# Patient Record
Sex: Male | Born: 1940
Health system: Southern US, Community
[De-identification: ages and names within clinical notes are randomized; demographics above are authoritative.]

## PROBLEM LIST (undated history)

## (undated) DIAGNOSIS — N390 Urinary tract infection, site not specified: Secondary | ICD-10-CM

## (undated) DIAGNOSIS — Z8719 Personal history of other diseases of the digestive system: Secondary | ICD-10-CM

## (undated) DIAGNOSIS — T4145XA Adverse effect of unspecified anesthetic, initial encounter: Secondary | ICD-10-CM

## (undated) DIAGNOSIS — C439 Malignant melanoma of skin, unspecified: Secondary | ICD-10-CM

## (undated) DIAGNOSIS — R636 Underweight: Secondary | ICD-10-CM

## (undated) DIAGNOSIS — H269 Unspecified cataract: Secondary | ICD-10-CM

## (undated) DIAGNOSIS — D649 Anemia, unspecified: Secondary | ICD-10-CM

## (undated) DIAGNOSIS — I499 Cardiac arrhythmia, unspecified: Secondary | ICD-10-CM

## (undated) DIAGNOSIS — H409 Unspecified glaucoma: Secondary | ICD-10-CM

## (undated) DIAGNOSIS — Z9889 Other specified postprocedural states: Secondary | ICD-10-CM

## (undated) DIAGNOSIS — E039 Hypothyroidism, unspecified: Secondary | ICD-10-CM

## (undated) DIAGNOSIS — C434 Malignant melanoma of scalp and neck: Secondary | ICD-10-CM

## (undated) DIAGNOSIS — R112 Nausea with vomiting, unspecified: Secondary | ICD-10-CM

## (undated) DIAGNOSIS — N189 Chronic kidney disease, unspecified: Secondary | ICD-10-CM

## (undated) DIAGNOSIS — I639 Cerebral infarction, unspecified: Secondary | ICD-10-CM

## (undated) DIAGNOSIS — E890 Postprocedural hypothyroidism: Secondary | ICD-10-CM

## (undated) DIAGNOSIS — K219 Gastro-esophageal reflux disease without esophagitis: Secondary | ICD-10-CM

## (undated) DIAGNOSIS — N139 Obstructive and reflux uropathy, unspecified: Secondary | ICD-10-CM

## (undated) DIAGNOSIS — T8859XA Other complications of anesthesia, initial encounter: Secondary | ICD-10-CM

## (undated) HISTORY — DX: Postprocedural hypothyroidism: E89.0

## (undated) HISTORY — PX: EYE SURGERY: SHX253

## (undated) HISTORY — PX: TONSILLECTOMY: SUR1361

## (undated) HISTORY — DX: Cerebral infarction, unspecified: I63.9

## (undated) HISTORY — DX: Underweight: R63.6

## (undated) HISTORY — PX: THYROIDECTOMY: SHX17

## (undated) HISTORY — DX: Urinary tract infection, site not specified: N39.0

## (undated) HISTORY — PX: CATARACT EXTRACTION: SUR2

## (undated) HISTORY — DX: Malignant melanoma of scalp and neck: C43.4

---

## 1997-11-25 ENCOUNTER — Emergency Department (HOSPITAL_COMMUNITY): Admission: EM | Admit: 1997-11-25 | Discharge: 1997-11-25 | Payer: Self-pay | Admitting: Emergency Medicine

## 1999-02-13 ENCOUNTER — Emergency Department (HOSPITAL_COMMUNITY): Admission: EM | Admit: 1999-02-13 | Discharge: 1999-02-13 | Payer: Self-pay | Admitting: Emergency Medicine

## 2003-04-19 ENCOUNTER — Emergency Department (HOSPITAL_COMMUNITY): Admission: EM | Admit: 2003-04-19 | Discharge: 2003-04-19 | Payer: Self-pay | Admitting: Emergency Medicine

## 2008-01-17 ENCOUNTER — Emergency Department (HOSPITAL_COMMUNITY): Admission: EM | Admit: 2008-01-17 | Discharge: 2008-01-17 | Payer: Self-pay | Admitting: Emergency Medicine

## 2008-08-02 ENCOUNTER — Emergency Department (HOSPITAL_COMMUNITY): Admission: EM | Admit: 2008-08-02 | Discharge: 2008-08-02 | Payer: Self-pay | Admitting: Emergency Medicine

## 2010-05-02 LAB — URINALYSIS, ROUTINE W REFLEX MICROSCOPIC
Bilirubin Urine: NEGATIVE
Glucose, UA: NEGATIVE mg/dL
Ketones, ur: NEGATIVE mg/dL
Nitrite: NEGATIVE
Protein, ur: 100 mg/dL — AB
Specific Gravity, Urine: 1.012 (ref 1.005–1.030)
Urobilinogen, UA: 0.2 mg/dL (ref 0.0–1.0)
pH: 6 (ref 5.0–8.0)

## 2010-05-02 LAB — URINE CULTURE
Colony Count: NO GROWTH
Culture: NO GROWTH

## 2010-05-02 LAB — URINE MICROSCOPIC-ADD ON

## 2010-06-08 ENCOUNTER — Inpatient Hospital Stay (HOSPITAL_COMMUNITY)
Admission: EM | Admit: 2010-06-08 | Discharge: 2010-06-12 | DRG: 683 | Disposition: A | Discharge status: Home Health | Payer: Medicare Other | Attending: Internal Medicine | Admitting: Internal Medicine

## 2010-06-08 DIAGNOSIS — E039 Hypothyroidism, unspecified: Secondary | ICD-10-CM | POA: Diagnosis present

## 2010-06-08 DIAGNOSIS — R634 Abnormal weight loss: Secondary | ICD-10-CM | POA: Diagnosis present

## 2010-06-08 DIAGNOSIS — N189 Chronic kidney disease, unspecified: Secondary | ICD-10-CM | POA: Diagnosis present

## 2010-06-08 DIAGNOSIS — N179 Acute kidney failure, unspecified: Principal | ICD-10-CM | POA: Diagnosis present

## 2010-06-08 DIAGNOSIS — D62 Acute posthemorrhagic anemia: Secondary | ICD-10-CM | POA: Diagnosis present

## 2010-06-08 DIAGNOSIS — Z880 Allergy status to penicillin: Secondary | ICD-10-CM

## 2010-06-08 DIAGNOSIS — E872 Acidosis, unspecified: Secondary | ICD-10-CM | POA: Diagnosis present

## 2010-06-08 DIAGNOSIS — N390 Urinary tract infection, site not specified: Secondary | ICD-10-CM | POA: Diagnosis present

## 2010-06-08 DIAGNOSIS — N139 Obstructive and reflux uropathy, unspecified: Secondary | ICD-10-CM | POA: Diagnosis present

## 2010-06-08 DIAGNOSIS — D509 Iron deficiency anemia, unspecified: Secondary | ICD-10-CM | POA: Diagnosis present

## 2010-06-09 ENCOUNTER — Inpatient Hospital Stay (HOSPITAL_COMMUNITY): Payer: Medicare Other

## 2010-06-09 ENCOUNTER — Other Ambulatory Visit: Payer: Self-pay | Admitting: Internal Medicine

## 2010-06-09 LAB — BASIC METABOLIC PANEL
BUN: 38 mg/dL — ABNORMAL HIGH (ref 6–23)
BUN: 37 mg/dL — ABNORMAL HIGH (ref 6–23)
BUN: 38 mg/dL — ABNORMAL HIGH (ref 6–23)
CO2: 18 mEq/L — ABNORMAL LOW (ref 19–32)
CO2: 19 mEq/L (ref 19–32)
CO2: 19 mEq/L (ref 19–32)
Calcium: 8.6 mg/dL (ref 8.4–10.5)
Calcium: 8.3 mg/dL — ABNORMAL LOW (ref 8.4–10.5)
Calcium: 8.2 mg/dL — ABNORMAL LOW (ref 8.4–10.5)
Chloride: 107 mEq/L (ref 96–112)
Chloride: 111 mEq/L (ref 96–112)
Chloride: 112 mEq/L (ref 96–112)
Creatinine, Ser: 4.06 mg/dL — ABNORMAL HIGH (ref 0.4–1.5)
Creatinine, Ser: 3.98 mg/dL — ABNORMAL HIGH (ref 0.4–1.5)
Creatinine, Ser: 4.07 mg/dL — ABNORMAL HIGH (ref 0.4–1.5)
GFR calc Af Amer: 18 mL/min — ABNORMAL LOW (ref >60)
GFR calc Af Amer: 18 mL/min — ABNORMAL LOW (ref >60)
GFR calc Af Amer: 18 mL/min — ABNORMAL LOW (ref >60)
GFR calc non Af Amer: 15 mL/min — ABNORMAL LOW (ref >60)
GFR calc non Af Amer: 15 mL/min — ABNORMAL LOW (ref >60)
GFR calc non Af Amer: 15 mL/min — ABNORMAL LOW (ref >60)
Glucose, Bld: 111 mg/dL — ABNORMAL HIGH (ref 70–99)
Glucose, Bld: 110 mg/dL — ABNORMAL HIGH (ref 70–99)
Glucose, Bld: 174 mg/dL — ABNORMAL HIGH (ref 70–99)
Potassium: 4.8 mEq/L (ref 3.5–5.1)
Potassium: 4.5 mEq/L (ref 3.5–5.1)
Potassium: 4.2 mEq/L (ref 3.5–5.1)
Sodium: 136 mEq/L (ref 135–145)
Sodium: 138 mEq/L (ref 135–145)
Sodium: 140 mEq/L (ref 135–145)

## 2010-06-09 LAB — LIPID PANEL
Cholesterol: 112 mg/dL (ref 0–200)
HDL: 26 mg/dL — ABNORMAL LOW (ref >39)
LDL Cholesterol: 64 mg/dL (ref 0–99)
Total CHOL/HDL Ratio: 4.3 RATIO
Triglycerides: 108 mg/dL (ref <150)
VLDL: 22 mg/dL (ref 0–40)

## 2010-06-09 LAB — IRON AND TIBC
Iron: <10 ug/dL — ABNORMAL LOW (ref 42–135)
UIBC: 203 ug/dL

## 2010-06-09 LAB — CREATININE, URINE, RANDOM: Creatinine, Urine: 63.95 mg/dL

## 2010-06-09 LAB — CBC
HCT: 26.5 % — ABNORMAL LOW (ref 39.0–52.0)
HCT: 23.9 % — ABNORMAL LOW (ref 39.0–52.0)
Hemoglobin: 8.3 g/dL — ABNORMAL LOW (ref 13.0–17.0)
Hemoglobin: 7.6 g/dL — ABNORMAL LOW (ref 13.0–17.0)
MCH: 27.3 pg (ref 26.0–34.0)
MCH: 27.6 pg (ref 26.0–34.0)
MCHC: 31.3 g/dL (ref 30.0–36.0)
MCHC: 31.8 g/dL (ref 30.0–36.0)
MCV: 87.2 fL (ref 78.0–100.0)
MCV: 86.9 fL (ref 78.0–100.0)
Platelets: 219 10*3/uL (ref 150–400)
Platelets: 177 10*3/uL (ref 150–400)
RBC: 3.04 MIL/uL — ABNORMAL LOW (ref 4.22–5.81)
RBC: 2.75 MIL/uL — ABNORMAL LOW (ref 4.22–5.81)
RDW: 14.7 % (ref 11.5–15.5)
RDW: 14.7 % (ref 11.5–15.5)
WBC: 7.7 10*3/uL (ref 4.0–10.5)
WBC: 6.7 10*3/uL (ref 4.0–10.5)

## 2010-06-09 LAB — DIFFERENTIAL
Basophils Absolute: 0 10*3/uL (ref 0.0–0.1)
Basophils Relative: 0 % (ref 0–1)
Eosinophils Absolute: 0.1 10*3/uL (ref 0.0–0.7)
Eosinophils Relative: 2 % (ref 0–5)
Lymphocytes Relative: 16 % (ref 12–46)
Lymphs Abs: 1.2 10*3/uL (ref 0.7–4.0)
Monocytes Absolute: 0.7 10*3/uL (ref 0.1–1.0)
Monocytes Relative: 9 % (ref 3–12)
Neutro Abs: 5.6 10*3/uL (ref 1.7–7.7)
Neutrophils Relative %: 73 % (ref 43–77)

## 2010-06-09 LAB — GLUCOSE, CAPILLARY
Glucose-Capillary: 99 mg/dL (ref 70–99)
Glucose-Capillary: 97 mg/dL (ref 70–99)
Glucose-Capillary: 97 mg/dL (ref 70–99)

## 2010-06-09 LAB — URINE MICROSCOPIC-ADD ON

## 2010-06-09 LAB — URINALYSIS, ROUTINE W REFLEX MICROSCOPIC
Bilirubin Urine: NEGATIVE
Glucose, UA: NEGATIVE mg/dL
Ketones, ur: NEGATIVE mg/dL
Nitrite: NEGATIVE
Protein, ur: >300 mg/dL — AB
Specific Gravity, Urine: 1.017 (ref 1.005–1.030)
Urobilinogen, UA: 0.2 mg/dL (ref 0.0–1.0)
pH: 6.5 (ref 5.0–8.0)

## 2010-06-09 LAB — HEPATIC FUNCTION PANEL
ALT: 7 U/L (ref 0–53)
AST: 11 U/L (ref 0–37)
Albumin: 2.9 g/dL — ABNORMAL LOW (ref 3.5–5.2)
Alkaline Phosphatase: 77 U/L (ref 39–117)
Bilirubin, Direct: <0.1 mg/dL (ref 0.0–0.3)
Total Bilirubin: 0.1 mg/dL — ABNORMAL LOW (ref 0.3–1.2)
Total Protein: 6.8 g/dL (ref 6.0–8.3)

## 2010-06-09 LAB — TSH: TSH: 5.556 u[IU]/mL — ABNORMAL HIGH (ref 0.350–4.500)

## 2010-06-09 LAB — MAGNESIUM: Magnesium: 1.8 mg/dL (ref 1.5–2.5)

## 2010-06-09 LAB — PHOSPHORUS: Phosphorus: 4.2 mg/dL (ref 2.3–4.6)

## 2010-06-09 LAB — FOLATE: Folate: 17.8 ng/mL

## 2010-06-09 LAB — VITAMIN B12: Vitamin B-12: 377 pg/mL (ref 211–911)

## 2010-06-09 LAB — ABO/RH: ABO/RH(D): O POS

## 2010-06-09 LAB — FERRITIN: Ferritin: 357 ng/mL — ABNORMAL HIGH (ref 22–322)

## 2010-06-09 LAB — SODIUM, URINE, RANDOM: Sodium, Ur: 93 mEq/L

## 2010-06-09 NOTE — H&P (Signed)
NAMETOBEY, CAPELLA NO.:  [id number]  MEDICAL RECORD NO.:  [id number]           PATIENT TYPE:  E  LOCATION:  [facility]                         FACILITY:  [facility]  PHYSICIAN:  Rise Patience, MDDATE OF BIRTH:  [birth year]  DATE OF ADMISSION:  06/08/2010 DATE OF DISCHARGE:                             HISTORY & PHYSICAL   PRIMARY CARE PHYSICIAN:  Unassigned.  The patient does not have one.  CHIEF COMPLAINT:  Urinating blood.  HISTORY OF PRESENT ILLNESS:  A 70 year old male with no significant past medical history, was doing fine and until after his work around evening he started urinating blood.  He says it is frank blood and later on he had some clots.  Denies any abdominal pain.  Denies any nausea, vomiting, or diarrhea, has no blood in stools or black stools.  He denies any chest pain or dizziness or loss of consciousness.  He did have a fall yesterday after he tried to cut the limb of tree, but he stated he did not lose consciousness or did not lose consciousness or did not hurt himself.  At this time in the ER, he was found to have a hemoglobin of around 8.3, normocytic normochromic anemia with creatinine of 4 we do not have any baseline creatinine.  At this time, the patient is admitted for further workup.  The patient denies any chest pain or shortness of breath.  He denies any cough or phlegm.  Denies any headache or visual symptoms.  Denies any focal deficit.  The patient stated he does not use any medication including any NSAIDs or any Goody Powder.Marland Kitchen  PAST MEDICAL HISTORY:  Nothing significant.  PAST SURGICAL HISTORY:  None.  MEDICATIONS ON ADMISSION:  None.  SOCIAL HISTORY:  The patient was a former smoker.  Denies any alcohol or drug abuse.  FAMILY HISTORY:  Significant for lung cancer in his dad.  REVIEW OF SYSTEMS:  As per history of present illness nothing else significant.  PHYSICAL EXAMINATION:  GENERAL:  The patient examined at  bedside.  Not in acute distress. VITAL SIGNS:  Blood pressure is 150/60, pulse is 80 per minute, temperature 98, respirations 18, O2 sat is 100% HEENT: Anicteric.  No pallor.  No discharge from ears, eyes, nose or mouth. CHEST:  Bilateral air entry present.  No rhonchi.  No crepitation. HEART:  S1 and S2. ABDOMEN:  Soft, nontender.  Bowel sounds heard.  I do not see any organomegaly.  No discoloration. CNS:  Alert, awake, oriented to time, place, and person. EXTREMITIES:  Moves upper and lower extremities 5/5.  Peripheral pulses felt.  No edema.  LABORATORY DATA:  CBC:  WBC 7.7, hemoglobin 8.3, hematocrit 26.5, platelets 219.  Basic metabolic panel sodium [id number] potassium 4.8, chloride 101, carbon dioxide 18, anion gap is 11, glucose 11, BUN 38, creatinine 4, calcium 8.6.  UA shows large blood, protein more than 300, nitrites negative, leukocytes large, wbc too numerous to count, rbc too numerous to count bacteria few.  ASSESSMENT: 1. Hematuria. 2. Renal failure probably acute on Chronic 3. Non anion gap metabolic acidosis, there was mildly elevated blood  pressure. 4. Normocytic normochromic anemia.  PLAN:  At this time admit the patient to telemetry:. 1. For his renal failure, at this time we will gently hydrate     the patient. We will get urine sodium, urine creatinine, and urine     eosinophil.  At this time, his urine does not show any cast.  I am     going to get a CT of abdomen and pelvis without contrast to look in     for any renal mass or any other pathologies which may also explain     hematuria.If creatinine does not improve we may consult nephrology.     We will place patient on strict intake output and dailyweight. 2. For hematuria, at this time we are going to get urine culture.  The     patient does have some symptoms of frequency.  I am going to place     the patient on ceftriaxone.  The patient may need Urology consult     eventually. 3. Mildly elevated  blood pressure and we will keep the patient on     p.r.n. hydralazine at this time. 4. Further recommendation based on test order and clinical course.     Rise Patience, MD     ANK/MEDQ  D:  06/09/2010  T:  06/09/2010  Job:  [id number]  Electronically Signed by Gean Birchwood MD on 06/09/2010 06:16:58 AM

## 2010-06-09 NOTE — H&P (Signed)
  [name], Keith [name] NO.:  [id number]  MEDICAL RECORD NO.:  [id number]           PATIENT TYPE:  E  LOCATION:  [facility]                         FACILITY:  [facility]  PHYSICIAN:  Rise Patience, MDDATE OF BIRTH:  27-Jul-1940  DATE OF ADMISSION:  06/08/2010 DATE OF DISCHARGE:                             HISTORY & PHYSICAL   ADDENDUM  In addition, the patient also has anemia at this time and anemia is normocytic, normochromic.  The patient denies any black stools or having any blood in the stools or any nausea, vomiting.  The anemia is normocytic, normochromic, probably could be acute and given his gross hematuria, could be a cause of it, but we will check anemia panel, stool for occult blood.  I am going to type and cross match 2 units PRBC and hold.     Rise Patience, MD     ANK/MEDQ  D:  06/09/2010  T:  06/09/2010  Job:  [id number]  Electronically Signed by Gean Birchwood MD on 06/09/2010 06:17:25 AM

## 2010-06-10 ENCOUNTER — Other Ambulatory Visit (HOSPITAL_COMMUNITY): Payer: Medicare Other

## 2010-06-10 LAB — RENAL FUNCTION PANEL
Albumin: 2.8 g/dL — ABNORMAL LOW (ref 3.5–5.2)
BUN: 36 mg/dL — ABNORMAL HIGH (ref 6–23)
CO2: 19 mEq/L (ref 19–32)
Calcium: 8.5 mg/dL (ref 8.4–10.5)
Chloride: 114 mEq/L — ABNORMAL HIGH (ref 96–112)
Creatinine, Ser: 3.87 mg/dL — ABNORMAL HIGH (ref 0.4–1.5)
GFR calc Af Amer: 19 mL/min — ABNORMAL LOW (ref >60)
GFR calc non Af Amer: 16 mL/min — ABNORMAL LOW (ref >60)
Glucose, Bld: 82 mg/dL (ref 70–99)
Phosphorus: 4.3 mg/dL (ref 2.3–4.6)
Potassium: 4.6 mEq/L (ref 3.5–5.1)
Sodium: 141 mEq/L (ref 135–145)

## 2010-06-10 LAB — GLUCOSE, CAPILLARY
Glucose-Capillary: 102 mg/dL — ABNORMAL HIGH (ref 70–99)
Glucose-Capillary: 113 mg/dL — ABNORMAL HIGH (ref 70–99)
Glucose-Capillary: 116 mg/dL — ABNORMAL HIGH (ref 70–99)
Glucose-Capillary: 138 mg/dL — ABNORMAL HIGH (ref 70–99)

## 2010-06-10 LAB — CBC
HCT: 27.6 % — ABNORMAL LOW (ref 39.0–52.0)
Hemoglobin: 8.9 g/dL — ABNORMAL LOW (ref 13.0–17.0)
MCH: 27.7 pg (ref 26.0–34.0)
MCHC: 32.2 g/dL (ref 30.0–36.0)
MCV: 86 fL (ref 78.0–100.0)
Platelets: 152 10*3/uL (ref 150–400)
RBC: 3.21 MIL/uL — ABNORMAL LOW (ref 4.22–5.81)
RDW: 15.2 % (ref 11.5–15.5)
WBC: 6.2 10*3/uL (ref 4.0–10.5)

## 2010-06-10 LAB — URINE CULTURE
Colony Count: NO GROWTH
Culture  Setup Time: 201205241013
Culture: NO GROWTH

## 2010-06-11 LAB — CBC
HCT: 28.8 % — ABNORMAL LOW (ref 39.0–52.0)
Hemoglobin: 9.4 g/dL — ABNORMAL LOW (ref 13.0–17.0)
MCH: 28 pg (ref 26.0–34.0)
MCHC: 32.6 g/dL (ref 30.0–36.0)
MCV: 85.7 fL (ref 78.0–100.0)
Platelets: 157 10*3/uL (ref 150–400)
RBC: 3.36 MIL/uL — ABNORMAL LOW (ref 4.22–5.81)
RDW: 14.9 % (ref 11.5–15.5)
WBC: 6.6 10*3/uL (ref 4.0–10.5)

## 2010-06-11 LAB — RENAL FUNCTION PANEL
Albumin: 2.5 g/dL — ABNORMAL LOW (ref 3.5–5.2)
BUN: 31 mg/dL — ABNORMAL HIGH (ref 6–23)
CO2: 32 mEq/L (ref 19–32)
Calcium: 7.8 mg/dL — ABNORMAL LOW (ref 8.4–10.5)
Chloride: 103 mEq/L (ref 96–112)
Creatinine, Ser: 3.45 mg/dL — ABNORMAL HIGH (ref 0.4–1.5)
GFR calc Af Amer: 22 mL/min — ABNORMAL LOW (ref >60)
GFR calc non Af Amer: 18 mL/min — ABNORMAL LOW (ref >60)
Glucose, Bld: 108 mg/dL — ABNORMAL HIGH (ref 70–99)
Phosphorus: 3.6 mg/dL (ref 2.3–4.6)
Potassium: 4.1 mEq/L (ref 3.5–5.1)
Sodium: 141 mEq/L (ref 135–145)

## 2010-06-11 LAB — CROSSMATCH
ABO/RH(D): O POS
Antibody Screen: NEGATIVE
Unit division: 0
Unit division: 0

## 2010-06-11 LAB — DIFFERENTIAL
Basophils Absolute: 0 10*3/uL (ref 0.0–0.1)
Basophils Relative: 0 % (ref 0–1)
Eosinophils Absolute: 0.2 10*3/uL (ref 0.0–0.7)
Eosinophils Relative: 2 % (ref 0–5)
Lymphocytes Relative: 21 % (ref 12–46)
Lymphs Abs: 1.4 10*3/uL (ref 0.7–4.0)
Monocytes Absolute: 0.8 10*3/uL (ref 0.1–1.0)
Monocytes Relative: 12 % (ref 3–12)
Neutro Abs: 4.3 10*3/uL (ref 1.7–7.7)
Neutrophils Relative %: 65 % (ref 43–77)

## 2010-06-11 LAB — GLUCOSE, CAPILLARY
Glucose-Capillary: 116 mg/dL — ABNORMAL HIGH (ref 70–99)
Glucose-Capillary: 117 mg/dL — ABNORMAL HIGH (ref 70–99)
Glucose-Capillary: 102 mg/dL — ABNORMAL HIGH (ref 70–99)
Glucose-Capillary: 102 mg/dL — ABNORMAL HIGH (ref 70–99)

## 2010-06-12 ENCOUNTER — Inpatient Hospital Stay (HOSPITAL_COMMUNITY): Payer: Medicare Other

## 2010-06-12 LAB — BASIC METABOLIC PANEL
BUN: 27 mg/dL — ABNORMAL HIGH (ref 6–23)
CO2: 26 mEq/L (ref 19–32)
Calcium: 7.4 mg/dL — ABNORMAL LOW (ref 8.4–10.5)
Chloride: 105 mEq/L (ref 96–112)
Creatinine, Ser: 3.13 mg/dL — ABNORMAL HIGH (ref 0.4–1.5)
GFR calc Af Amer: 24 mL/min — ABNORMAL LOW (ref >60)
GFR calc non Af Amer: 20 mL/min — ABNORMAL LOW (ref >60)
Glucose, Bld: 86 mg/dL (ref 70–99)
Potassium: 3.9 mEq/L (ref 3.5–5.1)
Sodium: 140 mEq/L (ref 135–145)

## 2010-06-12 LAB — GLUCOSE, CAPILLARY
Glucose-Capillary: 93 mg/dL (ref 70–99)
Glucose-Capillary: 88 mg/dL (ref 70–99)

## 2010-06-12 LAB — FREE PSA
PSA, Free Pct: 37 % (ref >25)
PSA, Free: 0.3 ng/mL

## 2010-06-12 LAB — PSA: PSA: 0.82 ng/mL (ref <=4.00)

## 2010-06-14 LAB — CK ISOENZYMES
CK-BB: 0 %
CK-MB: 0 % (ref <5)
CK-MM: 100 % (ref 95–100)
Total CK: 67 U/L (ref 7–232)

## 2010-06-15 ENCOUNTER — Other Ambulatory Visit: Payer: Self-pay | Admitting: Family Medicine

## 2010-06-15 DIAGNOSIS — R9389 Abnormal findings on diagnostic imaging of other specified body structures: Secondary | ICD-10-CM

## 2010-06-17 ENCOUNTER — Inpatient Hospital Stay
Admission: RE | Admit: 2010-06-17 | Discharge: 2010-06-17 | Payer: Medicare Other | Source: Ambulatory Visit | Attending: Family Medicine | Admitting: Family Medicine

## 2010-06-20 ENCOUNTER — Ambulatory Visit (HOSPITAL_COMMUNITY)
Admission: RE | Admit: 2010-06-20 | Discharge: 2010-06-20 | Disposition: A | Discharge status: Home / Self Care | Payer: Medicare Other | Source: Ambulatory Visit | Attending: Urology | Admitting: Urology

## 2010-06-20 DIAGNOSIS — N320 Bladder-neck obstruction: Secondary | ICD-10-CM | POA: Insufficient documentation

## 2010-06-20 DIAGNOSIS — N289 Disorder of kidney and ureter, unspecified: Secondary | ICD-10-CM | POA: Insufficient documentation

## 2010-06-20 DIAGNOSIS — R339 Retention of urine, unspecified: Secondary | ICD-10-CM | POA: Insufficient documentation

## 2010-06-20 DIAGNOSIS — N1330 Unspecified hydronephrosis: Secondary | ICD-10-CM | POA: Insufficient documentation

## 2010-06-20 DIAGNOSIS — R319 Hematuria, unspecified: Secondary | ICD-10-CM | POA: Insufficient documentation

## 2010-06-20 DIAGNOSIS — Z79899 Other long term (current) drug therapy: Secondary | ICD-10-CM | POA: Insufficient documentation

## 2010-06-20 DIAGNOSIS — K219 Gastro-esophageal reflux disease without esophagitis: Secondary | ICD-10-CM | POA: Insufficient documentation

## 2010-06-20 LAB — BASIC METABOLIC PANEL
BUN: 31 mg/dL — ABNORMAL HIGH (ref 6–23)
CO2: 23 mEq/L (ref 19–32)
Calcium: 9 mg/dL (ref 8.4–10.5)
Chloride: 106 mEq/L (ref 96–112)
Creatinine, Ser: 2.58 mg/dL — ABNORMAL HIGH (ref 0.4–1.5)
GFR calc Af Amer: 30 mL/min — ABNORMAL LOW (ref >60)
GFR calc non Af Amer: 25 mL/min — ABNORMAL LOW (ref >60)
Glucose, Bld: 91 mg/dL (ref 70–99)
Potassium: 5.2 mEq/L — ABNORMAL HIGH (ref 3.5–5.1)
Sodium: 137 mEq/L (ref 135–145)

## 2010-06-20 LAB — SURGICAL PCR SCREEN
MRSA, PCR: NEGATIVE
Staphylococcus aureus: NEGATIVE

## 2010-06-20 NOTE — Consult Note (Signed)
NAMELANDON, Keith Keith [name].:  [id number]  MEDICAL RECORD Keith [name].:  [id number]           PATIENT TYPE:  I  LOCATION:  [id number]                         FACILITY:  [facility]  PHYSICIAN:  Keith Bring, MD      DATE OF BIRTH:  07/09/1940  DATE OF CONSULTATION:  06/09/2010 DATE OF DISCHARGE:                                CONSULTATION   REASON FOR CONSULTATION:  Gross hematuria and renal dysfunction.  PHYSICIAN REQUESTING CONSULTATION:  Dr. Grandville Silos with [facility].  HISTORY:  [name] is a 70 year old gentleman who developed painless gross hematuria last evening, which caused him to present to the emergency department.  He denies any significant prior history of hematuria except approximately 2 years ago when he was diagnosed and treated with a urinary tract infection.  He states that he does not have any longstanding bothersome urinary complaints, although had noted some nocturnal incontinence over the past 3-4 weeks, which was of new onset. He otherwise has been quite healthy and does not have a regular physician and has not seen a physician in many years.  He personally denies a history of GU malignancy and has Keith [name] family history of GU malignancy.  He did smoke one pack of cigarettes per day for 20 years and quit in his mid 82s.  He denies a history of prior urolithiasis, STDs, prostatitis, GU malignancy, or trauma.  He does have a history of a partial left orchiectomy, which was apparently performed due to infectious causes back in the 1970s.  PAST MEDICAL HISTORY:  None.  PAST SURGICAL HISTORY: 1. Left partial orchiectomy. 2. Foot surgery.  MEDICATIONS:  Keith [name] home medications.  ALLERGIES:  PENICILLIN.  FAMILY HISTORY:  Keith [name] GU malignancy.  SOCIAL HISTORY:  He did smoke one pack of cigarettes per day for 20 years and quit in his early 36s.  He denies alcohol use.  REVIEW OF SYSTEMS:  Complete review of systems was performed.  All pertinent findings are as  noted in the history of present illness.  All other systems are reviewed and are negative.  PHYSICAL EXAMINATION:  VITAL SIGNS:  Temperature 97.7, pulse 71, blood pressure 125/49. CONSTITUTIONAL:  Well-nourished, well-developed, age-appropriate male in Keith [name] acute distress. HEENT:  Normocephalic, atraumatic. NECK:  Supple without lymphadenopathy or JVD. CARDIOVASCULAR:  Regular rate and rhythm. LUNGS:  Clear bilaterally. ABDOMEN:  Soft, nontender, nondistended without abdominal masses or bruits. BACK:  Keith [name] CVA tenderness. GU:  Normal circumcised male phallus with an indwelling Foley catheter with grossly bloody urine.  Testes are descended bilaterally and are nontender without masses. DRE:  Normal sphincter tone.  Prostate is approximately 60 g without discrete nodularity.  There is some mild induration toward the left base of the prostate medially. EXTREMITIES:  Keith [name] edema. NEUROLOGIC:  Grossly intact.  LABORATORY DATA:  Serum creatinine on admission was 4.06 with an unknown baseline creatinine.  A repeat creatinine earlier this morning was 3.98. Both of these levels were performed prior to catheter placement. Hemoglobin 7.6.  He has received 2 units of packed red blood cells.  RADIOLOGIC IMAGING:  I independently reviewed his CT scan of the  abdomen and pelvis without contrast.  He does have bilateral hydronephrosis with a distended bladder consistent with bladder outlet obstruction.  He also has evidence of a median prostatic lobes.  There is some mild periaortic lymphadenopathy with a left periaortic lymph node measuring 12 mm and another periaortic lymph node measuring 11 mm, which is nonspecific, but slightly pathologically enlarged.  He does not have any evidence of urolithiasis or other explanations for his hematuria.  IMPRESSION AND RECOMMENDATIONS: 1. Gross hematuria:  I am unsure of the etiology of his hematuria at     this point.  He will require further evaluation, which  may include     contrasted imaging depending on whether his renal function improves     following catheter placement.  Otherwise, he may require retrograde     pyelography to complete his upper tract evaluation.  He also will     require cystoscopy either in the office or possibly in the     operative setting depending on whether his urine clears with     conservative management.  While his anemia certainly may be related     to his gross hematuria, this would certainly be unusual especially     considering the acute onset.  I would certainly consider other     causes for his anemia, such as chronic kidney disease. 2. Renal dysfunction:  I am unsure whether this is chronic or acute.     His renal dysfunction certainly may be related to bladder outlet     obstruction based on his presentation, and his renal function     should be monitored with his catheter now in place leaving his     bladder outlet obstruction.  He also should be monitored for a     possible postobstructive diuresis.     Keith Bring, MD     LB/MEDQ  D:  06/09/2010  T:  06/10/2010  Job:  [id number]  cc:   Rise Patience, MD  Electronically Signed by Keith Bring MD on 06/20/2010 08:09:19 PM

## 2010-06-21 ENCOUNTER — Other Ambulatory Visit: Payer: Medicare Other

## 2010-06-22 NOTE — Op Note (Signed)
Keith [name], MACIOCE NO.:  [id number]  MEDICAL RECORD NO.:  [id number]  LOCATION:  DAY                          FACILITY:  [facility]  PHYSICIAN:  Raynelle Bring, MD      DATE OF BIRTH:  01-Mar-1940  DATE OF PROCEDURE:  06/20/2010 DATE OF DISCHARGE:                              OPERATIVE REPORT   PREOPERATIVE DIAGNOSES: 1. Hematuria. 2. Renal insufficiency. 3. Bilateral hydronephrosis. 4. Urinary retention.  POSTOPERATIVE DIAGNOSES: 1. Hematuria. 2. Renal insufficiency. 3. Bilateral hydronephrosis. 4. Urinary retention.  PROCEDURES: 1. Cystoscopy. 2. Bilateral retrograde pyelography with interpretation. 3. Bilateral ureteral stent placement (right 6 x 28, left 6 x 26).  INTRAOPERATIVE FINDINGS:  Bilateral retrograde pyelography demonstrated bilateral hydroureteronephrosis down to the level of the ureterovesical junctions.  There was no evidence of any filling defects or other abnormalities.  SURGEON:  Raynelle Bring, MD  ANESTHESIA:  General.  COMPLICATIONS:  None.  INDICATION:  Mr. Boulet is a 70 year old gentleman who was recently found to have gross hematuria and admitted to the hospital and also found to have renal insufficiency with a creatinine of 4.  No baseline creatinine was available due to the fact that the patient had not received medical care over a decade.  His creatinine improved gradually with Foley catheter placement, although nadired at about 2.7.  Repeat upper tract imaging demonstrated persistent bilateral hydronephrosis. His initial evaluation did not reveal any obvious cause for his hematuria and specifically no evidence of urolithiasis or bladder tumors.  Due to the fact that he had persistent hydronephrosis, it was recommended that he proceed to the operating room for the above procedures and further evaluation of possible upper tract obstruction. The potential risks, complications, and alternative treatment  options associated with the above procedures were discussed in detail and informed consent obtained.  DESCRIPTION OF PROCEDURE:  The patient was taken to the operating room and a general anesthetic was administered.  He was given preoperative antibiotics, placed in the dorsal lithotomy position, and prepped and draped in the usual sterile fashion.  Next, preoperative time-out was performed.  Cystourethroscopy was performed which revealed a normal anterior urethra.  The prostatic urethra was notable for a high bladder neck as well as lateral lobe hypertrophy and the prostatic urethra was noted to be significantly friable with fairly profuse bleeding upon entering the bladder.  However, there were no bleeding sites within the bladder.  There was expected edema throughout the bladder, consistent with his history of an indwelling Foley catheter.  No bladder tumors or other abnormalities were identified.  Of note, his cytology from the office revealed no malignant cells.  The ureteral orifices were then identified in their expected anatomic location.  The left ureteral orifice was intubated with a 6-French ureteral catheter and Omnipaque contrast was injected.  This revealed a dilated ureter and dilated renal collecting system, consistent with obstruction at the ureterovesical junction without filling defects or other abnormalities.  A 0.038 Glidewire was then able to be manipulated up into the left renal collecting system under fluoroscopic guidance.  A 6-French ureteral catheter was placed up into the renal pelvis and again Omnipaque contrast was injected which further confirmed no evidence  of any filling defects or other abnormalities except for dilation of the renal pelvis and caliceal system.  A 0.038 sensor guidewire was then advanced through the ureteral catheter into the left renal pelvis.  This wire was then back loaded onto the cystoscope and a 6 x 26 double-J ureteral stent  was advanced over the wire using Seldinger technique and positioned appropriately under fluoroscopic and cystoscopic guidance.  A good curl was noted in the renal pelvis as well as in the bladder after the wire was removed.  Attention then turned to the right ureteral orifice.  It also was able to be cannulated with a 6-French ureteral catheter and Omnipaque contrast injected with findings similar to the contralateral side.  A similar procedure was used to place a 6 x 28 double-J ureteral stent on the right side.  In initial attempt at placement of the 6 x 26 found this stent to be too short and so it had to be removed and replaced with a 6 x 28 double-J ureteral stent.  The patient tolerated the procedure well without complications.  An indwelling 18-French Foley catheter was left at the end of the procedure to minimize any complications related to the prostatic bleeding.  He tolerated the procedure well without complications and was able to be transferred to the recovery unit in satisfactory condition.  PLAN:  He will plan to follow up in approximately 2 weeks for further evaluation including rechecking his renal function and to discuss definitive treatment of his urinary tract obstruction.     Raynelle Bring, MD     LB/MEDQ  D:  06/20/2010  T:  06/21/2010  Job:  [id number]  cc:   Posey Boyer, MD  Electronically Signed by Raynelle Bring MD on 06/22/2010 10:47:04 PM

## 2010-06-27 NOTE — Discharge Summary (Signed)
Keith [name], Keith [name] NO.:  [id number]  MEDICAL RECORD NO.:  [id number]           PATIENT TYPE:  I  LOCATION:  [id number]                         FACILITY:  [facility]  PHYSICIAN:  Edythe Lynn, M.D.       DATE OF BIRTH:  10-04-1940  DATE OF ADMISSION:  06/08/2010 DATE OF DISCHARGE:  06/12/2010                              DISCHARGE SUMMARY   PRIMARY CARE PHYSICIAN:  This patient has been referred to [facility] as a new patient for primary care services.  DISCHARGE DIAGNOSES: 1. Acute renal failure, felt to be due to obstructive uropathy, workup     still in progress per Dr. Raynelle Bring with Urology. 2. Anemia with features consistent with chronic disease and iron     deficiency - The patient needs outpatient colonoscopy. 3. Pathological weight loss of unclear etiology - This patient is to     be ruled out for urological malignancy. 4. Non-anion gap metabolic acidosis, resolved with correction of the     acute renal failure. 5. Probable urinary tract infection. 6. Newly diagnosed hypothyroidism.  DISCHARGE MEDICATIONS: 1. Doxazosin 1 mg by mouth at bedtime. 2. Tylenol 650 mg every 4 hours needed for pain. 3. Cefuroxime 500 mg twice a day for 4 more days. 4. Levothyroxine 12.5 mcg daily.  CONDITION AT DISCHARGE:  Mr. Omori was discharged in good condition, hemodynamically stable.  Temperature 97.7 heart rate 66, respirations 18, blood pressure 139/46, and saturation 98% on room air.  The patient's discharge basic metabolic profile reflects a creatinine of 3.1, BUN of 27, bicarbonate of 26.  The patient will follow up with Dr. Raynelle Bring from Urology and with [facility].  The patient was discharged home with a Foley catheter with leg bags.  PROCEDURE DONE DURING THIS ADMISSION: 1. On Jun 09, 2010, the patient underwent CT scan of the abdomen and     pelvis, finding of hydronephrosis and hydroureter,  distended     bladder consistent with bladder outlet obstruction, nodular     prostate gland, left periaortic retroperitoneal lymphadenopathy     nonspecific. 2. On Jun 12, 2010, repeat renal ultrasound showing some mild     hydronephrosis improved from the prior study, echogenic material     within the bladder, probably small clot, Foley catheter in place.  CONSULTATION DURING THIS ADMISSION:  The patient was seen in consultation by Dr. Raynelle Bring from Urology.  HISTORY AND PHYSICAL:  Refer to dictated H and P done by Dr. Gean Birchwood .  HOSPITAL COURSE:  Mr. Rudloff is a 70 year old gentleman without any known past medical problems presented to emergency room for complaints of hematuria.  In the emergency room, the patient was found to have acute renal failure and it was noted he was actually unable to urinate. An emergent urological consultation helped pass a Foley catheter and dark bloody urine was washed out.  The specimen was sent for cultures. The patient was started on empiric antibiotics using intravenous Rocephin.  The CT scan of abdomen and pelvis raised possibility of a prostatic malignancy.  PSA level was though very low at 0.82, not suggesting prostate cancer.  Nevertheless, given the fact that this patient has an obstructive uropathy causing acute renal failure that may have degenerative chronic kidney disease, it is advisable that he has outpatient urological follow up with consideration for cystoscopy and biopsies of the bladder.  The patient was treated with intravenous Rocephin which was later switched to oral cefuroxime and even though the urologist recommended oral Flomax due to the fact the patient cannot afford this medication, he was prescribed oral doxazosin which is generic and he can afford.  The patient will follow up in the office of the urologist for voiding trials and to complete the workup for his obstructive uropathy.  During this admission,  the patient was noted to have anemia.  His anemia was investigated with anemia panel which suggested he has a combination of anemia of chronic kidney disease and iron deficiency.  The patient is to take oral iron supplements in the outpatient setting and he will follow up with the [facility] for new primary care purposes.  At that point in time, he will required to be set up for an outpatient colonoscopy.  During this hospitalization, the acute renal failure that the patient presented with improved with intravenous fluids.  His creatinine went from a level of 4.0 at the time of the admission to a level of 3.1 at the time of discharge.  The patient has no signs of uremia and he can be safely followed up in the outpatient setting regarding this problem.     Edythe Lynn, M.D.     SL/MEDQ  D:  06/13/2010  T:  06/14/2010  Job:  [id number]  cc:   Raynelle Bring, MD [facility]  Electronically Signed by Edythe Lynn M.D. on 06/27/2010 09:35:40 AM

## 2010-07-08 ENCOUNTER — Inpatient Hospital Stay (HOSPITAL_COMMUNITY): Payer: Medicare Other

## 2010-07-08 ENCOUNTER — Emergency Department (HOSPITAL_COMMUNITY): Payer: Medicare Other

## 2010-07-08 ENCOUNTER — Inpatient Hospital Stay (HOSPITAL_COMMUNITY)
Admission: EM | Admit: 2010-07-08 | Discharge: 2010-07-13 | DRG: 683 | Disposition: A | Discharge status: Home / Self Care | Payer: Medicare Other | Attending: Internal Medicine | Admitting: Internal Medicine

## 2010-07-08 DIAGNOSIS — N320 Bladder-neck obstruction: Secondary | ICD-10-CM | POA: Diagnosis present

## 2010-07-08 DIAGNOSIS — E872 Acidosis, unspecified: Secondary | ICD-10-CM | POA: Diagnosis present

## 2010-07-08 DIAGNOSIS — N401 Enlarged prostate with lower urinary tract symptoms: Secondary | ICD-10-CM | POA: Diagnosis present

## 2010-07-08 DIAGNOSIS — N179 Acute kidney failure, unspecified: Principal | ICD-10-CM | POA: Diagnosis present

## 2010-07-08 DIAGNOSIS — D696 Thrombocytopenia, unspecified: Secondary | ICD-10-CM | POA: Diagnosis present

## 2010-07-08 DIAGNOSIS — Z87891 Personal history of nicotine dependence: Secondary | ICD-10-CM

## 2010-07-08 DIAGNOSIS — J4489 Other specified chronic obstructive pulmonary disease: Secondary | ICD-10-CM | POA: Diagnosis present

## 2010-07-08 DIAGNOSIS — I498 Other specified cardiac arrhythmias: Secondary | ICD-10-CM | POA: Diagnosis present

## 2010-07-08 DIAGNOSIS — J449 Chronic obstructive pulmonary disease, unspecified: Secondary | ICD-10-CM | POA: Diagnosis present

## 2010-07-08 DIAGNOSIS — R3129 Other microscopic hematuria: Secondary | ICD-10-CM | POA: Diagnosis present

## 2010-07-08 DIAGNOSIS — Z79899 Other long term (current) drug therapy: Secondary | ICD-10-CM

## 2010-07-08 DIAGNOSIS — N1330 Unspecified hydronephrosis: Secondary | ICD-10-CM | POA: Diagnosis present

## 2010-07-08 DIAGNOSIS — D631 Anemia in chronic kidney disease: Secondary | ICD-10-CM | POA: Diagnosis present

## 2010-07-08 DIAGNOSIS — N184 Chronic kidney disease, stage 4 (severe): Secondary | ICD-10-CM | POA: Diagnosis present

## 2010-07-08 DIAGNOSIS — E860 Dehydration: Secondary | ICD-10-CM | POA: Diagnosis present

## 2010-07-08 DIAGNOSIS — I951 Orthostatic hypotension: Secondary | ICD-10-CM | POA: Diagnosis present

## 2010-07-08 DIAGNOSIS — E039 Hypothyroidism, unspecified: Secondary | ICD-10-CM | POA: Diagnosis present

## 2010-07-08 DIAGNOSIS — R911 Solitary pulmonary nodule: Secondary | ICD-10-CM | POA: Diagnosis present

## 2010-07-08 DIAGNOSIS — R259 Unspecified abnormal involuntary movements: Secondary | ICD-10-CM | POA: Diagnosis not present

## 2010-07-08 DIAGNOSIS — N138 Other obstructive and reflux uropathy: Secondary | ICD-10-CM | POA: Diagnosis present

## 2010-07-08 LAB — CARDIAC PANEL(CRET KIN+CKTOT+MB+TROPI)
CK, MB: 2.1 ng/mL (ref 0.3–4.0)
Relative Index: 1.7 (ref 0.0–2.5)
Total CK: 125 U/L (ref 7–232)
Troponin I: <0.3 ng/mL (ref <0.30)

## 2010-07-08 LAB — PROTIME-INR
INR: 1.31 (ref 0.00–1.49)
Prothrombin Time: 16.5 seconds — ABNORMAL HIGH (ref 11.6–15.2)

## 2010-07-08 LAB — COMPREHENSIVE METABOLIC PANEL
ALT: 6 U/L (ref 0–53)
AST: 9 U/L (ref 0–37)
Albumin: 3 g/dL — ABNORMAL LOW (ref 3.5–5.2)
Alkaline Phosphatase: 68 U/L (ref 39–117)
BUN: 43 mg/dL — ABNORMAL HIGH (ref 6–23)
CO2: 22 mEq/L (ref 19–32)
Calcium: 8.8 mg/dL (ref 8.4–10.5)
Chloride: 100 mEq/L (ref 96–112)
Creatinine, Ser: 3.72 mg/dL — ABNORMAL HIGH (ref 0.50–1.35)
GFR calc Af Amer: 20 mL/min — ABNORMAL LOW (ref >60)
GFR calc non Af Amer: 16 mL/min — ABNORMAL LOW (ref >60)
Glucose, Bld: 140 mg/dL — ABNORMAL HIGH (ref 70–99)
Potassium: 4.8 mEq/L (ref 3.5–5.1)
Sodium: 132 mEq/L — ABNORMAL LOW (ref 135–145)
Total Bilirubin: 0.4 mg/dL (ref 0.3–1.2)
Total Protein: 7.2 g/dL (ref 6.0–8.3)

## 2010-07-08 LAB — CBC
HCT: 29.8 % — ABNORMAL LOW (ref 39.0–52.0)
Hemoglobin: 9.7 g/dL — ABNORMAL LOW (ref 13.0–17.0)
MCH: 28.1 pg (ref 26.0–34.0)
MCHC: 32.6 g/dL (ref 30.0–36.0)
MCV: 86.4 fL (ref 78.0–100.0)
Platelets: 125 10*3/uL — ABNORMAL LOW (ref 150–400)
RBC: 3.45 MIL/uL — ABNORMAL LOW (ref 4.22–5.81)
RDW: 14.6 % (ref 11.5–15.5)
WBC: 13 10*3/uL — ABNORMAL HIGH (ref 4.0–10.5)

## 2010-07-08 LAB — URINALYSIS, ROUTINE W REFLEX MICROSCOPIC
Bilirubin Urine: NEGATIVE
Glucose, UA: NEGATIVE mg/dL
Ketones, ur: NEGATIVE mg/dL
Nitrite: NEGATIVE
Protein, ur: >300 mg/dL — AB
Specific Gravity, Urine: 1.013 (ref 1.005–1.030)
Urobilinogen, UA: 0.2 mg/dL (ref 0.0–1.0)
pH: 6 (ref 5.0–8.0)

## 2010-07-08 LAB — DIFFERENTIAL
Basophils Absolute: 0 10*3/uL (ref 0.0–0.1)
Basophils Relative: 0 % (ref 0–1)
Eosinophils Absolute: 0 10*3/uL (ref 0.0–0.7)
Eosinophils Relative: 0 % (ref 0–5)
Lymphocytes Relative: 5 % — ABNORMAL LOW (ref 12–46)
Lymphs Abs: 0.6 10*3/uL — ABNORMAL LOW (ref 0.7–4.0)
Monocytes Absolute: 1.6 10*3/uL — ABNORMAL HIGH (ref 0.1–1.0)
Monocytes Relative: 12 % (ref 3–12)
Neutro Abs: 10.8 10*3/uL — ABNORMAL HIGH (ref 1.7–7.7)
Neutrophils Relative %: 83 % — ABNORMAL HIGH (ref 43–77)

## 2010-07-08 LAB — URINE MICROSCOPIC-ADD ON

## 2010-07-08 LAB — APTT: aPTT: 45 seconds — ABNORMAL HIGH (ref 24–37)

## 2010-07-09 DIAGNOSIS — I359 Nonrheumatic aortic valve disorder, unspecified: Secondary | ICD-10-CM

## 2010-07-09 LAB — COMPREHENSIVE METABOLIC PANEL
ALT: 9 U/L (ref 0–53)
AST: 13 U/L (ref 0–37)
Albumin: 2.6 g/dL — ABNORMAL LOW (ref 3.5–5.2)
Alkaline Phosphatase: 59 U/L (ref 39–117)
BUN: 41 mg/dL — ABNORMAL HIGH (ref 6–23)
CO2: 18 mEq/L — ABNORMAL LOW (ref 19–32)
Calcium: 8.4 mg/dL (ref 8.4–10.5)
Chloride: 107 mEq/L (ref 96–112)
Creatinine, Ser: 3.78 mg/dL — ABNORMAL HIGH (ref 0.50–1.35)
GFR calc Af Amer: 19 mL/min — ABNORMAL LOW (ref >60)
GFR calc non Af Amer: 16 mL/min — ABNORMAL LOW (ref >60)
Glucose, Bld: 113 mg/dL — ABNORMAL HIGH (ref 70–99)
Potassium: 4.5 mEq/L (ref 3.5–5.1)
Sodium: 137 mEq/L (ref 135–145)
Total Bilirubin: 0.3 mg/dL (ref 0.3–1.2)
Total Protein: 6.5 g/dL (ref 6.0–8.3)

## 2010-07-09 LAB — CARDIAC PANEL(CRET KIN+CKTOT+MB+TROPI)
CK, MB: 2.1 ng/mL (ref 0.3–4.0)
CK, MB: 2.2 ng/mL (ref 0.3–4.0)
Relative Index: 1.1 (ref 0.0–2.5)
Relative Index: 1 (ref 0.0–2.5)
Total CK: 193 U/L (ref 7–232)
Total CK: 229 U/L (ref 7–232)
Troponin I: <0.3 ng/mL (ref <0.30)
Troponin I: <0.3 ng/mL (ref <0.30)

## 2010-07-09 LAB — CBC
HCT: 27.9 % — ABNORMAL LOW (ref 39.0–52.0)
Hemoglobin: 9.2 g/dL — ABNORMAL LOW (ref 13.0–17.0)
MCH: 28.5 pg (ref 26.0–34.0)
MCHC: 33 g/dL (ref 30.0–36.0)
MCV: 86.4 fL (ref 78.0–100.0)
Platelets: 113 10*3/uL — ABNORMAL LOW (ref 150–400)
RBC: 3.23 MIL/uL — ABNORMAL LOW (ref 4.22–5.81)
RDW: 14.6 % (ref 11.5–15.5)
WBC: 10.1 10*3/uL (ref 4.0–10.5)

## 2010-07-09 LAB — T4, FREE: Free T4: 0.74 ng/dL — ABNORMAL LOW (ref 0.80–1.80)

## 2010-07-09 LAB — TSH: TSH: 1.939 u[IU]/mL (ref 0.350–4.500)

## 2010-07-09 NOTE — Consult Note (Signed)
Keith [name], Keith [name] NO.:  [id number]  MEDICAL RECORD NO.:  [id number]  LOCATION:  [id number]                         FACILITY:  [facility]  PHYSICIAN:  Barb Merino, M.D.  DATE OF BIRTH:  [birth year]  DATE OF CONSULTATION:  07/09/2010 DATE OF DISCHARGE:                                CONSULTATION   REASON FOR CONSULTATION:  Elevated BUN and creatinine, history of bilateral hydronephrosis.  The patient is 70 year old male who came to the emergency room yesterday complaining of dizziness, nausea.  He was started on Rapaflo on June 19. He was voiding well.  The patient was seen by Dr. Alinda Money for elevated BUN and creatinine and gross hematuria in May.  Noncontrast CT scan showed no renal mass,  bilateral hydronephrosis.  His creatinine was over 4.  A Foley catheter was inserted in the bladder and his hydronephrosis improved.  However, his creatinine remained elevated. Because of persistence of the hydronephrosis, he had a cystoscopy and bilateral retrograde pyelogram which showed dilated ureters down to the level of the ureterovesical junction.  Bilateral ureteral stents were placed.  His creatinine was 2.58 on June 4.  The Foley catheter was removed on June 19 and he was then started on Rapaflo and then he started having symptoms of diarrhea, nausea, and dizziness.  He was given another 8 mg of Rapaflo yesterday and he feels that the symptoms recurred and he thinks that they are due to Rapaflo.   He has an indwelling Foley catheter now that is draining clear urine.  Renal ultrasound showed mild bilateral hydronephrosis decreased since the prior study.  His creatinine yesterday was 3.72 and BUN 43.  PAST MEDICAL HISTORY:  Positive for esophageal reflux, renal insufficiency, bilateral hydronephrosis, hypothyroidism.  MEDICATIONS: 1. Doxazosin 1 mg. 2. Levothyroxine 25 mcg. 3. Rapaflo 8 mg.  ALLERGIES:  PENICILLIN.  PAST SURGICAL HISTORY:  He had cystoscopy and  insertion of bilateral ureteral stents on June 21, 2010.  FAMILY HISTORY:  His father died of lung cancer.  His mother died of Alzheimer disease.  SOCIAL HISTORY:  He is married.  Quit smoking several years ago and does not drink.  REVIEW OF SYSTEMS:  As noted in the HPI and everything else is negative.  PHYSICAL EXAMINATION:  GENERAL:  This is a well-developed 70 year old male who is currently in no acute distress.  He is alert and oriented to time, place, and person. VITAL  SIGNS:  Blood pressure is 123/42, pulse 96, respirations 16, temperature 100.2. SKIN:  Warm and dry. ABDOMEN:  Soft, nondistended, nontender.  He has no CVA tenderness. GENITOURINARY:  Kidneys are not palpable.  Bladder is not distended.  He has no inguinal hernia.  No inguinal adenopathy.  He has an indwelling Foley catheter that is draining clear urine.  His scrotum is normal. There is no testicular mass.  Cords and epididymis are within normal limits. EXTREMITIES:  Within normal limits. RECTAL:  Deferred.  His hemoglobin today is 9.2, hematocrit 27.9, and WBC 10.1.  IMPRESSION:  Bladder prostatic hypertrophy, bladder outlet obstruction, bilateral hydronephrosis, renal insufficiency, hypothyroidism.  SUGGESTIONS:  Discontinue Rapaflo, increase doxazosin dose up to 4 mg daily at bedtime, recheck BUN and  creatinine.  Thank you.  We will follow the patient with you.     Arvil Persons, M.D.     MN/MEDQ  D:  07/09/2010  T:  07/09/2010  Job:  [id number]  Electronically Signed by Hanley Ben M.D. on 07/09/2010 02:06:05 PM

## 2010-07-10 LAB — BASIC METABOLIC PANEL
BUN: 38 mg/dL — ABNORMAL HIGH (ref 6–23)
CO2: 20 mEq/L (ref 19–32)
Calcium: 8.1 mg/dL — ABNORMAL LOW (ref 8.4–10.5)
Chloride: 111 mEq/L (ref 96–112)
Creatinine, Ser: 3.98 mg/dL — ABNORMAL HIGH (ref 0.50–1.35)
GFR calc Af Amer: 18 mL/min — ABNORMAL LOW (ref >60)
GFR calc non Af Amer: 15 mL/min — ABNORMAL LOW (ref >60)
Glucose, Bld: 103 mg/dL — ABNORMAL HIGH (ref 70–99)
Potassium: 4.3 mEq/L (ref 3.5–5.1)
Sodium: 138 mEq/L (ref 135–145)

## 2010-07-10 LAB — CBC
HCT: 24.6 % — ABNORMAL LOW (ref 39.0–52.0)
Hemoglobin: 8 g/dL — ABNORMAL LOW (ref 13.0–17.0)
MCH: 28.4 pg (ref 26.0–34.0)
MCHC: 32.5 g/dL (ref 30.0–36.0)
MCV: 87.2 fL (ref 78.0–100.0)
Platelets: 99 10*3/uL — ABNORMAL LOW (ref 150–400)
RBC: 2.82 MIL/uL — ABNORMAL LOW (ref 4.22–5.81)
RDW: 14.9 % (ref 11.5–15.5)
WBC: 6 10*3/uL (ref 4.0–10.5)

## 2010-07-10 LAB — IRON AND TIBC
Iron: 16 ug/dL — ABNORMAL LOW (ref 42–135)
Saturation Ratios: 11 % — ABNORMAL LOW (ref 20–55)
TIBC: 148 ug/dL — ABNORMAL LOW (ref 215–435)
UIBC: 132 ug/dL

## 2010-07-10 LAB — FOLATE: Folate: 16.7 ng/mL

## 2010-07-10 LAB — HEPATITIS B CORE ANTIBODY, IGM: Hep B C IgM: NEGATIVE

## 2010-07-10 LAB — ANGIOTENSIN CONVERTING ENZYME: Angiotensin-Converting Enzyme: 16 U/L (ref 8–52)

## 2010-07-10 LAB — VITAMIN B12: Vitamin B-12: 297 pg/mL (ref 211–911)

## 2010-07-11 ENCOUNTER — Other Ambulatory Visit: Payer: Self-pay | Admitting: Internal Medicine

## 2010-07-11 LAB — BASIC METABOLIC PANEL
BUN: 34 mg/dL — ABNORMAL HIGH (ref 6–23)
CO2: 18 mEq/L — ABNORMAL LOW (ref 19–32)
Calcium: 8 mg/dL — ABNORMAL LOW (ref 8.4–10.5)
Chloride: 110 mEq/L (ref 96–112)
Creatinine, Ser: 3.7 mg/dL — ABNORMAL HIGH (ref 0.50–1.35)
GFR calc Af Amer: 20 mL/min — ABNORMAL LOW (ref >60)
GFR calc non Af Amer: 16 mL/min — ABNORMAL LOW (ref >60)
Glucose, Bld: 93 mg/dL (ref 70–99)
Potassium: 4.1 mEq/L (ref 3.5–5.1)
Sodium: 137 mEq/L (ref 135–145)

## 2010-07-11 LAB — PROTEIN / CREATININE RATIO, URINE
Creatinine, Urine: 54.19 mg/dL
Protein Creatinine Ratio: 1.7 — ABNORMAL HIGH (ref 0.00–0.15)
Total Protein, Urine: 91.9 mg/dL

## 2010-07-11 LAB — FERRITIN: Ferritin: 3050 ng/mL — ABNORMAL HIGH (ref 22–322)

## 2010-07-11 LAB — CBC
HCT: 23.6 % — ABNORMAL LOW (ref 39.0–52.0)
Hemoglobin: 7.7 g/dL — ABNORMAL LOW (ref 13.0–17.0)
MCH: 28.2 pg (ref 26.0–34.0)
MCHC: 32.6 g/dL (ref 30.0–36.0)
MCV: 86.4 fL (ref 78.0–100.0)
Platelets: 104 10*3/uL — ABNORMAL LOW (ref 150–400)
RBC: 2.73 MIL/uL — ABNORMAL LOW (ref 4.22–5.81)
RDW: 15.1 % (ref 11.5–15.5)
WBC: 4.6 10*3/uL (ref 4.0–10.5)

## 2010-07-11 LAB — HEPATITIS B SURFACE ANTIBODY,QUALITATIVE: Hep B S Ab: NEGATIVE

## 2010-07-11 LAB — HEPATITIS B SURFACE ANTIGEN: Hepatitis B Surface Ag: NEGATIVE

## 2010-07-11 LAB — POCT OCCULT BLOOD STOOL (DEVICE): Fecal Occult Bld: NEGATIVE

## 2010-07-11 LAB — PHOSPHORUS: Phosphorus: 3.2 mg/dL (ref 2.3–4.6)

## 2010-07-11 NOTE — Consult Note (Signed)
Keith [name], Keith [name] NO.:  [id number]  MEDICAL RECORD NO.:  [id number]  LOCATION:  [id number]                         FACILITY:  [facility]  PHYSICIAN:  Sol Blazing, M.D.DATE OF BIRTH:  [birth year]  DATE OF CONSULTATION: DATE OF DISCHARGE:                                CONSULTATION   REASON FOR CONSULT:  Elevated creatinine.  REFERRING PHYSICIAN:  Bonnielee Haff, MD  HISTORY:  This is a 70 year old white male previous smoker, healthy with no other chronic medical conditions until he developed gross hematuria in late May 2012.  He was admitted at this facility on Jun 09, 2010, through Jun 14, 2010, for renal failure with a creatinine of 4.0, bilateral hydronephrosis and anemia with hemoglobin of 8.3.  His abdominal CT showed bilateral hydronephrosis and hydroureter, enlarged prostate, and distended bladder.  A Foley catheter was placed.  He received a blood transfusion and he was discharged with Foley catheter in place on Jun 14, 2010, with creatinine of 3.1.  On June 20, 2010, creatinine was 2.5 and urology did an outpatient cystoscopy which showed persistent bilateral hydroureter, so double J stents were placed.  The patient presents now with nausea, vomiting, chills, and lightheadedness.  He recently had just been started on Rapaflo and thinks that he was having side effects to the medicine.  He does have a low-grade temp, persistent pyuria, and microscopic hematuria and a creatinine of 3.7 on admission 2 days ago.  A repeat ultrasound now is showing minimal hydronephrosis,  improved significantly since previous evaluations.  The patient is feeling better today- he has been put on antibiotics empirically.  Of note, the urine culture from the last admission when he also had heavy pyuria was negative and that was sent prior to initiation of antibiotics.  The patient had a chest x-ray on the last admission which showed emphysema with bilateral upper lung zone  nodular disease. He also has had a PSA done which was negative.  PAST MEDICAL HISTORY:  Quit smoking a few years ago.  No high blood pressure, diabetes or other chronic medical conditions.  Did not take any medication prior to the May admission and no over-the-counter medicines.  PAST SURGICAL HISTORY:  None.  SOCIAL HISTORY:  He lives with his wife.  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  Denies any alcohol use.  Has 1 son in his 36s.  FAMILY HISTORY:  Noncontributory.  REVIEW OF SYSTEMS:  Denies any recent headache, visual change, sore throat, difficulty swallowing, hemoptysis, productive cough, chest pain, abdominal pain, nausea, vomiting or diarrhea.  Denies any difficulty voiding since his Foley catheter was recently removed.  When asked about prostate related symptoms prior to his initial presentation he does say that for quite some time he has noticed an incontinence when sleeping at night.  He would make sure to use the bathroom before going to bed, but would on a regular basis awake with having urinated.  Denies any joint pain or swelling, skin rash or itching, focal numbness or weakness.  PHYSICAL EXAMINATION:  VITAL SIGNS:  Temperature 100.3, blood pressure 140/60, pulse 100,  respirations 18. GENERAL:  This is a pleasant small framed adult male in no distress. SKIN:  Warm and dry without rash, cyanosis or edema. HEENT:  PERRLA, EOMI.  Throat is clear and moist. NECK:  Supple without JVD.  He does have a right carotid bruit. CHEST:  Clear throughout.  Good air movement. CARDIAC:  Regular rate and rhythm without murmur, rub or gallop. ABDOMEN:  Soft, no masses, or organomegaly.  No ascites.  No bruits. Good bowel sounds. RECTAL:  Deferred. GU:  Normal male genitalia.  Foley catheter has been replaced this admission. EXTREMITIES:  No edema, no ischemic changes in the feet, no  femoral bruits. NEUROLOGIC:  No focal deficits.  LABORATORY DATA:  Sodium 138, potassium 4.3, CO2 20, BUN 38, current 3.98, hemoglobin 8, hematocrit 24%, platelets 99,000, white blood count 6000.  I looked at the spun urine which showed numerous white blood cells  and red blood cells and not a lot of bacteria.  There were no red blood cell casts,  no granular casts.  There was one cast which looked like it had white cells in it -- borderline for WBC cast.  IMPRESSION: 1. Acute-on-chronic renal failure in the setting of recently     discovered bilateral hydronephrosis, benign prostatic hypertrophy     with bladder distention.  Most obvious cause for his chronic kidney     failure is long-standing untreated obstructive nephropathy from     unrecognized BPH.  Acute component most likely due to dehydration and UTI 2. Chronic obstructive pulmonary disease. 3. Nodular lung changes on chest x-ray from Jun 09, 2010. 4. Pyuria, urinary tract infection vs. sterile pyuria.  Urine culture from last     admission was negative. 5. Bilateral hydronephrosis status post double J stent placement,     normal PSA. 6. Anemia probably due to chronic kidney disease.  RECOMMENDATIONS:  Most likely this is chronic kidney damage due to chronic obstruction from BPH.  Not sure why the patient has persistent pyuria-- this may just be recurrent simple UTI.  However, if there is  sterile pyuria, would consider an atypical infection such as tuberculosis,  fungal UTI.  Also, sarcoidosis could cause nodular lung disease and renal failure.  Treat with IV fluids for now.  Check iron and TIBC, PTH, hepatitis studies, urine for protein to creatinine ratio, ACE level.  Check SPEP and UPEP.     Sol Blazing, M.D.     RDS/MEDQ  D:  07/10/2010  T:  07/11/2010  Job:  [id number]  Electronically Signed by Roney Jaffe M.D. on 07/11/2010 09:04:02 AM

## 2010-07-12 ENCOUNTER — Other Ambulatory Visit: Payer: Self-pay | Admitting: Internal Medicine

## 2010-07-12 LAB — CBC
HCT: 25.4 % — ABNORMAL LOW (ref 39.0–52.0)
Hemoglobin: 8.1 g/dL — ABNORMAL LOW (ref 13.0–17.0)
MCH: 27.9 pg (ref 26.0–34.0)
MCHC: 31.9 g/dL (ref 30.0–36.0)
MCV: 87.6 fL (ref 78.0–100.0)
Platelets: 133 10*3/uL — ABNORMAL LOW (ref 150–400)
RBC: 2.9 MIL/uL — ABNORMAL LOW (ref 4.22–5.81)
RDW: 15.3 % (ref 11.5–15.5)
WBC: 5.1 10*3/uL (ref 4.0–10.5)

## 2010-07-12 LAB — BASIC METABOLIC PANEL
BUN: 30 mg/dL — ABNORMAL HIGH (ref 6–23)
CO2: 18 mEq/L — ABNORMAL LOW (ref 19–32)
Calcium: 8.1 mg/dL — ABNORMAL LOW (ref 8.4–10.5)
Chloride: 113 mEq/L — ABNORMAL HIGH (ref 96–112)
Creatinine, Ser: 3.54 mg/dL — ABNORMAL HIGH (ref 0.50–1.35)
GFR calc Af Amer: 21 mL/min — ABNORMAL LOW (ref >60)
GFR calc non Af Amer: 17 mL/min — ABNORMAL LOW (ref >60)
Glucose, Bld: 100 mg/dL — ABNORMAL HIGH (ref 70–99)
Potassium: 4 mEq/L (ref 3.5–5.1)
Sodium: 139 mEq/L (ref 135–145)

## 2010-07-12 LAB — PROTEIN ELECTROPH W RFLX QUANT IMMUNOGLOBULINS
Albumin ELP: 43.8 % — ABNORMAL LOW (ref 55.8–66.1)
Alpha-1-Globulin: 8.7 % — ABNORMAL HIGH (ref 2.9–4.9)
Alpha-2-Globulin: 17.5 % — ABNORMAL HIGH (ref 7.1–11.8)
Beta 2: 7.7 % — ABNORMAL HIGH (ref 3.2–6.5)
Beta Globulin: 4.8 % (ref 4.7–7.2)
Gamma Globulin: 17.5 % (ref 11.1–18.8)
M-Spike, %: NOT DETECTED g/dL
Total Protein ELP: 5.4 g/dL — ABNORMAL LOW (ref 6.0–8.3)

## 2010-07-12 LAB — IMMUNOFIXATION ADD-ON

## 2010-07-12 LAB — SODIUM, URINE, RANDOM: Sodium, Ur: 102 mEq/L

## 2010-07-12 LAB — PTH, INTACT AND CALCIUM
Calcium, Total (PTH): 7.5 mg/dL — ABNORMAL LOW (ref 8.4–10.5)
PTH: 94.9 pg/mL — ABNORMAL HIGH (ref 14.0–72.0)

## 2010-07-12 LAB — CREATININE, URINE, RANDOM: Creatinine, Urine: 48.65 mg/dL

## 2010-07-12 LAB — POCT OCCULT BLOOD STOOL (DEVICE): Fecal Occult Bld: POSITIVE

## 2010-07-13 LAB — BASIC METABOLIC PANEL
BUN: 27 mg/dL — ABNORMAL HIGH (ref 6–23)
CO2: 18 mEq/L — ABNORMAL LOW (ref 19–32)
Calcium: 8.1 mg/dL — ABNORMAL LOW (ref 8.4–10.5)
Chloride: 116 mEq/L — ABNORMAL HIGH (ref 96–112)
Creatinine, Ser: 3.2 mg/dL — ABNORMAL HIGH (ref 0.50–1.35)
GFR calc Af Amer: 23 mL/min — ABNORMAL LOW (ref >60)
GFR calc non Af Amer: 19 mL/min — ABNORMAL LOW (ref >60)
Glucose, Bld: 88 mg/dL (ref 70–99)
Potassium: 4.2 mEq/L (ref 3.5–5.1)
Sodium: 141 mEq/L (ref 135–145)

## 2010-07-13 LAB — UIFE/LIGHT CHAINS/TP QN, 24-HR UR
Albumin, U: DETECTED
Alpha 1, Urine: DETECTED — AB
Alpha 2, Urine: DETECTED — AB
Beta, Urine: DETECTED — AB
Free Kappa Lt Chains,Ur: 33.6 mg/dL — ABNORMAL HIGH (ref 0.14–2.42)
Free Kappa/Lambda Ratio: 6 ratio (ref 2.04–10.37)
Free Lambda Lt Chains,Ur: 5.6 mg/dL — ABNORMAL HIGH (ref 0.02–0.67)
Gamma Globulin, Urine: DETECTED — AB
Total Protein, Urine: 61 mg/dL

## 2010-07-13 LAB — CBC
HCT: 23.3 % — ABNORMAL LOW (ref 39.0–52.0)
Hemoglobin: 7.4 g/dL — ABNORMAL LOW (ref 13.0–17.0)
MCH: 28 pg (ref 26.0–34.0)
MCHC: 31.8 g/dL (ref 30.0–36.0)
MCV: 88.3 fL (ref 78.0–100.0)
Platelets: 134 10*3/uL — ABNORMAL LOW (ref 150–400)
RBC: 2.64 MIL/uL — ABNORMAL LOW (ref 4.22–5.81)
RDW: 15.4 % (ref 11.5–15.5)
WBC: 5.3 10*3/uL (ref 4.0–10.5)

## 2010-07-13 LAB — URINE CULTURE
Colony Count: 55000
Culture  Setup Time: 201206232117

## 2010-07-13 LAB — IGG, IGA, IGM
IgA: 199 mg/dL (ref 68–379)
IgG (Immunoglobin G), Serum: 963 mg/dL (ref 650–1600)
IgM, Serum: 47 mg/dL — ABNORMAL LOW (ref 41–251)

## 2010-07-18 LAB — UIFE/LIGHT CHAINS/TP QN, 24-HR UR
Albumin, U: DETECTED
Alpha 1, Urine: DETECTED — AB
Alpha 2, Urine: DETECTED — AB
Beta, Urine: DETECTED — AB
Free Kappa Lt Chains,Ur: 18.3 mg/dL — ABNORMAL HIGH (ref 0.14–2.42)
Free Kappa/Lambda Ratio: 5.68 ratio (ref 2.04–10.37)
Free Lambda Excretion/Day: 75.67 mg/d
Free Lambda Lt Chains,Ur: 3.22 mg/dL — ABNORMAL HIGH (ref 0.02–0.67)
Free Lt Chn Excr Rate: 430.05 mg/d
Gamma Globulin, Urine: DETECTED — AB
Time: 24 hours
Total Protein, Urine-Ur/day: 999 mg/d — ABNORMAL HIGH (ref 10–140)
Total Protein, Urine: 42.5 mg/dL
Volume, Urine: 2350 mL

## 2010-07-22 ENCOUNTER — Encounter (HOSPITAL_COMMUNITY): Payer: Medicare Other

## 2010-07-22 ENCOUNTER — Other Ambulatory Visit: Payer: Self-pay | Admitting: Urology

## 2010-07-22 LAB — BASIC METABOLIC PANEL
BUN: 37 mg/dL — ABNORMAL HIGH (ref 6–23)
CO2: 26 mEq/L (ref 19–32)
Calcium: 9 mg/dL (ref 8.4–10.5)
Chloride: 104 mEq/L (ref 96–112)
Creatinine, Ser: 2.9 mg/dL — ABNORMAL HIGH (ref 0.50–1.35)
GFR calc Af Amer: 26 mL/min — ABNORMAL LOW (ref >60)
GFR calc non Af Amer: 22 mL/min — ABNORMAL LOW (ref >60)
Glucose, Bld: 88 mg/dL (ref 70–99)
Potassium: 5 mEq/L (ref 3.5–5.1)
Sodium: 137 mEq/L (ref 135–145)

## 2010-07-22 LAB — SURGICAL PCR SCREEN
MRSA, PCR: NEGATIVE
Staphylococcus aureus: NEGATIVE

## 2010-07-22 LAB — CBC
HCT: 30.3 % — ABNORMAL LOW (ref 39.0–52.0)
Hemoglobin: 9.4 g/dL — ABNORMAL LOW (ref 13.0–17.0)
MCH: 28.1 pg (ref 26.0–34.0)
MCHC: 31 g/dL (ref 30.0–36.0)
MCV: 90.4 fL (ref 78.0–100.0)
Platelets: 308 10*3/uL (ref 150–400)
RBC: 3.35 MIL/uL — ABNORMAL LOW (ref 4.22–5.81)
RDW: 15 % (ref 11.5–15.5)
WBC: 6.1 10*3/uL (ref 4.0–10.5)

## 2010-07-24 NOTE — H&P (Signed)
Keith [name], FOXX NO.:  [id number]  MEDICAL RECORD NO.:  [id number]  LOCATION:  [id number]                         FACILITY:  [facility]  PHYSICIAN:  Bonnielee Haff, MD     DATE OF BIRTH:  June 30, 1940  DATE OF ADMISSION:  07/08/2010 DATE OF DISCHARGE:                             HISTORY & PHYSICAL   PRIMARY CARE PHYSICIAN:  Really unassigned at this time.  He did go to [facility]; however, he disliked the physician there and he does not want to go back.  UROLOGIST:  Raynelle Bring, MD  ADMISSION DIAGNOSES: 1. Acute renal failure, with chronic uropathy. 2. Urinary tract infection. 3. Atrial arrhythmia of unclear etiology. 4. Anemia likely secondary to chronic disease. 5. Dehydration.  CHIEF COMPLAINT:  Weakness and chills.  HISTORY OF PRESENT ILLNESS:  The patient is a 70 year old Caucasian male who does not have any medical problems except for recent hypothyroidism that was diagnosed while he was hospitalized.  The patient also was found during previous hospitalization in May to have obstructive uropathy.  He was having hematuria.  He was seen by Urology, underwent stent placement to both his ureters on June 21, 2010.  His Foley catheter was discontinued about a week ago at Dr. Lynne Logan office and then yesterday the patient started having some dizziness and nausea.  He went to the bed, felt better.  This morning, he woke up.  He had vomited x1. He had chills.  Denies any fever.  Yesterday when he was dizzy, he had to lean against the wall, but denies any syncopal episode.  Denies any chest pain, shortness of breath or palpitations.  Denies any sick contacts recently.  He feels generalized weakness.  He tells me that yesterday when he was passing urine, it was dark.  He has not had any difficulty with passing urine, however, today when he passed urine, it was bloody.  Has been having some frequency since yesterday with urination, but denies any  dysuria.  He was started on Rapaflo about 2 to 3 days ago.  MEDICATIONS AT HOME: 1. He is on doxazosin 1 mg at bedtime. 2. Rapaflo 8 mg at bedtime. 3. Levothyroxine 12.5 mcg daily.  ALLERGIES:  PENICILLIN which causes unknown reaction.  PAST MEDICAL HISTORY:  Positive for obstructive uropathy, recent bilateral ureteral stent placement, recent hematuria, recently diagnosed hypothyroidism for a TSH of 5.55, though no free T4 is available.  SURGICAL HISTORY:  Includes some kind of testicular infection in the 1970s for which he underwent surgery.  He has had multiple UTIs in the past.  SOCIAL HISTORY:  He lives in [place] with his wife.  He works in Engineer, maintenance in [employer]  Denies smoking alcohol or illicit drug use.  Otherwise, independent with his daily activities.  FAMILY HISTORY:  Positive for father who died of lung cancer.  He was a heavy smoker.  Mother died of Alzheimer's dementia.  REVIEW OF SYSTEMS:  GENERAL SYSTEM:  Positive for weakness, malaise. HEENT: Unremarkable.  CARDIOVASCULAR:  Unremarkable.  RESPIRATORY: Unremarkable.  GI:  See HPI.  GU:  As in HPI.  NEUROLOGICAL: Unremarkable.  PSYCHIATRIC:  Unremarkable.  RHEUMATOLOGIC: Unremarkable.  Other systems reviewed and found to be negative.  PHYSICAL EXAMINATION:  VITAL SIGNS:  Temperature is 99.2, blood pressure 154/49, heart rate in the 110s to 120s, slightly irregular, respiratory rate 16, saturation 97% on room air, blood pressure went from 126 sitting to 114 standing and it was 154 lying down, so there is evidence for orthostatic hypotension. GENERAL:  He is a thin white male, in no distress.  He feels very warm to touch. HEENT:  Head is normocephalic, atraumatic.  Pupils are equal reacting. No pallor, no icterus.  Oral mucous membranes dry.  No oral lesions are noted. NECK:  Soft, supple.  No thyromegaly appreciated. LUNGS:  Clear to auscultation bilaterally with no wheezing, rales  or rhonchi. CARDIOVASCULAR:  S1, S2 shows irregular rhythm.  No S3 or S4.  No rubs, murmurs or bruits. ABDOMEN:  Soft, nontender, nondistended.  Bowel sounds are present.  No masses or organomegaly is appreciated. GU:  Does not show any obvious deformity. MUSCULOSKELETAL:  Normal muscle mass and tone. NEUROLOGIC:  He is alert and oriented x3.  No cranial nerve deficits. No motor strength deficits in upper or lower extremity is noted.  He could not stood up because of his recent dizziness. SKIN:  Does not show any rashes.  EKG was done today which appears to have predominately sinus tachycardia with PACs.  This could have PVCs as well as it is difficult to appreciate.  He seems to have left axis deviation.  No concerning ST changes are noted on this EKG, the rate is at 120.  This EKG was compared to EKG from earlier in June and he did not have tachycardia. At that time, he did have right bundle branch block which is present during this EKG as well.  IMAGING STUDIES:  He had acute abdominal series with chest, which did not show any obstruction.  No cardiopulmonary disease was noted. Bilateral double-J stent was noted to be in place.  Bladder was noted to be distended.  LAB DATA:  His white cell count is 13,000 with 83% neutrophils.  No bands reported.  Hemoglobin is 9.7, platelet count is 125.  Sodium is 132, glucose is 140, BUN is 43, creatinine is 3.72.  LFTs are normal. UA shows brown turbid urine, large blood, greater than 300 protein, large leukocyte esterase, innumerable WBCs, innumerable RBCs, few bacteria.  Of note, his urine culture from back in May did not show any growth.  Renal ultrasound from Jun 12, 2010 continued to show hydronephrosis although it had improved compared to previous ultrasound.  ASSESSMENT:  This is a 70 year old, Caucasian male who presents with weakness, chills, is found to have acute-on-chronic renal failure, has hematuria.  He had distended  bladder seen on x-ray.  He likely has another urinary tract infection for which he will be given antibiotics. He is also dehydrated and has orthostatic hypotension. 1. Urinary tract infection will be treated with ceftriaxone.  We will     get urine cultures. 2. Acute-on-chronic renal failure will be evaluated using a renal     ultrasound.  Because of bladder is distended on the imaging studies     here, we will proceed Foley placement.  I have discussed with Dr.     Janice Norrie who is covering for Dr. Alinda Money.  He will evaluate the patient     as well.  IV fluids will also be given. 3. Orthostatic hypotension due to dehydration.  We will give him IV     fluids. 4.  Near syncope probably because of orthostatic hypotension.  We will     get a CT of the head as well. 5. Possible atrial arrhythmia.  We will get an echocardiogram, repeat     EKG in the morning and monitor him on telemetry with IV fluids.     His heart rate should improve. 6. History of hypothyroidism.  We will check a free T4 and a TSH     level. 7. Anemia appears to be stable.  We will require outpatient     evaluation. 8. He has mild thrombocytopenia as well which will be monitored     closely.  He was on doxazosin and Rapaflo, this could have contributed to his dizziness.  So, we will discontinue doxazosin.  We will continue the Rapaflo for now as it may help his urinary complaints and symptoms.  If he continues to have significant dizziness, consideration to discontinuing it will be given.  Further management and decisions will depend on results of further testing and patient's response to treatment.   Bonnielee Haff, MD     GK/MEDQ  D:  07/08/2010  T:  07/08/2010  Job:  [id number]  cc:   Raynelle Bring, MD Hanley Ben, M.D.  Electronically Signed by Bonnielee Haff MD on 07/24/2010 08:11:44 PM

## 2010-07-28 ENCOUNTER — Ambulatory Visit (HOSPITAL_COMMUNITY)
Admission: RE | Admit: 2010-07-28 | Discharge: 2010-07-30 | Disposition: A | Discharge status: Home / Self Care | Payer: Medicare Other | Source: Ambulatory Visit | Attending: Urology | Admitting: Urology

## 2010-07-28 DIAGNOSIS — Z01810 Encounter for preprocedural cardiovascular examination: Secondary | ICD-10-CM | POA: Insufficient documentation

## 2010-07-28 DIAGNOSIS — R339 Retention of urine, unspecified: Secondary | ICD-10-CM | POA: Insufficient documentation

## 2010-07-28 DIAGNOSIS — N1330 Unspecified hydronephrosis: Secondary | ICD-10-CM | POA: Insufficient documentation

## 2010-07-28 DIAGNOSIS — N401 Enlarged prostate with lower urinary tract symptoms: Secondary | ICD-10-CM | POA: Insufficient documentation

## 2010-07-28 DIAGNOSIS — N189 Chronic kidney disease, unspecified: Secondary | ICD-10-CM | POA: Insufficient documentation

## 2010-07-28 DIAGNOSIS — N138 Other obstructive and reflux uropathy: Secondary | ICD-10-CM | POA: Insufficient documentation

## 2010-07-28 DIAGNOSIS — Z01812 Encounter for preprocedural laboratory examination: Secondary | ICD-10-CM | POA: Insufficient documentation

## 2010-07-28 DIAGNOSIS — K219 Gastro-esophageal reflux disease without esophagitis: Secondary | ICD-10-CM | POA: Insufficient documentation

## 2010-07-28 LAB — BASIC METABOLIC PANEL
BUN: 34 mg/dL — ABNORMAL HIGH (ref 6–23)
CO2: 21 mEq/L (ref 19–32)
Calcium: 8.8 mg/dL (ref 8.4–10.5)
Chloride: 108 mEq/L (ref 96–112)
Creatinine, Ser: 2.97 mg/dL — ABNORMAL HIGH (ref 0.50–1.35)
GFR calc Af Amer: 26 mL/min — ABNORMAL LOW (ref >60)
GFR calc non Af Amer: 21 mL/min — ABNORMAL LOW (ref >60)
Glucose, Bld: 119 mg/dL — ABNORMAL HIGH (ref 70–99)
Potassium: 4.9 mEq/L (ref 3.5–5.1)
Sodium: 135 mEq/L (ref 135–145)

## 2010-07-29 LAB — BASIC METABOLIC PANEL
BUN: 33 mg/dL — ABNORMAL HIGH (ref 6–23)
BUN: 34 mg/dL — ABNORMAL HIGH (ref 6–23)
CO2: 21 mEq/L (ref 19–32)
CO2: 21 mEq/L (ref 19–32)
Calcium: 9 mg/dL (ref 8.4–10.5)
Calcium: 8.7 mg/dL (ref 8.4–10.5)
Chloride: 105 mEq/L (ref 96–112)
Chloride: 105 mEq/L (ref 96–112)
Creatinine, Ser: 3.42 mg/dL — ABNORMAL HIGH (ref 0.50–1.35)
Creatinine, Ser: 3.27 mg/dL — ABNORMAL HIGH (ref 0.50–1.35)
GFR calc Af Amer: 22 mL/min — ABNORMAL LOW (ref >60)
GFR calc Af Amer: 23 mL/min — ABNORMAL LOW (ref >60)
GFR calc non Af Amer: 18 mL/min — ABNORMAL LOW (ref >60)
GFR calc non Af Amer: 19 mL/min — ABNORMAL LOW (ref >60)
Glucose, Bld: 126 mg/dL — ABNORMAL HIGH (ref 70–99)
Glucose, Bld: 129 mg/dL — ABNORMAL HIGH (ref 70–99)
Potassium: 5.2 mEq/L — ABNORMAL HIGH (ref 3.5–5.1)
Potassium: 4.3 mEq/L (ref 3.5–5.1)
Sodium: 133 mEq/L — ABNORMAL LOW (ref 135–145)
Sodium: 133 mEq/L — ABNORMAL LOW (ref 135–145)

## 2010-08-03 NOTE — Op Note (Signed)
  [name], [name] NO.:  [id number]  MEDICAL RECORD NO.:  [id number]  LOCATION:  DAYL                         FACILITY:  [facility]  PHYSICIAN:  Keith Bring, MD      DATE OF BIRTH:  1940/09/16  DATE OF PROCEDURE:  07/28/2010 DATE OF DISCHARGE:                              OPERATIVE REPORT   PREOPERATIVE DIAGNOSES: 1. Chronic kidney disease. 2. Urinary retention.  POSTOPERATIVE DIAGNOSES: 1. Chronic kidney disease. 2. Urinary retention.  PROCEDURES: 1. Cystoscopy. 2. Transurethral vaporization of the prostate.  SURGEON:  Keith [name], M.D.  ANESTHESIA:  General.  COMPLICATIONS:  None.  ESTIMATED BLOOD LOSS:  Minimal.  INDICATIONS:  Keith [name] is a 70 year old gentleman who had presented to the hospital originally in acute renal failure with bilateral hydronephrosis and urinary retention.  Following Foley catheter placement, his creatinine did improve, although he continued to have persistent bilateral hydronephrosis.  He therefore underwent ureteral stent placement and his creatinine improved to 2.58 at its nadir.  He was given multiple voiding trials, which he subsequently failed and presents today for transurethral vaporization of the prostate to treat his urinary retention.  The potential risks, complications, and alternative treatment options have been discussed in detail and informed consent obtained.  DESCRIPTION OF PROCEDURE:  The patient was taken to the operating room and a general anesthetic was administered.  He was given preoperative antibiotics, placed in the dorsal lithotomy position, and prepped and draped in the usual sterile fashion.  Next, a preoperative time-out was performed.  Cystourethroscopy was then performed, which revealed enlarged lateral lobes as well as an intravesical lobe of the prostate. His bilateral ureteral indwelling stents were identified in their expected locations.  No bladder tumors, stones, or other  mucosal pathology was identified.  A 28 French resectoscope sheath was then placed into the bladder and utilizing the gyrus button, the prostate adenoma was vaporized.  The median lobe was first vaporized down to the level of the bladder mucosa.  Care was taken to avoid the ureteral orifices, which were easily identifiable with his indwelling stents. Once the posterior adenoma was resected from the bladder neck, was vaporized from the bladder neck back to the level of the verumontanum. The lateral prostatic lobes were subsequently vaporized.  The bladder was emptied and reinspected, and any persistent adenoma was subsequently vaporized.  Bleeding was controlled with electrocautery and hemostasis remained excellent throughout the procedure.  The resectoscope was then removed and a 24-French 3-way hematuria catheter was placed and placed to like continuous bladder irrigation.  The patient tolerated the procedure well without complications.  He was able to be awakened and transferred to recovery unit in satisfactory condition.     Keith Bring, MD     LB/MEDQ  D:  07/28/2010  T:  07/28/2010  Job:  [id number]  Electronically Signed by Keith Bring MD on 08/03/2010 10:49:01 PM

## 2010-08-03 NOTE — Discharge Summary (Signed)
  NAMESHRIYANSH, PODBIELSKI NO.:  [id number]  MEDICAL RECORD NO.:  [id number]  LOCATION:  [id number]                         FACILITY:  [facility]  PHYSICIAN:  Raynelle Bring, MD      DATE OF BIRTH:  1940/08/09  DATE OF ADMISSION:  07/28/2010 DATE OF DISCHARGE:  07/30/2010                              DISCHARGE SUMMARY   ADMISSION DIAGNOSES: 1. Urinary retention. 2. Chronic kidney disease.  DISCHARGE DIAGNOSES: 1. Urinary retention. 2. Chronic kidney disease.  HISTORY AND PHYSICAL:  For full details please see admission history and physical.  Briefly, Mr. Lunde is a 70 year old gentleman who was recently found to be in acute renal failure with bladder outlet obstruction and bilateral hydronephrosis.  He was initially managed with an indwelling Foley catheter and had a persistently elevated creatinine, although improvement in his renal function.  He also was noted to have persistent hydronephrosis and underwent bilateral ureteral stent placement.  His creatinine nadired at approximately 3.0.  He was given multiple voiding trials while on alpha blocker therapy, but failed these trials.  He, therefore, had a consultation regarding management options for treatment and elected to proceed with surgical therapy for his bladder outlet obstruction, presumably related to his benign prostatic hyperplasia.  HOSPITAL COURSE:  On July 28, 2010, he was taken to the operating room and underwent a transurethral vaporization of the prostate.  He tolerated this procedure well without complications.  He was initially maintained on continuous bladder irrigation and his Foley catheter was able to be discontinued on the morning of postoperative day #1. Notably, his creatinine had slightly increased to 3.42.  However, this was checked later on postoperative day #1 and had decreased to 3.27 which was near his baseline.  He initially was unable to void.  He voided later in the day on  postoperative day #1 with approximately 200 cc, but had 500 cc as a postvoid residual which was confirmed on in-and- out catheterization.  He was maintained in the hospital that evening and continued to void.  He had postvoid residual urines of approximately 250- 300 cc, but was voiding and his urine was clearing.  He was, therefore, felt stable for discharge home without a catheter on postoperative day #2 with plans to follow up with a postvoid residual as an outpatient.  DISPOSITION:  Home.  DISCHARGE MEDICATIONS:  He will resume his regular home medications including his doxazosin.  He has been instructed to resume his diet as before surgery and he will refrain from any heavy lifting, strenuous activity, or driving.  Prescriptions, he was given a prescription to take Vicodin as needed for pain and Colace as a stool softener.  FOLLOWUP:  He will follow up as scheduled in approximately 1 week with a postvoid residual urine.     Raynelle Bring, MD     LB/MEDQ  D:  07/31/2010  T:  08/01/2010  Job:  [id number]  Electronically Signed by Raynelle Bring MD on 08/03/2010 10:49:07 PM

## 2010-08-04 NOTE — Discharge Summary (Signed)
Keith [name], Keith [name] NO.:  [id number]  MEDICAL RECORD NO.:  [id number]  LOCATION:  [id number]                         FACILITY:  [facility]  PHYSICIAN:  Debbe Odea, M.D.     DATE OF BIRTH:  [birth year]  DATE OF ADMISSION:  07/08/2010 DATE OF DISCHARGE:  07/13/2010                              DISCHARGE SUMMARY   PRIMARY CARE PHYSICIAN:  None.  The patient is still on search of one.  UROLOGIST:  Raynelle Bring, MD  REASON FOR ADMISSION:  Weakness and chills.  DISCHARGE DIAGNOSES: 1. Acute renal failure, on chronic kidney disease stage IV. 2. Suspected urinary tract infection on admission, but per culture     most likely he did not have UTI. 3. Anemia with occult positive stool, needs outpatient GI workup. 4. Obstructive uropathy with recent placement of double-J stent. 5. Hypothyroidism which has been stable.  DISCHARGE MEDICATIONS:  Stop the following medication. 1. Rapaflo 8 mg daily.  Continue the following medications. 1. Doxazosin 4 mg at bedtime. 2. Levothyroxine 25 mcg half tablet daily before breakfast.  CONSULTANTS DURING HOSPITAL STAY: 1. Urology consult initially with Dr. Hanley Ben, later evaluated     by Raynelle Bring, MD 2. Nephrology consult with Sol Blazing, M.D.  HOSPITAL COURSE:  This is a 70 year old male who recently had double-J stents placed for obstructive uropathy and bilateral hydronephrosis.  He is known to have BPH. 1. Acute renal failure on chronic kidney disease.  Despite placement     of stents and adequate drainage of urine, the patient continues to     have elevated creatinine.  His creatinine when last checked today     is 3.20.  He was admitted with a creatinine of 3.72.  Based on this     creatinine, his GFR is a ranging between 15 and 17 placing him in     chronic kidney disease stage IV.  A nephrology consult was called     and he was evaluated by Dr. Jonnie Finner who did order further workup to     evaluate for the  cause of his chronic renal failure.  He did have a     serum protein electrophoresis.  No M-spike was detected.  Hepatitis     profile thus far has revealed negative workup for Hep B.  A urine     protein electrophoresis finds elevated amount of lambda light     chains at 5.60 and free kappa light chains at 33.60.  The patient     does not have an M-spike.  He will be following up with Nephrology/Dr. Marval Regal as an outpatient.  The patient was found to be anemic during his hospital stay.  Iron studies revealed that his iron levels are low, iron is 16, percent saturation is 11, however, ferritin was high at 3050.  The patient also had two sets of stool occults done, one of which was negative and second was positive.  He has been advised strongly that he needs to follow up with a GI physician and does not usually have any indigestion/heartburn or any trouble eating.  He has not been losing any weight.  He has never had  a screening colonoscopy and therefore is due for this as well.  On discharge, his hemoglobin is noted to be 7.4, it has been ranging between 7 and 8 during the hospital stay, it was 9.7 on admission and possibly due to hemodilution has dropped.  We have not given him blood transfusion.  PHYSICAL EXAMINATION:  GENERAL:  The patient is awake, alert, and oriented. HEART:  Regular rate and rhythm.  No murmurs. LUNGS:  Clear bilaterally.  Normal respiratory effort.  No use of accessory muscles. ABDOMEN:  Soft, nontender, nondistended.  Bowel sounds positive. EXTREMITIES:  No cyanosis, clubbing or edema.  The patient states he feels 100% better than what he feels and when he came in, he states he feels like he could run a marathon.  FOLLOWUP INSTRUCTIONS:  The patient understands that he needs to follow up with the following 1. Dr. Alinda Money this coming Friday. 2. Dr. Marval Regal in 1-2 weeks. 3. He will be choosing a primary care physician and establishing  care.  CONDITION ON DISCHARGE:  Stable.  Time on discharge today and patient care was 60 minutes.     Debbe Odea, M.D.     SR/MEDQ  D:  07/13/2010  T:  07/13/2010  Job:  [id number]  Electronically Signed by Debbe Odea M.D. on 08/04/2010 12:16:57 PM

## 2010-08-14 ENCOUNTER — Emergency Department (HOSPITAL_COMMUNITY)
Admission: EM | Admit: 2010-08-14 | Discharge: 2010-08-14 | Disposition: A | Discharge status: Home / Self Care | Payer: Medicare Other | Attending: Emergency Medicine | Admitting: Emergency Medicine

## 2010-08-14 DIAGNOSIS — E039 Hypothyroidism, unspecified: Secondary | ICD-10-CM | POA: Insufficient documentation

## 2010-08-14 DIAGNOSIS — R319 Hematuria, unspecified: Secondary | ICD-10-CM | POA: Insufficient documentation

## 2010-08-14 LAB — URINALYSIS, ROUTINE W REFLEX MICROSCOPIC
Bilirubin Urine: NEGATIVE
Glucose, UA: NEGATIVE mg/dL
Ketones, ur: 15 mg/dL — AB
Nitrite: NEGATIVE
Protein, ur: 100 mg/dL — AB
Specific Gravity, Urine: 1.012 (ref 1.005–1.030)
Urobilinogen, UA: 0.2 mg/dL (ref 0.0–1.0)
pH: 6 (ref 5.0–8.0)

## 2010-08-14 LAB — DIFFERENTIAL
Basophils Absolute: 0 10*3/uL (ref 0.0–0.1)
Basophils Relative: 0 % (ref 0–1)
Eosinophils Absolute: 0.2 10*3/uL (ref 0.0–0.7)
Eosinophils Relative: 4 % (ref 0–5)
Lymphocytes Relative: 20 % (ref 12–46)
Lymphs Abs: 1.1 10*3/uL (ref 0.7–4.0)
Monocytes Absolute: 0.5 10*3/uL (ref 0.1–1.0)
Monocytes Relative: 8 % (ref 3–12)
Neutro Abs: 3.8 10*3/uL (ref 1.7–7.7)
Neutrophils Relative %: 68 % (ref 43–77)

## 2010-08-14 LAB — POCT I-STAT, CHEM 8
BUN: 30 mg/dL — ABNORMAL HIGH (ref 6–23)
Calcium, Ion: 1.22 mmol/L (ref 1.12–1.32)
Chloride: 112 mEq/L (ref 96–112)
Creatinine, Ser: 3.2 mg/dL — ABNORMAL HIGH (ref 0.50–1.35)
Glucose, Bld: 95 mg/dL (ref 70–99)
HCT: 27 % — ABNORMAL LOW (ref 39.0–52.0)
Hemoglobin: 9.2 g/dL — ABNORMAL LOW (ref 13.0–17.0)
Potassium: 4.6 mEq/L (ref 3.5–5.1)
Sodium: 142 mEq/L (ref 135–145)
TCO2: 19 mmol/L (ref 0–100)

## 2010-08-14 LAB — CBC
HCT: 27.7 % — ABNORMAL LOW (ref 39.0–52.0)
Hemoglobin: 9 g/dL — ABNORMAL LOW (ref 13.0–17.0)
MCH: 29.1 pg (ref 26.0–34.0)
MCHC: 32.5 g/dL (ref 30.0–36.0)
MCV: 89.6 fL (ref 78.0–100.0)
Platelets: 137 10*3/uL — ABNORMAL LOW (ref 150–400)
RBC: 3.09 MIL/uL — ABNORMAL LOW (ref 4.22–5.81)
RDW: 15.3 % (ref 11.5–15.5)
WBC: 5.6 10*3/uL (ref 4.0–10.5)

## 2010-08-14 LAB — URINE MICROSCOPIC-ADD ON

## 2010-08-16 LAB — URINE CULTURE
Colony Count: NO GROWTH
Culture  Setup Time: 201207300022
Culture: NO GROWTH

## 2010-10-04 ENCOUNTER — Encounter (HOSPITAL_COMMUNITY)
Admission: RE | Admit: 2010-10-04 | Discharge: 2010-10-04 | Disposition: A | Discharge status: Home / Self Care | Payer: Self-pay | Source: Ambulatory Visit | Attending: Nephrology | Admitting: Nephrology

## 2010-10-04 DIAGNOSIS — D509 Iron deficiency anemia, unspecified: Secondary | ICD-10-CM | POA: Insufficient documentation

## 2010-10-07 ENCOUNTER — Encounter (HOSPITAL_COMMUNITY): Payer: Self-pay

## 2013-01-01 IMAGING — US US RENAL
1 series · 13 of 25 positions shown · non-contrast
Comparison: CT of the abdomen and pelvis performed earlier today
at [DATE] a.m.

CLINICAL DATA: Acute renal failure and hematuria; assess for
hydronephrosis.

RENAL/URINARY TRACT ULTRASOUND COMPLETE

[Series 1: us renal · 0.28mm/px · 13 of 49 slices shown]
[im 1/49]
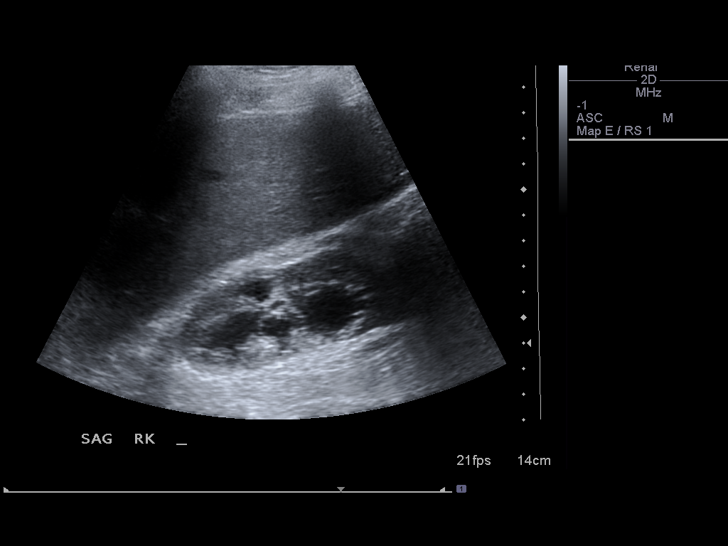
[im 5/49]
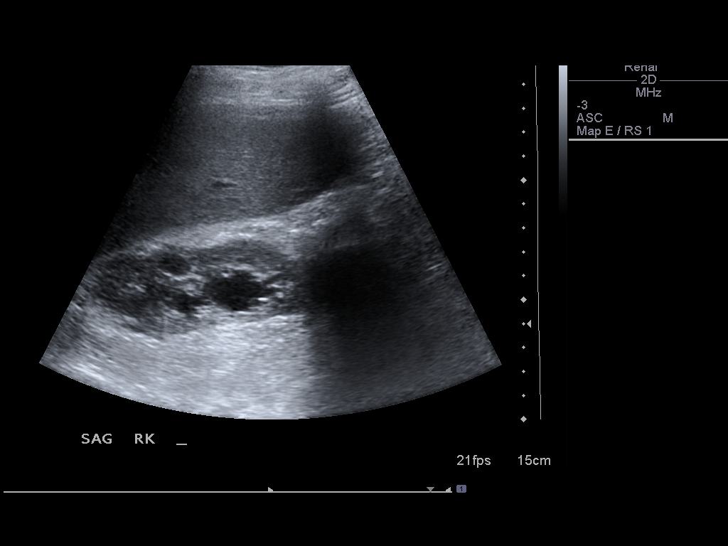
[im 9/49]
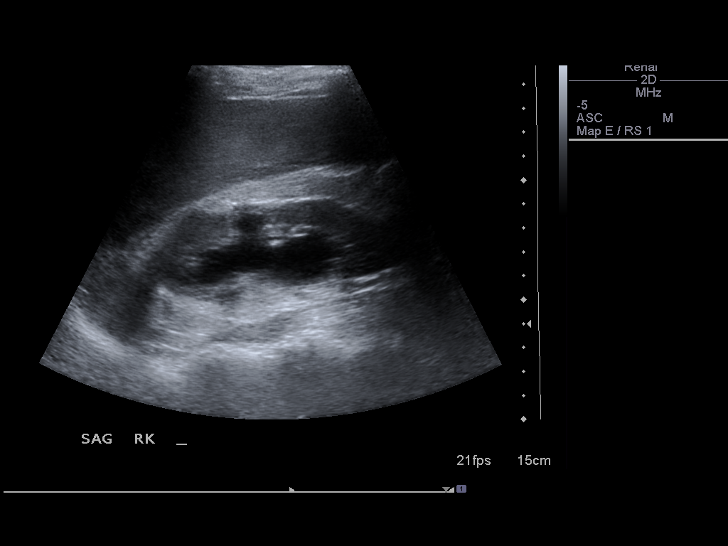
[im 13/49]
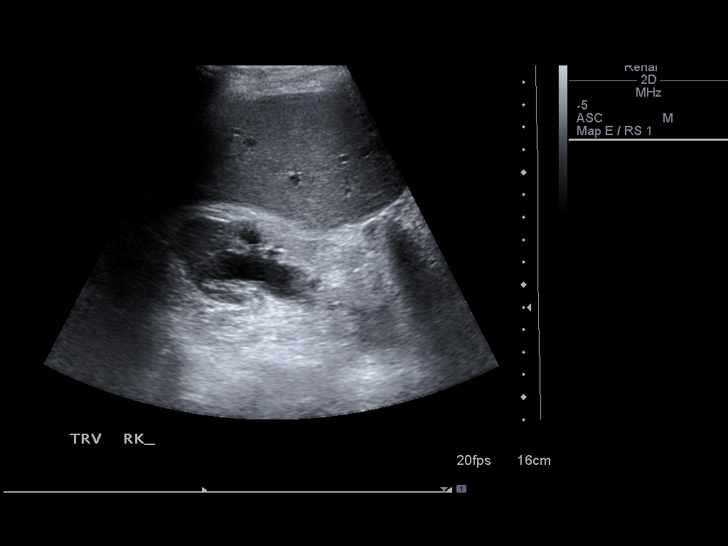
[im 17/49]
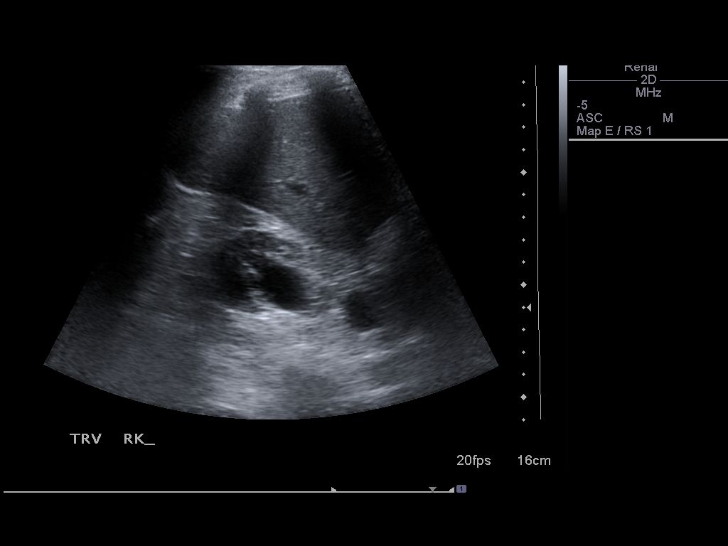
[im 21/49]
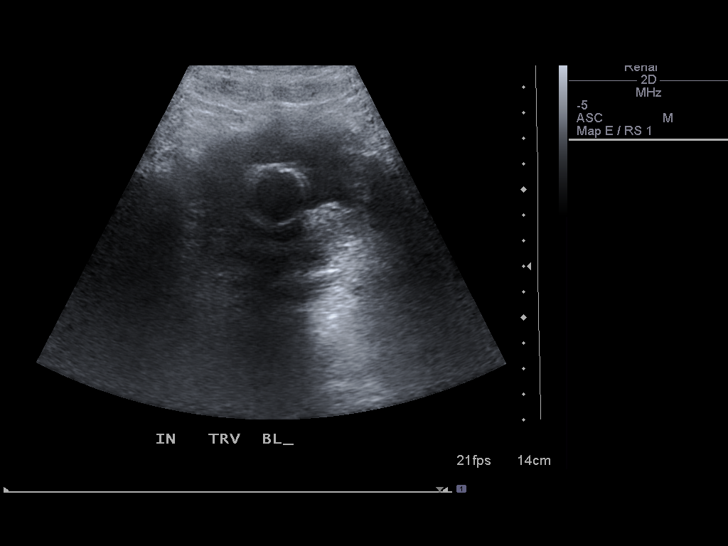
[im 25/49]
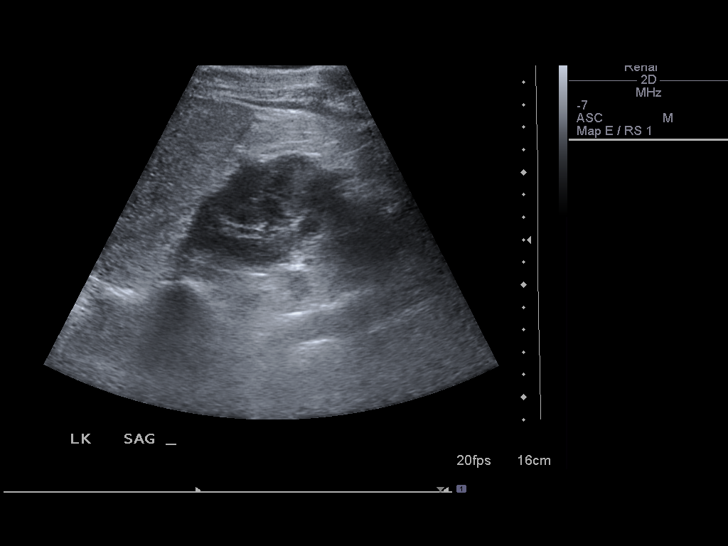
[im 29/49]
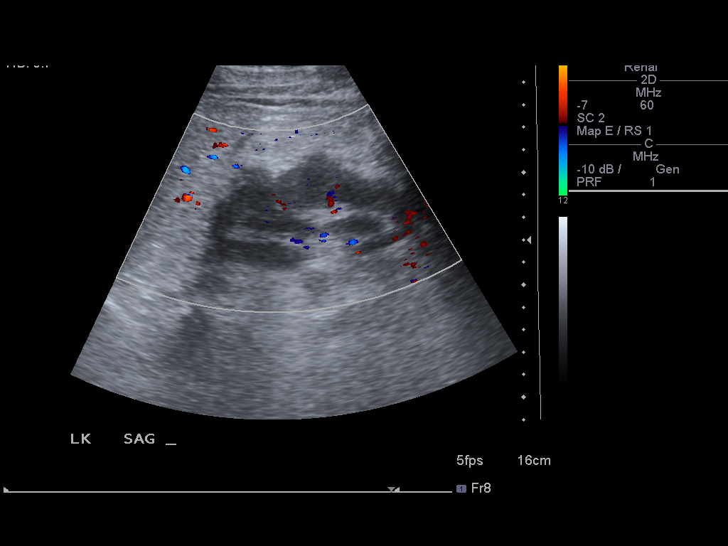
[im 33/49]
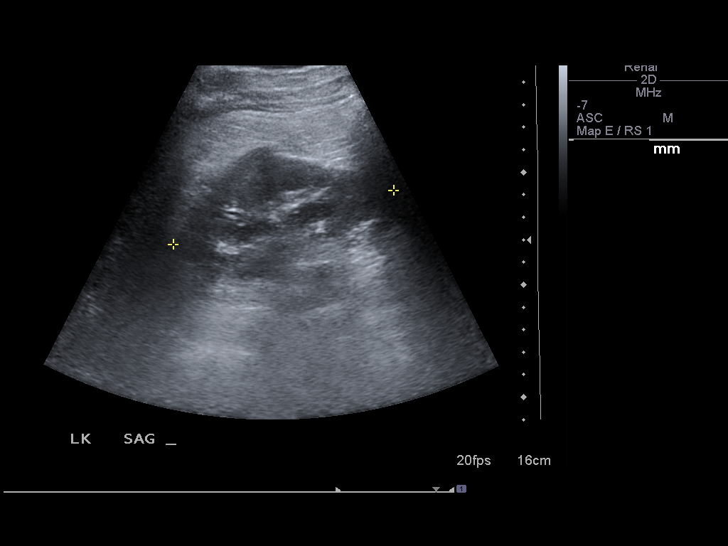
[im 37/49]
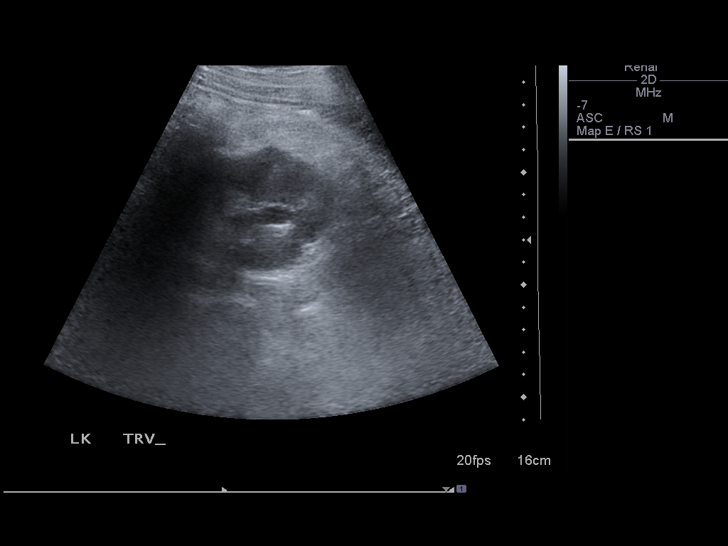
[im 41/49]
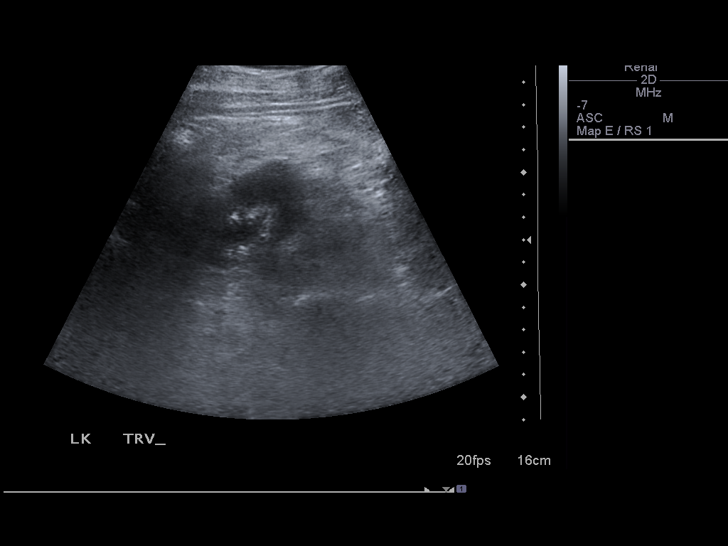
[im 45/49]
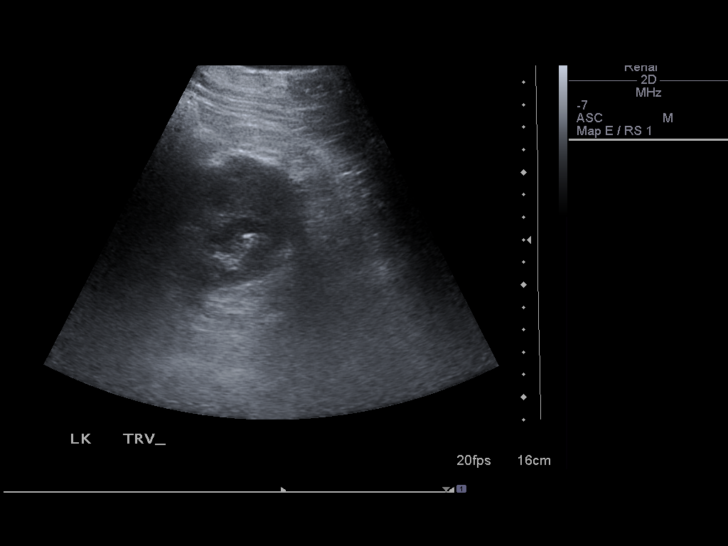
[im 49/49]
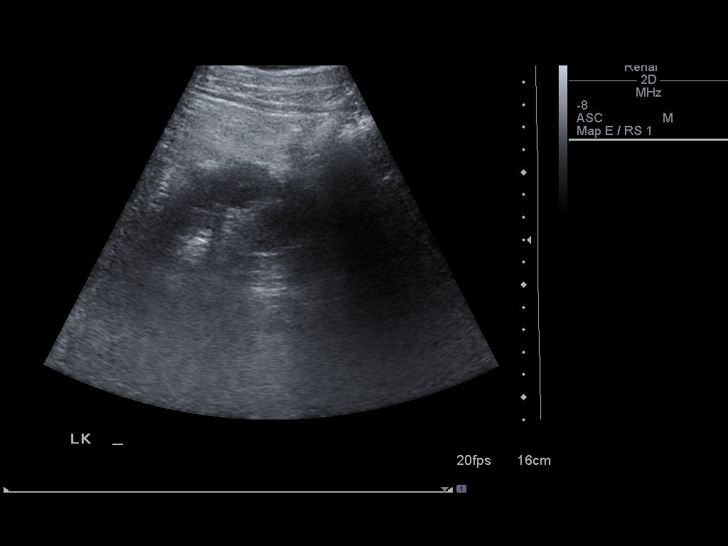

[13 of 25 positions shown; findings below may reference images not displayed]

FINDINGS: Right Kidney:  The right kidney measures 10.3 cm in length.  The
kidney demonstrates grossly normal size, echogenicity and
configuration.  No significant cortical thinning is seen.  Moderate
to severe right-sided hydronephrosis is noted.  No masses are seen.

Left Kidney:  The left kidney measures 10.1 cm in length.  The
kidney demonstrates grossly normal size, echogenicity and
configuration.  No significant cortical thinning is seen.  Mild
left-sided hydronephrosis is noted.  No masses are seen.

On comparison with recent CT, there was corresponding marked
dilatation of both ureters to the level of the vesicoureteral
junctions bilaterally.  There was also diffuse mild irregular
thickening of the entire bladder wall despite marked bladder
distension, likely reflecting a neurogenic bladder.

Bladder:  The bladder is decompressed, with a Foley catheter in
place.
IMPRESSION: 1.  Moderate to severe right-sided and mild left-sided
hydronephrosis, improved from recent prior CT status post Foley
catheter placement.
2.  Marked dilatation of both ureters on recent CT, to the level of
the vesicoureteral junctions bilaterally, with diffuse mild
irregular thickening of the entire bladder wall despite marked
bladder distension.  This likely reflects a neurogenic bladder.

## 2013-01-01 IMAGING — CR DG CHEST 2V
2 series · 2 of 2 positions shown · non-contrast
Comparison: None.

CLINICAL DATA: Renal failure

CHEST - 2 VIEW

[w chest pa]
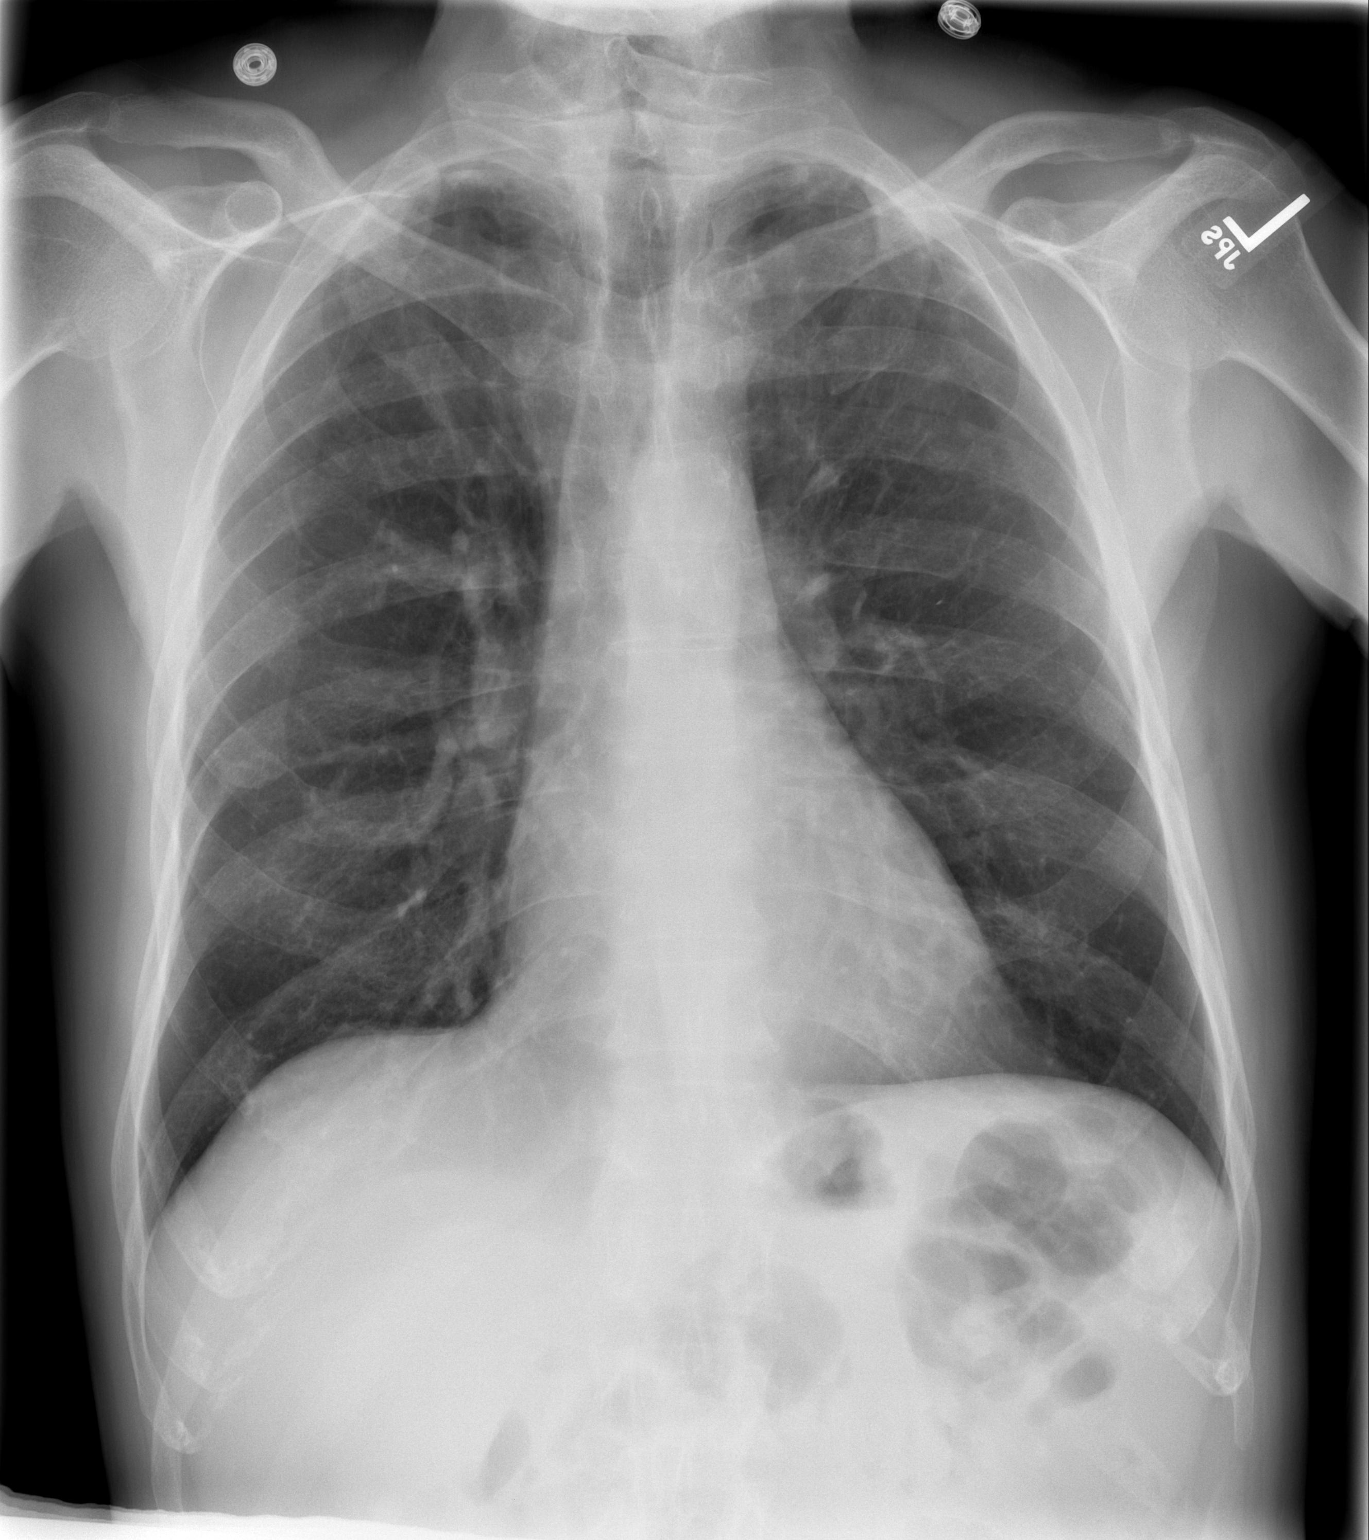

[w chest lat]
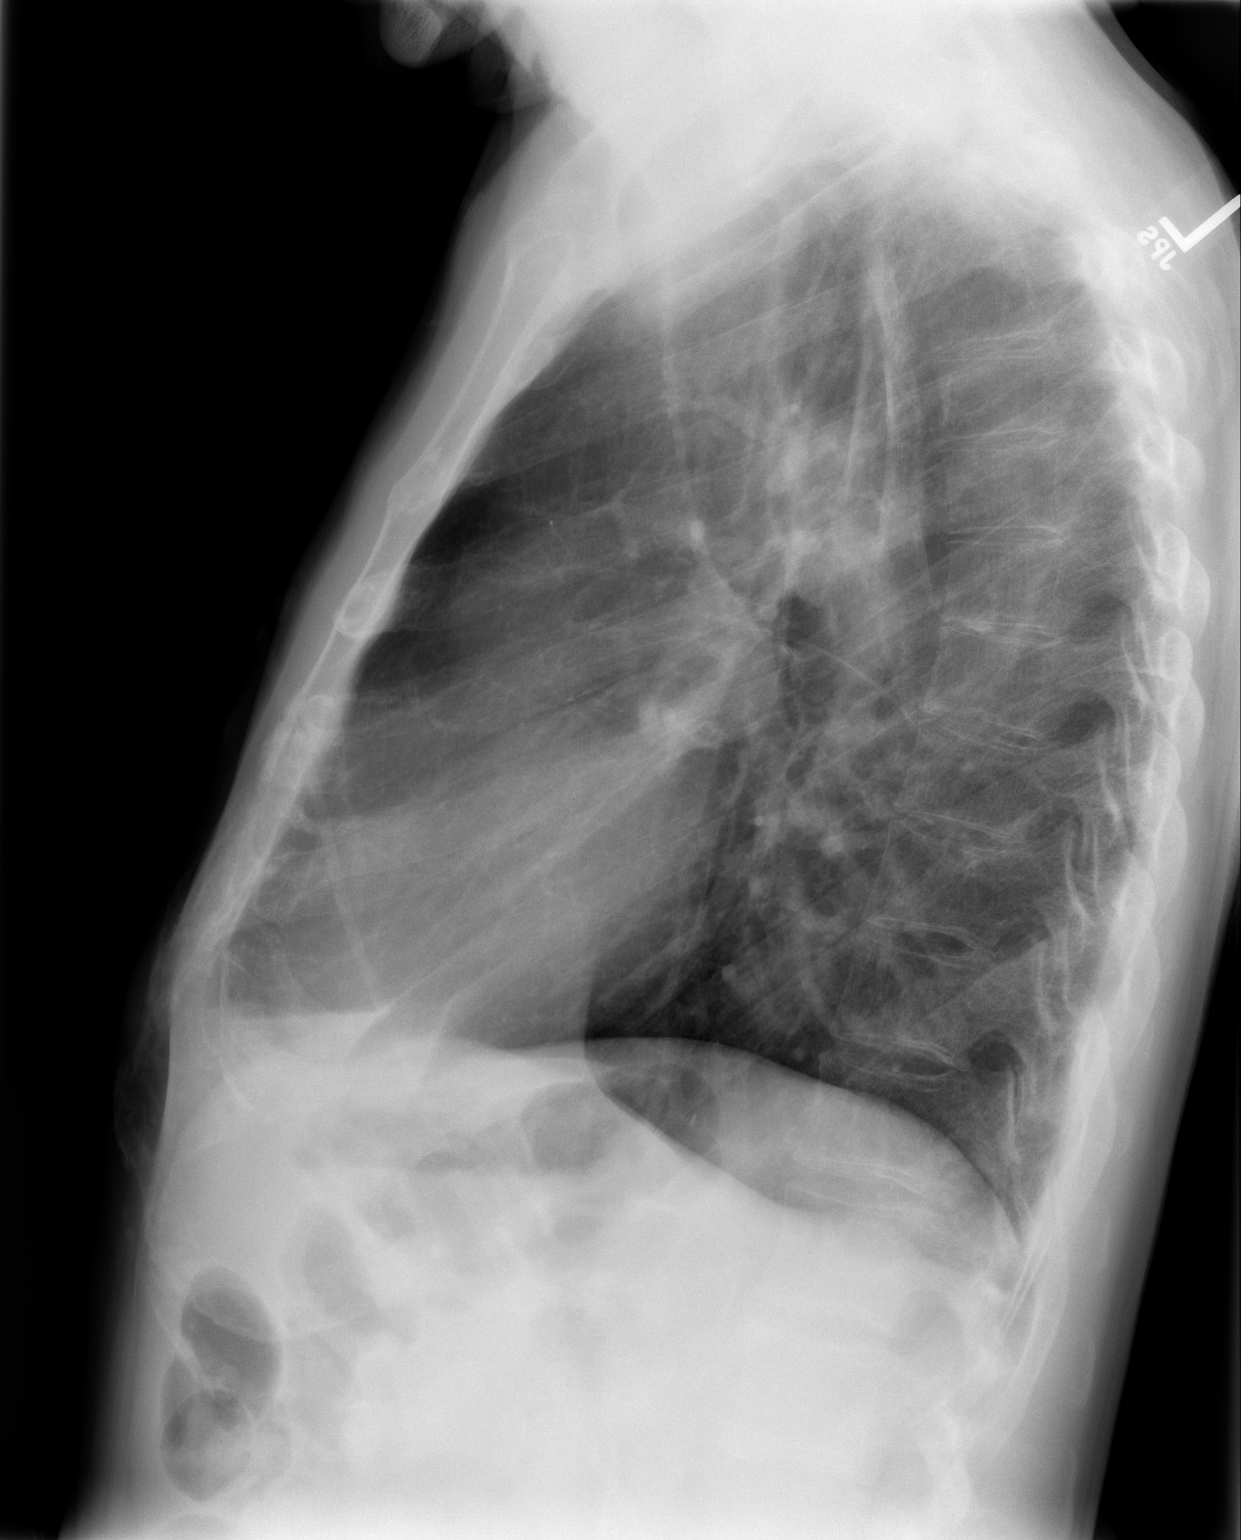

[2 of 2 positions shown; findings below may reference images not displayed]

FINDINGS: Innumerable sub centimeter nodular densities project over
both upper lung zones.  Lungs are hyperaerated.  Upper normal heart
size.  No pneumothorax.  No pleural effusion.
IMPRESSION: Upper lung zone bilateral nodular densities.  Pulmonary nodules are
not excluded.  Comparison with prior films or CT chest are
recommended.

## 2014-06-24 ENCOUNTER — Emergency Department (HOSPITAL_COMMUNITY)
Admission: EM | Admit: 2014-06-24 | Discharge: 2014-06-24 | Disposition: A | Discharge status: Home / Self Care | Payer: Worker's Compensation | Attending: Emergency Medicine | Admitting: Emergency Medicine

## 2014-06-24 ENCOUNTER — Encounter (HOSPITAL_COMMUNITY): Payer: Self-pay | Admitting: *Deleted

## 2014-06-24 DIAGNOSIS — Z880 Allergy status to penicillin: Secondary | ICD-10-CM | POA: Insufficient documentation

## 2014-06-24 DIAGNOSIS — Y9301 Activity, walking, marching and hiking: Secondary | ICD-10-CM | POA: Diagnosis not present

## 2014-06-24 DIAGNOSIS — T1591XA Foreign body on external eye, part unspecified, right eye, initial encounter: Secondary | ICD-10-CM

## 2014-06-24 DIAGNOSIS — Y9289 Other specified places as the place of occurrence of the external cause: Secondary | ICD-10-CM | POA: Diagnosis not present

## 2014-06-24 DIAGNOSIS — T1592XA Foreign body on external eye, part unspecified, left eye, initial encounter: Secondary | ICD-10-CM | POA: Diagnosis present

## 2014-06-24 DIAGNOSIS — X58XXXA Exposure to other specified factors, initial encounter: Secondary | ICD-10-CM | POA: Insufficient documentation

## 2014-06-24 DIAGNOSIS — T1511XA Foreign body in conjunctival sac, right eye, initial encounter: Secondary | ICD-10-CM | POA: Diagnosis not present

## 2014-06-24 DIAGNOSIS — S0501XA Injury of conjunctiva and corneal abrasion without foreign body, right eye, initial encounter: Secondary | ICD-10-CM

## 2014-06-24 DIAGNOSIS — Y998 Other external cause status: Secondary | ICD-10-CM | POA: Insufficient documentation

## 2014-06-24 MED ORDER — FLUORESCEIN SODIUM 1 MG OP STRP
1.0000 | ORAL_STRIP | Freq: Once | OPHTHALMIC | Status: AC
  Administered 2014-06-24: 1 via OPHTHALMIC
  Filled 2014-06-24: qty 1

## 2014-06-24 MED ORDER — ERYTHROMYCIN 5 MG/GM OP OINT
1.0000 "application " | TOPICAL_OINTMENT | Freq: Four times a day (QID) | OPHTHALMIC | Status: DC
  Administered 2014-06-24: 1 via OPHTHALMIC
  Filled 2014-06-24: qty 3.5

## 2014-06-24 MED ORDER — TETANUS-DIPHTH-ACELL PERTUSSIS 5-2.5-18.5 LF-MCG/0.5 IM SUSP
0.5000 mL | Freq: Once | INTRAMUSCULAR | Status: AC
  Administered 2014-06-24: 0.5 mL via INTRAMUSCULAR
  Filled 2014-06-24: qty 0.5

## 2014-06-24 MED ORDER — TETRACAINE HCL 0.5 % OP SOLN
2.0000 [drp] | Freq: Once | OPHTHALMIC | Status: AC
  Administered 2014-06-24: 2 [drp] via OPHTHALMIC
  Filled 2014-06-24: qty 2

## 2014-06-24 NOTE — ED Notes (Signed)
Will call Phlebotomy for follow up on UDS  (for worker's compensation claim)

## 2014-06-24 NOTE — ED Provider Notes (Signed)
Medical screening examination/treatment/procedure(s) were conducted as a shared visit with non-physician practitioner(s) and myself.  I personally evaluated the patient during the encounter.  Pt with small foreign body in the eye.  Small particle removed after irrigation and q tip swab. Sx improved after treatment.  Dorie Rank, MD 06/24/14 [phone]

## 2014-06-24 NOTE — ED Notes (Signed)
Pt reports while weed eating today had object hit eye. Unsure what it was. Redness, irritation, tearful since.

## 2014-06-24 NOTE — ED Notes (Signed)
Per PA, pt's eye irrigated with normal saline.

## 2014-06-24 NOTE — ED Notes (Signed)
Pt A&OX4, ambulatory at d/c with steady gait, NAD 

## 2014-06-24 NOTE — ED Provider Notes (Signed)
CSN: [id number]     Arrival date & time 06/24/14  [id number] History  This chart was scribed for Brent General, PA-C, working with Dorie Rank, MD by Steva Colder, ED Scribe. The patient was seen in room TR04C/TR04C at 8:28 PM.    Chief Complaint  Patient presents with  . Foreign Body in Eye      The history is provided by the patient. No language interpreter was used.    HPI Comments: Keith [name] is a 74 y.o. male who presents to the Emergency Department complaining of FB in right eye onset today PTA. He reports that he was walking out of the office from work when something that he is unsure of went into his eye. Pt works Land at [employer] He reports that his eye has been irritated since then. He states that he is having associated symptoms of eye redness, eye irritation, and watery eyes. He denies visual changes and any other symptoms. Pt is unsure of when his last tetanus shot was.    History reviewed. No pertinent past medical history. History reviewed. No pertinent past surgical history. No family history on file. History  Substance Use Topics  . Smoking status: Never Smoker   . Smokeless tobacco: Not on file  . Alcohol Use: No    Review of Systems  Eyes: Positive for redness. Negative for photophobia and visual disturbance.       Watery eye    Allergies  Penicillins  Home Medications   Prior to Admission medications   Not on File   BP 142/46 mmHg  Pulse 56  Temp(Src) 97.5 F (36.4 C) (Oral)  Resp 16  SpO2 100% Physical Exam  Constitutional: He is oriented to person, place, and time. He appears well-developed and well-nourished. No distress.  HENT:  Head: Normocephalic and atraumatic.  Eyes: Conjunctivae and EOM are normal. Pupils are equal, round, and reactive to light. Right eye exhibits discharge. Right eye exhibits no chemosis and no exudate. Foreign body present in the right eye. No scleral icterus.  Slit lamp exam:      The right eye shows corneal  abrasion and fluorescein uptake. The right eye shows no corneal flare, no corneal ulcer, no foreign body, no hyphema and no hypopyon.  4 seen uptake noted at 3:00 position of right eye cornea. Uptake consistent with corneal abrasion. Status post irrigation, small, punctate foreign body noted on interior upper eyelid with eversion.  Neck: Neck supple. No tracheal deviation present.  Cardiovascular: Normal rate.   Pulmonary/Chest: Effort normal. No respiratory distress.  Musculoskeletal: Normal range of motion.  Neurological: He is alert and oriented to person, place, and time.  Skin: Skin is warm and dry.  Psychiatric: He has a normal mood and affect. His behavior is normal.  Nursing note and vitals reviewed.   ED Course  FOREIGN BODY REMOVAL Date/Time: 06/24/2014 10:05 PM Performed by: Dahlia Bailiff Authorized by: Dahlia Bailiff Consent: Verbal consent obtained. Consent given by: patient Patient identity confirmed: verbally with patient Time out: Immediately prior to procedure a "time out" was called to verify the correct patient, procedure, equipment, support staff and site/side marked as required. Body area: eye Location details: right eyelid Patient sedated: no Patient restrained: no Patient cooperative: yes Localization method: eyelid eversion and visualized Removal mechanism: moist cotton swab Eye examined with fluorescein. Fluorescein uptake. Corneal abrasion size: small Corneal abrasion location: medial Dressing: antibiotic ointment Depth: superficial Complexity: simple 1 objects recovered. Post-procedure assessment: foreign body removed Patient tolerance:  Patient tolerated the procedure well with no immediate complications   (including critical care time) DIAGNOSTIC STUDIES: Oxygen Saturation is 97% on RA, nl by my interpretation.    COORDINATION OF CARE: 8:32 PM-Discussed treatment plan which includes visual acuity, tetanus injection, eye irrigation, abx eye ointment, f/u  with eye doctor, with pt at bedside and pt agreed to plan.   Labs Review Labs Reviewed  URINE RAPID DRUG SCREEN (HOSP PERFORMED) NOT AT [facility]    Imaging Review No results found.   EKG Interpretation None      MDM   Final diagnoses:  Corneal abrasion, right, initial encounter  Foreign body in eye, right, initial encounter    Pt with corneal abrasion on PE secondary to foreign body.. Tdap given. Eye irrigated w NS, foreign body removed as noted in procedure note above.  No change in vision, acuity equal bilaterally.  Patient has poor visual acuity, which patient states is baseline for him secondary to cataracts. Pt is not a contact lens wearer.  Exam non-concerning for orbital cellulitis, hyphema, corneal ulcers. Patient will be discharged home with erythromycin.   Patient understands to follow up with ophthalmology, & to return to ER if new symptoms develop including change in vision, purulent drainage, or entrapment.  I personally performed the services described in this documentation, which was scribed in my presence. The recorded information has been reviewed and is accurate.  BP 142/46 mmHg  Pulse 56  Temp(Src) 97.5 F (36.4 C) (Oral)  Resp 16  SpO2 100%  Signed,  Dahlia Bailiff, PA-C 10:08 PM  Patient seen and discussed with Dr. Dorie Rank, M.D.   Dahlia Bailiff, PA-C 06/24/14 2208

## 2014-06-24 NOTE — Discharge Instructions (Signed)
Corneal Abrasion °The cornea is the clear covering at the front and center of the eye. When looking at the colored portion of the eye (iris), you are looking through the cornea. This very thin tissue is made up of many layers. The surface layer is a single layer of cells (corneal epithelium) and is one of the most sensitive tissues in the body. If a scratch or injury causes the corneal epithelium to come off, it is called a corneal abrasion. If the injury extends to the tissues below the epithelium, the condition is called a corneal ulcer. °CAUSES  °· Scratches. °· Trauma. °· Foreign body in the eye. °Some people have recurrences of abrasions in the area of the original injury even after it has healed (recurrent erosion syndrome). Recurrent erosion syndrome generally improves and goes away with time. °SYMPTOMS  °· Eye pain. °· Difficulty or inability to keep the injured eye open. °· The eye becomes very sensitive to light. °· Recurrent erosions tend to happen suddenly, first thing in the morning, usually after waking up and opening the eye. °DIAGNOSIS  °Your health care provider can diagnose a corneal abrasion during an eye exam. Dye is usually placed in the eye using a drop or a small paper strip moistened by your tears. When the eye is examined with a special light, the abrasion shows up clearly because of the dye. °TREATMENT  °· Small abrasions may be treated with antibiotic drops or ointment alone. °· A pressure patch may be put over the eye. If this is done, follow your doctor's instructions for when to remove the patch. Do not drive or use machines while the eye patch is on. Judging distances is hard to do with a patch on. °If the abrasion becomes infected and spreads to the deeper tissues of the cornea, a corneal ulcer can result. This is serious because it can cause corneal scarring. Corneal scars interfere with light passing through the cornea and cause a loss of vision in the involved eye. °HOME CARE  INSTRUCTIONS °· Use medicine or ointment as directed. Only take over-the-counter or prescription medicines for pain, discomfort, or fever as directed by your health care provider. °· Do not drive or operate machinery if your eye is patched. Your ability to judge distances is impaired. °· If your health care provider has given you a follow-up appointment, it is very important to keep that appointment. Not keeping the appointment could result in a severe eye infection or permanent loss of vision. If there is any problem keeping the appointment, let your health care provider know. °SEEK MEDICAL CARE IF:  °· You have pain, light sensitivity, and a scratchy feeling in one eye or both eyes. °· Your pressure patch keeps loosening up, and you can blink your eye under the patch after treatment. °· Any kind of discharge develops from the eye after treatment or if the lids stick together in the morning. °· You have the same symptoms in the morning as you did with the original abrasion days, weeks, or months after the abrasion healed. °MAKE SURE YOU:  °· Understand these instructions. °· Will watch your condition. °· Will get help right away if you are not doing well or get worse. °Document Released: 12/31/1999 Document Revised: 01/07/2013 Document Reviewed: 09/09/2012 °[employer]® Patient Information ©2015 [employer]. This information is not intended to replace advice given to you by your health care provider. Make sure you discuss any questions you have with your health care provider. ° °

## 2015-07-31 DIAGNOSIS — H2513 Age-related nuclear cataract, bilateral: Secondary | ICD-10-CM | POA: Diagnosis not present

## 2016-11-16 DIAGNOSIS — I639 Cerebral infarction, unspecified: Secondary | ICD-10-CM

## 2016-11-16 HISTORY — DX: Cerebral infarction, unspecified: I63.9

## 2016-11-19 ENCOUNTER — Encounter (HOSPITAL_COMMUNITY): Payer: Self-pay | Admitting: Emergency Medicine

## 2016-11-19 ENCOUNTER — Emergency Department (HOSPITAL_COMMUNITY): Payer: Medicare Other

## 2016-11-19 ENCOUNTER — Inpatient Hospital Stay (HOSPITAL_COMMUNITY)
Admission: EM | Admit: 2016-11-19 | Discharge: 2016-11-23 | DRG: 040 | Disposition: A | Discharge status: Rehabilitation Facility | Payer: Medicare Other | Attending: Neurology | Admitting: Neurology

## 2016-11-19 DIAGNOSIS — D62 Acute posthemorrhagic anemia: Secondary | ICD-10-CM

## 2016-11-19 DIAGNOSIS — D492 Neoplasm of unspecified behavior of bone, soft tissue, and skin: Secondary | ICD-10-CM | POA: Diagnosis present

## 2016-11-19 DIAGNOSIS — R739 Hyperglycemia, unspecified: Secondary | ICD-10-CM

## 2016-11-19 DIAGNOSIS — N183 Chronic kidney disease, stage 3 unspecified: Secondary | ICD-10-CM

## 2016-11-19 DIAGNOSIS — Z880 Allergy status to penicillin: Secondary | ICD-10-CM

## 2016-11-19 DIAGNOSIS — R4701 Aphasia: Secondary | ICD-10-CM | POA: Diagnosis present

## 2016-11-19 DIAGNOSIS — I609 Nontraumatic subarachnoid hemorrhage, unspecified: Secondary | ICD-10-CM

## 2016-11-19 DIAGNOSIS — R220 Localized swelling, mass and lump, head: Secondary | ICD-10-CM

## 2016-11-19 DIAGNOSIS — E785 Hyperlipidemia, unspecified: Secondary | ICD-10-CM | POA: Diagnosis present

## 2016-11-19 DIAGNOSIS — R29810 Facial weakness: Secondary | ICD-10-CM | POA: Diagnosis present

## 2016-11-19 DIAGNOSIS — I129 Hypertensive chronic kidney disease with stage 1 through stage 4 chronic kidney disease, or unspecified chronic kidney disease: Secondary | ICD-10-CM | POA: Diagnosis present

## 2016-11-19 DIAGNOSIS — G8194 Hemiplegia, unspecified affecting left nondominant side: Secondary | ICD-10-CM | POA: Diagnosis present

## 2016-11-19 DIAGNOSIS — R001 Bradycardia, unspecified: Secondary | ICD-10-CM | POA: Diagnosis present

## 2016-11-19 DIAGNOSIS — Z801 Family history of malignant neoplasm of trachea, bronchus and lung: Secondary | ICD-10-CM

## 2016-11-19 DIAGNOSIS — I69391 Dysphagia following cerebral infarction: Secondary | ICD-10-CM

## 2016-11-19 DIAGNOSIS — I639 Cerebral infarction, unspecified: Secondary | ICD-10-CM

## 2016-11-19 DIAGNOSIS — I63232 Cerebral infarction due to unspecified occlusion or stenosis of left carotid arteries: Secondary | ICD-10-CM

## 2016-11-19 DIAGNOSIS — R269 Unspecified abnormalities of gait and mobility: Secondary | ICD-10-CM

## 2016-11-19 DIAGNOSIS — R29706 NIHSS score 6: Secondary | ICD-10-CM | POA: Diagnosis not present

## 2016-11-19 DIAGNOSIS — R471 Dysarthria and anarthria: Secondary | ICD-10-CM | POA: Diagnosis present

## 2016-11-19 DIAGNOSIS — R29703 NIHSS score 3: Secondary | ICD-10-CM | POA: Diagnosis present

## 2016-11-19 DIAGNOSIS — R402412 Glasgow coma scale score 13-15, at arrival to emergency department: Secondary | ICD-10-CM | POA: Diagnosis present

## 2016-11-19 DIAGNOSIS — I351 Nonrheumatic aortic (valve) insufficiency: Secondary | ICD-10-CM | POA: Diagnosis present

## 2016-11-19 DIAGNOSIS — R2689 Other abnormalities of gait and mobility: Secondary | ICD-10-CM | POA: Diagnosis not present

## 2016-11-19 DIAGNOSIS — E039 Hypothyroidism, unspecified: Secondary | ICD-10-CM | POA: Diagnosis present

## 2016-11-19 DIAGNOSIS — I454 Nonspecific intraventricular block: Secondary | ICD-10-CM | POA: Diagnosis present

## 2016-11-19 DIAGNOSIS — I63412 Cerebral infarction due to embolism of left middle cerebral artery: Secondary | ICD-10-CM | POA: Diagnosis not present

## 2016-11-19 HISTORY — DX: Obstructive and reflux uropathy, unspecified: N13.9

## 2016-11-19 HISTORY — DX: Hypothyroidism, unspecified: E03.9

## 2016-11-19 LAB — CBC WITH DIFFERENTIAL/PLATELET
Basophils Absolute: 0 10*3/uL (ref 0.0–0.1)
Basophils Relative: 0 %
Eosinophils Absolute: 0.2 10*3/uL (ref 0.0–0.7)
Eosinophils Relative: 3 %
HCT: 40.2 % (ref 39.0–52.0)
Hemoglobin: 12.9 g/dL — ABNORMAL LOW (ref 13.0–17.0)
Lymphocytes Relative: 22 %
Lymphs Abs: 1.6 10*3/uL (ref 0.7–4.0)
MCH: 29.4 pg (ref 26.0–34.0)
MCHC: 32.1 g/dL (ref 30.0–36.0)
MCV: 91.6 fL (ref 78.0–100.0)
Monocytes Absolute: 0.5 10*3/uL (ref 0.1–1.0)
Monocytes Relative: 7 %
Neutro Abs: 4.9 10*3/uL (ref 1.7–7.7)
Neutrophils Relative %: 68 %
Platelets: 239 10*3/uL (ref 150–400)
RBC: 4.39 MIL/uL (ref 4.22–5.81)
RDW: 13.6 % (ref 11.5–15.5)
WBC: 7.2 10*3/uL (ref 4.0–10.5)

## 2016-11-19 LAB — I-STAT TROPONIN, ED: Troponin i, poc: 0 ng/mL (ref 0.00–0.08)

## 2016-11-19 LAB — COMPREHENSIVE METABOLIC PANEL
ALT: 10 U/L — ABNORMAL LOW (ref 17–63)
AST: 15 U/L (ref 15–41)
Albumin: 3.5 g/dL (ref 3.5–5.0)
Alkaline Phosphatase: 89 U/L (ref 38–126)
Anion gap: 8 (ref 5–15)
BUN: 28 mg/dL — ABNORMAL HIGH (ref 6–20)
CO2: 22 mmol/L (ref 22–32)
Calcium: 8.9 mg/dL (ref 8.9–10.3)
Chloride: 107 mmol/L (ref 101–111)
Creatinine, Ser: 2.38 mg/dL — ABNORMAL HIGH (ref 0.61–1.24)
GFR calc Af Amer: 29 mL/min — ABNORMAL LOW (ref >60)
GFR calc non Af Amer: 25 mL/min — ABNORMAL LOW (ref >60)
Glucose, Bld: 133 mg/dL — ABNORMAL HIGH (ref 65–99)
Potassium: 4.3 mmol/L (ref 3.5–5.1)
Sodium: 137 mmol/L (ref 135–145)
Total Bilirubin: 0.5 mg/dL (ref 0.3–1.2)
Total Protein: 7 g/dL (ref 6.5–8.1)

## 2016-11-19 LAB — PROTIME-INR
INR: 0.98
Prothrombin Time: 12.9 seconds (ref 11.4–15.2)

## 2016-11-19 LAB — APTT: aPTT: 31 seconds (ref 24–36)

## 2016-11-19 NOTE — ED Notes (Signed)
Patient transported to CT 

## 2016-11-19 NOTE — ED Provider Notes (Signed)
[facility] Provider Note   CSN: [id number] Arrival date & time: 11/19/16  1853     History   Chief Complaint Chief Complaint  Patient presents with  . Unsteady Gait    Ear discomfort    HPI Keith [name] is a 76 y.o. male.  76 year old male with no significant past medical history presents to the emergency department for evaluation of gait difficulty.  Patient states that he went to bed and "felt fine".  He woke this morning with difficulty ambulating.  He also states that he feels "weak".  He usually walks 3-5 miles each evening.  He is not ambulatory with a cane or walker at baseline.  He has had difficulty with ambulation without assistance since this morning.  No recent falls.  Patient also feels as though it is difficult for him to convey his thoughts.  Son has noticed some increased slurred speech and "mumbling".  Patient does report preceding "roaring" and "wooshing" in his bilateral ears over the past 3-4 days which is brought on, most often, when supine. Patient denies any recent fevers.  He has not had any headache, complete hearing loss, or blurry vision.  No extremity numbness or paresthesias.  He denies a known history of hypertension, but is not actively followed by primary care doctor.  He denies use of chronic anticoagulation.   He does have a mass to the top of his head. He has had a "bruise" to this area since a fall a few years ago; however, has noted increased swelling over the past few months. He is not sure if this is responsible for his symptoms today.      History reviewed. No pertinent past medical history.  There are no active problems to display for this patient.   History reviewed. No pertinent surgical history.     Home Medications    Prior to Admission medications   Not on File    Family History No family history on file.  Social History Social History   Tobacco Use  . Smoking status: Never  Smoker  . Smokeless tobacco: Never Used  Substance Use Topics  . Alcohol use: No  . Drug use: No     Allergies   Penicillins   Review of Systems Review of Systems Ten systems reviewed and are negative for acute change, except as noted in the HPI.    Physical Exam Updated Vital Signs BP (!) 130/55   Pulse 66   Temp 98.6 F (37 C) (Oral)   Resp 16   Ht 5\' 9"  (1.753 m)   Wt 63.5 kg (140 lb)   SpO2 97%   BMI 20.67 kg/m   Physical Exam  Constitutional: He is oriented to person, place, and time. He appears well-developed and well-nourished. No distress.  Nontoxic appearing and in NAD  HENT:  Head: Normocephalic.  Right Ear: Tympanic membrane, external ear and ear canal normal.  Left Ear: Tympanic membrane, external ear and ear canal normal.  Mouth/Throat: Uvula is midline and oropharynx is clear and moist.  Nontender, ecchymotic and erythematous mass to top of scalp, 4cm in diameter, with associated telangectasias - concerning for potential cancerous etiology. Symmetric rise of the uvula with phonation.  Eyes: Conjunctivae and EOM are normal. Pupils are equal, round, and reactive to light. No scleral icterus.  EOMs normal. No nystagmus noted.  Neck: Normal range of motion.  No meningismus  Cardiovascular: Normal rate, regular rhythm and intact distal pulses.  Pulmonary/Chest: Effort normal. No stridor. No respiratory distress.  Respirations even and unlabored  Musculoskeletal: Normal range of motion.  Neurological: He is alert and oriented to person, place, and time. A cranial nerve deficit is present. He exhibits normal muscle tone.  GCS 15. Mildly aphasic with slight right sided facial droop. + pronator drift on the right. Shuffling-type gait. No sensory changes. No significant weakness appreciated.  Skin: Skin is warm and dry. No rash noted. He is not diaphoretic. No erythema. No pallor.  Psychiatric: He has a normal mood and affect. His behavior is normal.  Nursing  note and vitals reviewed.    ED Treatments / Results  Labs (all labs ordered are listed, but only abnormal results are displayed) Labs Reviewed  CBC WITH DIFFERENTIAL/PLATELET - Abnormal; Notable for the following components:      Result Value   Hemoglobin 12.9 (*)    All other components within normal limits  COMPREHENSIVE METABOLIC PANEL - Abnormal; Notable for the following components:   Glucose, Bld 133 (*)    BUN 28 (*)    Creatinine, Ser 2.38 (*)    ALT 10 (*)    GFR calc non Af Amer 25 (*)    GFR calc Af Amer 29 (*)    All other components within normal limits  PROTIME-INR  APTT  I-STAT TROPONIN, ED    EKG  EKG Interpretation None       Radiology Ct Head Wo Contrast  Result Date: 11/19/2016 CLINICAL DATA:  Unsteady gait, dizziness and tinnitus beginning this morning. EXAM: CT HEAD WITHOUT CONTRAST TECHNIQUE: Contiguous axial images were obtained from the base of the skull through the vertex without intravenous contrast. COMPARISON:  CT HEAD July 08, 2010 FINDINGS: BRAIN: No intraparenchymal hemorrhage, mass effect nor midline shift. The ventricles and sulci are normal for age. Patchy supratentorial white matter hypodensities less than expected for patient's age, though non-specific are most compatible with chronic small vessel ischemic disease. No acute large vascular territory infarcts. No abnormal extra-axial fluid collections. Basal cisterns are patent. VASCULAR: Moderate calcific atherosclerosis of the carotid siphons. SKULL: No skull fracture. New 2.6 cm soft tissue solid mass LEFT frontal vertex scalp, no underlying osseous abnormality. SINUSES/ORBITS: Paranasal sinus are well aerated. Under pneumatized LEFT mastoid air cells.The included ocular globes and orbital contents are non-suspicious. Similar enophthalmos. OTHER: Patient is edentulous . IMPRESSION: 1. Trace LEFT frontal acute to subacute subarachnoid hemorrhage. Recommend MRI of the brain on nonemergent basis.  2. Otherwise negative CT HEAD for age. 3. New 2.5 cm soft tissue mass LEFT frontal scalp concerning for skin cancer. Recommend dermatologic consultation on a nonemergent basis. 4. Critical Value/emergent results were called by telephone at the time of interpretation on 11/19/2016 at 11:25 pm to Dr. Dorie Rank , who verbally acknowledged these results. Electronically Signed   By: Elon Alas M.D.   On: 11/19/2016 23:27    Procedures Procedures (including critical care time)  Medications Ordered in ED Medications - No data to display   CRITICAL CARE Performed by: Antonietta Breach   Total critical care time: 40 minutes  Critical care time was exclusive of separately billable procedures and treating other patients.  Critical care was necessary to treat or prevent imminent or life-threatening deterioration.  Critical care was time spent personally by me on the following activities: development of treatment plan with patient and/or surrogate as well as nursing, discussions with consultants, evaluation of patient's response to treatment, examination of patient, obtaining history from patient or surrogate,  ordering and performing treatments and interventions, ordering and review of laboratory studies, ordering and review of radiographic studies, pulse oximetry and re-evaluation of patient's condition.   Initial Impression / Assessment and Plan / ED Course  I have reviewed the triage vital signs and the nursing notes.  Pertinent labs & imaging results that were available during my care of the patient were reviewed by me and considered in my medical decision making (see chart for details).     19:76 PM 76 year old male presents to the emergency department for new onset of gait difficulty this morning.  He has a right pronator drift and mild right downturning to the corner of his mouth on neurologic exam.  Gait is also unsteady, slightly shuffled.  Patient has movements of more mumbled speech as  well. Plan for head CT. LSN yesterday PM.  11:35 PM Plan for admission to unassigned medical service for further work up. Case also discussed with Dr. Leonel Ramsay given presence of changes to neurologic exam with fairly underwhelming CT results.  It is still unclear why a trace amount of subarachnoid hemorrhage is present in the left frontal region, though this would correlate with right-sided deficits. No hx of trauma or fall. Question underlying malignancy, not seen on non-contrasted scan, given presence of scalp soft tissue mass concerning for skin cancer.  11:40 PM Dr. Leonel Ramsay at bedside. Page placed for unassigned admit.  12:28 AM Case discussed with Dr. Alcario Drought who will admit.  3:02 AM MRI positive for CVA, left MCA territory and nonhemorrhagic. Case assumed by Dr. Leonel Ramsay, Neurology.   Final Clinical Impressions(s) / ED Diagnoses   Final diagnoses:  Gait disturbance  Subarachnoid hemorrhage [facility])  Cerebrovascular accident (CVA), unspecified mechanism [facility])    ED Discharge Orders    None       Antonietta Breach, PA-C 11/20/16 0029    Antonietta Breach, PA-C 11/20/16 0303    Antonietta Breach, PA-C 11/20/16 0303    Antonietta Breach, PA-C 11/20/16 [id number]    Dorie Rank, MD 11/21/16 [phone]

## 2016-11-19 NOTE — ED Triage Notes (Signed)
Patient reports unsteady gait/dizziness and " rushing in ears" onset this morning , alert and oriented , speech clear/no facial asymmetry , equal strong grips with no arm drift.

## 2016-11-20 ENCOUNTER — Encounter (HOSPITAL_COMMUNITY): Payer: Self-pay | Admitting: Internal Medicine

## 2016-11-20 ENCOUNTER — Inpatient Hospital Stay (HOSPITAL_COMMUNITY): Payer: Medicare Other

## 2016-11-20 ENCOUNTER — Emergency Department (HOSPITAL_COMMUNITY): Payer: Medicare Other

## 2016-11-20 ENCOUNTER — Other Ambulatory Visit: Payer: Self-pay

## 2016-11-20 DIAGNOSIS — D62 Acute posthemorrhagic anemia: Secondary | ICD-10-CM

## 2016-11-20 DIAGNOSIS — I69391 Dysphagia following cerebral infarction: Secondary | ICD-10-CM

## 2016-11-20 DIAGNOSIS — I609 Nontraumatic subarachnoid hemorrhage, unspecified: Secondary | ICD-10-CM | POA: Diagnosis present

## 2016-11-20 DIAGNOSIS — I63412 Cerebral infarction due to embolism of left middle cerebral artery: Secondary | ICD-10-CM | POA: Diagnosis present

## 2016-11-20 DIAGNOSIS — I351 Nonrheumatic aortic (valve) insufficiency: Secondary | ICD-10-CM | POA: Diagnosis present

## 2016-11-20 DIAGNOSIS — R471 Dysarthria and anarthria: Secondary | ICD-10-CM | POA: Diagnosis present

## 2016-11-20 DIAGNOSIS — I129 Hypertensive chronic kidney disease with stage 1 through stage 4 chronic kidney disease, or unspecified chronic kidney disease: Secondary | ICD-10-CM | POA: Diagnosis present

## 2016-11-20 DIAGNOSIS — E785 Hyperlipidemia, unspecified: Secondary | ICD-10-CM | POA: Diagnosis present

## 2016-11-20 DIAGNOSIS — I69351 Hemiplegia and hemiparesis following cerebral infarction affecting right dominant side: Secondary | ICD-10-CM | POA: Diagnosis not present

## 2016-11-20 DIAGNOSIS — I639 Cerebral infarction, unspecified: Secondary | ICD-10-CM

## 2016-11-20 DIAGNOSIS — E039 Hypothyroidism, unspecified: Secondary | ICD-10-CM

## 2016-11-20 DIAGNOSIS — I63232 Cerebral infarction due to unspecified occlusion or stenosis of left carotid arteries: Secondary | ICD-10-CM | POA: Diagnosis present

## 2016-11-20 DIAGNOSIS — R269 Unspecified abnormalities of gait and mobility: Secondary | ICD-10-CM

## 2016-11-20 DIAGNOSIS — D492 Neoplasm of unspecified behavior of bone, soft tissue, and skin: Secondary | ICD-10-CM | POA: Diagnosis present

## 2016-11-20 DIAGNOSIS — N183 Chronic kidney disease, stage 3 unspecified: Secondary | ICD-10-CM

## 2016-11-20 DIAGNOSIS — R402412 Glasgow coma scale score 13-15, at arrival to emergency department: Secondary | ICD-10-CM | POA: Diagnosis present

## 2016-11-20 DIAGNOSIS — R4701 Aphasia: Secondary | ICD-10-CM | POA: Diagnosis present

## 2016-11-20 DIAGNOSIS — R739 Hyperglycemia, unspecified: Secondary | ICD-10-CM

## 2016-11-20 DIAGNOSIS — D1801 Hemangioma of skin and subcutaneous tissue: Secondary | ICD-10-CM | POA: Diagnosis not present

## 2016-11-20 DIAGNOSIS — R29706 NIHSS score 6: Secondary | ICD-10-CM | POA: Diagnosis not present

## 2016-11-20 DIAGNOSIS — G8194 Hemiplegia, unspecified affecting left nondominant side: Secondary | ICD-10-CM | POA: Diagnosis present

## 2016-11-20 DIAGNOSIS — Z801 Family history of malignant neoplasm of trachea, bronchus and lung: Secondary | ICD-10-CM | POA: Diagnosis not present

## 2016-11-20 DIAGNOSIS — I6522 Occlusion and stenosis of left carotid artery: Secondary | ICD-10-CM | POA: Diagnosis not present

## 2016-11-20 DIAGNOSIS — I454 Nonspecific intraventricular block: Secondary | ICD-10-CM | POA: Diagnosis present

## 2016-11-20 DIAGNOSIS — I63512 Cerebral infarction due to unspecified occlusion or stenosis of left middle cerebral artery: Secondary | ICD-10-CM

## 2016-11-20 DIAGNOSIS — R001 Bradycardia, unspecified: Secondary | ICD-10-CM | POA: Diagnosis present

## 2016-11-20 DIAGNOSIS — E890 Postprocedural hypothyroidism: Secondary | ICD-10-CM

## 2016-11-20 DIAGNOSIS — R29810 Facial weakness: Secondary | ICD-10-CM | POA: Diagnosis present

## 2016-11-20 DIAGNOSIS — R29703 NIHSS score 3: Secondary | ICD-10-CM | POA: Diagnosis present

## 2016-11-20 DIAGNOSIS — Z880 Allergy status to penicillin: Secondary | ICD-10-CM | POA: Diagnosis not present

## 2016-11-20 HISTORY — DX: Postprocedural hypothyroidism: E89.0

## 2016-11-20 LAB — VAS US CAROTID
LEFT ECA DIAS: -12 cm/s
LEFT VERTEBRAL DIAS: -10 cm/s
Left CCA dist dias: 8 cm/s
Left CCA dist sys: 33 cm/s
Left CCA prox dias: 8 cm/s
Left CCA prox sys: 43 cm/s
RIGHT ECA DIAS: 14 cm/s
RIGHT VERTEBRAL DIAS: -14 cm/s
Right CCA prox dias: 17 cm/s
Right CCA prox sys: 86 cm/s
Right cca dist sys: -102 cm/s

## 2016-11-20 LAB — LIPID PANEL
Cholesterol: 183 mg/dL (ref 0–200)
HDL: 35 mg/dL — ABNORMAL LOW (ref >40)
LDL Cholesterol: 125 mg/dL — ABNORMAL HIGH (ref 0–99)
Total CHOL/HDL Ratio: 5.2 RATIO
Triglycerides: 115 mg/dL (ref <150)
VLDL: 23 mg/dL (ref 0–40)

## 2016-11-20 LAB — MRSA PCR SCREENING: MRSA by PCR: NEGATIVE

## 2016-11-20 LAB — HEMOGLOBIN A1C
Hgb A1c MFr Bld: 5.5 % (ref 4.8–5.6)
Mean Plasma Glucose: 111.15 mg/dL

## 2016-11-20 LAB — TSH: TSH: 5.17 u[IU]/mL — ABNORMAL HIGH (ref 0.350–4.500)

## 2016-11-20 MED ORDER — ONDANSETRON HCL 4 MG/2ML IJ SOLN: 4.0000 mg | Freq: Four times a day (QID) | INTRAMUSCULAR | Status: DC | PRN

## 2016-11-20 MED ORDER — ATORVASTATIN CALCIUM 40 MG PO TABS
40.0000 mg | ORAL_TABLET | Freq: Every day | ORAL | Status: DC
  Administered 2016-11-20 – 2016-11-22 (×3): 40 mg via ORAL
  Filled 2016-11-20 (×3): qty 1

## 2016-11-20 MED ORDER — ACETAMINOPHEN 325 MG PO TABS: 650.0000 mg | ORAL_TABLET | Freq: Four times a day (QID) | ORAL | Status: DC | PRN

## 2016-11-20 MED ORDER — ONDANSETRON HCL 4 MG PO TABS: 4.0000 mg | ORAL_TABLET | Freq: Four times a day (QID) | ORAL | Status: DC | PRN

## 2016-11-20 MED ORDER — SODIUM CHLORIDE 0.9 % IV SOLN
INTRAVENOUS | Status: DC
  Administered 2016-11-20 – 2016-11-21 (×4): via INTRAVENOUS
  Administered 2016-11-22: 1000 mL via INTRAVENOUS

## 2016-11-20 MED ORDER — ACETAMINOPHEN 650 MG RE SUPP: 650.0000 mg | Freq: Four times a day (QID) | RECTAL | Status: DC | PRN

## 2016-11-20 MED ORDER — STROKE: EARLY STAGES OF RECOVERY BOOK
Freq: Once | Status: AC
  Administered 2016-11-20: 04:00:00
  Filled 2016-11-20: qty 1

## 2016-11-20 NOTE — Progress Notes (Signed)
*  PRELIMINARY RESULTS* Vascular Ultrasound Bilateral lower extremity venous duplex has been completed.  Preliminary findings: No evidence of deep vein thrombosis or baker's cysts bilaterally.   Everrett Coombe 11/20/2016, 12:02 PM

## 2016-11-20 NOTE — Evaluation (Signed)
Clinical/Bedside Swallow Evaluation Patient Details  Name: Keith [name] MRN: [id number] Date of Birth: 1940/03/27  Today's Date: 11/20/2016 Time: SLP Start Time (ACUTE ONLY): 0931 SLP Stop Time (ACUTE ONLY): 0942 SLP Time Calculation (min) (ACUTE ONLY): 11 min  Past Medical History:  Past Medical History:  Diagnosis Date  . Hypothyroidism   . Obstructive uropathy    Past Surgical History: History reviewed. No pertinent surgical history. HPI:  76 y.o. male with medical history significant of Hypothyroidism. Patient admitted with c/o R sided weakness with speech deficits in the ED. CT head shows very small, trace SAH in Lt frontal region. MRI shows multifocal acute small frontal/L MCA territory infarcts.Imaging also shows a mass on top of skull that is worrisome for malignancy.   Assessment / Plan / Recommendation Clinical Impression  Pt has mild R sided facial weakness, which combined with his broken dentures results in moderately prolonged mastication and oral residue requiring Min cues for liquid washes to clear. Pt also says he has a hiatal hernia and often avoids bread products and tough meats because they feel like they get "stuck". He has no overt s/s of aspiration. Recommend Dys 2 diet and thin liquids. SLP will f/u for tolerance and potential to advance. SLP Visit Diagnosis: Dysphagia, oral phase (R13.11)    Aspiration Risk  Mild aspiration risk    Diet Recommendation Dysphagia 2 (Fine chop);Thin liquid   Liquid Administration via: Cup;Straw Medication Administration: Whole meds with puree Supervision: Patient able to self feed;Intermittent supervision to cue for compensatory strategies Compensations: Slow rate;Small sips/bites;Follow solids with liquid Postural Changes: Seated upright at 90 degrees;Remain upright for at least 30 minutes after po intake    Other  Recommendations Oral Care Recommendations: Oral care BID   Follow up Recommendations Inpatient Rehab       Frequency and Duration min 2x/week  2 weeks       Prognosis Prognosis for Safe Diet Advancement: Good Barriers to Reach Goals: Other (Comment)(ill-fitting dentures, h/o HH)      Swallow Study   General HPI: 76 y.o. male with medical history significant of Hypothyroidism. Patient admitted with c/o R sided weakness with speech deficits in the ED. CT head shows very small, trace SAH in Lt frontal region. MRI shows multifocal acute small frontal/L MCA territory infarcts.Imaging also shows a mass on top of skull that is worrisome for malignancy. Type of Study: Bedside Swallow Evaluation Previous Swallow Assessment: none in chart Diet Prior to this Study: NPO Temperature Spikes Noted: No Respiratory Status: Room air History of Recent Intubation: No Behavior/Cognition: Alert;Cooperative;Pleasant mood Oral Cavity Assessment: Within Functional Limits Oral Care Completed by SLP: No Oral Cavity - Dentition: Dentures, top;Dentures, bottom;Other (Comment)(top and bottom are both broken) Vision: Functional for self-feeding Self-Feeding Abilities: Able to feed self;Needs set up Patient Positioning: Upright in chair Baseline Vocal Quality: Normal Volitional Cough: Strong Volitional Swallow: Able to elicit    Oral/Motor/Sensory Function Overall Oral Motor/Sensory Function: Mild impairment Facial ROM: Reduced right;Suspected CN VII (facial) dysfunction Facial Symmetry: Abnormal symmetry right;Suspected CN VII (facial) dysfunction Facial Strength: Reduced right;Suspected CN VII (facial) dysfunction Lingual ROM: Within Functional Limits Lingual Symmetry: Within Functional Limits Lingual Strength: Within Functional Limits Velum: Within Functional Limits Mandible: Within Functional Limits   Ice Chips Ice chips: Not tested   Thin Liquid Thin Liquid: Within functional limits Presentation: Cup;Self Fed;Straw    Nectar Thick Nectar Thick Liquid: Not tested   Honey Thick Honey Thick Liquid: Not  tested   Puree Puree: Within  functional limits Presentation: Self Fed;Spoon   Solid   GO   Solid: Impaired Presentation: Self Fed Oral Phase Impairments: Impaired mastication Oral Phase Functional Implications: Oral residue        Germain Osgood 11/20/2016,10:19 AM  Germain Osgood, M.A. CCC-SLP [phone]

## 2016-11-20 NOTE — Progress Notes (Signed)
STROKE TEAM PROGRESS NOTE   SUBJECTIVE (INTERVAL HISTORY) His son is at the bedside.  Overall he feels his condition is stable. Still has right facial droop, dysarthria, left sided weakness. MRI did not show SAH but left MCA infarct and left ICA occlusion. Pending CUS and TTE. Passed swallow. Consider STROKE AF trial.    OBJECTIVE Temp:  [97.6 F (36.4 C)-98.9 F (37.2 C)] 97.6 F (36.4 C) (11/05 1200) Pulse Rate:  [57-80] 78 (11/05 1400) Cardiac Rhythm: Normal sinus rhythm (11/05 0800) Resp:  [11-21] 16 (11/05 1400) BP: (123-175)/(52-68) 150/58 (11/05 1400) SpO2:  [92 %-100 %] 97 % (11/05 1400) Weight:  [140 lb (63.5 kg)] 140 lb (63.5 kg) (11/04 1859)  No results for input(s): GLUCAP in the last 168 hours. Recent Labs  Lab 11/19/16 1902  NA 137  K 4.3  CL 107  CO2 22  GLUCOSE 133*  BUN 28*  CREATININE 2.38*  CALCIUM 8.9   Recent Labs  Lab 11/19/16 1902  AST 15  ALT 10*  ALKPHOS 89  BILITOT 0.5  PROT 7.0  ALBUMIN 3.5   Recent Labs  Lab 11/19/16 1902  WBC 7.2  NEUTROABS 4.9  HGB 12.9*  HCT 40.2  MCV 91.6  PLT 239   No results for input(s): CKTOTAL, CKMB, CKMBINDEX, TROPONINI in the last 168 hours. Recent Labs    11/19/16 2124  LABPROT 12.9  INR 0.98   No results for input(s): COLORURINE, LABSPEC, PHURINE, GLUCOSEU, HGBUR, BILIRUBINUR, KETONESUR, PROTEINUR, UROBILINOGEN, NITRITE, LEUKOCYTESUR in the last 72 hours.  Invalid input(s): APPERANCEUR     Component Value Date/Time   CHOL 183 11/20/2016 0245   TRIG 115 11/20/2016 0245   HDL 35 (L) 11/20/2016 0245   CHOLHDL 5.2 11/20/2016 0245   VLDL 23 11/20/2016 0245   LDLCALC 125 (H) 11/20/2016 0245   Lab Results  Component Value Date   HGBA1C 5.5 11/20/2016   No results found for: LABOPIA, COCAINSCRNUR, LABBENZ, AMPHETMU, THCU, LABBARB  No results for input(s): ETH in the last 168 hours.  I have personally reviewed the radiological images below and agree with the radiology interpretations.  Ct  Head Wo Contrast 11/19/2016 IMPRESSION: 1. Trace LEFT frontal acute to subacute subarachnoid hemorrhage. Recommend MRI of the brain on nonemergent basis. 2. Otherwise negative CT HEAD for age. 3. New 2.5 cm soft tissue mass LEFT frontal scalp concerning for skin cancer. Recommend dermatologic consultation on a nonemergent basis.    Mri and Mra Brain Wo Contrast 11/20/2016 IMPRESSION: MRI HEAD: 1. Multifocal acute small frontal/ LEFT MCA territory nonhemorrhagic infarct. 2. 3 x 2.7 x 1.4 cm solid frontal scalp mass consistent with neoplasm, no calvarial invasion. Recommend non emergent dermatologic consultation. 3. No MR correlate for suspected LEFT frontal lobe subarachnoid hemorrhage ; please note MRI is frequently insensitive to hyperacute subarachnoid hemorrhage. 4. Recommend CTA HEAD and neck for further characterization. MRA HEAD: 1. Occluded LEFT internal carotid artery with thready reconstitution LEFT supraclinoid segment. 2. Slow flow LEFT middle cerebral artery, occluded LEFT M2 segment. 3. Amorphous LEFT A1-2 junction, this could reflect small aneurysm, fenestration or turbulent retrograde flow. 4. Moderate stenosis RIGHT supraclinoid internal carotid artery. Suspected severe stenosis RIGHT A1 origin. 5. Recommend CTA HEAD and neck for further characterization.   Carotid Doppler  Right 60-79% mid internal carotid artery stenosis, left internal carotid artery appears occluded. Bilateral vertebral arteries appear patent and antegrade.  2D Echocardiogram  Pending  LE venous doppler  No evidence of deep vein thrombosis or baker's cysts bilaterally.  PHYSICAL EXAM  Temp:  [97.6 F (36.4 C)-98.9 F (37.2 C)] 97.6 F (36.4 C) (11/05 1200) Pulse Rate:  [57-80] 78 (11/05 1400) Resp:  [11-21] 16 (11/05 1400) BP: (123-175)/(52-68) 150/58 (11/05 1400) SpO2:  [92 %-100 %] 97 % (11/05 1400) Weight:  [140 lb (63.5 kg)] 140 lb (63.5 kg) (11/04 1859)  General - Well nourished, well developed, in  no apparent distress.  Ophthalmologic - fundi not visualized due to noncooperation.  Cardiovascular - Regular rate and rhythm with no murmur.  Mental Status -  Level of arousal and orientation to time, place, and person were intact. Language including naming, repetition, comprehension was assessed and found intact, intermittent hesitation of speech but moderate to severe dysarthria Fund of Knowledge was assessed and was intact.  Cranial Nerves II - XII - II - Visual field intact OU. III, IV, VI - Extraocular movements intact. V - Facial sensation intact bilaterally. VII - right facial droop. VIII - Hearing & vestibular intact bilaterally. X - Palate elevates symmetrically. XI - Chin turning & shoulder shrug intact bilaterally. XII - Tongue protrusion intact.  Motor Strength - The patient's strength was normal in LUE and LLE, however, RUE 4-/5 with pronator drift and RLE proximal 5-/5 and distal 4-/5.  Bulk was normal and fasciculations were absent.   Motor Tone - Muscle tone was assessed at the neck and appendages and was normal.  Reflexes - The patient's reflexes were symmetrical in all extremities and he had no pathological reflexes.  Sensory - Light touch, temperature/pinprick were assessed and were symmetrical.    Coordination - The patient had normal movements in the left hand with no ataxia or dysmetria.  Tremor was absent.  Gait and Station - deferred.   ASSESSMENT/PLAN [name] is a 76 y.o. male with no clear history with regular PCP follow up admitted for right side weakness and slurry speech. No tPA given due to out of window and question of SAH.    Stroke:  left MCA small infarct, embolic likely secondary to large vessel disease source of left ICA occlusion  Resultant dysarthria and left hemiparesis  MRI  Left MCA infarct  MRA  Left ICA occlusion  Carotid Doppler  Left ICA occlusion and right ICA 60-79% stenosis  Will repeat CT head in am and decide  on cerebral angio  2D Echo  pending  LE venous doppler - negative  LDL 125  HgbA1c 5.5  SCDs for VTE prophylaxis  DIET DYS 2 Room service appropriate? Yes; Fluid consistency: Thin   No antithrombotic prior to admission, now on No antithrombotic due to [facility]. Will start ASA tomorrow if CT head repeat negative   Patient counseled to be compliant with his antithrombotic medications  Ongoing aggressive stroke risk factor management  Therapy recommendations:  pending  Disposition:  Pending  ? SAH  Initial CT concerns trace SAH left high convexity  MRI did not show SAH  Repeat CT in am  Carotid artery disease  Pt not following with doctors, no previous image to compare  CUS and MRA confirmed left ICA occlusion  CUS right ICA 60-79% stenosis  Likely atherosclerosis etiology, less likely embolic  Will start ASA tomorrow if CT head repeat negative   Hypertension Stable Permissive hypertension (OK if <220/120) for 24-48 hours post stroke and then gradually normalized within 5-7 days.  Long term BP goal 130-150 due to carotid occlusion/stenosis  Hyperlipidemia  Home meds:  none   LDL 125, goal < 70  Now on lipitor 40  Continue statin at discharge  Other Stroke Risk Factors  Advanced age  Other Active Problems  Pt interested in STROKE AF trial.   Not seeing doctors for years  Hospital day # 0  This patient is critically ill due to left MCA infarct, ? SAH, left ICA occlusion and at significant risk of neurological worsening, death form recurrent infarct, hemorrhagic conversion, SAH worsening. This patient's care requires constant monitoring of vital signs, hemodynamics, respiratory and cardiac monitoring, review of multiple databases, neurological assessment, discussion with family, other specialists and medical decision making of high complexity. I spent 40 minutes of neurocritical care time in the care of this patient.  Rosalin Hawking, MD PhD Stroke  Neurology 11/20/2016 2:07 PM    To contact Stroke Continuity provider, please refer to [email or web address] After hours, contact General Neurology

## 2016-11-20 NOTE — Research (Signed)
STROKE-AF research study reviewed with patient and his son. Patient is not sure about participating but will look at consent. I will return later for any questions. Research contact info given to son.

## 2016-11-20 NOTE — Consult Note (Signed)
Physical Medicine and Rehabilitation Consult Reason for Consult: Decreased functional mobility Referring Physician: Dr.Xu   HPI: Keith [name] is a 76 y.o. right handed male with history of hypothyroidism, CKD stage III as well as a 2.5 cm mass at the top of the skull that he has had for a number of years and monitored. Per chart review patient lives alone. Independent working as a Presenter, broadcasting. One level home. He has a son in the area that can arrange assistance as needed. Presented 11/19/2016 with headache and some incoordination of the right side. Patient denied any trauma or fall. Noted creatinine 2.38 from latest baseline of 3.20. TSH 5.170. Troponin negative. CT of the head reviewed, showing left SAH.  Per report, trace left frontal acute to subacute subarachnoid hemorrhage. MRI showed multifocal acute small frontal left MCA territory nonhemorrhagic infarction. 2.3 x 2.7 x 1.4 cm solid frontal scalp mass consistent with neoplasm. MRA showed occluded left internal carotid artery with thready reconstitution left supraclinoid segment. Moderate stenosis right supraclinoid internal carotid artery. Echocardiogram is pending. Neurology consulted conservative care with workup currently ongoing. Await plan for CT perfusion scan. Currently on dysphagia #2 thin liquid diet. Physical therapy evaluation completed with recommendations of physical medicine rehabilitation consult. Seen in conjunction with PT.    Review of Systems  Constitutional: Negative for chills and fever.  HENT: Negative for hearing loss.   Eyes: Negative for blurred vision.  Respiratory: Negative for cough and shortness of breath.   Cardiovascular: Negative for chest pain, palpitations and leg swelling.  Gastrointestinal: Positive for constipation. Negative for nausea and vomiting.  Genitourinary: Positive for urgency. Negative for dysuria, flank pain and hematuria.  Musculoskeletal: Negative for myalgias.  Skin: Negative  for rash.  Neurological: Positive for dizziness, speech change, focal weakness and headaches. Negative for seizures.  All other systems reviewed and are negative.  Past Medical History:  Diagnosis Date  . Hypothyroidism   . Obstructive uropathy    History reviewed. No pertinent surgical history. Family History  Problem Relation Age of Onset  . Lung cancer Father    Social History:  reports that  has never smoked. he has never used smokeless tobacco. He reports that he does not drink alcohol or use drugs. Allergies:  Allergies  Allergen Reactions  . Penicillins Other (See Comments)    unknown   No medications prior to admission.    Home: Home Living Family/patient expects to be discharged to:: Private residence Living Arrangements: Alone Available Help at Discharge: Family, Available 24 hours/day Type of Home: House Home Access: Stairs to enter [employer] of Steps: 1 - at pt's house, level entry at son's [place]: One level Bathroom Shower/Tub: Chiropodist: [place]: None  Functional History: Prior Function Level of Independence: Independent Comments: has worked for [employer] for [employer] Status:  Mobility: [place] bed mobility: Needs Assistance Bed Mobility: Supine to Sit Supine to sit: Min assist, HOB elevated General bed mobility comments: HOb 20 degrees, increased time and min assist to fully elevate trunk and scoot to EOB Transfers Overall transfer level: Needs assistance Transfers: Sit to/from Stand Sit to Stand: Min assist General transfer comment: assist for safety, pt able to stand with right lean in sitting and standing and assist for balance Ambulation/Gait Ambulation/Gait assistance: Mod assist Ambulation Distance (Feet): 40 Feet Assistive device: 1 person hand held assist Gait Pattern/deviations: Step-to pattern, Decreased stance time - right, Decreased dorsiflexion -  right  General Gait Details: pt leaning right with decreased foot clearance and stance on RLE. Pt with Left HHA with cues for posture, position and sequence. Chair to follow and limited by fatigue Gait velocity interpretation: Below normal speed for age/gender    ADL:    Cognition: Cognition Overall Cognitive Status: Within Functional Limits for tasks assessed Orientation Level: Oriented X4 Cognition Arousal/Alertness: Awake/alert Behavior During Therapy: WFL for tasks assessed/performed Overall Cognitive Status: Within Functional Limits for tasks assessed Area of Impairment: Safety/judgement Safety/Judgement: Decreased awareness of deficits General Comments: right inattention and forgets RUE without cues to attend to its position and placement  Blood pressure (!) 154/62, pulse 70, temperature 98.9 F (37.2 C), resp. rate (!) 21, height 5\' 9"  (1.753 m), weight 63.5 kg (140 lb), SpO2 97 %. Physical Exam  Constitutional: He is oriented to person, place, and time. He appears well-developed.  Frail  HENT:  Patient with a cystic mass top of the skull that is nontender. Patient stated it had been there for a number of years  Eyes: EOM are normal. Right eye exhibits no discharge. Left eye exhibits no discharge.  Neck: Normal range of motion. Neck supple. No thyromegaly present.  Cardiovascular: Normal rate, regular rhythm and normal heart sounds.  Respiratory: Effort normal and breath sounds normal. No respiratory distress.  GI: Soft. Bowel sounds are normal. He exhibits no distension.  Musculoskeletal: He exhibits no edema or tenderness.  Neurological: He is alert and oriented to person, place, and time.  He followed full commands. Motor: RUE: 4/5 proximal to distal with ataxia RLE: 3+/5 proximal to distal with ataxia LUE: 4+/5  Proximal to distal LLE: 4-/5 proximal to distal Expressive aphasia  Skin: Skin is warm and dry.  Psychiatric: He has a normal mood and affect. His behavior  is normal. Thought content normal.    Results for orders placed or performed during the hospital encounter of 11/19/16 (from the past 24 hour(s))  CBC with Differential     Status: Abnormal   Collection Time: 11/19/16  7:02 PM  Result Value Ref Range   WBC 7.2 4.0 - 10.5 K/uL   RBC 4.39 4.22 - 5.81 MIL/uL   Hemoglobin 12.9 (L) 13.0 - 17.0 g/dL   HCT 40.2 39.0 - 52.0 %   MCV 91.6 78.0 - 100.0 fL   MCH 29.4 26.0 - 34.0 pg   MCHC 32.1 30.0 - 36.0 g/dL   RDW 13.6 11.5 - 15.5 %   Platelets 239 150 - 400 K/uL   Neutrophils Relative % 68 %   Neutro Abs 4.9 1.7 - 7.7 K/uL   Lymphocytes Relative 22 %   Lymphs Abs 1.6 0.7 - 4.0 K/uL   Monocytes Relative 7 %   Monocytes Absolute 0.5 0.1 - 1.0 K/uL   Eosinophils Relative 3 %   Eosinophils Absolute 0.2 0.0 - 0.7 K/uL   Basophils Relative 0 %   Basophils Absolute 0.0 0.0 - 0.1 K/uL  Comprehensive metabolic panel     Status: Abnormal   Collection Time: 11/19/16  7:02 PM  Result Value Ref Range   Sodium 137 135 - 145 mmol/L   Potassium 4.3 3.5 - 5.1 mmol/L   Chloride 107 101 - 111 mmol/L   CO2 22 22 - 32 mmol/L   Glucose, Bld 133 (H) 65 - 99 mg/dL   BUN 28 (H) 6 - 20 mg/dL   Creatinine, Ser 2.38 (H) 0.61 - 1.24 mg/dL   Calcium 8.9 8.9 - 10.3 mg/dL   Total  Protein 7.0 6.5 - 8.1 g/dL   Albumin 3.5 3.5 - 5.0 g/dL   AST 15 15 - 41 U/L   ALT 10 (L) 17 - 63 U/L   Alkaline Phosphatase 89 38 - 126 U/L   Total Bilirubin 0.5 0.3 - 1.2 mg/dL   GFR calc non Af Amer 25 (L) >60 mL/min   GFR calc Af Amer 29 (L) >60 mL/min   Anion gap 8 5 - 15  Protime-INR     Status: None   Collection Time: 11/19/16  9:24 PM  Result Value Ref Range   Prothrombin Time 12.9 11.4 - 15.2 seconds   INR 0.98   APTT     Status: None   Collection Time: 11/19/16  9:24 PM  Result Value Ref Range   aPTT 31 24 - 36 seconds  I-stat troponin, ED     Status: None   Collection Time: 11/19/16  9:29 PM  Result Value Ref Range   Troponin i, poc 0.00 0.00 - 0.08 ng/mL    Comment 3          TSH     Status: Abnormal   Collection Time: 11/20/16  2:45 AM  Result Value Ref Range   TSH 5.170 (H) 0.350 - 4.500 uIU/mL  Hemoglobin A1c     Status: None   Collection Time: 11/20/16  2:45 AM  Result Value Ref Range   Hgb A1c MFr Bld 5.5 4.8 - 5.6 %   Mean Plasma Glucose 111.15 mg/dL  Lipid panel     Status: Abnormal   Collection Time: 11/20/16  2:45 AM  Result Value Ref Range   Cholesterol 183 0 - 200 mg/dL   Triglycerides 115 <150 mg/dL   HDL 35 (L) >40 mg/dL   Total CHOL/HDL Ratio 5.2 RATIO   VLDL 23 0 - 40 mg/dL   LDL Cholesterol 125 (H) 0 - 99 mg/dL  MRSA PCR Screening     Status: None   Collection Time: 11/20/16  4:21 AM  Result Value Ref Range   MRSA by PCR NEGATIVE NEGATIVE   Ct Head Wo Contrast  Result Date: 11/19/2016 CLINICAL DATA:  Unsteady gait, dizziness and tinnitus beginning this morning. EXAM: CT HEAD WITHOUT CONTRAST TECHNIQUE: Contiguous axial images were obtained from the base of the skull through the vertex without intravenous contrast. COMPARISON:  CT HEAD July 08, 2010 FINDINGS: BRAIN: No intraparenchymal hemorrhage, mass effect nor midline shift. The ventricles and sulci are normal for age. Patchy supratentorial white matter hypodensities less than expected for patient's age, though non-specific are most compatible with chronic small vessel ischemic disease. No acute large vascular territory infarcts. No abnormal extra-axial fluid collections. Basal cisterns are patent. VASCULAR: Moderate calcific atherosclerosis of the carotid siphons. SKULL: No skull fracture. New 2.6 cm soft tissue solid mass LEFT frontal vertex scalp, no underlying osseous abnormality. SINUSES/ORBITS: Paranasal sinus are well aerated. Under pneumatized LEFT mastoid air cells.The included ocular globes and orbital contents are non-suspicious. Similar enophthalmos. OTHER: Patient is edentulous . IMPRESSION: 1. Trace LEFT frontal acute to subacute subarachnoid hemorrhage.  Recommend MRI of the brain on nonemergent basis. 2. Otherwise negative CT HEAD for age. 3. New 2.5 cm soft tissue mass LEFT frontal scalp concerning for skin cancer. Recommend dermatologic consultation on a nonemergent basis. 4. Critical Value/emergent results were called by telephone at the time of interpretation on 11/19/2016 at 11:25 pm to Dr. Dorie Rank , who verbally acknowledged these results. Electronically Signed   By: Thana Farr.D.  On: 11/19/2016 23:27   Mr Jodene Nam Head Wo Contrast  Result Date: 11/20/2016 CLINICAL DATA:  Unsteady gait, dizziness and tinnitus beginning this morning. Suspect stroke. Follow-up potential subarachnoid hemorrhage. EXAM: MRI HEAD WITHOUT CONTRAST MRA HEAD WITHOUT CONTRAST TECHNIQUE: Multiplanar, multiecho pulse sequences of the brain and surrounding structures were obtained without intravenous contrast. Angiographic images of the head were obtained using MRA technique without contrast. COMPARISON:  CT HEAD November 19, 2016 FINDINGS: MRI HEAD FINDINGS BRAIN: Patchy reduced diffusion LEFT insula, LEFT frontal lobe extending to the operculum with low ADC values and faint T2 hyperintense signal. No abnormal intracranial susceptibility artifact. Focal susceptibility artifact and T1 shortening LEFT MCA at the level of the insula. Ventricles and sulci are normal for patient's. No additional levels of with normal signal. No midline shift, mass effect or masses. No abnormal extra-axial fluid collections. VASCULAR: T2 bright signal LEFT internal carotid artery from cervical segment to supraclinoid segment. SKULL AND UPPER CERVICAL SPINE: No abnormal sellar expansion. No suspicious calvarial bone marrow signal. Craniocervical junction maintained. 3 x 2.7 x 1.4 cm (AP by transverse by CC) solid midline frontal scalp mass without calvarial invasion. SINUSES/ORBITS: Chronic LEFT mastoid effusion. Paranasal sinus are well aerated. The included ocular globes and orbital contents are  non-suspicious. OTHER: Patient is edentulous. MRA HEAD FINDINGS ANTERIOR CIRCULATION: Complete loss of flow related enhancement LEFT internal carotid artery from the included cervical segment. Thready reconstitution supraclinoid LEFT internal carotid artery. Normal flow related enhancement of the RIGHT included cervical, petrous, cavernous and supraclinoid internal carotid arteries. Moderate stenosis RIGHT supraclinoid internal carotid artery stenosis. Patent anterior communicating artery, amorphous LEFT A1-2 junction, possible fenestrated versus small aneurysm. Suspected severe stenosis RIGHT A1 origin. Patent presumable retrograde flow LEFT A1 segment. Thready flow related enhancement LEFT middle cerebral artery, occluded LEFT M2 segment. Normal flow related enhancement RIGHT middle cerebral artery. POSTERIOR CIRCULATION: RIGHT vertebral artery is dominant. Basilar artery is patent, with normal flow related enhancement of the main branch vessels. Fenestrated vertebrobasilar junction. Patent posterior cerebral arteries. Posterior communicating artery's not identified. No large vessel occlusion, flow limiting stenosis,  aneurysm. ANATOMIC VARIANTS: None. Source images and MIP images were reviewed. IMPRESSION: MRI HEAD: 1. Multifocal acute small frontal/ LEFT MCA territory nonhemorrhagic infarct. 2. 3 x 2.7 x 1.4 cm solid frontal scalp mass consistent with neoplasm, no calvarial invasion. Recommend non emergent dermatologic consultation. 3. No MR correlate for suspected LEFT frontal lobe subarachnoid hemorrhage ; please note MRI is frequently insensitive to hyperacute subarachnoid hemorrhage. 4. Recommend CTA HEAD and neck for further characterization. MRA HEAD: 1. Occluded LEFT internal carotid artery with thready reconstitution LEFT supraclinoid segment. 2. Slow flow LEFT middle cerebral artery, occluded LEFT M2 segment. 3. Amorphous LEFT A1-2 junction, this could reflect small aneurysm, fenestration or turbulent  retrograde flow. 4. Moderate stenosis RIGHT supraclinoid internal carotid artery. Suspected severe stenosis RIGHT A1 origin. 5. Recommend CTA HEAD and neck for further characterization. Acute findings discussed with and reconfirmed by Dr.Kirkpatrick, Neurology on 11/20/2016 at 2:15 am. Electronically Signed   By: Elon Alas M.D.   On: 11/20/2016 02:45   Mr Brain Wo Contrast  Result Date: 11/20/2016 CLINICAL DATA:  Unsteady gait, dizziness and tinnitus beginning this morning. Suspect stroke. Follow-up potential subarachnoid hemorrhage. EXAM: MRI HEAD WITHOUT CONTRAST MRA HEAD WITHOUT CONTRAST TECHNIQUE: Multiplanar, multiecho pulse sequences of the brain and surrounding structures were obtained without intravenous contrast. Angiographic images of the head were obtained using MRA technique without contrast. COMPARISON:  CT HEAD November 19, 2016 FINDINGS:  MRI HEAD FINDINGS BRAIN: Patchy reduced diffusion LEFT insula, LEFT frontal lobe extending to the operculum with low ADC values and faint T2 hyperintense signal. No abnormal intracranial susceptibility artifact. Focal susceptibility artifact and T1 shortening LEFT MCA at the level of the insula. Ventricles and sulci are normal for patient's. No additional levels of with normal signal. No midline shift, mass effect or masses. No abnormal extra-axial fluid collections. VASCULAR: T2 bright signal LEFT internal carotid artery from cervical segment to supraclinoid segment. SKULL AND UPPER CERVICAL SPINE: No abnormal sellar expansion. No suspicious calvarial bone marrow signal. Craniocervical junction maintained. 3 x 2.7 x 1.4 cm (AP by transverse by CC) solid midline frontal scalp mass without calvarial invasion. SINUSES/ORBITS: Chronic LEFT mastoid effusion. Paranasal sinus are well aerated. The included ocular globes and orbital contents are non-suspicious. OTHER: Patient is edentulous. MRA HEAD FINDINGS ANTERIOR CIRCULATION: Complete loss of flow related  enhancement LEFT internal carotid artery from the included cervical segment. Thready reconstitution supraclinoid LEFT internal carotid artery. Normal flow related enhancement of the RIGHT included cervical, petrous, cavernous and supraclinoid internal carotid arteries. Moderate stenosis RIGHT supraclinoid internal carotid artery stenosis. Patent anterior communicating artery, amorphous LEFT A1-2 junction, possible fenestrated versus small aneurysm. Suspected severe stenosis RIGHT A1 origin. Patent presumable retrograde flow LEFT A1 segment. Thready flow related enhancement LEFT middle cerebral artery, occluded LEFT M2 segment. Normal flow related enhancement RIGHT middle cerebral artery. POSTERIOR CIRCULATION: RIGHT vertebral artery is dominant. Basilar artery is patent, with normal flow related enhancement of the main branch vessels. Fenestrated vertebrobasilar junction. Patent posterior cerebral arteries. Posterior communicating artery's not identified. No large vessel occlusion, flow limiting stenosis,  aneurysm. ANATOMIC VARIANTS: None. Source images and MIP images were reviewed. IMPRESSION: MRI HEAD: 1. Multifocal acute small frontal/ LEFT MCA territory nonhemorrhagic infarct. 2. 3 x 2.7 x 1.4 cm solid frontal scalp mass consistent with neoplasm, no calvarial invasion. Recommend non emergent dermatologic consultation. 3. No MR correlate for suspected LEFT frontal lobe subarachnoid hemorrhage ; please note MRI is frequently insensitive to hyperacute subarachnoid hemorrhage. 4. Recommend CTA HEAD and neck for further characterization. MRA HEAD: 1. Occluded LEFT internal carotid artery with thready reconstitution LEFT supraclinoid segment. 2. Slow flow LEFT middle cerebral artery, occluded LEFT M2 segment. 3. Amorphous LEFT A1-2 junction, this could reflect small aneurysm, fenestration or turbulent retrograde flow. 4. Moderate stenosis RIGHT supraclinoid internal carotid artery. Suspected severe stenosis RIGHT A1  origin. 5. Recommend CTA HEAD and neck for further characterization. Acute findings discussed with and reconfirmed by Dr.Kirkpatrick, Neurology on 11/20/2016 at 2:15 am. Electronically Signed   By: Elon Alas M.D.   On: 11/20/2016 02:45    Assessment/Plan: Diagnosis: Multifocal stroke Labs and images independently reviewed.  Records reviewed and summated above. Stroke: Continue secondary stroke prophylaxis and Risk Factor Modification listed below:   Blood Pressure Management:  Continue current medication with prn's with permisive HTN per primary team Right sided hemiparesis: fit for orthosis to prevent contractures Motor recovery: Fluoxetine  1. Does the need for close, 24 hr/day medical supervision in concert with the patient's rehab needs make it unreasonable for this patient to be served in a less intensive setting? Yes  2. Co-Morbidities requiring supervision/potential complications: dysphagia post-stroke (advance diet as tolerated), hypothyroidism (cont meds, ensure appropriate mood and energy level for therapies), CKD stage III (avoid nephrotoxic meds), 2.5 cm mass at the top of the skull consistent with neoplasm, stable, hyperglycemia (Monitor in accordance with exercise and adjust meds as necessary), HLD (meds), ABLA (transfuse  if necessary to ensure appropriate perfusion for increased activity tolerance) 3. Due to safety, disease management, medication administration and patient education, does the patient require 24 hr/day rehab nursing? Yes 4. Does the patient require coordinated care of a physician, rehab nurse, PT (1-2 hrs/day, 5 days/week), OT (1-2 hrs/day, 5 days/week) and SLP (1-2 hrs/day, 5 days/week) to address physical and functional deficits in the context of the above medical diagnosis(es)? Yes Addressing deficits in the following areas: balance, endurance, locomotion, strength, transferring, bathing, dressing, toileting, speech, swallowing and psychosocial support 5. Can  the patient actively participate in an intensive therapy program of at least 3 hrs of therapy per day at least 5 days per week? Potentially 6. The potential for patient to make measurable gains while on inpatient rehab is excellent 7. Anticipated functional outcomes upon discharge from inpatient rehab are min assist  with PT, min assist with OT, modified independent with SLP. 8. Estimated rehab length of stay to reach the above functional goals is: 16-19 days. 9. Anticipated D/C setting: Home 10. Anticipated post D/C treatments: HH therapy and Home excercise program 11. Overall Rehab/Functional Prognosis: good  RECOMMENDATIONS: This patient's condition is appropriate for continued rehabilitative care in the following setting: CIR after completion of medical workup. Patient has agreed to participate in recommended program. Yes Note that insurance prior authorization may be required for reimbursement for recommended care.  Comment: Rehab Admissions Coordinator to follow up.  Delice Lesch, MD, ABPMR Lauraine Rinne J., PA-C 11/20/2016

## 2016-11-20 NOTE — ED Notes (Signed)
Admitting doctor at the bedside 

## 2016-11-20 NOTE — Progress Notes (Signed)
*  PRELIMINARY RESULTS* Vascular Ultrasound Carotid Duplex (Doppler) has been completed.  Preliminary findings: Right 60-79% mid internal carotid artery stenosis, left internal carotid artery appears occluded. Bilateral vertebral arteries appear patent and antegrade.  Everrett Coombe 11/20/2016, 12:11 PM

## 2016-11-20 NOTE — ED Notes (Signed)
Delay in lab draw,  Pt in [facility]

## 2016-11-20 NOTE — ED Notes (Signed)
Patient transported to MRI 

## 2016-11-20 NOTE — Evaluation (Signed)
Physical Therapy Evaluation Patient Details Name: Keith [name] MRN: [id number] DOB: 10-29-1940 Today's Date: 11/20/2016   History of Present Illness  76 y.o. male with medical history significant of Hypothyroidism.  Patient admitted with c/o R sided weakness with speech deficits in the ED. CT head shows very small, trace SAH in Lt frontal region , Lt MCA CVA.  Also shows a mass on top of skull that is worrisome for malignancy.  Clinical Impression  Pt very pleasant and very willing to mobilize. Pt works as Human resources officer at [employer], lives alone, has very supportive son and 24hr assist as needed. Pt with right hemiparesis, impaired speech, impaired balance, transfers, functional mobility and gait who will benefit from acute therapy to maximize mobility, function, balance, strength and safety to decrease burden of care and maximize independence. Pt educated for RUE and RLE general exercises for strengthening, decreased attention particularly to RUE and need for assessment with positioning and transfers, and encouraged to be OOB for meal time.  BP supine 145/58 Sitting 137/67    Follow Up Recommendations CIR    Equipment Recommendations  Other (comment)(hemiwalker)    Recommendations for Other Services Rehab consult;Speech consult;OT consult     Precautions / Restrictions Precautions Precautions: Fall Restrictions Weight Bearing Restrictions: No      Mobility  Bed Mobility Overal bed mobility: Needs Assistance Bed Mobility: Supine to Sit     Supine to sit: Min assist;HOB elevated     General bed mobility comments: HOb 20 degrees, increased time and min assist to fully elevate trunk and scoot to EOB  Transfers Overall transfer level: Needs assistance   Transfers: Sit to/from Stand Sit to Stand: Min assist         General transfer comment: assist for safety, pt able to stand with right lean in sitting and standing and assist for  balance  Ambulation/Gait Ambulation/Gait assistance: Mod assist Ambulation Distance (Feet): 40 Feet Assistive device: 1 person hand held assist Gait Pattern/deviations: Step-to pattern;Decreased stance time - right;Decreased dorsiflexion - right   Gait velocity interpretation: Below normal speed for age/gender General Gait Details: pt leaning right with decreased foot clearance and stance on RLE. Pt with Left HHA with cues for posture, position and sequence. Chair to follow and limited by fatigue  Stairs            Wheelchair Mobility    Modified Rankin (Stroke Patients Only) Modified Rankin (Stroke Patients Only) Pre-Morbid Rankin Score: No symptoms Modified Rankin: Moderately severe disability     Balance Overall balance assessment: Needs assistance   Sitting balance-[name] Scale: Poor Sitting balance - Comments: pt able to sit 4 min EOB with minguard and max cues for posture and position Postural control: Right lateral lean   Standing balance-[name] Scale: Poor                               Pertinent Vitals/Pain Pain Assessment: No/denies pain    Home Living Family/patient expects to be discharged to:: Private residence Living Arrangements: Alone Available Help at Discharge: Family;Available 24 hours/day Type of Home: House Home Access: Stairs to enter   [employer] of Steps: 1 - at pt's house, level entry at son's Home Layout: One level Home Equipment: None      Prior Function Level of Independence: Independent         Comments: has worked for [employer] for [employer]  Extremity/Trunk Assessment   Upper Extremity Assessment Upper Extremity Assessment: Defer to OT evaluation    Lower Extremity Assessment Lower Extremity Assessment: RLE deficits/detail RLE Deficits / Details: 3/5     Cervical / Trunk Assessment Cervical / Trunk Assessment: Kyphotic  Communication   Communication:  Expressive difficulties  Cognition Arousal/Alertness: Awake/alert Behavior During Therapy: WFL for tasks assessed/performed Overall Cognitive Status: Impaired/Different from baseline Area of Impairment: Safety/judgement                         Safety/Judgement: Decreased awareness of deficits     General Comments: right inattention and forgets RUE without cues to attend to its position and placement      General Comments      Exercises     Assessment/Plan    PT Assessment Patient needs continued PT services  PT Problem List Decreased strength;Decreased mobility;Decreased safety awareness;Decreased coordination;Decreased activity tolerance;Decreased balance;Decreased cognition;Decreased knowledge of use of DME       PT Treatment Interventions DME instruction;Therapeutic activities;Gait training;Therapeutic exercise;Patient/family education;Stair training;Balance training;Functional mobility training;Neuromuscular re-education    PT Goals (Current goals can be found in the Care Plan section)  Acute Rehab PT Goals Patient Stated Goal: return to work PT Goal Formulation: With patient/family Time For Goal Achievement: 12/04/16 Potential to Achieve Goals: Good    Frequency Min 4X/week   Barriers to discharge        Co-evaluation               AM-PAC PT "6 Clicks" Daily Activity  Outcome Measure Difficulty turning over in bed (including adjusting bedclothes, sheets and blankets)?: A Lot Difficulty moving from lying on back to sitting on the side of the bed? : Unable Difficulty sitting down on and standing up from a chair with arms (e.g., wheelchair, bedside commode, etc,.)?: A Lot Help needed moving to and from a bed to chair (including a wheelchair)?: A Julson Help needed walking in hospital room?: A Lot Help needed climbing 3-5 steps with a railing? : A Lot 6 Click Score: 12    End of Session Equipment Utilized During Treatment: Gait belt Activity  Tolerance: Patient tolerated treatment well Patient left: in chair;with family/visitor present;with call bell/phone within reach Nurse Communication: Mobility status;Precautions PT Visit Diagnosis: Unsteadiness on feet (R26.81);Other abnormalities of gait and mobility (R26.89);Hemiplegia and hemiparesis Hemiplegia - Right/Left: Right Hemiplegia - dominant/non-dominant: Dominant Hemiplegia - caused by: Nontraumatic SAH    Time: 4854-6270 PT Time Calculation (min) (ACUTE ONLY): 20 min   Charges:   PT Evaluation $PT Eval Moderate Complexity: 1 Mod     PT G Codes:        Elwyn Reach, PT [phone]   [place] B Stephenia Vogan 11/20/2016, 9:02 AM

## 2016-11-20 NOTE — Progress Notes (Signed)
Inpatient Rehabilitation  Per PT request, patient was screened by Gunnar Fusi for appropriateness for an Inpatient Acute Rehab consult.  At this time we are recommending an Inpatient Rehab consult.  MD please order if you are agreeable.    Carmelia Roller., CCC/SLP Admission Coordinator  [place]  Cell [phone]

## 2016-11-20 NOTE — Consult Note (Addendum)
Neurology Consultation Reason for Consult: Right-sided weakness Referring Physician: Ermalinda Memos  CC: Right-sided weakness  History is obtained from: Patient, family  HPI: Keith [name] is a 76 y.o. male who was normal when he went to bed last night (actually wee hours in the morning.)  Since that time, he woke up and noticed that he was having problems with his right side.  This is been problematic throughout the day, but has been getting gradually worse.  Came in for evaluation of this, and was found to have trace subarachnoid over the left hemisphere.  He denies headache, denies fevers, denies antecedent symptoms.   LKW: 11/5 4 AM tpa given?: no, outside of window,ICH    ROS: A 14 point ROS was performed and is negative except as noted in the HPI.   History reviewed. No pertinent past medical history.   No family history on file.  Social History:  reports that  has never smoked. he has never used smokeless tobacco. He reports that he does not drink alcohol or use drugs.   Exam: Current vital signs: BP (!) 130/55   Pulse 66   Temp 98.6 F (37 C) (Oral)   Resp 16   Ht 5\' 9"  (1.753 m)   Wt 63.5 kg (140 lb)   SpO2 97%   BMI 20.67 kg/m  Vital signs in last 24 hours: Temp:  [98.6 F (37 C)] 98.6 F (37 C) (11/04 1859) Pulse Rate:  [66-68] 66 (11/04 2300) Resp:  [16] 16 (11/04 1859) BP: (130-175)/(55-56) 130/55 (11/04 2300) SpO2:  [97 %-100 %] 97 % (11/04 2300) Weight:  [63.5 kg (140 lb)] 63.5 kg (140 lb) (11/04 1859)   Physical Exam  Constitutional: Appears well-developed and well-nourished.  Psych: Affect appropriate to situation Eyes: No scleral injection HENT: No OP obstrucion Head: Normocephalic.  Cardiovascular: Normal rate and regular rhythm.  Respiratory: Effort normal and breath sounds normal to anterior ascultation GI: Soft.  No distension. There is no tenderness.  Skin: WDI  Neuro: Mental Status: Patient is awake, alert, oriented to person, place,  month, year, and situation. Patient is able to give a clear and coherent history. No signs of aphasia or neglect Cranial Nerves: II: Visual Fields are full. Pupils are equal, round, and reactive to light.   III,IV, VI: He has a right exotropia (baseline) V: Facial sensation is symmetric to temperature VII: Facial movement is weak on the right VIII: hearing is intact to voice X: Uvula elevates symmetrically XI: Shoulder shrug is symmetric. XII: tongue is midline without atrophy or fasciculations.  Motor: Tone is normal. Bulk is normal. 5/5 strength was present on the  Left side, he has a 4/5 right arm and 4+/5 right leg weakness  sensory: Sensation is symmetric to light touch and temperature in the arms and legs. Cerebellar: No ataxia on finger-nose-finger on the left, difficult to perform on the right due to weakness  I have reviewed labs in epic and the results pertinent to this consultation are: Borderline renal insufficiency  I have reviewed the images obtained: CT head-trace subarachnoid on the left  Impression: 76 year old male with right-sided weakness in the setting of trace hyperdense material and sulci on the left.  I am not certain of the etiology of this material, whether it represents subarachnoid hemorrhage versus other highly proteinaceous material (? Malignancy).  This does not appear aneurysmal in nature.  It could be that he fell and hit his head over the past week, but was relatively unremarkable just  enough to cause minor subarachnoid.  Subarachnoid blood could be irritating the underlying cortex resulting in his current deficit.  This does not appear to be emergent or aneurysmal type of subarachnoid blood.  Recommendations: 1) MRI brain with/without contrast, MRA head 2) avoid anticoagulants 3) neurology will follow  Roland Rack, MD [facility] [phone]  If 7pm- 7am, please page neurology on call as listed in [place].

## 2016-11-20 NOTE — Progress Notes (Addendum)
Update: MRI / MRA official read pending, but definitely looks like an acute L MCA territory infarct.  Also appears to have occlusion of L ICA.  1) Stroke pathway with 2d echo, carotid dopplers ordered. 2) neurology calling neuro interventional, but right now favoring not intervening as patient does have at least small SAH and intervention would require ASA / plavix. 3) PT/OT/SLP 4) Im not putting on ASA or plavix yet due to acute blood in brain.  Defer decision on when / if to start anything to neurology. 5) since BP running on the "low side" (for an acute stroke) Dr. Leonel Ramsay wants me to give him fluids.

## 2016-11-20 NOTE — Evaluation (Signed)
Occupational Therapy Evaluation Patient Details Name: Keith [name] MRN: [id number] DOB: 30-Dec-1940 Today's Date: 11/20/2016    History of Present Illness 76 y.o. male with medical history significant of Hypothyroidism.  Patient admitted with c/o R sided weakness with speech deficits in the ED. CT head shows very small, trace SAH in Lt frontal region , Lt MCA CVA.  Also shows a mass on top of skull that is worrisome for malignancy.   Clinical Impression   PTA, pt was living alone and was independent with ADLs, IADLs, driving, and working. Pt currently requiring Mod A for UB ADLs and Max A for LB ADLs. Pt presents with decreased cognition, balance, and functional use of R (dominant) hand. Pt is highly motivated to participate in therapy and return to PLOF. Pt with very supportive family who report they are available upon return home. Pt will require continued acute OT to increase occupational performance and facilitate safe dc. Recommend dc to CIR to intensive OT to optimize safety and independence with ADLs and functional mobility.     Follow Up Recommendations  CIR    Equipment Recommendations  Other (comment)(Defer to next venue)    Recommendations for Other Services Rehab consult;PT consult;Speech consult     Precautions / Restrictions Precautions Precautions: Fall Restrictions Weight Bearing Restrictions: No      Mobility Bed Mobility Overal bed mobility: Needs Assistance Bed Mobility: Supine to Sit     Supine to sit: HOB elevated;Min guard     General bed mobility comments: Min Guard A for safety and VCs to sequence task safety  Transfers Overall transfer level: Needs assistance   Transfers: Sit to/from Stand Sit to Stand: Min assist;+2 safety/equipment         General transfer comment: Pt requiring Min A to power into standing and then stabilize. +2 for safety and balance    Balance Overall balance assessment: Needs assistance Sitting-balance support: No  upper extremity supported;Feet supported Sitting balance-[name] Scale: Poor Sitting balance - Comments: pt able to sit 4 min EOB with minguard and max cues for posture and position Postural control: Right lateral lean;Posterior lean Standing balance support: Single extremity supported;During functional activity Standing balance-[name] Scale: Poor                             ADL either performed or assessed with clinical judgement   ADL Overall ADL's : Needs assistance/impaired Eating/Feeding: Minimal assistance;Sitting Eating/Feeding Details (indicate cue type and reason): supported sitting Grooming: Minimal assistance;Sitting Grooming Details (indicate cue type and reason): pt requiring Min A to correct postural control and balance. Pt requiring Min A for bilateral tasks and needs increased assistance to incorporate RUE Upper Body Bathing: Moderate assistance;Sitting   Lower Body Bathing: Maximal assistance;+2 for physical assistance;Sit to/from stand   Upper Body Dressing : Moderate assistance;Sitting   Lower Body Dressing: Maximal assistance;+2 for physical assistance;Sit to/from stand Lower Body Dressing Details (indicate cue type and reason): Pt requiring physical A to maintain sitting balance while adjusting socks.  Toilet Transfer: Maximal assistance;+2 for physical assistance;Ambulation(Simulated in room)           Functional mobility during ADLs: Maximal assistance;+2 for physical assistance;Cueing for sequencing;Cueing for safety General ADL Comments: Pt demonstrating decreased functional performance. Presenting with decreased functional use of R (dominant) hand, inattention to his R side, decreased cognition, and poor balance     Vision Patient Visual Report: No change from baseline Vision Assessment?: Vision impaired- to  be further tested in functional context     Perception     Praxis      Pertinent Vitals/Pain Pain Assessment: No/denies pain      Hand Dominance Right   Extremity/Trunk Assessment Upper Extremity Assessment Upper Extremity Assessment: RUE deficits/detail RUE Deficits / Details: Pt with decreased strength and coorindation of RUE. Pt with poor opposition, grasp strength, and proproception. Pt able to bring RUE to mouth and top of head with Max cues.  RUE Sensation: decreased light touch RUE Coordination: decreased fine motor;decreased gross motor   Lower Extremity Assessment Lower Extremity Assessment: Defer to PT evaluation   Cervical / Trunk Assessment Cervical / Trunk Assessment: Kyphotic   Communication Communication Communication: Expressive difficulties   Cognition Arousal/Alertness: Awake/alert Behavior During Therapy: WFL for tasks assessed/performed Overall Cognitive Status: Impaired/Different from baseline Area of Impairment: Safety/judgement;Awareness;Following commands;Problem solving                       Following Commands: Follows one step commands with increased time Safety/Judgement: Decreased awareness of deficits Awareness: Emergent Problem Solving: Slow processing;Difficulty sequencing;Requires verbal cues;Requires tactile cues General Comments: Pt requiring increased time nad cues to follow commands. Pt demonstrating right inattention and forgets RUE without cues to attend to its position and placement   General Comments  Son present at begining of session    Exercises Exercises: Other exercises Other Exercises Other Exercises: composite flexion and extension and digits; 5 reps; seated   Shoulder Instructions      Home Living Family/patient expects to be discharged to:: Private residence Living Arrangements: Alone Available Help at Discharge: Family;Available 24 hours/day Type of Home: House Home Access: Stairs to enter [employer] of Steps: 1 - at pt's house, level entry at son's   Home Layout: One level     Bathroom Shower/Tub: Animal nutritionist: Standard     Home Equipment: None          Prior Functioning/Environment Level of Independence: Independent        Comments: Independent with ADLs, IADLs, driving, and has worked for [employer] for [employer] List: Decreased range of motion;Decreased activity tolerance;Decreased strength;Impaired balance (sitting and/or standing);Decreased coordination;Decreased cognition;Decreased safety awareness;Decreased knowledge of use of DME or AE;Decreased knowledge of precautions;Impaired UE functional use;Pain      OT Treatment/Interventions: Self-care/ADL training;Therapeutic exercise;Energy conservation;DME and/or AE instruction;Therapeutic activities;Patient/family education    OT Goals(Current goals can be found in the care plan section) Acute Rehab OT Goals Patient Stated Goal: return to work OT Goal Formulation: With patient/family Time For Goal Achievement: 12/04/16 Potential to Achieve Goals: Good ADL Goals Pt Will Perform Grooming: with min guard assist;standing(incorporating RUE 50% of time) Pt Will Perform Upper Body Dressing: with min guard assist;sitting Pt Will Perform Lower Body Dressing: with min guard assist;sit to/from stand Pt Will Transfer to Toilet: with min guard assist;ambulating;bedside commode Pt Will Perform Toileting - Clothing Manipulation and hygiene: with min guard assist;sit to/from stand Additional ADL Goal #1: Pt will incorprate RUE into ADLs 50% of time with 2-3 VCs  OT Frequency: Min 3X/week   Barriers to D/C:            Co-evaluation              AM-PAC PT "6 Clicks" Daily Activity     Outcome Measure Help from another person eating meals?: A [place] Help from another person taking care of personal  grooming?: A [place] Help from another person toileting, which includes using toliet, bedpan, or urinal?: A Lot Help from another person bathing (including washing, rinsing, drying)?: A Lot Help from  another person to put on and taking off regular upper body clothing?: A Lot Help from another person to put on and taking off regular lower body clothing?: A Lot 6 Click Score: 14   End of Session Equipment Utilized During Treatment: Gait belt Nurse Communication: Mobility status  Activity Tolerance: Patient tolerated treatment well Patient left: in bed;with call bell/phone within reach;with bed alarm set;with nursing/sitter in room  OT Visit Diagnosis: Unsteadiness on feet (R26.81);Other abnormalities of gait and mobility (R26.89);Other symptoms and signs involving cognitive function;Muscle weakness (generalized) (M62.81);Hemiplegia and hemiparesis Hemiplegia - Right/Left: Right Hemiplegia - dominant/non-dominant: Dominant Hemiplegia - caused by: Cerebral infarction                Time: 0931-1216 OT Time Calculation (min): 20 min Charges:  OT General Charges $OT Visit: 1 Visit OT Evaluation $OT Eval Moderate Complexity: 1 Mod G-Codes:     [place] MSOT, OTR/L Acute Rehab Pager: [phone] Office: [place] 11/20/2016, 1:38 PM

## 2016-11-20 NOTE — Evaluation (Signed)
Speech Language Pathology Evaluation Patient Details Name: Keith [name] MRN: [id number] DOB: [birth year] Today's Date: 11/20/2016 Time: 6767-2094 SLP Time Calculation (min) (ACUTE ONLY): 20 min  Problem List:  Patient Active Problem List   Diagnosis Date Noted  . SAH (subarachnoid hemorrhage) ([place]) 11/20/2016  . Hypothyroidism 11/20/2016  . Neoplasm of skin of scalp 11/20/2016  . CVA (cerebral vascular accident) ([place]) 11/20/2016   Past Medical History:  Past Medical History:  Diagnosis Date  . Hypothyroidism   . Obstructive uropathy    Past Surgical History: History reviewed. No pertinent surgical history. HPI:  77 y.o. male with medical history significant of Hypothyroidism. Patient admitted with c/o R sided weakness with speech deficits in the ED. CT head shows very small, trace SAH in Lt frontal region. MRI shows multifocal acute small frontal/L MCA territory infarcts.Imaging also shows a mass on top of skull that is worrisome for malignancy.   Assessment / Plan / Recommendation Clinical Impression  Pt has expressive > receptive communication difficulties, although question a motor planning component. Pt has intact confrontational naming and describes his symptoms as knowing what he wants to say, but not being able to "get it out." With additional time, pt can express himself up to the sentence level in spontaneous communication, but he has significant difficulty with more complex tasks like divergent naming. SLP provided Mod cues for pt to generate <5 words in one minute. Pt would benefit from intensive SLP f/u to maximize functional communication.    SLP Assessment  SLP Recommendation/Assessment: Patient needs continued Speech Lanaguage Pathology Services SLP Visit Diagnosis: Cognitive communication deficit (R41.841)    Follow Up Recommendations  Inpatient Rehab    Frequency and Duration min 2x/week  2 weeks      SLP Evaluation Cognition  Overall Cognitive Status:  Within Functional Limits for tasks assessed Orientation Level: Oriented X4       Comprehension  Auditory Comprehension Overall Auditory Comprehension: Impaired Yes/No Questions: Impaired Basic Biographical Questions: 76-100% accurate Basic Immediate Environment Questions: 75-100% accurate Complex Questions: 75-100% accurate Commands: Impaired(simple tasks) One Step Basic Commands: 75-100% accurate Interfering Components: Motor planning    Expression Expression Primary Mode of Expression: Verbal Verbal Expression Overall Verbal Expression: Impaired Initiation: No impairment Automatic Speech: Name Level of Generative/Spontaneous Verbalization: Sentence Naming: Impairment Confrontation: Within functional limits Divergent: 0-24% accurate Verbal Errors: Aware of errors Pragmatics: No impairment Effective Techniques: Other (Comment)(additional time) Non-Verbal Means of Communication: Other (comment)(pt has been writing to staff) Written Expression Dominant Hand: Right   Oral / Motor  Oral Motor/Sensory Function Overall Oral Motor/Sensory Function: Mild impairment Facial ROM: Reduced right;Suspected CN VII (facial) dysfunction Facial Symmetry: Abnormal symmetry right;Suspected CN VII (facial) dysfunction Facial Strength: Reduced right;Suspected CN VII (facial) dysfunction Lingual ROM: Within Functional Limits Lingual Symmetry: Within Functional Limits Lingual Strength: Within Functional Limits Velum: Within Functional Limits Mandible: Within Functional Limits Motor Speech Overall Motor Speech: Impaired Respiration: Within functional limits Phonation: Normal Resonance: Within functional limits Articulation: Impaired Level of Impairment: Sentence Intelligibility: Intelligibility reduced Sentence: 75-100% accurate Motor Planning: Impaired Level of Impairment: Word Motor Speech Errors: Aware;Inconsistent Effective Techniques: Slow rate   GO                     Germain Osgood 11/20/2016, 10:29 AM  Germain Osgood, M.A. CCC-SLP [phone]

## 2016-11-20 NOTE — Progress Notes (Addendum)
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 His exam is currently stable with an NIH of 5. He has distal > proximal weakness of the right arm and 4+/5 weakness of the right leg, no sensory deficit.   I think the MRI findings would explain these symptoms.   With his mild symptoms and small SAH, I am hesitant to rush to IR intervention. Reperfusion and dual antiplatelet therapy I think would have more risk than typical.   If he does worsen, then I think CT perfusion could be performed with consideration of intervention at that time.   I will admit him to the neuro-ICU for closer monitoring. NS 1L bolus followed by 100cc/hr.   Roland Rack, MD [facility] [phone]  If 7pm- 7am, please page neurology on call as listed in [place].

## 2016-11-20 NOTE — Progress Notes (Signed)
Inpatient Rehabilitation  Met with patient and granddaughter at bedside to discuss team's recommendation for IP Rehab.  Shared booklets, insurance verification letter, and answered initial questions.  Patient reports being interested in the program. Plan to follow along for timing of medical readiness and IP Rehab bed availability.  Call if questions.   Carmelia Roller., CCC/SLP Admission Coordinator  [place]  Cell [phone]

## 2016-11-20 NOTE — H&P (Addendum)
History and Physical    Keith [name] BZJ:[id number] DOB: Oct 29, 1940 DOA: 11/19/2016  PCP: Patient, No Pcp Per  Patient coming from: Home  I have personally briefly reviewed patient's old medical records in [place]  Chief Complaint: R sided weakness  HPI: Keith [name] is a 76 y.o. male with medical history significant of Hypothyroidism.  Patient presents to the ED with c/o R sided weakness that he woke up with this morning.  Problematic throughout the day, gradually worsening.  Came in for evaluation.   ED Course: CT head shows very small, trace SAH in L frontal region.  Also shows a mass on top of skull that is worrisome for malignancy.   Review of Systems: As per HPI otherwise 10 point review of systems negative.   Past Medical History:  Diagnosis Date  . Hypothyroidism   . Obstructive uropathy     History reviewed. No pertinent surgical history.   reports that  has never smoked. he has never used smokeless tobacco. He reports that he does not drink alcohol or use drugs.  Allergies  Allergen Reactions  . Penicillins Other (See Comments)    unknown    Family History  Problem Relation Age of Onset  . Lung cancer Father      Prior to Admission medications   Not on File    Physical Exam: Vitals:   11/19/16 1859 11/19/16 2300 11/20/16 0000  BP: (!) 175/56 (!) 130/55 (!) 145/68  Pulse: 68 66 62  Resp: 16    Temp: 98.6 F (37 C)    TempSrc: Oral    SpO2: 100% 97% 96%  Weight: 63.5 kg (140 lb)    Height: 5\' 9"  (1.753 m)      Constitutional: NAD, calm, comfortable Eyes: PERRL, lids and conjunctivae normal ENMT: Mucous membranes are moist. Posterior pharynx clear of any exudate or lesions.Normal dentition.  Neck: normal, supple, no masses, no thyromegaly Respiratory: clear to auscultation bilaterally, no wheezing, no crackles. Normal respiratory effort. No accessory muscle use.  Cardiovascular: Regular rate and rhythm, no murmurs / rubs /  gallops. No extremity edema. 2+ pedal pulses. No carotid bruits.  Abdomen: no tenderness, no masses palpated. No hepatosplenomegaly. Bowel sounds positive.  Musculoskeletal: no clubbing / cyanosis. No joint deformity upper and lower extremities. Good ROM, no contractures. Normal muscle tone.  Skin: 2.5cm mass on top of skull that looks very suspicious for skin malignant neoplasm. Neurologic: CN 2-12 grossly intact. Sensation intact, DTR normal. Strength 5/5 in all 4.  Psychiatric: Normal judgment and insight. Alert and oriented x 3. Normal mood.    Labs on Admission: I have personally reviewed following labs and imaging studies  CBC: Recent Labs  Lab 11/19/16 1902  WBC 7.2  NEUTROABS 4.9  HGB 12.9*  HCT 40.2  MCV 91.6  PLT 017   Basic Metabolic Panel: Recent Labs  Lab 11/19/16 1902  NA 137  K 4.3  CL 107  CO2 22  GLUCOSE 133*  BUN 28*  CREATININE 2.38*  CALCIUM 8.9   GFR: Estimated Creatinine Clearance: 24.1 mL/min (A) (by C-G formula based on SCr of 2.38 mg/dL (H)). Liver Function Tests: Recent Labs  Lab 11/19/16 1902  AST 15  ALT 10*  ALKPHOS 89  BILITOT 0.5  PROT 7.0  ALBUMIN 3.5   No results for input(s): LIPASE, AMYLASE in the last 168 hours. No results for input(s): AMMONIA in the last 168 hours. Coagulation Profile: Recent Labs  Lab 11/19/16 2124  INR  0.98   Cardiac Enzymes: No results for input(s): CKTOTAL, CKMB, CKMBINDEX, TROPONINI in the last 168 hours. BNP (last 3 results) No results for input(s): PROBNP in the last 8760 hours. HbA1C: No results for input(s): HGBA1C in the last 72 hours. CBG: No results for input(s): GLUCAP in the last 168 hours. Lipid Profile: No results for input(s): CHOL, HDL, LDLCALC, TRIG, CHOLHDL, LDLDIRECT in the last 72 hours. Thyroid Function Tests: No results for input(s): TSH, T4TOTAL, FREET4, T3FREE, THYROIDAB in the last 72 hours. Anemia Panel: No results for input(s): VITAMINB12, FOLATE, FERRITIN, TIBC,  IRON, RETICCTPCT in the last 72 hours. Urine analysis:    Component Value Date/Time   COLORURINE RED (A) 08/14/2010 0658   APPEARANCEUR TURBID (A) 08/14/2010 0658   LABSPEC 1.012 08/14/2010 0658   PHURINE 6.0 08/14/2010 0658   GLUCOSEU NEGATIVE 08/14/2010 0658   HGBUR LARGE (A) 08/14/2010 0658   BILIRUBINUR NEGATIVE 08/14/2010 0658   KETONESUR 15 (A) 08/14/2010 0658   PROTEINUR 100 (A) 08/14/2010 0658   UROBILINOGEN 0.2 08/14/2010 0658   NITRITE NEGATIVE 08/14/2010 0658   LEUKOCYTESUR LARGE (A) 08/14/2010 0658    Radiological Exams on Admission: Ct Head Wo Contrast  Result Date: 11/19/2016 CLINICAL DATA:  Unsteady gait, dizziness and tinnitus beginning this morning. EXAM: CT HEAD WITHOUT CONTRAST TECHNIQUE: Contiguous axial images were obtained from the base of the skull through the vertex without intravenous contrast. COMPARISON:  CT HEAD July 08, 2010 FINDINGS: BRAIN: No intraparenchymal hemorrhage, mass effect nor midline shift. The ventricles and sulci are normal for age. Patchy supratentorial white matter hypodensities less than expected for patient's age, though non-specific are most compatible with chronic small vessel ischemic disease. No acute large vascular territory infarcts. No abnormal extra-axial fluid collections. Basal cisterns are patent. VASCULAR: Moderate calcific atherosclerosis of the carotid siphons. SKULL: No skull fracture. New 2.6 cm soft tissue solid mass LEFT frontal vertex scalp, no underlying osseous abnormality. SINUSES/ORBITS: Paranasal sinus are well aerated. Under pneumatized LEFT mastoid air cells.The included ocular globes and orbital contents are non-suspicious. Similar enophthalmos. OTHER: Patient is edentulous . IMPRESSION: 1. Trace LEFT frontal acute to subacute subarachnoid hemorrhage. Recommend MRI of the brain on nonemergent basis. 2. Otherwise negative CT HEAD for age. 3. New 2.5 cm soft tissue mass LEFT frontal scalp concerning for skin cancer.  Recommend dermatologic consultation on a nonemergent basis. 4. Critical Value/emergent results were called by telephone at the time of interpretation on 11/19/2016 at 11:25 pm to Dr. Dorie Rank , who verbally acknowledged these results. Electronically Signed   By: Elon Alas M.D.   On: 11/19/2016 23:27    EKG: Independently reviewed.  Assessment/Plan Principal Problem:   SAH (subarachnoid hemorrhage) (HCC) Active Problems:   Hypothyroidism   Neoplasm of skin of scalp    1. SAH - 1. Probably not aneurysmal bleed according to neurology. 2. Admit to SDU 3. Tele monitor 4. Avoid anticoagulants 5. MRI/MRA pending - further work up based on findings 1. Still in scanner, MRA in progress, so far looks suspicious to me that he may not have flow in the L ICA 2. Neoplasm of skin of scalp - 1. Worrisome for malignant neoplasm 2. Call derm vs surgery in AM to see if anyone can surgically remove. 3. Hypothyroidism - 1. Check TSH 4. CKD stage 3 - Chronic, actually looks better than prior labs in 2012 with creat 2.3 today.  DVT prophylaxis: SCDs Code Status: Full Family Communication: Son at bedside Disposition Plan: Home after admit Consults called:  Neuro Admission status: Admit to inpatient   Etta Quill DO [facility] Pager [phone]  If 7AM-7PM, please contact day team taking care of patient [email or web address] Password [email or web address]  11/20/2016, 1:54 AM

## 2016-11-21 ENCOUNTER — Inpatient Hospital Stay (HOSPITAL_COMMUNITY): Payer: Medicare Other

## 2016-11-21 ENCOUNTER — Ambulatory Visit (HOSPITAL_COMMUNITY): Payer: Medicare Other

## 2016-11-21 ENCOUNTER — Encounter (HOSPITAL_COMMUNITY): Payer: Self-pay | Admitting: Radiology

## 2016-11-21 DIAGNOSIS — I351 Nonrheumatic aortic (valve) insufficiency: Secondary | ICD-10-CM

## 2016-11-21 DIAGNOSIS — I6522 Occlusion and stenosis of left carotid artery: Secondary | ICD-10-CM

## 2016-11-21 DIAGNOSIS — N183 Chronic kidney disease, stage 3 (moderate): Secondary | ICD-10-CM

## 2016-11-21 DIAGNOSIS — I63232 Cerebral infarction due to unspecified occlusion or stenosis of left carotid arteries: Secondary | ICD-10-CM

## 2016-11-21 DIAGNOSIS — I609 Nontraumatic subarachnoid hemorrhage, unspecified: Secondary | ICD-10-CM

## 2016-11-21 DIAGNOSIS — D492 Neoplasm of unspecified behavior of bone, soft tissue, and skin: Secondary | ICD-10-CM

## 2016-11-21 DIAGNOSIS — I6521 Occlusion and stenosis of right carotid artery: Secondary | ICD-10-CM

## 2016-11-21 LAB — BASIC METABOLIC PANEL
Anion gap: 4 — ABNORMAL LOW (ref 5–15)
BUN: 21 mg/dL — ABNORMAL HIGH (ref 6–20)
CO2: 21 mmol/L — ABNORMAL LOW (ref 22–32)
Calcium: 8.4 mg/dL — ABNORMAL LOW (ref 8.9–10.3)
Chloride: 114 mmol/L — ABNORMAL HIGH (ref 101–111)
Creatinine, Ser: 2.26 mg/dL — ABNORMAL HIGH (ref 0.61–1.24)
GFR calc Af Amer: 31 mL/min — ABNORMAL LOW (ref >60)
GFR calc non Af Amer: 27 mL/min — ABNORMAL LOW (ref >60)
Glucose, Bld: 80 mg/dL (ref 65–99)
Potassium: 4.3 mmol/L (ref 3.5–5.1)
Sodium: 139 mmol/L (ref 135–145)

## 2016-11-21 LAB — CBC
HCT: 35.2 % — ABNORMAL LOW (ref 39.0–52.0)
Hemoglobin: 11.1 g/dL — ABNORMAL LOW (ref 13.0–17.0)
MCH: 28.8 pg (ref 26.0–34.0)
MCHC: 31.5 g/dL (ref 30.0–36.0)
MCV: 91.2 fL (ref 78.0–100.0)
Platelets: 157 10*3/uL (ref 150–400)
RBC: 3.86 MIL/uL — ABNORMAL LOW (ref 4.22–5.81)
RDW: 13.4 % (ref 11.5–15.5)
WBC: 5.7 10*3/uL (ref 4.0–10.5)

## 2016-11-21 LAB — ECHOCARDIOGRAM COMPLETE
Height: 69 in
Weight: 2240 oz

## 2016-11-21 LAB — T4, FREE: Free T4: 0.72 ng/dL (ref 0.61–1.12)

## 2016-11-21 MED ORDER — ASPIRIN EC 81 MG PO TBEC
81.0000 mg | DELAYED_RELEASE_TABLET | Freq: Every day | ORAL | Status: DC
  Administered 2016-11-21 – 2016-11-23 (×3): 81 mg via ORAL
  Filled 2016-11-21 (×3): qty 1

## 2016-11-21 NOTE — Consult Note (Signed)
Chief Complaint: Patient was seen in consultation today for cerebral arteriogram Chief Complaint  Patient presents with  . Unsteady Gait    Ear discomfort   at the request of Dr Lavera Guise  Supervising Physician: Luanne Bras  Patient Status: [facility] - In-pt  History of Present Illness: Keith [name] is a 76 y.o. male   Right sided weakness Dizziness; unsteady Presented to ED 11/4 Evaluation and imaging proved [facility] 11/5: IMPRESSION: MRI HEAD: 1. Multifocal acute small frontal/ LEFT MCA territory nonhemorrhagic infarct. 2. 3 x 2.7 x 1.4 cm solid frontal scalp mass consistent with neoplasm, no calvarial invasion. Recommend non emergent dermatologic consultation. 3. No MR correlate for suspected LEFT frontal lobe subarachnoid hemorrhage ; please note MRI is frequently insensitive to hyperacute subarachnoid hemorrhage. 4. Recommend CTA HEAD and neck for further characterization.  MRA HEAD: 1. Occluded LEFT internal carotid artery with thready reconstitution LEFT supraclinoid segment. 2. Slow flow LEFT middle cerebral artery, occluded LEFT M2 segment. 3. Amorphous LEFT A1-2 junction, this could reflect small aneurysm, fenestration or turbulent retrograde flow. 4. Moderate stenosis RIGHT supraclinoid internal carotid artery. Suspected severe stenosis RIGHT A1 origin. 5. Recommend CTA HEAD and neck for further characterization.  Right ICA stenosis Dr Erlinda Hong has requested cerebral arteriogram  Dr Estanislado Pandy has reviewed imaging and approves procedure   Past Medical History:  Diagnosis Date  . Hypothyroidism   . Obstructive uropathy     History reviewed. No pertinent surgical history.  Allergies: Penicillins  Medications: Prior to Admission medications   Not on File     Family History  Problem Relation Age of Onset  . Lung cancer Father     Social History   Socioeconomic History  . Marital status: Married    Spouse name: None  . Number of children: None   . Years of education: None  . Highest education level: None  Social Needs  . Financial resource strain: None  . Food insecurity - worry: None  . Food insecurity - inability: None  . Transportation needs - medical: None  . Transportation needs - non-medical: None  Occupational History  . None  Tobacco Use  . Smoking status: Never Smoker  . Smokeless tobacco: Never Used  Substance and Sexual Activity  . Alcohol use: No  . Drug use: No  . Sexual activity: None  Other Topics Concern  . None  Social History Narrative  . None    Review of Systems: A 12 point ROS discussed and pertinent positives are indicated in the HPI above.  All other systems are negative.  Review of Systems  Constitutional: Positive for activity change. Negative for fatigue and fever.  HENT: Positive for tinnitus.   Respiratory: Negative for shortness of breath.   Cardiovascular: Negative for chest pain.  Musculoskeletal: Positive for gait problem.  Neurological: Positive for dizziness and weakness. Negative for tremors, seizures, syncope, facial asymmetry, speech difficulty, light-headedness, numbness and headaches.  Psychiatric/Behavioral: Negative for behavioral problems and confusion.    Vital Signs: BP (!) 122/39   Pulse 63   Temp 98.6 F (37 C) (Oral)   Resp 16   Ht 5\' 9"  (1.753 m)   Wt 140 lb (63.5 kg)   SpO2 97%   BMI 20.67 kg/m   Physical Exam  Constitutional: He is oriented to person, place, and time.  Cardiovascular: Normal rate, regular rhythm and normal heart sounds.  Pulmonary/Chest: Effort normal and breath sounds normal.  Abdominal: Soft.  Musculoskeletal: Normal range of motion.  Neurological:  He is alert and oriented to person, place, and time.  Skin: Skin is warm and dry.  Psychiatric: He has a normal mood and affect. His behavior is normal. Judgment and thought content normal.  Nursing note and vitals reviewed.   Imaging: Ct Head Wo Contrast  Result Date:  11/21/2016 CLINICAL DATA:  Follow-up subarachnoid hemorrhage and acute LEFT MCA territory infarct. EXAM: CT HEAD WITHOUT CONTRAST TECHNIQUE: Contiguous axial images were obtained from the base of the skull through the vertex without intravenous contrast. COMPARISON:  CT HEAD November 19, 2016 and MRI of the head November 20, 2016 FINDINGS: BRAIN: No subarachnoid hemorrhage or extra-axial density present on this CT. Wedge-like hypodensity LEFT frontal lobe extending from operculum to insula. No intraparenchymal hemorrhage, mass effect nor midline shift. The ventricles and sulci are normal for age. Patchy supratentorial white matter hypodensities less than expected for patient's age, though non-specific are most compatible with chronic small vessel ischemic disease. No acute large vascular territory infarcts. No abnormal extra-axial fluid collections. Basal cisterns are patent. VASCULAR: Moderate calcific atherosclerosis of the carotid siphons. SKULL: No skull fracture. 2.6 cm solid mass LEFT frontal vertex scalp without underlying osseous abnormality. SINUSES/ORBITS: Included paranasal sinuses are well-aerated. Under pneumatized LEFT mastoid air cells.The included ocular globes and orbital contents are non-suspicious. Similar enophthalmos. OTHER: None. IMPRESSION: 1. No subarachnoid hemorrhage on today's CT. 2. Evolving nonhemorrhagic small LEFT frontal/MCA territory infarct. 3. 2.6 cm soft tissue mass LEFT frontal scalp, recommend nonemergent dermal a Hologic consultation. Electronically Signed   By: Elon Alas M.D.   On: 11/21/2016 06:04   Ct Head Wo Contrast  Result Date: 11/19/2016 CLINICAL DATA:  Unsteady gait, dizziness and tinnitus beginning this morning. EXAM: CT HEAD WITHOUT CONTRAST TECHNIQUE: Contiguous axial images were obtained from the base of the skull through the vertex without intravenous contrast. COMPARISON:  CT HEAD July 08, 2010 FINDINGS: BRAIN: No intraparenchymal hemorrhage, mass  effect nor midline shift. The ventricles and sulci are normal for age. Patchy supratentorial white matter hypodensities less than expected for patient's age, though non-specific are most compatible with chronic small vessel ischemic disease. No acute large vascular territory infarcts. No abnormal extra-axial fluid collections. Basal cisterns are patent. VASCULAR: Moderate calcific atherosclerosis of the carotid siphons. SKULL: No skull fracture. New 2.6 cm soft tissue solid mass LEFT frontal vertex scalp, no underlying osseous abnormality. SINUSES/ORBITS: Paranasal sinus are well aerated. Under pneumatized LEFT mastoid air cells.The included ocular globes and orbital contents are non-suspicious. Similar enophthalmos. OTHER: Patient is edentulous . IMPRESSION: 1. Trace LEFT frontal acute to subacute subarachnoid hemorrhage. Recommend MRI of the brain on nonemergent basis. 2. Otherwise negative CT HEAD for age. 3. New 2.5 cm soft tissue mass LEFT frontal scalp concerning for skin cancer. Recommend dermatologic consultation on a nonemergent basis. 4. Critical Value/emergent results were called by telephone at the time of interpretation on 11/19/2016 at 11:25 pm to Dr. Dorie Rank , who verbally acknowledged these results. Electronically Signed   By: Elon Alas M.D.   On: 11/19/2016 23:27   Mr Jodene Nam Head Wo Contrast  Result Date: 11/20/2016 CLINICAL DATA:  Unsteady gait, dizziness and tinnitus beginning this morning. Suspect stroke. Follow-up potential subarachnoid hemorrhage. EXAM: MRI HEAD WITHOUT CONTRAST MRA HEAD WITHOUT CONTRAST TECHNIQUE: Multiplanar, multiecho pulse sequences of the brain and surrounding structures were obtained without intravenous contrast. Angiographic images of the head were obtained using MRA technique without contrast. COMPARISON:  CT HEAD November 19, 2016 FINDINGS: MRI HEAD FINDINGS BRAIN: Patchy reduced diffusion  LEFT insula, LEFT frontal lobe extending to the operculum with low ADC  values and faint T2 hyperintense signal. No abnormal intracranial susceptibility artifact. Focal susceptibility artifact and T1 shortening LEFT MCA at the level of the insula. Ventricles and sulci are normal for patient's. No additional levels of with normal signal. No midline shift, mass effect or masses. No abnormal extra-axial fluid collections. VASCULAR: T2 bright signal LEFT internal carotid artery from cervical segment to supraclinoid segment. SKULL AND UPPER CERVICAL SPINE: No abnormal sellar expansion. No suspicious calvarial bone marrow signal. Craniocervical junction maintained. 3 x 2.7 x 1.4 cm (AP by transverse by CC) solid midline frontal scalp mass without calvarial invasion. SINUSES/ORBITS: Chronic LEFT mastoid effusion. Paranasal sinus are well aerated. The included ocular globes and orbital contents are non-suspicious. OTHER: Patient is edentulous. MRA HEAD FINDINGS ANTERIOR CIRCULATION: Complete loss of flow related enhancement LEFT internal carotid artery from the included cervical segment. Thready reconstitution supraclinoid LEFT internal carotid artery. Normal flow related enhancement of the RIGHT included cervical, petrous, cavernous and supraclinoid internal carotid arteries. Moderate stenosis RIGHT supraclinoid internal carotid artery stenosis. Patent anterior communicating artery, amorphous LEFT A1-2 junction, possible fenestrated versus small aneurysm. Suspected severe stenosis RIGHT A1 origin. Patent presumable retrograde flow LEFT A1 segment. Thready flow related enhancement LEFT middle cerebral artery, occluded LEFT M2 segment. Normal flow related enhancement RIGHT middle cerebral artery. POSTERIOR CIRCULATION: RIGHT vertebral artery is dominant. Basilar artery is patent, with normal flow related enhancement of the main branch vessels. Fenestrated vertebrobasilar junction. Patent posterior cerebral arteries. Posterior communicating artery's not identified. No large vessel occlusion, flow  limiting stenosis,  aneurysm. ANATOMIC VARIANTS: None. Source images and MIP images were reviewed. IMPRESSION: MRI HEAD: 1. Multifocal acute small frontal/ LEFT MCA territory nonhemorrhagic infarct. 2. 3 x 2.7 x 1.4 cm solid frontal scalp mass consistent with neoplasm, no calvarial invasion. Recommend non emergent dermatologic consultation. 3. No MR correlate for suspected LEFT frontal lobe subarachnoid hemorrhage ; please note MRI is frequently insensitive to hyperacute subarachnoid hemorrhage. 4. Recommend CTA HEAD and neck for further characterization. MRA HEAD: 1. Occluded LEFT internal carotid artery with thready reconstitution LEFT supraclinoid segment. 2. Slow flow LEFT middle cerebral artery, occluded LEFT M2 segment. 3. Amorphous LEFT A1-2 junction, this could reflect small aneurysm, fenestration or turbulent retrograde flow. 4. Moderate stenosis RIGHT supraclinoid internal carotid artery. Suspected severe stenosis RIGHT A1 origin. 5. Recommend CTA HEAD and neck for further characterization. Acute findings discussed with and reconfirmed by Dr.Kirkpatrick, Neurology on 11/20/2016 at 2:15 am. Electronically Signed   By: Elon Alas M.D.   On: 11/20/2016 02:45   Mr Brain Wo Contrast  Result Date: 11/20/2016 CLINICAL DATA:  Unsteady gait, dizziness and tinnitus beginning this morning. Suspect stroke. Follow-up potential subarachnoid hemorrhage. EXAM: MRI HEAD WITHOUT CONTRAST MRA HEAD WITHOUT CONTRAST TECHNIQUE: Multiplanar, multiecho pulse sequences of the brain and surrounding structures were obtained without intravenous contrast. Angiographic images of the head were obtained using MRA technique without contrast. COMPARISON:  CT HEAD November 19, 2016 FINDINGS: MRI HEAD FINDINGS BRAIN: Patchy reduced diffusion LEFT insula, LEFT frontal lobe extending to the operculum with low ADC values and faint T2 hyperintense signal. No abnormal intracranial susceptibility artifact. Focal susceptibility artifact  and T1 shortening LEFT MCA at the level of the insula. Ventricles and sulci are normal for patient's. No additional levels of with normal signal. No midline shift, mass effect or masses. No abnormal extra-axial fluid collections. VASCULAR: T2 bright signal LEFT internal carotid artery from cervical segment to  supraclinoid segment. SKULL AND UPPER CERVICAL SPINE: No abnormal sellar expansion. No suspicious calvarial bone marrow signal. Craniocervical junction maintained. 3 x 2.7 x 1.4 cm (AP by transverse by CC) solid midline frontal scalp mass without calvarial invasion. SINUSES/ORBITS: Chronic LEFT mastoid effusion. Paranasal sinus are well aerated. The included ocular globes and orbital contents are non-suspicious. OTHER: Patient is edentulous. MRA HEAD FINDINGS ANTERIOR CIRCULATION: Complete loss of flow related enhancement LEFT internal carotid artery from the included cervical segment. Thready reconstitution supraclinoid LEFT internal carotid artery. Normal flow related enhancement of the RIGHT included cervical, petrous, cavernous and supraclinoid internal carotid arteries. Moderate stenosis RIGHT supraclinoid internal carotid artery stenosis. Patent anterior communicating artery, amorphous LEFT A1-2 junction, possible fenestrated versus small aneurysm. Suspected severe stenosis RIGHT A1 origin. Patent presumable retrograde flow LEFT A1 segment. Thready flow related enhancement LEFT middle cerebral artery, occluded LEFT M2 segment. Normal flow related enhancement RIGHT middle cerebral artery. POSTERIOR CIRCULATION: RIGHT vertebral artery is dominant. Basilar artery is patent, with normal flow related enhancement of the main branch vessels. Fenestrated vertebrobasilar junction. Patent posterior cerebral arteries. Posterior communicating artery's not identified. No large vessel occlusion, flow limiting stenosis,  aneurysm. ANATOMIC VARIANTS: None. Source images and MIP images were reviewed. IMPRESSION: MRI HEAD:  1. Multifocal acute small frontal/ LEFT MCA territory nonhemorrhagic infarct. 2. 3 x 2.7 x 1.4 cm solid frontal scalp mass consistent with neoplasm, no calvarial invasion. Recommend non emergent dermatologic consultation. 3. No MR correlate for suspected LEFT frontal lobe subarachnoid hemorrhage ; please note MRI is frequently insensitive to hyperacute subarachnoid hemorrhage. 4. Recommend CTA HEAD and neck for further characterization. MRA HEAD: 1. Occluded LEFT internal carotid artery with thready reconstitution LEFT supraclinoid segment. 2. Slow flow LEFT middle cerebral artery, occluded LEFT M2 segment. 3. Amorphous LEFT A1-2 junction, this could reflect small aneurysm, fenestration or turbulent retrograde flow. 4. Moderate stenosis RIGHT supraclinoid internal carotid artery. Suspected severe stenosis RIGHT A1 origin. 5. Recommend CTA HEAD and neck for further characterization. Acute findings discussed with and reconfirmed by Dr.Kirkpatrick, Neurology on 11/20/2016 at 2:15 am. Electronically Signed   By: Elon Alas M.D.   On: 11/20/2016 02:45    Labs:  CBC: Recent Labs    11/19/16 1902 11/21/16 0508  WBC 7.2 5.7  HGB 12.9* 11.1*  HCT 40.2 35.2*  PLT 239 157    COAGS: Recent Labs    11/19/16 2124  INR 0.98  APTT 31    BMP: Recent Labs    11/19/16 1902 11/21/16 0508  NA 137 139  K 4.3 4.3  CL 107 114*  CO2 22 21*  GLUCOSE 133* 80  BUN 28* 21*  CALCIUM 8.9 8.4*  CREATININE 2.38* 2.26*  GFRNONAA 25* 27*  GFRAA 29* 31*    LIVER FUNCTION TESTS: Recent Labs    11/19/16 1902  BILITOT 0.5  AST 15  ALT 10*  ALKPHOS 89  PROT 7.0  ALBUMIN 3.5    TUMOR MARKERS: No results for input(s): AFPTM, CEA, CA199, CHROMGRNA in the last 8760 hours.  Assessment and Plan:  CVA SAH Rt side weakness R ICA stenosis Scheduled for cerebral arteriogram in am Risks and benefits of cerebral arteriogram were discussed with the patient including, but not limited to bleeding,  infection, vascular injury, contrast induced renal failure, stroke or even death. This interventional procedure involves the use of X-rays and because of the nature of the planned procedure, it is possible that we will have prolonged use of X-ray fluoroscopy. Potential radiation risks to  you include (but are not limited to) the following: - A slightly elevated risk for cancer  several years later in life. This risk is typically less than 0.5% percent. This risk is low in comparison to the normal incidence of human cancer, which is 33% for women and 50% for men according to the [place]. - Radiation induced injury can include skin redness, resembling a rash, tissue breakdown / ulcers and hair loss (which can be temporary or permanent).  The likelihood of either of these occurring depends on the difficulty of the procedure and whether you are sensitive to radiation due to previous procedures, disease, or genetic conditions.  IF your procedure requires a prolonged use of radiation, you will be notified and given written instructions for further action.  It is your responsibility to monitor the irradiated area for the 2 weeks following the procedure and to notify your physician if you are concerned that you have suffered a radiation induced injury.    All of the patient's questions were answered, patient is agreeable to proceed. Consent signed and in chart.  Thank you for this interesting consult.  I greatly enjoyed meeting Keith [name] and look forward to participating in their care.  A copy of this report was sent to the requesting provider on this date.  Electronically Signed: Lavonia Drafts, PA-C 11/21/2016, 2:00 PM   I spent a total of 40 Minutes    in face to face in clinical consultation, greater than 50% of which was counseling/coordinating care for cerebral arteriogram

## 2016-11-21 NOTE — Progress Notes (Signed)
Physical Therapy Treatment Patient Details Name: Keith [name] MRN: [id number] DOB: Jul 09, 1940 Today's Date: 11/21/2016    History of Present Illness 76 y.o. male with medical history significant of Hypothyroidism.  Patient admitted with c/o R sided weakness with speech deficits in the ED. Lt MCA CVA.  Also shows a mass on top of skull that is worrisome for malignancy.    PT Comments    Pt with excellent improvement from yesterday. Pt able to speak much more fluently, state name, birthdate, converse throughout session. Pt with noted improved strength and function of RUE and RLE. Bil LE 4+/5 for all myotomes today with pt continuing to have difficulty with coordination with heel to shin and toe tapping bil. Pt with improved gait and balance and would still benefit from CIR to maximize independence for hopeful return home. Pt remains a  significant fall risk and requires assist for narrowed BOS and balance challenges.   Follow Up Recommendations  CIR     Equipment Recommendations       Recommendations for Other Services       Precautions / Restrictions Precautions Precautions: Fall    Mobility  Bed Mobility Overal bed mobility: Modified Independent                Transfers Overall transfer level: Needs assistance   Transfers: Sit to/from Stand Sit to Stand: Min guard         General transfer comment: pt with ability to stand from bed and perform sit<>stand x 5 without use of bil UE with guarding for lines and safety, 1 episode of LOB  Ambulation/Gait Ambulation/Gait assistance: Min guard Ambulation Distance (Feet): 250 Feet Assistive device: Hemi-walker;None Gait Pattern/deviations: Step-through pattern;Decreased stride length;Trunk flexed   Gait velocity interpretation: Below normal speed for age/gender General Gait Details: pt initiating gait with hemiwalker but after 50' removed hemiwalker with pt able to walk remainder of hallway with cues for posture and  looking up, no right lean in standing today.    Stairs            Wheelchair Mobility    Modified Rankin (Stroke Patients Only) Modified Rankin (Stroke Patients Only) Pre-Morbid Rankin Score: No symptoms Modified Rankin: Moderate disability     Balance Overall balance assessment: Needs assistance   Sitting balance-[name] Scale: Good       Standing balance-[name] Scale: Fair   Single Leg Stance - Right Leg: 1 Single Leg Stance - Left Leg: 1 Tandem Stance - Right Leg: 1 Tandem Stance - Left Leg: 1 Rhomberg - Eyes Opened: 45 Rhomberg - Eyes Closed: 20     Standardized Balance Assessment Standardized Balance Assessment : Berg Balance Test Berg Balance Test Sit to Stand: Able to stand without using hands and stabilize independently Standing Unsupported: Able to stand 2 minutes with supervision Sitting with Back Unsupported but Feet Supported on Floor or Stool: Able to sit safely and securely 2 minutes Stand to Sit: Sits safely with minimal use of hands Transfers: Able to transfer safely, minor use of hands Standing Unsupported with Eyes Closed: Able to stand 10 seconds safely Standing Ubsupported with Feet Together: Able to place feet together independently and stand 1 minute safely From Standing, Reach Forward with Outstretched Arm: Can reach confidently >25 cm (10") From Standing Position, Pick up Object from Floor: Able to pick up shoe safely and easily From Standing Position, Turn to Look Behind Over each Shoulder: Turn sideways only but maintains balance Turn 360 Degrees: Needs close supervision or  verbal cueing Standing Unsupported, Alternately Place Feet on Step/Stool: Needs assistance to keep from falling or unable to try Standing Unsupported, One Foot in Front: Loses balance while stepping or standing Standing on One Leg: Unable to try or needs assist to prevent fall Total Score: 38        Cognition Arousal/Alertness: Awake/alert Behavior During Therapy:  WFL for tasks assessed/performed Overall Cognitive Status: Within Functional Limits for tasks assessed                                 General Comments: pt aware of RUE deficits and attending to placement of RUE today      Exercises General Exercises - Lower Extremity Long Arc Quad: AROM;Right;15 reps;Seated Hip Flexion/Marching: AROM;Right;15 reps;Seated    General Comments        Pertinent Vitals/Pain Pain Assessment: No/denies pain    Home Living                      Prior Function            PT Goals (current goals can now be found in the care plan section) Progress towards PT goals: Progressing toward goals    Frequency           PT Plan Current plan remains appropriate    Co-evaluation              AM-PAC PT "6 Clicks" Daily Activity  Outcome Measure  Difficulty turning over in bed (including adjusting bedclothes, sheets and blankets)?: A Verde Difficulty moving from lying on back to sitting on the side of the bed? : A Borowski Difficulty sitting down on and standing up from a chair with arms (e.g., wheelchair, bedside commode, etc,.)?: A Tollison Help needed moving to and from a bed to chair (including a wheelchair)?: A Fan Help needed walking in hospital room?: A Granier Help needed climbing 3-5 steps with a railing? : A Plotz 6 Click Score: 18    End of Session Equipment Utilized During Treatment: Gait belt Activity Tolerance: Patient tolerated treatment well Patient left: in chair;with family/visitor present;with call bell/phone within reach Nurse Communication: Mobility status PT Visit Diagnosis: Unsteadiness on feet (R26.81);Other abnormalities of gait and mobility (R26.89);Hemiplegia and hemiparesis Hemiplegia - Right/Left: Right Hemiplegia - dominant/non-dominant: Dominant Hemiplegia - caused by: Cerebral infarction     Time: 0821-0844 PT Time Calculation (min) (ACUTE ONLY): 23 min  Charges:  $Gait Training:  8-22 mins $Physical Performance Test: 8-22 mins                    G Codes:       Elwyn Reach, PT [phone]    [place] B Daniella Dewberry 11/21/2016, 8:59 AM

## 2016-11-21 NOTE — Progress Notes (Signed)
Pt received with grand daughter at bedside. Alert, verbal with no noted distress. Pt stable, neuro intact. Telemetry monitoring. Pt oriented to room. Safety measures in place. Call bell within reach. Will continue to monitoring.

## 2016-11-21 NOTE — Progress Notes (Signed)
  Speech Language Pathology Treatment: Dysphagia  Patient Details Name: Keith [name] MRN: [id number] DOB: 09-10-1940 Today's Date: 11/21/2016 Time: 7530-0511 SLP Time Calculation (min) (ACUTE ONLY): 12 min  Assessment / Plan / Recommendation Clinical Impression  Pt is communicating much more fluently today with only mild dysarthria noted. SLP administered advanced, soft solid trials with prolonged mastication observed but no pocketing with Min cues from SLP for oral clearance. Suspect that his prolonged bolus formation is likely baseline due to the condition of his dentures. Recommend advancement to Dys 3 diet continuing thin liquids. Pt will still be a great candidate for CIR and is very motivated.   HPI HPI: 76 y.o. male with medical history significant of Hypothyroidism. Patient admitted with c/o R sided weakness with speech deficits in the ED. CT head shows very small, trace SAH in Lt frontal region. MRI shows multifocal acute small frontal/L MCA territory infarcts.Imaging also shows a mass on top of skull that is worrisome for malignancy.      SLP Plan  Continue with current plan of care       Recommendations  Diet recommendations: Dysphagia 3 (mechanical soft);Thin liquid Liquids provided via: Cup;Straw Medication Administration: Whole meds with puree Supervision: Patient able to self feed;Intermittent supervision to cue for compensatory strategies Compensations: Slow rate;Small sips/bites;Follow solids with liquid Postural Changes and/or Swallow Maneuvers: Seated upright 90 degrees;Upright 30-60 min after meal                Oral Care Recommendations: Oral care BID Follow up Recommendations: Inpatient Rehab SLP Visit Diagnosis: Dysphagia, oral phase (R13.11) Plan: Continue with current plan of care       GO                Germain Osgood 11/21/2016, 12:19 PM  Germain Osgood, M.A. CCC-SLP [phone]

## 2016-11-21 NOTE — Progress Notes (Signed)
STROKE TEAM PROGRESS NOTE   SUBJECTIVE (INTERVAL HISTORY) His son is at the bedside.  Overall he feels his condition is much improved. Walking with PT in the hallway without difficulty.  Right facial droop also improved.  Repeat CT showed resolution of trace SAH, will schedule for cerebral angiogram.    OBJECTIVE Temp:  [98.1 F (36.7 C)-99.9 F (37.7 C)] 98.6 F (37 C) (11/06 0800) Pulse Rate:  [57-78] 63 (11/06 1100) Cardiac Rhythm: Normal sinus rhythm (11/06 0800) Resp:  [11-28] 16 (11/06 1100) BP: (107-160)/(39-73) 122/39 (11/06 1100) SpO2:  [94 %-99 %] 97 % (11/06 1100)  No results for input(s): GLUCAP in the last 168 hours. Recent Labs  Lab 11/19/16 1902 11/21/16 0508  NA 137 139  K 4.3 4.3  CL 107 114*  CO2 22 21*  GLUCOSE 133* 80  BUN 28* 21*  CREATININE 2.38* 2.26*  CALCIUM 8.9 8.4*   Recent Labs  Lab 11/19/16 1902  AST 15  ALT 10*  ALKPHOS 89  BILITOT 0.5  PROT 7.0  ALBUMIN 3.5   Recent Labs  Lab 11/19/16 1902 11/21/16 0508  WBC 7.2 5.7  NEUTROABS 4.9  --   HGB 12.9* 11.1*  HCT 40.2 35.2*  MCV 91.6 91.2  PLT 239 157   No results for input(s): CKTOTAL, CKMB, CKMBINDEX, TROPONINI in the last 168 hours. Recent Labs    11/19/16 2124  LABPROT 12.9  INR 0.98   No results for input(s): COLORURINE, LABSPEC, PHURINE, GLUCOSEU, HGBUR, BILIRUBINUR, KETONESUR, PROTEINUR, UROBILINOGEN, NITRITE, LEUKOCYTESUR in the last 72 hours.  Invalid input(s): APPERANCEUR     Component Value Date/Time   CHOL 183 11/20/2016 0245   TRIG 115 11/20/2016 0245   HDL 35 (L) 11/20/2016 0245   CHOLHDL 5.2 11/20/2016 0245   VLDL 23 11/20/2016 0245   LDLCALC 125 (H) 11/20/2016 0245   Lab Results  Component Value Date   HGBA1C 5.5 11/20/2016   No results found for: LABOPIA, COCAINSCRNUR, LABBENZ, AMPHETMU, THCU, LABBARB  No results for input(s): ETH in the last 168 hours.  I have personally reviewed the radiological images below and agree with the radiology  interpretations.  Ct Head Wo Contrast 11/19/2016 IMPRESSION: 1. Trace LEFT frontal acute to subacute subarachnoid hemorrhage. Recommend MRI of the brain on nonemergent basis. 2. Otherwise negative CT HEAD for age. 3. New 2.5 cm soft tissue mass LEFT frontal scalp concerning for skin cancer. Recommend dermatologic consultation on a nonemergent basis.    Mri and Mra Brain Wo Contrast 11/20/2016 IMPRESSION: MRI HEAD: 1. Multifocal acute small frontal/ LEFT MCA territory nonhemorrhagic infarct. 2. 3 x 2.7 x 1.4 cm solid frontal scalp mass consistent with neoplasm, no calvarial invasion. Recommend non emergent dermatologic consultation. 3. No MR correlate for suspected LEFT frontal lobe subarachnoid hemorrhage ; please note MRI is frequently insensitive to hyperacute subarachnoid hemorrhage. 4. Recommend CTA HEAD and neck for further characterization. MRA HEAD: 1. Occluded LEFT internal carotid artery with thready reconstitution LEFT supraclinoid segment. 2. Slow flow LEFT middle cerebral artery, occluded LEFT M2 segment. 3. Amorphous LEFT A1-2 junction, this could reflect small aneurysm, fenestration or turbulent retrograde flow. 4. Moderate stenosis RIGHT supraclinoid internal carotid artery. Suspected severe stenosis RIGHT A1 origin. 5. Recommend CTA HEAD and neck for further characterization.   Carotid Doppler  Right 60-79% mid internal carotid artery stenosis, left internal carotid artery appears occluded. Bilateral vertebral arteries appear patent and antegrade.  2D Echocardiogram  Pending  LE venous doppler  No evidence of deep vein thrombosis  or baker's cysts bilaterally.  Ct Head Wo Contrast 11/21/2016 IMPRESSION: 1. No subarachnoid hemorrhage on today's CT. 2. Evolving nonhemorrhagic small LEFT frontal/MCA territory infarct. 3. 2.6 cm soft tissue mass LEFT frontal scalp, recommend nonemergent dermal a Hologic consultation.     PHYSICAL EXAM  Temp:  [98.1 F (36.7 C)-99.9 F (37.7 C)]  98.6 F (37 C) (11/06 0800) Pulse Rate:  [57-78] 63 (11/06 1100) Resp:  [11-28] 16 (11/06 1100) BP: (107-160)/(39-73) 122/39 (11/06 1100) SpO2:  [94 %-99 %] 97 % (11/06 1100)  General - Well nourished, well developed, in no apparent distress.  Ophthalmologic - fundi not visualized due to noncooperation.  Cardiovascular - Regular rate and rhythm with no murmur.  Mental Status -  Level of arousal and orientation to time, place, and person were intact. Language including naming, repetition, comprehension was assessed and found intact, mild to moderate dysarthria Fund of Knowledge was assessed and was intact.  Cranial Nerves II - XII - II - Visual field intact OU. III, IV, VI - Extraocular movements intact. V - Facial sensation intact bilaterally. VII - right facial droop, improved. VIII - Hearing & vestibular intact bilaterally. X - Palate elevates symmetrically. XI - Chin turning & shoulder shrug intact bilaterally. XII - Tongue protrusion intact.  Motor Strength - The patient's strength was normal in LUE and LLE, however, RUE 4/5 with pronator drift and RLE proximal 5/5 and distal 4/5.  Bulk was normal and fasciculations were absent.   Motor Tone - Muscle tone was assessed at the neck and appendages and was normal.  Reflexes - The patient's reflexes were symmetrical in all extremities and he had no pathological reflexes.  Sensory - Light touch, temperature/pinprick were assessed and were symmetrical.    Coordination - The patient had normal movements in the left hand with no ataxia or dysmetria.  Tremor was absent.  Gait and Station - deferred.   ASSESSMENT/PLAN Keith [name] is a 76 y.o. male with no clear history with regular PCP follow up admitted for right side weakness and slurry speech. No tPA given due to out of window and question of SAH.    Stroke:  left MCA small infarct, embolic likely secondary to large vessel disease source of left ICA  occlusion  Resultant dysarthria and left hemiparesis  MRI  Left MCA infarct  MRA  Left ICA occlusion  Carotid Doppler  Left ICA occlusion and right ICA 60-79% stenosis  Repeat CT head showed resolution of trace SAH  We will plan for cerebral angio tomorrow  2D Echo  pending  LE venous doppler - negative  LDL 125  HgbA1c 5.5  SCDs for VTE prophylaxis DIET DYS 3 Room service appropriate? Yes; Fluid consistency: Thin  Diet NPO time specified   No antithrombotic prior to admission, now on aspirin 81  Patient counseled to be compliant with his antithrombotic medications  Ongoing aggressive stroke risk factor management  Therapy recommendations:  pending  Disposition:  Pending  ? SAH  Initial CT concerns trace SAH left high convexity  MRI did not show SAH  Repeat CT 11/21/16 showed resolution of SAH  Carotid artery disease  Pt not following with doctors, no previous image to compare  CUS and MRA confirmed left ICA occlusion  CUS right ICA 60-79% stenosis  Likely atherosclerosis etiology, less likely embolic  On aspirin 81  Plan for cerebral angiogram in a.m.  Hypertension Stable Permissive hypertension (OK if <220/120) for 24-48 hours post stroke and then gradually normalized  within 5-7 days.  Long term BP goal 130-150 due to carotid occlusion/stenosis  Continue IV fluid  Hyperlipidemia  Home meds:  none   LDL 125, goal < 70  Now on lipitor 40  Continue statin at discharge  Other Stroke Risk Factors  Advanced age  Other Active Problems  Pt interested in STROKE AF trial.   Not seeing doctors for years  Frontal scalp solid large nodule -outpatient dermatology follow-up  Hospital day # 1   Rosalin Hawking, MD PhD Stroke Neurology 11/21/2016 12:56 PM    To contact Stroke Continuity provider, please refer to [email or web address] After hours, contact General Neurology

## 2016-11-21 NOTE — Progress Notes (Signed)
  Echocardiogram 2D Echocardiogram has been performed.  Jannett Celestine 11/21/2016, 11:15 AM

## 2016-11-22 ENCOUNTER — Other Ambulatory Visit: Payer: Self-pay

## 2016-11-22 DIAGNOSIS — I69391 Dysphagia following cerebral infarction: Secondary | ICD-10-CM

## 2016-11-22 LAB — BASIC METABOLIC PANEL
Anion gap: 3 — ABNORMAL LOW (ref 5–15)
BUN: 19 mg/dL (ref 6–20)
CO2: 22 mmol/L (ref 22–32)
Calcium: 8.3 mg/dL — ABNORMAL LOW (ref 8.9–10.3)
Chloride: 113 mmol/L — ABNORMAL HIGH (ref 101–111)
Creatinine, Ser: 2.37 mg/dL — ABNORMAL HIGH (ref 0.61–1.24)
GFR calc Af Amer: 29 mL/min — ABNORMAL LOW (ref >60)
GFR calc non Af Amer: 25 mL/min — ABNORMAL LOW (ref >60)
Glucose, Bld: 90 mg/dL (ref 65–99)
Potassium: 4.3 mmol/L (ref 3.5–5.1)
Sodium: 138 mmol/L (ref 135–145)

## 2016-11-22 LAB — CBC
HCT: 34.2 % — ABNORMAL LOW (ref 39.0–52.0)
Hemoglobin: 10.9 g/dL — ABNORMAL LOW (ref 13.0–17.0)
MCH: 28.8 pg (ref 26.0–34.0)
MCHC: 31.9 g/dL (ref 30.0–36.0)
MCV: 90.2 fL (ref 78.0–100.0)
Platelets: 152 10*3/uL (ref 150–400)
RBC: 3.79 MIL/uL — ABNORMAL LOW (ref 4.22–5.81)
RDW: 13.1 % (ref 11.5–15.5)
WBC: 5.3 10*3/uL (ref 4.0–10.5)

## 2016-11-22 MED ORDER — SENNOSIDES-DOCUSATE SODIUM 8.6-50 MG PO TABS: 1.0000 | ORAL_TABLET | Freq: Every evening | ORAL | Status: DC | PRN

## 2016-11-22 MED ORDER — FLEET ENEMA 7-19 GM/118ML RE ENEM: 1.0000 | ENEMA | Freq: Every day | RECTAL | Status: DC | PRN

## 2016-11-22 NOTE — Progress Notes (Signed)
Inpatient Rehabilitation  Met with patient, son, and granddaughter at bedside to further discuss IP Rehab program and anticipated outcomes as well as answer questions.  Plan to continue to follow for timing of medical readiness.  If patient is ready following procedure today, we have a bed available to offer.  Updated nurse case manager.  Call if questions.   Carmelia Roller., CCC/SLP Admission Coordinator  [place]  Cell [phone]

## 2016-11-22 NOTE — Progress Notes (Signed)
STROKE TEAM PROGRESS NOTE   SUBJECTIVE (INTERVAL HISTORY) His family is at the bedside.  Overall he feels his condition is much improved. Patient is found sitting in bed waiting for Angiogram. States walked with PT yesterday. Voices no new concerns or worsening symptoms.   OBJECTIVE Temp:  [98.4 F (36.9 C)-98.8 F (37.1 C)] 98.5 F (36.9 C) (11/07 0700) Pulse Rate:  [56-72] 56 (11/07 0700) Cardiac Rhythm: Sinus bradycardia;Bundle branch block (11/07 0701) Resp:  [14-22] 18 (11/07 0700) BP: (131-157)/(48-86) 132/56 (11/07 0700) SpO2:  [97 %-100 %] 97 % (11/07 0700)  No results for input(s): GLUCAP in the last 168 hours. Recent Labs  Lab 11/19/16 1902 11/21/16 0508 11/22/16 0616  NA 137 139 138  K 4.3 4.3 4.3  CL 107 114* 113*  CO2 22 21* 22  GLUCOSE 133* 80 90  BUN 28* 21* 19  CREATININE 2.38* 2.26* 2.37*  CALCIUM 8.9 8.4* 8.3*   Recent Labs  Lab 11/19/16 1902  AST 15  ALT 10*  ALKPHOS 89  BILITOT 0.5  PROT 7.0  ALBUMIN 3.5   Recent Labs  Lab 11/19/16 1902 11/21/16 0508 11/22/16 0616  WBC 7.2 5.7 5.3  NEUTROABS 4.9  --   --   HGB 12.9* 11.1* 10.9*  HCT 40.2 35.2* 34.2*  MCV 91.6 91.2 90.2  PLT 239 157 152   No results for input(s): CKTOTAL, CKMB, CKMBINDEX, TROPONINI in the last 168 hours. Recent Labs    11/19/16 2124  LABPROT 12.9  INR 0.98   No results for input(s): COLORURINE, LABSPEC, PHURINE, GLUCOSEU, HGBUR, BILIRUBINUR, KETONESUR, PROTEINUR, UROBILINOGEN, NITRITE, LEUKOCYTESUR in the last 72 hours.  Invalid input(s): APPERANCEUR     Component Value Date/Time   CHOL 183 11/20/2016 0245   TRIG 115 11/20/2016 0245   HDL 35 (L) 11/20/2016 0245   CHOLHDL 5.2 11/20/2016 0245   VLDL 23 11/20/2016 0245   LDLCALC 125 (H) 11/20/2016 0245   Lab Results  Component Value Date   HGBA1C 5.5 11/20/2016   No results found for: LABOPIA, COCAINSCRNUR, LABBENZ, AMPHETMU, THCU, LABBARB  No results for input(s): ETH in the last 168 hours.  I have  personally reviewed the radiological images below and agree with the radiology interpretations.  Ct Head Wo Contrast 11/19/2016 IMPRESSION: 1. Trace LEFT frontal acute to subacute subarachnoid hemorrhage. Recommend MRI of the brain on nonemergent basis. 2. Otherwise negative CT HEAD for age. 3. New 2.5 cm soft tissue mass LEFT frontal scalp concerning for skin cancer. Recommend dermatologic consultation on a nonemergent basis.    Mri and Mra Brain Wo Contrast 11/20/2016 IMPRESSION: MRI HEAD: 1. Multifocal acute small frontal/ LEFT MCA territory nonhemorrhagic infarct. 2. 3 x 2.7 x 1.4 cm solid frontal scalp mass consistent with neoplasm, no calvarial invasion. Recommend non emergent dermatologic consultation. 3. No MR correlate for suspected LEFT frontal lobe subarachnoid hemorrhage ; please note MRI is frequently insensitive to hyperacute subarachnoid hemorrhage. 4. Recommend CTA HEAD and neck for further characterization. MRA HEAD: 1. Occluded LEFT internal carotid artery with thready reconstitution LEFT supraclinoid segment. 2. Slow flow LEFT middle cerebral artery, occluded LEFT M2 segment. 3. Amorphous LEFT A1-2 junction, this could reflect small aneurysm, fenestration or turbulent retrograde flow. 4. Moderate stenosis RIGHT supraclinoid internal carotid artery. Suspected severe stenosis RIGHT A1 origin. 5. Recommend CTA HEAD and neck for further characterization.   Carotid Doppler  Right 60-79% mid internal carotid artery stenosis, left internal carotid artery appears occluded. Bilateral vertebral arteries appear patent and antegrade.  2D  Echocardiogram   Impressions: - Normal LV size with EF 55-60%. Moderate diastolic dysfunction.   Normal RV size and systolic function. Mild aortic insufficiency.  LE venous doppler  No evidence of deep vein thrombosis or baker's cysts bilaterally.  Ct Head Wo Contrast 11/21/2016 IMPRESSION: 1. No subarachnoid hemorrhage on today's CT. 2. Evolving  nonhemorrhagic small LEFT frontal/MCA territory infarct. 3. 2.6 cm soft tissue mass LEFT frontal scalp, recommend nonemergent dermal a Hologic consultation.   PHYSICAL EXAM  Temp:  [98.4 F (36.9 C)-98.8 F (37.1 C)] 98.5 F (36.9 C) (11/07 0700) Pulse Rate:  [56-72] 56 (11/07 0700) Resp:  [14-22] 18 (11/07 0700) BP: (131-157)/(48-86) 132/56 (11/07 0700) SpO2:  [97 %-100 %] 97 % (11/07 0700)  General - Well nourished, well developed, in no apparent distress. Respiratory - Lung clear bilaterally. No wheezing Cardiovascular - Regular rate and rhythm with no murmur.  Mental Status -  Level of arousal and orientation to time, place, and person were intact. Language including naming, repetition, comprehension was assessed and found intact, mild dysarthria - improving Fund of Knowledge was assessed and was intact.  Cranial Nerves II - XII - II - Visual field intact OU. III, IV, VI - Extraocular movements intact. V - Facial sensation intact bilaterally. VII - facial symmetric now. VIII - Hearing & vestibular intact bilaterally. X - Palate elevates symmetrically. XI - Chin turning & shoulder shrug intact bilaterally. XII - Tongue protrusion intact.  Motor Strength - The patient's strength was normal in LUE and LLE, however, RUE 5-/5 with slight pronator drift and RLE proximal 5/5 and distal 4/5.  Bulk was normal and fasciculations were absent.   Motor Tone - Muscle tone was assessed at the neck and appendages and was normal.  Reflexes - The patient's reflexes were symmetrical in all extremities and he had no pathological reflexes.  Sensory - Light touch, temperature/pinprick were assessed and were symmetrical.    Coordination - The patient had normal movements in the left hand with no ataxia or dysmetria.  Tremor was absent.  Gait and Station - deferred.   ASSESSMENT/PLAN Keith [name] is a 76 y.o. male with no clear history with regular PCP follow up admitted for right  side weakness and slurry speech. No tPA given due to out of window and question of SAH.    11/22/16: Pt was scheduled for Cerebral angiogram today - cancelled due to emergency case. Rescheduled for tomorrow 11/23/16. Patients symptoms continue to improve slowly. Continues to complain of hoarseness and Right sided weakness. Plan for CIR in AM after procedure if stable.  Stroke:  left MCA small infarct, embolic likely secondary to large vessel disease source of left ICA occlusion  Resultant dysarthria and left hemiparesis  MRI  Left MCA infarct  MRA  Left ICA occlusion  Carotid Doppler  Left ICA occlusion and right ICA 60-79% stenosis  Repeat CT head showed resolution of trace SAH  We will plan for cerebral angio in AM 11/23/16  2D Echo - EF 55-60%  LE venous doppler - negative  LDL 125  HgbA1c 5.5  SCDs for VTE prophylaxis Diet NPO time specified Except for: Sips with Meds  Diet heart healthy/carb modified Room service appropriate? Yes; Fluid consistency: Thin   No antithrombotic prior to admission, now on aspirin 81 mg  Patient counseled to be compliant with his antithrombotic medications  Ongoing aggressive stroke risk factor management  Therapy recommendations:  CIR  Disposition:  CIR, hopefully in AM after Angio  ?  SAH  Initial CT concerns trace SAH left high convexity  MRI did not show SAH  Repeat CT 11/21/16 showed resolution of SAH  Carotid artery disease  Pt not following with doctors, no previous image to compare  CUS and MRA confirmed left ICA occlusion  CUS right ICA 60-79% stenosis  Likely atherosclerosis etiology, less likely embolic  On aspirin 81mg   Plan for cerebral angiogram tomorrow, today's case cancelled due to emergent case  Hypertension Stable Permissive hypertension (OK if <220/120) for 24-48 hours post stroke and then gradually normalized within 5-7 days.  Long term BP goal 130-150 due to carotid occlusion/stenosis  Continue IV  fluid until Angio  Hyperlipidemia  Home meds:  none   LDL 125, goal < 70  Now on lipitor 40  Continue statin at discharge  Other Stroke Risk Factors  Advanced age  Other Active Problems  Pt interested in STROKE AF trial - Research nurse at bedside today to discuss trial  Not seeing doctors for years  Frontal scalp solid large nodule -outpatient dermatology follow-up  Hospital day # [place], [place] Stroke Neurology 11/22/2016 2:11 PM  I reviewed above note and agree with the assessment and plan. I have made any additions or clarifications directly to the above note. Pt was seen and examined. No neuro changes and neuro continue to improve. Pending DSA and CIR. Pt intrested in STROKE AF trial.  Keith Hawking, MD PhD Stroke Neurology 11/22/2016 9:35 PM     To contact Stroke Continuity provider, please refer to [email or web address] After hours, contact General Neurology

## 2016-11-22 NOTE — Progress Notes (Signed)
Physical Therapy Treatment Patient Details Name: Keith [name] MRN: [id number] DOB: [birth year] Today's Date: 11/22/2016    History of Present Illness 76 y.o. male with medical history significant of Hypothyroidism.  Patient admitted with c/o R sided weakness with speech deficits in the ED. Lt MCA CVA.  Also shows a mass on top of skull that is worrisome for malignancy. Head CT 11/6-Evolving nonhemorrhagic small LEFT frontal/MCA territory infarct. Plan for angiogram 11/7.    PT Comments    Patient progressing well towards PT goals.  Scored 15/24 on DGI indicating high fall risk. Tolerated gait training without use of RW and tolerated higher level balance challenges but required Min-Mod A due to deviations in gait and LOB. Able to perform backwards walking, side stepping and changes in gait speed with external support. Difficulty with tandem stance with RLE leading. Continues to report difficulties when performing fine motor tasks with right hand. Plans to d/c to CIR after procedure today. Great CIR candidate. Will follow.  Follow Up Recommendations  CIR     Equipment Recommendations  None recommended by PT    Recommendations for Other Services       Precautions / Restrictions Precautions Precautions: Fall Restrictions Weight Bearing Restrictions: No    Mobility  Bed Mobility Overal bed mobility: Modified Independent Bed Mobility: Supine to Sit;Sit to Supine     Supine to sit: Modified independent (Device/Increase time) Sit to supine: Modified independent (Device/Increase time)   General bed mobility comments: No assist needed. No dizziness sitting EOB.  Transfers Overall transfer level: Needs assistance Equipment used: None Transfers: Sit to/from Stand Sit to Stand: Min guard         General transfer comment: Min guard for safety due to mild unsteadiness in standing.   Ambulation/Gait Ambulation/Gait assistance: Min assist;Mod assist Ambulation Distance (Feet):  200 Feet Assistive device: None Gait Pattern/deviations: Step-through pattern;Decreased stride length;Trunk flexed;Decreased step length - right;Decreased step length - left;Narrow base of support Gait velocity: decreased Gait velocity interpretation: Below normal speed for age/gender General Gait Details: Pt with short shuffling like steps with decreased stance time RLE. Right knee instability noted but no buckling. Tolerated higher level balance challenges. See balance section for details.   Stairs            Wheelchair Mobility    Modified Rankin (Stroke Patients Only) Modified Rankin (Stroke Patients Only) Pre-Morbid Rankin Score: No symptoms Modified Rankin: Moderately severe disability     Balance Overall balance assessment: Needs assistance Sitting-balance support: Feet supported;No upper extremity supported Sitting balance-[name] Scale: Good     Standing balance support: During functional activity Standing balance-[name] Scale: Fair Standing balance comment: Able to perform static standing without UE support but requires UE support for dynamic standing.      Tandem Stance - Right Leg: 5 Tandem Stance - Left Leg: 20 [place] - Eyes Opened: 30 Rh[place]berg - Eyes Closed: 30 High level balance activites: Side stepping;Backward walking;Direction changes;Sudden stops;Head turns;Turns High Level Balance Comments: Tolerated above activities with Min-mod A for balance esp with backward walking, head turns and side stepping. pt with gait deviations when performing activities requiring external support.  Standardized Balance Assessment Standardized Balance Assessment : Dynamic Gait Index   Dynamic Gait Index Level Surface: Mild Impairment Change in Gait Speed: Mild Impairment Gait with Horizontal Head Turns: Mild Impairment Gait with Vertical Head Turns: Mild Impairment Gait and Pivot Turn: Mild Impairment Step Over Obstacle: Moderate Impairment Step Around Obstacles:  Normal Steps: Moderate Impairment Total Score: 15  Cognition Arousal/Alertness: Awake/alert Behavior During Therapy: WFL for tasks assessed/performed Overall Cognitive Status: Within Functional Limits for tasks assessed                                        Exercises      General Comments General comments (skin integrity, edema, etc.): Son and granddaughter present during session.      Pertinent Vitals/Pain Pain Assessment: No/denies pain    Home Living                      Prior Function            PT Goals (current goals can now be found in the care plan section) Progress towards PT goals: Progressing toward goals    Frequency    Min 4X/week      PT Plan Current plan remains appropriate    Co-evaluation              AM-PAC PT "6 Clicks" Daily Activity  Outcome Measure  Difficulty turning over in bed (including adjusting bedclothes, sheets and blankets)?: None Difficulty moving from lying on back to sitting on the side of the bed? : None Difficulty sitting down on and standing up from a chair with arms (e.g., wheelchair, bedside commode, etc,.)?: None Help needed moving to and from a bed to chair (including a wheelchair)?: A [place] Help needed walking in hospital room?: A [place] Help needed climbing 3-5 steps with a railing? : A Lot 6 Click Score: 20    End of Session Equipment Utilized During Treatment: Gait belt Activity Tolerance: Patient tolerated treatment well Patient left: in bed;with call bell/phone within reach;with family/visitor present;with SCD's reapplied Nurse Communication: Mobility status PT Visit Diagnosis: Unsteadiness on feet (R26.81);Other abnormalities of gait and mobility (R26.89);Hemiplegia and hemiparesis Hemiplegia - Right/Left: Right Hemiplegia - dominant/non-dominant: Dominant Hemiplegia - caused by: Cerebral infarction     Time: 1247-1310 PT Time Calculation (min) (ACUTE ONLY): 23  min  Charges:  $Gait Training: 8-22 mins $Neuromuscular Re-education: 8-22 mins                    G Codes:       Wray Kearns, PT, DPT [phone]     Marguarite Arbour A Dunya Meiners 11/22/2016, 1:16 PM

## 2016-11-22 NOTE — Care Management Note (Signed)
Case Management Note  Patient Details  Name: Keith [name] MRN: [id number] Date of Birth: 1940/05/27  Subjective/Objective:   Pt in with [facility]. He is from home alone.                    Action/Plan: Plan was for cerebral angio today but rescheduled for am. PT/OT recommending CIR. Plan is for patient to d/c to CIR once has had cerebral angio.  Son inquired about adding to his fathers Medicare. Pt only has Medicare A. CM provided the son with the [employer] assistance number. Son to call and follow up. CM following.  Expected Discharge Date:                  Expected Discharge Plan:  [place]  In-House Referral:     Discharge planning Services  CM Consult  Post Acute Care Choice:    Choice offered to:     DME Arranged:    DME Agency:     HH Arranged:    [place] Agency:     Status of Service:  In process, will continue to follow  If discussed at Long Length of Stay Meetings, dates discussed:    Additional Comments:  Pollie Friar, RN 11/22/2016, 2:08 PM

## 2016-11-22 NOTE — Progress Notes (Signed)
  Speech Language Pathology Treatment: Cognitive-Linquistic  Patient Details Name: DAMEAN POFFENBERGER MRN: [id number] DOB: [birth year] Today's Date: 11/22/2016 Time: 1194-1740 SLP Time Calculation (min) (ACUTE ONLY): 18 min  Assessment / Plan / Recommendation Clinical Impression  Pt's expressive language is fluent, although he does still have mild-moderate difficulty with more complex linguistic tasks such as divergent naming. SLP provided Min cues for speech intelligibility strategies as pt recited his own poetry. PO trials were deferred as pt was NPO pending possible testing, which has since been postponed. Continue to recommend CIR.   HPI HPI: 76 y.o. male with medical history significant of Hypothyroidism. Patient admitted with c/o R sided weakness with speech deficits in the ED. CT head shows very small, trace SAH in Lt frontal region. MRI shows multifocal acute small frontal/L MCA territory infarcts.Imaging also shows a mass on top of skull that is worrisome for malignancy.      SLP Plan  Continue with current plan of care       Recommendations  Diet recommendations: Dysphagia 3 (mechanical soft);Thin liquid Liquids provided via: Cup;Straw Medication Administration: Whole meds with puree Supervision: Patient able to self feed;Intermittent supervision to cue for compensatory strategies Compensations: Slow rate;Small sips/bites;Follow solids with liquid Postural Changes and/or Swallow Maneuvers: Seated upright 90 degrees;Upright 30-60 min after meal                Oral Care Recommendations: Oral care BID Follow up Recommendations: Inpatient Rehab SLP Visit Diagnosis: Dysarthria and anarthria (R47.1) Plan: Continue with current plan of care       GO                Germain Osgood 11/22/2016, 3:04 PM  Germain Osgood, M.A. CCC-SLP [phone]

## 2016-11-22 NOTE — Progress Notes (Signed)
Inpatient Rehabilitation  Note that angio has been postponed until tomorrow.  Plan to follow up after procedure tomorrow to determine readiness prior to hopeful IP Rehab admission.  Call if questions.   Carmelia Roller., CCC/SLP Admission Coordinator  [place]  Cell [phone]

## 2016-11-22 NOTE — Research (Signed)
STROKE-AF research study reviewed again with patient and family. Questions encouraged and answered. Patient does want to participate but wants to wait until the "stenting procedure is over and other testing". Patient request that I return again tomorrow.

## 2016-11-22 NOTE — PMR Pre-admission (Signed)
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 Date: 09/17/06     Deduct: $1340      Out of Pocket Max: N/A      Life Max: N/A CIR: Facility charges covered; Physician services, DME, and Outpatient therapy not covered due to no Medicare Part B coverage          SNF: Facility charges covered days 1-20; covered 80% days 21-100 Outpatient: No coverage     Co-Pay:  Home Health: 100%      Co-Pay: none DME: No coverage     Co-Pay:  Providers: Patient's choice   SECONDARY: None      Policy#:       Subscriber:  CM Name:       Phone#:      Fax#:  Pre-Cert#:       Employer:  Benefits:  Phone #:      Name:  Eff. Date:      Deduct:       Out of Pocket Max:       Life Max:  CIR:       SNF:  Outpatient:      Co-Pay:  Home Health:       Co-Pay:  DME:      Co-Pay:   Medicaid Application Date:       Case Manager:  Disability Application Date:       Case Worker:   Emergency Contact Information Contact Information    Name Relation Home Work [place] Other [place] Son [phone]  [phone]     Current Medical History  Patient Admitting Diagnosis: Multifocal stroke  History of Present Illness: Keith A Littleis a 76 y.o.right handed malewith documented history of hypothyroidism, CKDstage III,as well as a 2.5 cm mass at the top of the skull that he has had for a number of years and monitored. Patient on no prescription medications at time of admission. Per chart review patient lives alone. Independent  working as a Presenter, broadcasting for [employer]. One level home.He has a son in the area that can arrange assistance as needed.Presented 11/19/2016 with headache and some incoordination of the right side.Patient denied any trauma or fall.Noted creatinine 2.38 from latest baseline of 3.20. TSH 5.170. Troponin negative. CT of the head reviewed, showing left SAH. Per report, trace left frontal acute to subacute subarachnoid hemorrhage. MRI showed multifocal acute small frontal left MCA territory nonhemorrhagic infarction. 2.3 x 2.7 x 1.4 cm solid frontal scalp mass consistent with neoplasm. Carotid Doppler was 60-79% mid internal carotid artery stenosis, left internal carotid artery appeared occluded MRA showed occluded left internal carotid artery with thready reconstitution left supraclinoid segment. Moderate stenosis right supraclinoid internal carotid artery. Lower extremity Dopplers negative  for DVT.  Echocardiogram with ejection fraction of 48% grade 2 diastolic dysfunction. Neurology consulted and continue to follow with repeat cranial CT scan 11/20/2016 showing no subarachnoid hemorrhage on follow-up scan as well as evolving nonhemorrhagic small left frontal MCA territory infarct and a 2.6 cm soft tissue mass left frontal scalp. Patient advised to follow-up outpatient with dermatology for evaluation of frontal scalp solid large nodule. Cerebral arteriogram later completed 11/23/2016 showing approximately 45% stenosis right ICA proximal. Occluded left ICA proximal with partial distal reconstitution from ipsilateral OA. Maintained on aspirin for CVA prophylaxis. Currently on dysphagia #3 thin liquid diet. Physical and occupational therapy evaluations completed with recommendations of physical medicine rehabilitation consult. Patient was admitted for a comprehensive rehabilitation program 11/23/16.   NIH Total: 2    Past Medical History  Past Medical History:  Diagnosis Date  . Hypothyroidism   .  Obstructive uropathy     Family History  family history includes Lung cancer in his father.  Prior Rehab/Hospitalizations:  Has the patient had major surgery during 100 days prior to admission? No  Current Medications   Current Facility-Administered Medications:  .  0.9 %  sodium chloride infusion, , Intravenous, Continuous, Rosalin Hawking, MD, Last Rate: 75 mL/hr at 11/22/16 2024, 1,000 mL at 11/22/16 2024 .  acetaminophen (TYLENOL) tablet 650 mg, 650 mg, Oral, Q6H PRN **OR** acetaminophen (TYLENOL) suppository 650 mg, 650 mg, Rectal, Q6H PRN, Etta Quill, DO .  aspirin EC tablet 81 mg, 81 mg, Oral, Daily, Rosalin Hawking, MD, 81 mg at 11/23/16 1036 .  atorvastatin (LIPITOR) tablet 40 mg, 40 mg, Oral, q1800, Rosalin Hawking, MD, 40 mg at 11/22/16 1710 .  lidocaine (XYLOCAINE) 1 % (with pres) injection, , , ,  .  ondansetron (ZOFRAN) tablet 4 mg, 4 mg, Oral, Q6H PRN **OR** ondansetron (ZOFRAN) injection 4 mg, 4 mg, Intravenous, Q6H PRN, Alcario Drought, Jared M, DO .  senna-docusate (Senokot-S) tablet 1 tablet, 1 tablet, Oral, QHS PRN, Kerney Elbe, MD .  sodium phosphate (FLEET) 7-19 GM/118ML enema 1 enema, 1 enema, Rectal, Daily PRN, Kerney Elbe, MD  Patients Current Diet: Diet heart healthy/carb modified Room service appropriate? 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  He stopped driving 6 months ago due to vision, but lived alone and was fully independent.   Home Assistive Devices / Equipment Home Assistive Devices/Equipment: None Home Equipment: None  Prior Device Use: Indicate devices/aids used by the patient prior to current illness, exacerbation or injury? None of the above  Prior Functional  Level Prior Function Level of Independence: Independent Comments: Independent with ADLs, IADLs, driving, and has worked for [employer] for [place]: Did the patient need help bathing, dressing, using the toilet or eating? Independent  Indoor Mobility: Did the patient need assistance with walking from room to room (with or without device)? Independent  Stairs: Did the patient need assistance with internal or external stairs (with or without device)? Independent  Functional Cognition: Did the patient need help planning regular tasks such as shopping or remembering to take medications? Independent  Current Functional Level Cognition  Overall Cognitive Status: Within Functional Limits for tasks assessed Orientation Level: Oriented X4 Following Commands: Follows one step commands with increased time Safety/Judgement: Decreased awareness  of deficits General Comments: pt with increased awareness of R UE deficits this session     Extremity Assessment (includes Sensation/Coordination)  Upper Extremity Assessment: RUE deficits/detail RUE Deficits / Details: Pt with decreased strength and coorindation of RUE. Pt with poor opposition, grasp strength, and proproception. Pt able to bring RUE to mouth and top of head with Max cues.  RUE Sensation: decreased light touch RUE Coordination: decreased fine motor, decreased gross motor  Lower Extremity Assessment: Defer to PT evaluation RLE Deficits / Details: 3/5     ADLs  Overall ADL's : Needs assistance/impaired Eating/Feeding: Minimal assistance, Sitting Eating/Feeding Details (indicate cue type and reason): supported sitting Grooming: Minimal assistance, Sitting Grooming Details (indicate cue type and reason): pt requiring Min A to correct postural control and balance. Pt requiring Min A for bilateral tasks and needs increased assistance to incorporate RUE Upper Body Bathing: Moderate assistance, Sitting Lower Body Bathing:  Maximal assistance, +2 for physical assistance, Sit to/from stand Upper Body Dressing : Moderate assistance, Sitting Lower Body Dressing: Minimal assistance Lower Body Dressing Details (indicate cue type and reason): pt needing min A for balance while donning B socks Toilet Transfer: Maximal assistance, +2 for physical assistance, Ambulation(Simulated in room) Functional mobility during ADLs: Maximal assistance, +2 for physical assistance, Cueing for sequencing, Cueing for safety General ADL Comments: Pt demonstrating decreased functional performance. Presenting with decreased functional use of R (dominant) hand, inattention to his R side, decreased cognition, and poor balance    Mobility  Overal bed mobility: Modified Independent Bed Mobility: Supine to Sit, Sit to Supine Supine to sit: Modified independent (Device/Increase time) Sit to supine: Modified independent (Device/Increase time) General bed mobility comments: No assist needed. No dizziness sitting EOB.    Transfers  Overall transfer level: Needs assistance Equipment used: None Transfers: Sit to/from Stand Sit to Stand: Min guard General transfer comment: Min guard for safety due to mild unsteadiness in standing.     Ambulation / Gait / Stairs / Wheelchair Mobility  Ambulation/Gait Ambulation/Gait assistance: Min assist, Mod assist Ambulation Distance (Feet): 200 Feet Assistive device: None Gait Pattern/deviations: Step-through pattern, Decreased stride length, Trunk flexed, Decreased step length - right, Decreased step length - left, Narrow base of support General Gait Details: Pt with short shuffling like steps with decreased stance time RLE. Right knee instability noted but no buckling. Tolerated higher level balance challenges. See balance section for details. Gait velocity: decreased Gait velocity interpretation: Below normal speed for age/gender    Posture / Balance Dynamic Sitting Balance Sitting balance - Comments:  pt able to sit 4 min EOB with minguard and max cues for posture and position Static Standing Balance Single Leg Stance - Right Leg: 1 Single Leg Stance - Left Leg: 1 Tandem Stance - Right Leg: 5 Tandem Stance - Left Leg: 20 [place] - Eyes Opened: 30 [place] - Eyes Closed: 30 Balance Overall balance assessment: Needs assistance Sitting-balance support: Feet supported, No upper extremity supported Sitting balance-[name] Scale: Good Sitting balance - Comments: pt able to sit 4 min EOB with minguard and max cues for posture and position Postural control: Right lateral lean, Posterior lean Standing balance support: During functional activity Standing balance-[name] Scale: Fair Standing balance comment: Able to perform static standing without UE support but requires UE support for dynamic standing.  Single Leg Stance - Right Leg: 1 Single Leg Stance - Left Leg: 1 Tandem Stance - Right Leg: 5 Tandem Stance - Left Leg: 20 Rhomberg - Eyes Opened: 30 [place] - Eyes  Closed: 30 High level balance activites: Side stepping, Backward walking, Direction changes, Sudden stops, Head turns, Turns High Level Balance Comments: Tolerated above activities with Min-mod A for balance esp with backward walking, head turns and side stepping. pt with gait deviations when performing activities requiring external support.  Standardized Balance Assessment Standardized Balance Assessment : Dynamic Gait Index Berg Balance Test Sit to Stand: Able to stand without using hands and stabilize independently Standing Unsupported: Able to stand 2 minutes with supervision Sitting with Back Unsupported but Feet Supported on Floor or Stool: Able to sit safely and securely 2 minutes Stand to Sit: Sits safely with minimal use of hands Transfers: Able to transfer safely, minor use of hands Standing Unsupported with Eyes Closed: Able to stand 10 seconds safely Standing Ubsupported with Feet Together: Able to place feet together  independently and stand 1 minute safely From Standing, Reach Forward with Outstretched Arm: Can reach confidently >25 cm (10") From Standing Position, Pick up Object from Floor: Able to pick up shoe safely and easily From Standing Position, Turn to Look Behind Over each Shoulder: Turn sideways only but maintains balance Turn 360 Degrees: Needs close supervision or verbal cueing Standing Unsupported, Alternately Place Feet on Step/Stool: Needs assistance to keep from falling or unable to try Standing Unsupported, One Foot in Front: Loses balance while stepping or standing Standing on One Leg: Unable to try or needs assist to prevent fall Total Score: 38 Dynamic Gait Index Level Surface: Mild Impairment Change in Gait Speed: Mild Impairment Gait with Horizontal Head Turns: Mild Impairment Gait with Vertical Head Turns: Mild Impairment Gait and Pivot Turn: Mild Impairment Step Over Obstacle: Moderate Impairment Step Around Obstacles: Normal Steps: Moderate Impairment Total Score: 15    Special needs/care consideration BiPAP/CPAP: No CPM: No Continuous Drip IV: No Dialysis: No         Life Vest: No Oxygen: No Special Bed: No Trach Size: No Wound Vac (area): No       Skin: Dry and intact                               Bowel mgmt: Continent, last BM 11/21/16 Bladder mgmt: Continent  Diabetic mgmt: HgbA1c 5.5        Previous Home Environment Living Arrangements: Alone Available Help at Discharge: Family, Available 24 hours/day Type of Home: House Home Layout: One level Home Access: Stairs to enter [employer] of Steps: 1 - at pt's house, level entry at [employer] Shower/Tub: Chiropodist: [place]: No  Discharge Living Setting Plans for Discharge Living Setting: Patient's home, Alone Type of Home at Discharge: House Discharge Home Layout: One level Discharge Home Access: Stairs to enter Entrance Stairs-Rails:  None Entrance Stairs-Number of Steps: 1 Discharge Bathroom Shower/Tub: Tub/shower unit, Curtain Discharge Bathroom Toilet: Standard Discharge Bathroom Accessibility: Yes How Accessible: Accessible via walker Does the patient have any problems obtaining your medications?: No  Social/Family/Support Systems Patient Roles: Parent, Other (Comment)(Grandparent ) Contact Information: Son: Peder Allums [phone] Anticipated Caregiver: Son and family  Anticipated Caregiver's Contact Information: see above  Ability/Limitations of Caregiver: among them they can provide 24/7 Caregiver Availability: 24/7 Discharge Plan Discussed with Primary Caregiver: Yes Is Caregiver In Agreement with Plan?: Yes Does Caregiver/Family have Issues with Lodging/Transportation while Pt is in Rehab?: No  Goals/Additional Needs Patient/Family Goal for Rehab: PT/OT Min A; SLP Mod I  Expected length of stay: 16-19 days  Cultural Considerations: None Dietary Needs: Heart Healthy diet restrictions, Dys.3 textures currently recommended   Equipment Needs: TBD Pt/Family Agrees to Admission and willing to participate: Yes Program Orientation Provided & Reviewed with Pt/Caregiver Including Roles  & Responsibilities: Yes  Barriers to Discharge: Other (comments)(new medications and diet restrictions, needs education )  Decrease burden of Care through IP rehab admission: No  Possible need for SNF placement upon discharge: No  Patient Condition: This patient's medical and functional status has changed since the consult dated: 11/20/16 at 12:35pm in which the Rehabilitation Physician determined and documented that the patient's condition is appropriate for intensive rehabilitative care in an inpatient rehabilitation facility. See "History of Present Illness" (above) for medical update. Functional changes are: Min A transfers and Min-Mod A gait. Patient's medical and functional status update has been discussed with the  Rehabilitation physician and patient remains appropriate for inpatient rehabilitation. Will admit to inpatient rehab today.  Preadmission Screen Completed By:  Gunnar Fusi, 11/23/2016 12:02 PM ______________________________________________________________________   Discussed status with Dr. Letta Pate on 11/23/16 at 12:03pm and received telephone approval for admission today.  Admission Coordinator:  Gunnar Fusi, time 12:03pm/Date 11/23/16

## 2016-11-22 NOTE — Progress Notes (Signed)
Occupational Therapy Treatment Patient Details Name: ULICES MAACK MRN: [id number] DOB: 18-Oct-1940 Today's Date: 11/22/2016    History of present illness 76 y.o. male with medical history significant of Hypothyroidism.  Patient admitted with c/o R sided weakness with speech deficits in the ED. Lt MCA CVA.  Also shows a mass on top of skull that is worrisome for malignancy.   OT comments  Pt performed bed mobility with increased time to EOB. Pt performed R UE [place] exercises with difficulty isolating movements for digits 3-5 without assistance. Pt ambulating in room with steady assistance without use of RW. PT required cues for weight shift to L while in standing and forward gaze. Pt ambulating 100' without AD with overall steady assistance for balance with pt looking L <> R to locate items and return to room.  No significant LOB this session. Pt continues to benefit from OT intervention.   Follow Up Recommendations  CIR    Equipment Recommendations  Other (comment)(defer to next venue of care)    Recommendations for Other Services Rehab consult    Precautions / Restrictions Precautions Precautions: Fall Restrictions Weight Bearing Restrictions: No       Mobility Bed Mobility Overal bed mobility: Modified Independent       Transfers Overall transfer level: Needs assistance   Transfers: Sit to/from Stand Sit to Stand: Min guard         General transfer comment: sit <>stand with min guard for balance and safety    Balance Overall balance assessment: Needs assistance Sitting-balance support: No upper extremity supported;Feet supported Sitting balance-[name] Scale: Good       Standing balance-[name] Scale: Fair Standing balance comment: steady assistance for balance        ADL either performed or assessed with clinical judgement   ADL Overall ADL's : Needs assistance/impaired       Lower Body Dressing: Minimal assistance Lower Body Dressing Details (indicate cue  type and reason): pt needing min A for balance while donning B socks                   Cognition Arousal/Alertness: Awake/alert Behavior During Therapy: WFL for tasks assessed/performed Overall Cognitive Status: Within Functional Limits for tasks assessed      General Comments: pt with increased awareness of R UE deficits this session               General Comments son present this session.     Pertinent Vitals/ Pain       Pain Assessment: No/denies pain  Home Living   Prior Functioning/Environment    Frequency  Min 3X/week        Progress Toward Goals  OT Goals(current goals can now be found in the care plan section)  Progress towards OT goals: Progressing toward goals  Acute Rehab OT Goals Patient Stated Goal: return home OT Goal Formulation: With patient/family Time For Goal Achievement: 12/06/16 Potential to Achieve Goals: Good  Plan Discharge plan remains appropriate       AM-PAC PT "6 Clicks" Daily Activity     Outcome Measure   Help from another person eating meals?: A [place] Help from another person taking care of personal grooming?: A [place] Help from another person toileting, which includes using toliet, bedpan, or urinal?: A [place] Help from another person bathing (including washing, rinsing, drying)?: A [place] Help from another person to put on and taking off regular upper body clothing?: A [place] Help from another person to put on and  taking off regular lower body clothing?: A Lot 6 Click Score: 17    End of Session    OT Visit Diagnosis: Unsteadiness on feet (R26.81);Other abnormalities of gait and mobility (R26.89);Other symptoms and signs involving cognitive function;Muscle weakness (generalized) (M62.81);Hemiplegia and hemiparesis Hemiplegia - Right/Left: Right Hemiplegia - dominant/non-dominant: Dominant Hemiplegia - caused by: Cerebral infarction   Activity Tolerance Treatment limited secondary to medical complications (Comment)    Patient Left in bed;with call bell/phone within reach;with bed alarm set;with family/visitor present   Nurse Communication          Time: 2800-3491 OT Time Calculation (min): 23 min  Charges: OT General Charges $OT Visit: 1 Visit OT Treatments $Therapeutic Activity: 23-37 mins    Gypsy Decant 11/22/2016, 8:40 AM

## 2016-11-22 NOTE — Progress Notes (Signed)
Unable to proceed with cerebral angiogram today due to emergency case.  We will plan to proceed tomorrow 11/8 as able.  He may eat today and NPO p MN tonight.  Brendt Dible E 1:56 PM 11/22/2016

## 2016-11-23 ENCOUNTER — Encounter (HOSPITAL_COMMUNITY)
Admission: EM | Disposition: A | Discharge status: Rehabilitation Facility | Payer: Self-pay | Source: Home / Self Care | Attending: Neurology

## 2016-11-23 ENCOUNTER — Inpatient Hospital Stay (HOSPITAL_COMMUNITY): Payer: Medicare Other

## 2016-11-23 ENCOUNTER — Encounter (HOSPITAL_COMMUNITY): Payer: Self-pay | Admitting: *Deleted

## 2016-11-23 ENCOUNTER — Inpatient Hospital Stay (HOSPITAL_COMMUNITY)
Admission: RE | Admit: 2016-11-23 | Discharge: 2016-11-30 | DRG: 057 | Disposition: A | Discharge status: Home / Self Care | Payer: Medicare Other | Source: Intra-hospital | Attending: Physical Medicine & Rehabilitation | Admitting: Physical Medicine & Rehabilitation

## 2016-11-23 DIAGNOSIS — E039 Hypothyroidism, unspecified: Secondary | ICD-10-CM | POA: Diagnosis present

## 2016-11-23 DIAGNOSIS — E785 Hyperlipidemia, unspecified: Secondary | ICD-10-CM | POA: Diagnosis present

## 2016-11-23 DIAGNOSIS — I63512 Cerebral infarction due to unspecified occlusion or stenosis of left middle cerebral artery: Secondary | ICD-10-CM

## 2016-11-23 DIAGNOSIS — R1310 Dysphagia, unspecified: Secondary | ICD-10-CM | POA: Diagnosis present

## 2016-11-23 DIAGNOSIS — Z801 Family history of malignant neoplasm of trachea, bronchus and lung: Secondary | ICD-10-CM | POA: Diagnosis not present

## 2016-11-23 DIAGNOSIS — R269 Unspecified abnormalities of gait and mobility: Secondary | ICD-10-CM

## 2016-11-23 DIAGNOSIS — D1801 Hemangioma of skin and subcutaneous tissue: Secondary | ICD-10-CM | POA: Diagnosis present

## 2016-11-23 DIAGNOSIS — N183 Chronic kidney disease, stage 3 (moderate): Secondary | ICD-10-CM | POA: Diagnosis present

## 2016-11-23 DIAGNOSIS — I6522 Occlusion and stenosis of left carotid artery: Secondary | ICD-10-CM | POA: Diagnosis present

## 2016-11-23 DIAGNOSIS — I69351 Hemiplegia and hemiparesis following cerebral infarction affecting right dominant side: Secondary | ICD-10-CM | POA: Diagnosis not present

## 2016-11-23 DIAGNOSIS — R220 Localized swelling, mass and lump, head: Secondary | ICD-10-CM | POA: Diagnosis present

## 2016-11-23 DIAGNOSIS — I6389 Other cerebral infarction: Secondary | ICD-10-CM

## 2016-11-23 DIAGNOSIS — I639 Cerebral infarction, unspecified: Secondary | ICD-10-CM

## 2016-11-23 DIAGNOSIS — I69398 Other sequelae of cerebral infarction: Secondary | ICD-10-CM

## 2016-11-23 HISTORY — PX: LOOP RECORDER INSERTION: EP1214

## 2016-11-23 HISTORY — PX: IR ANGIO VERTEBRAL SEL VERTEBRAL UNI L MOD SED: IMG5367

## 2016-11-23 HISTORY — PX: IR ANGIO INTRA EXTRACRAN SEL COM CAROTID INNOMINATE BILAT MOD SED: IMG5360

## 2016-11-23 HISTORY — DX: Cerebral infarction due to unspecified occlusion or stenosis of left middle cerebral artery: I63.512

## 2016-11-23 LAB — COMPREHENSIVE METABOLIC PANEL
ALT: 9 U/L — ABNORMAL LOW (ref 17–63)
AST: 16 U/L (ref 15–41)
Albumin: 2.9 g/dL — ABNORMAL LOW (ref 3.5–5.0)
Alkaline Phosphatase: 68 U/L (ref 38–126)
Anion gap: 3 — ABNORMAL LOW (ref 5–15)
BUN: 23 mg/dL — ABNORMAL HIGH (ref 6–20)
CO2: 23 mmol/L (ref 22–32)
Calcium: 8.4 mg/dL — ABNORMAL LOW (ref 8.9–10.3)
Chloride: 111 mmol/L (ref 101–111)
Creatinine, Ser: 2.18 mg/dL — ABNORMAL HIGH (ref 0.61–1.24)
GFR calc Af Amer: 32 mL/min — ABNORMAL LOW (ref >60)
GFR calc non Af Amer: 28 mL/min — ABNORMAL LOW (ref >60)
Glucose, Bld: 104 mg/dL — ABNORMAL HIGH (ref 65–99)
Potassium: 3.9 mmol/L (ref 3.5–5.1)
Sodium: 137 mmol/L (ref 135–145)
Total Bilirubin: 0.6 mg/dL (ref 0.3–1.2)
Total Protein: 5.8 g/dL — ABNORMAL LOW (ref 6.5–8.1)

## 2016-11-23 LAB — CBC
HCT: 35.9 % — ABNORMAL LOW (ref 39.0–52.0)
Hemoglobin: 11.7 g/dL — ABNORMAL LOW (ref 13.0–17.0)
MCH: 29.7 pg (ref 26.0–34.0)
MCHC: 32.6 g/dL (ref 30.0–36.0)
MCV: 91.1 fL (ref 78.0–100.0)
Platelets: 145 10*3/uL — ABNORMAL LOW (ref 150–400)
RBC: 3.94 MIL/uL — ABNORMAL LOW (ref 4.22–5.81)
RDW: 13.2 % (ref 11.5–15.5)
WBC: 4.3 10*3/uL (ref 4.0–10.5)

## 2016-11-23 LAB — BASIC METABOLIC PANEL
Anion gap: 8 (ref 5–15)
BUN: 22 mg/dL — ABNORMAL HIGH (ref 6–20)
CO2: 12 mmol/L — ABNORMAL LOW (ref 22–32)
Calcium: 8.1 mg/dL — ABNORMAL LOW (ref 8.9–10.3)
Chloride: 117 mmol/L — ABNORMAL HIGH (ref 101–111)
Creatinine, Ser: 2.09 mg/dL — ABNORMAL HIGH (ref 0.61–1.24)
GFR calc Af Amer: 34 mL/min — ABNORMAL LOW (ref >60)
GFR calc non Af Amer: 29 mL/min — ABNORMAL LOW (ref >60)
Glucose, Bld: 70 mg/dL (ref 65–99)
Potassium: 5.9 mmol/L — ABNORMAL HIGH (ref 3.5–5.1)
Sodium: 137 mmol/L (ref 135–145)

## 2016-11-23 LAB — CBC WITH DIFFERENTIAL/PLATELET
Basophils Absolute: 0 10*3/uL (ref 0.0–0.1)
Basophils Relative: 0 %
Eosinophils Absolute: 0.2 10*3/uL (ref 0.0–0.7)
Eosinophils Relative: 3 %
HCT: 35.5 % — ABNORMAL LOW (ref 39.0–52.0)
Hemoglobin: 11.4 g/dL — ABNORMAL LOW (ref 13.0–17.0)
Lymphocytes Relative: 21 %
Lymphs Abs: 1.2 10*3/uL (ref 0.7–4.0)
MCH: 29 pg (ref 26.0–34.0)
MCHC: 32.1 g/dL (ref 30.0–36.0)
MCV: 90.3 fL (ref 78.0–100.0)
Monocytes Absolute: 0.4 10*3/uL (ref 0.1–1.0)
Monocytes Relative: 8 %
Neutro Abs: 3.7 10*3/uL (ref 1.7–7.7)
Neutrophils Relative %: 68 %
Platelets: 181 10*3/uL (ref 150–400)
RBC: 3.93 MIL/uL — ABNORMAL LOW (ref 4.22–5.81)
RDW: 13 % (ref 11.5–15.5)
WBC: 5.4 10*3/uL (ref 4.0–10.5)

## 2016-11-23 SURGERY — LOOP RECORDER INSERTION

## 2016-11-23 MED ORDER — IOPAMIDOL (ISOVUE-300) INJECTION 61%
INTRAVENOUS | Status: AC
  Administered 2016-11-23: 65 mL
  Filled 2016-11-23: qty 150

## 2016-11-23 MED ORDER — LIDOCAINE-EPINEPHRINE 1 %-1:100000 IJ SOLN
INTRAMUSCULAR | Status: DC | PRN
  Administered 2016-11-23: 10 mL via INTRADERMAL

## 2016-11-23 MED ORDER — LIDOCAINE HCL 1 % IJ SOLN
INTRAMUSCULAR | Status: AC
  Filled 2016-11-23: qty 20

## 2016-11-23 MED ORDER — FENTANYL CITRATE (PF) 100 MCG/2ML IJ SOLN
INTRAMUSCULAR | Status: AC
  Filled 2016-11-23: qty 2

## 2016-11-23 MED ORDER — HEPARIN SODIUM (PORCINE) 1000 UNIT/ML IJ SOLN
INTRAMUSCULAR | Status: AC | PRN
  Administered 2016-11-23: 1000 [IU] via INTRAVENOUS

## 2016-11-23 MED ORDER — ONDANSETRON HCL 4 MG/2ML IJ SOLN: 4.0000 mg | Freq: Four times a day (QID) | INTRAMUSCULAR | Status: DC | PRN

## 2016-11-23 MED ORDER — SORBITOL 70 % SOLN
30.0000 mL | Freq: Every day | Status: DC | PRN
  Administered 2016-11-26: 30 mL via ORAL
  Filled 2016-11-23: qty 30

## 2016-11-23 MED ORDER — ONDANSETRON HCL 4 MG PO TABS: 4.0000 mg | ORAL_TABLET | Freq: Four times a day (QID) | ORAL | Status: DC | PRN

## 2016-11-23 MED ORDER — MIDAZOLAM HCL 2 MG/2ML IJ SOLN
INTRAMUSCULAR | Status: AC
  Filled 2016-11-23: qty 2

## 2016-11-23 MED ORDER — SENNOSIDES-DOCUSATE SODIUM 8.6-50 MG PO TABS
1.0000 | ORAL_TABLET | Freq: Every evening | ORAL | Status: DC | PRN
  Filled 2016-11-23: qty 1

## 2016-11-23 MED ORDER — LIDOCAINE-EPINEPHRINE 1 %-1:100000 IJ SOLN
INTRAMUSCULAR | Status: AC
  Filled 2016-11-23: qty 1

## 2016-11-23 MED ORDER — ATORVASTATIN CALCIUM 40 MG PO TABS
40.0000 mg | ORAL_TABLET | Freq: Every day | ORAL | Status: DC
  Administered 2016-11-23 – 2016-11-29 (×7): 40 mg via ORAL
  Filled 2016-11-23 (×7): qty 1

## 2016-11-23 MED ORDER — SODIUM CHLORIDE 0.9 % IV SOLN
INTRAVENOUS | Status: AC | PRN
  Administered 2016-11-23: 250 mL via INTRAVENOUS

## 2016-11-23 MED ORDER — LIDOCAINE HCL 1 % IJ SOLN
INTRAMUSCULAR | Status: AC | PRN
  Administered 2016-11-23: 10 mL

## 2016-11-23 MED ORDER — ACETAMINOPHEN 325 MG PO TABS
650.0000 mg | ORAL_TABLET | Freq: Four times a day (QID) | ORAL | Status: DC | PRN
  Administered 2016-11-24 (×2): 650 mg via ORAL
  Filled 2016-11-23 (×2): qty 2

## 2016-11-23 MED ORDER — SODIUM CHLORIDE 0.9 % IV SOLN
INTRAVENOUS | Status: DC
  Administered 2016-11-23: 14:00:00 via INTRAVENOUS

## 2016-11-23 MED ORDER — ASPIRIN EC 81 MG PO TBEC
81.0000 mg | DELAYED_RELEASE_TABLET | Freq: Every day | ORAL | Status: DC
  Administered 2016-11-23 – 2016-11-30 (×8): 81 mg via ORAL
  Filled 2016-11-23 (×9): qty 1

## 2016-11-23 MED ORDER — MIDAZOLAM HCL 2 MG/2ML IJ SOLN
INTRAMUSCULAR | Status: AC | PRN
  Administered 2016-11-23: 1 mg via INTRAVENOUS

## 2016-11-23 MED ORDER — FENTANYL CITRATE (PF) 100 MCG/2ML IJ SOLN
INTRAMUSCULAR | Status: AC | PRN
  Administered 2016-11-23: 25 ug via INTRAVENOUS

## 2016-11-23 MED ORDER — HEPARIN SOD (PORK) LOCK FLUSH 100 UNIT/ML IV SOLN
INTRAVENOUS | Status: AC
  Filled 2016-11-23: qty 10

## 2016-11-23 MED ORDER — ACETAMINOPHEN 650 MG RE SUPP: 650.0000 mg | Freq: Four times a day (QID) | RECTAL | Status: DC | PRN

## 2016-11-23 SURGICAL SUPPLY — 2 items
LOOP REVEAL LINQSYS (Prosthesis & Implant Heart) ×2 IMPLANT
PACK LOOP INSERTION (CUSTOM PROCEDURE TRAY) ×2 IMPLANT

## 2016-11-23 NOTE — Research (Signed)
STROKE-AF Informed Consent   Subject Name: Keith [name]  Subject met inclusion and exclusion criteria.  The informed consent form, study requirements and expectations were reviewed with the subject and questions and concerns were addressed prior to the signing of the consent form.  The subject verbalized understanding of the trail requirements.  The subject agreed to participate in the STROKE-AF trial and signed the informed consent.  The informed consent was obtained prior to performance of any protocol-specific procedures for the subject.  A copy of the signed informed consent was given to the subject and a copy was placed in the subject's medical record. Randomized to CONTINUOUS MONITORING REVEAL.  Hedrick,Baylin Cabal W 11/23/2016, 1200

## 2016-11-23 NOTE — Consult Note (Signed)
ELECTROPHYSIOLOGY CONSULT NOTE  Patient ID: Keith [name] MRN: [id number], DOB/AGE: 1941-01-13   Admit date: 11/19/2016 Date of Consult: 11/23/2016  Reason for Consultation: Stroke; referred by Dr Erlinda Hong for consideration of Stroke AF trial  History of Present Illness Keith [name] was admitted on 11/19/2016 with acute CVA.   He has been found to have a left MCA small infact,felt to be secondary to large vessel disease source of the L ICA.  The patient underwent 4V cerebral angiogram by Dr Estanislado Pandy and has been found to have an occluded Lt ICA prox with partial distal reconstitution from the ipsilateral OA.   The patient is making clinical improvement.  EP has been asked to evaluate for placement of an implantable loop recorder to monitor for atrial fibrillation post stroke.  Past Medical History Past Medical History:  Diagnosis Date  . Hypothyroidism   . Obstructive uropathy     Past Surgical History History reviewed. No pertinent surgical history.  Allergies/Intolerances Allergies  Allergen Reactions  . Penicillins Other (See Comments)    unknown   Inpatient Medications . [MAR Hold] aspirin EC  81 mg Oral Daily  . [MAR Hold] atorvastatin  40 mg Oral q1800  . lidocaine       . sodium chloride 75 mL/hr at 11/23/16 1353   Social History Social History   Socioeconomic History  . Marital status: Married    Spouse name: Not on file  . Number of children: Not on file  . Years of education: Not on file  . Highest education level: Not on file  Social Needs  . Financial resource strain: Not on file  . Food insecurity - worry: Not on file  . Food insecurity - inability: Not on file  . Transportation needs - medical: Not on file  . Transportation needs - non-medical: Not on file  Occupational History  . Not on file  Tobacco Use  . Smoking status: Never Smoker  . Smokeless tobacco: Never Used  Substance and Sexual Activity  . Alcohol use: No  . Drug use: No  . Sexual  activity: Not on file  Other Topics Concern  . Not on file  Social History Narrative  . Not on file    Review of Systems All other systems reviewed and are otherwise negative except as noted above.  Physical Exam Blood pressure (!) 154/45, pulse (!) 50, temperature 98.1 F (36.7 C), temperature source Oral, resp. rate 16, height 5\' 9"  (1.753 m), weight 63.5 kg (140 lb), SpO2 100 %.  General: Well developed, well appearing 76 y.o. male in no acute distress. HEENT: skin lesion on scalp noted. EOMs intact. Sclera nonicteric. Oropharynx clear.  Neck: Supple without bruits. No JVD. Lungs: Respirations regular and unlabored, CTA bilaterally. No wheezes, rales or rhonchi. Heart: RRR. S1, S2 present. No murmurs, rub, S3 or S4. Abdomen: Soft, non-tender, non-distended. BS present x 4 quadrants. No hepatosplenomegaly.  Extremities: No clubbing, cyanosis or edema. DP/PT/Radials 2+ and equal bilaterally. Psych: Normal affect. Musculoskeletal: No kyphosis. Skin: Intact. Warm and dry. No rashes or petechiae in exposed areas.   Labs Lab Results  Component Value Date   WBC 4.3 11/23/2016   HGB 11.7 (L) 11/23/2016   HCT 35.9 (L) 11/23/2016   MCV 91.1 11/23/2016   PLT 145 (L) 11/23/2016    Recent Labs  Lab 11/19/16 1902  11/23/16 1100  NA 137   < > 137  K 4.3   < > 5.9*  CL 107   < >  117*  CO2 22   < > 12*  BUN 28*   < > 22*  CREATININE 2.38*   < > 2.09*  CALCIUM 8.9   < > 8.1*  PROT 7.0  --   --   BILITOT 0.5  --   --   ALKPHOS 89  --   --   ALT 10*  --   --   AST 15  --   --   GLUCOSE 133*   < > 70   < > = values in this interval not displayed.   No results for input(s): INR in the last 72 hours.  Radiology/Studies Ct Head Wo Contrast  Result Date: 11/21/2016 CLINICAL DATA:  Follow-up subarachnoid hemorrhage and acute LEFT MCA territory infarct. EXAM: CT HEAD WITHOUT CONTRAST TECHNIQUE: Contiguous axial images were obtained from the base of the skull through the vertex without  intravenous contrast. COMPARISON:  CT HEAD November 19, 2016 and MRI of the head November 20, 2016 FINDINGS: BRAIN: No subarachnoid hemorrhage or extra-axial density present on this CT. Wedge-like hypodensity LEFT frontal lobe extending from operculum to insula. No intraparenchymal hemorrhage, mass effect nor midline shift. The ventricles and sulci are normal for age. Patchy supratentorial white matter hypodensities less than expected for patient's age, though non-specific are most compatible with chronic small vessel ischemic disease. No acute large vascular territory infarcts. No abnormal extra-axial fluid collections. Basal cisterns are patent. VASCULAR: Moderate calcific atherosclerosis of the carotid siphons. SKULL: No skull fracture. 2.6 cm solid mass LEFT frontal vertex scalp without underlying osseous abnormality. SINUSES/ORBITS: Included paranasal sinuses are well-aerated. Under pneumatized LEFT mastoid air cells.The included ocular globes and orbital contents are non-suspicious. Similar enophthalmos. OTHER: None. IMPRESSION: 1. No subarachnoid hemorrhage on today's CT. 2. Evolving nonhemorrhagic small LEFT frontal/MCA territory infarct. 3. 2.6 cm soft tissue mass LEFT frontal scalp, recommend nonemergent dermal a Hologic consultation. Electronically Signed   By: Elon Alas M.D.   On: 11/21/2016 06:04   Ct Head Wo Contrast  Result Date: 11/19/2016 CLINICAL DATA:  Unsteady gait, dizziness and tinnitus beginning this morning. EXAM: CT HEAD WITHOUT CONTRAST TECHNIQUE: Contiguous axial images were obtained from the base of the skull through the vertex without intravenous contrast. COMPARISON:  CT HEAD July 08, 2010 FINDINGS: BRAIN: No intraparenchymal hemorrhage, mass effect nor midline shift. The ventricles and sulci are normal for age. Patchy supratentorial white matter hypodensities less than expected for patient's age, though non-specific are most compatible with chronic small vessel ischemic  disease. No acute large vascular territory infarcts. No abnormal extra-axial fluid collections. Basal cisterns are patent. VASCULAR: Moderate calcific atherosclerosis of the carotid siphons. SKULL: No skull fracture. New 2.6 cm soft tissue solid mass LEFT frontal vertex scalp, no underlying osseous abnormality. SINUSES/ORBITS: Paranasal sinus are well aerated. Under pneumatized LEFT mastoid air cells.The included ocular globes and orbital contents are non-suspicious. Similar enophthalmos. OTHER: Patient is edentulous . IMPRESSION: 1. Trace LEFT frontal acute to subacute subarachnoid hemorrhage. Recommend MRI of the brain on nonemergent basis. 2. Otherwise negative CT HEAD for age. 3. New 2.5 cm soft tissue mass LEFT frontal scalp concerning for skin cancer. Recommend dermatologic consultation on a nonemergent basis. 4. Critical Value/emergent results were called by telephone at the time of interpretation on 11/19/2016 at 11:25 pm to Dr. Dorie Rank , who verbally acknowledged these results. Electronically Signed   By: Elon Alas M.D.   On: 11/19/2016 23:27   Mr Jodene Nam Head Wo Contrast  Result Date: 11/20/2016 CLINICAL DATA:  Unsteady gait, dizziness and tinnitus beginning this morning. Suspect stroke. Follow-up potential subarachnoid hemorrhage. EXAM: MRI HEAD WITHOUT CONTRAST MRA HEAD WITHOUT CONTRAST TECHNIQUE: Multiplanar, multiecho pulse sequences of the brain and surrounding structures were obtained without intravenous contrast. Angiographic images of the head were obtained using MRA technique without contrast. COMPARISON:  CT HEAD November 19, 2016 FINDINGS: MRI HEAD FINDINGS BRAIN: Patchy reduced diffusion LEFT insula, LEFT frontal lobe extending to the operculum with low ADC values and faint T2 hyperintense signal. No abnormal intracranial susceptibility artifact. Focal susceptibility artifact and T1 shortening LEFT MCA at the level of the insula. Ventricles and sulci are normal for patient's. No  additional levels of with normal signal. No midline shift, mass effect or masses. No abnormal extra-axial fluid collections. VASCULAR: T2 bright signal LEFT internal carotid artery from cervical segment to supraclinoid segment. SKULL AND UPPER CERVICAL SPINE: No abnormal sellar expansion. No suspicious calvarial bone marrow signal. Craniocervical junction maintained. 3 x 2.7 x 1.4 cm (AP by transverse by CC) solid midline frontal scalp mass without calvarial invasion. SINUSES/ORBITS: Chronic LEFT mastoid effusion. Paranasal sinus are well aerated. The included ocular globes and orbital contents are non-suspicious. OTHER: Patient is edentulous. MRA HEAD FINDINGS ANTERIOR CIRCULATION: Complete loss of flow related enhancement LEFT internal carotid artery from the included cervical segment. Thready reconstitution supraclinoid LEFT internal carotid artery. Normal flow related enhancement of the RIGHT included cervical, petrous, cavernous and supraclinoid internal carotid arteries. Moderate stenosis RIGHT supraclinoid internal carotid artery stenosis. Patent anterior communicating artery, amorphous LEFT A1-2 junction, possible fenestrated versus small aneurysm. Suspected severe stenosis RIGHT A1 origin. Patent presumable retrograde flow LEFT A1 segment. Thready flow related enhancement LEFT middle cerebral artery, occluded LEFT M2 segment. Normal flow related enhancement RIGHT middle cerebral artery. POSTERIOR CIRCULATION: RIGHT vertebral artery is dominant. Basilar artery is patent, with normal flow related enhancement of the main branch vessels. Fenestrated vertebrobasilar junction. Patent posterior cerebral arteries. Posterior communicating artery's not identified. No large vessel occlusion, flow limiting stenosis,  aneurysm. ANATOMIC VARIANTS: None. Source images and MIP images were reviewed. IMPRESSION: MRI HEAD: 1. Multifocal acute small frontal/ LEFT MCA territory nonhemorrhagic infarct. 2. 3 x 2.7 x 1.4 cm solid  frontal scalp mass consistent with neoplasm, no calvarial invasion. Recommend non emergent dermatologic consultation. 3. No MR correlate for suspected LEFT frontal lobe subarachnoid hemorrhage ; please note MRI is frequently insensitive to hyperacute subarachnoid hemorrhage. 4. Recommend CTA HEAD and neck for further characterization. MRA HEAD: 1. Occluded LEFT internal carotid artery with thready reconstitution LEFT supraclinoid segment. 2. Slow flow LEFT middle cerebral artery, occluded LEFT M2 segment. 3. Amorphous LEFT A1-2 junction, this could reflect small aneurysm, fenestration or turbulent retrograde flow. 4. Moderate stenosis RIGHT supraclinoid internal carotid artery. Suspected severe stenosis RIGHT A1 origin. 5. Recommend CTA HEAD and neck for further characterization. Acute findings discussed with and reconfirmed by Dr.Kirkpatrick, Neurology on 11/20/2016 at 2:15 am. Electronically Signed   By: Elon Alas M.D.   On: 11/20/2016 02:45   Mr Brain Wo Contrast  Result Date: 11/20/2016 CLINICAL DATA:  Unsteady gait, dizziness and tinnitus beginning this morning. Suspect stroke. Follow-up potential subarachnoid hemorrhage. EXAM: MRI HEAD WITHOUT CONTRAST MRA HEAD WITHOUT CONTRAST TECHNIQUE: Multiplanar, multiecho pulse sequences of the brain and surrounding structures were obtained without intravenous contrast. Angiographic images of the head were obtained using MRA technique without contrast. COMPARISON:  CT HEAD November 19, 2016 FINDINGS: MRI HEAD FINDINGS BRAIN: Patchy reduced diffusion LEFT insula, LEFT frontal lobe extending to the operculum with  low ADC values and faint T2 hyperintense signal. No abnormal intracranial susceptibility artifact. Focal susceptibility artifact and T1 shortening LEFT MCA at the level of the insula. Ventricles and sulci are normal for patient's. No additional levels of with normal signal. No midline shift, mass effect or masses. No abnormal extra-axial fluid  collections. VASCULAR: T2 bright signal LEFT internal carotid artery from cervical segment to supraclinoid segment. SKULL AND UPPER CERVICAL SPINE: No abnormal sellar expansion. No suspicious calvarial bone marrow signal. Craniocervical junction maintained. 3 x 2.7 x 1.4 cm (AP by transverse by CC) solid midline frontal scalp mass without calvarial invasion. SINUSES/ORBITS: Chronic LEFT mastoid effusion. Paranasal sinus are well aerated. The included ocular globes and orbital contents are non-suspicious. OTHER: Patient is edentulous. MRA HEAD FINDINGS ANTERIOR CIRCULATION: Complete loss of flow related enhancement LEFT internal carotid artery from the included cervical segment. Thready reconstitution supraclinoid LEFT internal carotid artery. Normal flow related enhancement of the RIGHT included cervical, petrous, cavernous and supraclinoid internal carotid arteries. Moderate stenosis RIGHT supraclinoid internal carotid artery stenosis. Patent anterior communicating artery, amorphous LEFT A1-2 junction, possible fenestrated versus small aneurysm. Suspected severe stenosis RIGHT A1 origin. Patent presumable retrograde flow LEFT A1 segment. Thready flow related enhancement LEFT middle cerebral artery, occluded LEFT M2 segment. Normal flow related enhancement RIGHT middle cerebral artery. POSTERIOR CIRCULATION: RIGHT vertebral artery is dominant. Basilar artery is patent, with normal flow related enhancement of the main branch vessels. Fenestrated vertebrobasilar junction. Patent posterior cerebral arteries. Posterior communicating artery's not identified. No large vessel occlusion, flow limiting stenosis,  aneurysm. ANATOMIC VARIANTS: None. Source images and MIP images were reviewed. IMPRESSION: MRI HEAD: 1. Multifocal acute small frontal/ LEFT MCA territory nonhemorrhagic infarct. 2. 3 x 2.7 x 1.4 cm solid frontal scalp mass consistent with neoplasm, no calvarial invasion. Recommend non emergent dermatologic  consultation. 3. No MR correlate for suspected LEFT frontal lobe subarachnoid hemorrhage ; please note MRI is frequently insensitive to hyperacute subarachnoid hemorrhage. 4. Recommend CTA HEAD and neck for further characterization. MRA HEAD: 1. Occluded LEFT internal carotid artery with thready reconstitution LEFT supraclinoid segment. 2. Slow flow LEFT middle cerebral artery, occluded LEFT M2 segment. 3. Amorphous LEFT A1-2 junction, this could reflect small aneurysm, fenestration or turbulent retrograde flow. 4. Moderate stenosis RIGHT supraclinoid internal carotid artery. Suspected severe stenosis RIGHT A1 origin. 5. Recommend CTA HEAD and neck for further characterization. Acute findings discussed with and reconfirmed by Dr.Kirkpatrick, Neurology on 11/20/2016 at 2:15 am. Electronically Signed   By: Elon Alas M.D.   On: 11/20/2016 02:45    Echocardiogram  11/21/16 echo reveals EF 55%, moderate diastolic dysfunction  86-VHQI ECG ekgs from 2012 are reviewed (all ekgs in epic).  I do not see afib  Assessment and Plan 1. Stroke The patient is s/p stroke due to CV occlusion.  EP has been asked to evaluate for loop recorder insertion to monitor for AF according to Stroke AF protocol. The indication for loop recorder insertion / monitoring for AF in setting of stroke was discussed with the patient. The loop recorder insertion procedure was reviewed in detail including risks and benefits. These risks include but are not limited to bleeding and infection. The patient expressed verbal understanding and agrees to proceed. The patient was also counseled regarding wound care and device follow-up.  Army Fossa MD 11/23/2016, 2:52 PM

## 2016-11-23 NOTE — Care Management Note (Signed)
Case Management Note  Patient Details  Name: LOIC HOBIN MRN: [id number] Date of Birth: 1940/11/12  Subjective/Objective:                    Action/Plan: Pt discharging to CIR today. CM consulted to assist patient with PCP. CSW with CIR will be able to assist with this closer to d/c. No further needs per CM.  Expected Discharge Date:                  Expected Discharge Plan:  [place]  In-House Referral:     Discharge planning Services  CM Consult  Post Acute Care Choice:    Choice offered to:     DME Arranged:    DME Agency:     HH Arranged:    [place] Agency:     Status of Service:  Completed, signed off  If discussed at [employer] of Stay Meetings, dates discussed:    Additional Comments:  Pollie Friar, RN 11/23/2016, 12:23 PM

## 2016-11-23 NOTE — H&P (Addendum)
Physical Medicine and Rehabilitation Admission H&P         Chief Complaint  Patient presents with  . Unsteady Gait      Ear discomfort  : HPI: Keith [name] is a 76 y.o. right handed male with documented history of hypothyroidism, CKD stage III as well as a 2.5 cm mass at the top of the skull that he has had for a number of years and monitored. Patient on no prescription medications at time of admission.  Per chart review patient lives alone. Independent working as a Presenter, broadcasting. One level home. He has a son in the area that can arrange assistance as needed. Presented 11/19/2016 with headache and some incoordination of the right side. Patient denied any trauma or fall. Noted creatinine 2.38 from latest baseline of 3.20. TSH 5.170. Troponin negative. CT of the head reviewed, showing left SAH.  Per report, trace left frontal acute to subacute subarachnoid hemorrhage. MRI showed multifocal acute small frontal left MCA territory nonhemorrhagic infarction. 2.3 x 2.7 x 1.4 cm solid frontal scalp mass consistent with neoplasm. Carotid Doppler was 60-79% mid internal carotid artery stenosis, left internal carotid artery appeared occluded MRA showed occluded left internal carotid artery with thready reconstitution left supraclinoid segment. Moderate stenosis right supraclinoid internal carotid artery. Lower extremity Dopplers negative for DVT.  Echocardiogram with ejection fraction of 58% grade 2 diastolic dysfunction.. Neurology consulted and continue to follow with repeat cranial CT scan 11/20/2016 showing no subarachnoid hemorrhage on follow-up scan as well as evolving nonhemorrhagic small left frontal MCA territory infarct and a 2.6 cm soft tissue mass left frontal scalp. Patient would follow-up outpatient dermatology for evaluation of frontal scalp solid large nodule. Cerebral arteriogram later completed 11/23/2016 showing approximately 45% stenosis right ICA proximal. Occluded left ICA proximal with  partial distal reconstitution from ipsilateral OA. Maintained on aspirin for CVA prophylaxis. Currently on dysphagia #3 thin liquid diet. Physical and occupational therapy evaluations completed with recommendations of physical medicine rehabilitation consult.  patient was admitted for a comprehensive rehabilitation program    Patient indicates that he was very active prior to his stroke and worked full-time.  He login about 5 miles per night working as a Presenter, broadcasting at [employer]   Review of Systems  Constitutional: Negative for chills and fever.  HENT: Negative for hearing loss.   Eyes: Negative for blurred vision, double vision and discharge.  Respiratory: Negative for shortness of breath.   Cardiovascular: Negative for chest pain, palpitations and leg swelling.  Gastrointestinal: Positive for constipation. Negative for nausea and vomiting.  Genitourinary: Positive for urgency.  Musculoskeletal: Positive for joint pain and myalgias.  Skin: Negative for rash.  Neurological: Positive for dizziness, speech change, focal weakness and headaches. Negative for seizures.  All other systems reviewed and are negative.   Past Medical History:  Diagnosis Date  . Hypothyroidism    . Obstructive uropathy      History reviewed. No pertinent surgical history.      Family History  Problem Relation Age of Onset  . Lung cancer Father      Social History:  reports that  has never smoked. he has never used smokeless tobacco. He reports that he does not drink alcohol or use drugs. Allergies:       Allergies  Allergen Reactions  . Penicillins Other (See Comments)      unknown    No medications prior to admission.      Drug Regimen Review Drug regimen was reviewed  and remains appropriate with no significant issues identified   Home: Home Living Family/patient expects to be discharged to:: Private residence Living Arrangements: Alone Available Help at Discharge: Family, Available 24  hours/day Type of Home: House Home Access: Stairs to enter [employer] of Steps: 1 - at pt's house, level entry at son's [place]: One level Bathroom Shower/Tub: Chiropodist: Standard Home Equipment: None   Functional History: Prior Function Level of Independence: Independent Comments: Independent with ADLs, IADLs, driving, and has worked for [employer] for [employer] Status:  Mobility: Bed Mobility Overal bed mobility: Modified Independent Bed Mobility: Supine to Sit Supine to sit: HOB elevated, Min guard General bed mobility comments: Min Guard A for safety and VCs to sequence task safety Transfers Overall transfer level: Needs assistance Transfers: Sit to/from Stand Sit to Stand: Min guard General transfer comment: pt with ability to stand from bed and perform sit<>stand x 5 without use of bil UE with guarding for lines and safety, 1 episode of LOB Ambulation/Gait Ambulation/Gait assistance: Min guard Ambulation Distance (Feet): 250 Feet Assistive device: Hemi-walker, None Gait Pattern/deviations: Step-through pattern, Decreased stride length, Trunk flexed General Gait Details: pt initiating gait with hemiwalker but after 50' removed hemiwalker with pt able to walk remainder of hallway with cues for posture and looking up, no right lean in standing today.  Gait velocity interpretation: Below normal speed for age/gender   ADL: ADL Overall ADL's : Needs assistance/impaired Eating/Feeding: Minimal assistance, Sitting Eating/Feeding Details (indicate cue type and reason): supported sitting Grooming: Minimal assistance, Sitting Grooming Details (indicate cue type and reason): pt requiring Min A to correct postural control and balance. Pt requiring Min A for bilateral tasks and needs increased assistance to incorporate RUE Upper Body Bathing: Moderate assistance, Sitting Lower Body Bathing: Maximal assistance, +2 for physical  assistance, Sit to/from stand Upper Body Dressing : Moderate assistance, Sitting Lower Body Dressing: Maximal assistance, +2 for physical assistance, Sit to/from stand Lower Body Dressing Details (indicate cue type and reason): Pt requiring physical A to maintain sitting balance while adjusting socks.  Toilet Transfer: Maximal assistance, +2 for physical assistance, Ambulation(Simulated in room) Functional mobility during ADLs: Maximal assistance, +2 for physical assistance, Cueing for sequencing, Cueing for safety General ADL Comments: Pt demonstrating decreased functional performance. Presenting with decreased functional use of R (dominant) hand, inattention to his R side, decreased cognition, and poor balance   Cognition: Cognition Overall Cognitive Status: Within Functional Limits for tasks assessed Orientation Level: Oriented X4 Cognition Arousal/Alertness: Awake/alert Behavior During Therapy: WFL for tasks assessed/performed Overall Cognitive Status: Within Functional Limits for tasks assessed Area of Impairment: Safety/judgement, Awareness, Following commands, Problem solving Following Commands: Follows one step commands with increased time Safety/Judgement: Decreased awareness of deficits Awareness: Emergent Problem Solving: Slow processing, Difficulty sequencing, Requires verbal cues, Requires tactile cues General Comments: pt aware of RUE deficits and attending to placement of RUE today   Physical Exam: Blood pressure (!) 138/54, pulse 66, temperature 98.6 F (37 C), temperature source Oral, resp. rate 14, height _0  (1.753 m), weight 63.5 kg (140 lb), SpO2 98 %. Physical Exam Patient with a cystic mass top of the skull that is nontender. Patient stated it had been there for a number of years , only became larger in the last couple months, states that it was a hematoma, it is cyanotic and soft. Eyes: EOM are normal. Right eye exhibits no discharge. Left eye exhibits no  discharge.  Neck: Normal range of  motion. Neck supple. No thyromegaly present.  Cardiovascular: Normal rate, regular rhythm and normal heart sounds.  Respiratory: Effort normal and breath sounds normal. No respiratory distress.  GI: Soft. Bowel sounds are normal. He exhibits no distension.  Musculoskeletal: He exhibits no edema or tenderness. Skin. Warm and dry  Neurological: He is alert and oriented to person, place, and time.  He has a normal mood and affect. Thought content is normal  He followed full commands. Motor: RUE: 4/5 proximal to distal with ataxia RLE: 3+/5 proximal to distal with ataxia LUE: 4+/5  Proximal to distal LLE: 4-/5 proximal to distal Speech without evidence of a aphasia or apraxia.  Sensation intact light touch bilateral upper and lower extremities There is decreased fine motor in the right upper extremity. Lab Results Last 48 Hours        Results for orders placed or performed during the hospital encounter of 11/19/16 (from the past 48 hour(s))  CBC with Differential     Status: Abnormal    Collection Time: 11/19/16  7:02 PM  Result Value Ref Range    WBC 7.2 4.0 - 10.5 K/uL    RBC 4.39 4.22 - 5.81 MIL/uL    Hemoglobin 12.9 (L) 13.0 - 17.0 g/dL    HCT 40.2 39.0 - 52.0 %    MCV 91.6 78.0 - 100.0 fL    MCH 29.4 26.0 - 34.0 pg    MCHC 32.1 30.0 - 36.0 g/dL    RDW 13.6 11.5 - 15.5 %    Platelets 239 150 - 400 K/uL    Neutrophils Relative % 68 %    Neutro Abs 4.9 1.7 - 7.7 K/uL    Lymphocytes Relative 22 %    Lymphs Abs 1.6 0.7 - 4.0 K/uL    Monocytes Relative 7 %    Monocytes Absolute 0.5 0.1 - 1.0 K/uL    Eosinophils Relative 3 %    Eosinophils Absolute 0.2 0.0 - 0.7 K/uL    Basophils Relative 0 %    Basophils Absolute 0.0 0.0 - 0.1 K/uL  Comprehensive metabolic panel     Status: Abnormal    Collection Time: 11/19/16  7:02 PM  Result Value Ref Range    Sodium 137 135 - 145 mmol/L    Potassium 4.3 3.5 - 5.1 mmol/L    Chloride 107 101 - 111 mmol/L     CO2 22 22 - 32 mmol/L    Glucose, Bld 133 (H) 65 - 99 mg/dL    BUN 28 (H) 6 - 20 mg/dL    Creatinine, Ser 2.38 (H) 0.61 - 1.24 mg/dL    Calcium 8.9 8.9 - 10.3 mg/dL    Total Protein 7.0 6.5 - 8.1 g/dL    Albumin 3.5 3.5 - 5.0 g/dL    AST 15 15 - 41 U/L    ALT 10 (L) 17 - 63 U/L    Alkaline Phosphatase 89 38 - 126 U/L    Total Bilirubin 0.5 0.3 - 1.2 mg/dL    GFR calc non Af Amer 25 (L) >60 mL/min    GFR calc Af Amer 29 (L) >60 mL/min      Comment: (NOTE) The eGFR has been calculated using the CKD EPI equation. This calculation has not been validated in all clinical situations. eGFR's persistently <60 mL/min signify possible Chronic Kidney Disease.      Anion gap 8 5 - 15  Protime-INR     Status: None    Collection Time: 11/19/16  9:24  PM  Result Value Ref Range    Prothrombin Time 12.9 11.4 - 15.2 seconds    INR 0.98    APTT     Status: None    Collection Time: 11/19/16  9:24 PM  Result Value Ref Range    aPTT 31 24 - 36 seconds  I-stat troponin, ED     Status: None    Collection Time: 11/19/16  9:29 PM  Result Value Ref Range    Troponin i, poc 0.00 0.00 - 0.08 ng/mL    Comment 3               Comment: Due to the release kinetics of cTnI, a negative result within the first hours of the onset of symptoms does not rule out myocardial infarction with certainty. If myocardial infarction is still suspected, repeat the test at appropriate intervals.    TSH     Status: Abnormal    Collection Time: 11/20/16  2:45 AM  Result Value Ref Range    TSH 5.170 (H) 0.350 - 4.500 uIU/mL      Comment: Performed by a 3rd Generation assay with a functional sensitivity of <=0.01 uIU/mL.  Hemoglobin A1c     Status: None    Collection Time: 11/20/16  2:45 AM  Result Value Ref Range    Hgb A1c MFr Bld 5.5 4.8 - 5.6 %      Comment: (NOTE) Pre diabetes:          5.7%-6.4% Diabetes:              >6.4% Glycemic control for   <7.0% adults with diabetes      Mean Plasma Glucose 111.15  mg/dL  Lipid panel     Status: Abnormal    Collection Time: 11/20/16  2:45 AM  Result Value Ref Range    Cholesterol 183 0 - 200 mg/dL    Triglycerides 115 <150 mg/dL    HDL 35 (L) >40 mg/dL    Total CHOL/HDL Ratio 5.2 RATIO    VLDL 23 0 - 40 mg/dL    LDL Cholesterol 125 (H) 0 - 99 mg/dL      Comment:        Total Cholesterol/HDL:CHD Risk Coronary Heart Disease Risk Table                     Men   Women  1/2 Average Risk   3.4   3.3  Average Risk       5.0   4.4  2 X Average Risk   9.6   7.1  3 X Average Risk  23.4   11.0        Use the calculated Patient Ratio above and the CHD Risk Table to determine the patient's CHD Risk.        ATP III CLASSIFICATION (LDL):  <100     mg/dL   Optimal  100-129  mg/dL   Near or Above                    Optimal  130-159  mg/dL   Borderline  160-189  mg/dL   High  >190     mg/dL   Very High    MRSA PCR Screening     Status: None    Collection Time: 11/20/16  4:21 AM  Result Value Ref Range    MRSA by PCR NEGATIVE NEGATIVE      Comment:  The GeneXpert MRSA Assay (FDA approved for NASAL specimens only), is one component of a comprehensive MRSA colonization surveillance program. It is not intended to diagnose MRSA infection nor to guide or monitor treatment for MRSA infections.    T4, free     Status: None    Collection Time: 11/21/16  5:08 AM  Result Value Ref Range    Free T4 0.72 0.61 - 1.12 ng/dL      Comment: (NOTE) Biotin ingestion may interfere with free T4 tests. If the results are inconsistent with the TSH level, previous test results, or the clinical presentation, then consider biotin interference. If needed, order repeat testing after stopping biotin.    CBC     Status: Abnormal    Collection Time: 11/21/16  5:08 AM  Result Value Ref Range    WBC 5.7 4.0 - 10.5 K/uL    RBC 3.86 (L) 4.22 - 5.81 MIL/uL    Hemoglobin 11.1 (L) 13.0 - 17.0 g/dL    HCT 35.2 (L) 39.0 - 52.0 %    MCV 91.2 78.0 - 100.0 fL    MCH  28.8 26.0 - 34.0 pg    MCHC 31.5 30.0 - 36.0 g/dL    RDW 13.4 11.5 - 15.5 %    Platelets 157 150 - 400 K/uL  Basic metabolic panel     Status: Abnormal    Collection Time: 11/21/16  5:08 AM  Result Value Ref Range    Sodium 139 135 - 145 mmol/L    Potassium 4.3 3.5 - 5.1 mmol/L    Chloride 114 (H) 101 - 111 mmol/L    CO2 21 (L) 22 - 32 mmol/L    Glucose, Bld 80 65 - 99 mg/dL    BUN 21 (H) 6 - 20 mg/dL    Creatinine, Ser 2.26 (H) 0.61 - 1.24 mg/dL    Calcium 8.4 (L) 8.9 - 10.3 mg/dL    GFR calc non Af Amer 27 (L) >60 mL/min    GFR calc Af Amer 31 (L) >60 mL/min      Comment: (NOTE) The eGFR has been calculated using the CKD EPI equation. This calculation has not been validated in all clinical situations. eGFR's persistently <60 mL/min signify possible Chronic Kidney Disease.      Anion gap 4 (L) 5 - 15       Imaging Results (Last 48 hours)  Ct Head Wo Contrast   Result Date: 11/21/2016 CLINICAL DATA:  Follow-up subarachnoid hemorrhage and acute LEFT MCA territory infarct. EXAM: CT HEAD WITHOUT CONTRAST TECHNIQUE: Contiguous axial images were obtained from the base of the skull through the vertex without intravenous contrast. COMPARISON:  CT HEAD November 19, 2016 and MRI of the head November 20, 2016 FINDINGS: BRAIN: No subarachnoid hemorrhage or extra-axial density present on this CT. Wedge-like hypodensity LEFT frontal lobe extending from operculum to insula. No intraparenchymal hemorrhage, mass effect nor midline shift. The ventricles and sulci are normal for age. Patchy supratentorial white matter hypodensities less than expected for patient's age, though non-specific are most compatible with chronic small vessel ischemic disease. No acute large vascular territory infarcts. No abnormal extra-axial fluid collections. Basal cisterns are patent. VASCULAR: Moderate calcific atherosclerosis of the carotid siphons. SKULL: No skull fracture. 2.6 cm solid mass LEFT frontal vertex scalp without  underlying osseous abnormality. SINUSES/ORBITS: Included paranasal sinuses are well-aerated. Under pneumatized LEFT mastoid air cells.The included ocular globes and orbital contents are non-suspicious. Similar enophthalmos. OTHER: None. IMPRESSION: 1. No subarachnoid hemorrhage on today's  CT. 2. Evolving nonhemorrhagic small LEFT frontal/MCA territory infarct. 3. 2.6 cm soft tissue mass LEFT frontal scalp, recommend nonemergent dermal a Hologic consultation. Electronically Signed   By: Elon Alas M.D.   On: 11/21/2016 06:04    Ct Head Wo Contrast   Result Date: 11/19/2016 CLINICAL DATA:  Unsteady gait, dizziness and tinnitus beginning this morning. EXAM: CT HEAD WITHOUT CONTRAST TECHNIQUE: Contiguous axial images were obtained from the base of the skull through the vertex without intravenous contrast. COMPARISON:  CT HEAD July 08, 2010 FINDINGS: BRAIN: No intraparenchymal hemorrhage, mass effect nor midline shift. The ventricles and sulci are normal for age. Patchy supratentorial white matter hypodensities less than expected for patient's age, though non-specific are most compatible with chronic small vessel ischemic disease. No acute large vascular territory infarcts. No abnormal extra-axial fluid collections. Basal cisterns are patent. VASCULAR: Moderate calcific atherosclerosis of the carotid siphons. SKULL: No skull fracture. New 2.6 cm soft tissue solid mass LEFT frontal vertex scalp, no underlying osseous abnormality. SINUSES/ORBITS: Paranasal sinus are well aerated. Under pneumatized LEFT mastoid air cells.The included ocular globes and orbital contents are non-suspicious. Similar enophthalmos. OTHER: Patient is edentulous . IMPRESSION: 1. Trace LEFT frontal acute to subacute subarachnoid hemorrhage. Recommend MRI of the brain on nonemergent basis. 2. Otherwise negative CT HEAD for age. 3. New 2.5 cm soft tissue mass LEFT frontal scalp concerning for skin cancer. Recommend dermatologic consultation  on a nonemergent basis. 4. Critical Value/emergent results were called by telephone at the time of interpretation on 11/19/2016 at 11:25 pm to Dr. Dorie Rank , who verbally acknowledged these results. Electronically Signed   By: Elon Alas M.D.   On: 11/19/2016 23:27    Mr Jodene Nam Head Wo Contrast   Result Date: 11/20/2016 CLINICAL DATA:  Unsteady gait, dizziness and tinnitus beginning this morning. Suspect stroke. Follow-up potential subarachnoid hemorrhage. EXAM: MRI HEAD WITHOUT CONTRAST MRA HEAD WITHOUT CONTRAST TECHNIQUE: Multiplanar, multiecho pulse sequences of the brain and surrounding structures were obtained without intravenous contrast. Angiographic images of the head were obtained using MRA technique without contrast. COMPARISON:  CT HEAD November 19, 2016 FINDINGS: MRI HEAD FINDINGS BRAIN: Patchy reduced diffusion LEFT insula, LEFT frontal lobe extending to the operculum with low ADC values and faint T2 hyperintense signal. No abnormal intracranial susceptibility artifact. Focal susceptibility artifact and T1 shortening LEFT MCA at the level of the insula. Ventricles and sulci are normal for patient's. No additional levels of with normal signal. No midline shift, mass effect or masses. No abnormal extra-axial fluid collections. VASCULAR: T2 bright signal LEFT internal carotid artery from cervical segment to supraclinoid segment. SKULL AND UPPER CERVICAL SPINE: No abnormal sellar expansion. No suspicious calvarial bone marrow signal. Craniocervical junction maintained. 3 x 2.7 x 1.4 cm (AP by transverse by CC) solid midline frontal scalp mass without calvarial invasion. SINUSES/ORBITS: Chronic LEFT mastoid effusion. Paranasal sinus are well aerated. The included ocular globes and orbital contents are non-suspicious. OTHER: Patient is edentulous. MRA HEAD FINDINGS ANTERIOR CIRCULATION: Complete loss of flow related enhancement LEFT internal carotid artery from the included cervical segment. Thready  reconstitution supraclinoid LEFT internal carotid artery. Normal flow related enhancement of the RIGHT included cervical, petrous, cavernous and supraclinoid internal carotid arteries. Moderate stenosis RIGHT supraclinoid internal carotid artery stenosis. Patent anterior communicating artery, amorphous LEFT A1-2 junction, possible fenestrated versus small aneurysm. Suspected severe stenosis RIGHT A1 origin. Patent presumable retrograde flow LEFT A1 segment. Thready flow related enhancement LEFT middle cerebral artery, occluded LEFT M2 segment. Normal flow  related enhancement RIGHT middle cerebral artery. POSTERIOR CIRCULATION: RIGHT vertebral artery is dominant. Basilar artery is patent, with normal flow related enhancement of the main branch vessels. Fenestrated vertebrobasilar junction. Patent posterior cerebral arteries. Posterior communicating artery's not identified. No large vessel occlusion, flow limiting stenosis,  aneurysm. ANATOMIC VARIANTS: None. Source images and MIP images were reviewed. IMPRESSION: MRI HEAD: 1. Multifocal acute small frontal/ LEFT MCA territory nonhemorrhagic infarct. 2. 3 x 2.7 x 1.4 cm solid frontal scalp mass consistent with neoplasm, no calvarial invasion. Recommend non emergent dermatologic consultation. 3. No MR correlate for suspected LEFT frontal lobe subarachnoid hemorrhage ; please note MRI is frequently insensitive to hyperacute subarachnoid hemorrhage. 4. Recommend CTA HEAD and neck for further characterization. MRA HEAD: 1. Occluded LEFT internal carotid artery with thready reconstitution LEFT supraclinoid segment. 2. Slow flow LEFT middle cerebral artery, occluded LEFT M2 segment. 3. Amorphous LEFT A1-2 junction, this could reflect small aneurysm, fenestration or turbulent retrograde flow. 4. Moderate stenosis RIGHT supraclinoid internal carotid artery. Suspected severe stenosis RIGHT A1 origin. 5. Recommend CTA HEAD and neck for further characterization. Acute findings  discussed with and reconfirmed by Dr.Kirkpatrick, Neurology on 11/20/2016 at 2:15 am. Electronically Signed   By: Elon Alas M.D.   On: 11/20/2016 02:45    Mr Brain Wo Contrast   Result Date: 11/20/2016 CLINICAL DATA:  Unsteady gait, dizziness and tinnitus beginning this morning. Suspect stroke. Follow-up potential subarachnoid hemorrhage. EXAM: MRI HEAD WITHOUT CONTRAST MRA HEAD WITHOUT CONTRAST TECHNIQUE: Multiplanar, multiecho pulse sequences of the brain and surrounding structures were obtained without intravenous contrast. Angiographic images of the head were obtained using MRA technique without contrast. COMPARISON:  CT HEAD November 19, 2016 FINDINGS: MRI HEAD FINDINGS BRAIN: Patchy reduced diffusion LEFT insula, LEFT frontal lobe extending to the operculum with low ADC values and faint T2 hyperintense signal. No abnormal intracranial susceptibility artifact. Focal susceptibility artifact and T1 shortening LEFT MCA at the level of the insula. Ventricles and sulci are normal for patient's. No additional levels of with normal signal. No midline shift, mass effect or masses. No abnormal extra-axial fluid collections. VASCULAR: T2 bright signal LEFT internal carotid artery from cervical segment to supraclinoid segment. SKULL AND UPPER CERVICAL SPINE: No abnormal sellar expansion. No suspicious calvarial bone marrow signal. Craniocervical junction maintained. 3 x 2.7 x 1.4 cm (AP by transverse by CC) solid midline frontal scalp mass without calvarial invasion. SINUSES/ORBITS: Chronic LEFT mastoid effusion. Paranasal sinus are well aerated. The included ocular globes and orbital contents are non-suspicious. OTHER: Patient is edentulous. MRA HEAD FINDINGS ANTERIOR CIRCULATION: Complete loss of flow related enhancement LEFT internal carotid artery from the included cervical segment. Thready reconstitution supraclinoid LEFT internal carotid artery. Normal flow related enhancement of the RIGHT included  cervical, petrous, cavernous and supraclinoid internal carotid arteries. Moderate stenosis RIGHT supraclinoid internal carotid artery stenosis. Patent anterior communicating artery, amorphous LEFT A1-2 junction, possible fenestrated versus small aneurysm. Suspected severe stenosis RIGHT A1 origin. Patent presumable retrograde flow LEFT A1 segment. Thready flow related enhancement LEFT middle cerebral artery, occluded LEFT M2 segment. Normal flow related enhancement RIGHT middle cerebral artery. POSTERIOR CIRCULATION: RIGHT vertebral artery is dominant. Basilar artery is patent, with normal flow related enhancement of the main branch vessels. Fenestrated vertebrobasilar junction. Patent posterior cerebral arteries. Posterior communicating artery's not identified. No large vessel occlusion, flow limiting stenosis,  aneurysm. ANATOMIC VARIANTS: None. Source images and MIP images were reviewed. IMPRESSION: MRI HEAD: 1. Multifocal acute small frontal/ LEFT MCA territory nonhemorrhagic infarct. 2. 3 x  2.7 x 1.4 cm solid frontal scalp mass consistent with neoplasm, no calvarial invasion. Recommend non emergent dermatologic consultation. 3. No MR correlate for suspected LEFT frontal lobe subarachnoid hemorrhage ; please note MRI is frequently insensitive to hyperacute subarachnoid hemorrhage. 4. Recommend CTA HEAD and neck for further characterization. MRA HEAD: 1. Occluded LEFT internal carotid artery with thready reconstitution LEFT supraclinoid segment. 2. Slow flow LEFT middle cerebral artery, occluded LEFT M2 segment. 3. Amorphous LEFT A1-2 junction, this could reflect small aneurysm, fenestration or turbulent retrograde flow. 4. Moderate stenosis RIGHT supraclinoid internal carotid artery. Suspected severe stenosis RIGHT A1 origin. 5. Recommend CTA HEAD and neck for further characterization. Acute findings discussed with and reconfirmed by Dr.Kirkpatrick, Neurology on 11/20/2016 at 2:15 am. Electronically Signed   By:  Elon Alas M.D.   On: 11/20/2016 02:45             Medical Problem List and Plan: 1.  Side weakness with speech deficits secondary to left MCA infarction secondary to large vessel disease source of left ICA occlusion 2.  DVT Prophylaxis/Anticoagulation: SCDs. Venous Dopplers negative 3. Pain Management: Tylenol as needed 4. Mood: Provide emotional support 5. Neuropsych: This patient is capable of making decisions on his own behalf. 6. Skin/Wound Care: Routine skin checks 7. Fluids/Electrolytes/Nutrition: Routine I&O's with follow-up chemistries 8. Dysphagia. Dysphagia #3 thin liquids. Follow-up speech therapy 9. Frontal scalp solid large nodule. Follow-up outpatient dermatology 10. CKD stage III. Follow-up chemistries 11. Hyperlipidemia. Lipitor 12. Hypothyroidism. TSH 5.170. Patient on no thyroid medications prior to admission.       Post Admission Physician Evaluation: 1. Functional deficits secondary  to left MCA infarction causing gait disorder and right hemiparesis. 2. Patient is admitted to receive collaborative, interdisciplinary care between the physiatrist, rehab nursing staff, and therapy team. 3. Patient's level of medical complexity and substantial therapy needs in context of that medical necessity cannot be provided at a lesser intensity of care such as a SNF. 4. Patient has experienced substantial functional loss from his/her baseline which was documented above under the "Functional History" and "Functional Status" headings.  Judging by the patient's diagnosis, physical exam, and functional history, the patient has potential for functional progress which will result in measurable gains while on inpatient rehab.  These gains will be of substantial and practical use upon discharge  in facilitating mobility and self-care at the household level. 5. Physiatrist will provide 24 hour management of medical needs as well as oversight of the therapy plan/treatment and provide  guidance as appropriate regarding the interaction of the two. 6. The Preadmission Screening has been reviewed and patient status is unchanged unless otherwise stated above. 7. 24 hour rehab nursing will assist with bladder management, bowel management, safety, skin/wound care, disease management, medication administration, pain management and patient education  and help integrate therapy concepts, techniques,education, etc. 8. PT will assess and treat for/with: pre gait, gait training, endurance , safety, equipment, neuromuscular re education.   Goals are: mod I. 9. OT will assess and treat for/with: ADLs, Cognitive perceptual skills, Neuromuscular re education, safety, endurance, equipment.   Goals are: Mod I. Therapy may proceed with showering this patient. 10. SLP will assess and treat for/with: NA.  Goals are: NA. 11. Case Management and Social Worker will assess and treat for psychological issues and discharge planning. 12. Team conference will be held weekly to assess progress toward goals and to determine barriers to discharge. 13. Patient will receive at least 3 hours of therapy per day at  least 5 days per week. 14. ELOS: 7-10d       15. Prognosis:  excellent         Charlett Blake M.D. [facility] FAAPM&R (Sports Med, Neuromuscular Med) Diplomate Am Board of Electrodiagnostic Med  Cathlyn Parsons., PA-C 11/21/2016

## 2016-11-23 NOTE — Discharge Summary (Signed)
Stroke Discharge Summary  Patient ID: Keith [name]   MRN: [id number]      DOB: Nov 06, 1940  Date of Admission: 11/19/2016 Date of Discharge: 11/23/2016  Attending Physician:  Rosalin Hawking, MD, Stroke MD Consultant(s):  Treatment Team:  Stroke, Md, MD Interventional Radiology, Dr Estanislado Pandy Patient's PCP:  Patient, No Pcp Per, Case Management aware.  Discharge Diagnoses:  Principal Problem:   Left MCA small infarct, embolic likely due to large vessel disease due to left ICA occlusion   SAH (subarachnoid hemorrhage) (HCC) Active Problems:   Stage 3 chronic kidney disease (HCC)   Carotid Artery Stenosis, right   Carotid artery occlusion, left   Hypertension   Hyperlipidemia  Past Medical History:  Diagnosis Date  . Hypothyroidism   . Obstructive uropathy    History reviewed. No pertinent surgical history.  Medications to be continued on Rehab . aspirin EC  81 mg Oral Daily  . atorvastatin  40 mg Oral q1800  . lidocaine        LABORATORY STUDIES CBC    Component Value Date/Time   WBC 5.3 11/22/2016 0616   RBC 3.79 (L) 11/22/2016 0616   HGB 10.9 (L) 11/22/2016 0616   HCT 34.2 (L) 11/22/2016 0616   PLT 152 11/22/2016 0616   MCV 90.2 11/22/2016 0616   MCH 28.8 11/22/2016 0616   MCHC 31.9 11/22/2016 0616   RDW 13.1 11/22/2016 0616   LYMPHSABS 1.6 11/19/2016 1902   MONOABS 0.5 11/19/2016 1902   EOSABS 0.2 11/19/2016 1902   BASOSABS 0.0 11/19/2016 1902   CMP    Component Value Date/Time   NA 138 11/22/2016 0616   K 4.3 11/22/2016 0616   CL 113 (H) 11/22/2016 0616   CO2 22 11/22/2016 0616   GLUCOSE 90 11/22/2016 0616   BUN 19 11/22/2016 0616   CREATININE 2.37 (H) 11/22/2016 0616   CALCIUM 8.3 (L) 11/22/2016 0616   CALCIUM 7.5 (L) 07/11/2010 0400   PROT 7.0 11/19/2016 1902   ALBUMIN 3.5 11/19/2016 1902   AST 15 11/19/2016 1902   ALT 10 (L) 11/19/2016 1902   ALKPHOS 89 11/19/2016 1902   BILITOT 0.5 11/19/2016 1902   GFRNONAA 25 (L) 11/22/2016 0616   GFRAA  29 (L) 11/22/2016 0616   COAGS Lab Results  Component Value Date   INR 0.98 11/19/2016   INR 1.31 07/08/2010   Lipid Panel    Component Value Date/Time   CHOL 183 11/20/2016 0245   TRIG 115 11/20/2016 0245   HDL 35 (L) 11/20/2016 0245   CHOLHDL 5.2 11/20/2016 0245   VLDL 23 11/20/2016 0245   LDLCALC 125 (H) 11/20/2016 0245   HgbA1C  Lab Results  Component Value Date   HGBA1C 5.5 11/20/2016   Urinalysis    Component Value Date/Time   COLORURINE RED (A) 08/14/2010 0658   APPEARANCEUR TURBID (A) 08/14/2010 0658   LABSPEC 1.012 08/14/2010 0658   PHURINE 6.0 08/14/2010 0658   GLUCOSEU NEGATIVE 08/14/2010 0658   HGBUR LARGE (A) 08/14/2010 0658   BILIRUBINUR NEGATIVE 08/14/2010 0658   KETONESUR 15 (A) 08/14/2010 0658   PROTEINUR 100 (A) 08/14/2010 0658   UROBILINOGEN 0.2 08/14/2010 0658   NITRITE NEGATIVE 08/14/2010 0658   LEUKOCYTESUR LARGE (A) 08/14/2010 0658   Urine Drug Screen No results found for: LABOPIA, COCAINSCRNUR, LABBENZ, AMPHETMU, THCU, LABBARB  Alcohol Level No results found for: [facility]   SIGNIFICANT DIAGNOSTIC STUDIES I have personally reviewed the radiological images below and agree with the radiology interpretations.  Ct Head Wo Contrast 11/19/2016 IMPRESSION: 1. Trace LEFT frontal acute to subacute subarachnoid hemorrhage. Recommend MRI of the brain on nonemergent basis. 2. Otherwise negative CT HEAD for age. 3. New 2.5 cm soft tissue mass LEFT frontal scalp concerning for skin cancer. Recommend dermatologic consultation on a nonemergent basis.    Mri and Mra Brain Wo Contrast 11/20/2016 IMPRESSION: MRI HEAD: 1. Multifocal acute small frontal/ LEFT MCA territory nonhemorrhagic infarct. 2. 3 x 2.7 x 1.4 cm solid frontal scalp mass consistent with neoplasm, no calvarial invasion. Recommend non emergent dermatologic consultation. 3. No MR correlate for suspected LEFT frontal lobe subarachnoid hemorrhage ; please note MRI is frequently insensitive to hyperacute  subarachnoid hemorrhage. 4. Recommend CTA HEAD and neck for further characterization. MRA HEAD: 1. Occluded LEFT internal carotid artery with thready reconstitution LEFT supraclinoid segment. 2. Slow flow LEFT middle cerebral artery, occluded LEFT M2 segment. 3. Amorphous LEFT A1-2 junction, this could reflect small aneurysm, fenestration or turbulent retrograde flow. 4. Moderate stenosis RIGHT supraclinoid internal carotid artery. Suspected severe stenosis RIGHT A1 origin. 5. Recommend CTA HEAD and neck for further characterization.   Carotid Doppler  Right 60-79% mid internal carotid artery stenosis, left internal carotid artery appears occluded. Bilateral vertebral arteries appear patent and antegrade.  2D Echocardiogram   Impressions: - Normal LV size with EF 55-60%. Moderate diastolic dysfunction. Normal RV size and systolic function. Mild aortic insufficiency.  LE venous doppler  No evidence of deep vein thrombosis or baker's cystsbilaterally.  Ct Head Wo Contrast 11/21/2016 IMPRESSION: 1. No subarachnoid hemorrhage on today's CT. 2. Evolving nonhemorrhagic small LEFT frontal/MCA territory infarct. 3. 2.6 cm soft tissue mass LEFT frontal scalp, recommend nonemergent dermal a Hologic consultation.     HISTORY OF PRESENT ILLNESS [name] is a 76 y.o. male with no clear history with regular PCP follow up admitted for right side weakness and slurry speech. No tPA given due to out of window and question of SAH.    11/22/16: Pt was scheduled for Cerebral angiogram today - cancelled due to emergency case. Rescheduled for tomorrow 11/23/16. Patients symptoms continue to improve slowly. Continues to complain of hoarseness and Right sided weakness. Plan for CIR in AM after procedure if stable.  11/23/16: Patient returned from Angio in stable condition. Verbalizes no new complaints. Neuro exam stable. Plan for CIR today.  HOSPITAL COURSE Stroke:  left MCA small infarct, embolic  likely secondary to large vessel disease source of left ICA occlusion  Resultant dysarthria and left hemiparesis  CT head trace left frontal SAH  MRI  Left MCA infarct  MRA  Left ICA occlusion  Carotid Doppler  Left ICA occlusion and right ICA 60-79% stenosis  Repeat CT head showed resolution of trace SAH  cerebral arteriogram - left ICA occlusion, right ICA 45% stenosis   2D Echo - EF 55-60%  LE venous doppler - negative  LDL 125  HgbA1c 5.5  SCDs for VTE prophylaxis  Diet NPO time specified Except for: Sips with Meds  Diet heart healthy/carb modified Room service appropriate? Yes; Fluid consistency: Thin   No antithrombotic prior to admission, now on aspirin 81 mg.   Patient counseled to be compliant with his antithrombotic medications  Ongoing aggressive stroke risk factor management  Therapy recommendations:  CIR  Disposition:  CIR  ? SAH  Initial CT concerns trace SAH left high convexity  MRI did not show SAH  Repeat CT 11/21/16 showed resolution of SAH  Carotid artery disease  Pt not  following with doctors, no previous image to compare  CUS and MRA confirmed left ICA occlusion  CUS right ICA 60-79% stenosis  DSA left ICA occlusion, right ICA 45% stenosis  Likely atherosclerosis etiology, less likely embolic  On aspirin 81mg   Hypertension  Stable  Long term BP goal 130-150 due to carotid occlusion/stenosis  Hyperlipidemia  Home meds:  none   LDL 125, goal < 70  Now on lipitor 40  Continue statin at discharge  Other Stroke Risk Factors  Advanced age  Other Active Problems  Enrolled in STROKE AF trial   Not seeing doctors for years  Frontal scalp solid large nodule -outpatient dermatology follow-up   DISCHARGE EXAM Blood pressure (!) 142/41, pulse (!) 49, temperature 97.8 F (36.6 C), temperature source Oral, resp. rate 14, height 5\' 9"  (1.753 m), weight 63.5 kg (140 lb), SpO2 99 %. General - Well nourished, well  developed, in no apparent distress. Respiratory - Lung clear bilaterally. No wheezing Cardiovascular - Regular rate and rhythm with no murmur.  Mental Status -  Level of arousal and orientation to time, place, and person were intact. Language including naming, repetition, comprehension was assessed and found intact, mild dysarthria - improving Fund of Knowledge was assessed and was intact.  Cranial Nerves II - XII - II - Visual field intact OU. III, IV, VI - Extraocular movements intact. V - Facial sensation intact bilaterally. VII - facial symmetric now. VIII - Hearing & vestibular intact bilaterally. X - Palate elevates symmetrically. XI - Chin turning & shoulder shrug intact bilaterally. XII - Tongue protrusion intact.  Motor Strength - The patient's strength was normal in LUE and LLE, however, RUE 5-/5 with slight pronator drift and RLE proximal 5/5 and distal 4/5.  Bulk was normal and fasciculations were absent.   Motor Tone - Muscle tone was assessed at the neck and appendages and was normal. Sensory - Light touch, temperature/pinprick were assessed and were symmetrical.   Coordination - The patient had normal movements in the left hand with no ataxia or dysmetria. Tremor was absent. Gait and Station - deferred.  Discharge Diet  Diet NPO time specified Except for: Sips with Meds liquids  DISCHARGE PLAN  Disposition:  Transfer to [facility] for ongoing PT, OT and ST  aspirin 81 mg daily for secondary stroke prevention.  Recommend ongoing risk factor control by Primary Care Physician at time of discharge from inpatient rehabilitation.  Follow-up PCP in 2 weeks following discharge from rehab. Case Management to assign. PCP to follow Thyroid issues  Follow-up with Dr. Erlinda Hong at [facility] in 6 weeks, office to schedule an appointment.   Follow up outpatient Dermatology for Frontal scalp solid large nodule  Greater than 30 minutes were spent preparing  discharge.  Rosalin Hawking, MD PhD Stroke Neurology 11/23/2016 6:22 PM

## 2016-11-23 NOTE — H&P (View-Only) (Signed)
ELECTROPHYSIOLOGY CONSULT NOTE  Patient ID: Keith [name] MRN: [id number], DOB/AGE: December 10, 1940   Admit date: 11/19/2016 Date of Consult: 11/23/2016  Reason for Consultation: Stroke; referred by Dr Erlinda Hong for consideration of Stroke AF trial  History of Present Illness Keith [name] was admitted on 11/19/2016 with acute CVA.   He has been found to have a left MCA small infact,felt to be secondary to large vessel disease source of the L ICA.  The patient underwent 4V cerebral angiogram by Dr Estanislado Pandy and has been found to have an occluded Lt ICA prox with partial distal reconstitution from the ipsilateral OA.   The patient is making clinical improvement.  EP has been asked to evaluate for placement of an implantable loop recorder to monitor for atrial fibrillation post stroke.  Past Medical History Past Medical History:  Diagnosis Date  . Hypothyroidism   . Obstructive uropathy     Past Surgical History History reviewed. No pertinent surgical history.  Allergies/Intolerances Allergies  Allergen Reactions  . Penicillins Other (See Comments)    unknown   Inpatient Medications . [MAR Hold] aspirin EC  81 mg Oral Daily  . [MAR Hold] atorvastatin  40 mg Oral q1800  . lidocaine       . sodium chloride 75 mL/hr at 11/23/16 1353   Social History Social History   Socioeconomic History  . Marital status: Married    Spouse name: Not on file  . Number of children: Not on file  . Years of education: Not on file  . Highest education level: Not on file  Social Needs  . Financial resource strain: Not on file  . Food insecurity - worry: Not on file  . Food insecurity - inability: Not on file  . Transportation needs - medical: Not on file  . Transportation needs - non-medical: Not on file  Occupational History  . Not on file  Tobacco Use  . Smoking status: Never Smoker  . Smokeless tobacco: Never Used  Substance and Sexual Activity  . Alcohol use: No  . Drug use: No  . Sexual  activity: Not on file  Other Topics Concern  . Not on file  Social History Narrative  . Not on file    Review of Systems All other systems reviewed and are otherwise negative except as noted above.  Physical Exam Blood pressure (!) 154/45, pulse (!) 50, temperature 98.1 F (36.7 C), temperature source Oral, resp. rate 16, height 5\' 9"  (1.753 m), weight 63.5 kg (140 lb), SpO2 100 %.  General: Well developed, well appearing 76 y.o. male in no acute distress. HEENT: skin lesion on scalp noted. EOMs intact. Sclera nonicteric. Oropharynx clear.  Neck: Supple without bruits. No JVD. Lungs: Respirations regular and unlabored, CTA bilaterally. No wheezes, rales or rhonchi. Heart: RRR. S1, S2 present. No murmurs, rub, S3 or S4. Abdomen: Soft, non-tender, non-distended. BS present x 4 quadrants. No hepatosplenomegaly.  Extremities: No clubbing, cyanosis or edema. DP/PT/Radials 2+ and equal bilaterally. Psych: Normal affect. Musculoskeletal: No kyphosis. Skin: Intact. Warm and dry. No rashes or petechiae in exposed areas.   Labs Lab Results  Component Value Date   WBC 4.3 11/23/2016   HGB 11.7 (L) 11/23/2016   HCT 35.9 (L) 11/23/2016   MCV 91.1 11/23/2016   PLT 145 (L) 11/23/2016    Recent Labs  Lab 11/19/16 1902  11/23/16 1100  NA 137   < > 137  K 4.3   < > 5.9*  CL 107   < >  117*  CO2 22   < > 12*  BUN 28*   < > 22*  CREATININE 2.38*   < > 2.09*  CALCIUM 8.9   < > 8.1*  PROT 7.0  --   --   BILITOT 0.5  --   --   ALKPHOS 89  --   --   ALT 10*  --   --   AST 15  --   --   GLUCOSE 133*   < > 70   < > = values in this interval not displayed.   No results for input(s): INR in the last 72 hours.  Radiology/Studies Ct Head Wo Contrast  Result Date: 11/21/2016 CLINICAL DATA:  Follow-up subarachnoid hemorrhage and acute LEFT MCA territory infarct. EXAM: CT HEAD WITHOUT CONTRAST TECHNIQUE: Contiguous axial images were obtained from the base of the skull through the vertex without  intravenous contrast. COMPARISON:  CT HEAD November 19, 2016 and MRI of the head November 20, 2016 FINDINGS: BRAIN: No subarachnoid hemorrhage or extra-axial density present on this CT. Wedge-like hypodensity LEFT frontal lobe extending from operculum to insula. No intraparenchymal hemorrhage, mass effect nor midline shift. The ventricles and sulci are normal for age. Patchy supratentorial white matter hypodensities less than expected for patient's age, though non-specific are most compatible with chronic small vessel ischemic disease. No acute large vascular territory infarcts. No abnormal extra-axial fluid collections. Basal cisterns are patent. VASCULAR: Moderate calcific atherosclerosis of the carotid siphons. SKULL: No skull fracture. 2.6 cm solid mass LEFT frontal vertex scalp without underlying osseous abnormality. SINUSES/ORBITS: Included paranasal sinuses are well-aerated. Under pneumatized LEFT mastoid air cells.The included ocular globes and orbital contents are non-suspicious. Similar enophthalmos. OTHER: None. IMPRESSION: 1. No subarachnoid hemorrhage on today's CT. 2. Evolving nonhemorrhagic small LEFT frontal/MCA territory infarct. 3. 2.6 cm soft tissue mass LEFT frontal scalp, recommend nonemergent dermal a Hologic consultation. Electronically Signed   By: Elon Alas M.D.   On: 11/21/2016 06:04   Ct Head Wo Contrast  Result Date: 11/19/2016 CLINICAL DATA:  Unsteady gait, dizziness and tinnitus beginning this morning. EXAM: CT HEAD WITHOUT CONTRAST TECHNIQUE: Contiguous axial images were obtained from the base of the skull through the vertex without intravenous contrast. COMPARISON:  CT HEAD July 08, 2010 FINDINGS: BRAIN: No intraparenchymal hemorrhage, mass effect nor midline shift. The ventricles and sulci are normal for age. Patchy supratentorial white matter hypodensities less than expected for patient's age, though non-specific are most compatible with chronic small vessel ischemic  disease. No acute large vascular territory infarcts. No abnormal extra-axial fluid collections. Basal cisterns are patent. VASCULAR: Moderate calcific atherosclerosis of the carotid siphons. SKULL: No skull fracture. New 2.6 cm soft tissue solid mass LEFT frontal vertex scalp, no underlying osseous abnormality. SINUSES/ORBITS: Paranasal sinus are well aerated. Under pneumatized LEFT mastoid air cells.The included ocular globes and orbital contents are non-suspicious. Similar enophthalmos. OTHER: Patient is edentulous . IMPRESSION: 1. Trace LEFT frontal acute to subacute subarachnoid hemorrhage. Recommend MRI of the brain on nonemergent basis. 2. Otherwise negative CT HEAD for age. 3. New 2.5 cm soft tissue mass LEFT frontal scalp concerning for skin cancer. Recommend dermatologic consultation on a nonemergent basis. 4. Critical Value/emergent results were called by telephone at the time of interpretation on 11/19/2016 at 11:25 pm to Dr. Dorie Rank , who verbally acknowledged these results. Electronically Signed   By: Elon Alas M.D.   On: 11/19/2016 23:27   Mr Jodene Nam Head Wo Contrast  Result Date: 11/20/2016 CLINICAL DATA:  Unsteady gait, dizziness and tinnitus beginning this morning. Suspect stroke. Follow-up potential subarachnoid hemorrhage. EXAM: MRI HEAD WITHOUT CONTRAST MRA HEAD WITHOUT CONTRAST TECHNIQUE: Multiplanar, multiecho pulse sequences of the brain and surrounding structures were obtained without intravenous contrast. Angiographic images of the head were obtained using MRA technique without contrast. COMPARISON:  CT HEAD November 19, 2016 FINDINGS: MRI HEAD FINDINGS BRAIN: Patchy reduced diffusion LEFT insula, LEFT frontal lobe extending to the operculum with low ADC values and faint T2 hyperintense signal. No abnormal intracranial susceptibility artifact. Focal susceptibility artifact and T1 shortening LEFT MCA at the level of the insula. Ventricles and sulci are normal for patient's. No  additional levels of with normal signal. No midline shift, mass effect or masses. No abnormal extra-axial fluid collections. VASCULAR: T2 bright signal LEFT internal carotid artery from cervical segment to supraclinoid segment. SKULL AND UPPER CERVICAL SPINE: No abnormal sellar expansion. No suspicious calvarial bone marrow signal. Craniocervical junction maintained. 3 x 2.7 x 1.4 cm (AP by transverse by CC) solid midline frontal scalp mass without calvarial invasion. SINUSES/ORBITS: Chronic LEFT mastoid effusion. Paranasal sinus are well aerated. The included ocular globes and orbital contents are non-suspicious. OTHER: Patient is edentulous. MRA HEAD FINDINGS ANTERIOR CIRCULATION: Complete loss of flow related enhancement LEFT internal carotid artery from the included cervical segment. Thready reconstitution supraclinoid LEFT internal carotid artery. Normal flow related enhancement of the RIGHT included cervical, petrous, cavernous and supraclinoid internal carotid arteries. Moderate stenosis RIGHT supraclinoid internal carotid artery stenosis. Patent anterior communicating artery, amorphous LEFT A1-2 junction, possible fenestrated versus small aneurysm. Suspected severe stenosis RIGHT A1 origin. Patent presumable retrograde flow LEFT A1 segment. Thready flow related enhancement LEFT middle cerebral artery, occluded LEFT M2 segment. Normal flow related enhancement RIGHT middle cerebral artery. POSTERIOR CIRCULATION: RIGHT vertebral artery is dominant. Basilar artery is patent, with normal flow related enhancement of the main branch vessels. Fenestrated vertebrobasilar junction. Patent posterior cerebral arteries. Posterior communicating artery's not identified. No large vessel occlusion, flow limiting stenosis,  aneurysm. ANATOMIC VARIANTS: None. Source images and MIP images were reviewed. IMPRESSION: MRI HEAD: 1. Multifocal acute small frontal/ LEFT MCA territory nonhemorrhagic infarct. 2. 3 x 2.7 x 1.4 cm solid  frontal scalp mass consistent with neoplasm, no calvarial invasion. Recommend non emergent dermatologic consultation. 3. No MR correlate for suspected LEFT frontal lobe subarachnoid hemorrhage ; please note MRI is frequently insensitive to hyperacute subarachnoid hemorrhage. 4. Recommend CTA HEAD and neck for further characterization. MRA HEAD: 1. Occluded LEFT internal carotid artery with thready reconstitution LEFT supraclinoid segment. 2. Slow flow LEFT middle cerebral artery, occluded LEFT M2 segment. 3. Amorphous LEFT A1-2 junction, this could reflect small aneurysm, fenestration or turbulent retrograde flow. 4. Moderate stenosis RIGHT supraclinoid internal carotid artery. Suspected severe stenosis RIGHT A1 origin. 5. Recommend CTA HEAD and neck for further characterization. Acute findings discussed with and reconfirmed by Dr.Kirkpatrick, Neurology on 11/20/2016 at 2:15 am. Electronically Signed   By: Elon Alas M.D.   On: 11/20/2016 02:45   Mr Brain Wo Contrast  Result Date: 11/20/2016 CLINICAL DATA:  Unsteady gait, dizziness and tinnitus beginning this morning. Suspect stroke. Follow-up potential subarachnoid hemorrhage. EXAM: MRI HEAD WITHOUT CONTRAST MRA HEAD WITHOUT CONTRAST TECHNIQUE: Multiplanar, multiecho pulse sequences of the brain and surrounding structures were obtained without intravenous contrast. Angiographic images of the head were obtained using MRA technique without contrast. COMPARISON:  CT HEAD November 19, 2016 FINDINGS: MRI HEAD FINDINGS BRAIN: Patchy reduced diffusion LEFT insula, LEFT frontal lobe extending to the operculum with  low ADC values and faint T2 hyperintense signal. No abnormal intracranial susceptibility artifact. Focal susceptibility artifact and T1 shortening LEFT MCA at the level of the insula. Ventricles and sulci are normal for patient's. No additional levels of with normal signal. No midline shift, mass effect or masses. No abnormal extra-axial fluid  collections. VASCULAR: T2 bright signal LEFT internal carotid artery from cervical segment to supraclinoid segment. SKULL AND UPPER CERVICAL SPINE: No abnormal sellar expansion. No suspicious calvarial bone marrow signal. Craniocervical junction maintained. 3 x 2.7 x 1.4 cm (AP by transverse by CC) solid midline frontal scalp mass without calvarial invasion. SINUSES/ORBITS: Chronic LEFT mastoid effusion. Paranasal sinus are well aerated. The included ocular globes and orbital contents are non-suspicious. OTHER: Patient is edentulous. MRA HEAD FINDINGS ANTERIOR CIRCULATION: Complete loss of flow related enhancement LEFT internal carotid artery from the included cervical segment. Thready reconstitution supraclinoid LEFT internal carotid artery. Normal flow related enhancement of the RIGHT included cervical, petrous, cavernous and supraclinoid internal carotid arteries. Moderate stenosis RIGHT supraclinoid internal carotid artery stenosis. Patent anterior communicating artery, amorphous LEFT A1-2 junction, possible fenestrated versus small aneurysm. Suspected severe stenosis RIGHT A1 origin. Patent presumable retrograde flow LEFT A1 segment. Thready flow related enhancement LEFT middle cerebral artery, occluded LEFT M2 segment. Normal flow related enhancement RIGHT middle cerebral artery. POSTERIOR CIRCULATION: RIGHT vertebral artery is dominant. Basilar artery is patent, with normal flow related enhancement of the main branch vessels. Fenestrated vertebrobasilar junction. Patent posterior cerebral arteries. Posterior communicating artery's not identified. No large vessel occlusion, flow limiting stenosis,  aneurysm. ANATOMIC VARIANTS: None. Source images and MIP images were reviewed. IMPRESSION: MRI HEAD: 1. Multifocal acute small frontal/ LEFT MCA territory nonhemorrhagic infarct. 2. 3 x 2.7 x 1.4 cm solid frontal scalp mass consistent with neoplasm, no calvarial invasion. Recommend non emergent dermatologic  consultation. 3. No MR correlate for suspected LEFT frontal lobe subarachnoid hemorrhage ; please note MRI is frequently insensitive to hyperacute subarachnoid hemorrhage. 4. Recommend CTA HEAD and neck for further characterization. MRA HEAD: 1. Occluded LEFT internal carotid artery with thready reconstitution LEFT supraclinoid segment. 2. Slow flow LEFT middle cerebral artery, occluded LEFT M2 segment. 3. Amorphous LEFT A1-2 junction, this could reflect small aneurysm, fenestration or turbulent retrograde flow. 4. Moderate stenosis RIGHT supraclinoid internal carotid artery. Suspected severe stenosis RIGHT A1 origin. 5. Recommend CTA HEAD and neck for further characterization. Acute findings discussed with and reconfirmed by Dr.Kirkpatrick, Neurology on 11/20/2016 at 2:15 am. Electronically Signed   By: Elon Alas M.D.   On: 11/20/2016 02:45    Echocardiogram  11/21/16 echo reveals EF 55%, moderate diastolic dysfunction  44-RXVQ ECG ekgs from 2012 are reviewed (all ekgs in epic).  I do not see afib  Assessment and Plan 1. Stroke The patient is s/p stroke due to CV occlusion.  EP has been asked to evaluate for loop recorder insertion to monitor for AF according to Stroke AF protocol. The indication for loop recorder insertion / monitoring for AF in setting of stroke was discussed with the patient. The loop recorder insertion procedure was reviewed in detail including risks and benefits. These risks include but are not limited to bleeding and infection. The patient expressed verbal understanding and agrees to proceed. The patient was also counseled regarding wound care and device follow-up.  Army Fossa MD 11/23/2016, 2:52 PM

## 2016-11-23 NOTE — Progress Notes (Signed)
Pt returned with dry dressing to right upper chest. Loop recorder place. Pt denies pain or discomfort. Family at bedside. No noted distress. Neuro intact. Call bell within reach. Will continue to monitor.

## 2016-11-23 NOTE — Sedation Documentation (Signed)
Patient is resting comfortably. 

## 2016-11-23 NOTE — Progress Notes (Signed)
Pt transferring to rehab. Report called in to nurse, Bevely Palmer. Pt and family aware taking all personal belongings.

## 2016-11-23 NOTE — Procedures (Signed)
S/P 4 vessel cerebral arteriogram RT CFA approach. Findings. 1.Approx 45 % stenosis RT ICA prox. 2.Occluded Lt ICA prox with partial distal distal reconstituition from ipsilateral OA. 3.Hyervascular mass Lt paratracheal region at C7 T 1 ?thyroid.

## 2016-11-23 NOTE — Progress Notes (Signed)
PT Cancellation Note  Patient Details Name: Keith [name] MRN: [id number] DOB: [birth year]   Cancelled Treatment:    Reason Eval/Treat Not Completed: Other (comment); pt back from angiogram but still on bedrest; will attempt again as schedule permits; pt to go to CIR possibly later today   [facility] 11/23/2016, 11:11 AM

## 2016-11-23 NOTE — Progress Notes (Signed)
Pt leaving unit at this time for loop recorder.  Consent signed. Family at bedside.

## 2016-11-23 NOTE — Progress Notes (Addendum)
Inpatient Rehabilitation  Continuing to follow for timing of medical readiness, I await acute medical clearance prior to hopeful admission today.  Plan to update team as I know.  Call if questions.   Update: I have received acute medical clearance and will proceed with admitting patient today and discussed with team.   Gunnar Fusi, M.A., CCC/SLP Admission [facility]  Cell [phone]

## 2016-11-23 NOTE — Progress Notes (Signed)
Pt returned from procedure at 9:15am alert, verbal with no noted distress. Pt remains flat at this time. Dry gauze dressing to right groin intact. Pt denies pain or discomfort. Family at bedside. Call bell within reach. Will continue to monitor.

## 2016-11-23 NOTE — Interval H&P Note (Signed)
History and Physical Interval Note:  11/23/2016 2:53 PM  [place]  has presented today for surgery, with the diagnosis of research  The various methods of treatment have been discussed with the patient and family. After consideration of risks, benefits and other options for treatment, the patient has consented to  Procedure(s): LOOP RECORDER INSERTION (N/A) as a surgical intervention .  The patient's history has been reviewed, patient examined, no change in status, stable for surgery.  I have reviewed the patient's chart and labs.  Questions were answered to the patient's satisfaction.     [name]

## 2016-11-24 ENCOUNTER — Inpatient Hospital Stay (HOSPITAL_COMMUNITY): Payer: Self-pay | Admitting: Physical Therapy

## 2016-11-24 ENCOUNTER — Inpatient Hospital Stay (HOSPITAL_COMMUNITY): Payer: Self-pay

## 2016-11-24 ENCOUNTER — Inpatient Hospital Stay (HOSPITAL_COMMUNITY): Payer: Self-pay | Admitting: Occupational Therapy

## 2016-11-24 ENCOUNTER — Encounter (HOSPITAL_COMMUNITY): Payer: Self-pay | Admitting: Internal Medicine

## 2016-11-24 DIAGNOSIS — I63512 Cerebral infarction due to unspecified occlusion or stenosis of left middle cerebral artery: Secondary | ICD-10-CM

## 2016-11-24 MED ORDER — TRAMADOL-ACETAMINOPHEN 37.5-325 MG PO TABS
1.0000 | ORAL_TABLET | ORAL | Status: DC | PRN
  Administered 2016-11-24 – 2016-11-25 (×3): 1 via ORAL
  Filled 2016-11-24 (×3): qty 1

## 2016-11-24 NOTE — Evaluation (Signed)
Speech Language Pathology Assessment and Plan  Patient Details  Name: Keith [name] MRN: [id number] Date of Birth: 1940/02/28  SLP Diagnosis: Cognitive Impairments  Rehab Potential: Good ELOS: 5-7 days    Today's Date: 11/24/2016 SLP Individual Time: 1300-1400 SLP Individual Time Calculation (min): 60 min   Problem List:  Patient Active Problem List   Diagnosis Date Noted  . Hemiparesis affecting right side as late effect of cerebrovascular accident (CVA) ([place]) 11/23/2016  . Gait disturbance, post-stroke 11/23/2016  . Left middle cerebral artery stroke ([place]) 11/23/2016  . SAH (subarachnoid hemorrhage) ([place]) 11/20/2016  . Hypothyroidism 11/20/2016  . Neoplasm of skin of scalp 11/20/2016  . CVA (cerebral vascular accident) ([place]) 11/20/2016  . Gait disturbance   . Subarachnoid hemorrhage ([place])   . Dysphagia, post-stroke   . Stage 3 chronic kidney disease ([place])   . Hyperglycemia   . Acute blood loss anemia    Past Medical History:  Past Medical History:  Diagnosis Date  . Hypothyroidism   . Obstructive uropathy    Past Surgical History: History reviewed. No pertinent surgical history.  Assessment / Plan / Recommendation Clinical Impression Patient is a 76 y.o. year old male with recent admission to the hospital on11/04/2016 with headache and some incoordination of the right side.Patient denied any trauma or fall.Noted creatinine 2.38 from latest baseline of 3.20. TSH 5.170. Troponin negative. CT of the head reviewed, showing left SAH. Per report, trace left frontal acute to subacute subarachnoid hemorrhage. MRI showed multifocal acute small frontal left MCA territory nonhemorrhagic infarction. 2.3 x 2.7 x 1.4 cm solid frontal scalp mass consistent with neoplasm.Carotid Doppler was 60-79% mid internal carotid artery stenosis, left internal carotid artery appeared occludedMRA showed occluded left internal carotid artery with thready reconstitution left supraclinoid segment.  repeat cranial CT scan 11/20/2016 showing no subarachnoid hemorrhage on follow-up scan as well as evolving nonhemorrhagic small left frontal MCA territory infarct and a 2.6 cm soft tissue mass left frontal scalp.Patient transferred to CIR on 11/23/2016 .   Pt presented with minimum cognitive linguistic impairments specifically recall of new information, working memory, semi-complex problem solving and word finding skills, which is further supported by a score of 22 out of 30 (16 out of 30) adjust due to vision deficit MOCA version 7.1 ( n=>26.) Pt lived alone prior to hospitalization and managed who medication and finances. Pt demonstrated minimum impairment in speech intelligibility in conversation and is HOH. Pt presents with mild oral impairment due to broken upper and lowe dentures, recommending dys 3 and thin liquids. Pt demonstrated prolonged mastication with regular textured foods and no overt s/s aspirations, however suspected dys 3 diet at home per pt food preferences. Pt would benefit from skilled ST services in order to maximize functional independence and reduce burden of care returning to mod I level with possible follow up with in home health due to short length of stay.    Skilled Therapeutic Interventions          Skilled ST services focused on cognitive skills. SLP facilitated recall of novel information and  word finding skills in divergent naming tasks requiring min a verbal cues. SLP provided min A verbal cues to increase speech intelligibility in conversation and educated pt in speech intelligibility strategies. Pt was left in room with granddaughter and call bell within reach.    SLP Assessment  Patient will need skilled Speech Lanaguage Pathology Services during CIR admission    Recommendations  SLP Diet Recommendations: Thin;Dysphagia 3 (Mech soft)(due to  broken dentures) Liquid Administration via: Cup;Straw Medication Administration: Whole meds with liquid Supervision: Patient  able to self feed Compensations: Slow rate;Small sips/bites Postural Changes and/or Swallow Maneuvers: Seated upright 90 degrees;Upright 30-60 min after meal Oral Care Recommendations: Oral care BID Patient destination: Home Follow up Recommendations: Outpatient SLP    SLP Frequency 3 to 5 out of 7 days   SLP Duration  SLP Intensity  SLP Treatment/Interventions 5-7 days  Minumum of 1-2 x/day, 30 to 90 minutes  Cognitive remediation/compensation;Cueing hierarchy;Functional tasks;Patient/family education    Pain Pain Assessment Pain Assessment: No/denies pain  Prior Functioning Cognitive/Linguistic Baseline: Within functional limits Type of Home: House  Lives With: Alone Available Help at Discharge: Family;Available 24 hours/day Vocation: Full time employment  Function:  Eating Eating   Modified Consistency Diet: Yes(Dys 3 due to broken dentures ) Eating Assist Level: No help, No cues           Cognition Comprehension Comprehension assist level: Follows basic conversation/direction with no assist  Expression   Expression assist level: Expresses basic needs/ideas: With no assist  Social Interaction Social Interaction assist level: Interacts appropriately with others with medication or extra time (anti-anxiety, antidepressant).  Problem Solving Problem solving assist level: Solves basic problems with no assist  Memory Memory assist level: Recognizes or recalls 75 - 89% of the time/requires cueing 10 - 24% of the time   Short Term Goals: Week 1: SLP Short Term Goal 1 (Week 1): STG=LTG due to ELOS  Refer to Care Plan for Long Term Goals  Recommendations for other services: None   Discharge Criteria: Patient will be discharged from SLP if patient refuses treatment 3 consecutive times without medical reason, if treatment goals not met, if there is a change in medical status, if patient makes no progress towards goals or if patient is discharged from hospital.  The  above assessment, treatment plan, treatment alternatives and goals were discussed and mutually agreed upon: by patient and by family  Keith [name]  [facility] 11/24/2016, 2:41 PM

## 2016-11-24 NOTE — Progress Notes (Signed)
Physical Medicine and Rehabilitation Consult Reason for Consult: Decreased functional mobility Referring Physician: Dr.Xu   HPI: Keith [name] is a 76 y.o. right handed male with history of hypothyroidism, CKD stage III as well as a 2.5 cm mass at the top of the skull that he has had for a number of years and monitored. Per chart review patient lives alone. Independent working as a Presenter, broadcasting. One level home. He has a son in the area that can arrange assistance as needed. Presented 11/19/2016 with headache and some incoordination of the right side. Patient denied any trauma or fall. Noted creatinine 2.38 from latest baseline of 3.20. TSH 5.170. Troponin negative. CT of the head reviewed, showing left SAH.  Per report, trace left frontal acute to subacute subarachnoid hemorrhage. MRI showed multifocal acute small frontal left MCA territory nonhemorrhagic infarction. 2.3 x 2.7 x 1.4 cm solid frontal scalp mass consistent with neoplasm. MRA showed occluded left internal carotid artery with thready reconstitution left supraclinoid segment. Moderate stenosis right supraclinoid internal carotid artery. Echocardiogram is pending. Neurology consulted conservative care with workup currently ongoing. Await plan for CT perfusion scan. Currently on dysphagia #2 thin liquid diet. Physical therapy evaluation completed with recommendations of physical medicine rehabilitation consult. Seen in conjunction with PT.    Review of Systems  Constitutional: Negative for chills and fever.  HENT: Negative for hearing loss.   Eyes: Negative for blurred vision.  Respiratory: Negative for cough and shortness of breath.   Cardiovascular: Negative for chest pain, palpitations and leg swelling.  Gastrointestinal: Positive for constipation. Negative for nausea and vomiting.  Genitourinary: Positive for urgency. Negative for dysuria, flank pain and hematuria.  Musculoskeletal: Negative for myalgias.  Skin: Negative for  rash.  Neurological: Positive for dizziness, speech change, focal weakness and headaches. Negative for seizures.  All other systems reviewed and are negative.      Past Medical History:  Diagnosis Date  . Hypothyroidism   . Obstructive uropathy    History reviewed. No pertinent surgical history.      Family History  Problem Relation Age of Onset  . Lung cancer Father    Social History:  reports that  has never smoked. he has never used smokeless tobacco. He reports that he does not drink alcohol or use drugs. Allergies:       Allergies  Allergen Reactions  . Penicillins Other (See Comments)    unknown   No medications prior to admission.    Home: Home Living Family/patient expects to be discharged to:: Private residence Living Arrangements: Alone Available Help at Discharge: Family, Available 24 hours/day Type of Home: House Home Access: Stairs to enter [employer] of Steps: 1 - at pt's house, level entry at son's [place]: One level Bathroom Shower/Tub: Chiropodist: [place]: None  Functional History: Prior Function Level of Independence: Independent Comments: has worked for [employer] for [employer] Status:  Mobility: [place] bed mobility: Needs Assistance Bed Mobility: Supine to Sit Supine to sit: Min assist, HOB elevated General bed mobility comments: HOb 20 degrees, increased time and min assist to fully elevate trunk and scoot to EOB Transfers Overall transfer level: Needs assistance Transfers: Sit to/from Stand Sit to Stand: Min assist General transfer comment: assist for safety, pt able to stand with right lean in sitting and standing and assist for balance Ambulation/Gait Ambulation/Gait assistance: Mod assist Ambulation Distance (Feet): 40 Feet Assistive device: 1 person hand held assist Gait Pattern/deviations: Step-to pattern,  Decreased stance time - right,  Decreased dorsiflexion - right General Gait Details: pt leaning right with decreased foot clearance and stance on RLE. Pt with Left HHA with cues for posture, position and sequence. Chair to follow and limited by fatigue Gait velocity interpretation: Below normal speed for age/gender  ADL:  Cognition: Cognition Overall Cognitive Status: Within Functional Limits for tasks assessed Orientation Level: Oriented X4 Cognition Arousal/Alertness: Awake/alert Behavior During Therapy: WFL for tasks assessed/performed Overall Cognitive Status: Within Functional Limits for tasks assessed Area of Impairment: Safety/judgement Safety/Judgement: Decreased awareness of deficits General Comments: right inattention and forgets RUE without cues to attend to its position and placement  Blood pressure (!) 154/62, pulse 70, temperature 98.9 F (37.2 C), resp. rate (!) 21, height 5\' 9"  (1.753 m), weight 63.5 kg (140 lb), SpO2 97 %. Physical Exam  Constitutional: He is oriented to person, place, and time. He appears well-developed.  Frail  HENT:  Patient with a cystic mass top of the skull that is nontender. Patient stated it had been there for a number of years  Eyes: EOM are normal. Right eye exhibits no discharge. Left eye exhibits no discharge.  Neck: Normal range of motion. Neck supple. No thyromegaly present.  Cardiovascular: Normal rate, regular rhythm and normal heart sounds.  Respiratory: Effort normal and breath sounds normal. No respiratory distress.  GI: Soft. Bowel sounds are normal. He exhibits no distension.  Musculoskeletal: He exhibits no edema or tenderness.  Neurological: He is alert and oriented to person, place, and time.  He followed full commands. Motor: RUE: 4/5 proximal to distal with ataxia RLE: 3+/5 proximal to distal with ataxia LUE: 4+/5  Proximal to distal LLE: 4-/5 proximal to distal Expressive aphasia  Skin: Skin is warm and dry.  Psychiatric: He has a normal mood and  affect. His behavior is normal. Thought content normal.  Assessment/Plan:  Diagnosis: Multifocal stroke  Labs and images independently reviewed. Records reviewed and summated above.  Stroke:  Continue secondary stroke prophylaxis and Risk Factor Modification listed below:  Blood Pressure Management: Continue current medication with prn's with permisive HTN per primary team  Right sided hemiparesis: fit for orthosis to prevent contractures Motor recovery: Fluoxetine  1. Does the need for close, 24 hr/day medical supervision in concert with the patient's rehab needs make it unreasonable for this patient to be served in a less intensive setting? Yes  2. Co-Morbidities requiring supervision/potential complications: dysphagia post-stroke (advance diet as tolerated), hypothyroidism (cont meds, ensure appropriate mood and energy level for therapies), CKD stage III (avoid nephrotoxic meds), 2.5 cm mass at the top of the skull consistent with neoplasm, stable, hyperglycemia (Monitor in accordance with exercise and adjust meds as necessary), HLD (meds), ABLA (transfuse if necessary to ensure appropriate perfusion for increased activity tolerance) 3. Due to safety, disease management, medication administration and patient education, does the patient require 24 hr/day rehab nursing? Yes 4. Does the patient require coordinated care of a physician, rehab nurse, PT (1-2 hrs/day, 5 days/week), OT (1-2 hrs/day, 5 days/week) and SLP (1-2 hrs/day, 5 days/week) to address physical and functional deficits in the context of the above medical diagnosis(es)? Yes Addressing deficits in the following areas: balance, endurance, locomotion, strength, transferring, bathing, dressing, toileting, speech, swallowing and psychosocial support 5. Can the patient actively participate in an intensive therapy program of at least 3 hrs of therapy per day at least 5 days per week? Potentially 6. The potential for patient to make measurable  gains while on inpatient rehab  is excellent 7. Anticipated functional outcomes upon discharge from inpatient rehab are min assist with PT, min assist with OT, modified independent with SLP. 8. Estimated rehab length of stay to reach the above functional goals is: 16-19 days. 9. Anticipated D/C setting: Home 10. Anticipated post D/C treatments: HH therapy and Home excercise program 11. Overall Rehab/Functional Prognosis: good RECOMMENDATIONS:  This patient's condition is appropriate for continued rehabilitative care in the following setting: CIR after completion of medical workup.  Patient has agreed to participate in recommended program. Yes  Note that insurance prior authorization may be required for reimbursement for recommended care.  Comment: Rehab Admissions Coordinator to follow up.  Delice Lesch, MD, ABPMR  Cathlyn Parsons., PA-C  11/20/2016   Revision History                      Routing History

## 2016-11-24 NOTE — Progress Notes (Signed)
PMR Admission Coordinator Pre-Admission Assessment  Patient: Keith [name] is an 76 y.o., male MRN: [id number] DOB: 1940-11-24 Height: 5\' 9"  (175.3 cm) Weight: 63.5 kg (140 lb)                                                                                                                                                  Insurance Information HMO:     PPO:      PCP:      IPA:      80/20:      OTHER:  PRIMARY: Medicare Part A only     Policy#: [id number]      Subscriber: Self CM Name:       Phone#:      Fax#:  Pre-Cert#: Eligible      Employer: Paediatric nurse, security  Benefits:  Phone #: Verified online      Name: Passport One Eff. Date: 09/17/06     Deduct: $1340      Out of Pocket Max: N/A      Life Max: N/A CIR: Facility charges covered; Physician services, DME, and Outpatient therapy not covered due to no Medicare Part B coverage          SNF: Facility charges covered days 1-20; covered 80% days 21-100 Outpatient: No coverage     Co-Pay:  Home Health: 100%      Co-Pay: none DME: No coverage     Co-Pay:  Providers: Patient's choice   SECONDARY: None      Policy#:       Subscriber:  CM Name:       Phone#:      Fax#:  Pre-Cert#:       Employer:  Benefits:  Phone #:      Name:  Eff. Date:      Deduct:       Out of Pocket Max:       Life Max:  CIR:       SNF:  Outpatient:      Co-Pay:  Home Health:       Co-Pay:  DME:      Co-Pay:   Medicaid Application Date:       Case Manager:  Disability Application Date:       Case Worker:   Emergency Contact Information        Contact Information    Name Relation Home Work Keith [name] Other Keith [name] Son [phone]  [phone]     Current Medical History  Patient Admitting Diagnosis: Multifocal stroke  History of Present Illness: Keith A Littleis a 76 y.o.right handed malewithdocumentedhistory of hypothyroidism, CKDstage III,as well as a 2.5 cm mass at the top of the skull that he has had  for a number of years and monitored.Patient on no prescription medications at time of admission.Per  chart review patient lives alone. Independent working as a Presenter, broadcasting for [employer]. One level home.He has a son in the area that can arrange assistance as needed.Presented 11/19/2016 with headache and some incoordination of the right side.Patient denied any trauma or fall.Noted creatinine 2.38 from latest baseline of 3.20. TSH 5.170. Troponin negative. CT of the head reviewed, showing left SAH. Per report, trace left frontal acute to subacute subarachnoid hemorrhage. MRI showed multifocal acute small frontal left MCA territory nonhemorrhagic infarction. 2.3 x 2.7 x 1.4 cm solid frontal scalp mass consistent with neoplasm.Carotid Doppler was 60-79% mid internal carotid artery stenosis, left internal carotid artery appeared occludedMRA showed occluded left internal carotid artery with thready reconstitution left supraclinoid segment. Moderate stenosis right supraclinoid internal carotid artery.Lower extremity Dopplers negative for DVT. Echocardiogram with ejection fraction of 15% grade 2 diastolic dysfunction. Neurology consultedand continue to followwith repeat cranial CT scan 11/20/2016 showing no subarachnoid hemorrhage on follow-up scan as well as evolving nonhemorrhagic small left frontal MCA territory infarct and a 2.6 cm soft tissue mass left frontal scalp.Patient advised to follow-up outpatient with dermatology for evaluation of frontal scalp solid large nodule.Cerebral arteriogram later completed 11/23/2016 showing approximately 45% stenosis right ICA proximal. Occluded left ICA proximal with partial distal reconstitution from ipsilateral OA.Maintained on aspirin for CVA prophylaxis.Currently on dysphagia #3thin liquid diet. Physicaland occupationaltherapy evaluationscompleted with recommendations of physical medicine rehabilitation consult. Patient was admitted for a comprehensive  rehabilitation program 11/23/16.   NIH Total: 2  Past Medical History      Past Medical History:  Diagnosis Date  . Hypothyroidism   . Obstructive uropathy     Family History  family history includes Lung cancer in his father.  Prior Rehab/Hospitalizations:  Has the patient had major surgery during 100 days prior to admission? No  Current Medications   Current Facility-Administered Medications:  .  0.9 %  sodium chloride infusion, , Intravenous, Continuous, Rosalin Hawking, MD, Last Rate: 75 mL/hr at 11/22/16 2024, 1,000 mL at 11/22/16 2024 .  acetaminophen (TYLENOL) tablet 650 mg, 650 mg, Oral, Q6H PRN **OR** acetaminophen (TYLENOL) suppository 650 mg, 650 mg, Rectal, Q6H PRN, Etta Quill, DO .  aspirin EC tablet 81 mg, 81 mg, Oral, Daily, Rosalin Hawking, MD, 81 mg at 11/23/16 1036 .  atorvastatin (LIPITOR) tablet 40 mg, 40 mg, Oral, q1800, Rosalin Hawking, MD, 40 mg at 11/22/16 1710 .  lidocaine (XYLOCAINE) 1 % (with pres) injection, , , ,  .  ondansetron (ZOFRAN) tablet 4 mg, 4 mg, Oral, Q6H PRN **OR** ondansetron (ZOFRAN) injection 4 mg, 4 mg, Intravenous, Q6H PRN, Alcario Drought, Jared M, DO .  senna-docusate (Senokot-S) tablet 1 tablet, 1 tablet, Oral, QHS PRN, Kerney Elbe, MD .  sodium phosphate (FLEET) 7-19 GM/118ML enema 1 enema, 1 enema, Rectal, Daily PRN, Kerney Elbe, MD  Patients Current Diet: Diet heart healthy/carb modified Room service appropriate? 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  He stopped driving 6 months ago due to vision, but lived alone and was fully independent.   Home Assistive Devices / Equipment Home Assistive Devices/Equipment: None Home Equipment:  None  Prior Device Use: Indicate devices/aids used by the patient prior to current illness, exacerbation or injury? None of the above  Prior Functional Level  Prior Function Level of Independence: Independent Comments: Independent with ADLs, IADLs, driving, and has worked for [employer] for [place]: Did the patient need help bathing, dressing, using the toilet or eating? Independent  Indoor Mobility: Did the patient need assistance with walking from room to room (with or without device)? Independent  Stairs: Did the patient need assistance with internal or external stairs (with or without device)? Independent  Functional Cognition: Did the patient need help planning regular tasks such as shopping or remembering to take medications? Independent  Current Functional Level Cognition  Overall Cognitive Status: Within Functional Limits for tasks assessed Orientation Level: Oriented X4 Following Commands: Follows one step commands with increased time Safety/Judgement: Decreased awareness of deficits General Comments: pt with increased awareness of R UE deficits this session     Extremity Assessment (includes Sensation/Coordination)  Upper Extremity Assessment: RUE deficits/detail RUE Deficits / Details: Pt with decreased strength and coorindation of RUE. Pt with poor opposition, grasp strength, and proproception. Pt able to bring RUE to mouth and top of head with Max cues.  RUE Sensation: decreased light touch RUE Coordination: decreased fine motor, decreased gross motor  Lower Extremity Assessment: Defer to PT evaluation RLE Deficits / Details: 3/5     ADLs  Overall ADL's : Needs assistance/impaired Eating/Feeding: Minimal assistance, Sitting Eating/Feeding Details (indicate cue type and reason): supported sitting Grooming: Minimal assistance, Sitting Grooming Details (indicate cue type and reason): pt requiring Min A to correct postural control and  balance. Pt requiring Min A for bilateral tasks and needs increased assistance to incorporate RUE Upper Body Bathing: Moderate assistance, Sitting Lower Body Bathing: Maximal assistance, +2 for physical assistance, Sit to/from stand Upper Body Dressing : Moderate assistance, Sitting Lower Body Dressing: Minimal assistance Lower Body Dressing Details (indicate cue type and reason): pt needing min A for balance while donning B socks Toilet Transfer: Maximal assistance, +2 for physical assistance, Ambulation(Simulated in room) Functional mobility during ADLs: Maximal assistance, +2 for physical assistance, Cueing for sequencing, Cueing for safety General ADL Comments: Pt demonstrating decreased functional performance. Presenting with decreased functional use of R (dominant) hand, inattention to his R side, decreased cognition, and poor balance    Mobility  Overal bed mobility: Modified Independent Bed Mobility: Supine to Sit, Sit to Supine Supine to sit: Modified independent (Device/Increase time) Sit to supine: Modified independent (Device/Increase time) General bed mobility comments: No assist needed. No dizziness sitting EOB.    Transfers  Overall transfer level: Needs assistance Equipment used: None Transfers: Sit to/from Stand Sit to Stand: Min guard General transfer comment: Min guard for safety due to mild unsteadiness in standing.     Ambulation / Gait / Stairs / Wheelchair Mobility  Ambulation/Gait Ambulation/Gait assistance: Min assist, Mod assist Ambulation Distance (Feet): 200 Feet Assistive device: None Gait Pattern/deviations: Step-through pattern, Decreased stride length, Trunk flexed, Decreased step length - right, Decreased step length - left, Narrow base of support General Gait Details: Pt with short shuffling like steps with decreased stance time RLE. Right knee instability noted but no buckling. Tolerated higher level balance challenges. See balance section for  details. Gait velocity: decreased Gait velocity interpretation: Below normal speed for age/gender    Posture / Balance Dynamic Sitting Balance Sitting balance - Comments: pt able to sit 4 min EOB with minguard and max cues for posture and position Static Standing Balance Single Leg Stance - Right Leg: 1 Single Leg Stance - Left Leg: 1 Tandem Stance - Right Leg: 5 Tandem  Stance - Left Leg: 20 Rhomberg - Eyes Opened: 30 Rhomberg - Eyes Closed: 30 Balance Overall balance assessment: Needs assistance Sitting-balance support: Feet supported, No upper extremity supported Sitting balance-[name] Scale: Good Sitting balance - Comments: pt able to sit 4 min EOB with minguard and max cues for posture and position Postural control: Right lateral lean, Posterior lean Standing balance support: During functional activity Standing balance-[name] Scale: Fair Standing balance comment: Able to perform static standing without UE support but requires UE support for dynamic standing.  Single Leg Stance - Right Leg: 1 Single Leg Stance - Left Leg: 1 Tandem Stance - Right Leg: 5 Tandem Stance - Left Leg: 20 Rhomberg - Eyes Opened: 30 Rhomberg - Eyes Closed: 30 High level balance activites: Side stepping, Backward walking, Direction changes, Sudden stops, Head turns, Turns High Level Balance Comments: Tolerated above activities with Min-mod A for balance esp with backward walking, head turns and side stepping. pt with gait deviations when performing activities requiring external support.  Standardized Balance Assessment Standardized Balance Assessment : Dynamic Gait Index Berg Balance Test Sit to Stand: Able to stand without using hands and stabilize independently Standing Unsupported: Able to stand 2 minutes with supervision Sitting with Back Unsupported but Feet Supported on Floor or Stool: Able to sit safely and securely 2 minutes Stand to Sit: Sits safely with minimal use of hands Transfers: Able to  transfer safely, minor use of hands Standing Unsupported with Eyes Closed: Able to stand 10 seconds safely Standing Ubsupported with Feet Together: Able to place feet together independently and stand 1 minute safely From Standing, Reach Forward with Outstretched Arm: Can reach confidently >25 cm (10") From Standing Position, Pick up Object from Floor: Able to pick up shoe safely and easily From Standing Position, Turn to Look Behind Over each Shoulder: Turn sideways only but maintains balance Turn 360 Degrees: Needs close supervision or verbal cueing Standing Unsupported, Alternately Place Feet on Step/Stool: Needs assistance to keep from falling or unable to try Standing Unsupported, One Foot in Front: Loses balance while stepping or standing Standing on One Leg: Unable to try or needs assist to prevent fall Total Score: 38 Dynamic Gait Index Level Surface: Mild Impairment Change in Gait Speed: Mild Impairment Gait with Horizontal Head Turns: Mild Impairment Gait with Vertical Head Turns: Mild Impairment Gait and Pivot Turn: Mild Impairment Step Over Obstacle: Moderate Impairment Step Around Obstacles: Normal Steps: Moderate Impairment Total Score: 15    Special needs/care consideration BiPAP/CPAP: No CPM: No Continuous Drip IV: No Dialysis: No         Life Vest: No Oxygen: No Special Bed: No Trach Size: No Wound Vac (area): No       Skin: Dry and intact                               Bowel mgmt: Continent, last BM 11/21/16 Bladder mgmt: Continent  Diabetic mgmt: [id number]        Previous Home Environment Living Arrangements: Alone Available Help at Discharge: Family, Available 24 hours/day Type of Home: House Home Layout: One level Home Access: Stairs to enter [employer] of Steps: 1 - at pt's house, level entry at [employer] Shower/Tub: Chiropodist: [place]: No  Discharge Living Setting Plans for  Discharge Living Setting: Patient's home, Alone Type of Home at Discharge: House Discharge Home Layout: One level Discharge Home Access: Stairs to enter Entrance  Stairs-Rails: None Entrance Stairs-Number of Steps: 1 Discharge Bathroom Shower/Tub: Tub/shower unit, Curtain Discharge Bathroom Toilet: Standard Discharge Bathroom Accessibility: Yes How Accessible: Accessible via walker Does the patient have any problems obtaining your medications?: No  Social/Family/Support Systems Patient Roles: Parent, Other (Comment)(Grandparent ) Contact Information: Son: [place] [phone] Anticipated Caregiver: Son and family  Anticipated Caregiver's Contact Information: see above  Ability/Limitations of Caregiver: among them they can provide 24/7 Caregiver Availability: 24/7 Discharge Plan Discussed with Primary Caregiver: Yes Is Caregiver In Agreement with Plan?: Yes Does Caregiver/Family have Issues with Lodging/Transportation while Pt is in Rehab?: No  Goals/Additional Needs Patient/Family Goal for Rehab: PT/OT Min A; SLP Mod I  Expected length of stay: 16-19 days  Cultural Considerations: None Dietary Needs: Heart Healthy diet restrictions, Dys.3 textures currently recommended   Equipment Needs: TBD Pt/Family Agrees to Admission and willing to participate: Yes Program Orientation Provided & Reviewed with Pt/Caregiver Including Roles  & Responsibilities: Yes  Barriers to Discharge: Other (comments)(Keith medications and diet restrictions, needs education )  Decrease burden of Care through IP rehab admission: No  Possible need for SNF placement upon discharge: No  Patient Condition: This patient's medical and functional status has changed since the consult dated: 11/20/16 at 12:35pm in which the Rehabilitation Physician determined and documented that the patient's condition is appropriate for intensive rehabilitative care in an inpatient rehabilitation facility. See "History of  Present Illness" (above) for medical update. Functional changes are: Min A transfers and Min-Mod A gait. Patient's medical and functional status update has been discussed with the Rehabilitation physician and patient remains appropriate for inpatient rehabilitation. Will admit to inpatient rehab today.  Preadmission Screen Completed By:  Gunnar Fusi, 11/23/2016 12:02 PM ______________________________________________________________________   Discussed status with Dr. Letta Pate on 11/23/16 at 12:03pm and received telephone approval for admission today.  Admission Coordinator:  Gunnar Fusi, time 12:03pm/Date 11/23/16             Cosigned by: Charlett Blake, MD at 11/23/2016 12:52 PM  Revision History

## 2016-11-24 NOTE — Progress Notes (Addendum)
Subjective/Complaints: Complains of pain at the recorder site last night as per nursing was not relieved by Tylenol. Review of systems denies chest pain, shortness of breath, nausea, vomiting, diarrhea, constipation  Objective: Vital Signs: Blood pressure (!) 135/59, pulse (!) 52, temperature 97.7 F (36.5 C), temperature source Oral, resp. rate 13, height 5' 9"  (1.753 m), weight 63.4 kg (139 lb 13.3 oz), SpO2 98 %. No results found. Results for orders placed or performed during the hospital encounter of 11/23/16 (from the past 72 hour(s))  CBC WITH DIFFERENTIAL     Status: Abnormal   Collection Time: 11/23/16  6:54 PM  Result Value Ref Range   WBC 5.4 4.0 - 10.5 K/uL   RBC 3.93 (L) 4.22 - 5.81 MIL/uL   Hemoglobin 11.4 (L) 13.0 - 17.0 g/dL   HCT 35.5 (L) 39.0 - 52.0 %   MCV 90.3 78.0 - 100.0 fL   MCH 29.0 26.0 - 34.0 pg   MCHC 32.1 30.0 - 36.0 g/dL   RDW 13.0 11.5 - 15.5 %   Platelets 181 150 - 400 K/uL   Neutrophils Relative % 68 %   Neutro Abs 3.7 1.7 - 7.7 K/uL   Lymphocytes Relative 21 %   Lymphs Abs 1.2 0.7 - 4.0 K/uL   Monocytes Relative 8 %   Monocytes Absolute 0.4 0.1 - 1.0 K/uL   Eosinophils Relative 3 %   Eosinophils Absolute 0.2 0.0 - 0.7 K/uL   Basophils Relative 0 %   Basophils Absolute 0.0 0.0 - 0.1 K/uL  Comprehensive metabolic panel     Status: Abnormal   Collection Time: 11/23/16  6:54 PM  Result Value Ref Range   Sodium 137 135 - 145 mmol/L   Potassium 3.9 3.5 - 5.1 mmol/L    Comment: DELTA CHECK NOTED   Chloride 111 101 - 111 mmol/L   CO2 23 22 - 32 mmol/L   Glucose, Bld 104 (H) 65 - 99 mg/dL   BUN 23 (H) 6 - 20 mg/dL   Creatinine, Ser 2.18 (H) 0.61 - 1.24 mg/dL   Calcium 8.4 (L) 8.9 - 10.3 mg/dL   Total Protein 5.8 (L) 6.5 - 8.1 g/dL   Albumin 2.9 (L) 3.5 - 5.0 g/dL   AST 16 15 - 41 U/L   ALT 9 (L) 17 - 63 U/L   Alkaline Phosphatase 68 38 - 126 U/L   Total Bilirubin 0.6 0.3 - 1.2 mg/dL   GFR calc non Af Amer 28 (L) >60 mL/min   GFR calc Af Amer  32 (L) >60 mL/min    Comment: (NOTE) The eGFR has been calculated using the CKD EPI equation. This calculation has not been validated in all clinical situations. eGFR's persistently <60 mL/min signify possible Chronic Kidney Disease.    Anion gap 3 (L) 5 - 15     HEENT: Scalp hemangioma at vertex, non tender Cardio: RRR and no murmur Resp: CTA B/L and unlabored GI: BS positive and non tender , non distended Extremity:  No Edema Skin:   Intact and Other loop recoder site with dressing intact , mild tenderness Neuro: Alert/Oriented, Abnormal Motor 4+ Right deltoid, bi,tri, grip, HF, KE ADF, 5/5 on Left side and Abnormal FMC Ataxic/ dec FMC Musc/Skel:  Other gait shows no evidence of toe drag, decreased arm swing and decreased trunkal rotation Gen NAD   Assessment/Plan: 1. Functional deficits secondary to Left MCA infarct which require 3+ hours per day of interdisciplinary therapy in a comprehensive inpatient rehab setting. Physiatrist is  providing close team supervision and 24 hour management of active medical problems listed below. Physiatrist and rehab team continue to assess barriers to discharge/monitor patient progress toward functional and medical goals. FIM:                               Function - Memory Patient normally able to recall (first 3 days only): Current season, Location of own room, Staff names and faces, That he or she is in a hospital  Medical Problem List and Plan: 1.Side weakness with speech deficitssecondary to left MCA infarction secondary to large vessel disease source of left ICA occlusion Initiate CIR PT OT 2. DVT Prophylaxis/Anticoagulation: SCDs. Venous Dopplers negative 3. Pain Management:Tylenol as needed 4. Mood:Provide emotional support 5. Neuropsych: This patientiscapable of making decisions on hisown behalf. 6. Skin/Wound Care:Routine skin checks, outpatient follow-up with dermatology for hemangioma 7.  Fluids/Electrolytes/Nutrition:Routine I&O's with follow-up chemistries 8.Dysphagia. Dysphagia #3thin liquids. Follow-up speech therapy 9.Frontal scalp solid large nodule. Follow-up outpatient dermatology 10.CKDstage III. Follow-up chemistries 11.Hyperlipidemia. Lipitor 12.Hypothyroidism. TSH 5.170. Patient on no thyroid medications prior to admission. LOS (Days) 1 A FACE TO FACE EVALUATION WAS PERFORMED  [name] 11/24/2016, 8:33 AM

## 2016-11-24 NOTE — Care Management Note (Signed)
Inpatient Rehabilitation Center Individual Statement of Services  Patient Name:  Keith [name]  Date:  11/24/2016  Welcome to the [facility].  Our goal is to provide you with an individualized program based on your diagnosis and situation, designed to meet your specific needs.  With this comprehensive rehabilitation program, you will be expected to participate in at least 3 hours of rehabilitation therapies Monday-Friday, with modified therapy programming on the weekends.  Your rehabilitation program will include the following services:  Physical Therapy (PT), Occupational Therapy (OT), Speech Therapy (ST), 24 hour per day rehabilitation nursing, Case Management (Social Worker), Rehabilitation Medicine, Nutrition Services and Pharmacy Services  Weekly team conferences will be held on Wednesday to discuss your progress.  Your Social Worker will talk with you frequently to get your input and to update you on team discussions.  Team conferences with you and your family in attendance may also be held.  Expected length of stay: 5-7 days Overall anticipated outcome: mod/i level  Depending on your progress and recovery, your program may change. Your Social Worker will coordinate services and will keep you informed of any changes. Your Social Worker's name and contact numbers are listed  below.  The following services may also be recommended but are not provided by the [place]:    [place] will be made to provide these services after discharge if needed.  Arrangements include referral to agencies that provide these services.  Your insurance has been verified to be:  Medicare Part A Your primary doctor is:  None  Pertinent information will be shared with your doctor and your insurance company.  Social Worker:  Ovidio Kin, [place] or  [phone]  Information discussed with and copy given to patient by: Elease Hashimoto, 11/24/2016, 11:48 AM

## 2016-11-24 NOTE — Progress Notes (Signed)
Social Work Assessment and Plan Social Work Assessment and Plan  Patient Details  Name: Keith [name] MRN: [id number] Date of Birth: April 13, 1940  Today's Date: 11/24/2016  Problem List:  Patient Active Problem List   Diagnosis Date Noted  . Hemiparesis affecting right side as late effect of cerebrovascular accident (CVA) ([place]) 11/23/2016  . Gait disturbance, post-stroke 11/23/2016  . Left middle cerebral artery stroke ([place]) 11/23/2016  . SAH (subarachnoid hemorrhage) ([place]) 11/20/2016  . Hypothyroidism 11/20/2016  . Neoplasm of skin of scalp 11/20/2016  . CVA (cerebral vascular accident) ([place]) 11/20/2016  . Gait disturbance   . Subarachnoid hemorrhage ([place])   . Dysphagia, post-stroke   . Stage 3 chronic kidney disease ([place])   . Hyperglycemia   . Acute blood loss anemia    Past Medical History:  Past Medical History:  Diagnosis Date  . Hypothyroidism   . Obstructive uropathy    Past Surgical History: History reviewed. No pertinent surgical history. Social History:  reports that  has never smoked. he has never used smokeless tobacco. He reports that he does not drink alcohol or use drugs.  Family / Support Systems Marital Status: Widow/Widower How Long?: 2 years Patient Roles: Parent, Other (Comment)(gradnfather/employee) Children: Steven-son [phone] Other Supports: granddaughter who is here with him today Anticipated Caregiver: Son, son's girlfriend, grandaughter Ability/Limitations of Caregiver: between them they can provide almost 24 hr Caregiver Availability: Other (Comment)(almost 24 hr care) Family Dynamics: Close knit family pt's wife passed away 2 years ago. Their son and granddaughter are very involved and supportive. All want the best for pt and will do whatever is needed for him.  Social History Preferred language: English Religion: Baptist Cultural Background: No issues Education: [employer] Read: Yes Write: Yes Employment Status:  Employed Name of Employer: [employer] Return to Work Plans: Unsure at this time Freight forwarder Issues: No issues Guardian/Conservator: none-according to MD pt is capable of making his own decisions while here   Abuse/Neglect Abuse/Neglect Assessment Can Be Completed: Yes Physical Abuse: Denies Verbal Abuse: Denies Sexual Abuse: Denies Exploitation of patient/patient's resources: Denies Self-Neglect: Denies  Emotional Status Pt's affect, behavior adn adjustment status: Pt is motivated to recover and return to work if possible, he enjoys his job and is very active with it. He has always been independent and taken care of himself and others he would like to get back to this. Recent Psychosocial Issues: other health issues has no PCP so was not seeing a MD prior to admission Pyschiatric History: No history deferred depression screen due to coping appropriately and talkng aobut his concerns and feelings. he is very upbeat and optimistic regarding his recovery from this stroke. Substance Abuse History: No  issues  Patient / Family Perceptions, Expectations & Goals Pt/Family understanding of illness & functional limitations: Pt and granddaughter have spoken with MD and feel they have a good understanding of his condition and treatment plan. Both are positive about how he will do here on rehab. Premorbid pt/family roles/activities: Father, employee, grandfather, friend, etc Anticipated changes in roles/activities/participation: resume Pt/family expectations/goals: Pt states: " I want to be able to take care of myself by the time I leave here."  Granddaughter states: " We will do whatever he needs [place] to do he would for [place]."  [employer]: None Premorbid Home Care/DME Agencies: None Transportation available at discharge: family pt gave up driving 6 months ago due to vision issues Resource referrals recommended: Support group  (specify)  Discharge  Planning Living Arrangements: Alone Support Systems: Children, Other relatives, Friends/neighbors Type of Residence: Private residence Insurance Resources: Medicare(part A only needs to sign up for part B) Financial Resources: Employment, Radio broadcast assistant Screen Referred: No Living Expenses: Own Money Management: Patient Does the patient have any problems obtaining your medications?: No(was not seeing a MD) Home Management: Son's girlfriend and granddaughter Patient/Family Preliminary Plans: Return home wiht son, son's girlfriend and granddaughter assisting home. They are in and out daily and can be available for needed for 24 hr, although pt woud not like that. He is very independent and self sufficent. Await therapy team's evaluations. Social Work Anticipated Follow Up Needs: HH/OP, Support Group  Clinical Impression Very pleasant gentleman who is motivated and was very active prior to admission. Still working full time as a Personnel officer. Son, son;s girlfriend and granddaughter are very involved and will assist if needed at discharge. Will need to sign up for part B of Medicare so he will have MD coverage and can go to follow up appointments. Will talk to son about this. Work on safe discharge plan.  Elease Hashimoto 11/24/2016, 12:04 PM

## 2016-11-24 NOTE — Evaluation (Signed)
Occupational Therapy Assessment and Plan  Patient Details  Name: Keith [name] MRN: [id number] Date of Birth: 1940-12-27  OT Diagnosis: ataxia, hemiplegia affecting dominant side and muscle weakness (generalized) Rehab Potential: Rehab Potential (ACUTE ONLY): Excellent ELOS: 5-7 days   Today's Date: 11/24/2016 OT Individual Time: 1001-1100 OT Individual Time Calculation (min): 59 min     Problem List:  Patient Active Problem List   Diagnosis Date Noted  . Hemiparesis affecting right side as late effect of cerebrovascular accident (CVA) ([place]) 11/23/2016  . Gait disturbance, post-stroke 11/23/2016  . Left middle cerebral artery stroke ([place]) 11/23/2016  . SAH (subarachnoid hemorrhage) ([place]) 11/20/2016  . Hypothyroidism 11/20/2016  . Neoplasm of skin of scalp 11/20/2016  . CVA (cerebral vascular accident) ([place]) 11/20/2016  . Gait disturbance   . Subarachnoid hemorrhage ([place])   . Dysphagia, post-stroke   . Stage 3 chronic kidney disease ([place])   . Hyperglycemia   . Acute blood loss anemia     Past Medical History:  Past Medical History:  Diagnosis Date  . Hypothyroidism   . Obstructive uropathy    Past Surgical History: History reviewed. No pertinent surgical history.  Assessment & Plan Clinical Impression: Patient is a 76 y.o. year old male with recent admission to the hospital on11/04/2016 with headache and some incoordination of the right side. Patient denied any trauma or fall. Noted creatinine 2.38 from latest baseline of 3.20. TSH 5.170. Troponin negative. CT of the head reviewed, showing left SAH.  Per report, trace left frontal acute to subacute subarachnoid hemorrhage. MRI showed multifocal acute small frontal left MCA territory nonhemorrhagic infarction. 2.3 x 2.7 x 1.4 cm solid frontal scalp mass consistent with neoplasm. Carotid Doppler was 60-79% mid internal carotid artery stenosis, left internal carotid artery appeared occluded MRA showed occluded left internal  carotid artery with thready reconstitution left supraclinoid segment. repeat cranial CT scan 11/20/2016 showing no subarachnoid hemorrhage on follow-up scan as well as evolving nonhemorrhagic small left frontal MCA territory infarct and a 2.6 cm soft tissue mass left frontal scalp.Patient transferred to CIR on 11/23/2016 .    Patient currently requires min with basic self-care skills secondary to muscle weakness, ataxia and decreased coordination and decreased standing balance, decreased postural control, hemiplegia and decreased balance strategies.  Prior to hospitalization, patient could complete BADL with modified independent .  Patient will benefit from skilled intervention to increase independence with basic self-care skills prior to discharge home with care partner.  Anticipate patient will require intermittent supervision and follow up outpatient.  OT - End of Session Endurance Deficit: Yes Endurance Deficit Description: Rest breaks within BADL tasks OT Assessment Rehab Potential (ACUTE ONLY): Excellent OT Patient demonstrates impairments in the following area(s): Balance;Endurance;Motor OT Basic ADL's Functional Problem(s): Eating;Grooming;Bathing;Dressing;Toileting OT Transfers Functional Problem(s): Toilet;Tub/Shower OT Additional Impairment(s): Fuctional Use of Upper Extremity OT Plan OT Intensity: Minimum of 1-2 x/day, 45 to 90 minutes OT Frequency: 5 out of 7 days OT Duration/Estimated Length of Stay: 5-7 days OT Treatment/Interventions: Balance/vestibular training;Community reintegration;Discharge planning;DME/adaptive equipment instruction;Functional mobility training;Neuromuscular re-education;Patient/family education;Self Care/advanced ADL retraining;Therapeutic Activities;Therapeutic Exercise;UE/LE Strength taining/ROM;UE/LE Coordination activities OT Self Feeding Anticipated Outcome(s): mod i OT Basic Self-Care Anticipated Outcome(s): mod i OT Toileting Anticipated Outcome(s):  mod i OT Bathroom Transfers Anticipated Outcome(s): mod i OT Recommendation Patient destination: Home Follow Up Recommendations: Outpatient OT Equipment Recommended: To be determined   Skilled Therapeutic Intervention Initial eval completed with treatment provided to address functional transfers, improved sit<>stand, functional use of R UE, standing tolerance, and  adapted bathing/dressing skills. Pt ambulated to the bathroom for shower with min HHA and no AD. Pt needed min guard A for standing in the shower 2/2 intermittent posterior LOB. Min A for LB dressing 2/2 slight R LE weakness. Provided pt with built up handle for grooming objects. Pt brushed teeth in standing with min guard A, then shaved with min gaud A and built up handle on razor. Pt returned to bed at end of session and left semi-reclined in bed with granddaughter present and needs met.   OT Evaluation Precautions/Restrictions  Precautions Precautions: Fall Restrictions Weight Bearing Restrictions: No Pain   none/denies pain Home Living/Prior Functioning Home Living Living Arrangements: Alone Available Help at Discharge: Family, Available 24 hours/day Type of Home: House Home Access: Stairs to enter [employer] of Steps: 1 - at pt's house, level entry at son's Home Layout: One level Bathroom Shower/Tub: Chiropodist: Standard  Lives With: Alone IADL History Current License: No Mode of Transportation: Family Prior Function  Able to Take Stairs?: Yes Driving: No Vocation: Full time employment Comments: Independent with ADLs, IADLs, and has worked for [employer] for years ADL ADL ADL Comments: Please see functional navigator Vision Baseline Vision/History: Cataracts Patient Visual Report: No change from baseline Vision Assessment?: Vision impaired- to be further tested in functional context Perception  Perception: Within Functional Limits Praxis Praxis:  Intact Cognition Overall Cognitive Status: Within Functional Limits for tasks assessed Arousal/Alertness: Awake/alert Orientation Level: Place;Person;Situation Person: Oriented Place: Oriented Situation: Oriented Year: 2018 Month: November Day of Week: Correct Memory: Appears intact Immediate Memory Recall: Bed;Sock;Blue Memory Recall: Sock;Blue;Bed Memory Recall Sock: Without Cue Memory Recall Blue: Without Cue Memory Recall Bed: With Cue Awareness: Appears intact Problem Solving: Appears intact Safety/Judgment: Appears intact Sensation Sensation Light Touch: Appears Intact Proprioception: Appears Intact Coordination Gross Motor Movements are Fluid and Coordinated: No Fine Motor Movements are Fluid and Coordinated: No Coordination and Movement Description: Mild dysmetria on the R with decreased amplitude of movement.  Finger Nose Finger Test: delayed with decreased Heel Shin Test: [facility].  Motor  Motor Motor: Hemiplegia Motor - Skilled Clinical Observations: Mild R side Hemiplegia UE>LE  Mobility  Transfers Sit to Stand: 4: Min guard  Trunk/Postural Assessment  Cervical Assessment Cervical Assessment: Exceptions to WFL(foreward head) Thoracic Assessment Thoracic Assessment: Within Functional Limits Lumbar Assessment Lumbar Assessment: Within Functional Limits Postural Control Postural Control: Deficits on evaluation Postural Limitations: delayed righting reactions  Balance Balance Balance Assessed: Yes Dynamic Sitting Balance Dynamic Sitting - Level of Assistance: 6: Modified independent (Device/Increase time) Sitting balance - Comments: pt able to sit 4 min EOB with minguard and max cues for posture and position Static Standing Balance Static Standing - Level of Assistance: 5: Stand by assistance Dynamic Standing Balance Dynamic Standing - Level of Assistance: 4: Min assist Extremity/Trunk Assessment RUE Assessment RUE Assessment: Exceptions to [facility] RUE  Strength RUE Overall Strength Comments: Overall 4-/5 LUE Assessment LUE Assessment: Within Functional Limits   See Function Navigator for Current Functional Status.   Refer to Care Plan for Long Term Goals  Recommendations for other services: None    Discharge Criteria: Patient will be discharged from OT if patient refuses treatment 3 consecutive times without medical reason, if treatment goals not met, if there is a change in medical status, if patient makes no progress towards goals or if patient is discharged from hospital.  The above assessment, treatment plan, treatment alternatives and goals were discussed and mutually agreed upon: by patient  Grayland Ormond  S Khaleesi Gruel 11/24/2016, 12:58 PM

## 2016-11-24 NOTE — Evaluation (Signed)
Physical Therapy Assessment and Plan  Patient Details  Name: Keith [name] MRN: [id number] Date of Birth: 05-09-1940  PT Diagnosis: Abnormality of gait and Hemiplegia dominant Rehab Potential: Good ELOS: 5-7 days    Today's Date: 11/24/2016 PT Individual Time: 0805-0900 1448-1856 PT Individual Time Calculation (min): 55 min  32mn   Problem List:  Patient Active Problem List   Diagnosis Date Noted  . Hemiparesis affecting right side as late effect of cerebrovascular accident (CVA) ([place] 11/23/2016  . Gait disturbance, post-stroke 11/23/2016  . Left middle cerebral artery stroke ([place] 11/23/2016  . SAH (subarachnoid hemorrhage) ([place] 11/20/2016  . Hypothyroidism 11/20/2016  . Neoplasm of skin of scalp 11/20/2016  . CVA (cerebral vascular accident) ([place] 11/20/2016  . Gait disturbance   . [place]noid hemorrhage ([place]   . Dysphagia, post-stroke   . Stage 3 chronic kidney disease ([facility]   . Hyperglycemia   . Acute blood loss anemia     Past Medical History:  Past Medical History:  Diagnosis Date  . Hypothyroidism   . Obstructive uropathy    Past Surgical History: History reviewed. No pertinent surgical history.  Assessment & Plan Clinical Impression: Patient is a 76y.o.right handed malewithdocumentedhistory of hypothyroidism, CKDstage IIIas well as a 2.5 cm mass at the top of the skull that he has had for a number of years and monitored.Patient on no prescription medications at time of admission.Per chart review patient lives alone. Independent working as a sPresenter, broadcasting One level home.He has a son in the area that can arrange assistance as needed.Presented 11/19/2016 with headache and some incoordination of the right side.Patient denied any trauma or fall.Noted creatinine 2.38 from latest baseline of 3.20. TSH 5.170. Troponin negative. CT of the head reviewed, showing left SAH. Per report, trace left frontal acute to subacute subarachnoid hemorrhage. MRI showed  multifocal acute small frontal left MCA territory nonhemorrhagic infarction. 2.3 x 2.7 x 1.4 cm solid frontal scalp mass consistent with neoplasm.Carotid Doppler was 60-79% mid internal carotid artery stenosis, left internal carotid artery appeared occludedMRA showed occluded left internal carotid artery with thready reconstitution left supraclinoid segment. Moderate stenosis right supraclinoid internal carotid artery.Lower extremity Dopplers negative for DVT. Echocardiogram with ejection fraction of 631%grade 2 diastolic dysfunction.. Neurology consultedand continue to followwith repeat cranial CT scan 11/20/2016 showing no subarachnoid hemorrhage on follow-up scan as well as evolving nonhemorrhagic small left frontal MCA territory infarct and a 2.6 cm soft tissue mass left frontal scalp.Patient would follow-up outpatient dermatology for evaluation of frontal scalp solid large nodule.Cerebral arteriogram later completed 11/23/2016 showing approximately 45% stenosis right ICA proximal. Occluded left ICA proximal with partial distal reconstitution from ipsilateral OA.Maintained on aspirin for CVA prophylaxis.Currently on dysphagia #3thin liquid diet.  Patient transferred to CIR on 11/23/2016 .   Patient currently requires min with mobility secondary to muscle weakness, decreased coordination and decreased standing balance, hemiplegia and decreased balance strategies.  Prior to hospitalization, patient was independent  with mobility and lived with   in a House home.  Home access is 1 - at pt's house, level entry at son'sStairs to enter.  Patient will benefit from skilled PT intervention to maximize safe functional mobility, minimize fall risk and decrease caregiver burden for planned discharge home alone.  Anticipate patient will benefit from follow up OP at discharge.  PT - End of Session Activity Tolerance: Tolerates 10 - 20 min activity with multiple rests Endurance Deficit: Yes PT  Assessment Rehab Potential (ACUTE/IP ONLY): Good PT Barriers to Discharge:  Inaccessible home environment;Decreased caregiver support PT Patient demonstrates impairments in the following area(s): Balance;Endurance;Motor;Safety PT Transfers Functional Problem(s): Bed Mobility;Bed to Chair;Car;Floor;Furniture PT Locomotion Functional Problem(s): Ambulation PT Plan PT Intensity: Minimum of 1-2 x/day ,45 to 90 minutes PT Frequency: 5 out of 7 days PT Duration Estimated Length of Stay: 5-7 days  PT Treatment/Interventions: Ambulation/gait training;Balance/vestibular training;Community reintegration;Discharge planning;Disease management/prevention;DME/adaptive equipment instruction;Functional electrical stimulation;Functional mobility training;Neuromuscular re-education;Pain management;Patient/family education;Psychosocial support;Skin care/wound management;Splinting/orthotics;Stair training;Therapeutic Activities;Therapeutic Exercise;UE/LE Strength taining/ROM;UE/LE Coordination activities;Visual/perceptual remediation/compensation;Wheelchair propulsion/positioning PT Transfers Anticipated Outcome(s): Mod I  PT Locomotion Anticipated Outcome(s): Ambulatory Mod I with LRAD  PT Recommendation Recommendations for Other Services: Therapeutic Recreation consult Therapeutic Recreation Interventions: Outing/community reintergration;Stress management Follow Up Recommendations: Outpatient PT Patient destination: Home Equipment Recommended: To be determined  Skilled Therapeutic Intervention Pt received sitting EOB and agreeable to PT. PT instructed patient in PT Evaluation and initiated treatment intervention; see below for results. PT educated patient in [place], rehab potential, rehab goals, and discharge recommendations. Patient returned to room and left sitting in [facility] with call bell in reach and all needs met.    Session 2.   Pt received supine in bed and agreeable to PT. Supine>sit transfer with supervision  assist.   Gait training through room with supervision assist for continent bladder clearing in bathroom.   Gait training in hall x 245f with supervision. Dynamic gait training to weave through 4 cones x 6 and over uneven surface 2 x 10 ft with min-supervisoin assist from PT.   Nustep reciprocal movement training x 8 min with min cues for full ROMas well as proper speed to prevent excessive fatigue.   [facility] to [facility] and [facility] and all needs met.          PT Evaluation Precautions/Restrictions Restrictions Weight Bearing Restrictions: No General   Vital SignsTherapy Vitals Temp: 97.7 F (36.5 C) Temp Source: Oral Pulse Rate: (!) 52 Resp: 13 BP: (!) 135/59 Patient Position (if appropriate): Lying Oxygen Therapy SpO2: 98 % O2 Device: Not Delivered Pain Pain Assessment Pain Assessment: 0-10 Pain Score: 0-No pain Home Living/Prior Functioning Home Living Available Help at Discharge: Family;Available 24 hours/day Type of Home: House Home Access: Stairs to enter ECenterPoint Energyof Steps: 1 - at pt's house, level entry at son's Home Layout: One level Bathroom Shower/Tub: TChiropodist Standard Prior Function  Able to Take Stairs?: Yes Driving: No Vocation: Full time employment Comments: Independent with ADLs, IADLs, and has worked for [employer] Within FAdvertising copywriterPraxis Praxis: Intact  Cognition Orientation Level: Oriented X4 Memory: Appears intact Awareness: Appears intact Problem Solving: Appears intact Safety/Judgment: Appears intact Sensation Sensation Light Touch: Appears Intact Proprioception: Appears Intact Coordination Gross Motor Movements are Fluid and Coordinated: No Fine Motor Movements are Fluid and Coordinated: No Coordination and Movement Description: Mild dysmetria on the R with decreased amplitude of  movement.  Finger Nose Finger Test: delayed with decreased Heel Shin [facility]: [facility]  Motor  Motor Motor: Hemiplegia Motor - Skilled Clinical Observations: Mild R side Hemiplegia UE>LE   Mobility Bed Mobility Bed Mobility: Rolling Right;Rolling Left;Supine to Sit;Sit to Supine Rolling Right: 5: Supervision Rolling Left: 5: Supervision Supine to Sit: 5: Supervision Sit to Supine: 5: Supervision Transfers Transfers: Yes Sit to Stand: 4: Min guard Stand Pivot Transfers: 4: Min guard Locomotion  Ambulation Ambulation: Yes Ambulation/Gait Assistance: 4: Min guard Ambulation Distance (Feet): 250 Feet Assistive device: None Gait Gait: Yes  Gait Pattern: Poor foot clearance - right;Decreased step length - right Stairs / Additional Locomotion Stairs: Yes Stairs Assistance: 5: Supervision Stairs Assistance Details: Verbal cues for gait pattern;Verbal cues for safe use of DME/AE;Verbal cues for precautions/safety Stair Management Technique: Two rails Number of Stairs: 12 Height of Stairs: 6 Wheelchair Mobility Wheelchair Mobility: No  Trunk/Postural Assessment  Cervical Assessment Cervical Assessment: Exceptions to WFL(foreward head) Thoracic Assessment Thoracic Assessment: Within Functional Limits Lumbar Assessment Lumbar Assessment: Within Functional Limits Postural Control Postural Control: Deficits on evaluation Postural Limitations: delayed righting reactions  Balance Balance Balance Assessed: Yes Standardized Balance Assessment Standardized Balance Assessment: Berg Balance Test Berg Balance Test Sit to Stand: Able to stand without using hands and stabilize independently Standing Unsupported: Able to stand safely 2 minutes Sitting with Back Unsupported but Feet Supported on Floor or Stool: Able to sit safely and securely 2 minutes Stand to Sit: Sits safely with minimal use of hands Transfers: Able to transfer safely, minor use of hands Standing Unsupported with Eyes  Closed: Able to stand 10 seconds with supervision Standing Ubsupported with Feet Together: Able to place feet together independently and stand for 1 minute with supervision From Standing, Reach Forward with Outstretched Arm: Can reach confidently >25 cm (10") From Standing Position, Pick up Object from Floor: Able to pick up shoe, needs supervision From Standing Position, Turn to Look Behind Over each Shoulder: Looks behind one side only/other side shows less weight shift Turn 360 Degrees: Able to turn 360 degrees safely one side only in 4 seconds or less Standing Unsupported, Alternately Place Feet on Step/Stool: Able to complete 4 steps without aid or supervision Standing Unsupported, One Foot in Front: Able to plae foot ahead of the other independently and hold 30 seconds Standing on One Leg: Tries to lift leg/unable to hold 3 seconds but remains standing independently Total Score: 45 Dynamic Sitting Balance Dynamic Sitting - Level of Assistance: 6: Modified independent (Device/Increase time) Static Standing Balance Static Standing - Level of Assistance: 5: Stand by assistance Dynamic Standing Balance Dynamic Standing - Level of Assistance: 4: Min assist Extremity Assessment      RLE Assessment RLE Assessment: Within Functional Limits(grossly 4+/5 proximal to distal) LLE Assessment LLE Assessment: Within Functional Limits(grossly 4+/5 proximal to distal)   See Function Navigator for Current Functional Status.   Refer to Care Plan for Long Term Goals  Recommendations for other services: Therapeutic Recreation  Stress management and Outing/community reintegration  Discharge Criteria: Patient will be discharged from PT if patient refuses treatment 3 consecutive times without medical reason, if treatment goals not met, if there is a change in medical status, if patient makes no progress towards goals or if patient is discharged from hospital.  The above assessment, treatment plan,  treatment alternatives and goals were discussed and mutually agreed upon: by patient  Lorie Phenix 11/24/2016, 8:54 AM

## 2016-11-25 ENCOUNTER — Inpatient Hospital Stay (HOSPITAL_COMMUNITY): Payer: Self-pay | Admitting: Occupational Therapy

## 2016-11-25 ENCOUNTER — Inpatient Hospital Stay (HOSPITAL_COMMUNITY): Payer: Self-pay | Admitting: Physical Therapy

## 2016-11-25 NOTE — Progress Notes (Signed)
Subjective/Complaints: No issues overnite, denies pain , good PT session this am Review of systems denies chest pain, shortness of breath, nausea, vomiting, diarrhea, constipation  Objective: Vital Signs: Blood pressure 133/66, pulse 64, temperature 97.7 F (36.5 C), temperature source Oral, resp. rate 18, height 5' 9" (1.753 m), weight 63.4 kg (139 lb 13.3 oz), SpO2 94 %. No results found. Results for orders placed or performed during the hospital encounter of 11/23/16 (from the past 72 hour(s))  CBC WITH DIFFERENTIAL     Status: Abnormal   Collection Time: 11/23/16  6:54 PM  Result Value Ref Range   WBC 5.4 4.0 - 10.5 K/uL   RBC 3.93 (L) 4.22 - 5.81 MIL/uL   Hemoglobin 11.4 (L) 13.0 - 17.0 g/dL   HCT 35.5 (L) 39.0 - 52.0 %   MCV 90.3 78.0 - 100.0 fL   MCH 29.0 26.0 - 34.0 pg   MCHC 32.1 30.0 - 36.0 g/dL   RDW 13.0 11.5 - 15.5 %   Platelets 181 150 - 400 K/uL   Neutrophils Relative % 68 %   Neutro Abs 3.7 1.7 - 7.7 K/uL   Lymphocytes Relative 21 %   Lymphs Abs 1.2 0.7 - 4.0 K/uL   Monocytes Relative 8 %   Monocytes Absolute 0.4 0.1 - 1.0 K/uL   Eosinophils Relative 3 %   Eosinophils Absolute 0.2 0.0 - 0.7 K/uL   Basophils Relative 0 %   Basophils Absolute 0.0 0.0 - 0.1 K/uL  Comprehensive metabolic panel     Status: Abnormal   Collection Time: 11/23/16  6:54 PM  Result Value Ref Range   Sodium 137 135 - 145 mmol/L   Potassium 3.9 3.5 - 5.1 mmol/L    Comment: DELTA CHECK NOTED   Chloride 111 101 - 111 mmol/L   CO2 23 22 - 32 mmol/L   Glucose, Bld 104 (H) 65 - 99 mg/dL   BUN 23 (H) 6 - 20 mg/dL   Creatinine, Ser 2.18 (H) 0.61 - 1.24 mg/dL   Calcium 8.4 (L) 8.9 - 10.3 mg/dL   Total Protein 5.8 (L) 6.5 - 8.1 g/dL   Albumin 2.9 (L) 3.5 - 5.0 g/dL   AST 16 15 - 41 U/L   ALT 9 (L) 17 - 63 U/L   Alkaline Phosphatase 68 38 - 126 U/L   Total Bilirubin 0.6 0.3 - 1.2 mg/dL   GFR calc non Af Amer 28 (L) >60 mL/min   GFR calc Af Amer 32 (L) >60 mL/min    Comment: (NOTE) The  eGFR has been calculated using the CKD EPI equation. This calculation has not been validated in all clinical situations. eGFR's persistently <60 mL/min signify possible Chronic Kidney Disease.    Anion gap 3 (L) 5 - 15     HEENT: Scalp hemangioma at vertex, non tender Cardio: RRR and no murmur Resp: CTA B/L and unlabored GI: BS positive and non tender , non distended Extremity:  No Edema Skin:   Intact and Other loop recoder site with dressing intact , mild tenderness Neuro: Alert/Oriented, Abnormal Motor 4+ Right deltoid, bi,tri, grip, HF, KE ADF, 5/5 on Left side and Abnormal FMC Ataxic/ dec FMC Musc/Skel:  Other gait shows no evidence of toe drag, decreased arm swing and decreased trunkal rotation Gen NAD   Assessment/Plan: 1. Functional deficits secondary to Left MCA infarct which require 3+ hours per day of interdisciplinary therapy in a comprehensive inpatient rehab setting. Physiatrist is providing close team supervision and 24 hour management  of active medical problems listed below. Physiatrist and rehab team continue to assess barriers to discharge/monitor patient progress toward functional and medical goals. FIM: Function - Bathing Position: Shower Body parts bathed by patient: Right arm, Left arm, Chest, Abdomen, Front perineal area, Buttocks, Right upper leg, Right lower leg, Left upper leg, Left lower leg Assist Level: Touching or steadying assistance(Pt > 75%)  Function- Upper Body Dressing/Undressing What is the patient wearing?: Pull over shirt/dress Pull over shirt/dress - Perfomed by patient: Thread/unthread left sleeve, Put head through opening, Thread/unthread right sleeve Pull over shirt/dress - Perfomed by helper: Pull shirt over trunk Assist Level: Touching or steadying assistance(Pt > 75%) Function - Lower Body Dressing/Undressing What is the patient wearing?: Underwear, Pants, Non-skid slipper socks Position: Sitting EOB Underwear - Performed by patient:  Thread/unthread left underwear leg, Pull underwear up/down Underwear - Performed by helper: Thread/unthread right underwear leg Pants- Performed by patient: Thread/unthread left pants leg, Pull pants up/down Pants- Performed by helper: Thread/unthread right pants leg Non-skid slipper socks- Performed by helper: Don/doff right sock, Don/doff left sock Assist for footwear: Supervision/touching assist Assist for lower body dressing: Touching or steadying assistance (Pt > 75%)  Function - Toileting Toileting activity did not occur: No continent bowel/bladder event Toileting steps completed by patient: Adjust clothing prior to toileting, Performs perineal hygiene, Adjust clothing after toileting Toileting Assistive Devices: Grab bar or rail Assist level: Touching or steadying assistance (Pt.75%)  Function - Air cabin crew transfer assistive device: Grab bar Assist level to toilet: Touching or steadying assistance (Pt > 75%) Assist level from toilet: Touching or steadying assistance (Pt > 75%)  Function - Chair/bed transfer Chair/bed transfer method: Stand pivot Chair/bed transfer assist level: Touching or steadying assistance (Pt > 75%)  Function - Locomotion: Wheelchair Will patient use wheelchair at discharge?: No Wheelchair activity did not occur: N/A Wheel 50 feet with 2 turns activity did not occur: N/A Wheel 150 feet activity did not occur: N/A Function - Locomotion: Ambulation Assistive device: Cane-straight Max distance: (150 ft) Assist level: Supervision or verbal cues Assist level: Supervision or verbal cues Assist level: Supervision or verbal cues Assist level: Supervision or verbal cues Assist level: Touching or steadying assistance (Pt > 75%)  Function - Comprehension Comprehension: Auditory Comprehension assist level: Follows basic conversation/direction with no assist  Function - Expression Expression: Verbal Expression assist level: Expresses basic  needs/ideas: With no assist  Function - Social Interaction Social Interaction assist level: Interacts appropriately with others - No medications needed.  Function - Problem Solving Problem solving assist level: Solves basic problems with no assist  Function - Memory Memory assist level: Recognizes or recalls 75 - 89% of the time/requires cueing 10 - 24% of the time Patient normally able to recall (first 3 days only): Current season, Location of own room, Staff names and faces, That he or she is in a hospital  Medical Problem List and Plan: 1.Side weakness with speech deficitssecondary to left MCA infarction secondary to large vessel disease source of left ICA occlusion Cont  CIR PT OT 2. DVT Prophylaxis/Anticoagulation: SCDs. Venous Dopplers negative 3. Pain Management:Tylenol as needed 4. Mood:Provide emotional support 5. Neuropsych: This patientiscapable of making decisions on hisown behalf. 6. Skin/Wound Care:Routine skin checks, outpatient follow-up with dermatology for hemangioma 7. Fluids/Electrolytes/Nutrition:Routine I&O's with follow-up chemistries, intake good  D/C IV8.Dysphagia. Dysphagia #3thin liquids. Follow-up speech therapy 9.Frontal scalp solid large nodule. Follow-up outpatient dermatology 10.CKDstage III. Follow-up chemistries GFR at baseline 11.Hyperlipidemia. Lipitor 12.Hypothyroidism. TSH 5.170. Patient on no  thyroid medications prior to admission. LOS (Days) 2 A FACE TO FACE EVALUATION WAS PERFORMED  KIRSTEINS,ANDREW E 11/25/2016, 9:16 AM

## 2016-11-25 NOTE — Progress Notes (Signed)
Occupational Therapy Session Note  Patient Details  Name: Keith [name] MRN: [id number] Date of Birth: February 11, 1940  Today's Date: 11/25/2016 OT Individual Time: (Session 1) 1003-1101 and (Session 2) 3846-6599 OT Individual Time Calculation (min): 58 min and 28 min   Short Term Goals: Week 1:  OT Short Term Goal 1 (Week 1): STG=LTG 2/2 ELOS  Skilled Therapeutic Interventions/Progress Updates:    Session 1 1:1 tx (58 min) Tx focus on activity tolerance, dynamic balance, and Rt NMR during meaningful activity.   Pt greeted supine in bed. Declining bathing/dressing. Requesting to go outdoors. He ambulated with supervision-min guard while pushing w/c. Required max cues to sign himself off of unit at the RN desk due to unresolved cataracts. Pt with decreased Rt FM strength and did not apply enough pressure to paper to make ink visible. He then ambulated to outdoor setting without rest breaks, standing on elevator and pressing controls with Rt hand with cues from OT to assist with visual demands. Once outdoors, pt ambulating without device over uneven surfaces, up/down steep ramp, and over inclined mounds of grass to observe plants (min guard). Pt exhibiting adequate safety awareness when crossing street as needed. Had him prune plants with Rt hand while stooping in mulch. Pt hopping down from curb x1 per request. Required Min A. No LOB. He then ambulated back to unit without rest breaks, pushing the w/c with supervision. Max cues for locating his way back due, once again, to vision only. He returned to room and was left in bed with granddaughter present.   Session 2 1:1 tx (28 min) Tx focus on Rt [facility] via IADL participation.  Pt greeted supine in bed, agreeable to tx. He ambulated with supervision down hallway to dayroom. Pt engaging in penny stacking and palm to finger translations with various coins. Worked on lifting coins up with pincer grasp vs. sliding them off table. Pt visibly challenged by  task but motivated to complete. Max was 7 pennies stacked before collapse. He continues to benefit from Rt [facility] NMR. He then ambulated back to room and was left with granddaughter and all needs.   Therapy Documentation Precautions:  Precautions Precautions: Fall Restrictions Weight Bearing Restrictions: No Pain: No c/o pain during tx    ADL: ADL ADL Comments: Please see functional navigator     See Function Navigator for Current Functional Status.   Therapy/Group: Individual Therapy  Margaretta Chittum A Shelina Luo 11/25/2016, 4:51 PM

## 2016-11-25 NOTE — Progress Notes (Signed)
Physical Therapy Session Note  Patient Details  Name: Keith [name] MRN: [id number] Date of Birth: Jan 02, 1941  Today's Date: 11/25/2016 PT Individual Time: 1518-3437 PT Individual Time Calculation (min): 59 min    Skilled Therapeutic Interventions/Progress Updates:    Session 1:  [phone]:  Session today focused on promoting improved step length during gait with verbal cues and also challenging balance during gait with head turns.  Functional Gait Assessment performed:  13/30 was score.   Results as below:  1.  2 2. 2 3. 1 4. 2 5. 2 6. 0 7. 0 8. 1 9.2 10.1 Pt demonstrates decreased stability with activities requiring increased single limb balance, narrow BOS, ambulation fwd/bwd with and without head turns.  During session today, pt worked on ambulating with large steps, stepping over cones in tandem, picking cones up off floor, ambulation with head turns, nu-step x 8 min.  Following session, pt returned to bed with granddaughter in room and call bell in reach.  Denied pain t/o session.  Initial vitals:  110/46;  HR: 58  Session 2: 1415-1457:  Pt denies pain, however, does report fatigue.  BP prior:  96/79 (pt asymptomatic) and HR: 83 bpm.  Session focused on improving dual tasking during gait with increasing step length and gait speed.  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  Pt also worked on stairs and used b/l handrails to ascend and descend steps.  Pt also worked on single limb stance to improve stability during gait while doing toe taps and standing hip flexion with external cue of lifting LE lap tray.  Pt also performed nu-step x 10 min with B Ue/LE to improve LE strength and endurance.  Following session, pt returned to bed with granddaughter in room and needs met.  Nursing aware of low BP in PM.  Therapy Documentation Precautions:  Precautions Precautions: Fall Restrictions Weight Bearing  Restrictions: No   See Function Navigator for Current Functional Status.   Therapy/Group: Individual Therapy  Marris Frontera Hilario Quarry 11/25/2016, 8:58 AM

## 2016-11-26 ENCOUNTER — Inpatient Hospital Stay (HOSPITAL_COMMUNITY): Payer: Self-pay | Admitting: Occupational Therapy

## 2016-11-26 NOTE — Plan of Care (Signed)
  RH BOWEL ELIMINATION RH STG MANAGE BOWEL WITH ASSISTANCE Description STG Manage Bowel with mod I Assistance.  11/26/2016 1011 - Not Progressing by Malakye Nolden, Renato Gails, RN   RH BOWEL ELIMINATION RH STG MANAGE BOWEL W/MEDICATION W/ASSISTANCE Description STG Manage Bowel with Medication with mod I Assistance.  11/26/2016 1011 - Not Progressing by Hillery Jacks, RN  LBM 11/8- To give sorbitol this afternoon if no results

## 2016-11-26 NOTE — Plan of Care (Signed)
  Consults RH STROKE PATIENT EDUCATION Description See Patient Education module for education specifics  11/26/2016 0519 - Progressing by Cornell Barman, RN   RH BOWEL ELIMINATION RH STG MANAGE BOWEL WITH ASSISTANCE Description STG Manage Bowel with mod I Assistance.  11/26/2016 0519 - Progressing by Cornell Barman, RN   RH BOWEL ELIMINATION RH STG MANAGE BOWEL W/MEDICATION W/ASSISTANCE Description STG Manage Bowel with Medication with mod I Assistance.  11/26/2016 0519 - Progressing by Cornell Barman, RN   RH BLADDER ELIMINATION RH STG MANAGE BLADDER WITH ASSISTANCE Description STG Manage Bladder With min Assistance  11/26/2016 0519 - Progressing by Cornell Barman, RN   RH BLADDER ELIMINATION RH STG MANAGE BLADDER WITH MEDICATION WITH ASSISTANCE Description STG Manage Bladder With Medication With min Assistance.  11/26/2016 0519 - Progressing by Cornell Barman, RN   RH SKIN INTEGRITY RH STG SKIN FREE OF INFECTION/BREAKDOWN 11/26/2016 2244 - Progressing by Cornell Barman, RN   RH SKIN INTEGRITY RH STG MAINTAIN SKIN INTEGRITY WITH ASSISTANCE Description STG Maintain Skin Integrity With min Assistance.  11/26/2016 0519 - Progressing by Cornell Barman, RN

## 2016-11-26 NOTE — IPOC Note (Signed)
Overall Plan of Care [facility]) Patient Details Name: Keith [name] MRN: [id number] DOB: 11-02-1940  Admitting Diagnosis: Hemiparesis affecting right side as late effect of cerebrovascular accident (CVA) [facility])  Hospital Problems: Principal Problem:   Hemiparesis affecting right side as late effect of cerebrovascular accident (CVA) ([place]) Active Problems:   Gait disturbance, post-stroke   Left middle cerebral artery stroke ([place])     Functional Problem List: Nursing Medication Management, Motor, Endurance, Nutrition, Safety  PT Balance, Endurance, Motor, Safety  OT Balance, Endurance, Motor  SLP    TR         Basic ADL's: OT Eating, Grooming, Bathing, Dressing, Toileting     Advanced  ADL's: OT       Transfers: PT Bed Mobility, Bed to Chair, Car, Floor, Manufacturing systems engineer, Tub/Shower     Locomotion: PT Ambulation     Additional Impairments: OT Fuctional Use of Upper Extremity  SLP Swallowing, Social Cognition   Problem Solving, Memory  TR      Anticipated Outcomes Item Anticipated Outcome  Self Feeding mod i  Swallowing      Basic self-care  mod i  Toileting  mod i   Bathroom Transfers mod i  Bowel/Bladder  Remain continent; no S/S infection, no retention  Transfers  Mod I   Locomotion  Ambulatory Mod I with LRAD   Communication  Mod I  Cognition  Mod I  Pain  Managed at goal 2/10  Safety/Judgment  Increased safety awareness; no falls, injury this admission   Therapy Plan: PT Intensity: Minimum of 1-2 x/day ,45 to 90 minutes PT Frequency: 5 out of 7 days PT Duration Estimated Length of Stay: 5-7 days  OT Intensity: Minimum of 1-2 x/day, 45 to 90 minutes OT Frequency: 5 out of 7 days OT Duration/Estimated Length of Stay: 5-7 days SLP Intensity: Minumum of 1-2 x/day, 30 to 90 minutes SLP Frequency: 3 to 5 out of 7 days SLP Duration/Estimated Length of Stay: 5-7 days    Team Interventions: Nursing Interventions Patient/Family Education,  Disease Management/Prevention, Discharge Planning, Psychosocial Support, Medication Management  PT interventions Ambulation/gait training, Training and development officer, Community reintegration, Discharge planning, Disease management/prevention, DME/adaptive equipment instruction, Functional electrical stimulation, Functional mobility training, Neuromuscular re-education, Pain management, Patient/family education, Psychosocial support, Skin care/wound management, Splinting/orthotics, Stair training, Therapeutic Activities, Therapeutic Exercise, UE/LE Strength taining/ROM, UE/LE Coordination activities, Visual/perceptual remediation/compensation, Wheelchair propulsion/positioning  OT Interventions Training and development officer, Academic librarian, Discharge planning, Engineer, drilling, Functional mobility training, Neuromuscular re-education, Patient/family education, Self Care/advanced ADL retraining, Therapeutic Activities, Therapeutic Exercise, UE/LE Strength taining/ROM, UE/LE Coordination activities  SLP Interventions Cognitive remediation/compensation, Cueing hierarchy, Functional tasks, Patient/family education  TR Interventions    SW/CM Interventions Discharge Planning, Psychosocial Support, Patient/Family Education   Barriers to Discharge MD  Home enviroment access/loayout  Nursing      PT Inaccessible home environment, Decreased caregiver support    OT      SLP Decreased caregiver support    SW       Team Discharge Planning: Destination: PT-Home ,OT- Home , SLP-Home Projected Follow-up: PT-Outpatient PT, OT-  Outpatient OT, SLP-Outpatient SLP Projected Equipment Needs: PT-To be determined, OT- To be determined, SLP-  Equipment Details: PT- , OT-  Patient/family involved in discharge planning: PT- Patient,  OT-Patient, SLP-Patient, Family member/caregiver  MD ELOS: 7d Medical Rehab Prognosis:  Excellent Assessment: 76 yo male with Left MCA infarct with gait  disorder affecting ADLs and mobility, fine motor def in UE.  Now requiring 24/7 Rehab RN,MD, as  well as CIR level PT, OT and SLP.  Treatment team will focus on ADLs and mobility with goals set at Mod I   See Team Conference Notes for weekly updates to the plan of care

## 2016-11-26 NOTE — Progress Notes (Signed)
Occupational Therapy Session Note  Patient Details  Name: Keith [name] MRN: [id number] Date of Birth: November 22, 1940  Today's Date: 11/26/2016 OT Individual Time: 1300-1401 OT Individual Time Calculation (min): 61 min    Short Term Goals: Week 1:  OT Short Term Goal 1 (Week 1): STG=LTG 2/2 ELOS  Skilled Therapeutic Interventions/Progress Updates:    Tx focus on higher level balance, functional transfers, Rt NMR, and activity tolerance during self care tasks.   Pt greeted supine in bed with granddaughter present. Agreeable to shower. Pt retrieving necessary items from room while ambulating with supervision and no AD. Pt pushing w/c down to tub shower room with his ADL items. Educated him on TTB for use at home and transfer technique. Pt transferring to TTB and bathing with supervision at sit<stand level. Pt standing >75% of the time. He dressed in chair placed at sink with min cues to complete LB tasks while seated. Pt donning overhead shirt in standing without LOB. He then ambulated back to room in manner as written above. He shaved and completed oral care while standing (dycem applied to built up razor due to pt report of it slipping out of hand). He ambulated down hallway and carried clean linen back to room with supervision. Pt engaging in bedmaking, bending out of base of support while applying fitted sheet and tucking in blankets. Rt NMR with unfolding linen and doffing/donning pillowcases in standing. Pt left with granddaughter at session exit.   Therapy Documentation Precautions:  Precautions Precautions: Fall Restrictions Weight Bearing Restrictions: No Vital Signs: Therapy Vitals Temp: 97.7 F (36.5 C) Temp Source: Oral Pulse Rate: 74 Resp: 20 BP: (!) 122/52 Patient Position (if appropriate): Lying Oxygen Therapy SpO2: 98 % O2 Device: Not Delivered Pain: No c/o pain during tx   ADL: ADL ADL Comments: Please see functional navigator     See Function Navigator for  Current Functional Status.  Therapy/Group: Individual Therapy  [name] A [name] 11/26/2016, 3:29 PM

## 2016-11-26 NOTE — Progress Notes (Signed)
Subjective/Complaints: No issues overnite, denies pain , good PT session this am Review of systems denies chest pain, shortness of breath, nausea, vomiting, diarrhea, constipation  Objective: Vital Signs: Blood pressure (!) 125/42, pulse 60, temperature 98.6 F (37 C), temperature source Oral, resp. rate 18, height 5' 9"  (1.753 m), weight 63.4 kg (139 lb 13.3 oz), SpO2 98 %. No results found. Results for orders placed or performed during the hospital encounter of 11/23/16 (from the past 72 hour(s))  CBC WITH DIFFERENTIAL     Status: Abnormal   Collection Time: 11/23/16  6:54 PM  Result Value Ref Range   WBC 5.4 4.0 - 10.5 K/uL   RBC 3.93 (L) 4.22 - 5.81 MIL/uL   Hemoglobin 11.4 (L) 13.0 - 17.0 g/dL   HCT 35.5 (L) 39.0 - 52.0 %   MCV 90.3 78.0 - 100.0 fL   MCH 29.0 26.0 - 34.0 pg   MCHC 32.1 30.0 - 36.0 g/dL   RDW 13.0 11.5 - 15.5 %   Platelets 181 150 - 400 K/uL   Neutrophils Relative % 68 %   Neutro Abs 3.7 1.7 - 7.7 K/uL   Lymphocytes Relative 21 %   Lymphs Abs 1.2 0.7 - 4.0 K/uL   Monocytes Relative 8 %   Monocytes Absolute 0.4 0.1 - 1.0 K/uL   Eosinophils Relative 3 %   Eosinophils Absolute 0.2 0.0 - 0.7 K/uL   Basophils Relative 0 %   Basophils Absolute 0.0 0.0 - 0.1 K/uL  Comprehensive metabolic panel     Status: Abnormal   Collection Time: 11/23/16  6:54 PM  Result Value Ref Range   Sodium 137 135 - 145 mmol/L   Potassium 3.9 3.5 - 5.1 mmol/L    Comment: DELTA CHECK NOTED   Chloride 111 101 - 111 mmol/L   CO2 23 22 - 32 mmol/L   Glucose, Bld 104 (H) 65 - 99 mg/dL   BUN 23 (H) 6 - 20 mg/dL   Creatinine, Ser 2.18 (H) 0.61 - 1.24 mg/dL   Calcium 8.4 (L) 8.9 - 10.3 mg/dL   Total Protein 5.8 (L) 6.5 - 8.1 g/dL   Albumin 2.9 (L) 3.5 - 5.0 g/dL   AST 16 15 - 41 U/L   ALT 9 (L) 17 - 63 U/L   Alkaline Phosphatase 68 38 - 126 U/L   Total Bilirubin 0.6 0.3 - 1.2 mg/dL   GFR calc non Af Amer 28 (L) >60 mL/min   GFR calc Af Amer 32 (L) >60 mL/min    Comment:  (NOTE) The eGFR has been calculated using the CKD EPI equation. This calculation has not been validated in all clinical situations. eGFR's persistently <60 mL/min signify possible Chronic Kidney Disease.    Anion gap 3 (L) 5 - 15     HEENT: Scalp hemangioma at vertex, non tender Cardio: RRR and no murmur Resp: CTA B/L and unlabored GI: BS positive and non tender , non distended Extremity:  No Edema, weak pedal pulse but normal temp Skin:   Intact and Other loop recoder site with dressing intact , mild tenderness Neuro: Alert/Oriented, Abnormal Motor 4+ Right deltoid, bi,tri, grip, HF, KE ADF, 5/5 on Left side and Abnormal FMC Ataxic/ dec FMC Musc/Skel:  Other gait shows no evidence of toe drag, decreased arm swing and decreased trunkal rotation, decreased Right hallux ROM Gen NAD   Assessment/Plan: 1. Functional deficits secondary to Left MCA infarct which require 3+ hours per day of interdisciplinary therapy in a comprehensive inpatient rehab  setting. Physiatrist is providing close team supervision and 24 hour management of active medical problems listed below. Physiatrist and rehab team continue to assess barriers to discharge/monitor patient progress toward functional and medical goals. FIM: Function - Bathing Position: Shower Body parts bathed by patient: Right arm, Left arm, Chest, Abdomen, Front perineal area, Buttocks, Right upper leg, Right lower leg, Left upper leg, Left lower leg Assist Level: Touching or steadying assistance(Pt > 75%)  Function- Upper Body Dressing/Undressing What is the patient wearing?: Pull over shirt/dress Pull over shirt/dress - Perfomed by patient: Thread/unthread left sleeve, Put head through opening, Thread/unthread right sleeve Pull over shirt/dress - Perfomed by helper: Pull shirt over trunk Assist Level: Touching or steadying assistance(Pt > 75%) Function - Lower Body Dressing/Undressing What is the patient wearing?: Underwear, Pants,  Non-skid slipper socks Position: Sitting EOB Underwear - Performed by patient: Thread/unthread left underwear leg, Pull underwear up/down Underwear - Performed by helper: Thread/unthread right underwear leg Pants- Performed by patient: Thread/unthread left pants leg, Pull pants up/down Pants- Performed by helper: Thread/unthread right pants leg Non-skid slipper socks- Performed by helper: Don/doff right sock, Don/doff left sock Assist for footwear: Supervision/touching assist Assist for lower body dressing: Touching or steadying assistance (Pt > 75%)  Function - Toileting Toileting activity did not occur: No continent bowel/bladder event Toileting steps completed by patient: Adjust clothing prior to toileting, Performs perineal hygiene, Adjust clothing after toileting Toileting Assistive Devices: Grab bar or rail Assist level: Touching or steadying assistance (Pt.75%)  Function - Air cabin crew transfer assistive device: Grab bar Assist level to toilet: Touching or steadying assistance (Pt > 75%) Assist level from toilet: Touching or steadying assistance (Pt > 75%)  Function - Chair/bed transfer Chair/bed transfer method: Stand pivot Chair/bed transfer assist level: Touching or steadying assistance (Pt > 75%)  Function - Locomotion: Wheelchair Will patient use wheelchair at discharge?: No Wheelchair activity did not occur: N/A Wheel 50 feet with 2 turns activity did not occur: N/A Wheel 150 feet activity did not occur: N/A Function - Locomotion: Ambulation Assistive device: Cane-straight Max distance: (150 ft) Assist level: Supervision or verbal cues Assist level: Supervision or verbal cues Assist level: Supervision or verbal cues Assist level: Supervision or verbal cues Assist level: Touching or steadying assistance (Pt > 75%)  Function - Comprehension Comprehension: Auditory Comprehension assist level: Follows basic conversation/direction with no assist  Function  - Expression Expression: Verbal Expression assist level: Expresses basic needs/ideas: With extra time/assistive device  Function - Social Interaction Social Interaction assist level: Interacts appropriately with others with medication or extra time (anti-anxiety, antidepressant).  Function - Problem Solving Problem solving assist level: Solves basic problems with no assist  Function - Memory Memory assist level: Recognizes or recalls 75 - 89% of the time/requires cueing 10 - 24% of the time Patient normally able to recall (first 3 days only): Staff names and faces, That he or she is in a hospital, Current season  Medical Problem List and Plan: 1.right Side weakness secondary to left MCA infarction secondary to large vessel disease source of left ICA occlusion Cont  CIR PT OT 2. DVT Prophylaxis/Anticoagulation: SCDs. Venous Dopplers negative 3. Pain Management:Tylenol as needed, RIght toe pain, question etiology, hallux rigidus, encourage proper shoe wear 4. Mood:Provide emotional support 5. Neuropsych: This patientiscapable of making decisions on hisown behalf. 6. Skin/Wound Care:Routine skin checks, outpatient follow-up with dermatology for hemangioma 7. Fluids/Electrolytes/Nutrition:Routine I&O's with follow-up chemistries, intake good  D/C IV8.Dysphagia. Dysphagia #3thin liquids. Follow-up speech therapy 9.Frontal scalp  solid large nodule. Follow-up outpatient dermatology 10.CKDstage III. Follow-up chemistries GFR at baseline 11.Hyperlipidemia. Lipitor 12.Hypothyroidism. TSH 5.170. Patient on no thyroid medications prior to admission. LOS (Days) 3 A FACE TO FACE EVALUATION WAS PERFORMED  Keith [name] E 11/26/2016, 8:43 AM

## 2016-11-27 ENCOUNTER — Inpatient Hospital Stay (HOSPITAL_COMMUNITY): Payer: Self-pay | Admitting: Occupational Therapy

## 2016-11-27 ENCOUNTER — Inpatient Hospital Stay (HOSPITAL_COMMUNITY): Payer: Medicare Other | Admitting: Speech Pathology

## 2016-11-27 ENCOUNTER — Inpatient Hospital Stay (HOSPITAL_COMMUNITY): Payer: Self-pay | Admitting: Physical Therapy

## 2016-11-27 NOTE — Plan of Care (Signed)
  Not Progressing RH BOWEL ELIMINATION RH STG MANAGE BOWEL WITH ASSISTANCE Description STG Manage Bowel with mod I Assistance.  11/27/2016 0105 - Not Progressing by Cornell Barman, RN RH STG MANAGE BOWEL W/MEDICATION W/ASSISTANCE Description STG Manage Bowel with Medication with mod I Assistance.  11/27/2016 0105 - Not Progressing by Cornell Barman, RN  No BM> 3 days, no results from PRN sorbitol

## 2016-11-27 NOTE — Progress Notes (Signed)
Speech Language Pathology Daily Session Note  Patient Details  Name: Keith [name] MRN: [id number] Date of Birth: [birth year]  Today's Date: 11/27/2016 SLP Individual Time: 0730-0830 SLP Individual Time Calculation (min): 60 min  Short Term Goals: Week 1: SLP Short Term Goal 1 (Week 1): STG=LTG due to ELOS SLP Short Term Goal 2 (Week 1): Pt will utilize speech intellgibility strategies in conversation with supervision A vebral cues to achieve 95% intelligibility.  SLP Short Term Goal 3 (Week 1): Pt will complete semi-complex tasks with supervision A verbal and question cues for functional problem solving. SLP Short Term Goal 4 (Week 1): Pt will utilize word finding strategies during functional tasks with supervision A verbal and question cues.   Skilled Therapeutic Interventions: Skilled treatment session focused on cognition goals.SLP facilitated session by providing extra time to allow for visual impairments to complete semi-complex medication and money management tasks. Pt with good cognitive abilities and didn't require support for cognition - only for visual deficits. Pt was returned to room,left upright in wheelchair with all needs within reach. Continue per current plan of care.      Function:  Eating Eating   Modified Consistency Diet: Yes Eating Assist Level: No help, No cues           Cognition Comprehension Comprehension assist level: Follows complex conversation/direction with extra time/assistive device  Expression   Expression assist level: Expresses complex ideas: With extra time/assistive device  Social Interaction Social Interaction assist level: Interacts appropriately with others with medication or extra time (anti-anxiety, antidepressant).  Problem Solving Problem solving assist level: Solves complex problems: With extra time  Memory Memory assist level: Recognizes or recalls 90% of the time/requires cueing < 10% of the time    Pain    Therapy/Group:  Individual Therapy  Beverley Sherrard 11/27/2016, 8:31 AM

## 2016-11-27 NOTE — Progress Notes (Signed)
Subjective/Complaints: No issues overnite  Worked with speech therapy on Monday management as well as medication management.  He states that he was able to count out change correctly and place pills in a medication organizer  Review of systems denies chest pain, shortness of breath, nausea, vomiting, diarrhea, constipation  Objective: Vital Signs: Blood pressure (!) 115/59, pulse 60, temperature 98.1 F (36.7 C), temperature source Oral, resp. rate 18, height 5\' 9"  (1.753 m), weight 63.4 kg (139 lb 13.3 oz), SpO2 99 %. No results found. No results found for this or any previous visit (from the past 72 hour(s)).   HEENT: Scalp hemangioma at vertex, non tender Cardio: RRR and no murmur Resp: CTA B/L and unlabored GI: BS positive and non tender , non distended Extremity:  No Edema, weak pedal pulse but normal temp Skin:   Intact and Other loop recoder site with dressing intact , mild tenderness Neuro: Alert/Oriented, Abnormal Motor 4+ Right deltoid, bi,tri, grip, HF, KE ADF, 5/5 on Left side and Abnormal FMC Ataxic/ dec FMC Musc/Skel:  Other gait shows no evidence of toe drag, decreased arm swing and decreased trunkal rotation, decreased Right hallux ROM Gen NAD   Assessment/Plan: 1. Functional deficits secondary to Left MCA infarct which require 3+ hours per day of interdisciplinary therapy in a comprehensive inpatient rehab setting. Physiatrist is providing close team supervision and 24 hour management of active medical problems listed below. Physiatrist and rehab team continue to assess barriers to discharge/monitor patient progress toward functional and medical goals. FIM: Function - Bathing Position: Shower Body parts bathed by patient: Right arm, Left arm, Chest, Abdomen, Front perineal area, Buttocks, Right upper leg, Right lower leg, Left upper leg, Left lower leg Assist Level: Supervision or verbal cues  Function- Upper Body Dressing/Undressing What is the patient wearing?:  Pull over shirt/dress Pull over shirt/dress - Perfomed by patient: Thread/unthread left sleeve, Put head through opening, Thread/unthread right sleeve, Pull shirt over trunk Pull over shirt/dress - Perfomed by helper: Pull shirt over trunk Assist Level: Set up Set up : To obtain clothing/put away Function - Lower Body Dressing/Undressing What is the patient wearing?: Pants, Non-skid slipper socks Position: Wheelchair/chair at sink Underwear - Performed by patient: Thread/unthread left underwear leg, Pull underwear up/down, Thread/unthread right underwear leg Underwear - Performed by helper: Thread/unthread right underwear leg Pants- Performed by patient: Thread/unthread left pants leg, Pull pants up/down, Thread/unthread right pants leg Pants- Performed by helper: Thread/unthread right pants leg Non-skid slipper socks- Performed by patient: Don/doff right sock, Don/doff left sock Non-skid slipper socks- Performed by helper: Don/doff right sock, Don/doff left sock Assist for footwear: Setup Assist for lower body dressing: Supervision or verbal cues  Function - Toileting Toileting activity did not occur: No continent bowel/bladder event Toileting steps completed by patient: Adjust clothing prior to toileting, Performs perineal hygiene, Adjust clothing after toileting Toileting Assistive Devices: Grab bar or rail Assist level: Touching or steadying assistance (Pt.75%)  Function - Air cabin crew transfer assistive device: Grab bar Assist level to toilet: Touching or steadying assistance (Pt > 75%) Assist level from toilet: Touching or steadying assistance (Pt > 75%)  Function - Chair/bed transfer Chair/bed transfer method: Stand pivot Chair/bed transfer assist level: Touching or steadying assistance (Pt > 75%)  Function - Locomotion: Wheelchair Will patient use wheelchair at discharge?: No Wheelchair activity did not occur: N/A Wheel 50 feet with 2 turns activity did not  occur: N/A Wheel 150 feet activity did not occur: N/A Function - Locomotion: Chiropodist  device: Cane-straight Max distance: (150 ft) Assist level: Supervision or verbal cues Assist level: Supervision or verbal cues Assist level: Supervision or verbal cues Assist level: Supervision or verbal cues Assist level: Touching or steadying assistance (Pt > 75%)  Function - Comprehension Comprehension: Auditory Comprehension assist level: Follows complex conversation/direction with extra time/assistive device  Function - Expression Expression: Verbal Expression assist level: Expresses complex ideas: With extra time/assistive device  Function - Social Interaction Social Interaction assist level: Interacts appropriately with others with medication or extra time (anti-anxiety, antidepressant).  Function - Problem Solving Problem solving assist level: Solves complex problems: With extra time  Function - Memory Memory assist level: Recognizes or recalls 90% of the time/requires cueing < 10% of the time Patient normally able to recall (first 3 days only): Staff names and faces, That he or she is in a hospital, Current season  Medical Problem List and Plan: 1.right Side weakness secondary to left MCA infarction secondary to large vessel disease source of left ICA occlusion aspirin for CVA prophylaxis Cont  CIR PT OT, SLP  2. DVT Prophylaxis/Anticoagulation: SCDs. Venous Dopplers negative 3. Pain Management:Tylenol as needed, RIght toe pain,better  4. Mood:Provide emotional support 5. Neuropsych: This patientiscapable of making decisions on hisown behalf. 6. Skin/Wound Care:Routine skin checks, outpatient follow-up with dermatology for hemangioma 7. Fluids/Electrolytes/Nutrition:Routine I&O's with follow-up chemistries, intake good  D/C IV8.Dysphagia. Dysphagia #3thin liquids. Follow-up speech therapy 9.Frontal scalp solid large nodule. Follow-up outpatient  dermatology 10.CKDstage III. Follow-up chemistries GFR at baseline 11.Hyperlipidemia. Lipitor 12.Hypothyroidism. TSH 5.170. Patient on no thyroid medications prior to admission. LOS (Days) 4 A FACE TO FACE EVALUATION WAS PERFORMED  Anya Murphey E 11/27/2016, 8:06 AM

## 2016-11-27 NOTE — Progress Notes (Signed)
Physical Therapy Session Note  Patient Details  Name: JOSSUE RUBENSTEIN MRN: [id number] Date of Birth: 02/08/1940  Today's Date: 11/27/2016 PT Individual Time: 1100-1158 PT Individual Time Calculation (min): 58 min   Short Term Goals: Week 1:  PT Short Term Goal 1 (Week 1): STG=LTG due to ELOS   Skilled Therapeutic Interventions/Progress Updates: Pt presented in bed agreeable to therapy. Pt ambulated to day room no AD and performed NuStep L3 x 10 min for endurance and reciprocal movement. Performed Dynavision mode B 2 trials on level tile and x2 trials on compliant surface with pt able to hit target with 90% accuracy. Performed high balance activities including walking with head turns, weaving through cones, walks with toe taps, and gait with varying speeds. Pt participated in ball toss and ambulating with ball with emphasis on duel task activities. Pt returned to room and remained seated at EOB to eat lunch with call bell within reach and needs met.      Therapy Documentation Precautions:  Precautions Precautions: Fall Restrictions Weight Bearing Restrictions: No General:   Vital Signs:  Pain: Pain Assessment Pain Score: 0-No pain   See Function Navigator for Current Functional Status.   Therapy/Group: Individual Therapy  Veda Arrellano  Jordie Schreur, PTA  11/27/2016, 12:05 PM

## 2016-11-27 NOTE — Progress Notes (Signed)
Occupational Therapy Session Note  Patient Details  Name: Keith [name] MRN: [id number] Date of Birth: 03/20/1940  Today's Date: 11/27/2016 OT Individual Time: 8466-5993 OT Individual Time Calculation (min): 75 min    Short Term Goals: Week 1:  OT Short Term Goal 1 (Week 1): STG=LTG 2/2 ELOS  Skilled Therapeutic Interventions/Progress Updates:    Pt seen for OT session focusing on functional transfers and neuro re-ed with functional use of R UE. Pt in supine upon arrival, ready for tx session. He declined bathing/dressing today as he showered yesterday.  He ambulated throughout room with close supervision, completed grooming tasks from standing position using R hand at non-dominant level mod I. He ambulated to ADL apartment in same manner as described above. Completd simulated tub/shower transfer practicing this time stepping over tub ledge. First trial requiring min A, second trial following demonstratin for technique he completed with close supervision. Educated regarding recommendations of mod I using tub transfer bench or supervision if stepping over ledge to shower chair. Pt agreement to having son supervise and use shower chair. Plans to private purchase shower chair.  Returned to therapy gym, completed fine motor activities threading beads on string, buttoning/unbuttoning mens dress shirt and threading/unthreading nuts/bolts. All completed using R hand mod I. Completed standing trial on foam mat while completing fine motor task, supervision for standing balance. Pt returned to room at end of session, returned to bed, and left with all needs in reach and bed alarm on.    Therapy Documentation Precautions:  Precautions Precautions: Fall Restrictions Weight Bearing Restrictions: No Pain:   No/ denies pain ADL: ADL ADL Comments: Please see functional navigator  See Function Navigator for Current Functional Status.   Therapy/Group: Individual Therapy  Lewis, Edra Riccardi  C 11/27/2016, 7:08 AM

## 2016-11-28 ENCOUNTER — Inpatient Hospital Stay (HOSPITAL_COMMUNITY): Payer: Self-pay | Admitting: Physical Therapy

## 2016-11-28 ENCOUNTER — Inpatient Hospital Stay (HOSPITAL_COMMUNITY): Payer: Self-pay

## 2016-11-28 ENCOUNTER — Inpatient Hospital Stay (HOSPITAL_COMMUNITY): Payer: Self-pay | Admitting: Occupational Therapy

## 2016-11-28 NOTE — Progress Notes (Signed)
Social Work Patient ID: Keith [name], male   DOB: May 28, 1940, 76 y.o.   MRN: [id number]  Team feels pt will be ready for discharge Thursday, checking with MD and will work on discharge needs. Pt feels he is doing well and hopeful to discharge some time this week.

## 2016-11-28 NOTE — Progress Notes (Signed)
Physical Therapy Session Note  Patient Details  Name: Keith [name] MRN: [id number] Date of Birth: 05-Oct-1940  Today's Date: 11/28/2016 PT Individual Time: 1330-1430 PT Individual Time Calculation (min): 60 min   Short Term Goals: Week 1:  PT Short Term Goal 1 (Week 1): STG=LTG due to ELOS   Skilled Therapeutic Interventions/Progress Updates: Pt presented in bed agreeable to therapy. Pt ambulated to ortho gym supervision. Pt participated in standing balance activities using Wii Fit, pt cues for increasing hip strategy and decreased use of shoulders/head. Pt demonstrating no LOB with all activities. Pt ambulated to day room and performed NuStep L4 x10 min for endurance. Pt returned to room and returned to bed in same manner as prior and all needs met.      Therapy Documentation Precautions:  Precautions Precautions: Fall Restrictions Weight Bearing Restrictions: No   See Function Navigator for Current Functional Status.   Therapy/Group: Individual Therapy  Prisha Hiley  Barbette Mcglaun, PTA  11/28/2016, 4:15 PM

## 2016-11-28 NOTE — Progress Notes (Signed)
Team Conference noted today. Pt admitted for R hemiparesis s/p CVA with moderate upper and lower body strength noted. All extremities with purposeful movement. Continent with incontinent episodes noted. New Loop recorder placed to Left chest with Dry Intact drsg that has slight shadowing ontop. Able to make needs known and assist x 1. Last BM 11/27/16. Able to swallow pills whole with thin liquids. Continues on dysphagia 3 diet. SCDS and Asa for vte with blood precaution noted. Educated on scds, their use and application. callbell within reach. Will continue to monitor.

## 2016-11-28 NOTE — Progress Notes (Signed)
Speech Language Pathology Daily Session Note  Patient Details  Name: Keith [name] MRN: [id number] Date of Birth: 1940-06-19  Today's Date: 11/28/2016 SLP Individual Time: 0804-0900 SLP Individual Time Calculation (min): 56 min  Short Term Goals: Week 1: SLP Short Term Goal 1 (Week 1): STG=LTG due to ELOS SLP Short Term Goal 2 (Week 1): Pt will utilize speech intellgibility strategies in conversation with supervision A vebral cues to achieve 95% intelligibility.  SLP Short Term Goal 3 (Week 1): Pt will complete semi-complex tasks with supervision A verbal and question cues for functional problem solving. SLP Short Term Goal 4 (Week 1): Pt will utilize word finding strategies during functional tasks with supervision A verbal and question cues.   Skilled Therapeutic Interventions: Skilled ST services focused on cognitive skills. SLP facilitated recall of safety precautions during ambulation and recall pervious therapy session events with supervision A question cues. SLP facilitated word finding skills in simple to complex convergent, divergent, and word associations tasks requiring min-supervision A verbal cues. Pt agreed that " my thinking skills feel back to normal." Pt was left in room with call bell within reach. Continue skilled ST services and plan for discharge.      Function:  Eating Eating                 Cognition Comprehension Comprehension assist level: Follows complex conversation/direction with extra time/assistive device  Expression   Expression assist level: Expresses complex ideas: With extra time/assistive device  Social Interaction Social Interaction assist level: Interacts appropriately with others - No medications needed.  Problem Solving Problem solving assist level: Solves complex problems: Recognizes & self-corrects  Memory Memory assist level: Recognizes or recalls 90% of the time/requires cueing < 10% of the time    Pain Pain Assessment Pain  Assessment: No/denies pain  Therapy/Group: Individual Therapy  Mckinsey Keagle  [facility] 11/28/2016, 9:02 AM

## 2016-11-28 NOTE — Progress Notes (Signed)
Occupational Therapy Session Note  Patient Details  Name: Keith [name] MRN: [id number] Date of Birth: 1940-12-19  Today's Date: 11/28/2016 OT Individual Time: 0945-1100 OT Individual Time Calculation (min): 75 min    Short Term Goals: Week 1:  OT Short Term Goal 1 (Week 1): STG=LTG 2/2 ELOS  Skilled Therapeutic Interventions/Progress Updates:    Pt seen for OT ADL bathing/dressing session and session focising on IADL re-trianing. Pt in supine upon arrival, ready for tx session. He ambulated throughout session with distant supervision. He gathered clothing items from drawers and completed standing bathing task in shower. Shower chair available for rest breaks.  He returned to w/c to dress and standing grooming tasks with distant supervision. In ADL apartment, completed simulated simple meal prep activity making pot of coffee and accessing overhead cabinets, removing items with R UE. Seated rest breaks provided throughout IADL re-training on low soft surface couch with supervision.  Ambulated off unit to hospital gift store working on dual task activity, one LOB episode requiring min A to regain balance. He returned to unit and completed floor transfer with supervision following demonstration for technique. Reviewed what to do in case of fall and recommendation to carry cell phone when alone in house. Pt returned to room at end of session, left seated in w/c with all needs in reach.    Therapy Documentation Precautions:  Precautions Precautions: Fall Restrictions Weight Bearing Restrictions: No Pain:   No/ denies pain ADL: ADL ADL Comments: Please see functional navigator  See Function Navigator for Current Functional Status.   Therapy/Group: Individual Therapy  Lewis, Emmie Frakes C 11/28/2016, 7:06 AM

## 2016-11-28 NOTE — Progress Notes (Signed)
Subjective/Complaints:  No issues overnite, working with SLP  Review of systems denies chest pain, shortness of breath, nausea, vomiting, diarrhea, constipation  Objective: Vital Signs: Blood pressure (!) 125/58, pulse 66, temperature 97.6 F (36.4 C), temperature source Oral, resp. rate 18, height 5\' 9"  (1.753 m), weight 63.4 kg (139 lb 13.3 oz), SpO2 99 %. No results found. No results found for this or any previous visit (from the past 72 hour(s)).   HEENT: Scalp hemangioma at vertex, non tender Cardio: RRR and no murmur Resp: CTA B/L and unlabored GI: BS positive and non tender , non distended Extremity:  No Edema, weak pedal pulse but normal temp Skin:   Intact and Other loop recoder site with dressing intact , mild tenderness Neuro: Alert/Oriented, Abnormal Motor 4+ Right deltoid, bi,tri, grip, HF, KE ADF, 5/5 on Left side and Abnormal FMC Ataxic/ dec FMC Musc/Skel:  Other gait shows no evidence of toe drag, decreased arm swing and decreased trunkal rotation, decreased Right hallux ROM Gen NAD   Assessment/Plan: 1. Functional deficits secondary to Left MCA infarct which require 3+ hours per day of interdisciplinary therapy in a comprehensive inpatient rehab setting. Physiatrist is providing close team supervision and 24 hour management of active medical problems listed below. Physiatrist and rehab team continue to assess barriers to discharge/monitor patient progress toward functional and medical goals. FIM: Function - Bathing Position: Shower Body parts bathed by patient: Right arm, Left arm, Chest, Abdomen, Front perineal area, Buttocks, Right upper leg, Right lower leg, Left upper leg, Left lower leg Assist Level: Supervision or verbal cues  Function- Upper Body Dressing/Undressing What is the patient wearing?: Pull over shirt/dress Pull over shirt/dress - Perfomed by patient: Thread/unthread left sleeve, Put head through opening, Thread/unthread right sleeve, Pull shirt  over trunk Pull over shirt/dress - Perfomed by helper: Pull shirt over trunk Assist Level: Set up Set up : To obtain clothing/put away Function - Lower Body Dressing/Undressing What is the patient wearing?: Pants, Non-skid slipper socks Position: Wheelchair/chair at sink Underwear - Performed by patient: Thread/unthread left underwear leg, Pull underwear up/down, Thread/unthread right underwear leg Underwear - Performed by helper: Thread/unthread right underwear leg Pants- Performed by patient: Thread/unthread left pants leg, Pull pants up/down, Thread/unthread right pants leg Pants- Performed by helper: Thread/unthread right pants leg Non-skid slipper socks- Performed by patient: Don/doff right sock, Don/doff left sock Non-skid slipper socks- Performed by helper: Don/doff right sock, Don/doff left sock Assist for footwear: Setup Assist for lower body dressing: Supervision or verbal cues  Function - Toileting Toileting activity did not occur: No continent bowel/bladder event Toileting steps completed by patient: Adjust clothing prior to toileting, Performs perineal hygiene, Adjust clothing after toileting Toileting Assistive Devices: Grab bar or rail Assist level: Touching or steadying assistance (Pt.75%)  Function - Air cabin crew transfer assistive device: Grab bar Assist level to toilet: Touching or steadying assistance (Pt > 75%) Assist level from toilet: Touching or steadying assistance (Pt > 75%)  Function - Chair/bed transfer Chair/bed transfer method: Stand pivot Chair/bed transfer assist level: Touching or steadying assistance (Pt > 75%)  Function - Locomotion: Wheelchair Will patient use wheelchair at discharge?: No Wheelchair activity did not occur: N/A Wheel 50 feet with 2 turns activity did not occur: N/A Wheel 150 feet activity did not occur: N/A Function - Locomotion: Ambulation Assistive device: Cane-straight Max distance: (150 ft) Assist level:  Supervision or verbal cues Assist level: Supervision or verbal cues Assist level: Supervision or verbal cues Assist level: Supervision or  verbal cues Assist level: Touching or steadying assistance (Pt > 75%)  Function - Comprehension Comprehension: Auditory Comprehension assist level: Follows complex conversation/direction with extra time/assistive device  Function - Expression Expression: Verbal Expression assist level: Expresses complex ideas: With extra time/assistive device  Function - Social Interaction Social Interaction assist level: Interacts appropriately with others - No medications needed.  Function - Problem Solving Problem solving assist level: Solves complex problems: Recognizes & self-corrects  Function - Memory Memory assist level: Recognizes or recalls 90% of the time/requires cueing < 10% of the time Patient normally able to recall (first 3 days only): Staff names and faces, That he or she is in a hospital, Current season  Medical Problem List and Plan: 1.right Side weakness secondary to left MCA infarction secondary to large vessel disease source of left ICA occlusion aspirin for CVA prophylaxis Cont  CIR PT OT, SLP , team conf in am 2. DVT Prophylaxis/Anticoagulation: SCDs. Venous Dopplers negative 3. Pain Management:Tylenol as needed, RIght toe pain,better  4. Mood:Provide emotional support 5. Neuropsych: This patientiscapable of making decisions on hisown behalf. 6. Skin/Wound Care:Routine skin checks, outpatient follow-up with dermatology for hemangioma 7. Fluids/Electrolytes/Nutrition:Routine I&O's with follow-up chemistries, intake good  D/C IV8.Dysphagia. Dysphagia #3thin liquids. Follow-up speech therapy 9.Frontal scalp solid large nodule. Follow-up outpatient dermatology 10.CKDstage III. Follow-up chemistries GFR at baseline 11.Hyperlipidemia. Lipitor 12.Hypothyroidism. TSH 5.170. Patient on no thyroid medications prior to  admission. LOS (Days) 5 A FACE TO FACE EVALUATION WAS PERFORMED  KIRSTEINS,ANDREW E 11/28/2016, 8:16 AM

## 2016-11-29 ENCOUNTER — Inpatient Hospital Stay (HOSPITAL_COMMUNITY): Payer: Medicare Other | Admitting: Speech Pathology

## 2016-11-29 ENCOUNTER — Inpatient Hospital Stay (HOSPITAL_COMMUNITY): Payer: Self-pay | Admitting: Occupational Therapy

## 2016-11-29 ENCOUNTER — Inpatient Hospital Stay (HOSPITAL_COMMUNITY): Payer: Self-pay | Admitting: Physical Therapy

## 2016-11-29 NOTE — Progress Notes (Signed)
Physical Therapy Discharge Summary  Patient Details  Name: Keith [name] MRN: [id number] Date of Birth: 1940-03-31  Today's Date: 11/29/2016   Patient has met 10 of 10 long term goals due to improved activity tolerance, improved balance, improved postural control, increased strength, improved awareness and improved coordination.  Patient to discharge at an ambulatory level Modified Independent.   Patient's care partner not needed due to Mod I level of care at discharge.  Reasons goals not met:all PT goals met   Recommendation:  Patient will not benefit from ongoing skilled PT services due to current functional level.   Equipment: No equipment provided  Reasons for discharge: treatment goals met  Patient/family agrees with progress made and goals achieved: Yes  PT Discharge    Cognition Overall Cognitive Status: Within Functional Limits for tasks assessed Arousal/Alertness: Awake/alert Orientation Level: Oriented X4 Memory: Appears intact Awareness: Appears intact Problem Solving: Appears intact Problem Solving Impairment: Functional complex Safety/Judgment: Appears intact Motor  Motor Motor: Within Functional Limits  Mobility Bed Mobility Bed Mobility: Rolling Right;Rolling Left;Supine to Sit;Sit to Supine Rolling Right: 6: Modified independent (Device/Increase time) Rolling Left: 6: Modified independent (Device/Increase time) Supine to Sit: 6: Modified independent (Device/Increase time) Sit to Supine: 6: Modified independent (Device/Increase time) Transfers Sit to Stand: 6: Modified independent (Device/Increase time) Stand Pivot Transfers: 6: Modified independent (Device/Increase time) Locomotion  Ambulation Ambulation/Gait Assistance: 6: Modified independent (Device/Increase time) Assistive device: None Gait Gait: Yes Gait Pattern: Narrow base of support Stairs / Additional Locomotion Stairs: Yes Stairs Assistance: 6: Modified independent (Device/Increase  time) Stair Management Technique: One rail Right Number of Stairs: 12  Trunk/Postural Assessment  Cervical Assessment Cervical Assessment: Exceptions to WFL(forward head) Thoracic Assessment Thoracic Assessment: Within Functional Limits Lumbar Assessment Lumbar Assessment: Within Functional Limits Postural Control Postural Control: Deficits on evaluation  Balance Balance Balance Assessed: Yes Extremity Assessment      RLE Assessment RLE Assessment: Within Functional Limits LLE Assessment LLE Assessment: Within Functional Limits   See Function Navigator for Current Functional Status.  Rosita DeChalus 11/29/2016, 12:08 PM

## 2016-11-29 NOTE — Progress Notes (Signed)
Social Work Patient ID: Keith [name], male   DOB: May 10, 1940, 76 y.o.   MRN: [id number]  Met with pt to inform team conference goals of mod/i level and discharge tomorrow. MD is in agreement to this and feels medically stable. No equipment or follow up needs. Pt is pleased with his progress and will sign up for Medicare part B for his MD coverage. Ready for discharge tomorrow.

## 2016-11-29 NOTE — Discharge Instructions (Signed)
Inpatient Rehab Discharge Instructions  [place] Discharge date and time: No discharge date for patient encounter.   Activities/Precautions/ Functional Status: Activity: activity as tolerated Diet: mechanical soft Wound Care: none needed Functional status:  ___ No restrictions     ___ Walk up steps independently ___ 24/7 supervision/assistance   ___ Walk up steps with assistance ___ Intermittent supervision/assistance  ___ Bathe/dress independently ___ Walk with walker     _x__ Bathe/dress with assistance ___ Walk Independently    ___ Shower independently ___ Walk with assistance    ___ Shower with assistance ___ No alcohol     ___ Return to work/school ________  Special Instructions: No driving  Follow-up outpatient dermatology in regards to frontal scalp solid large nodule   COMMUNITY REFERRALS UPON DISCHARGE:   None:HOME EXERCISE PROGRAM GIVEN TO PATIENT NO HOME HEALTH OR OUTPATIENT REHAB RECOMMENDED  Medical Equipment/Items Ordered: NONE RECOMMENDED  GENERAL COMMUNITY RESOURCES FOR PATIENT/FAMILY: Support Groups:[facility] SUPPORT GROUP EVERY SECOND Thursday @ 3:00-4:00 PM ON THE REHAB UNIT QUESTIONS CONTACT [place] [phone]  STROKE/TIA DISCHARGE INSTRUCTIONS SMOKING Cigarette smoking nearly doubles your risk of having a stroke & is the single most alterable risk factor  If you smoke or have smoked in the last 12 months, you are advised to quit smoking for your health.  Most of the excess cardiovascular risk related to smoking disappears within a year of stopping.  Ask you doctor about anti-smoking medications  [place] Quit Line: 1-800-QUIT NOW  Free Smoking Cessation Classes [phone]  CHOLESTEROL Know your levels; limit fat & cholesterol in your diet  Lipid Panel     Component Value Date/Time   CHOL 183 11/20/2016 0245   TRIG 115 11/20/2016 0245   HDL 35 (L) 11/20/2016 0245   CHOLHDL 5.2 11/20/2016 0245   VLDL 23 11/20/2016 0245   LDLCALC 125 (H)  11/20/2016 0245      Many patients benefit from treatment even if their cholesterol is at goal.  Goal: Total Cholesterol (CHOL) less than 160  Goal:  Triglycerides (TRIG) less than 150  Goal:  HDL greater than 40  Goal:  LDL (LDLCALC) less than 100   BLOOD PRESSURE American Stroke Association blood pressure target is less that 120/80 mm/Hg  Your discharge blood pressure is:  BP: (!) 125/58  Monitor your blood pressure  Limit your salt and alcohol intake  Many individuals will require more than one medication for high blood pressure  DIABETES (A1c is a blood sugar average for last 3 months) Goal HGBA1c is under 7% (HBGA1c is blood sugar average for last 3 months)  Diabetes: No known diagnosis of diabetes    Lab Results  Component Value Date   HGBA1C 5.5 11/20/2016     Your HGBA1c can be lowered with medications, healthy diet, and exercise.  Check your blood sugar as directed by your physician  Call your physician if you experience unexplained or low blood sugars.  PHYSICAL ACTIVITY/REHABILITATION Goal is 30 minutes at least 4 days per week  Activity: Increase activity slowly, Therapies: Physical Therapy: Home Health Return to work:   Activity decreases your risk of heart attack and stroke and makes your heart stronger.  It helps control your weight and blood pressure; helps you relax and can improve your mood.  Participate in a regular exercise program.  Talk with your doctor about the best form of exercise for you (dancing, walking, swimming, cycling).  DIET/WEIGHT Goal is to maintain a healthy weight  Your discharge diet is: DIET  DYS 3 Room service appropriate? Yes; Fluid consistency: Thin  liquids Your height is:  Height: 5\' 9"  (175.3 cm) Your current weight is: Weight: 63.4 kg (139 lb 13.3 oz) Your Body Mass Index (BMI) is:  BMI (Calculated): 20.64  Following the type of diet specifically designed for you will help prevent another stroke.  Your goal weight range  is:    Your goal Body Mass Index (BMI) is 19-24.  Healthy food habits can help reduce 3 risk factors for stroke:  High cholesterol, hypertension, and excess weight.  RESOURCES Stroke/Support Group:  Call [phone]   STROKE EDUCATION PROVIDED/REVIEWED AND GIVEN TO PATIENT Stroke warning signs and symptoms How to activate emergency medical system (call 911). Medications prescribed at discharge. Need for follow-up after discharge. Personal risk factors for stroke. Pneumonia vaccine given:  Flu vaccine given:  My questions have been answered, the writing is legible, and I understand these instructions.  I will adhere to these goals & educational materials that have been provided to me after my discharge from the hospital.      My questions have been answered and I understand these instructions. I will adhere to these goals and the provided educational materials after my discharge from the hospital.  Patient/Caregiver Signature _______________________________ Date __________  Clinician Signature _______________________________________ Date __________  Please bring this form and your medication list with you to all your follow-up doctor's appointments.

## 2016-11-29 NOTE — Discharge Summary (Signed)
Discharge summary job [id number]

## 2016-11-29 NOTE — Progress Notes (Signed)
Social Work   Kavir Savoca, Eliezer Champagne  Social Worker  Physical Medicine and Rehabilitation  Patient Care Conference  Signed  Date of Service:  11/29/2016 12:47 PM          Signed          _0 Hide copied text  _1 Hover for details   Inpatient RehabilitationTeam Conference and Plan of Care Update Date: 11/29/2016   Time: 10:30 AM      Patient Name: Keith [name]      Medical Record Number: [id number]  Date of Birth: Mar 11, 1940 Sex: Male         Room/Bed: 4W18C/4W18C-01 Payor Info: Payor: MEDICARE / Plan: MEDICARE PART A / Product Type: *No Product type* /     Admitting Diagnosis: Stroke LT CVA  Admit Date/Time:  11/23/2016  4:09 PM Admission Comments: No comment available    Primary Diagnosis:  Hemiparesis affecting right side as late effect of cerebrovascular accident (CVA) ([place]) Principal Problem: Hemiparesis affecting right side as late effect of cerebrovascular accident (CVA) [facility])       Patient Active Problem List    Diagnosis Date Noted  . Hemiparesis affecting right side as late effect of cerebrovascular accident (CVA) ([place]) 11/23/2016  . Gait disturbance, post-stroke 11/23/2016  . Left middle cerebral artery stroke ([place]) 11/23/2016  . SAH (subarachnoid hemorrhage) ([place]) 11/20/2016  . Hypothyroidism 11/20/2016  . Neoplasm of skin of scalp 11/20/2016  . CVA (cerebral vascular accident) ([place]) 11/20/2016  . Gait disturbance    . Subarachnoid hemorrhage ([place])    . Dysphagia, post-stroke    . Stage 3 chronic kidney disease ([place])    . Hyperglycemia    . Acute blood loss anemia        Expected Discharge Date: Expected Discharge Date: 11/30/16   Team Members Present: Physician leading conference: Dr. Alysia Penna Social Worker Present: Ovidio Kin, LCSW Nurse Present: Other (comment)(Angelina Vanessa Elgin, RN) PT Present: Barrie Folk, PT;Rosita Dechalus, PTA OT Present: Napoleon Form, OT SLP Present: Stormy Fabian, SLP       Current Status/Progress Goal  Weekly Team Focus  Medical     Blood pressure controlled, asymptomatic bradycardia, oral intake improved  Reduce fall risk  Discharge planning   Bowel/Bladder     Continent of B/B. LBM 11/12  Supervision  Remain continent   Swallow/Nutrition/ Hydration               ADL's     Supervision-mod I  Supervision-mod I  therapy goals met, pt to d/c home tomorrow with intermittent supervision assist   Mobility     supervision overall  Mod I  d/c planning, higher balance activies   Communication               Safety/Cognition/ Behavioral Observations   Mod I  Mod I  basic problem solving, memory   Pain     No complaints of pain  <3  assess pain q 4hr and prn   Skin     Scalp hemangioma at vertex, non-tender  skin to remain CDI  assess skin q shift and prn     *See Care Plan and progress notes for long and short-term goals.      Barriers to Discharge   Current Status/Progress Possible Resolutions Date Resolved   Physician           Achieving goals  Discharge in a.m.      Nursing  PT                    OT                 SLP            SW Medication compliance Doesn't go to the MD             Discharge Planning/Teaching Needs:  Home with family coming in and out, will almost provide 24 hr supervision.      Team Discussion:  Progressing toward his goals of mod/i level. Made mod/i in room today in preparation for DC tomorrow. No follow up recommended and/or equipment. Family will be in and out at home. Ready for DC tomorrow.   Revisions to Treatment Plan:  DC 11/15    Continued Need for Acute Rehabilitation Level of Care: The patient requires daily medical management by a physician with specialized training in physical medicine and rehabilitation for the following conditions: Daily direction of a multidisciplinary physical rehabilitation program to ensure safe treatment while eliciting the highest outcome that is of practical value to the patient.: Yes Daily  medical management of patient stability for increased activity during participation in an intensive rehabilitation regime.: Yes Daily analysis of laboratory values and/or radiology reports with any subsequent need for medication adjustment of medical intervention for : Neurological problems;Other   Elease Hashimoto 11/29/2016, 12:47 PM                  Latesha Chesney, Gardiner Rhyme, LCSW  Social Worker  Physical Medicine and Rehabilitation  Patient Care Conference  Signed  Date of Service:  11/29/2016 12:47 PM          Signed          _0 Hide copied text  _1 Hover for details   Inpatient RehabilitationTeam Conference and Plan of Care Update Date: 11/29/2016   Time: 10:30 AM      Patient Name: Keith [name]      Medical Record Number: [id number]  Date of Birth: 04-24-1940 Sex: Male         Room/Bed: 4W18C/4W18C-01 Payor Info: Payor: MEDICARE / Plan: MEDICARE PART A / Product Type: *No Product type* /     Admitting Diagnosis: Stroke LT CVA  Admit Date/Time:  11/23/2016  4:09 PM Admission Comments: No comment available    Primary Diagnosis:  Hemiparesis affecting right side as late effect of cerebrovascular accident (CVA) ([place]) Principal Problem: Hemiparesis affecting right side as late effect of cerebrovascular accident (CVA) [facility])       Patient Active Problem List    Diagnosis Date Noted  . Hemiparesis affecting right side as late effect of cerebrovascular accident (CVA) ([place]) 11/23/2016  . Gait disturbance, post-stroke 11/23/2016  . Left middle cerebral artery stroke ([place]) 11/23/2016  . SAH (subarachnoid hemorrhage) ([place]) 11/20/2016  . Hypothyroidism 11/20/2016  . Neoplasm of skin of scalp 11/20/2016  . CVA (cerebral vascular accident) ([place]) 11/20/2016  . Gait disturbance    . Subarachnoid hemorrhage ([place])    . Dysphagia, post-stroke    . Stage 3 chronic kidney disease ([place])    . Hyperglycemia    . Acute blood loss anemia        Expected Discharge Date:  Expected Discharge Date: 11/30/16   Team Members Present: Physician leading conference: Dr. Alysia Penna Social Worker Present: Ovidio Kin, LCSW Nurse Present: Other (comment)(Angelina Vanessa [place], RN) PT Present: Barrie Folk, PT;Rosita Dechalus, PTA OT Present: Napoleon Form, OT SLP  Present: Stormy Fabian, SLP       Current Status/Progress Goal Weekly Team Focus  Medical     Blood pressure controlled, asymptomatic bradycardia, oral intake improved  Reduce fall risk  Discharge planning   Bowel/Bladder     Continent of B/B. LBM 11/12  Supervision  Remain continent   Swallow/Nutrition/ Hydration               ADL's     Supervision-mod I  Supervision-mod I  therapy goals met, pt to d/c home tomorrow with intermittent supervision assist   Mobility     supervision overall  Mod I  d/c planning, higher balance activies   Communication               Safety/Cognition/ Behavioral Observations   Mod I  Mod I  basic problem solving, memory   Pain     No complaints of pain  <3  assess pain q 4hr and prn   Skin     Scalp hemangioma at vertex, non-tender  skin to remain CDI  assess skin q shift and prn     *See Care Plan and progress notes for long and short-term goals.      Barriers to Discharge   Current Status/Progress Possible Resolutions Date Resolved   Physician           Achieving goals  Discharge in a.m.      Nursing                 PT                    OT                 SLP            SW Medication compliance Doesn't go to the MD             Discharge Planning/Teaching Needs:  Home with family coming in and out, will almost provide 24 hr supervision.      Team Discussion:  Progressing toward his goals of mod/i level. Made mod/i in room today in preparation for DC tomorrow. No follow up recommended and/or equipment. Family will be in and out at home. Ready for DC tomorrow.   Revisions to Treatment Plan:  DC 11/15    Continued Need for Acute Rehabilitation  Level of Care: The patient requires daily medical management by a physician with specialized training in physical medicine and rehabilitation for the following conditions: Daily direction of a multidisciplinary physical rehabilitation program to ensure safe treatment while eliciting the highest outcome that is of practical value to the patient.: Yes Daily medical management of patient stability for increased activity during participation in an intensive rehabilitation regime.: Yes Daily analysis of laboratory values and/or radiology reports with any subsequent need for medication adjustment of medical intervention for : Neurological problems;Other   Elease Hashimoto 11/29/2016, 12:47 PM                  Elida Harbin, Gardiner Rhyme, LCSW  Social Worker  Physical Medicine and Rehabilitation  Patient Care Conference  Signed  Date of Service:  11/29/2016 12:47 PM          Signed          _0 Hide copied text  _1 Hover for details   Inpatient RehabilitationTeam Conference and Plan of Care Update Date: 11/29/2016   Time: 10:30 AM      Patient Name: Keith Craun  [name]      Medical Record Number: [id number]  Date of Birth: [birth year] Sex: Male         Room/Bed: 4W18C/4W18C-01 Payor Info: Payor: MEDICARE / Plan: MEDICARE PART A / Product Type: *No Product type* /     Admitting Diagnosis: Stroke LT CVA  Admit Date/Time:  11/23/2016  4:09 PM Admission Comments: No comment available    Primary Diagnosis:  Hemiparesis affecting right side as late effect of cerebrovascular accident (CVA) ([place]) Principal Problem: Hemiparesis affecting right side as late effect of cerebrovascular accident (CVA) [facility])       Patient Active Problem List    Diagnosis Date Noted  . Hemiparesis affecting right side as late effect of cerebrovascular accident (CVA) ([place]) 11/23/2016  . Gait disturbance, post-stroke 11/23/2016  . Left middle cerebral artery stroke ([place]) 11/23/2016  . SAH (subarachnoid hemorrhage)  ([place]) 11/20/2016  . Hypothyroidism 11/20/2016  . Neoplasm of skin of scalp 11/20/2016  . CVA (cerebral vascular accident) ([place]) 11/20/2016  . Gait disturbance    . Subarachnoid hemorrhage ([place])    . Dysphagia, post-stroke    . Stage 3 chronic kidney disease ([place])    . Hyperglycemia    . Acute blood loss anemia        Expected Discharge Date: Expected Discharge Date: 11/30/16   Team Members Present: Physician leading conference: Dr. Alysia Penna Social Worker Present: Ovidio Kin, LCSW Nurse Present: Other (comment)(Angelina Vanessa [place], RN) PT Present: Barrie Folk, PT;Rosita Dechalus, PTA OT Present: Napoleon Form, OT SLP Present: Stormy Fabian, SLP       Current Status/Progress Goal Weekly Team Focus  Medical     Blood pressure controlled, asymptomatic bradycardia, oral intake improved  Reduce fall risk  Discharge planning   Bowel/Bladder     Continent of B/B. LBM 11/12  Supervision  Remain continent   Swallow/Nutrition/ Hydration               ADL's     Supervision-mod I  Supervision-mod I  therapy goals met, pt to d/c home tomorrow with intermittent supervision assist   Mobility     supervision overall  Mod I  d/c planning, higher balance activies   Communication               Safety/Cognition/ Behavioral Observations   Mod I  Mod I  basic problem solving, memory   Pain     No complaints of pain  <3  assess pain q 4hr and prn   Skin     Scalp hemangioma at vertex, non-tender  skin to remain CDI  assess skin q shift and prn     *See Care Plan and progress notes for long and short-term goals.      Barriers to Discharge   Current Status/Progress Possible Resolutions Date Resolved   Physician           Achieving goals  Discharge in a.m.      Nursing                 PT                    OT                 SLP            SW Medication compliance Doesn't go to the MD             Discharge Planning/Teaching Needs:  Home with family coming in  and out,  will almost provide 24 hr supervision.      Team Discussion:  Progressing toward his goals of mod/i level. Made mod/i in room today in preparation for DC tomorrow. No follow up recommended and/or equipment. Family will be in and out at home. Ready for DC tomorrow.   Revisions to Treatment Plan:  DC 11/15    Continued Need for Acute Rehabilitation Level of Care: The patient requires daily medical management by a physician with specialized training in physical medicine and rehabilitation for the following conditions: Daily direction of a multidisciplinary physical rehabilitation program to ensure safe treatment while eliciting the highest outcome that is of practical value to the patient.: Yes Daily medical management of patient stability for increased activity during participation in an intensive rehabilitation regime.: Yes Daily analysis of laboratory values and/or radiology reports with any subsequent need for medication adjustment of medical intervention for : Neurological problems;Other   Elease Hashimoto 11/29/2016, 12:47 PM                 Patient ID: Lorayne Marek, male   DOB: 01/16/1941, 76 y.o.   MRN: [id number]

## 2016-11-29 NOTE — Progress Notes (Signed)
Physical Therapy Session Note  Patient Details  Name: Keith [name] MRN: [id number] Date of Birth: March 08, 1940  Today's Date: 11/29/2016 PT Individual Time: 1100-1200 PT Individual Time Calculation (min): 60 min   Short Term Goals: Week 1:  PT Short Term Goal 1 (Week 1): STG=LTG due to ELOS   Skilled Therapeutic Interventions/Progress Updates: Pt presented in bed agreeable to therapy. Pt participated in functional tasks to prepare for d/c. Pt was able to perform all activities including stairs, gait on compliant surface, transfers, bed mobility at mod I level. Pt participated in standing balance activities with Dynavision for coordination and reaction time. Pt able to maintain standing balance an Airex over 15 min. Pt returned to room and left with needs met.      Therapy Documentation Precautions:  Precautions Precautions: Fall Restrictions Weight Bearing Restrictions: No General:   Vital Signs:  Pain:   Mobility: Bed Mobility Bed Mobility: Rolling Right;Rolling Left;Supine to Sit;Sit to Supine Rolling Right: 6: Modified independent (Device/Increase time) Rolling Left: 6: Modified independent (Device/Increase time) Supine to Sit: 6: Modified independent (Device/Increase time) Sit to Supine: 6: Modified independent (Device/Increase time) Transfers Sit to Stand: 6: Modified independent (Device/Increase time) Stand Pivot Transfers: 6: Modified independent (Device/Increase time) Locomotion : Ambulation Ambulation/Gait Assistance: 6: Modified independent (Device/Increase time) Assistive device: None Gait Gait: Yes Gait Pattern: Narrow base of support Stairs / Additional Locomotion Stairs: Yes Stairs Assistance: 6: Modified independent (Device/Increase time) Stair Management Technique: One rail Right Number of Stairs: 12  Trunk/Postural Assessment : Cervical Assessment Cervical Assessment: Exceptions to WFL(forward head) Thoracic Assessment Thoracic Assessment: Within  Functional Limits Lumbar Assessment Lumbar Assessment: Within Functional Limits Postural Control Postural Control: Deficits on evaluation  Balance: Balance Balance Assessed: Yes Exercises:   Other Treatments:     See Function Navigator for Current Functional Status.   Therapy/Group: Individual Therapy  [name]  [name], PTA  11/29/2016, 12:09 PM

## 2016-11-29 NOTE — Discharge Summary (Signed)
Keith [name], Keith [name] NO.:  [id number]  MEDICAL RECORD NO.:  [id number]  LOCATION:                                 FACILITY:  PHYSICIAN:  Keith [name], M.D.DATE OF BIRTH:  09-20-1940  DATE OF ADMISSION:  11/23/2016 DATE OF DISCHARGE:  11/30/2016                              DISCHARGE SUMMARY   DISCHARGE DIAGNOSES: 1. Left middle cerebral artery infarction due to large vessel disease. 2. SCDs for deep venous thrombosis prophylaxis, pain management. 3. Dysphagia. 4. Frontal scalp solid large nodule. 5. Chronic kidney disease, stage 3. 6. Hyperlipidemia. 7. Hypothyroidism.  HISTORY OF PRESENT ILLNESS:  This is a 76 year old right-handed male with documented history of hypothyroidism; CKD, stage 3; as well as a 2.5 cm mass at the top of his skull that he had had for a number of years.  No prescription medications at time of admission.  He lives alone.  Works as a Presenter, broadcasting.  He has a son, who can arrange assistance as needed.  He presented on November 19, 2016, with headache, some incoordination of the right side.  Denied any recent trauma or fall.  Noted creatinine 2.38, TSH 5.170, troponin negative.  CT of the head reviewed, showed left SAH per report, trace left frontal acute to subacute subarachnoid hemorrhage.  MRI showed multifocal acute small frontal left MCA territory nonhemorrhagic infarction, 2.3 x 2.7 x 1.4 cm solid frontal scalp mass.  Carotid Doppler; 60% to 79% mid internal carotid artery stenosis, left internal carotid artery appeared occluded. MRA showed occluded left internal carotid artery with thready reconstitution, left supraclinoid segment.  Lower extremity Dopplers negative.  Echocardiogram with ejection fraction of 62%, grade 2 diastolic dysfunction.  Neurology consulted to follow up repeat cranial CT scan that showed no subarachnoid hemorrhage on followup as well as evolving nonhemorrhagic small left frontal MCA territory  infarct and a 2.6 cm soft tissue mass, left frontal scalp.  Recommendations were follow up Outpatient Dermatology for evaluation of frontal scalp and solid large nodule.  Cerebral arteriogram later completed showing approximately 45% stenosis, right ICA.  Acute left ICA proximal with partial distal reconstitution, maintained on aspirin for CVA prophylaxis.  Mechanical soft diet.  The patient was admitted for a comprehensive rehab program.  PAST MEDICAL HISTORY:  See discharge diagnoses.  SOCIAL HISTORY:  Lives alone, independent prior to admission.  FUNCTIONAL STATUS UPON ADMISSION TO REHAB SERVICES:  Minimal guard, 250 feet with a hemi-walker.  Minimal guard, sit to stand.  Mod to max assist, activities of daily living.  PHYSICAL EXAMINATION:  VITAL SIGNS:  Blood pressure 138/54, pulse 66, temperature 98, respirations 14. GENERAL:  This was an alert male, in no acute distress.  Speech without evidence of aphasia or apraxia. NECK:  Supple, nontender.  No JVD. CARDIAC:  Rate controlled. ABDOMEN:  Soft, nontender.  Good bowel sounds. LUNGS:  Clear to auscultation. SKIN:  He had a cystic mass to the top of his skull that was nontender. The patient had stated it had been there for a number of years.  REHABILITATION HOSPITAL COURSE:  The patient was admitted to Inpatient Rehab Services with therapies initiated on a 3-hour daily basis, consisting  of physical therapy, occupational therapy, speech therapy, and rehabilitation nursing.  The following issues were addressed during the patient's rehab stay.  Pertaining to [name] left MCA infarction, it remained stable, maintained on aspirin therapy.  He would follow up Neurology Services.  SCDs for DVT prophylaxis.  Venous Doppler studies negative.  Blood pressures controlled on no current antihypertensive medications.  CKD, stage 3.  Arrangements were to be made to follow up with PCP.  He was tolerating mechanical soft diet. Noted  frontal scalp solid large nodule.  The patient had stated it had been there for a number of years.  Recommendations were made by Neurology Services for Outpatient Dermatology.  This was discussed at length with son and the patient on need to establish outpatient followup.  He would continue Lipitor for hyperlipidemia. Hypothyroidism, TSH 5.71.  No current plans for thyroid medications at this time.  The patient received weekly collaborative interdisciplinary team conferences to discuss estimated length of stay, family teaching, any barriers to discharge.  The patient ambulating to and from the rehab GM with supervision.  Ambulates to the day room, supervision.  He could gather belongings for activities of daily living and homemaking. Ambulates throughout the session in his room to establish ADL.  Shower chair available for rest breaks if needed.  Follow up Speech Therapy. The patient utilized speech, intelligibility strategies, and conversation with supervision.  Completed semi-complex tasks with supervision.  It was advised no driving.  Family teaching completed and planned discharge to home.  DISCHARGE MEDICATIONS:  Include: 1. Aspirin 81 mg p.o. daily. 2. Lipitor 40 mg p.o. daily. 3. Ultracet 1 tablet every 4 hours as needed for pain.  DIET:  Mechanical soft.  FOLLOWUP:  The patient will follow up Dr. Alysia [name] at the [facility] as directed; Dr. Erlinda [name], Neurology Service, call for appointment; Dr. Estanislado [name], call for appointment.  SPECIAL INSTRUCTIONS:  No driving.  The patient to follow up Outpatient Dermatology in regard to frontal scalp solid large nodule, discussed with the patient and family.     Keith [name], P.A.   ______________________________ Keith [name], M.D.    DA/MEDQ  D:  11/29/2016  T:  11/29/2016  Job:  [id number]  cc:   Dr. Melvenia Needles K. Keith [name], M.D. Keith [name], M.D.

## 2016-11-29 NOTE — Progress Notes (Signed)
Team Conference noted today. Pt admitted with CVA, with Edge deficits noted for bed mobility. Skin with surgical site to left chest for loop recorder and dry drsg, otherwise skin intact. Continent of B/B. Uses urinal at bedside. Denies any pain at this time. Safety maintained. callbell within reach. Will continue to monitor.

## 2016-11-29 NOTE — Patient Care Conference (Signed)
Inpatient RehabilitationTeam Conference and Plan of Care Update Date: 11/29/2016   Time: 10:30 AM    Patient Name: Keith [name]      Medical Record Number: [id number]  Date of Birth: 1940-12-09 Sex: Male         Room/Bed: 4W18C/4W18C-01 Payor Info: Payor: MEDICARE / Plan: MEDICARE PART A / Product Type: *No Product type* /    Admitting Diagnosis: Stroke LT CVA  Admit Date/Time:  11/23/2016  4:09 PM Admission Comments: No comment available   Primary Diagnosis:  Hemiparesis affecting right side as late effect of cerebrovascular accident (CVA) ([place]) Principal Problem: Hemiparesis affecting right side as late effect of cerebrovascular accident (CVA) [facility])  Patient Active Problem List   Diagnosis Date Noted  . Hemiparesis affecting right side as late effect of cerebrovascular accident (CVA) ([place]) 11/23/2016  . Gait disturbance, post-stroke 11/23/2016  . Left middle cerebral artery stroke ([place]) 11/23/2016  . SAH (subarachnoid hemorrhage) ([place]) 11/20/2016  . Hypothyroidism 11/20/2016  . Neoplasm of skin of scalp 11/20/2016  . CVA (cerebral vascular accident) ([place]) 11/20/2016  . Gait disturbance   . Subarachnoid hemorrhage ([place])   . Dysphagia, post-stroke   . Stage 3 chronic kidney disease ([place])   . Hyperglycemia   . Acute blood loss anemia     Expected Discharge Date: Expected Discharge Date: 11/30/16  Team Members Present: Physician leading conference: Dr. Alysia Penna Social Worker Present: Ovidio Kin, LCSW Nurse Present: Other (comment)(Angelina Vanessa [place], RN) PT Present: Barrie Folk, PT;Rosita Dechalus, PTA OT Present: Napoleon Form, OT SLP Present: Stormy Fabian, SLP     Current Status/Progress Goal Weekly Team Focus  Medical   Blood pressure controlled, asymptomatic bradycardia, oral intake improved  Reduce fall risk  Discharge planning   Bowel/Bladder   Continent of B/B. LBM 11/12  Supervision  Remain continent   Swallow/Nutrition/ Hydration              ADL's   Supervision-mod I  Supervision-mod I  therapy goals met, pt to d/c home tomorrow with intermittent supervision assist   Mobility   supervision overall  Mod I  d/c planning, higher balance activies   Communication             Safety/Cognition/ Behavioral Observations  Mod I  Mod I  basic problem solving, memory   Pain   No complaints of pain  <3  assess pain q 4hr and prn   Skin   Scalp hemangioma at vertex, non-tender  skin to remain CDI  assess skin q shift and prn      *See Care Plan and progress notes for long and short-term goals.     Barriers to Discharge  Current Status/Progress Possible Resolutions Date Resolved   Physician          Achieving goals  Discharge in a.m.      Nursing                  PT                    OT                  SLP                SW Medication compliance Doesn't go to the MD            Discharge Planning/Teaching Needs:  Home with family coming in and out, will almost provide 24 hr supervision.  Team Discussion:  Progressing toward his goals of mod/i level. Made mod/i in room today in preparation for DC tomorrow. No follow up recommended and/or equipment. Family will be in and out at home. Ready for DC tomorrow.   Revisions to Treatment Plan:  DC 11/15    Continued Need for Acute Rehabilitation Level of Care: The patient requires daily medical management by a physician with specialized training in physical medicine and rehabilitation for the following conditions: Daily direction of a multidisciplinary physical rehabilitation program to ensure safe treatment while eliciting the highest outcome that is of practical value to the patient.: Yes Daily medical management of patient stability for increased activity during participation in an intensive rehabilitation regime.: Yes Daily analysis of laboratory values and/or radiology reports with any subsequent need for medication adjustment of medical intervention for :  Neurological problems;Other  Elease Hashimoto 11/29/2016, 12:47 PM

## 2016-11-29 NOTE — Progress Notes (Signed)
Occupational Therapy Session Note  Patient Details  Name: Keith [name] MRN: [id number] Date of Birth: 1940-07-22  Today's Date: 11/29/2016 OT Individual Time: 0930-1030 OT Individual Time Calculation (min): 60 min    Short Term Goals: Week 1:  OT Short Term Goal 1 (Week 1): STG=LTG 2/2 ELOS  Skilled Therapeutic Interventions/Progress Updates:    Pt seen for OT ADL bathing/dressing session. Pt in supine upon arrival, ready for tx session.  He ambulated throughout room mod I to gather clothing items and items in prep for showering task. He ambulated to ADL apartment and completed bathing task at shower level using tub/shower combination. Pt able to recall technique for stepping over tub ledge and completed transfer with supervision to shower chair and bathed sit <> stand mod I. Dressing completed mod I.  He required min questioning cues for problem solving in order to navigate back to room and to gym.  In therapy gym, completed pipe tree activity standing on non-compliant foam mat. Min questioning cues for self recognizing problems and distant supervision for balance. Pt returned to room at end of session, left sitting EOB with all needs in reach. Pt made mod I in room in prep for d/c home tomorrow at mod I level.   Therapy Documentation Precautions:  Precautions Precautions: Fall Restrictions Weight Bearing Restrictions: No Pain:   No/denies pain ADL: ADL ADL Comments: Please see functional navigator  See Function Navigator for Current Functional Status.   Therapy/Group: Individual Therapy  Lewis, Treyvonne Tata C 11/29/2016, 7:14 AM

## 2016-11-29 NOTE — Progress Notes (Signed)
Speech Language Pathology Discharge Summary  Patient Details  Name: Keith [name] MRN: [id number] Date of Birth: 01/11/1941  Today's Date: 11/29/2016 SLP Individual Time: 9794-9971 SLP Individual Time Calculation (min): 45 min   Skilled Therapeutic Interventions:  Skilled treatment session focused on cognition goals and completion of education. Strategies reviewed to help pt compensate for visual and mild memory impairments. Pt able to to create solutions and demonstrate understanding of suggestions made by SLP.      Patient has met 4 of 4 long term goals.  Patient to discharge at overall Modified Independent level.    Clinical Impression/Discharge Summary:   Pt has made great progress during skilled ST sessions and as a result he has met 4 of 4 LTGs and is discharging at a MOD I level. No further ST services are recommended at this time.   Care Partner:  Caregiver Able to Provide Assistance: Yes  Type of Caregiver Assistance: Cognitive  Recommendation:  None      Equipment:     Reasons for discharge: Treatment goals met   Patient/Family Agrees with Progress Made and Goals Achieved: Yes   Function:    Cognition Comprehension Comprehension assist level: Follows complex conversation/direction with extra time/assistive device;Follows complex conversation/direction with no assist  Expression   Expression assist level: Expresses complex ideas: With no assist;Expresses complex ideas: With extra time/assistive device  Social Interaction Social Interaction assist level: Interacts appropriately with others - No medications needed.  Problem Solving Problem solving assist level: Solves complex problems: Recognizes & self-corrects  Memory Memory assist level: More than reasonable amount of time;Complete Independence: No helper   Keith [name] 11/29/2016, 9:33 AM

## 2016-11-29 NOTE — Progress Notes (Signed)
Physical Therapy Session Note  Patient Details  Name: Keith [name] MRN: [id number] Date of Birth: 09-19-1940  Today's Date: 11/29/2016 PT Individual Time:1515-1545 PT Individual Time Calculation (min): 30 min   Short Term Goals: Week 1:  PT Short Term Goal 1 (Week 1): STG=LTG due to ELOS   Skilled Therapeutic Interventions/Progress Updates:   Pt received supine in bed and agreeable to PT. Supine>sit transfer with out assist or cues. PT instructed pt in gait training through various units of hospital including gait through hospital giftshop and lobby to simulate community environment. Pt also instructed in stair negotiation x 24 steps without assist from PT.  Patient returned to room and left sitting in [facility] with call bell in reach and all needs met.        Therapy Documentation Precautions:  Precautions Precautions: Fall Precaution Comments: hx of cataracts affecting functional vision Restrictions Weight Bearing Restrictions: No vital Signs: Therapy Vitals Temp: 97.7 F (36.5 C) Temp Source: Oral Pulse Rate: 67 Resp: 18 BP: (!) 128/59 Patient Position (if appropriate): Lying Oxygen Therapy SpO2: 100 % O2 Device: Not Delivered Pain:0/10  See Function Navigator for Current Functional Status.   Therapy/Group: Individual Therapy  Lorie Phenix 11/29/2016, 3:48 PM

## 2016-11-29 NOTE — Progress Notes (Signed)
Subjective/Complaints:  Discussed stroke etiology and risk factors.  Review of systems denies chest pain, shortness of breath, nausea, vomiting, diarrhea, constipation  Objective: Vital Signs: Blood pressure (!) 106/49, pulse (!) 54, temperature (!) 97.5 F (36.4 C), temperature source Oral, resp. rate 16, height _0  (1.753 m), weight 63.4 kg (139 lb 13.3 oz), SpO2 98 %. No results found. No results found for this or any previous visit (from the past 72 hour(s)).   HEENT: Scalp hemangioma at vertex, non tender Cardio: RRR and no murmur Resp: CTA B/L and unlabored GI: BS positive and non tender , non distended Extremity:  No Edema, weak pedal pulse but normal temp Skin:   Intact and Other loop recoder site with dressing intact , mild tenderness Neuro: Alert/Oriented, Abnormal Motor 4+ Right deltoid, bi,tri, grip, HF, KE ADF, 5/5 on Left side and Abnormal FMC Ataxic/ dec FMC Musc/Skel:  Other gait shows no evidence of toe drag, decreased arm swing and decreased trunkal rotation, decreased Right hallux ROM Gen NAD   Assessment/Plan: 1. Functional deficits secondary to Left MCA infarct which require 3+ hours per day of interdisciplinary therapy in a comprehensive inpatient rehab setting. Physiatrist is providing close team supervision and 24 hour management of active medical problems listed below. Physiatrist and rehab team continue to assess barriers to discharge/monitor patient progress toward functional and medical goals. FIM: Function - Bathing Position: Shower Body parts bathed by patient: Right arm, Left arm, Chest, Abdomen, Front perineal area, Buttocks, Right upper leg, Right lower leg, Left upper leg, Left lower leg, Back Assist Level: More than reasonable time  Function- Upper Body Dressing/Undressing What is the patient wearing?: Pull over shirt/dress Pull over shirt/dress - Perfomed by patient: Thread/unthread left sleeve, Put head through opening, Thread/unthread  right sleeve, Pull shirt over trunk Pull over shirt/dress - Perfomed by helper: Pull shirt over trunk Assist Level: More than reasonable time Set up : To obtain clothing/put away Function - Lower Body Dressing/Undressing What is the patient wearing?: Pants, Non-skid slipper socks, Underwear Position: Wheelchair/chair at sink Underwear - Performed by patient: Thread/unthread left underwear leg, Pull underwear up/down, Thread/unthread right underwear leg Underwear - Performed by helper: Thread/unthread right underwear leg Pants- Performed by patient: Thread/unthread left pants leg, Pull pants up/down, Thread/unthread right pants leg Pants- Performed by helper: Thread/unthread right pants leg Non-skid slipper socks- Performed by patient: Don/doff right sock, Don/doff left sock Non-skid slipper socks- Performed by helper: Don/doff right sock, Don/doff left sock Assist for footwear: Setup Assist for lower body dressing: Supervision or verbal cues  Function - Toileting Toileting activity did not occur: No continent bowel/bladder event Toileting steps completed by patient: Adjust clothing prior to toileting, Performs perineal hygiene, Adjust clothing after toileting Toileting Assistive Devices: Grab bar or rail Assist level: Touching or steadying assistance (Pt.75%)  Function - Air cabin crew transfer assistive device: Grab bar Assist level to toilet: Touching or steadying assistance (Pt > 75%) Assist level from toilet: Touching or steadying assistance (Pt > 75%)  Function - Chair/bed transfer Chair/bed transfer method: Stand pivot Chair/bed transfer assist level: Touching or steadying assistance (Pt > 75%)  Function - Locomotion: Wheelchair Will patient use wheelchair at discharge?: No Wheelchair activity did not occur: N/A Wheel 50 feet with 2 turns activity did not occur: N/A Wheel 150 feet activity did not occur: N/A Function - Locomotion: Ambulation Assistive device:  Cane-straight Max distance: (150 ft) Assist level: Supervision or verbal cues Assist level: Supervision or verbal cues Assist level: Supervision or  verbal cues Assist level: Supervision or verbal cues Assist level: Touching or steadying assistance (Pt > 75%)  Function - Comprehension Comprehension: Auditory Comprehension assist level: Follows complex conversation/direction with extra time/assistive device  Function - Expression Expression: Verbal Expression assist level: Expresses complex ideas: With extra time/assistive device  Function - Social Interaction Social Interaction assist level: Interacts appropriately with others - No medications needed.  Function - Problem Solving Problem solving assist level: Solves complex problems: Recognizes & self-corrects  Function - Memory Memory assist level: Recognizes or recalls 90% of the time/requires cueing < 10% of the time Patient normally able to recall (first 3 days only): Staff names and faces, That he or she is in a hospital, Current season  Medical Problem List and Plan: 1.right Side weakness secondary to left MCA infarction secondary to large vessel disease source of left ICA occlusion aspirin for CVA prophylaxis Cont  CIR PT OT, SLP , Team conference today please see physician documentation under team conference tab, met with team face-to-face to discuss problems,progress, and goals. Formulized individual treatment plan based on medical history, underlying problem and comorbidities.  Has Left ICA occlusion, Loop recorder placed to eval for arrythmia- neuro and cardiology f/u needed post d/c 2. DVT Prophylaxis/Anticoagulation: SCDs. Venous Dopplers negative 3. Pain Management:Tylenol as needed, RIght toe pain,better  4. Mood:Provide emotional support 5. Neuropsych: This patientiscapable of making decisions on hisown behalf. 6. Skin/Wound Care:Routine skin checks, outpatient follow-up with dermatology for hemangioma 7.  Fluids/Electrolytes/Nutrition:Routine I&O's with follow-up chemistries,8.Dysphagia. Dysphagia #3thin liquids. Follow-up speech therapy 9.Frontal scalp solid large nodule. Follow-up outpatient dermatology 10.CKDstage III. Follow-up chemistries GFR at baseline 11.Hyperlipidemia. Lipitor 12.Hypothyroidism. TSH 5.170. Patient on no thyroid medications prior to admission. LOS (Days) 6 A FACE TO FACE EVALUATION WAS PERFORMED  KIRSTEINS,ANDREW E 11/29/2016, 8:49 AM

## 2016-11-29 NOTE — Progress Notes (Signed)
Occupational Therapy Discharge Summary  Patient Details  Name: Keith [name] MRN: [id number] Date of Birth: [birth year]   Patient has met 70 of 10 long term goals due to improved activity tolerance, improved balance, postural control, functional use of  RIGHT upper extremity, improved attention, improved awareness and improved coordination.  Patient to discharge at overall Modified Independent level.  Patient's care partner is independent to provide the necessary physical assistance at discharge.    Recommendation:  Patient with no further OT needs at this time  Equipment: Pt reports son will obtain shower chair per OT recommendations  Reasons for discharge: treatment goals met and discharge from hospital  Patient/family agrees with progress made and goals achieved: Yes  OT Discharge Precautions/Restrictions  Precautions Precautions: Fall Precaution Comments: hx of cataracts affecting functional vision Restrictions Weight Bearing Restrictions: No ADL ADL ADL Comments: Please see functional navigator Vision Baseline Vision/History: Cataracts Patient Visual Report: No change from baseline Vision Assessment?: No apparent visual deficits Perception  Perception: Within Functional Limits Praxis Praxis: Intact Cognition Overall Cognitive Status: Within Functional Limits for tasks assessed Arousal/Alertness: Awake/alert Orientation Level: Oriented X4 Memory: Appears intact Awareness: Appears intact Problem Solving: Appears intact Problem Solving Impairment: Functional complex Safety/Judgment: Appears intact Sensation Sensation Light Touch: Impaired Detail(Reports some numbness along ulnar distributation area) Light Touch Impaired Details: Impaired RUE Proprioception: Appears Intact Coordination Gross Motor Movements are Fluid and Coordinated: Yes Fine Motor Movements are Fluid and Coordinated: Yes Motor  Motor Motor: Within Functional Limits Mobility  Bed  Mobility Bed Mobility: Rolling Right;Rolling Left;Supine to Sit;Sit to Supine Rolling Right: 6: Modified independent (Device/Increase time) Rolling Left: 6: Modified independent (Device/Increase time) Supine to Sit: 6: Modified independent (Device/Increase time) Sit to Supine: 6: Modified independent (Device/Increase time) Transfers Sit to Stand: 6: Modified independent (Device/Increase time)  Trunk/Postural Assessment  Cervical Assessment Cervical Assessment: Exceptions to WFL(Forard head) Thoracic Assessment Thoracic Assessment: Within Functional Limits Lumbar Assessment Lumbar Assessment: Within Functional Limits Postural Control Postural Control: Within Functional Limits  Balance Balance Balance Assessed: Yes Dynamic Sitting Balance Dynamic Sitting - Level of Assistance: 6: Modified independent (Device/Increase time) Static Standing Balance Static Standing - Balance Support: During functional activity Static Standing - Level of Assistance: 6: Modified independent (Device/Increase time) Dynamic Standing Balance Dynamic Standing - Level of Assistance: 6: Modified independent (Device/Increase time) Extremity/Trunk Assessment RUE Assessment RUE Assessment: Within Functional Limits RUE Strength RUE Overall Strength Comments: 4/5 throughout LUE Assessment LUE Assessment: Within Functional Limits   See Function Navigator for Current Functional Status.  Bobby Rumpf, Mcgregor Tinnon C 11/29/2016, 12:48 PM

## 2016-11-30 MED ORDER — ASPIRIN 81 MG PO TBEC: 81.0000 mg | DELAYED_RELEASE_TABLET | Freq: Every day | ORAL | Status: DC

## 2016-11-30 MED ORDER — ATORVASTATIN CALCIUM 40 MG PO TABS: 40.0000 mg | ORAL_TABLET | Freq: Every day | ORAL | 1 refills | Status: DC

## 2016-11-30 MED ORDER — TRAMADOL-ACETAMINOPHEN 37.5-325 MG PO TABS: 1.0000 | ORAL_TABLET | ORAL | 0 refills | Status: DC | PRN

## 2016-11-30 NOTE — Progress Notes (Signed)
Subjective/Complaints:    Review of systems denies chest pain, shortness of breath, nausea, vomiting, diarrhea, constipation  Objective: Vital Signs: Blood pressure (!) 112/50, pulse 61, temperature 97.8 F (36.6 C), temperature source Oral, resp. rate 16, height 5\' 9"  (1.753 m), weight 63.4 kg (139 lb 13.3 oz), SpO2 99 %. No results found. No results found for this or any previous visit (from the past 72 hour(s)).   HEENT: Scalp hemangioma at vertex, non tender Cardio: RRR and no murmur Resp: CTA B/L and unlabored GI: BS positive and non tender , non distended Extremity:  No Edema, weak pedal pulse but normal temp Skin:   Intact and Other loop recoder site with dressing intact , mild tenderness Neuro: Alert/Oriented, Abnormal Motor 4+ Right deltoid, bi,tri, grip, HF, KE ADF, 5/5 on Left side and Abnormal FMC Ataxic/ dec FMC Musc/Skel:  Other gait shows no evidence of toe drag, decreased arm swing and decreased trunkal rotation, decreased Right hallux ROM Gen NAD   Assessment/Plan: 1. Functional deficits secondary to Left MCA infarct  Stable for D/C today F/u PCP in 3-4 weeks F/u PM&R 2 weeks See D/C summary See D/C instructions- no work or driving for now   FIM: Function - Bathing Position: Research scientist (life sciences) parts bathed by patient: Right arm, Left arm, Chest, Abdomen, Front perineal area, Buttocks, Right upper leg, Right lower leg, Left upper leg, Left lower leg, Back Assist Level: More than reasonable time  Function- Upper Body Dressing/Undressing What is the patient wearing?: Pull over shirt/dress Pull over shirt/dress - Perfomed by patient: Thread/unthread left sleeve, Put head through opening, Thread/unthread right sleeve, Pull shirt over trunk Pull over shirt/dress - Perfomed by helper: Pull shirt over trunk Assist Level: No help, No cues Set up : To obtain clothing/put away Function - Lower Body Dressing/Undressing What is the patient wearing?: Pants, Non-skid slipper  socks, Underwear Position: Wheelchair/chair at sink Underwear - Performed by patient: Thread/unthread left underwear leg, Pull underwear up/down, Thread/unthread right underwear leg Underwear - Performed by helper: Thread/unthread right underwear leg Pants- Performed by patient: Thread/unthread left pants leg, Pull pants up/down, Thread/unthread right pants leg, Fasten/unfasten pants Pants- Performed by helper: Thread/unthread right pants leg Non-skid slipper socks- Performed by patient: Don/doff right sock, Don/doff left sock Non-skid slipper socks- Performed by helper: Don/doff right sock, Don/doff left sock Assist for footwear: Independent Assist for lower body dressing: More than reasonable time  Function - Toileting Toileting activity did not occur: No continent bowel/bladder event Toileting steps completed by patient: Adjust clothing prior to toileting, Performs perineal hygiene, Adjust clothing after toileting Toileting Assistive Devices: Grab bar or rail Assist level: More than reasonable time  Function - Air cabin crew transfer assistive device: Grab bar Assist level to toilet: No Help, no cues, assistive device, takes more than a reasonable amount of time Assist level from toilet: No Help, no cues, assistive device, takes more than a reasonable amount of time  Function - Chair/bed transfer Chair/bed transfer method: Stand pivot Chair/bed transfer assist level: No Help, no cues, assistive device, takes more than a reasonable amount of time  Function - Locomotion: Wheelchair Will patient use wheelchair at discharge?: No Wheelchair activity did not occur: N/A Wheel 50 feet with 2 turns activity did not occur: N/A Wheel 150 feet activity did not occur: N/A Function - Locomotion: Ambulation Assistive device: No device Max distance: 600 Assist level: No help, No cues, assistive device, takes more than a reasonable amount of time Assist level: No help, No cues,  assistive device, takes more than a reasonable amount of time Assist level: No help, No cues, assistive device, takes more than a reasonable amount of time Assist level: No help, No cues, assistive device, takes more than a reasonable amount of time Assist level: No help, No cues, assistive device, takes more than a reasonable amount of time  Function - Comprehension Comprehension: (P) Auditory Comprehension assist level: (P) Follows complex conversation/direction with extra time/assistive device, Follows complex conversation/direction with no assist  Function - Expression Expression: (P) Verbal Expression assist level: (P) Expresses complex ideas: With no assist, Expresses complex ideas: With extra time/assistive device  Function - Social Interaction Social Interaction assist level: (P) Interacts appropriately with others - No medications needed.  Function - Problem Solving Problem solving assist level: (P) Solves complex 90% of the time/cues < 10% of the time  Function - Memory Memory assist level: (P) More than reasonable amount of time Patient normally able to recall (first 3 days only): Staff names and faces, That he or she is in a hospital, Current season, Location of own room  Medical Problem List and Plan: 1.right Side weakness secondary to left MCA infarction secondary to large vessel disease source of left ICA occlusion aspirin for CVA prophylaxis  Stable for d/c today Has Left ICA occlusion, Loop recorder placed to eval for arrythmia- neuro and cardiology f/u needed post d/c 2. DVT Prophylaxis/Anticoagulation: SCDs. Venous Dopplers negative 3. Pain Management:Tylenol as needed, RIght toe pain,better  4. Mood:Provide emotional support 5. Neuropsych: This patientiscapable of making decisions on hisown behalf. 6. Skin/Wound Care:Routine skin checks, outpatient follow-up with dermatology for hemangioma 7. Fluids/Electrolytes/Nutrition:Routine I&O's with follow-up  chemistries,8.Dysphagia. Dysphagia #3thin liquids. Follow-up speech therapy 9.Frontal scalp solid large nodule. Follow-up outpatient dermatology 10.CKDstage III. Follow-up chemistries GFR at baseline 11.Hyperlipidemia. Lipitor 12.Hypothyroidism. TSH 5.170. Patient on no thyroid medications prior to admission. LOS (Days) 7 A FACE TO FACE EVALUATION WAS PERFORMED  Keith [name] 11/30/2016, 8:22 AM

## 2016-11-30 NOTE — Progress Notes (Signed)
Social Work  Discharge Note  The overall goal for the admission was met for:   Discharge location: [place] OUT  Length of Stay: Yes-7 DAYS  Discharge activity level: Yes-MOD/I LEVEL  Home/community participation: Yes  Services provided included: MD, RD, PT, OT, SLP, RN, TR, Pharmacy and SW  Financial Services: Medicare  Follow-up services arranged: Other: NO FOLLOW UP RECOMMENDED OR EQUIPMENT NEEDS  Comments (or additional information):FAILY WILL BE IN AND OUT WAS PRIOR TO ADMISSION-SON, SON'S GIRLFRIEND AND GRANDDAUGHTER. PT TO SIGN UP FOR MEDICARE PART B AND THEN FIND A PCP, CURRENTLY HAS NO MD COVERAGE  Patient/Family verbalized understanding of follow-up arrangements: Yes  Individual responsible for coordination of the follow-up plan: SELF & STEVEN-SON  Confirmed correct DME delivered: Elease Hashimoto 11/30/2016    Elease Hashimoto

## 2016-11-30 NOTE — Progress Notes (Signed)
Patient discharged to home accompanied by family

## 2016-12-04 ENCOUNTER — Other Ambulatory Visit: Payer: Self-pay

## 2016-12-04 NOTE — Patient Outreach (Signed)
[facility]) Care Management  12/04/2016  Keith [name] 1940-10-15 [id number]     EMMI-STROKE RED ON EMMI ALERT Day # 1 Date: 12/01/16 Red Alert Reason: " Scheduled a follow up appt? No   Filled new prescriptions? No  Been able to take every dose of meds? No   Problems setting up rehab? Yes"   Outreach attempt # 1 to patient. Spoke with patient. Reviewed and addressed red alerts. He voices that he recorded inaccurate responses for all of the questions. He states he did not understand what he was supposed to do. RN CM reviewed with patient how to respond and he voiced understanding. He states that he is doing pretty good except he still has some weakness but getting more strength is some of his extremities. He states that he is trying to stay as active as he can and has been walking around his neighborhood. Patient reports that he has very supportive son and girlfriend who check on his daily ad help him with his meds and MD appts. He voices he has all his meds. No issues or concerns regarding meds. His son's girlfriend has made all of his f/u appt. Patient voices that he has an appt tomorrow. He voices that he is in the process of signing up for another Medicare plan that will allow him more coverage and be able to find a PCP. He plans to complete his enrollment in the next few weeks and then obtain a PCP. No further RN CM needs or concerns at this time. Advised patient that they would continue to get automated EMMI-Stroke post discharge calls to assess how they are doing following recent hospitalization and will receive a call from a nurse if any of their responses were abnormal. Patient voiced understanding and was appreciative of f/u call.      Plan: RN CM will notify [facility] administrative assistant of case status.   [name], RN,BSN,CCM [place] Management Telephonic Care Management Coordinator Direct Phone: [phone] Toll Free: [phone] Fax:  [phone]

## 2016-12-05 ENCOUNTER — Ambulatory Visit (INDEPENDENT_AMBULATORY_CARE_PROVIDER_SITE_OTHER): Payer: Self-pay | Admitting: *Deleted

## 2016-12-05 DIAGNOSIS — I63512 Cerebral infarction due to unspecified occlusion or stenosis of left middle cerebral artery: Secondary | ICD-10-CM

## 2016-12-05 LAB — CUP PACEART INCLINIC DEVICE CHECK
Date Time Interrogation Session: 20181120120407
Implantable Pulse Generator Implant Date: 20181108

## 2016-12-05 NOTE — Progress Notes (Signed)
Wound check appointment. Steri-strips removed. Wound without redness or edema. Incision edges approximated, wound well healed. Battery status: Good. R-waves 1.0 mV. 0 symptom episodes, 0 tachy episodes, 0 pause episodes, 0 brady episodes. 0 AF episodes (0% burden). Monthly summary reports and ROV with JA PRN.

## 2016-12-11 ENCOUNTER — Other Ambulatory Visit: Payer: Self-pay

## 2016-12-11 NOTE — Patient Outreach (Signed)
[facility]) Care Management  12/11/2016  Gurpreet A Weger [birth year] [id number]     EMMI- STROKE RED ON EMMI ALERT Day # 9 Date: 12/09/16 Red Alert Reason: "Sad,hopeless, anxious or empty? Yes"   Outreach attempt # 1 to patient. Spoke with patient. He voices he is doing fairly well but still a Frew weak which is to be expected. He reports some kind of feeling where he feels like blood/pressure rushes to his head when he lies flat. He states he was having it in the hospital and mentioned it to MDs but they did not seem concerned about it. He plans to buy him a  BP monitor this week so he can start monitoring his BP at home. He denies any current BP issues and doesn't take any meds but just wants to start keeping an eye on BP. RN CM encouraged patient to seek medical attention for any worsening and/or unrelieved symptoms. He voiced understanding. He had an appt last week and goes to see neurologist 02/07/16. Reviewed and addressed red alert with patient. He voices that sometimes he feels a Millan sad and down as he has lost some of his independence. He voices having a very supportive son along with son's girlfriend who check on him daily and assist him. He desires to maintain as much independence as he possibly and safely can. He states he has asked his children not to do for him what he can do for himself as his independence means a lot to him. He also states that he has always been active and on the go his entire life and this illness has made him have to slow down some. Otherwise, patient states he is doing well. No further RN CM needs or concerns at this time. Advised patient that they would continue to get automated EMMI-Stroke post discharge calls to assess how they are doing following recent hospitalization and will receive a call from a nurse if any of their responses were abnormal. Patient voiced understanding and was appreciative of f/u call.      Plan: RN CM will  notify [facility] administrative assistant of case status.    [name], RN,BSN,CCM [place] Management Telephonic Care Management Coordinator Direct Phone: [phone] Toll Free: [phone] Fax: [phone]

## 2016-12-13 ENCOUNTER — Telehealth: Payer: Self-pay | Admitting: Neurology

## 2016-12-13 NOTE — Telephone Encounter (Signed)
Trish Williams/pt's son's girlfriend called said he is experiencing fatigue, rushing sound in his ears all the time, swimmy headed. The pt was in the room with her, she asked the pt for how long he had these symptoms, he answered her "since I was in the hospital". She takes care of the patient during the day, runs errands for him, she is disabled as well. She said his wife passed 2 yrs ago and she always took care of him, now it is just him and his son. Please call the patient at [phone].

## 2016-12-13 NOTE — Telephone Encounter (Signed)
Called pt back and he stated that he has been doing well after discharge. He still has weakness at right side, but getting better with therapy. Only thing is that when he was in hospital, he lay down at night and he could hear rushy whoosh sound in his head. After discharge, he still hear this sound at night when he lies down. This lasting about 30 min and resolved. During the day, he is good. For the last two nights, he had no problems.   I told him that this is most likely due to his BP changes. When BP was high on lying down, he may hear the rushy whoosh sound in the head. I asked him whether he can check BP at home. He said he will ask his son to buy one soon. I asked him to check BP lying down, sitting up and standing up. His BP goal 130-150 due to left ICA occlusion and right ICA stenosis. And he will let us know after he got his BP measures.   Rosalin Hawking, MD PhD Stroke Neurology 12/13/2016 5:55 PM

## 2016-12-14 ENCOUNTER — Telehealth: Payer: Self-pay | Admitting: Cardiology

## 2016-12-14 ENCOUNTER — Other Ambulatory Visit: Payer: Self-pay

## 2016-12-14 NOTE — Telephone Encounter (Signed)
Spoke w/ pt and requested that he send a manual transmission b/c his home monitor has not updated in at least 14 days.   

## 2016-12-14 NOTE — Patient Outreach (Signed)
[facility]) Care Management  12/14/2016  Keith [name] September 24, 1940 [id number]     EMMI-STROKE RED ON EMMI ALERT Day # 13 Date: 12/13/16 Red Alert Reason: "Went to follow-up appt? No"   Referral received. No outreach to patient warranted at this time. This red alert has been addressed on multiple previous calls. Patient not interested in finding a PCP. He chooses to f/u only with neurologist and has appt scheduled for 02/07/17. No further RN CM interventions needed at this time.       Plan: RN CM will notify [facility] administrative assistant of case status.    [name], RN,BSN,CCM [place] Management Telephonic Care Management Coordinator Direct Phone: [phone] Toll Free: [phone] Fax: [phone]

## 2016-12-22 ENCOUNTER — Encounter: Payer: Self-pay | Admitting: Physical Medicine & Rehabilitation

## 2016-12-22 ENCOUNTER — Encounter: Payer: Medicare Other | Attending: Physical Medicine & Rehabilitation

## 2016-12-22 ENCOUNTER — Ambulatory Visit (HOSPITAL_BASED_OUTPATIENT_CLINIC_OR_DEPARTMENT_OTHER): Payer: Self-pay | Admitting: Physical Medicine & Rehabilitation

## 2016-12-22 VITALS — BP 134/69 | HR 64

## 2016-12-22 DIAGNOSIS — E785 Hyperlipidemia, unspecified: Secondary | ICD-10-CM

## 2016-12-22 DIAGNOSIS — I639 Cerebral infarction, unspecified: Secondary | ICD-10-CM

## 2016-12-22 DIAGNOSIS — Z8673 Personal history of transient ischemic attack (TIA), and cerebral infarction without residual deficits: Secondary | ICD-10-CM

## 2016-12-22 DIAGNOSIS — I63512 Cerebral infarction due to unspecified occlusion or stenosis of left middle cerebral artery: Secondary | ICD-10-CM | POA: Insufficient documentation

## 2016-12-22 MED ORDER — ASPIRIN 81 MG PO TBEC: 81.0000 mg | DELAYED_RELEASE_TABLET | Freq: Every day | ORAL | Status: DC

## 2016-12-22 MED ORDER — ATORVASTATIN CALCIUM 40 MG PO TABS: 40.0000 mg | ORAL_TABLET | Freq: Every day | ORAL | 1 refills | Status: DC

## 2016-12-22 NOTE — Progress Notes (Signed)
Subjective:    Patient ID: Keith [name], male    DOB: November 23, 1940, 76 y.o.   MRN: [id number] DATE OF ADMISSION:  11/23/2016 DATE OF DISCHARGE:  11/30/2016 76 year old right-handed male with documented history of hypothyroidism; CKD, stage 3; as well as a 2.5 cm mass at the top of his skull that he had had for a number of years.  No prescription medications at time of admission.  He lives alone.  Works as a Presenter, broadcasting.  He has a son, who can arrange assistance as needed.  He presented on November 19, 2016, with headache, some incoordination of the right side.  Denied any recent trauma or fall.  Noted creatinine 2.38, TSH 5.170, troponin negative.  CT of the head reviewed, showed left SAH per report, trace left frontal acute to subacute subarachnoid hemorrhage.  MRI showed multifocal acute small frontal left MCA territory nonhemorrhagic infarction, 2.3 x 2.7 x 1.4 cm solid frontal scalp mass.  Carotid Doppler; 60% to 79% mid internal carotid artery stenosis, left internal carotid artery appeared occluded. MRA showed occluded left internal carotid artery with thready reconstitution, left supraclinoid segment.  Lower extremity Dopplers negative.  Echocardiogram with ejection fraction of 68%, grade 2 diastolic dysfunction   HPI  Mod I dressing and bathing, makes his own meals, does his laundry Walks about 1 mile a day No post D/C therapy due to insurance No falls , no dizziness Pt now able to write with R hand, able to pick up cards with R hand Some rushing in ears when laying down but this has improved about 60% since discharge from the hospital. 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  He walked the entire 11 floors 4 times per night as well as the parking lot.    Pain Inventory Average Pain 0 Pain Right Now 0 My pain is na  In the last 24 hours, has pain interfered  with the following? General activity 0 Relation with others 0 Enjoyment of life 0 What TIME of day is your pain at its worst? na Sleep (in general) na  Pain is worse with: na Pain improves with: na Relief from Meds: na  Mobility walk without assistance ability to climb steps?  yes do you drive?  no  Function employed # of hrs/week 40  Neuro/Psych weakness dizziness confusion  Prior Studies Any changes since last visit?  no  Physicians involved in your care Any changes since last visit?  no   Family History  Problem Relation Age of Onset  . Lung cancer Father    Social History   Socioeconomic History  . Marital status: Married    Spouse name: None  . Number of children: None  . Years of education: None  . Highest education level: None  Social Needs  . Financial resource strain: None  . Food insecurity - worry: None  . Food insecurity - inability: None  . Transportation needs - medical: None  . Transportation needs - non-medical: None  Occupational History  . None  Tobacco Use  . Smoking status: Never Smoker  . Smokeless tobacco: Never Used  Substance and Sexual Activity  . Alcohol use: No  . Drug use: No  . Sexual activity: None  Other Topics Concern  . None  Social History Narrative  . None   Past Surgical History:  Procedure Laterality Date  .  IR ANGIO INTRA EXTRACRAN SEL COM CAROTID INNOMINATE BILAT MOD SED  11/23/2016  . IR ANGIO VERTEBRAL SEL VERTEBRAL UNI L MOD SED  11/23/2016  . LOOP RECORDER INSERTION N/A 11/23/2016   Procedure: LOOP RECORDER INSERTION;  Surgeon: [name], MD;  Location: [place] [facility];  Service: Cardiovascular;  Laterality: N/A;   Past Medical History:  Diagnosis Date  . Hypothyroidism   . Obstructive uropathy    BP 134/69   Pulse 64   SpO2 97%   Opioid Risk Score:   Fall Risk Score:  `1  Depression screen PHQ 2/9  No flowsheet data found.   Review of Systems  Constitutional: Negative.   HENT:  Negative.   Eyes: Negative.   Respiratory: Negative.   Cardiovascular: Negative.   Gastrointestinal: Negative.   Endocrine: Negative.   Genitourinary: Negative.   Musculoskeletal: Negative.   Skin: Negative.   Allergic/Immunologic: Negative.   Neurological: Negative.   Hematological: Negative.   Psychiatric/Behavioral: Negative.   All other systems reviewed and are negative.      Objective:   Physical Exam  Constitutional: He is oriented to person, place, and time. He appears well-developed and well-nourished. No distress.  HENT:  Head: Normocephalic and atraumatic.  Eyes: Conjunctivae and EOM are normal. Pupils are equal, round, and reactive to light.  Cardiovascular: Normal rate, regular rhythm and normal heart sounds. Exam reveals no friction rub.  No murmur heard. Pulmonary/Chest: Effort normal and breath sounds normal. No respiratory distress. He has no wheezes.  Abdominal: Soft. Bowel sounds are normal. He exhibits no distension. There is no tenderness.  Musculoskeletal: Normal range of motion.  Neurological: He is alert and oriented to person, place, and time. No cranial nerve deficit. Coordination and gait normal.  Motor strength is 5/5 bilateral deltoid, bicep, tricep, grip, hip flexor, knee extensor, ankle dorsiflexor Patient is able to Toe Walk and Heel Walk.  Mild difficulty with tandem gait  Skin: Skin is warm and dry. He is not diaphoretic.  Large hematoma vertex of scalp  Psychiatric: He has a normal mood and affect. His behavior is normal. Judgment and thought content normal.  Nursing note and vitals reviewed.  Sensation equal bilateral upper and lower limbs Speech without evidence of dysarthria or aphasia.       Assessment & Plan:  1.  Left MCA distribution infarct as well as a small left subarachnoid hemorrhage.  He has no significant residual deficits at the current time.  He does have some problem with endurance since the stroke compared to his prior  level.  We discussed that he can slowly increase his walking distance. No return to work until he can increase his endurance, his work activity required walking at least 3 miles per night Patient will follow-up with neurology on January 23  2.  History of hyperlipidemia, stage III chronic kidney disease, needs PCP.  Referral to community health and wellness since he does not have Medicare part B  3.  Scalp lesion appears to be hemangioma, primary care to make referral to appropriate specialist.  No physical medicine and rehabilitation follow-up required No follow-up therapy is required, continue home exercise program

## 2016-12-28 DIAGNOSIS — Z006 Encounter for examination for normal comparison and control in clinical research program: Secondary | ICD-10-CM

## 2016-12-28 NOTE — Progress Notes (Unsigned)
STROKE-AF Research study month 1 follow up visit completed. Loop interrogated with no AF detected. No changes in his medication or health. Educated patient about manual transmission. Nest research required visit is a transmission in [place].

## 2017-01-17 ENCOUNTER — Other Ambulatory Visit: Payer: Self-pay | Admitting: Internal Medicine

## 2017-01-23 ENCOUNTER — Telehealth: Payer: Self-pay | Admitting: Cardiology

## 2017-01-23 NOTE — Telephone Encounter (Signed)
LMOVM requesting that pt send manual transmission b/c home monitor has not updated in at least 14 days.    

## 2017-02-01 ENCOUNTER — Encounter: Payer: Self-pay | Admitting: Cardiology

## 2017-02-07 ENCOUNTER — Ambulatory Visit (INDEPENDENT_AMBULATORY_CARE_PROVIDER_SITE_OTHER): Payer: Medicare Other | Admitting: Neurology

## 2017-02-07 ENCOUNTER — Encounter: Payer: Self-pay | Admitting: Neurology

## 2017-02-07 VITALS — BP 110/60 | HR 64 | Ht 69.0 in | Wt 119.6 lb

## 2017-02-07 DIAGNOSIS — I63232 Cerebral infarction due to unspecified occlusion or stenosis of left carotid arteries: Secondary | ICD-10-CM | POA: Diagnosis not present

## 2017-02-07 DIAGNOSIS — D492 Neoplasm of unspecified behavior of bone, soft tissue, and skin: Secondary | ICD-10-CM | POA: Diagnosis not present

## 2017-02-07 DIAGNOSIS — I6521 Occlusion and stenosis of right carotid artery: Secondary | ICD-10-CM | POA: Diagnosis not present

## 2017-02-07 DIAGNOSIS — I6522 Occlusion and stenosis of left carotid artery: Secondary | ICD-10-CM | POA: Diagnosis not present

## 2017-02-07 DIAGNOSIS — E785 Hyperlipidemia, unspecified: Secondary | ICD-10-CM | POA: Diagnosis not present

## 2017-02-07 DIAGNOSIS — H269 Unspecified cataract: Secondary | ICD-10-CM | POA: Diagnosis not present

## 2017-02-07 MED ORDER — ASPIRIN EC 325 MG PO TBEC: 325.0000 mg | DELAYED_RELEASE_TABLET | Freq: Every day | ORAL | 0 refills | Status: DC

## 2017-02-07 NOTE — Patient Instructions (Addendum)
-   continue lipitor for stroke prevention - increase baby ASA to full dose ASA daily - check BP at home and record. BP goal 120-140. Avoid dehydration - Follow up with your primary care physician for stroke risk factor modification. Recommend maintain blood pressure goal <130/80, diabetes with hemoglobin A1c goal below 7.0% and lipids with LDL cholesterol goal below 70 mg/dL.  - will refer to dermatology and eye doctor for follow up  - follow up with other specialties before back to work. - healthy diet and regular exercise  - follow up in 3 months.

## 2017-02-08 DIAGNOSIS — I6522 Occlusion and stenosis of left carotid artery: Secondary | ICD-10-CM | POA: Insufficient documentation

## 2017-02-08 DIAGNOSIS — I6521 Occlusion and stenosis of right carotid artery: Secondary | ICD-10-CM | POA: Insufficient documentation

## 2017-02-08 DIAGNOSIS — E785 Hyperlipidemia, unspecified: Secondary | ICD-10-CM | POA: Insufficient documentation

## 2017-02-08 DIAGNOSIS — H269 Unspecified cataract: Secondary | ICD-10-CM | POA: Insufficient documentation

## 2017-02-08 NOTE — Progress Notes (Signed)
STROKE NEUROLOGY FOLLOW UP NOTE  NAME: Keith [name] DOB: [birth year]  REASON FOR VISIT: stroke follow up HISTORY FROM: pt and son and daughter and chart  Today we had the pleasure of seeing Keith [name] in follow-up at our Neurology Clinic. Pt was accompanied by son and daughter.   History Summary Keith [name] a 77 y.o.malenever sees doctors before admitted for right side weakness and slurry speech. CT head showed trace left frontal SAH. MRI left MCA infarct. MRA showed left ICA occlusion. CUS left ICA occlusion but also right ICA 60-79% stenosis. Repeat CT showed resolution of trace SAH. DSA further confirmed left ICA occlusion but right ICA only 45% stenosis. EF 55-60%. LE venous doppler negative. LDL 125 and A1C 5.5. Due to questionable SAH, he was only discharged on ASA 81mg  and lipitor 40 to CIR.   Interval History During the interval time, the patient has been doing well. Neuro deficit resolved with therapy. Not checking BP at home today in clinic 110/60. Has lost 20lb but stabilized. At home doing regular exercise with walking and sleeps well. Has not had called for dermatology appointment yet. Also complains of blurry vision would like to see an eye doctor. On STROKE AF trial, so far no afib.  REVIEW OF SYSTEMS: Full 14 system review of systems performed and notable only for those listed below and in HPI above, all others are negative:  Constitutional:  Activity change, fatigue Cardiovascular:  Ear/Nose/Throat:  Ringing in ears, runny nose Skin:  Eyes:  Loss of vision, blurry vision Respiratory:   Gastroitestinal:   Genitourinary:  Hematology/Lymphatic:  Bruise easily Endocrine:  Musculoskeletal:   Allergy/Immunology:   Neurological:  dizziness Psychiatric: agitation, nervous, anxious Sleep: restless leg  The following represents the patient's updated allergies and side effects list: Allergies  Allergen Reactions  . Penicillins Other (See Comments)   unknown    The neurologically relevant items on the patient's problem list were reviewed on today's visit.  Neurologic Examination  A problem focused neurological exam (12 or more points of the single system neurologic examination, vital signs counts as 1 point, cranial nerves count for 8 points) was performed.  Blood pressure 110/60, pulse 64, height 5\' 9"  (1.753 m), weight 119 lb 9.6 oz (54.3 kg).  General - Well nourished, well developed, in no apparent distress.  Ophthalmologic - Sharp disc margins OU.   Cardiovascular - Regular rate and rhythm.  Mental Status -  Level of arousal and orientation to time, place, and person were intact. Language including expression, naming, repetition, comprehension was assessed and found intact. Attention span and concentration were normal. Fund of Knowledge was assessed and was intact.  Cranial Nerves II - XII - II - Visual field intact OU. III, IV, VI - Extraocular movements intact. V - Facial sensation intact bilaterally. VII - Facial movement intact bilaterally. VIII - Hearing & vestibular intact bilaterally. X - Palate elevates symmetrically. XI - Chin turning & shoulder shrug intact bilaterally. XII - Tongue protrusion intact.  Motor Strength - The patient's strength was normal in all extremities and pronator drift was absent.  Bulk was normal and fasciculations were absent.   Motor Tone - Muscle tone was assessed at the neck and appendages and was normal.  Reflexes - The patient's reflexes were 1+ in all extremities and he had no pathological reflexes.  Sensory - Light touch, temperature/pinprick, vibration and proprioception, and Romberg testing were assessed and were normal.    Coordination - The patient had  normal movements in the hands and feet with no ataxia or dysmetria.  Tremor was absent.  Gait and Station - The patient's transfers, posture, gait, station, and turns were observed as normal.   Functional score  mRS =  0   0 - No symptoms.   1 - No significant disability. Able to carry out all usual activities, despite some symptoms.   2 - Slight disability. Able to look after own affairs without assistance, but unable to carry out all previous activities.   3 - Moderate disability. Requires some help, but able to walk unassisted.   4 - Moderately severe disability. Unable to attend to own bodily needs without assistance, and unable to walk unassisted.   5 - Severe disability. Requires constant nursing care and attention, bedridden, incontinent.   6 - Dead.   NIH Stroke Scale = 0   Data reviewed: I personally reviewed the images and agree with the radiology interpretations.  Ct Head Wo Contrast 11/19/2016 IMPRESSION: 1. Trace LEFT frontal acute to subacute subarachnoid hemorrhage. Recommend MRI of the brain on nonemergent basis. 2. Otherwise negative CT HEAD for age. 3. New 2.5 cm soft tissue mass LEFT frontal scalp concerning for skin cancer. Recommend dermatologic consultation on a nonemergent basis.   Mri and Mra Brain Wo Contrast 11/20/2016 IMPRESSION: MRI HEAD: 1. Multifocal acute small frontal/ LEFT MCA territory nonhemorrhagic infarct. 2. 3 x 2.7 x 1.4 cm solid frontal scalp mass consistent with neoplasm, no calvarial invasion. Recommend non emergent dermatologic consultation. 3. No MR correlate for suspected LEFT frontal lobe subarachnoid hemorrhage ; please note MRI is frequently insensitive to hyperacute subarachnoid hemorrhage. 4. Recommend CTA HEAD and neck for further characterization. MRA HEAD: 1. Occluded LEFT internal carotid artery with thready reconstitution LEFT supraclinoid segment. 2. Slow flow LEFT middle cerebral artery, occluded LEFT M2 segment. 3. Amorphous LEFT A1-2 junction, this could reflect small aneurysm, fenestration or turbulent retrograde flow. 4. Moderate stenosis RIGHT supraclinoid internal carotid artery. Suspected severe stenosis RIGHT A1 origin. 5. Recommend CTA  HEAD and neck for further characterization.   Carotid Doppler Right 60-79% mid internal carotid artery stenosis, left internal carotid artery appears occluded. Bilateral vertebral arteries appear patent and antegrade.  2D Echocardiogram Impressions: - Normal LV size with EF 55-60%. Moderate diastolic dysfunction. Normal RV size and systolic function. Mild aortic insufficiency.  LE venous doppler  No evidence of deep vein thrombosis or baker's cystsbilaterally.  Ct Head Wo Contrast 11/21/2016 IMPRESSION: 1. No subarachnoid hemorrhage on today's CT. 2. Evolving nonhemorrhagic small LEFT frontal/MCA territory infarct. 3. 2.6 cm soft tissue mass LEFT frontal scalp, recommend nonemergent dermal a Hologic consultation.     Component     Latest Ref Rng & Units 11/20/2016 11/21/2016  Cholesterol     0 - 200 mg/dL 183   Triglycerides     <150 mg/dL 115   HDL Cholesterol     >40 mg/dL 35 (L)   Total CHOL/HDL Ratio     RATIO 5.2   VLDL     0 - 40 mg/dL 23   LDL (calc)     0 - 99 mg/dL 125 (H)   Hemoglobin A1C     4.8 - 5.6 % 5.5   Mean Plasma Glucose     mg/dL 111.15   TSH     0.350 - 4.500 uIU/mL 5.170 (H)   T4,Free(Direct)     0.61 - 1.12 ng/dL  0.72    Assessment: As you may recall, he is a 76  y.o. Caucasian male with PMH of never sees doctors before admitted for right side weakness and slurry speech. CT head showed trace left frontal SAH. MRI left MCA infarct. MRA showed left ICA occlusion. CUS left ICA occlusion but also right ICA 60-79% stenosis. Repeat CT showed resolution of trace SAH. DSA further confirmed left ICA occlusion but right ICA only 45% stenosis. EF 55-60%. LE venous doppler negative. LDL 125 and A1C 5.5. Discharged on ASA 81mg  and lipitor 40 to CIR. Neuro deficit resolved with therapy. Has not had called for dermatology appointment yet for his scalp mass. Also complains of blurry vision would like to see an eye doctor for cataracts. On STROKE AF trial, so  far no afib.  Plan:  - continue lipitor for stroke prevention - increase baby ASA to full dose ASA daily - check BP at home and record. BP goal 120-140. Avoid dehydration - repeat carotid doppler next visit and consider vascular surgery referral for continued right ICA stenosis monitoring  - Follow up with your primary care physician for stroke risk factor modification. Recommend maintain blood pressure goal <130/80, diabetes with hemoglobin A1c goal below 7.0% and lipids with LDL cholesterol goal below 70 mg/dL.  - will refer to dermatology and eye doctor for follow up  - follow up with other specialties before back to work. - healthy diet and regular exercise  - follow up in 3 months.   I spent more than 25 minutes of face to face time with the patient. Greater than 50% of time was spent in counseling and coordination of care. We discussed about further referral, BP management, and back to work recommendations.  Orders Placed This Encounter  Procedures  . Ambulatory referral to Dermatology    Referral Priority:   Routine    Referral Type:   Consultation    Referral Reason:   Specialty Services Required    Requested Specialty:   Dermatology    Number of Visits Requested:   1  . Ambulatory referral to Ophthalmology    Referral Priority:   Routine    Referral Type:   Consultation    Referral Reason:   Specialty Services Required    Requested Specialty:   Ophthalmology    Number of Visits Requested:   1    Meds ordered this encounter  Medications  . aspirin EC 325 MG tablet    Sig: Take 1 tablet (325 mg total) by mouth daily.    Dispense:  30 tablet    Refill:  0    Patient Instructions  - continue lipitor for stroke prevention - increase baby ASA to full dose ASA daily - check BP at home and record. BP goal 120-140. Avoid dehydration - Follow up with your primary care physician for stroke risk factor modification. Recommend maintain blood pressure goal <130/80, diabetes with  hemoglobin A1c goal below 7.0% and lipids with LDL cholesterol goal below 70 mg/dL.  - will refer to dermatology and eye doctor for follow up  - follow up with other specialties before back to work. - healthy diet and regular exercise  - follow up in 3 months.    Rosalin Hawking, MD PhD [facility] [address] [place], [place] [address] [phone]

## 2017-02-13 ENCOUNTER — Encounter: Payer: Self-pay | Admitting: *Deleted

## 2017-02-13 DIAGNOSIS — Z006 Encounter for examination for normal comparison and control in clinical research program: Secondary | ICD-10-CM

## 2017-02-13 NOTE — Progress Notes (Signed)
STROKE-AF Research study 3 month manuel transmission completed on 23/Jan/19.

## 2017-02-26 DIAGNOSIS — H401134 Primary open-angle glaucoma, bilateral, indeterminate stage: Secondary | ICD-10-CM | POA: Diagnosis not present

## 2017-02-26 DIAGNOSIS — H2513 Age-related nuclear cataract, bilateral: Secondary | ICD-10-CM | POA: Diagnosis not present

## 2017-03-01 ENCOUNTER — Encounter: Payer: Self-pay | Admitting: Cardiology

## 2017-03-08 DIAGNOSIS — D485 Neoplasm of uncertain behavior of skin: Secondary | ICD-10-CM | POA: Diagnosis not present

## 2017-03-12 DIAGNOSIS — H2513 Age-related nuclear cataract, bilateral: Secondary | ICD-10-CM | POA: Diagnosis not present

## 2017-03-12 DIAGNOSIS — R220 Localized swelling, mass and lump, head: Secondary | ICD-10-CM | POA: Diagnosis not present

## 2017-03-12 DIAGNOSIS — H401134 Primary open-angle glaucoma, bilateral, indeterminate stage: Secondary | ICD-10-CM | POA: Diagnosis not present

## 2017-03-13 NOTE — H&P (Signed)
Subjective:     Patient ID: Keith [name] is a 77 y.o. male.  HPI  Referred by Dr. Ronnald Ramp for evaluation. Patient was admitted to [facility] 11/4/2018with acute CVA. He underwent cerebral angiogram and has found to have an occluded Lt ICA prox with partial distal reconstitution from the ipsilateral OA. He was noted on admission to have scalp mass that had been present for years over 2 years. Son notes trauma to scalp 4-5 years ago had mass that waxed and waned, then for last 2 years mass that has had continuous growth. MRI findings as below. No prior biopsy. Dr. Ronnald Ramp concerned about in office biopsy due to vascular appearance. He is on aspirin alone.  Worked as Presenter, broadcasting, currently stopped working. Accompanied by son and his family. Son does have medical POA. No prior skin cancers, Denies similar masses. Scheduled for cataract Dr. Katy Fitch 4.8.19  EXAM: MRI HEAD WITHOUT CONTRAST  MRA HEAD WITHOUT CONTRAST  TECHNIQUE: Multiplanar, multiecho pulse sequences of the brain and surrounding structures were obtained without intravenous contrast. Angiographic images of the head were obtained using MRA technique without contrast.  COMPARISON: CT HEAD November 19, 2016  FINDINGS: MRI HEAD FINDINGS  BRAIN: Patchy reduced diffusion LEFT insula, LEFT frontal lobe extending to the operculum with low ADC values and faint T2 hyperintense signal. No abnormal intracranial susceptibility artifact. Focal susceptibility artifact and T1 shortening LEFT MCA at the level of the insula. Ventricles and sulci are normal for patient's. No additional levels of with normal signal. No midline shift, mass effect or masses. No abnormal extra-axial fluid collections.  VASCULAR: T2 bright signal LEFT internal carotid artery from cervical segment to supraclinoid segment.  SKULL AND UPPER CERVICAL SPINE: No abnormal sellar expansion. No suspicious calvarial bone marrow signal. Craniocervical  junction maintained. 3 x 2.7 x 1.4 cm (AP by transverse by CC) solid midline frontal scalp mass without calvarial invasion.  SINUSES/ORBITS: Chronic LEFT mastoid effusion. Paranasal sinus are well aerated. The included ocular globes and orbital contents are non-suspicious.  OTHER: Patient is edentulous.  MRA HEAD FINDINGS  ANTERIOR CIRCULATION: Complete loss of flow related enhancement LEFT internal carotid artery from the included cervical segment. Thready reconstitution supraclinoid LEFT internal carotid artery. Normal flow related enhancement of the RIGHT included cervical, petrous, cavernous and supraclinoid internal carotid arteries. Moderate stenosis RIGHT supraclinoid internal carotid artery stenosis. Patent anterior communicating artery, amorphous LEFT A1-2 junction, possible fenestrated versus small aneurysm. Suspected severe stenosis RIGHT A1 origin. Patent presumable retrograde flow LEFT A1 segment. Thready flow related enhancement LEFT middle cerebral artery, occluded LEFT M2 segment. Normal flow related enhancement RIGHT middle cerebral artery.  POSTERIOR CIRCULATION: RIGHT vertebral artery is dominant. Basilar artery is patent, with normal flow related enhancement of the main branch vessels. Fenestrated vertebrobasilar junction. Patent posterior cerebral arteries. Posterior communicating artery's not identified.  No large vessel occlusion, flow limiting stenosis, aneurysm.  ANATOMIC VARIANTS: None.  Source images and MIP images were reviewed.  IMPRESSION: MRI HEAD:  1. Multifocal acute small frontal/ LEFT MCA territory nonhemorrhagic infarct. 2. 3 x 2.7 x 1.4 cm solid frontal scalp mass consistent with neoplasm, no calvarial invasion. Recommend non emergent dermatologic consultation. 3. No MR correlate for suspected LEFT frontal lobe subarachnoid hemorrhage ; please note MRI is frequently insensitive to hyperacute subarachnoid hemorrhage. 4.  Recommend CTA HEAD and neck for further characterization. MRA HEAD:  1. Occluded LEFT internal carotid artery with thready reconstitution LEFT supraclinoid segment. 2. Slow flow LEFT middle cerebral artery, occluded LEFT M2 segment. 3.  Amorphous LEFT A1-2 junction, this could reflect small aneurysm, fenestration or turbulent retrograde flow. 4. Moderate stenosis RIGHT supraclinoid internal carotid artery. Suspected severe stenosis RIGHT A1 origin. 5. Recommend CTA HEAD and neck for further characterization.  Acute findings discussed with and reconfirmed by Dr.Kirkpatrick, Neurology on 11/20/2016 at 2:15 am.   Electronically Signed By: Elon Alas M.D. On: 11/20/2016 02:45    Review of Systems     Objective:   Physical Exam  Constitutional: He is oriented to person, place, and time.  Cardiovascular: Normal rate, regular rhythm and normal heart sounds.   Pulmonary/Chest: Effort normal and breath sounds normal.  Neurological: He is alert and oriented to person, place, and time.   HEENT: 4 x 4.5 cm non mobile firm mass, blue color with telangiectasia LYMPH:No cervical or occipital or supraclavicular adenopathy    Assessment:     Neoplasm scalp    Plan:     Unclear etiology mass. Prior to wide excision would like biopsy to guide management. Plan incisional biopsy in OR under sedation. Plan OP surgery. Reviewed differential includes skin cancer, vascular lesions (though appears solid mass on imaging), lymphoma, sarcomas.  Irene Limbo, MD [facility] [phone], pin [phone]

## 2017-03-19 ENCOUNTER — Other Ambulatory Visit: Payer: Self-pay

## 2017-03-19 ENCOUNTER — Encounter (HOSPITAL_COMMUNITY): Payer: Self-pay | Admitting: *Deleted

## 2017-03-19 NOTE — Progress Notes (Signed)
Anesthesia Chart Review:  Pt is a same day work up  Pt is a 77 year old male scheduled for excision scalp lesion on 03/20/2017 Irene Limbo, MD  - No PCP. Was referred to [facility] but has not been able to get appointment.  - Neurologist is Rosalin Hawking, MD  PMH includes:  Stroke (11/19/16), loop recorder (inserted 11/23/16), carotid artery stenosis (L ICA occluded), hyperlipidemia, hypothyroidism. Never smoker. BMI 18  - Hospitalized 54/0-98/1/19 for embolic stroke, subarachnoid hemorrhage.    Medications: ASA 325mg , lipitor  Labs will be obtained day of surgery - Labs from November 2018 indicate poor renal function. Cr 2.09-2.38 during hospitalization for stroke  EKG will be obtained day of surgery  Carotid, innominate, vertebral artery angiograms 11/23/16:  - Angiographically occluded left internal carotid artery at the bulb without evidence of an angiographic string sign. - Distal reconstitution of the distal cavernous and the petrous left ICA via the ophthalmic artery from the nasolacrimal branches of the left external carotid artery. - Antegrade flow noted into the supraclinoid left ICA and subsequently minimally the left middle cerebral artery distribution. - Approximately 45% stenosis of the right internal carotid artery proximally associated with a small ulcerated plaque. - Intracranial mild atherosclerotic changes involving both internal carotid arteries.  Echo 11/21/16:  - Left ventricle: The cavity size was normal. Wall thickness was normal. Systolic function was normal. The estimated ejection fraction was in the range of 55% to 60%. Wall motion was normal; there were no regional wall motion abnormalities. Features are consistent with a pseudonormal left ventricular filling pattern, with concomitant abnormal relaxation and increased filling pressure (grade 2 diastolic dysfunction). - Aortic valve: There was no stenosis. There was mild  regurgitation. - Mitral valve: There was trivial regurgitation. - Right ventricle: The cavity size was normal. Systolic function was normal. - Pulmonary arteries: No complete TR doppler jet so unable to estimate PA systolic pressure. - Inferior vena cava: The vessel was normal in size. The respirophasic diameter changes were in the normal range (>= 50%), consistent with normal central venous pressure. - Impressions: Normal LV size with EF 55-60%. Moderate diastolic dysfunction. Normal RV size and systolic function. Mild aortic insufficiency.  Carotid duplex 11/20/16:  - Right Carotid: There is evidence in the right ICA of a 60-79% stenosis. - Left Carotid: The left ICA appears occluded. - Vertebrals: Both vertebral arteries were patent with antegrade flow.  If labs and EKG acceptable day of surgery, I anticipate pt can proceed with surgery as scheduled.   Willeen Cass, FNP-BC [facility]/Anesthesiology Phone: [phone] 03/19/2017 3:07 PM

## 2017-03-19 NOTE — Progress Notes (Signed)
Spoke with pt's son, [name] for pre-op call. He states pt does not have cardiac history. States he had a stroke in November and he has a loop recorder. States pt rarely, if ever went to a doctor prior to the stroke. Family is not aware that pt has chronic kidney disease. [name] states pt was referred to the [facility] for follow-up after discharge from hospital but he states they are not accepting new patients. I suggested to him that he try any primary care physician in town since pt has Medicare and can go most anywhere. He states he will call in the AM to get an appt with someone.

## 2017-03-20 ENCOUNTER — Encounter (HOSPITAL_COMMUNITY): Payer: Self-pay | Admitting: *Deleted

## 2017-03-20 ENCOUNTER — Other Ambulatory Visit (HOSPITAL_COMMUNITY)
Admission: RE | Admit: 2017-03-20 | Discharge: 2017-03-20 | Disposition: A | Discharge status: Home / Self Care | Payer: Medicare Other | Source: Ambulatory Visit | Attending: Oncology | Admitting: Oncology

## 2017-03-20 ENCOUNTER — Ambulatory Visit (HOSPITAL_COMMUNITY)
Admission: RE | Admit: 2017-03-20 | Discharge: 2017-03-20 | Disposition: A | Discharge status: Home / Self Care | Payer: Medicare Other | Source: Ambulatory Visit | Attending: Plastic Surgery | Admitting: Plastic Surgery

## 2017-03-20 ENCOUNTER — Ambulatory Visit (HOSPITAL_COMMUNITY): Payer: Medicare Other | Admitting: Emergency Medicine

## 2017-03-20 ENCOUNTER — Encounter (HOSPITAL_COMMUNITY)
Admission: RE | Disposition: A | Discharge status: Home / Self Care | Payer: Self-pay | Source: Ambulatory Visit | Attending: Plastic Surgery

## 2017-03-20 DIAGNOSIS — C434 Malignant melanoma of scalp and neck: Secondary | ICD-10-CM | POA: Insufficient documentation

## 2017-03-20 DIAGNOSIS — R112 Nausea with vomiting, unspecified: Secondary | ICD-10-CM

## 2017-03-20 DIAGNOSIS — Z9889 Other specified postprocedural states: Secondary | ICD-10-CM

## 2017-03-20 DIAGNOSIS — Z8673 Personal history of transient ischemic attack (TIA), and cerebral infarction without residual deficits: Secondary | ICD-10-CM | POA: Insufficient documentation

## 2017-03-20 DIAGNOSIS — Z7982 Long term (current) use of aspirin: Secondary | ICD-10-CM | POA: Insufficient documentation

## 2017-03-20 DIAGNOSIS — C439 Malignant melanoma of skin, unspecified: Secondary | ICD-10-CM | POA: Diagnosis not present

## 2017-03-20 DIAGNOSIS — I080 Rheumatic disorders of both mitral and aortic valves: Secondary | ICD-10-CM | POA: Diagnosis not present

## 2017-03-20 DIAGNOSIS — R739 Hyperglycemia, unspecified: Secondary | ICD-10-CM | POA: Diagnosis not present

## 2017-03-20 DIAGNOSIS — E039 Hypothyroidism, unspecified: Secondary | ICD-10-CM | POA: Diagnosis not present

## 2017-03-20 DIAGNOSIS — R220 Localized swelling, mass and lump, head: Secondary | ICD-10-CM | POA: Diagnosis not present

## 2017-03-20 DIAGNOSIS — I6523 Occlusion and stenosis of bilateral carotid arteries: Secondary | ICD-10-CM | POA: Diagnosis not present

## 2017-03-20 DIAGNOSIS — E785 Hyperlipidemia, unspecified: Secondary | ICD-10-CM | POA: Diagnosis not present

## 2017-03-20 HISTORY — DX: Chronic kidney disease, unspecified: N18.9

## 2017-03-20 HISTORY — DX: Other specified postprocedural states: Z98.890

## 2017-03-20 HISTORY — PX: EXCISION MASS HEAD: SHX6702

## 2017-03-20 HISTORY — DX: Other specified postprocedural states: R11.2

## 2017-03-20 LAB — BASIC METABOLIC PANEL
Anion gap: 7 (ref 5–15)
BUN: 21 mg/dL — ABNORMAL HIGH (ref 6–20)
CO2: 26 mmol/L (ref 22–32)
Calcium: 9.2 mg/dL (ref 8.9–10.3)
Chloride: 107 mmol/L (ref 101–111)
Creatinine, Ser: 2.24 mg/dL — ABNORMAL HIGH (ref 0.61–1.24)
GFR calc Af Amer: 31 mL/min — ABNORMAL LOW (ref >60)
GFR calc non Af Amer: 27 mL/min — ABNORMAL LOW (ref >60)
Glucose, Bld: 91 mg/dL (ref 65–99)
Potassium: 4.5 mmol/L (ref 3.5–5.1)
Sodium: 140 mmol/L (ref 135–145)

## 2017-03-20 LAB — CBC
HCT: 39.8 % (ref 39.0–52.0)
Hemoglobin: 12.7 g/dL — ABNORMAL LOW (ref 13.0–17.0)
MCH: 29.9 pg (ref 26.0–34.0)
MCHC: 31.9 g/dL (ref 30.0–36.0)
MCV: 93.6 fL (ref 78.0–100.0)
Platelets: 195 10*3/uL (ref 150–400)
RBC: 4.25 MIL/uL (ref 4.22–5.81)
RDW: 13.7 % (ref 11.5–15.5)
WBC: 5.7 10*3/uL (ref 4.0–10.5)

## 2017-03-20 LAB — POCT I-STAT 4, (NA,K, GLUC, HGB,HCT)
Glucose, Bld: 89 mg/dL (ref 65–99)
HCT: 35 % — ABNORMAL LOW (ref 39.0–52.0)
Hemoglobin: 11.9 g/dL — ABNORMAL LOW (ref 13.0–17.0)
Potassium: 4.8 mmol/L (ref 3.5–5.1)
Sodium: 143 mmol/L (ref 135–145)

## 2017-03-20 SURGERY — EXCISION, MASS, HEAD
Anesthesia: General

## 2017-03-20 MED ORDER — PROPOFOL 10 MG/ML IV BOLUS
INTRAVENOUS | Status: AC
  Filled 2017-03-20: qty 20

## 2017-03-20 MED ORDER — ONDANSETRON HCL 4 MG/2ML IJ SOLN: 4.0000 mg | Freq: Once | INTRAMUSCULAR | Status: DC | PRN

## 2017-03-20 MED ORDER — FENTANYL CITRATE (PF) 100 MCG/2ML IJ SOLN: 25.0000 ug | INTRAMUSCULAR | Status: DC | PRN

## 2017-03-20 MED ORDER — ONDANSETRON HCL 4 MG/2ML IJ SOLN
INTRAMUSCULAR | Status: AC
  Filled 2017-03-20: qty 2

## 2017-03-20 MED ORDER — FENTANYL CITRATE (PF) 100 MCG/2ML IJ SOLN
INTRAMUSCULAR | Status: DC | PRN
  Administered 2017-03-20: 50 ug via INTRAVENOUS

## 2017-03-20 MED ORDER — FENTANYL CITRATE (PF) 250 MCG/5ML IJ SOLN
INTRAMUSCULAR | Status: AC
  Filled 2017-03-20: qty 5

## 2017-03-20 MED ORDER — OXYCODONE HCL 5 MG PO TABS: 5.0000 mg | ORAL_TABLET | Freq: Once | ORAL | Status: DC | PRN

## 2017-03-20 MED ORDER — LACTATED RINGERS IV SOLN
INTRAVENOUS | Status: DC | PRN
  Administered 2017-03-20: 07:00:00 via INTRAVENOUS

## 2017-03-20 MED ORDER — DEXAMETHASONE SODIUM PHOSPHATE 10 MG/ML IJ SOLN
INTRAMUSCULAR | Status: AC
  Filled 2017-03-20: qty 1

## 2017-03-20 MED ORDER — PROPOFOL 10 MG/ML IV BOLUS
INTRAVENOUS | Status: DC | PRN
  Administered 2017-03-20: 100 mg via INTRAVENOUS

## 2017-03-20 MED ORDER — TRAMADOL HCL 50 MG PO TABS: 50.0000 mg | ORAL_TABLET | Freq: Two times a day (BID) | ORAL | 0 refills | Status: DC | PRN

## 2017-03-20 MED ORDER — BUPIVACAINE HCL (PF) 0.5 % IJ SOLN
INTRAMUSCULAR | Status: AC
  Filled 2017-03-20: qty 30

## 2017-03-20 MED ORDER — DEXAMETHASONE SODIUM PHOSPHATE 10 MG/ML IJ SOLN
INTRAMUSCULAR | Status: DC | PRN
  Administered 2017-03-20: 10 mg via INTRAVENOUS

## 2017-03-20 MED ORDER — EPHEDRINE SULFATE 50 MG/ML IJ SOLN
INTRAMUSCULAR | Status: DC | PRN
  Administered 2017-03-20 (×2): 10 mg via INTRAVENOUS
  Administered 2017-03-20: 5 mg via INTRAVENOUS

## 2017-03-20 MED ORDER — BACITRACIN 500 UNIT/GM EX OINT
TOPICAL_OINTMENT | CUTANEOUS | Status: DC | PRN
  Administered 2017-03-20: 1 via TOPICAL

## 2017-03-20 MED ORDER — BUPIVACAINE HCL (PF) 0.5 % IJ SOLN
INTRAMUSCULAR | Status: DC | PRN
  Administered 2017-03-20: 6 mL

## 2017-03-20 MED ORDER — CIPROFLOXACIN IN D5W 400 MG/200ML IV SOLN
400.0000 mg | INTRAVENOUS | Status: AC
  Administered 2017-03-20: 400 mg via INTRAVENOUS
  Filled 2017-03-20: qty 200

## 2017-03-20 MED ORDER — BACITRACIN ZINC 500 UNIT/GM EX OINT
TOPICAL_OINTMENT | CUTANEOUS | Status: AC
  Filled 2017-03-20: qty 28.35

## 2017-03-20 MED ORDER — 0.9 % SODIUM CHLORIDE (POUR BTL) OPTIME
TOPICAL | Status: DC | PRN
  Administered 2017-03-20: 1000 mL

## 2017-03-20 MED ORDER — PHENYLEPHRINE 40 MCG/ML (10ML) SYRINGE FOR IV PUSH (FOR BLOOD PRESSURE SUPPORT)
PREFILLED_SYRINGE | INTRAVENOUS | Status: AC
  Filled 2017-03-20: qty 10

## 2017-03-20 MED ORDER — LIDOCAINE 2% (20 MG/ML) 5 ML SYRINGE
INTRAMUSCULAR | Status: AC
  Filled 2017-03-20: qty 5

## 2017-03-20 MED ORDER — OXYCODONE HCL 5 MG/5ML PO SOLN: 5.0000 mg | Freq: Once | ORAL | Status: DC | PRN

## 2017-03-20 MED ORDER — ONDANSETRON HCL 4 MG/2ML IJ SOLN
INTRAMUSCULAR | Status: DC | PRN
  Administered 2017-03-20: 4 mg via INTRAVENOUS

## 2017-03-20 MED ORDER — LIDOCAINE HCL (CARDIAC) 20 MG/ML IV SOLN
INTRAVENOUS | Status: DC | PRN
  Administered 2017-03-20: 20 mg via INTRAVENOUS

## 2017-03-20 MED ORDER — PHENYLEPHRINE HCL 10 MG/ML IJ SOLN
INTRAMUSCULAR | Status: DC | PRN
  Administered 2017-03-20 (×2): 120 ug via INTRAVENOUS
  Administered 2017-03-20 (×2): 80 ug via INTRAVENOUS

## 2017-03-20 MED ORDER — SUCCINYLCHOLINE CHLORIDE 20 MG/ML IJ SOLN
INTRAMUSCULAR | Status: AC
  Filled 2017-03-20: qty 1

## 2017-03-20 MED ORDER — EPHEDRINE 5 MG/ML INJ
INTRAVENOUS | Status: AC
  Filled 2017-03-20: qty 10

## 2017-03-20 MED ORDER — GLYCOPYRROLATE 0.2 MG/ML IJ SOLN
INTRAMUSCULAR | Status: DC | PRN
  Administered 2017-03-20: 0.2 mg via INTRAVENOUS

## 2017-03-20 SURGICAL SUPPLY — 41 items
BANDAGE ACE 4X5 VEL STRL LF (GAUZE/BANDAGES/DRESSINGS) IMPLANT
BLADE 10 SAFETY STRL DISP (BLADE) ×2 IMPLANT
CANISTER SUCT 3000ML PPV (MISCELLANEOUS) ×2 IMPLANT
CONT SPEC 4OZ CLIKSEAL STRL BL (MISCELLANEOUS) ×4 IMPLANT
COVER SURGICAL LIGHT HANDLE (MISCELLANEOUS) ×2 IMPLANT
DRAIN CHANNEL 10M FLAT 3/4 FLT (DRAIN) IMPLANT
DRAPE ORTHO SPLIT 77X108 STRL (DRAPES) ×1
DRAPE SURG ORHT 6 SPLT 77X108 (DRAPES) ×1 IMPLANT
DRSG EMULSION OIL 3X3 NADH (GAUZE/BANDAGES/DRESSINGS) IMPLANT
DRSG MEPILEX BORDER 4X4 (GAUZE/BANDAGES/DRESSINGS) ×2 IMPLANT
ELECT CAUTERY BLADE 6.4 (BLADE) ×2 IMPLANT
ELECT COATED BLADE 2.86 ST (ELECTRODE) ×2 IMPLANT
ELECT NEEDLE TIP 2.8 STRL (NEEDLE) ×2 IMPLANT
ELECT REM PT RETURN 9FT ADLT (ELECTROSURGICAL) ×2
ELECTRODE REM PT RTRN 9FT ADLT (ELECTROSURGICAL) ×1 IMPLANT
EVACUATOR SILICONE 100CC (DRAIN) IMPLANT
GAUZE SPONGE 4X4 12PLY STRL (GAUZE/BANDAGES/DRESSINGS) IMPLANT
GEL ULTRASOUND 20GR AQUASONIC (MISCELLANEOUS) IMPLANT
GLOVE BIO SURGEON STRL SZ 6 (GLOVE) ×2 IMPLANT
GOWN STRL REUS W/ TWL LRG LVL3 (GOWN DISPOSABLE) ×1 IMPLANT
GOWN STRL REUS W/ TWL XL LVL3 (GOWN DISPOSABLE) ×1 IMPLANT
GOWN STRL REUS W/TWL LRG LVL3 (GOWN DISPOSABLE) ×1
GOWN STRL REUS W/TWL XL LVL3 (GOWN DISPOSABLE) ×1
KIT BASIN OR (CUSTOM PROCEDURE TRAY) ×2 IMPLANT
KIT ROOM TURNOVER OR (KITS) ×2 IMPLANT
NEEDLE HYPO 25GX1X1/2 BEV (NEEDLE) ×2 IMPLANT
NS IRRIG 1000ML POUR BTL (IV SOLUTION) ×2 IMPLANT
PACK GENERAL/GYN (CUSTOM PROCEDURE TRAY) ×2 IMPLANT
PAD ARMBOARD 7.5X6 YLW CONV (MISCELLANEOUS) ×2 IMPLANT
SPONGE SURGIFOAM ABS GEL SZ50 (HEMOSTASIS) ×2 IMPLANT
STAPLER VISISTAT 35W (STAPLE) ×2 IMPLANT
SUT CHROMIC 4 0 PS 2 18 (SUTURE) ×2 IMPLANT
SUT PLAIN 5 0 P 3 18 (SUTURE) IMPLANT
SUT PROLENE 2 0 SH 30 (SUTURE) ×2 IMPLANT
SUT SILK 4 0 P 3 (SUTURE) IMPLANT
SUT VIC AB 4-0 P-3 18X BRD (SUTURE) IMPLANT
SUT VIC AB 4-0 P3 18 (SUTURE)
SUT VICRYL 4-0 PS2 18IN ABS (SUTURE) IMPLANT
SYR CONTROL 10ML LL (SYRINGE) ×2 IMPLANT
TOWEL OR 17X24 6PK STRL BLUE (TOWEL DISPOSABLE) ×2 IMPLANT
TOWEL OR 17X26 10 PK STRL BLUE (TOWEL DISPOSABLE) ×2 IMPLANT

## 2017-03-20 NOTE — Anesthesia Postprocedure Evaluation (Signed)
Anesthesia Post Note  Patient: Keith [name]  Procedure(s) Performed: EXCISION scalp lesion (N/A )     Patient location during evaluation: PACU Anesthesia Type: General Level of consciousness: awake and alert Pain management: pain level controlled Vital Signs Assessment: post-procedure vital signs reviewed and stable Respiratory status: spontaneous breathing, nonlabored ventilation, respiratory function stable and patient connected to nasal cannula oxygen Cardiovascular status: blood pressure returned to baseline and stable Postop Assessment: no apparent nausea or vomiting Anesthetic complications: no    Last Vitals:  Vitals:   03/20/17 0924 03/20/17 0950  BP: (!) 131/54 (!) 124/58  Pulse: 86 84  Resp: 11 16  Temp: 36.6 C   SpO2: 99% 100%    Last Pain:  Vitals:   03/20/17 0602  TempSrc: Oral                 Lleyton Byers COKER

## 2017-03-20 NOTE — Interval H&P Note (Signed)
History and Physical Interval Note:  03/20/2017 7:00 AM  Keith [name]  has presented today for surgery, with the diagnosis of scalp neoplasum  The various methods of treatment have been discussed with the patient and family. After consideration of risks, benefits and other options for treatment, the patient has consented to incisional biopsy scalp mass as a surgical intervention .  The patient's history has been reviewed, patient examined, no change in status, stable for surgery.  I have reviewed the patient's chart and labs.  Questions were answered to the patient's satisfaction.     Michelina Mexicano

## 2017-03-20 NOTE — Op Note (Signed)
Operative Note   DATE OF OPERATION: 3.5.19  LOCATION: [facility]  SURGICAL DIVISION: Plastic Surgery  PREOPERATIVE DIAGNOSES:  1. Scalp mass  POSTOPERATIVE DIAGNOSES:  same  PROCEDURE:  Incisional biopsy scalp mass  SURGEON: Irene Limbo MD MBA  ASSISTANT: none  ANESTHESIA:  General.   EBL: 10 ml  COMPLICATIONS: None immediate.   INDICATIONS FOR PROCEDURE:  The patient, Keith [name], is a 77 y.o. male born on [birth year], is here for biopsy of scalp mass that has been present for over a year with no prior biopsy. MRI/CT scan with solid appearing mass not invading calvarium. Plan incisional biopsy for tissue diagnosis.   FINDINGS: 5 x 5 cm fixed mass scalp, grey spongy material noted upon incision.  DESCRIPTION OF PROCEDURE:  The patient's operative site was marked with the patient in the preoperative area. The patient was taken to the operating room. SCDs were placed and IV antibiotics were given. The patient's operative site was prepped and draped in a sterile fashion. A time out was performed and all information was confirmed to be correct.  Local anesthetic infiltrated surrounding mass. Sharp wedge excision central portion mass completed. Specimen divided and sent both fresh and in formalin. Hemostasis obtained with cautery and gel foam placed in wound. Interrupted 4-0 and 2-0 prolene placed in mass to approximate edges. Antibiotic ointment and foam dressing applied.  The patient was allowed to wake from anesthesia, extubated and taken to the recovery room in satisfactory condition.   SPECIMENS: incisional biopsy scalp mass, half specimen sent fresh, half specimen sent in formalin  DRAINS: none  Irene Limbo, MD [facility]astic [facility] Re[facility] Surgery [phone], pin [phone]

## 2017-03-20 NOTE — Anesthesia Procedure Notes (Signed)
Procedure Name: LMA Insertion Date/Time: 03/20/2017 7:46 AM Performed by: Gaylene Brooks, CRNA Pre-anesthesia Checklist: Patient identified, Emergency Drugs available, Suction available and Patient being monitored Patient Re-evaluated:Patient Re-evaluated prior to induction Oxygen Delivery Method: Circle System Utilized Preoxygenation: Pre-oxygenation with 100% oxygen Induction Type: IV induction Ventilation: Mask ventilation without difficulty LMA: LMA inserted LMA Size: 4.0 Number of attempts: 1 Placement Confirmation: positive ETCO2 and breath sounds checked- equal and bilateral Tube secured with: Tape Dental Injury: Teeth and Oropharynx as per pre-operative assessment

## 2017-03-20 NOTE — Transfer of Care (Signed)
Immediate Anesthesia Transfer of Care Note  Patient: Keith [name]  Procedure(s) Performed: EXCISION scalp lesion (N/A )  Patient Location: PACU  Anesthesia Type:General  Level of Consciousness: awake, alert  and oriented  Airway & Oxygen Therapy: Patient Spontanous Breathing and Patient connected to nasal cannula oxygen  Post-op Assessment: Report given to RN and Post -op Vital signs reviewed and stable  Post vital signs: Reviewed and stable  Last Vitals:  Vitals:   03/20/17 0602  BP: (!) 146/49  Pulse: (!) 55  Resp: 18  Temp: 36.6 C  SpO2: 100%    Last Pain:  Vitals:   03/20/17 0602  TempSrc: Oral      Patients Stated Pain Goal: 2 (75/88/32 5498)  Complications: No apparent anesthesia complications

## 2017-03-20 NOTE — Discharge Instructions (Signed)

## 2017-03-20 NOTE — Anesthesia Preprocedure Evaluation (Signed)
Anesthesia Evaluation  Patient identified by MRN, date of birth, ID band Patient awake    Reviewed: Allergy & Precautions, NPO status , Patient's Chart, lab work & pertinent test results  Airway Mallampati: II  TM Distance: >3 FB Neck ROM: Full    Dental  (+) Edentulous Upper, Edentulous Lower   Pulmonary    breath sounds clear to auscultation       Cardiovascular  Rhythm:Regular Rate:Normal     Neuro/Psych    GI/Hepatic   Endo/Other    Renal/GU      Musculoskeletal   Abdominal   Peds  Hematology   Anesthesia Other Findings   Reproductive/Obstetrics                             Anesthesia Physical Anesthesia Plan  ASA: III  Anesthesia Plan: General   Post-op Pain Management:    Induction: Intravenous  PONV Risk Score and Plan: Ondansetron and Dexamethasone  Airway Management Planned: Oral ETT  Additional Equipment:   Intra-op Plan:   Post-operative Plan: Extubation in OR  Informed Consent: I have reviewed the patients History and Physical, chart, labs and discussed the procedure including the risks, benefits and alternatives for the proposed anesthesia with the patient or authorized representative who has indicated his/her understanding and acceptance.     Plan Discussed with: CRNA and Anesthesiologist  Anesthesia Plan Comments:         Anesthesia Quick Evaluation

## 2017-03-21 ENCOUNTER — Encounter (HOSPITAL_COMMUNITY): Payer: Self-pay | Admitting: Plastic Surgery

## 2017-03-26 DIAGNOSIS — C434 Malignant melanoma of scalp and neck: Secondary | ICD-10-CM | POA: Diagnosis present

## 2017-03-28 ENCOUNTER — Other Ambulatory Visit: Payer: Self-pay | Admitting: *Deleted

## 2017-03-28 MED ORDER — ATORVASTATIN CALCIUM 40 MG PO TABS: 40.0000 mg | ORAL_TABLET | Freq: Every day | ORAL | 1 refills | Status: DC

## 2017-03-29 ENCOUNTER — Other Ambulatory Visit (HOSPITAL_COMMUNITY): Payer: Self-pay | Admitting: Otolaryngology

## 2017-03-29 DIAGNOSIS — C434 Malignant melanoma of scalp and neck: Secondary | ICD-10-CM | POA: Diagnosis not present

## 2017-04-04 ENCOUNTER — Encounter (HOSPITAL_COMMUNITY): Payer: Medicare Other

## 2017-04-04 ENCOUNTER — Telehealth: Payer: Self-pay | Admitting: Cardiology

## 2017-04-04 NOTE — Telephone Encounter (Signed)
Spoke w/ pt and requested that he send a manual transmission b/c his home monitor has not updated in at least 14 days.   

## 2017-04-06 ENCOUNTER — Encounter (HOSPITAL_COMMUNITY)
Admission: RE | Admit: 2017-04-06 | Discharge: 2017-04-06 | Disposition: A | Discharge status: Home / Self Care | Payer: Medicare Other | Source: Ambulatory Visit | Attending: Otolaryngology | Admitting: Otolaryngology

## 2017-04-06 ENCOUNTER — Encounter (HOSPITAL_COMMUNITY): Payer: Self-pay

## 2017-04-06 DIAGNOSIS — C434 Malignant melanoma of scalp and neck: Secondary | ICD-10-CM | POA: Insufficient documentation

## 2017-04-12 ENCOUNTER — Encounter (HOSPITAL_COMMUNITY)
Admission: RE | Admit: 2017-04-12 | Discharge: 2017-04-12 | Disposition: A | Discharge status: Home / Self Care | Payer: Medicare Other | Source: Ambulatory Visit | Attending: Otolaryngology | Admitting: Otolaryngology

## 2017-04-12 DIAGNOSIS — C434 Malignant melanoma of scalp and neck: Secondary | ICD-10-CM | POA: Diagnosis not present

## 2017-04-12 MED ORDER — TECHNETIUM TC 99M SULFUR COLLOID FILTERED
0.5000 | Freq: Once | INTRAVENOUS | Status: AC | PRN
  Administered 2017-04-12: 0.5 via INTRADERMAL

## 2017-04-17 ENCOUNTER — Telehealth: Payer: Self-pay | Admitting: Cardiology

## 2017-04-17 NOTE — Telephone Encounter (Signed)
Attempted to call pt b/c his home monitor has not updated in at least 14 days. No answer and unable to leave a message.  

## 2017-04-17 NOTE — H&P (Signed)
Subjective:     Patient ID: Keith [name] is a 77 y.o. male.  HPI  3 weeks post incisional biopsy scalp mass. Pathology with at minimum 9 mm nodular melanoma, no LVI, MI 2/mm2.  Patient was admitted to [facility] 11/4/2018with acute CVA. He underwent cerebral angiogram and has found to have an occluded Lt ICA prox with partial distal reconstitution from the ipsilateral OA. He was noted on admission to have scalp mass that had been present for years over 2 years. Son notes trauma to scalp 4-5 years ago had mass that waxed and waned, then for last 2 years mass that has had continuous growth. Dr. Jones concerned about in office biopsy due to vascular appearance. He is on aspirin alone.  Worked as security guard, currently stopped working. Accompanied by son and his family. Son does have medical POA. No prior skin cancers, Denies similar masses.      Objective:   Physical Exam  Constitutional: He is oriented to person, place, and time.  Cardiovascular: Normal rate, regular rhythm and normal heart sounds.   Pulmonary/Chest: Effort normal and breath sounds normal.  Neurological: He is alert and oriented to person, place, and time.   HEENT: 4 x 4.5 cm non mobile firm mass, blue color with telangiectasia LYMPH:No cervical or occipital or supraclavicular adenopathy    Assessment:     Melanoma scalp    Plan:     Patient has met with Dr. Rosen. Plan wide excision, SLN sampling. With regards to coverage discussed options Integra vs burring bone and STSG. Last imaging does not show bone involvement but will need to excise periosteum. Discussed benefit of Integra may include ensuring margins. Plan wide excision with Integra placement. Patient had significant nausea with biopsy- plan at least overnight stay in hospital.  Brinda Thimmappa, MD MBA Plastic & Reconstructive Surgery [phone], pin [phone]      

## 2017-04-18 NOTE — H&P (Signed)
HPI:   Keith [name] is a 77 y.o. male who presents as a consult Patient.   Referring Provider: Self, A Referral  Chief complaint: Scalp melanoma.  HPI: History of a stroke back in November. At that time he was found to have a scalp lesion. He was referred to Dr. Ronnald [name], dermatology. He was then referred to Dr. Iran [name] for biopsy. Biopsy revealed melanoma. Plan wide excision is being considered. Referred to me for sentinel node biopsy. When he had the stroke he lost function on the right side and lost his ability to speak. He recovered completely from that. He has not smoked or drank in many years. He had a history of trauma to the scalp about 4 or 5 years ago.  PMH/Meds/All/SocHx/FamHx/ROS:   Past Medical History:  Diagnosis Date  . High cholesterol  . History of stroke  . Hypothyroid  . Stroke Keith [name])   Past Surgical History:  Procedure Laterality Date  . CEREBRAL ANGIOGRAM 11/2016  . STENT PLACEMENT   No family history of bleeding disorders, wound healing problems or difficulty with anesthesia.   Social History   Social History  . Marital status: Significant Other  Spouse name: N/A  . Number of children: N/A  . Years of education: N/A   Occupational History  . Not on file.   Social History Main Topics  . Smoking status: Former Research scientist (life sciences)  . Smokeless tobacco: Never Used  . Alcohol use Not on file  . Drug use: Unknown  . Sexual activity: Not on file   Other Topics Concern  . Not on file   Social History Narrative  . No narrative on file   Current Outpatient Prescriptions:  . aspirin 325 MG EC tablet *ANTIPLATELET*, Take 325 mg by mouth., Disp: , Rfl:  . atorvastatin (LIPITOR) 40 MG tablet, Take 40 mg by mouth., Disp: , Rfl:  . TRAVATAN Z 0.004 % ophthalmic solution, INSTILL 1 DROP INTO BOTH EYES AT BEDTIME, Disp: , Rfl: 3  A complete ROS was performed with pertinent positives/negatives noted in the HPI. The remainder of the ROS are negative.   Physical Exam:    There were no vitals taken for this visit.  General: Healthy and alert, thin elderly gentleman, in no distress, breathing easily. Normal affect. In a pleasant mood. Head: Normocephalic, atraumatic. Large ulcerative mass involving the vertex/frontal scalp. Eyes: Pupils are equal, and reactive to light. Vision is grossly intact. No spontaneous or gaze nystagmus. Ears: Ear canals are clear. Tympanic membranes are intact, with normal landmarks and the middle ears are clear and healthy. Hearing: Grossly normal. Nose: Severe nasal septal deviation with bilateral obstruction but no mucosal disease. Face: No masses or scars, facial nerve function is symmetric. Oral Cavity: No mucosal abnormalities are noted. Tongue with normal mobility. Dentures in place. Oropharynx: Tonsils are symmetric. There are no mucosal masses identified. Tongue base appears normal and healthy. Larynx/Hypopharynx: deferred Chest: Deferred Neck: No palpable masses, no cervical adenopathy, no thyroid nodules or enlargement. Neuro: Cranial nerves II-XII with normal function. Balance: Normal gate. Other findings: none.  Independent Review of Additional Tests or Records:  none  Procedures:  none  Impression & Plans:  Scalp melanoma. Plan is for wide resection sometime in the near future. I will send him for a lymphoscintigram, and then we will discussed the findings again. The possible drainage basins for this lesion include bilateral parotids, bilateral jugular chains, bilateral occipital nodes. Results of the imaging will help with preoperative counseling and plan for surgery. We  should be able to plan this is a combined procedure.

## 2017-04-19 ENCOUNTER — Encounter (HOSPITAL_COMMUNITY): Payer: Self-pay

## 2017-04-19 NOTE — Pre-Procedure Instructions (Signed)
Keith [name]  04/19/2017      [id number] [address], [place] [address] Phone: [phone] Fax: [phone]    Your procedure is scheduled on Monday 04/23/2017.  Report to [facility] Admitting at 0700 A.M.  Call this number if you have problems the morning of surgery:  [phone]   Remember:  Do not eat food or drink liquids after midnight.  Take these medicines the morning of surgery with A SIP OF WATER: Tramadol (Ultram) - if needed for pain   Do not wear jewelry  Do not wear lotions, powders, or colognes, or deodorant.  Men may shave face and neck.  Do not bring valuables to the hospital.  [facility] is not responsible for any belongings or valuables.  Hearing aids, eyeglasses, contacts, dentures or bridgework may not be worn into surgery.  Leave your suitcase in the car.  After surgery it may be brought to your room.  For patients admitted to the hospital, discharge time will be determined by your treatment team.  Patients discharged the day of surgery will not be allowed to drive home.   Name and phone number of your driver:    Special instructions:   [facility]- Preparing For Surgery  Before surgery, you can play an important role. Because skin is not sterile, your skin needs to be as free of germs as possible. You can reduce the number of germs on your skin by washing with CHG (chlorahexidine gluconate) Soap before surgery.  CHG is an antiseptic cleaner which kills germs and bonds with the skin to continue killing germs even after washing.  Please do not use if you have an allergy to CHG or antibacterial soaps. If your skin becomes reddened/irritated stop using the CHG.  Do not shave (including legs and underarms) for at least 48 hours prior to first CHG shower. It is OK to shave your face.  Please follow these instructions carefully.   1. Shower the  NIGHT BEFORE SURGERY and the MORNING OF SURGERY with CHG.   2. If you chose to wash your hair, wash your hair first as usual with your normal shampoo.  3. After you shampoo, rinse your hair and body thoroughly to remove the shampoo.  4. Use CHG as you would any other liquid soap. You can apply CHG directly to the skin and wash gently with a scrungie or a clean washcloth.   5. Apply the CHG Soap to your body ONLY FROM THE NECK DOWN.  Do not use on open wounds or open sores. Avoid contact with your eyes, ears, mouth and genitals (private parts). Wash Face and genitals (private parts)  with your normal soap.  6. Wash thoroughly, paying special attention to the area where your surgery will be performed.  7. Thoroughly rinse your body with warm water from the neck down.  8. DO NOT shower/wash with your normal soap after using and rinsing off the CHG Soap.  9. Pat yourself dry with a CLEAN TOWEL.  10. Wear CLEAN PAJAMAS to bed the night before surgery, wear comfortable clothes the morning of surgery  11. Place CLEAN SHEETS on your bed the night of your first shower and DO NOT SLEEP WITH PETS.    Day of Surgery: Shower as stated above. Do not apply any deodorants/lotions.  Please wear clean clothes to the hospital/surgery center.      Please  read over the following fact sheets that you were given.

## 2017-04-20 ENCOUNTER — Encounter (HOSPITAL_COMMUNITY): Payer: Self-pay | Admitting: *Deleted

## 2017-04-20 ENCOUNTER — Inpatient Hospital Stay (HOSPITAL_COMMUNITY)
Admission: RE | Admit: 2017-04-20 | Discharge: 2017-04-20 | Disposition: A | Discharge status: Home / Self Care | Payer: Self-pay | Source: Ambulatory Visit

## 2017-04-20 ENCOUNTER — Other Ambulatory Visit: Payer: Self-pay

## 2017-04-20 ENCOUNTER — Other Ambulatory Visit (HOSPITAL_COMMUNITY): Payer: Self-pay | Admitting: Otolaryngology

## 2017-04-20 ENCOUNTER — Encounter (HOSPITAL_COMMUNITY): Payer: Self-pay

## 2017-04-20 DIAGNOSIS — C434 Malignant melanoma of scalp and neck: Secondary | ICD-10-CM

## 2017-04-20 HISTORY — DX: Nausea with vomiting, unspecified: R11.2

## 2017-04-20 HISTORY — DX: Other specified postprocedural states: Z98.890

## 2017-04-20 HISTORY — DX: Other complications of anesthesia, initial encounter: T88.59XA

## 2017-04-20 HISTORY — DX: Unspecified glaucoma: H40.9

## 2017-04-20 HISTORY — DX: Gastro-esophageal reflux disease without esophagitis: K21.9

## 2017-04-20 HISTORY — DX: Adverse effect of unspecified anesthetic, initial encounter: T41.45XA

## 2017-04-20 HISTORY — DX: Personal history of other diseases of the digestive system: Z87.19

## 2017-04-20 HISTORY — DX: Unspecified cataract: H26.9

## 2017-04-20 NOTE — Progress Notes (Signed)
Anesthesia Chart Review:  Pt is a same day work up; he was unable to attend his pre-admission testing appointment.   Pt is a 77 year old male scheduled for B parotidectomy, neck sentinel node biopsy with frozen section, excision of malignant lesion on scalp, application of a cell on 04/23/2017 with [name], MD and Irene Limbo, MD   - No PCP. Was referred to [facility] after stroke but has not been seen yet.  - Neurologist is Rosalin Hawking, MD  PMH includes:  Stroke (acute left MCA territory infarct 11/2016), loop recorder (inserted 11/23/16 after stroke), CKD, glaucoma, melanoma, hypothyroidism, post-op N/V, GERD. Former smoker. BMI 18  - Hospitalized 65/7-90/3/83 for embolic stroke.   Medications include: ASA 325 mg, Lipitor  Labs will be obtained day of surgery  EKG 03/20/17: Sinus bradycardia (58 bpm). RBBB. LAFB.   Carotid, innominate, vertebral artery angiograms 11/23/16:  - Angiographically occluded left internal carotid artery at the bulb without evidence of an angiographic string sign. - Distal reconstitution of the distal cavernous and the petrous left ICA via the ophthalmic artery from the nasolacrimal branches of the left external carotid artery. - Antegrade flow noted into the supraclinoid left ICA and subsequently minimally the left middle cerebral artery distribution. - Approximately 45% stenosis of the right internal carotid artery proximally associated with a small ulcerated plaque. - Intracranial mild atherosclerotic changes involving both internal carotid arteries.  Echo 11/21/16:  - Left ventricle: The cavity size was normal. Wall thickness wasnormal. Systolic function was normal. The estimated ejectionfraction was in the range of 55% to 60%. Wall motion was normal;there were no regional wall motion abnormalities. Features areconsistent with a pseudonormal left ventricular filling pattern,with concomitant abnormal relaxation and  increased filling pressure (grade 2 diastolic dysfunction). - Aortic valve: There was no stenosis. There was mildregurgitation. - Mitral valve: There was trivial regurgitation. - Right ventricle: The cavity size was normal. Systolic functionwas normal. - Pulmonary arteries: No complete TR doppler jet so unable toestimate PA systolic pressure. - Inferior vena cava: The vessel was normal in size. Therespirophasic diameter changes were in the normal range (>= 50%),consistent with normal central venous pressure. - Impressions: Normal LV size with EF 55-60%. Moderate diastolic dysfunction.Normal RV size and systolic function. Mild aortic insufficiency.  Carotid duplex 11/20/16:  - Right Carotid: There is evidence in the right ICA of a 60-79% stenosis. - Left Carotid: The left ICA appears occluded. - Vertebrals: Both vertebral arteries were patent with antegrade flow.  If labs acceptable day of surgery, I anticipate pt can proceed with surgery as scheduled.   Willeen Cass, FNP-BC [facility]/Anesthesiology Phone: [phone] 04/20/2017 11:21 AM

## 2017-04-20 NOTE — Progress Notes (Signed)
Keith [name] did not make it to PAT appointment - "car broke down on th way".  I left a voice message for Dr Janeice Robinson nurse informing her that patient did not have a PAT visit and labs wiould be done Monday, pre-op.

## 2017-04-20 NOTE — Pre-Procedure Instructions (Signed)
Keith [name]  04/20/2017      Your procedure is scheduled on Monday 04/23/2017.  Report to [facility] Admitting at 0700 A.M.                Your surgery or procedure is scheduled for 9:00 AM   Call this number if you have problems the morning of surgery:  [phone]- pre- op desk   Remember:  Do not eat food or drink liquids after midnight Sunday, April 7.  Take these medicines the morning of surgery with A SIP OF WATER: Tramadol (Ultram) - if needed for pain   Do not wear jewelry  Do not wear lotions, powders, or colognes, or deodorant.  Men may shave face and neck.  Do not bring valuables to the hospital.  [facility] is not responsible for any belongings or valuables.  Hearing aids, eyeglasses, contacts, dentures or bridgework may not be worn into surgery.  Leave your suitcase in the car.  After surgery it may be brought to your room.  For patients admitted to the hospital, discharge time will be determined by your treatment team.  Patients discharged the day of surgery will not be allowed to drive home.   Name and phone number of your driver:    Special instructions:   [facility]- Preparing For Surgery  Before surgery, you can play an important role. Because skin is not sterile, your skin needs to be as free of germs as possible. You can reduce the number of germs on your skin by washing with CHG (chlorahexidine gluconate) Soap before surgery.  CHG is an antiseptic cleaner which kills germs and bonds with the skin to continue killing germs even after washing.  Please do not use if you have an allergy to CHG or antibacterial soaps. If your skin becomes reddened/irritated stop using the CHG.  Do not shave (including legs and underarms) for at least 48 hours prior to first CHG shower. It is OK to shave your face.  Please follow these instructions carefully.   1. Shower the NIGHT BEFORE SURGERY and the MORNING OF SURGERY with CHG.   2. If you chose to wash  your hair, wash your hair first as usual with your normal shampoo.  3. After you shampoo, rinse your hair and body thoroughly to remove the shampoo.               Wash Face and genitals (private parts)  with your normal soap, then rinse.  4. Use CHG as you would any other liquid soap. You can apply CHG directly to the skin and wash gently with a scrungie or a clean washcloth.   5. Apply the CHG Soap to your body ONLY FROM THE NECK DOWN.  Do not use on open wounds or open sores. Avoid contact with your eyes, ears, mouth and genitals (private parts).  6. Wash thoroughly, paying special attention to the area where your surgery will be performed.  7. Thoroughly rinse your body with warm water from the neck down.  8. DO NOT shower/wash with your normal soap after using and rinsing off the CHG Soap.  9. Pat yourself dry with a CLEAN TOWEL.  10. Wear CLEAN PAJAMAS to bed the night before surgery, wear comfortable clothes the morning of surgery  11. Place CLEAN SHEETS on your bed the night of your first shower and DO NOT SLEEP WITH PETS.  Day of Surgery: Shower as stated above. Do not apply any  deodorants/lotions.  Please wear clean clothes to the hospital/surgery center.    Patients discharged the day of surgery will not be allowed to drive home.    Do not wear jewelry  Do not wear lotions, powders, or colognes, or deodorant.  Men may shave face and neck.  Do not bring valuables to the hospital.  [facility] is not responsible for any belongings or valuables.  Hearing aids, eyeglasses, contacts, dentures or bridgework may not be worn into surgery.  Leave your suitcase in the car.  After surgery it may be brought to your room.   Please read over the following fact sheets that you were given.

## 2017-04-23 ENCOUNTER — Ambulatory Visit (HOSPITAL_COMMUNITY)
Admission: RE | Admit: 2017-04-23 | Discharge: 2017-04-23 | Disposition: A | Discharge status: Home / Self Care | Payer: Medicare Other | Source: Ambulatory Visit | Attending: Otolaryngology | Admitting: Otolaryngology

## 2017-04-23 ENCOUNTER — Encounter (HOSPITAL_COMMUNITY): Payer: Self-pay | Admitting: Certified Registered"

## 2017-04-23 ENCOUNTER — Inpatient Hospital Stay (HOSPITAL_COMMUNITY): Payer: Medicare Other | Admitting: Emergency Medicine

## 2017-04-23 ENCOUNTER — Inpatient Hospital Stay (HOSPITAL_COMMUNITY)
Admission: RE | Admit: 2017-04-23 | Discharge: 2017-04-25 | DRG: 578 | Disposition: A | Discharge status: Home / Self Care | Payer: Medicare Other | Source: Ambulatory Visit | Attending: Otolaryngology | Admitting: Otolaryngology

## 2017-04-23 ENCOUNTER — Encounter (HOSPITAL_COMMUNITY)
Admission: RE | Disposition: A | Discharge status: Home / Self Care | Payer: Self-pay | Source: Ambulatory Visit | Attending: Otolaryngology

## 2017-04-23 DIAGNOSIS — E039 Hypothyroidism, unspecified: Secondary | ICD-10-CM | POA: Diagnosis not present

## 2017-04-23 DIAGNOSIS — C434 Malignant melanoma of scalp and neck: Secondary | ICD-10-CM | POA: Diagnosis not present

## 2017-04-23 DIAGNOSIS — Z87891 Personal history of nicotine dependence: Secondary | ICD-10-CM | POA: Diagnosis not present

## 2017-04-23 DIAGNOSIS — Z7982 Long term (current) use of aspirin: Secondary | ICD-10-CM

## 2017-04-23 DIAGNOSIS — R221 Localized swelling, mass and lump, neck: Secondary | ICD-10-CM | POA: Diagnosis not present

## 2017-04-23 DIAGNOSIS — I6529 Occlusion and stenosis of unspecified carotid artery: Secondary | ICD-10-CM | POA: Diagnosis not present

## 2017-04-23 DIAGNOSIS — E7800 Pure hypercholesterolemia, unspecified: Secondary | ICD-10-CM | POA: Diagnosis present

## 2017-04-23 DIAGNOSIS — K219 Gastro-esophageal reflux disease without esophagitis: Secondary | ICD-10-CM | POA: Diagnosis present

## 2017-04-23 DIAGNOSIS — Z8673 Personal history of transient ischemic attack (TIA), and cerebral infarction without residual deficits: Secondary | ICD-10-CM | POA: Diagnosis not present

## 2017-04-23 DIAGNOSIS — I639 Cerebral infarction, unspecified: Secondary | ICD-10-CM | POA: Diagnosis not present

## 2017-04-23 HISTORY — PX: PAROTIDECTOMY: SHX2163

## 2017-04-23 HISTORY — PX: APPLICATION OF A-CELL OF HEAD/NECK: SHX6304

## 2017-04-23 HISTORY — PX: MELANOMA EXCISION: SHX5266

## 2017-04-23 HISTORY — PX: MASS EXCISION: SHX2000

## 2017-04-23 HISTORY — DX: Malignant melanoma of scalp and neck: C43.4

## 2017-04-23 LAB — BASIC METABOLIC PANEL
Anion gap: 9 (ref 5–15)
BUN: 22 mg/dL — ABNORMAL HIGH (ref 6–20)
CO2: 23 mmol/L (ref 22–32)
Calcium: 8.9 mg/dL (ref 8.9–10.3)
Chloride: 107 mmol/L (ref 101–111)
Creatinine, Ser: 2.39 mg/dL — ABNORMAL HIGH (ref 0.61–1.24)
GFR calc Af Amer: 29 mL/min — ABNORMAL LOW (ref >60)
GFR calc non Af Amer: 25 mL/min — ABNORMAL LOW (ref >60)
Glucose, Bld: 94 mg/dL (ref 65–99)
Potassium: 4.2 mmol/L (ref 3.5–5.1)
Sodium: 139 mmol/L (ref 135–145)

## 2017-04-23 LAB — CBC
HCT: 38.9 % — ABNORMAL LOW (ref 39.0–52.0)
Hemoglobin: 12.6 g/dL — ABNORMAL LOW (ref 13.0–17.0)
MCH: 30.4 pg (ref 26.0–34.0)
MCHC: 32.4 g/dL (ref 30.0–36.0)
MCV: 94 fL (ref 78.0–100.0)
Platelets: 171 10*3/uL (ref 150–400)
RBC: 4.14 MIL/uL — ABNORMAL LOW (ref 4.22–5.81)
RDW: 13.3 % (ref 11.5–15.5)
WBC: 4.7 10*3/uL (ref 4.0–10.5)

## 2017-04-23 LAB — TYPE AND SCREEN
ABO/RH(D): O POS
Antibody Screen: NEGATIVE

## 2017-04-23 SURGERY — EXCISION, PAROTID GLAND
Anesthesia: General | Site: Neck

## 2017-04-23 MED ORDER — BACITRACIN ZINC 500 UNIT/GM EX OINT
TOPICAL_OINTMENT | CUTANEOUS | Status: AC
  Filled 2017-04-23: qty 28.35

## 2017-04-23 MED ORDER — SUGAMMADEX SODIUM 200 MG/2ML IV SOLN
INTRAVENOUS | Status: DC | PRN
  Administered 2017-04-23: 200 mg via INTRAVENOUS

## 2017-04-23 MED ORDER — ASPIRIN EC 325 MG PO TBEC
325.0000 mg | DELAYED_RELEASE_TABLET | Freq: Every day | ORAL | Status: DC
  Administered 2017-04-23 – 2017-04-25 (×3): 325 mg via ORAL
  Filled 2017-04-23 (×3): qty 1

## 2017-04-23 MED ORDER — DEXAMETHASONE SODIUM PHOSPHATE 10 MG/ML IJ SOLN
INTRAMUSCULAR | Status: AC
  Filled 2017-04-23: qty 1

## 2017-04-23 MED ORDER — LACTATED RINGERS IV SOLN
INTRAVENOUS | Status: DC
  Administered 2017-04-23: 07:00:00 via INTRAVENOUS

## 2017-04-23 MED ORDER — ONDANSETRON HCL 4 MG/2ML IJ SOLN
INTRAMUSCULAR | Status: DC | PRN
  Administered 2017-04-23: 4 mg via INTRAVENOUS

## 2017-04-23 MED ORDER — DEXTROSE-NACL 5-0.9 % IV SOLN
INTRAVENOUS | Status: DC
  Administered 2017-04-23 – 2017-04-25 (×4): via INTRAVENOUS

## 2017-04-23 MED ORDER — FENTANYL CITRATE (PF) 100 MCG/2ML IJ SOLN
INTRAMUSCULAR | Status: DC | PRN
  Administered 2017-04-23 (×6): 50 ug via INTRAVENOUS

## 2017-04-23 MED ORDER — LIDOCAINE-EPINEPHRINE 1 %-1:100000 IJ SOLN
INTRAMUSCULAR | Status: AC
  Filled 2017-04-23: qty 1

## 2017-04-23 MED ORDER — ROCURONIUM BROMIDE 100 MG/10ML IV SOLN
INTRAVENOUS | Status: DC | PRN
  Administered 2017-04-23: 30 mg via INTRAVENOUS

## 2017-04-23 MED ORDER — ROCURONIUM BROMIDE 10 MG/ML (PF) SYRINGE
PREFILLED_SYRINGE | INTRAVENOUS | Status: AC
  Filled 2017-04-23: qty 5

## 2017-04-23 MED ORDER — ONDANSETRON HCL 4 MG/2ML IJ SOLN: 4.0000 mg | Freq: Once | INTRAMUSCULAR | Status: DC | PRN

## 2017-04-23 MED ORDER — BUPIVACAINE-EPINEPHRINE (PF) 0.25% -1:200000 IJ SOLN
INTRAMUSCULAR | Status: AC
  Filled 2017-04-23: qty 30

## 2017-04-23 MED ORDER — TECHNETIUM TC 99M SULFUR COLLOID FILTERED
1.0000 | Freq: Once | INTRAVENOUS | Status: AC | PRN
  Administered 2017-04-23: 1 via INTRADERMAL

## 2017-04-23 MED ORDER — PROMETHAZINE HCL 25 MG PO TABS: 25.0000 mg | ORAL_TABLET | Freq: Four times a day (QID) | ORAL | Status: DC | PRN

## 2017-04-23 MED ORDER — FENTANYL CITRATE (PF) 100 MCG/2ML IJ SOLN: 25.0000 ug | INTRAMUSCULAR | Status: DC | PRN

## 2017-04-23 MED ORDER — PROPOFOL 1000 MG/100ML IV EMUL
INTRAVENOUS | Status: AC
  Filled 2017-04-23: qty 100

## 2017-04-23 MED ORDER — TRAMADOL HCL 50 MG PO TABS: 50.0000 mg | ORAL_TABLET | Freq: Two times a day (BID) | ORAL | Status: DC | PRN

## 2017-04-23 MED ORDER — CIPROFLOXACIN IN D5W 400 MG/200ML IV SOLN
400.0000 mg | INTRAVENOUS | Status: AC
  Administered 2017-04-23: 400 mg via INTRAVENOUS
  Filled 2017-04-23: qty 200

## 2017-04-23 MED ORDER — PROPOFOL 10 MG/ML IV BOLUS
INTRAVENOUS | Status: AC
  Filled 2017-04-23: qty 20

## 2017-04-23 MED ORDER — 0.9 % SODIUM CHLORIDE (POUR BTL) OPTIME
TOPICAL | Status: DC | PRN
  Administered 2017-04-23 (×2): 1000 mL

## 2017-04-23 MED ORDER — SUGAMMADEX SODIUM 200 MG/2ML IV SOLN
INTRAVENOUS | Status: AC
  Filled 2017-04-23: qty 2

## 2017-04-23 MED ORDER — MIDAZOLAM HCL 5 MG/5ML IJ SOLN
INTRAMUSCULAR | Status: DC | PRN
  Administered 2017-04-23: 0.5 mg via INTRAVENOUS

## 2017-04-23 MED ORDER — ONDANSETRON HCL 4 MG/2ML IJ SOLN
INTRAMUSCULAR | Status: AC
  Filled 2017-04-23: qty 2

## 2017-04-23 MED ORDER — MIDAZOLAM HCL 2 MG/2ML IJ SOLN
INTRAMUSCULAR | Status: AC
  Filled 2017-04-23: qty 2

## 2017-04-23 MED ORDER — BUPIVACAINE-EPINEPHRINE 0.25% -1:200000 IJ SOLN
INTRAMUSCULAR | Status: DC | PRN
  Administered 2017-04-23: 7.5 mL

## 2017-04-23 MED ORDER — ARTIFICIAL TEARS OPHTHALMIC OINT
TOPICAL_OINTMENT | OPHTHALMIC | Status: DC | PRN
  Administered 2017-04-23: 1 via OPHTHALMIC

## 2017-04-23 MED ORDER — PHENYLEPHRINE 40 MCG/ML (10ML) SYRINGE FOR IV PUSH (FOR BLOOD PRESSURE SUPPORT)
PREFILLED_SYRINGE | INTRAVENOUS | Status: DC | PRN
  Administered 2017-04-23 (×6): 40 ug via INTRAVENOUS
  Administered 2017-04-23: 80 ug via INTRAVENOUS
  Administered 2017-04-23: 40 ug via INTRAVENOUS

## 2017-04-23 MED ORDER — SODIUM CHLORIDE 0.9 % IJ SOLN
INTRAMUSCULAR | Status: AC
  Filled 2017-04-23: qty 20

## 2017-04-23 MED ORDER — EPHEDRINE 5 MG/ML INJ
INTRAVENOUS | Status: AC
  Filled 2017-04-23: qty 10

## 2017-04-23 MED ORDER — HYDROCODONE-ACETAMINOPHEN 5-325 MG PO TABS
1.0000 | ORAL_TABLET | ORAL | Status: DC | PRN
  Administered 2017-04-23: 1 via ORAL
  Administered 2017-04-24: 2 via ORAL
  Filled 2017-04-23: qty 2
  Filled 2017-04-23: qty 1

## 2017-04-23 MED ORDER — FENTANYL CITRATE (PF) 250 MCG/5ML IJ SOLN
INTRAMUSCULAR | Status: AC
  Filled 2017-04-23: qty 5

## 2017-04-23 MED ORDER — ARTIFICIAL TEARS OPHTHALMIC OINT
TOPICAL_OINTMENT | OPHTHALMIC | Status: AC
  Filled 2017-04-23: qty 3.5

## 2017-04-23 MED ORDER — LIDOCAINE 2% (20 MG/ML) 5 ML SYRINGE
INTRAMUSCULAR | Status: AC
  Filled 2017-04-23: qty 5

## 2017-04-23 MED ORDER — SODIUM CHLORIDE 0.9 % IV SOLN
INTRAVENOUS | Status: DC | PRN
  Administered 2017-04-23: 09:00:00 via INTRAVENOUS

## 2017-04-23 MED ORDER — DEXAMETHASONE SODIUM PHOSPHATE 4 MG/ML IJ SOLN
INTRAMUSCULAR | Status: DC | PRN
  Administered 2017-04-23: 8 mg via INTRAVENOUS

## 2017-04-23 MED ORDER — PHENYLEPHRINE 40 MCG/ML (10ML) SYRINGE FOR IV PUSH (FOR BLOOD PRESSURE SUPPORT)
PREFILLED_SYRINGE | INTRAVENOUS | Status: AC
  Filled 2017-04-23: qty 10

## 2017-04-23 MED ORDER — LACTATED RINGERS IV SOLN
INTRAVENOUS | Status: DC | PRN
  Administered 2017-04-23 (×2): via INTRAVENOUS

## 2017-04-23 MED ORDER — PHENYLEPHRINE HCL 10 MG/ML IJ SOLN
INTRAVENOUS | Status: DC | PRN
  Administered 2017-04-23: 60 ug/min via INTRAVENOUS

## 2017-04-23 MED ORDER — PROMETHAZINE HCL 25 MG RE SUPP
25.0000 mg | Freq: Four times a day (QID) | RECTAL | Status: DC | PRN
  Filled 2017-04-23: qty 1

## 2017-04-23 MED ORDER — LIDOCAINE-EPINEPHRINE 1 %-1:100000 IJ SOLN
INTRAMUSCULAR | Status: DC | PRN
  Administered 2017-04-23: 30 mL

## 2017-04-23 MED ORDER — EPHEDRINE SULFATE-NACL 50-0.9 MG/10ML-% IV SOSY
PREFILLED_SYRINGE | INTRAVENOUS | Status: DC | PRN
  Administered 2017-04-23: 2.5 mg via INTRAVENOUS
  Administered 2017-04-23: 5 mg via INTRAVENOUS
  Administered 2017-04-23: 2.5 mg via INTRAVENOUS
  Administered 2017-04-23: 5 mg via INTRAVENOUS
  Administered 2017-04-23: 10 mg via INTRAVENOUS

## 2017-04-23 MED ORDER — ATORVASTATIN CALCIUM 40 MG PO TABS
40.0000 mg | ORAL_TABLET | Freq: Every day | ORAL | Status: DC
  Administered 2017-04-23 – 2017-04-24 (×2): 40 mg via ORAL
  Filled 2017-04-23 (×2): qty 1

## 2017-04-23 MED ORDER — PROPOFOL 10 MG/ML IV BOLUS
INTRAVENOUS | Status: DC | PRN
  Administered 2017-04-23: 150 mg via INTRAVENOUS
  Administered 2017-04-23: 30 mg via INTRAVENOUS

## 2017-04-23 SURGICAL SUPPLY — 83 items
ATTRACTOMAT 16X20 MAGNETIC DRP (DRAPES) ×1 IMPLANT
BLADE 10 SAFETY STRL DISP (BLADE) ×2 IMPLANT
BLADE CLIPPER SURG (BLADE) ×1 IMPLANT
BLADE SURG 15 STRL LF DISP TIS (BLADE) ×2 IMPLANT
BLADE SURG 15 STRL SS (BLADE) ×1
CANISTER SUCT 3000ML PPV (MISCELLANEOUS) ×6 IMPLANT
CLEANER TIP ELECTROSURG 2X2 (MISCELLANEOUS) ×3 IMPLANT
CONT SPEC 4OZ CLIKSEAL STRL BL (MISCELLANEOUS) IMPLANT
CORDS BIPOLAR (ELECTRODE) ×3 IMPLANT
COVER SURGICAL LIGHT HANDLE (MISCELLANEOUS) ×6 IMPLANT
DERMABOND ADVANCED (GAUZE/BANDAGES/DRESSINGS) ×1
DERMABOND ADVANCED .7 DNX12 (GAUZE/BANDAGES/DRESSINGS) ×2 IMPLANT
DRAIN CHANNEL 10M FLAT 3/4 FLT (DRAIN) ×2 IMPLANT
DRAIN HEMOVAC 7FR (DRAIN) ×1 IMPLANT
DRAIN SNY 10 ROU (WOUND CARE) ×1 IMPLANT
DRAIN WOUND SNY 15 RND (WOUND CARE) IMPLANT
DRAPE HALF SHEET 40X57 (DRAPES) IMPLANT
DRAPE INCISE 23X17 IOBAN STRL (DRAPES) ×1
DRAPE INCISE 23X17 STRL (DRAPES) ×2 IMPLANT
DRAPE INCISE IOBAN 23X17 STRL (DRAPES) ×2 IMPLANT
DRAPE ORTHO SPLIT 77X108 STRL (DRAPES) ×1
DRAPE SURG ORHT 6 SPLT 77X108 (DRAPES) ×2 IMPLANT
DRESSING MATRIX WOUND 4X5 (Tissue) IMPLANT
DRSG ADAPTIC 3X8 NADH LF (GAUZE/BANDAGES/DRESSINGS) ×1 IMPLANT
DRSG EMULSION OIL 3X3 NADH (GAUZE/BANDAGES/DRESSINGS) ×3 IMPLANT
DRSG MATRIX WOUND 4X5 (Tissue) ×3 IMPLANT
DRSG VAC ATS SM SENSATRAC (GAUZE/BANDAGES/DRESSINGS) ×1 IMPLANT
ELECT CAUTERY BLADE 6.4 (BLADE) ×3 IMPLANT
ELECT COATED BLADE 2.86 ST (ELECTRODE) ×4 IMPLANT
ELECT NDL TIP 2.8 STRL (NEEDLE) ×2 IMPLANT
ELECT NEEDLE TIP 2.8 STRL (NEEDLE) IMPLANT
ELECT REM PT RETURN 9FT ADLT (ELECTROSURGICAL) ×3
ELECTRODE REM PT RTRN 9FT ADLT (ELECTROSURGICAL) ×4 IMPLANT
EVACUATOR SILICONE 100CC (DRAIN) ×3 IMPLANT
FORCEPS BIPOLAR SPETZLER 8 1.0 (NEUROSURGERY SUPPLIES) ×3 IMPLANT
GAUZE SPONGE 4X4 12PLY STRL (GAUZE/BANDAGES/DRESSINGS) IMPLANT
GAUZE SPONGE 4X4 16PLY XRAY LF (GAUZE/BANDAGES/DRESSINGS) ×5 IMPLANT
GEL ULTRASOUND 20GR AQUASONIC (MISCELLANEOUS) ×2 IMPLANT
GLOVE BIO SURGEON STRL SZ 6 (GLOVE) ×3 IMPLANT
GLOVE BIOGEL PI IND STRL 8.5 (GLOVE) IMPLANT
GLOVE BIOGEL PI INDICATOR 8.5 (GLOVE) ×1
GLOVE ECLIPSE 6.5 STRL STRAW (GLOVE) ×1 IMPLANT
GLOVE ECLIPSE 7.5 STRL STRAW (GLOVE) ×3 IMPLANT
GLOVE ECLIPSE 8.0 STRL XLNG CF (GLOVE) ×1 IMPLANT
GOWN STRL REUS W/ TWL LRG LVL3 (GOWN DISPOSABLE) ×8 IMPLANT
GOWN STRL REUS W/ TWL XL LVL3 (GOWN DISPOSABLE) IMPLANT
GOWN STRL REUS W/TWL LRG LVL3 (GOWN DISPOSABLE) ×2
GOWN STRL REUS W/TWL XL LVL3 (GOWN DISPOSABLE) ×1
KIT BASIN OR (CUSTOM PROCEDURE TRAY) ×6 IMPLANT
KIT TURNOVER KIT B (KITS) ×5 IMPLANT
LOCATOR NERVE 3 VOLT (DISPOSABLE) IMPLANT
MARKER SKIN DUAL TIP RULER LAB (MISCELLANEOUS) ×1 IMPLANT
NDL HYPO 25GX1X1/2 BEV (NEEDLE) ×2 IMPLANT
NDL PRECISIONGLIDE 27X1.5 (NEEDLE) ×2 IMPLANT
NEEDLE HYPO 25GX1X1/2 BEV (NEEDLE) ×3 IMPLANT
NEEDLE PRECISIONGLIDE 27X1.5 (NEEDLE) IMPLANT
NS IRRIG 1000ML POUR BTL (IV SOLUTION) ×6 IMPLANT
PACK GENERAL/GYN (CUSTOM PROCEDURE TRAY) ×3 IMPLANT
PAD ABD 8X10 STRL (GAUZE/BANDAGES/DRESSINGS) ×1 IMPLANT
PAD ARMBOARD 7.5X6 YLW CONV (MISCELLANEOUS) ×8 IMPLANT
PENCIL FOOT CONTROL (ELECTRODE) ×3 IMPLANT
SHEARS HARMONIC 9CM CVD (BLADE) ×3 IMPLANT
STAPLER VISISTAT 35W (STAPLE) ×5 IMPLANT
SUCTION FRAZIER HANDLE 10FR (MISCELLANEOUS) ×1
SUCTION TUBE FRAZIER 10FR DISP (MISCELLANEOUS) IMPLANT
SUT CHROMIC 3 0 SH 27 (SUTURE) ×2 IMPLANT
SUT CHROMIC 4 0 PS 2 18 (SUTURE) ×5 IMPLANT
SUT ETHILON 2 0 FS 18 (SUTURE) ×1 IMPLANT
SUT ETHILON 3 0 FSL (SUTURE) ×2 IMPLANT
SUT ETHILON 5 0 P 3 18 (SUTURE)
SUT NYLON ETHILON 5-0 P-3 1X18 (SUTURE) IMPLANT
SUT PLAIN 5 0 P 3 18 (SUTURE) IMPLANT
SUT SILK 2 0 PERMA HAND 18 BK (SUTURE) ×1 IMPLANT
SUT SILK 2 0 SH CR/8 (SUTURE) ×2 IMPLANT
SUT SILK 4 0 P 3 (SUTURE) IMPLANT
SUT SILK 4 0 REEL (SUTURE) ×3 IMPLANT
SUT VIC AB 4-0 P-3 18X BRD (SUTURE) IMPLANT
SUT VIC AB 4-0 P3 18 (SUTURE)
SUT VICRYL 4-0 PS2 18IN ABS (SUTURE) IMPLANT
SYR CONTROL 10ML LL (SYRINGE) ×3 IMPLANT
TOWEL OR 17X24 6PK STRL BLUE (TOWEL DISPOSABLE) ×2 IMPLANT
TRAY ENT MC OR (CUSTOM PROCEDURE TRAY) ×3 IMPLANT
TRAY FOLEY W/METER SILVER 14FR (SET/KITS/TRAYS/PACK) ×1 IMPLANT

## 2017-04-23 NOTE — Anesthesia Postprocedure Evaluation (Signed)
Anesthesia Post Note  Patient: Keith [name]  Procedure(s) Performed: BILATERAL SUPERFICIAL PAROTIDECTOMY/NECK SENTINEL NODE BIOPSY WITH FROZEN SECTION; LEFT SELECTIVE NECK DISSECTION (Bilateral Neck) EXCISION MALIGNANT LESION OF SCALP (N/A Head) APPLICATION OF INTREGRA (N/A Head)     Patient location during evaluation: PACU Anesthesia Type: General Level of consciousness: awake Pain management: pain level controlled Vital Signs Assessment: post-procedure vital signs reviewed and stable Respiratory status: spontaneous breathing, nonlabored ventilation, respiratory function stable and patient connected to nasal cannula oxygen Cardiovascular status: blood pressure returned to baseline and stable Postop Assessment: no apparent nausea or vomiting Anesthetic complications: no    Last Vitals:  Vitals:   04/23/17 1421 04/23/17 1450  BP: (!) 137/48   Pulse: 88   Resp: (!) 21   Temp:  (!) 36.3 C  SpO2: 97%     Last Pain:  Vitals:   04/23/17 1450  TempSrc:   PainSc: 0-No pain                 Kyna Blahnik P Alden Feagan

## 2017-04-23 NOTE — Op Note (Signed)
OPERATIVE REPORT  DATE OF SURGERY: 04/23/2017  PATIENT:  Keith [name],  77 y.o. male  PRE-OPERATIVE DIAGNOSIS:  Parotid neck mass  POST-OPERATIVE DIAGNOSIS:  Parotid neck mass  PROCEDURE:  Procedure(s): BILATERAL SUPERFICIAL PAROTIDECTOMY/NECK SENTINEL NODE BIOPSY WITH FROZEN SECTION; LEFT SELECTIVE NECK DISSECTION EXCISION MALIGNANT LESION OF SCALP APPLICATION OF INTREGRA  SURGEON:  Beckie Salts, MD  ASSISTANTS: None  ANESTHESIA:   General   EBL: 75 ml  DRAINS: 7 [place] round JP right side; 10 [place] round JP left side.  LOCAL MEDICATIONS USED:  None  SPECIMEN: Right superficial parotidectomy, left superficial parotidectomy, left inferior parotid sentinel node, negative for melanoma, left selective neck dissection encompassing levels 1 and 2.  COUNTS:  Correct  PROCEDURE DETAILS: The patient was taken to the operating room and placed on the operating table in the supine position. Following induction of general endotracheal anesthesia, the scalp portion of the procedure was performed by plastic surgery.  The head neck area was re-prepped and draped in the typical fashion.  1.  Right superficial parotidectomy.  A standard right parotidectomy incision was outlined with marking pen and incised with electrocautery.  Skin flap was elevated anteriorly exposing the parotid fascia.  Using the neoprobe attempts were made to isolate individual lymph nodes within the parotid this was unsuccessful and decision was then made to simply perform a superficial parotidectomy with nerve dissection.  The gland was dissected off of the ear canal and the upper sternocleidomastoid muscle and brought forward.  The digastric muscle was identified.  The main trunk of the facial nerve was then identified just superior to the digastric.  This was traced out towards the pes.  Upper and lower divisions were then dissected in all of the associated branches.  A McCabe dissector was used for the dissection and  the harmonic scalpel was used to divide the parotid tissue.  Throughout the procedure the neoprobe was used additionally to try to isolate lymph nodes and again were unsuccessful.  There is minimal signal in the deep lobe.  The superficial lobe was sent for pathologic evaluation.  The 7 [place] round drain was exited through separate stab incision secured in place with nylon suture and the bulb was attached and charged.  A subcuticular running closure of 3-0 chromic was accomplished and Dermabond was used on the skin.  2.  Left superficial parotidectomy.  The exact same procedure was performed on the left side.  Again I was unable to isolate individual sentinel nodes with significant activity beyond background noise.  The facial nerve was identified and preserved as described on the other side.  All branches were dissected and left lateral lobe was removed.  After the lateral lobe was removed there is additional slight signal in the very left breast inferior aspect of the gland.  This area was removed and sent for frozen section analysis which was negative for any metastatic cancer.  There was ex vivo signal in this last specimen.  3.  Left selective neck dissection.  Left level 2 and 3 was dissected actually moving fibrofatty tissue and associated lymph nodes.  The spinal accessory nerve was preserved.  The lymph node bearing tissue posterior superior to the spinal accessory nerve was dissected down to the splenius capitis and levator scapular muscles.  These were then passed under the nerve.  Dissection continued anteriorly bringing the fibrofatty tissue off of the cervical plexus and forward towards the internal jugular vein.  The vein was dissected free of fibrofatty tissue  as well.  Dissection continued down to the level of the laryngeal cartilage.  There are multiple benign-appearing lymph nodes obtained through the specimen and these were all sent for pathologic evaluation.  Carotid and vagus were Intact.   Spinal accessory and hypoglossal nerves were intact as well.  There was irrigated with saline.  Hemostasis was confirmed.  A 10 French drain was exited through separate stab incision inferiorly secured in place with nylon suture and attached to the bulb.  Running subcuticular closure was used along the upper part of the incision and interrupted platysma layer closure inferiorly.  Skin staples were used inferiorly and Dermabond superiorly.  The drains were charged.  Patient was awakened extubated and transferred to recovery in stable condition.    PATIENT DISPOSITION:  To PACU, stable

## 2017-04-23 NOTE — Progress Notes (Signed)
   ENT Progress Note:  s/p Procedure(s): BILATERAL SUPERFICIAL PAROTIDECTOMY/NECK SENTINEL NODE BIOPSY WITH FROZEN SECTION; LEFT SELECTIVE NECK DISSECTION EXCISION MALIGNANT LESION OF SCALP APPLICATION OF INTREGRA   Subjective: Min discomfort  Objective: Vital signs in last 24 hours: Temp:  [97.3 F (36.3 C)-99.3 F (37.4 C)] 99.3 F (37.4 C) (04/08 1556) Pulse Rate:  [55-100] 80 (04/08 1556) Resp:  [8-21] 21 (04/08 1421) BP: (135-149)/(35-65) 135/65 (04/08 1556) SpO2:  [97 %-100 %] 99 % (04/08 1556) Arterial Line BP: (127-162)/(35-64) 127/64 (04/08 1406) Weight:  [52.6 kg (116 lb)-54.6 kg (120 lb 5.9 oz)] 54.6 kg (120 lb 5.9 oz) (04/08 1556) Weight change:     Intake/Output from previous day: No intake/output data recorded. Intake/Output this shift: Total I/O In: 1500 [I.V.:1500] Out: 485 [Urine:335; Drains:25; Blood:125]  Labs: Recent Labs    04/23/17 0704  WBC 4.7  HGB 12.6*  HCT 38.9*  PLT 171   Recent Labs    04/23/17 0704  NA 139  K 4.2  CL 107  CO2 23  GLUCOSE 94  BUN 22*  CALCIUM 8.9    Studies/Results: Nm Sentinel Node Inj-no Rpt (melanoma)  Result Date: 04/23/2017 Sulfur colloid was injected by the nuclear medicine technologist for melanoma sentinel node.     PHYSICAL EXAM: Inc intact No swelling, JP's intact and functional -  Min output   Assessment/Plan: Stable postop Cont current care    Zella Dewan 04/23/2017, 4:50 PM

## 2017-04-23 NOTE — Anesthesia Procedure Notes (Signed)
Arterial Line Insertion Start/End4/08/2017 8:48 AM, 04/23/2017 8:52 AM Performed by: Wilburn Cornelia, CRNA, CRNA  Patient location: Pre-op. Preanesthetic checklist: patient identified, IV checked, site marked, risks and benefits discussed, surgical consent, monitors and equipment checked, pre-op evaluation and timeout performed Left, radial was placed Catheter size: 20 G Hand hygiene performed  and maximum sterile barriers used   Attempts: 1 Procedure performed without using ultrasound guided technique. Following insertion, dressing applied and Biopatch. Post procedure assessment: normal  Patient tolerated the procedure well with no immediate complications.

## 2017-04-23 NOTE — Interval H&P Note (Signed)
History and Physical Interval Note:  04/23/2017 6:45 AM  Keith [name]  has presented today for surgery, with the diagnosis of Parotid neck mass  The various methods of treatment have been discussed with the patient and family. After consideration of risks, benefits and other options for treatment, the patient has consented to  Excision malignant lesion scalp, application Integra as a surgical intervention .  The patient's history has been reviewed, patient examined, no change in status, stable for surgery.  I have reviewed the patient's chart and labs.  Questions were answered to the patient's satisfaction.     [name]

## 2017-04-23 NOTE — Transfer of Care (Signed)
Immediate Anesthesia Transfer of Care Note  Patient: Keith [name]  Procedure(s) Performed: BILATERAL SUPERFICIAL PAROTIDECTOMY/NECK SENTINEL NODE BIOPSY WITH FROZEN SECTION; LEFT SELECTIVE NECK DISSECTION (Bilateral Neck) EXCISION MALIGNANT LESION OF SCALP (N/A Head) APPLICATION OF INTREGRA (N/A Head)  Patient Location: PACU  Anesthesia Type:General  Level of Consciousness: awake and patient cooperative  Airway & Oxygen Therapy: Patient Spontanous Breathing and Patient connected to face mask oxygen  Post-op Assessment: Report given to RN, Post -op Vital signs reviewed and stable and Patient moving all extremities X 4  Post vital signs: Reviewed and stable  Last Vitals:  Vitals Value Taken Time  BP 149/57 04/23/2017  1:36 PM  Temp    Pulse 103 04/23/2017  1:44 PM  Resp 14 04/23/2017  1:44 PM  SpO2 100 % 04/23/2017  1:44 PM  Vitals shown include unvalidated device data.  Last Pain:  Vitals:   04/23/17 0654  TempSrc: Oral      Patients Stated Pain Goal: 3 (84/13/24 4010)  Complications: No apparent anesthesia complications

## 2017-04-23 NOTE — Anesthesia Preprocedure Evaluation (Signed)
Anesthesia Evaluation  Patient identified by MRN, date of birth, ID band Patient awake    Reviewed: Allergy & Precautions, NPO status , Patient's Chart, lab work & pertinent test results  History of Anesthesia Complications (+) PONV and history of anesthetic complications  Airway Mallampati: III  TM Distance: >3 FB Neck ROM: Full    Dental  (+) Edentulous Lower, Edentulous Upper   Pulmonary former smoker,    Pulmonary exam normal breath sounds clear to auscultation       Cardiovascular negative cardio ROS Normal cardiovascular exam+ Valvular Problems/Murmurs AI  Rhythm:Regular Rate:Normal  ECG: SB, bifascicular block, rate 58  Implantable loop recorder   ECHO: Normal LV size with EF 55-60%. Moderate diastolic dysfunction. Normal RV size and systolic function. Mild aortic insufficiency.    Neuro/Psych Occluded Left ICA  Right ICA of a 60-79% stenosis. Approximately 45% stenosis of the right internal carotid artery proximally associated with a small ulcerated plaque. CVA negative psych ROS   GI/Hepatic Neg liver ROS, hiatal hernia, GERD  ,  Endo/Other  Hypothyroidism   Renal/GU Renal disease     Musculoskeletal negative musculoskeletal ROS (+)   Abdominal   Peds  Hematology  (+) anemia , HLD   Anesthesia Other Findings Parotid neck mass  Reproductive/Obstetrics                             Anesthesia Physical Anesthesia Plan  ASA: III  Anesthesia Plan: General   Post-op Pain Management:    Induction: Intravenous  PONV Risk Score and Plan: 3 and Ondansetron, Dexamethasone and Treatment may vary due to age or medical condition  Airway Management Planned: Oral ETT  Additional Equipment: Arterial line  Intra-op Plan:   Post-operative Plan: Extubation in OR  Informed Consent: I have reviewed the patients History and Physical, chart, labs and discussed the procedure  including the risks, benefits and alternatives for the proposed anesthesia with the patient or authorized representative who has indicated his/her understanding and acceptance.   Dental advisory given  Plan Discussed with: CRNA  Anesthesia Plan Comments:         Anesthesia Quick Evaluation

## 2017-04-23 NOTE — Op Note (Signed)
Operative Note   DATE OF OPERATION: 4.8.19  LOCATION: [place] Main OR-observation  SURGICAL DIVISION: Plastic Surgery  PREOPERATIVE DIAGNOSES:  1. Melanoma scalp  POSTOPERATIVE DIAGNOSES:  same  PROCEDURE:  1. Excision malignant lesion scalp 9 x 8 cm 2. Application Integra dermal substrate 75 cm2  SURGEON: Irene Limbo MD MBA  ASSISTANT: none  ANESTHESIA:  General.   EBL: 50 ml  COMPLICATIONS: None immediate.   INDICATIONS FOR PROCEDURE:  The patient, Keith [name], is a 77 y.o. male born on 1940-04-05, is here for excision biopsy proven melanoma scalp.   FINDINGS: 5 x 4 cm mass scalp with prior incisional biopsy and prolene sutures in this area with scabbing. Prior to incisional biopsy no history ulceration or bleeding.  DESCRIPTION OF PROCEDURE:  The patient's operative site was marked with the patient in the preoperative area. The patient was taken to the operating room. SCDs were placed and IV antibiotics were given. The patient's operative site was prepped and draped in a sterile fashion. A time out was performed and all information was confirmed to be correct. Local anesthetic infiltrated surrounding area of planned resection and to perform bilateral supraorbital nerve blocks. 2 cm margins marked surrounding mass. Sharp excision completed, this was carried through superficial fascia and included periosteum. Specimen marked for pathology. Dimensions of resection 8 x 8 cm. Hemostatsis obtained. Integra substrate appleid to wound. Bolster of adpatic and sterile sponge applied and secured with staples.   Drapes removed and case turned over to Dr. Constance Holster.  SPECIMENS: Melanoma scalp  DRAINS: none  Irene Limbo, MD [facility] Plastic & Reconstructive Surgery [phone], pin [phone]

## 2017-04-23 NOTE — Anesthesia Procedure Notes (Signed)
Procedure Name: Intubation Date/Time: 04/23/2017 9:12 AM Performed by: Orlie Dakin, CRNA Pre-anesthesia Checklist: Patient identified, Emergency Drugs available, Suction available, Patient being monitored and Timeout performed Patient Re-evaluated:Patient Re-evaluated prior to induction Oxygen Delivery Method: Circle system utilized Preoxygenation: Pre-oxygenation with 100% oxygen Induction Type: IV induction Ventilation: Mask ventilation without difficulty and Oral airway inserted - appropriate to patient size Laryngoscope Size: Sabra Heck and 3 Grade View: Grade I Tube type: Oral Tube size: 7.5 mm Number of attempts: 1 Airway Equipment and Method: Stylet Placement Confirmation: ETT inserted through vocal cords under direct vision,  positive ETCO2 and breath sounds checked- equal and bilateral Secured at: 23 cm Tube secured with: Tape Dental Injury: Teeth and Oropharynx as per pre-operative assessment

## 2017-04-24 ENCOUNTER — Other Ambulatory Visit: Payer: Self-pay

## 2017-04-24 ENCOUNTER — Encounter (HOSPITAL_COMMUNITY): Payer: Self-pay | Admitting: General Practice

## 2017-04-24 NOTE — Progress Notes (Signed)
Patient ID: Keith [name], male   DOB: 12-Nov-1940, 77 y.o.   MRN: [id number] Subjective: Complains of sore throat and some trouble swallowing which she has had for a long time but is a Gorin bit worse since the surgery.  Objective: Vital signs in last 24 hours: Temp:  [97.3 F (36.3 C)-99.3 F (37.4 C)] 97.5 F (36.4 C) (04/09 0520) Pulse Rate:  [59-100] 59 (04/09 0520) Resp:  [8-21] 16 (04/09 0520) BP: (121-149)/(48-77) 123/77 (04/09 0520) SpO2:  [97 %-100 %] 98 % (04/09 0520) Arterial Line BP: (127-162)/(35-64) 127/64 (04/08 1406) Weight:  [54.6 kg (120 lb 5.9 oz)] 54.6 kg (120 lb 5.9 oz) (04/08 1556) Weight change:  Last BM Date: 04/23/17  Intake/Output from previous day: 04/08 0701 - 04/09 0700 In: 2100 [I.V.:2100] Out: 980 [Urine:785; Drains:70; Blood:125] Intake/Output this shift: No intake/output data recorded.  PHYSICAL EXAM: Incisions look excellent.  Drains are holding a seal.  There is no swelling.  Facial nerve function normal on the left, diffuse weakness on the right.  Lab Results: Recent Labs    04/23/17 0704  WBC 4.7  HGB 12.6*  HCT 38.9*  PLT 171   BMET Recent Labs    04/23/17 0704  NA 139  K 4.2  CL 107  CO2 23  GLUCOSE 94  BUN 22*  CREATININE 2.39*  CALCIUM 8.9    Studies/Results: Nm Sentinel Node Inj-no Rpt (melanoma)  Result Date: 04/23/2017 Sulfur colloid was injected by the nuclear medicine technologist for melanoma sentinel node.    Medications: I have reviewed the patient's current medications.  Assessment/Plan: Stable postop.  Anticipate removal of drains tomorrow and discharge home.  Start ambulating today.  LOS: 1 day   [name] 04/24/2017, 8:59 AM

## 2017-04-24 NOTE — Progress Notes (Signed)
  Plastic Surgery  POD# 1 excision melanoma scalp application Integra  Temp:  [97.3 F (36.3 C)-99.3 F (37.4 C)] 97.5 F (36.4 C) (04/09 0520) Pulse Rate:  [59-100] 59 (04/09 0520) Resp:  [8-21] 16 (04/09 0520) BP: (121-149)/(48-77) 123/77 (04/09 0520) SpO2:  [97 %-100 %] 98 % (04/09 0520) Arterial Line BP: (127-162)/(35-64) 127/64 (04/08 1406) Weight:  [54.6 kg (120 lb 5.9 oz)] 54.6 kg (120 lb 5.9 oz) (04/08 1556)   Reports head hurt overnight, responded to oral medication OOB to BR No nausea Tolerated liquids- no dentures and unable to eat presently  PE Alert NAD Scalp sponge dry intact Scant drainage in drains Incisions intact no hematoma  A/P doing well. Changed diet to soft until dentures available. Plan keep sponge dressing dry intact through follow up visit with me arranged for next week. Home when cleared by Dr. Constance Holster. Path pending  Irene Limbo, MD [facility] Plastic & Reconstructive Surgery [phone], pin [phone]

## 2017-04-24 NOTE — Care Management Note (Signed)
Case Management Note  Patient Details  Name: Keith [name] MRN: [id number] Date of Birth: [birth year]  Subjective/Objective:                    Action/Plan:  Anticipate discharge to home tomorrow after drain removal. Continue to follow for discharge needs.  Expected Discharge Date:                  Expected Discharge Plan:  Home/Self Care  In-House Referral:  NA  Discharge planning Services  CM Consult  Post Acute Care Choice:  NA Choice offered to:  NA  DME Arranged:  N/A DME Agency:  NA  HH Arranged:    [place] Agency:     Status of Service:     If discussed at [place] of Stay Meetings, dates discussed:    Additional Comments:  Marilu Favre, RN 04/24/2017, 10:14 AM

## 2017-04-25 ENCOUNTER — Encounter (HOSPITAL_COMMUNITY): Payer: Self-pay | Admitting: Otolaryngology

## 2017-04-25 MED ORDER — ARTIFICIAL TEARS OPHTHALMIC OINT
TOPICAL_OINTMENT | Freq: Every day | OPHTHALMIC | Status: DC
  Filled 2017-04-25: qty 3.5

## 2017-04-25 NOTE — Progress Notes (Signed)
Patient ID: Keith [name], male   DOB: Mar 24, 1940, 77 y.o.   MRN: [id number] Subjective: Minimal surgical pain.  Having trouble swallowing food but can swallow liquids well.  This is not a new problem.  Also having some irritation of his right eye.  Objective: Vital signs in last 24 hours: Temp:  [97.5 F (36.4 C)-98.2 F (36.8 C)] 97.5 F (36.4 C) (04/10 0429) Pulse Rate:  [57-64] 62 (04/10 0429) Resp:  [17] 17 (04/09 2126) BP: (131-153)/(56-100) 153/56 (04/10 0429) SpO2:  [97 %-99 %] 97 % (04/10 0429) Weight change:  Last BM Date: 04/23/17  Intake/Output from previous day: 04/09 0701 - 04/10 0700 In: 660 [P.O.:60; I.V.:600] Out: 505 [Urine:475; Drains:30] Intake/Output this shift: No intake/output data recorded.  PHYSICAL EXAM: Neck looks excellent.  Incisions are clean and dry.  Both JPs were removed.  Facial nerve function normal on the left, complete weakness on the right.  He is able to close his right eye partially but not completely.  Lab Results: Recent Labs    04/23/17 0704  WBC 4.7  HGB 12.6*  HCT 38.9*  PLT 171   BMET Recent Labs    04/23/17 0704  NA 139  K 4.2  CL 107  CO2 23  GLUCOSE 94  BUN 22*  CREATININE 2.39*  CALCIUM 8.9    Studies/Results: No results found.  Medications: I have reviewed the patient's current medications.  Assessment/Plan: Stable postop.  Drains removed.  Go home today.  Instructions provided to tape his eye closed at night and to use lubricating drops during the day.  He can follow-up with his ophthalmologist as needed.  I will see him back next week or sooner if needed.  I will call him with results of the pathology when available.  LOS: 2 days   Keith [name] 04/25/2017, 11:07 AM

## 2017-04-25 NOTE — Progress Notes (Signed)
Patient discharged to home with instructions. 

## 2017-04-25 NOTE — Discharge Instructions (Signed)
Keep a dressing on both sides where the drain was if there is any drainage.  If there is no more drainage than it is not necessary to keep the dressing on.  Avoid using any ointments on the incisions.  Keep the right eye taped closed at night or anytime to sleep.  Use lubricating drops as often as needed.

## 2017-04-25 NOTE — Progress Notes (Signed)
  Plastic Surgery  POD# 2 excision melanoma scalp application Integra  Temp:  [97.5 F (36.4 C)-98.2 F (36.8 C)] 97.5 F (36.4 C) (04/10 0429) Pulse Rate:  [57-64] 62 (04/10 0429) Resp:  [17] 17 (04/09 2126) BP: (131-153)/(56-100) 153/56 (04/10 0429) SpO2:  [97 %-99 %] 97 % (04/10 0429)   PO 60 JP 15/15  Notes difficulty with right eye closing, c/o sore throat  PE Alert NAD Scalp sponge dry intact Scant drainage in drains Incisions intact no hematoma Some lagopthalmos on right  A/P  Plan keep sponge dressing dry intact through follow up visit with me arranged for next week- ok to shower body, sponge bathe face/head. Home when cleared by Dr. Constance Holster. Path pending Right eye lubrication Poor PO intake, otherwise stable  Irene Limbo, MD [facility] Plastic & Reconstructive Surgery [phone], pin [phone]

## 2017-04-25 NOTE — Discharge Summary (Signed)
  Physician Discharge Summary  Patient ID: Keith [name] MRN: [id number] DOB/AGE: May 21, 1940 77 y.o.  Admit date: 04/23/2017 Discharge date: 04/25/2017  Admission Diagnoses: Scalp melanoma  Discharge Diagnoses:  Active Problems:   Melanoma of scalp [facility])   Discharged Condition: good  Hospital Course: No complications  Consults: none  Significant Diagnostic Studies: none  Treatments: surgery: Wide resection scalp melanoma, bilateral parotidectomy and left selective neck dissection for sentinel node evaluation.  Discharge Exam: Blood pressure (!) 153/56, pulse 62, temperature (!) 97.5 F (36.4 C), temperature source Oral, resp. rate 17, height 5\' 9"  (1.753 m), weight 54.6 kg (120 lb 5.9 oz), SpO2 97 %. PHYSICAL EXAM: No problems postoperatively.  Having trouble swallowing which is something he has had for a long time.  Facial nerve weakness on the right, expected to recover over the next few weeks.  Otherwise no complications.  Disposition: Discharge disposition: 01-Home or Self Care       Discharge Instructions    Diet - low sodium heart healthy   Complete by:  As directed    Increase activity slowly   Complete by:  As directed       Follow-up Information    Irene Limbo, MD On 05/02/2017.   Specialty:  Plastic Surgery Why:  as scheduled Contact information: [place] [address] [phone]        [name], MD. Schedule an appointment as soon as possible for a visit in 1 week(s).   Specialty:  Otolaryngology Contact information: [address] [place] [address] [phone]           Signed: [name] 04/25/2017, 11:11 AM

## 2017-05-04 ENCOUNTER — Telehealth: Payer: Self-pay | Admitting: Oncology

## 2017-05-04 NOTE — Telephone Encounter (Signed)
Spoke with patient regarding appointment Date/Time/location/Phone number

## 2017-05-07 NOTE — Progress Notes (Signed)
[facility]  PATIENT: Keith [name] DOB: Jan 16, 1941   REASON FOR VISIT: follow up stroke  [place] and friend    HISTORY OF PRESENT ILLNESS: Mr.Keith A Littleis a 77 y.o.malenever sees doctors before admitted for right side weakness and slurry speech. CT head showed trace left frontal SAH. MRI left MCA infarct. MRA showed left ICA occlusion. CUS left ICA occlusion but also right ICA 60-79% stenosis. Repeat CT showed resolution of trace SAH. DSA further confirmed left ICA occlusion but right ICA only 45% stenosis. EF 55-60%. LE venous doppler negative. LDL 125 and A1C 5.5. Due to questionable SAH, he was only discharged on ASA 81mg  and lipitor 40 to CIR.   Interval History1/23/19DrErlinda Hong During the interval time, the patient has been doing well. Neuro deficit resolved with therapy. Not checking BP at home today in clinic 110/60. Has lost 20lb but stabilized. At home doing regular exercise with walking and sleeps well. Has not had called for dermatology appointment yet. Also complains of blurry vision would like to see an eye doctor. On STROKE AF trial, so far no afib. UPDATE 4/23/2019CM Mr. Hazelrigg, 77 year old male returns for follow-up with history of right-sided weakness and slurry speech MRI left MCA infarct.  Carotid ultrasound left ICA occlusion but right ICA 60-70% stenosis.  He is currently on aspirin 325 daily without recurrent stroke or TIA symptoms.  He has minimal bruising and no bleeding in addition he is on Lipitor without myalgias.  He is in the stroke atrial fib trial so far no atrial fibrillation.  He had a large melanoma removed from his scalp and also lymph nodes removed on the left.  He is to follow-up with oncology next month.  He has also seen ophthalmology, needs cataract surgery.  He returns for reevaluation  REVIEW OF SYSTEMS: Full 14 system review of systems performed and notable only for those listed, all others are neg:    Constitutional: Fatigue Cardiovascular: neg Ear/Nose/Throat: Facial swelling Skin: neg Eyes: Blurred vision, patient has cataracts and severe glaucoma Respiratory: neg Gastroitestinal: neg  Hematology/Lymphatic: neg  Endocrine: neg Musculoskeletal:neg Allergy/Immunology: neg Neurological: neg Psychiatric: neg Sleep : neg   ALLERGIES: Allergies  Allergen Reactions  . Penicillins Other (See Comments)    UNSPECIFIED REACTION  Has patient had a PCN reaction causing immediate rash, facial/tongue/throat swelling, SOB or lightheadedness with hypotension: Unknown Has patient had a PCN reaction causing severe rash involving mucus membranes or skin necrosis: Unknown Has patient had a PCN reaction that required hospitalization: Unknown Has patient had a PCN reaction occurring within the last 10 years: No If all of the above answers are "NO", then may proceed with Cephalosporin use.     HOME MEDICATIONS: Outpatient Medications Prior to Visit  Medication Sig Dispense Refill  . aspirin EC 325 MG tablet Take 1 tablet (325 mg total) by mouth daily. (Patient taking differently: Take 325 mg by mouth at bedtime. ) 30 tablet 0  . atorvastatin (LIPITOR) 40 MG tablet Take 1 tablet (40 mg total) by mouth daily at 6 PM. (Patient taking differently: Take 40 mg by mouth daily. ) 30 tablet 1  . traMADol (ULTRAM) 50 MG tablet Take 1 tablet (50 mg total) by mouth every 12 (twelve) hours as needed. (Patient not taking: Reported on 05/08/2017) 10 tablet 0   No facility-administered medications prior to visit.     PAST MEDICAL HISTORY: Past Medical History:  Diagnosis Date  . Cancer ([place])    Melanoma  . Cataract  planning surgery  . Chronic kidney disease   . Complication of anesthesia   . GERD (gastroesophageal reflux disease)    tums prn  . Glaucoma   . History of hiatal hernia   . Hypothyroidism   . Obstructive uropathy   . PONV (postoperative nausea and vomiting) 03/20/2017  . Stroke  [facility])    specch and right side at time of stroke 04/21/27- all function return to normal    PAST SURGICAL HISTORY: Past Surgical History:  Procedure Laterality Date  . APPLICATION OF A-CELL OF HEAD/NECK N/A 04/23/2017   Procedure: APPLICATION OF INTREGRA;  Surgeon: Irene Limbo, MD;  Location: [place];  Service: Plastics;  Laterality: N/A;  . EXCISION MASS HEAD N/A 03/20/2017   Procedure: EXCISION scalp lesion;  Surgeon: Irene Limbo, MD;  Location: [place];  Service: Plastics;  Laterality: N/A;  EXCISION scalp lesion  . IR ANGIO INTRA EXTRACRAN SEL COM CAROTID INNOMINATE BILAT MOD SED  11/23/2016  . IR ANGIO VERTEBRAL SEL VERTEBRAL UNI L MOD SED  11/23/2016  . LOOP RECORDER INSERTION N/A 11/23/2016   Procedure: LOOP RECORDER INSERTION;  Surgeon: [name], MD;  Location: [place] [facility];  Service: Cardiovascular;  Laterality: N/A;  . MASS EXCISION N/A 04/23/2017   Procedure: EXCISION MALIGNANT LESION OF SCALP;  Surgeon: Irene Limbo, MD;  Location: [place];  Service: Plastics;  Laterality: N/A;  . MELANOMA EXCISION  04/23/2017   BILATERAL SUPERFICIAL PAROTIDECTOMY/NECK SENTINEL NODE BIOPSY WITH FROZEN SECTION; LEFT SELECTIVE NECK DISSECTION  . PAROTIDECTOMY Bilateral 04/23/2017   Procedure: BILATERAL SUPERFICIAL PAROTIDECTOMY/NECK SENTINEL NODE BIOPSY WITH FROZEN SECTION; LEFT SELECTIVE NECK DISSECTION;  Surgeon: [name], MD;  Location: [place];  Service: ENT;  Laterality: Bilateral;    FAMILY HISTORY: Family History  Problem Relation Age of Onset  . Lung cancer Father     SOCIAL HISTORY: Social History   Socioeconomic History  . Marital status: Widowed    Spouse name: Not on file  . Number of children: Not on file  . Years of education: Not on file  . Highest education level: Not on file  Occupational History  . Not on file  Social Needs  . Financial resource strain: Not on file  . Food insecurity:    Worry: Not on file    Inability: Not on file  . Transportation  needs:    Medical: Not on file    Non-medical: Not on file  Tobacco Use  . Smoking status: Former Smoker    Years: 40.00  . Smokeless tobacco: Never Used  . Tobacco comment: quit   Substance and Sexual Activity  . Alcohol use: No  . Drug use: No  . Sexual activity: Not on file  Lifestyle  . Physical activity:    Days per week: Not on file    Minutes per session: Not on file  . Stress: Not on file  Relationships  . Social connections:    Talks on phone: Not on file    Gets together: Not on file    Attends religious service: Not on file    Active member of club or organization: Not on file    Attends meetings of clubs or organizations: Not on file    Relationship status: Not on file  . Intimate partner violence:    Fear of current or ex partner: Not on file    Emotionally abused: Not on file    Physically abused: Not on file    Forced sexual activity: Not on  file  Other Topics Concern  . Not on file  Social History Narrative  . Not on file     PHYSICAL EXAM  Vitals:   05/08/17 1006  BP: 114/64  Pulse: 89  Weight: 108 lb 9.6 oz (49.3 kg)  Height: 5\' 9"  (1.753 m)   Body mass index is 16.04 kg/m.  Generalized: Well developed, in no acute distress  Head: normocephalic and atraumatic,. Oropharynx benign  Neck: Supple, no carotid bruits  Cardiac: Regular rate rhythm, no murmur  Musculoskeletal: No deformity   Neurological examination   Mentation: Alert oriented to time, place, history taking. Attention span and concentration appropriate. Recent and remote memory intact.  Follows all commands speech and language fluent.   Cranial nerve II-XII: Pupils were equal round reactive to light extraocular movements were full, visual field were full on confrontational test. Facial sensation and strength were normal. hearing was intact to finger rubbing bilaterally. Uvula tongue midline. head turning and shoulder shrug were normal and symmetric.Tongue protrusion into cheek  strength was normal. Motor: normal bulk and tone, full strength in the BUE, BLE,  Sensory: normal and symmetric to light touch, pinprick, and  Vibration, in the upper and lower extremities Coordination: finger-nose-finger, heel-to-shin bilaterally, no dysmetria Reflexes: 1+ upper and lower and symmetric, plantar responses were flexor bilaterally. Gait and Station: Rising up from seated position without assistance, normal stance,  moderate stride, good arm swing, smooth turning, able to perform tiptoe, and heel walking without difficulty. Tandem gait is steady.  No assistive device  DIAGNOSTIC DATA (LABS, IMAGING, TESTING) - I reviewed patient records, labs, notes, testing and imaging myself where available.  Lab Results  Component Value Date   WBC 4.7 04/23/2017   HGB 12.6 (L) 04/23/2017   HCT 38.9 (L) 04/23/2017   MCV 94.0 04/23/2017   PLT 171 04/23/2017      Component Value Date/Time   NA 139 04/23/2017 0704   K 4.2 04/23/2017 0704   CL 107 04/23/2017 0704   CO2 23 04/23/2017 0704   GLUCOSE 94 04/23/2017 0704   BUN 22 (H) 04/23/2017 0704   CREATININE 2.39 (H) 04/23/2017 0704   CALCIUM 8.9 04/23/2017 0704   CALCIUM 7.5 (L) 07/11/2010 0400   PROT 5.8 (L) 11/23/2016 1854   ALBUMIN 2.9 (L) 11/23/2016 1854   AST 16 11/23/2016 1854   ALT 9 (L) 11/23/2016 1854   ALKPHOS 68 11/23/2016 1854   BILITOT 0.6 11/23/2016 1854   GFRNONAA 25 (L) 04/23/2017 0704   GFRAA 29 (L) 04/23/2017 0704   Lab Results  Component Value Date   CHOL 183 11/20/2016   HDL 35 (L) 11/20/2016   LDLCALC 125 (H) 11/20/2016   TRIG 115 11/20/2016   CHOLHDL 5.2 11/20/2016   Lab Results  Component Value Date   HGBA1C 5.5 11/20/2016    Lab Results  Component Value Date   TSH 5.170 (H) 11/20/2016      ASSESSMENT AND PLAN  77 y.o. Caucasian male with PMH of never sees doctors before admitted for right side weakness and slurry speech. CT head showed trace left frontal SAH. MRI left MCA infarct. MRA  showed left ICA occlusion. CUS left ICA occlusion but also right ICA 60-79% stenosis. Repeat CT showed resolution of trace SAH. DSA further confirmed left ICA occlusion but right ICA only 45% stenosis. EF 55-60%. LE venous doppler negative. LDL 125 and A1C 5.5. Discharged on ASA 81mg  and lipitor 40 to CIR. Neuro deficit resolved with therapy.  Large melanoma on scalp  removed by dermatology.  Recent visit by ophthalmology, patient has cataracts and severe glaucoma. On STROKE AF trial, so far no afib.  Plan: Stressed the importance of management of risk factors to prevent further stroke Continue aspirin 325 mg for secondary stroke prevention Maintain strict control of hypertension with blood pressure goal below 130/90, today's reading114/64 stay well hydrated. Control of diabetes with hemoglobin A1c below 6.5 followed by primary care Cholesterol with LDL cholesterol less than 70, followed by primary care,   continue statin drug Lipitor Exercise by walking, slowly increase , eat healthy diet with whole grains,  fresh fruits and vegetables Follow-up with primary care for stroke risk factor modification, maintain blood pressure goal less than 932 systolic, diabetes with I7T below 7, lipids with LDL below 70 Repeat carotid doppler  and consider vascular surgery referral for continued right ICA stenosis monitoring  - Follow up with your primary care physician for stroke risk factor modification. Recommend maintain blood pressure goal <130/80, diabetes with hemoglobin A1c goal below 7.0% and lipids with LDL cholesterol goal below 70 mg/dL.  Pt does not curently have PCP but DIL is working on this Follow up 6 months Dennie Bible, [facility], [facility], APRN  [facility] [address], [place], [place] [address] [phone]

## 2017-05-08 ENCOUNTER — Encounter: Payer: Self-pay | Admitting: Nurse Practitioner

## 2017-05-08 ENCOUNTER — Ambulatory Visit (INDEPENDENT_AMBULATORY_CARE_PROVIDER_SITE_OTHER): Payer: Medicare Other | Admitting: Nurse Practitioner

## 2017-05-08 ENCOUNTER — Telehealth: Payer: Self-pay | Admitting: Cardiology

## 2017-05-08 VITALS — BP 114/64 | HR 89 | Ht 69.0 in | Wt 108.6 lb

## 2017-05-08 DIAGNOSIS — E785 Hyperlipidemia, unspecified: Secondary | ICD-10-CM

## 2017-05-08 DIAGNOSIS — I63232 Cerebral infarction due to unspecified occlusion or stenosis of left carotid arteries: Secondary | ICD-10-CM | POA: Diagnosis not present

## 2017-05-08 DIAGNOSIS — I6521 Occlusion and stenosis of right carotid artery: Secondary | ICD-10-CM | POA: Diagnosis not present

## 2017-05-08 DIAGNOSIS — I6522 Occlusion and stenosis of left carotid artery: Secondary | ICD-10-CM | POA: Diagnosis not present

## 2017-05-08 NOTE — Progress Notes (Signed)
I reviewed above note and agree with the assessment and plan.  Rosalin Hawking, MD PhD Stroke Neurology 05/08/2017 3:01 PM

## 2017-05-08 NOTE — Telephone Encounter (Signed)
Spoke w/ pt and requested that he send a manual transmission b/c his home monitor has not updated in at least 14 days.   

## 2017-05-08 NOTE — Patient Instructions (Addendum)
Stressed the importance of management of risk factors to prevent further stroke Continue aspirin 325 mg for secondary stroke prevention Maintain strict control of hypertension with blood pressure goal below 130/90, today's reading114/64 stay well hydrated. Control of diabetes with hemoglobin A1c below 6.5 followed by primary care Cholesterol with LDL cholesterol less than 70, followed by primary care,   continue statin drug Lipitor Exercise by walking, slowly increase , eat healthy diet with whole grains,  fresh fruits and vegetables Follow-up with primary care for stroke risk factor modification, maintain blood pressure goal less than 390 systolic, diabetes with Z0S below 7, lipids with LDL below 70 Repeat carotid doppler  and consider vascular surgery referral for continued right ICA stenosis monitoring  - Follow up with your primary care physician for stroke risk factor modification. Recommend maintain blood pressure goal <130/80, diabetes with hemoglobin A1c goal below 7.0% and lipids with LDL cholesterol goal below 70 mg/dL.  Follow up 6 months

## 2017-05-10 DIAGNOSIS — Z483 Aftercare following surgery for neoplasm: Secondary | ICD-10-CM | POA: Diagnosis not present

## 2017-05-10 DIAGNOSIS — C434 Malignant melanoma of scalp and neck: Secondary | ICD-10-CM | POA: Diagnosis not present

## 2017-05-11 ENCOUNTER — Encounter: Payer: Self-pay | Admitting: *Deleted

## 2017-05-16 ENCOUNTER — Telehealth: Payer: Self-pay | Admitting: Oncology

## 2017-05-16 ENCOUNTER — Inpatient Hospital Stay: Payer: Medicare Other | Attending: Oncology | Admitting: Oncology

## 2017-05-16 VITALS — BP 124/53 | HR 80 | Temp 98.0°F | Resp 17 | Ht 69.0 in | Wt 110.8 lb

## 2017-05-16 DIAGNOSIS — C434 Malignant melanoma of scalp and neck: Secondary | ICD-10-CM | POA: Diagnosis not present

## 2017-05-16 NOTE — Telephone Encounter (Signed)
Appointments scheduled AVS/Calendar printed per 5/1 los °

## 2017-05-16 NOTE — Progress Notes (Signed)
Reason for Referral: Melanoma  HPI: 77 year old gentleman currently of [place] referred to me for the evaluation of scalp melanoma.  He was found to have a scalp lesion during his hospitalization in November 2018 for a stroke.  That was evaluated by Dr. Ronnald Ramp from dermatology and a biopsy revealed malignant melanoma.  The biopsy showed at least a 9 mm nodular melanoma and subsequently underwent excision of a malignant scalp lesion as well as radical neck dissection and parotid gland resection performed by Dr. Iran Planas and Dr. Constance Holster on April 23, 2017.  The final pathology revealed the final thickness of the melanoma is 1.5 cm with ulceration.  0 out of 6 lymph nodes did not show any evidence of disease.  The melanoma touches the deep margin focally.  Since his operation, he is recovering slowly and have regained most activities of daily living.  He is able to eat better and his pain is improved.  He continues to improve with energy standpoint and his quality of life remain intact.  He does not report any headaches, blurry vision, syncope or seizures. Does not report any fevers, chills or sweats.  Does not report any cough, wheezing or hemoptysis.  Does not report any chest pain, palpitation, orthopnea or leg edema.  Does not report any nausea, vomiting or abdominal pain.  Does not report any constipation or diarrhea.  Does not report any skeletal complaints.    Does not report frequency, urgency or hematuria.  Does not report any skin rashes or lesions. Does not report any heat or cold intolerance.  Does not report any lymphadenopathy or petechiae.  Does not report any anxiety or depression.  Remaining review of systems is negative.    Past Medical History:  Diagnosis Date  . Cancer ([place])    Melanoma  . Cataract    planning surgery  . Chronic kidney disease   . Complication of anesthesia   . GERD (gastroesophageal reflux disease)    tums prn  . Glaucoma   . History of hiatal hernia   .  Hypothyroidism   . Obstructive uropathy   . PONV (postoperative nausea and vomiting) 03/20/2017  . Stroke [facility])    specch and right side at time of stroke 04/21/27- all function return to normal  :  Past Surgical History:  Procedure Laterality Date  . APPLICATION OF A-CELL OF HEAD/NECK N/A 04/23/2017   Procedure: APPLICATION OF INTREGRA;  Surgeon: Irene Limbo, MD;  Location: [place];  Service: Plastics;  Laterality: N/A;  . EXCISION MASS HEAD N/A 03/20/2017   Procedure: EXCISION scalp lesion;  Surgeon: Irene Limbo, MD;  Location: [place];  Service: Plastics;  Laterality: N/A;  EXCISION scalp lesion  . IR ANGIO INTRA EXTRACRAN SEL COM CAROTID INNOMINATE BILAT MOD SED  11/23/2016  . IR ANGIO VERTEBRAL SEL VERTEBRAL UNI L MOD SED  11/23/2016  . LOOP RECORDER INSERTION N/A 11/23/2016   Procedure: LOOP RECORDER INSERTION;  Surgeon: [name], MD;  Location: [place] [facility];  Service: Cardiovascular;  Laterality: N/A;  . MASS EXCISION N/A 04/23/2017   Procedure: EXCISION MALIGNANT LESION OF SCALP;  Surgeon: Irene Limbo, MD;  Location: [place];  Service: Plastics;  Laterality: N/A;  . MELANOMA EXCISION  04/23/2017   BILATERAL SUPERFICIAL PAROTIDECTOMY/NECK SENTINEL NODE BIOPSY WITH FROZEN SECTION; LEFT SELECTIVE NECK DISSECTION  . PAROTIDECTOMY Bilateral 04/23/2017   Procedure: BILATERAL SUPERFICIAL PAROTIDECTOMY/NECK SENTINEL NODE BIOPSY WITH FROZEN SECTION; LEFT SELECTIVE NECK DISSECTION;  Surgeon: [name], MD;  Location: [place];  Service: ENT;  Laterality: Bilateral;  :   Current Outpatient Medications:  .  aspirin EC 325 MG tablet, Take 1 tablet (325 mg total) by mouth daily. (Patient taking differently: Take 325 mg by mouth at bedtime. ), Disp: 30 tablet, Rfl: 0 .  atorvastatin (LIPITOR) 40 MG tablet, Take 1 tablet (40 mg total) by mouth daily at 6 PM. (Patient taking differently: Take 40 mg by mouth daily. ), Disp: 30 tablet, Rfl: 1 .  traMADol (ULTRAM) 50 MG tablet, Take 1  tablet (50 mg total) by mouth every 12 (twelve) hours as needed. (Patient not taking: Reported on 05/08/2017), Disp: 10 tablet, Rfl: 0:  Allergies  Allergen Reactions  . Penicillins Other (See Comments)    UNSPECIFIED REACTION  Has patient had a PCN reaction causing immediate rash, facial/tongue/throat swelling, SOB or lightheadedness with hypotension: Unknown Has patient had a PCN reaction causing severe rash involving mucus membranes or skin necrosis: Unknown Has patient had a PCN reaction that required hospitalization: Unknown Has patient had a PCN reaction occurring within the last 10 years: No If all of the above answers are "NO", then may proceed with Cephalosporin use.   :  Family History  Problem Relation Age of Onset  . Lung cancer Father   :  Social History   Socioeconomic History  . Marital status: Widowed    Spouse name: Not on file  . Number of children: Not on file  . Years of education: Not on file  . Highest education level: Not on file  Occupational History  . Not on file  Social Needs  . Financial resource strain: Not on file  . Food insecurity:    Worry: Not on file    Inability: Not on file  . Transportation needs:    Medical: Not on file    Non-medical: Not on file  Tobacco Use  . Smoking status: Former Smoker    Years: 40.00  . Smokeless tobacco: Never Used  . Tobacco comment: quit   Substance and Sexual Activity  . Alcohol use: No  . Drug use: No  . Sexual activity: Not on file  Lifestyle  . Physical activity:    Days per week: Not on file    Minutes per session: Not on file  . Stress: Not on file  Relationships  . Social connections:    Talks on phone: Not on file    Gets together: Not on file    Attends religious service: Not on file    Active member of club or organization: Not on file    Attends meetings of clubs or organizations: Not on file    Relationship status: Not on file  . Intimate partner violence:    Fear of current or ex  partner: Not on file    Emotionally abused: Not on file    Physically abused: Not on file    Forced sexual activity: Not on file  Other Topics Concern  . Not on file  Social History Narrative  . Not on file  :  Pertinent items are noted in HPI.  Exam: Blood pressure (!) 124/53, pulse 80, temperature 98 F (36.7 C), temperature source Oral, resp. rate 17, height 5\' 9"  (1.753 m), weight 110 lb 12.8 oz (50.3 kg), SpO2 98 %. General appearance: alert and cooperative appeared without distress. Head: atraumatic without any abnormalities. Eyes: conjunctivae/corneas clear. PERRL.  Sclera anicteric. Throat: lips, mucosa, and tongue normal; without oral thrush or ulcers. Resp: clear to auscultation bilaterally  without rhonchi, wheezes or dullness to percussion. Cardio: regular rate and rhythm, S1, S2 normal, no murmur, click, rub or gallop GI: soft, non-tender; bowel sounds normal; no masses,  no organomegaly Skin: Well-healed scar noted on his neck bilaterally without any ulcers or lesions. Lymph nodes: Cervical, supraclavicular, and axillary nodes normal. Neurologic: Grossly normal without any motor, sensory or deep tendon reflexes. Musculoskeletal: No joint deformity or effusion.  CBC    Component Value Date/Time   WBC 4.7 04/23/2017 0704   RBC 4.14 (L) 04/23/2017 0704   HGB 12.6 (L) 04/23/2017 0704   HCT 38.9 (L) 04/23/2017 0704   PLT 171 04/23/2017 0704   MCV 94.0 04/23/2017 0704   MCH 30.4 04/23/2017 0704   MCHC 32.4 04/23/2017 0704   RDW 13.3 04/23/2017 0704   LYMPHSABS 1.2 11/23/2016 1854   MONOABS 0.4 11/23/2016 1854   EOSABS 0.2 11/23/2016 1854   BASOSABS 0.0 11/23/2016 1854     Chemistry      Component Value Date/Time   NA 139 04/23/2017 0704   K 4.2 04/23/2017 0704   CL 107 04/23/2017 0704   CO2 23 04/23/2017 0704   BUN 22 (H) 04/23/2017 0704   CREATININE 2.39 (H) 04/23/2017 0704      Component Value Date/Time   CALCIUM 8.9 04/23/2017 0704   CALCIUM 7.5 (L)  07/11/2010 0400   ALKPHOS 68 11/23/2016 1854   AST 16 11/23/2016 1854   ALT 9 (L) 11/23/2016 1854   BILITOT 0.6 11/23/2016 1854       Assessment and Plan:   77 year old gentleman with the following issues:  1.  Malignant melanoma of the scalp diagnosed in March 2019.  He underwent wide excision of the scalp as well as cervical lymph node dissection completed on April 23, 2017.  The final pathological staging showed a stage IIC melanoma with depth of invasion of 1.5 cm and ulceration.  He had 0 out of 6 lymph node involvement of malignancy.  His margins are marginally positive.  His case was discussed in the melanoma multidisciplinary conference and the natural course of this disease was discussed today with the patient.  He represents high risk melanoma with the risk of metastatic disease as well as local recurrence.  First step is to complete his staging work-up and we will obtain a PET scan to complete this process in the immediate future.  If his PET scan shows no evidence of metastatic disease, I see no role for systemic therapy at this time.  Adjuvant immunotherapy is not approved for his stage and the only approved adjuvant therapy would be interferon.  He is not a candidate for interferon therapy at this time.  The alternative strategy would be observation and surveillance with periodic monitoring and routine imaging studies.  If he develops systemic disease, immunotherapy would be a reasonable option at this time.  He does have high PDL 1 expression although it is unclear how will this correlate with his potential response to his therapy.  From a local control standpoint, I am in favor of obtaining a negative margin if possible with repeat surgery to prevent local recurrence and possible CNS metastasis or dural metastasis from that local positive margin.  All his questions were answered today to his satisfaction.  2.  Follow-up: Will be in the next 3 months to follow his progress unless  his PET imaging is abnormal.  60  minutes was spent with the patient face-to-face today.  More than 50% of time was dedicated to  patient counseling, education and answering question regarding his future plan of care.

## 2017-05-21 DIAGNOSIS — H2513 Age-related nuclear cataract, bilateral: Secondary | ICD-10-CM | POA: Diagnosis not present

## 2017-05-21 DIAGNOSIS — H401134 Primary open-angle glaucoma, bilateral, indeterminate stage: Secondary | ICD-10-CM | POA: Diagnosis not present

## 2017-05-22 ENCOUNTER — Telehealth: Payer: Self-pay | Admitting: Cardiology

## 2017-05-22 NOTE — Telephone Encounter (Signed)
Spoke w/ pt and requested that he send a manual transmission b/c his home monitor has not updated in at least 14 days.   

## 2017-05-28 ENCOUNTER — Telehealth: Payer: Self-pay | Admitting: *Deleted

## 2017-05-28 NOTE — Pre-Procedure Instructions (Signed)
Keith [name]  05/28/2017      [id number] [address], [place] [address] Phone: [phone] Fax: [phone]    Your procedure is scheduled on May 20  Report to [facility] at [place].  Call this number if you have problems the morning of surgery:  [phone]   Remember:  Do not eat food or drink liquids after midnight.  Continue all medications as directed by your physician except follow these medication instructions before surgery below   Take these medicines the morning of surgery with A SIP OF WATER  Eye drops if needed  7 days prior to surgery STOP taking any Aspirin(unless otherwise instructed by your surgeon), Aleve, Naproxen, Ibuprofen, Motrin, Advil, Goody's, BC's, all herbal medications, fish oil, and all vitamins    Do not wear jewelry.  Do not wear lotions, powders, or perfumes, or deodorant.  Men may shave face and neck.  Do not bring valuables to the hospital.  [facility] is not responsible for any belongings or valuables.  Contacts, dentures or bridgework may not be worn into surgery.  Leave your suitcase in the car.  After surgery it may be brought to your room.  For patients admitted to the hospital, discharge time will be determined by your treatment team.  Patients discharged the day of surgery will not be allowed to drive home.    Special instructions:   [facility]- Preparing For Surgery  Before surgery, you can play an important role. Because skin is not sterile, your skin needs to be as free of germs as possible. You can reduce the number of germs on your skin by washing with CHG (chlorahexidine gluconate) Soap before surgery.  CHG is an antiseptic cleaner which kills germs and bonds with the skin to continue killing germs even after washing.  Oral Hygiene is also important to reduce your risk of infection.  Remember - BRUSH  YOUR TEETH THE MORNING OF SURGERY  Please do not use if you have an allergy to CHG or antibacterial soaps. If your skin becomes reddened/irritated stop using the CHG.  Do not shave (including legs and underarms) for at least 48 hours prior to first CHG shower. It is OK to shave your face.  Please follow these instructions carefully.   1. Shower the NIGHT BEFORE SURGERY and the MORNING OF SURGERY with CHG.   2. If you chose to wash your hair, wash your hair first as usual with your normal shampoo.  3. After you shampoo, rinse your hair and body thoroughly to remove the shampoo.  4. Use CHG as you would any other liquid soap. You can apply CHG directly to the skin and wash gently with a scrungie or a clean washcloth.   5. Apply the CHG Soap to your body ONLY FROM THE NECK DOWN.  Do not use on open wounds or open sores. Avoid contact with your eyes, ears, mouth and genitals (private parts). Wash Face and genitals (private parts)  with your normal soap.  6. Wash thoroughly, paying special attention to the area where your surgery will be performed.  7. Thoroughly rinse your body with warm water from the neck down.  8. DO NOT shower/wash with your normal soap after using and rinsing off the CHG Soap.  9. Pat yourself dry with a CLEAN TOWEL.  10. Wear CLEAN PAJAMAS to bed the night before surgery,  wear comfortable clothes the morning of surgery  11. Place CLEAN SHEETS on your bed the night of your first shower and DO NOT SLEEP WITH PETS.    Day of Surgery: Do not apply any deodorants/lotions. Please wear clean clothes to the hospital/surgery center.  Remember to brush your teeth.      Please read over the following fact sheets that you were given.

## 2017-05-28 NOTE — Telephone Encounter (Signed)
Release placed on Dr. Erlinda Hong in box for signature.

## 2017-05-29 ENCOUNTER — Encounter (HOSPITAL_COMMUNITY)
Admission: RE | Admit: 2017-05-29 | Discharge: 2017-05-29 | Disposition: A | Discharge status: Home / Self Care | Payer: Medicare Other | Source: Ambulatory Visit | Attending: Plastic Surgery | Admitting: Plastic Surgery

## 2017-05-29 ENCOUNTER — Encounter (HOSPITAL_COMMUNITY): Payer: Self-pay

## 2017-05-29 ENCOUNTER — Encounter: Payer: Worker's Compensation | Admitting: *Deleted

## 2017-05-29 ENCOUNTER — Other Ambulatory Visit: Payer: Self-pay

## 2017-05-29 DIAGNOSIS — K219 Gastro-esophageal reflux disease without esophagitis: Secondary | ICD-10-CM | POA: Insufficient documentation

## 2017-05-29 DIAGNOSIS — C434 Malignant melanoma of scalp and neck: Secondary | ICD-10-CM | POA: Diagnosis not present

## 2017-05-29 DIAGNOSIS — Z8673 Personal history of transient ischemic attack (TIA), and cerebral infarction without residual deficits: Secondary | ICD-10-CM | POA: Diagnosis not present

## 2017-05-29 DIAGNOSIS — Z01818 Encounter for other preprocedural examination: Secondary | ICD-10-CM | POA: Insufficient documentation

## 2017-05-29 DIAGNOSIS — H409 Unspecified glaucoma: Secondary | ICD-10-CM | POA: Insufficient documentation

## 2017-05-29 DIAGNOSIS — R948 Abnormal results of function studies of other organs and systems: Secondary | ICD-10-CM | POA: Insufficient documentation

## 2017-05-29 DIAGNOSIS — E039 Hypothyroidism, unspecified: Secondary | ICD-10-CM | POA: Diagnosis not present

## 2017-05-29 DIAGNOSIS — I700 Atherosclerosis of aorta: Secondary | ICD-10-CM | POA: Insufficient documentation

## 2017-05-29 DIAGNOSIS — Z01812 Encounter for preprocedural laboratory examination: Secondary | ICD-10-CM | POA: Diagnosis not present

## 2017-05-29 DIAGNOSIS — Z79899 Other long term (current) drug therapy: Secondary | ICD-10-CM | POA: Insufficient documentation

## 2017-05-29 DIAGNOSIS — Z006 Encounter for examination for normal comparison and control in clinical research program: Secondary | ICD-10-CM

## 2017-05-29 DIAGNOSIS — Z9889 Other specified postprocedural states: Secondary | ICD-10-CM | POA: Diagnosis not present

## 2017-05-29 DIAGNOSIS — Z7982 Long term (current) use of aspirin: Secondary | ICD-10-CM | POA: Diagnosis not present

## 2017-05-29 DIAGNOSIS — H269 Unspecified cataract: Secondary | ICD-10-CM | POA: Insufficient documentation

## 2017-05-29 DIAGNOSIS — N189 Chronic kidney disease, unspecified: Secondary | ICD-10-CM | POA: Insufficient documentation

## 2017-05-29 LAB — BASIC METABOLIC PANEL
Anion gap: 9 (ref 5–15)
BUN: 19 mg/dL (ref 6–20)
CO2: 25 mmol/L (ref 22–32)
Calcium: 9.1 mg/dL (ref 8.9–10.3)
Chloride: 107 mmol/L (ref 101–111)
Creatinine, Ser: 2.24 mg/dL — ABNORMAL HIGH (ref 0.61–1.24)
GFR calc Af Amer: 31 mL/min — ABNORMAL LOW (ref >60)
GFR calc non Af Amer: 27 mL/min — ABNORMAL LOW (ref >60)
Glucose, Bld: 77 mg/dL (ref 65–99)
Potassium: 4.5 mmol/L (ref 3.5–5.1)
Sodium: 141 mmol/L (ref 135–145)

## 2017-05-29 LAB — CBC
HCT: 40.3 % (ref 39.0–52.0)
Hemoglobin: 12.4 g/dL — ABNORMAL LOW (ref 13.0–17.0)
MCH: 29 pg (ref 26.0–34.0)
MCHC: 30.8 g/dL (ref 30.0–36.0)
MCV: 94.2 fL (ref 78.0–100.0)
Platelets: 282 10*3/uL (ref 150–400)
RBC: 4.28 MIL/uL (ref 4.22–5.81)
RDW: 12.8 % (ref 11.5–15.5)
WBC: 7 10*3/uL (ref 4.0–10.5)

## 2017-05-29 NOTE — Progress Notes (Signed)
STROKE~AF Research study month 6 follow up completed. Patient loop interrogated. ASA increased to 325mg  in jan. Modified Rankin Scale 0.

## 2017-05-29 NOTE — Progress Notes (Addendum)
PCP - denies Cardiologist -sees Dr. Rayann Heman for loop recorder for stroke research    Chest x-ray - not needed EKG - 06/20/17 Stress Test -denies  ECHO - 2018 Cardiac Cath-denies -    Aspirin Instructions:office notified of needing aspirin instructions, Spoke with Katie in the office who will send the message to Dr. Iran Planas, also spoke to the [place] Coordinator in the office who will also let me know about instructions  Anesthesia review: yes, patient described that he has difficulty with swallowing and talking after general anesthesia,  Also takes him about a week to recover from general anesthesia. Instructed to continue aspirin patient notified  Patient denies shortness of breath, fever, cough and chest pain at PAT appointment   Patient verbalized understanding of instructions that were given to them at the PAT appointment. Patient was also instructed that they will need to review over the PAT instructions again at home before surgery.

## 2017-05-30 ENCOUNTER — Ambulatory Visit (HOSPITAL_COMMUNITY)
Admission: RE | Admit: 2017-05-30 | Discharge: 2017-05-30 | Disposition: A | Discharge status: Home / Self Care | Payer: Medicare Other | Source: Ambulatory Visit | Attending: Oncology | Admitting: Oncology

## 2017-05-30 ENCOUNTER — Encounter (HOSPITAL_COMMUNITY): Payer: Self-pay

## 2017-05-30 DIAGNOSIS — Z01812 Encounter for preprocedural laboratory examination: Secondary | ICD-10-CM | POA: Diagnosis not present

## 2017-05-30 DIAGNOSIS — R948 Abnormal results of function studies of other organs and systems: Secondary | ICD-10-CM | POA: Diagnosis not present

## 2017-05-30 DIAGNOSIS — Z9889 Other specified postprocedural states: Secondary | ICD-10-CM | POA: Diagnosis not present

## 2017-05-30 DIAGNOSIS — C434 Malignant melanoma of scalp and neck: Secondary | ICD-10-CM

## 2017-05-30 DIAGNOSIS — Z01818 Encounter for other preprocedural examination: Secondary | ICD-10-CM | POA: Diagnosis not present

## 2017-05-30 DIAGNOSIS — C439 Malignant melanoma of skin, unspecified: Secondary | ICD-10-CM | POA: Diagnosis not present

## 2017-05-30 DIAGNOSIS — Z79899 Other long term (current) drug therapy: Secondary | ICD-10-CM | POA: Diagnosis not present

## 2017-05-30 LAB — GLUCOSE, CAPILLARY: Glucose-Capillary: 80 mg/dL (ref 65–99)

## 2017-05-30 MED ORDER — FLUDEOXYGLUCOSE F - 18 (FDG) INJECTION: 5.7900 | Freq: Once | INTRAVENOUS | Status: DC

## 2017-05-30 NOTE — Progress Notes (Signed)
Anesthesia Chart Review:   Case:  [id number] Date/Time:  06/04/17 0715   Procedure:  SKIN GRAFT SPLIT THICKNESS TO SCALP FROM RIGHT OR LEFT THIGH POSSIBLE A CELL TO DONOR SITE (N/A Scalp)   Anesthesia type:  General   Pre-op diagnosis:  Open wound scalp, s/p excision melanoma   Location:  [facility] OR ROOM 08 / [place] [facility]   Surgeon:  Irene Limbo, MD      DISCUSSION:   - Pt is a 77 year old male with hx stroke (11/2016), melanoma. S/p 2 similar surgeries 04/23/17 and 03/20/17  - Hospitalized 59/9-35/7/01 for embolic stroke.   - Poor renal function (Cr ~2.2) since stroke last fall.  Pt does not have PCP or nephrologist.    Anesthesia history: pt reports has difficulty with swallowing and talking after general anesthesia; also takes him about a week to recover from general anesthesia.  He attributes these issues to his hiatal hernia.     VS: BP (!) 123/51   Pulse 64   Temp (!) 36.4 C   Resp 18   Ht 5\' 9"  (1.753 m)   Wt 111 lb 3.2 oz (50.4 kg)   SpO2 100%   BMI 16.42 kg/m   PROVIDERS: - No PCP  - Neurologist is Rosalin Hawking, MD. Last office visit 05/08/17 with Cecille Rubin, NP - Oncologist is Zola Button, MD - Sees cardiologist [name], MD only for monitoring of loop recorder placed after stroke (is part of clinical trial about stroke and afb)   LABS: Labs reviewed: Acceptable for surgery.  - Renal function consistent with prior results (Cr 2.18-2.39 over last 6 months)  (all labs ordered are listed, but only abnormal results are displayed)  Labs Reviewed  BASIC METABOLIC PANEL - Abnormal; Notable for the following components:      Result Value   Creatinine, Ser 2.24 (*)    GFR calc non Af Amer 27 (*)    GFR calc Af Amer 31 (*)    All other components within normal limits  CBC - Abnormal; Notable for the following components:   Hemoglobin 12.4 (*)    All other components within normal limits     EKG 03/20/17: Sinus bradycardia (58 bpm). RBBB. LAFB.   CV:  Carotid,  innominate,vertebral artery angiograms 11/23/16: -Angiographically occluded left internal carotid artery at the bulb without evidence of an angiographic string sign. -Distal reconstitution of the distal cavernous and the petrous left ICA via the ophthalmic artery from the nasolacrimal branches of the left external carotid artery. -Antegrade flow noted into the supraclinoid left ICA and subsequently minimally the left middle cerebral artery distribution. -Approximately 45% stenosis of the right internal carotid artery proximally associated with a small ulcerated plaque. -Intracranial mild atherosclerotic changes involving both internal carotid arteries.  Echo 11/21/16: - Left ventricle: The cavity size was normal. Wall thickness wasnormal. Systolic function was normal. The estimated ejectionfraction was in the range of 55% to 60%. Wall motion was normal;there were no regional wall motion abnormalities. Features areconsistent with a pseudonormal left ventricular filling pattern,with concomitant abnormal relaxation and increased filling pressure (grade 2 diastolic dysfunction). - Aortic valve: There was no stenosis. There was mildregurgitation. - Mitral valve: There was trivial regurgitation. - Right ventricle: The cavity size was normal. Systolic functionwas normal. - Pulmonary arteries: No complete TR doppler jet so unable toestimate PA systolic pressure. - Inferior vena cava: The vessel was normal in size. Therespirophasic diameter changes were in the normal range (>= 50%),consistent with normal central  venous pressure. -Impressions: Normal LV size with EF 55-60%. Moderate diastolic dysfunction.Normal RV size and systolic function. Mild aortic insufficiency.  Carotid duplex 11/20/16:  -Right Carotid: There is evidence in the right ICA of a 60-79% stenosis. -Left Carotid: The left ICA appears occluded. -Vertebrals: Both vertebral arteries were patent with antegrade  flow.     Past Medical History:  Diagnosis Date  . Cancer ([place])    Melanoma  . Cataract    planning surgery  . Chronic kidney disease    "weak Kidneys"  . Complication of anesthesia    patient expressed that he has difficulty post anesthesia with swallowing, chewing, and talking after surgery  . GERD (gastroesophageal reflux disease)    tums prn  . Glaucoma   . History of hiatal hernia   . Hypothyroidism   . Obstructive uropathy   . PONV (postoperative nausea and vomiting) 03/20/2017  . Stroke [facility])    specch and right side at time of stroke 04/21/27- all function return to normal    Past Surgical History:  Procedure Laterality Date  . APPLICATION OF A-CELL OF HEAD/NECK N/A 04/23/2017   Procedure: APPLICATION OF INTREGRA;  Surgeon: Irene Limbo, MD;  Location: [facility];  Service: Plastics;  Laterality: N/A;  . EXCISION MASS HEAD N/A 03/20/2017   Procedure: EXCISION scalp lesion;  Surgeon: Irene Limbo, MD;  Location: [facility] [place];  Service: Plastics;  Laterality: N/A;  EXCISION scalp lesion  . IR ANGIO INTRA EXTRACRAN SEL COM CAROTID INNOMINATE BILAT MOD SED  11/23/2016  . IR ANGIO VERTEBRAL SEL VERTEBRAL UNI L MOD SED  11/23/2016  . LOOP RECORDER INSERTION N/A 11/23/2016   Procedure: LOOP RECORDER INSERTION;  Surgeon: [name], MD;  Location: [place] CV LAB;  Service: Cardiovascular;  Laterality: N/A;  . MASS EXCISION N/A 04/23/2017   Procedure: EXCISION MALIGNANT LESION OF SCALP;  Surgeon: Irene Limbo, MD;  Location: [place];  Service: Plastics;  Laterality: N/A;  . MELANOMA EXCISION  04/23/2017   BILATERAL SUPERFICIAL PAROTIDECTOMY/NECK SENTINEL NODE BIOPSY WITH FROZEN SECTION; LEFT SELECTIVE NECK DISSECTION  . PAROTIDECTOMY Bilateral 04/23/2017   Procedure: BILATERAL SUPERFICIAL PAROTIDECTOMY/NECK SENTINEL NODE BIOPSY WITH FROZEN SECTION; LEFT SELECTIVE NECK DISSECTION;  Surgeon: [name], MD;  Location: [place];  Service: ENT;  Laterality: Bilateral;     MEDICATIONS: . aspirin EC 325 MG tablet  . atorvastatin (LIPITOR) 40 MG tablet  . brimonidine-timolol (COMBIGAN) 0.2-0.5 % ophthalmic solution   No current facility-administered medications for this encounter.     Pt tolerated surgery in March and April of this year. If no changes, I anticipate pt can proceed with surgery as scheduled.   Willeen Cass, FNP-BC [facility] Short Stay Surgical Center/Anesthesiology Phone: [phone] 05/30/2017 12:03 PM

## 2017-05-31 NOTE — Telephone Encounter (Signed)
Clearance form fax to [facility] associates at [phone]. Form fax and receive.

## 2017-06-01 MED ORDER — CLINDAMYCIN PHOSPHATE 900 MG/50ML IV SOLN
900.0000 mg | INTRAVENOUS | Status: AC
  Administered 2017-06-04: 900 mg via INTRAVENOUS
  Filled 2017-06-01: qty 50

## 2017-06-03 NOTE — Anesthesia Preprocedure Evaluation (Addendum)
Anesthesia Evaluation  Patient identified by MRN, date of birth, ID band Patient awake    Reviewed: Allergy & Precautions, NPO status , Patient's Chart, lab work & pertinent test results  History of Anesthesia Complications (+) PONV  Airway Mallampati: III  TM Distance: >3 FB Neck ROM: Full    Dental  (+) Edentulous Upper, Edentulous Lower   Pulmonary former smoker,    Pulmonary exam normal breath sounds clear to auscultation       Cardiovascular negative cardio ROS Normal cardiovascular exam Rhythm:Regular Rate:Normal  ECG: Bifascicular block, rate 54  Loop Recorder    Neuro/Psych CVA, No Residual Symptoms negative psych ROS   GI/Hepatic Neg liver ROS, hiatal hernia, GERD  Controlled,  Endo/Other  Hypothyroidism   Renal/GU Renal disease     Musculoskeletal negative musculoskeletal ROS (+)   Abdominal   Peds  Hematology HLD   Anesthesia Other Findings Open wound scalp, s/p excision melanoma  Reproductive/Obstetrics                            Anesthesia Physical Anesthesia Plan  ASA: III  Anesthesia Plan: General   Post-op Pain Management:    Induction: Intravenous  PONV Risk Score and Plan: 3 and Dexamethasone, Ondansetron and Treatment may vary due to age or medical condition  Airway Management Planned: Oral ETT and LMA  Additional Equipment:   Intra-op Plan:   Post-operative Plan: Extubation in OR  Informed Consent: I have reviewed the patients History and Physical, chart, labs and discussed the procedure including the risks, benefits and alternatives for the proposed anesthesia with the patient or authorized representative who has indicated his/her understanding and acceptance.   Dental advisory given  Plan Discussed with: CRNA  Anesthesia Plan Comments:         Anesthesia Quick Evaluation

## 2017-06-04 ENCOUNTER — Encounter (HOSPITAL_COMMUNITY)
Admission: RE | Disposition: A | Discharge status: Home / Self Care | Payer: Self-pay | Source: Ambulatory Visit | Attending: Plastic Surgery

## 2017-06-04 ENCOUNTER — Encounter (HOSPITAL_COMMUNITY): Payer: Self-pay

## 2017-06-04 ENCOUNTER — Observation Stay (HOSPITAL_COMMUNITY)
Admission: RE | Admit: 2017-06-04 | Discharge: 2017-06-05 | Disposition: A | Discharge status: Home / Self Care | Payer: Medicare Other | Source: Ambulatory Visit | Attending: Plastic Surgery | Admitting: Plastic Surgery

## 2017-06-04 ENCOUNTER — Inpatient Hospital Stay (HOSPITAL_COMMUNITY): Payer: Medicare Other | Admitting: Emergency Medicine

## 2017-06-04 ENCOUNTER — Inpatient Hospital Stay (HOSPITAL_COMMUNITY): Payer: Medicare Other | Admitting: Anesthesiology

## 2017-06-04 ENCOUNTER — Other Ambulatory Visit: Payer: Self-pay

## 2017-06-04 DIAGNOSIS — L98491 Non-pressure chronic ulcer of skin of other sites limited to breakdown of skin: Secondary | ICD-10-CM | POA: Diagnosis not present

## 2017-06-04 DIAGNOSIS — Z85820 Personal history of malignant melanoma of skin: Secondary | ICD-10-CM | POA: Insufficient documentation

## 2017-06-04 DIAGNOSIS — Z87891 Personal history of nicotine dependence: Secondary | ICD-10-CM | POA: Insufficient documentation

## 2017-06-04 DIAGNOSIS — Z7982 Long term (current) use of aspirin: Secondary | ICD-10-CM | POA: Insufficient documentation

## 2017-06-04 DIAGNOSIS — K219 Gastro-esophageal reflux disease without esophagitis: Secondary | ICD-10-CM | POA: Insufficient documentation

## 2017-06-04 DIAGNOSIS — N289 Disorder of kidney and ureter, unspecified: Secondary | ICD-10-CM | POA: Insufficient documentation

## 2017-06-04 DIAGNOSIS — Z8673 Personal history of transient ischemic attack (TIA), and cerebral infarction without residual deficits: Secondary | ICD-10-CM | POA: Diagnosis not present

## 2017-06-04 DIAGNOSIS — K449 Diaphragmatic hernia without obstruction or gangrene: Secondary | ICD-10-CM | POA: Diagnosis not present

## 2017-06-04 DIAGNOSIS — Z9889 Other specified postprocedural states: Secondary | ICD-10-CM | POA: Diagnosis not present

## 2017-06-04 DIAGNOSIS — E039 Hypothyroidism, unspecified: Secondary | ICD-10-CM | POA: Insufficient documentation

## 2017-06-04 DIAGNOSIS — C434 Malignant melanoma of scalp and neck: Secondary | ICD-10-CM | POA: Diagnosis present

## 2017-06-04 DIAGNOSIS — Z880 Allergy status to penicillin: Secondary | ICD-10-CM | POA: Insufficient documentation

## 2017-06-04 DIAGNOSIS — L98499 Non-pressure chronic ulcer of skin of other sites with unspecified severity: Secondary | ICD-10-CM | POA: Diagnosis not present

## 2017-06-04 DIAGNOSIS — E785 Hyperlipidemia, unspecified: Secondary | ICD-10-CM | POA: Diagnosis not present

## 2017-06-04 DIAGNOSIS — N183 Chronic kidney disease, stage 3 (moderate): Secondary | ICD-10-CM | POA: Diagnosis not present

## 2017-06-04 DIAGNOSIS — I452 Bifascicular block: Secondary | ICD-10-CM | POA: Insufficient documentation

## 2017-06-04 DIAGNOSIS — Z79899 Other long term (current) drug therapy: Secondary | ICD-10-CM | POA: Diagnosis not present

## 2017-06-04 HISTORY — PX: SKIN GRAFT SPLIT THICKNESS HEAD / NECK: SUR1302

## 2017-06-04 HISTORY — PX: SKIN SPLIT GRAFT: SHX444

## 2017-06-04 HISTORY — DX: Malignant melanoma of skin, unspecified: C43.9

## 2017-06-04 SURGERY — APPLICATION, GRAFT, SKIN, SPLIT-THICKNESS
Anesthesia: General | Site: Scalp

## 2017-06-04 MED ORDER — FENTANYL CITRATE (PF) 100 MCG/2ML IJ SOLN: 25.0000 ug | INTRAMUSCULAR | Status: DC | PRN

## 2017-06-04 MED ORDER — MINERAL OIL LIGHT 100 % EX OIL
TOPICAL_OIL | CUTANEOUS | Status: DC | PRN
  Administered 2017-06-04: 1 via TOPICAL

## 2017-06-04 MED ORDER — PROPOFOL 10 MG/ML IV BOLUS
INTRAVENOUS | Status: AC
  Filled 2017-06-04: qty 20

## 2017-06-04 MED ORDER — TIMOLOL MALEATE 0.5 % OP SOLN
1.0000 [drp] | Freq: Two times a day (BID) | OPHTHALMIC | Status: DC
  Administered 2017-06-04 (×2): 1 [drp] via OPHTHALMIC
  Filled 2017-06-04 (×2): qty 5

## 2017-06-04 MED ORDER — EPHEDRINE SULFATE 50 MG/ML IJ SOLN
INTRAMUSCULAR | Status: AC
  Filled 2017-06-04: qty 1

## 2017-06-04 MED ORDER — KCL IN DEXTROSE-NACL 20-5-0.2 MEQ/L-%-% IV SOLN
INTRAVENOUS | Status: DC
  Administered 2017-06-04: 13:00:00 via INTRAVENOUS
  Filled 2017-06-04 (×2): qty 1000

## 2017-06-04 MED ORDER — DEXAMETHASONE SODIUM PHOSPHATE 10 MG/ML IJ SOLN
INTRAMUSCULAR | Status: DC | PRN
  Administered 2017-06-04: 10 mg via INTRAVENOUS

## 2017-06-04 MED ORDER — HYDROCODONE-ACETAMINOPHEN 5-325 MG PO TABS: 1.0000 | ORAL_TABLET | ORAL | 0 refills | Status: DC | PRN

## 2017-06-04 MED ORDER — 0.9 % SODIUM CHLORIDE (POUR BTL) OPTIME
TOPICAL | Status: DC | PRN
  Administered 2017-06-04: 1000 mL

## 2017-06-04 MED ORDER — PROPOFOL 10 MG/ML IV BOLUS
INTRAVENOUS | Status: DC | PRN
  Administered 2017-06-04: 50 mg via INTRAVENOUS
  Administered 2017-06-04: 100 mg via INTRAVENOUS

## 2017-06-04 MED ORDER — ONDANSETRON HCL 4 MG/2ML IJ SOLN
4.0000 mg | Freq: Once | INTRAMUSCULAR | Status: DC | PRN
Start: 2017-06-04 — End: 2017-06-04

## 2017-06-04 MED ORDER — ONDANSETRON HCL 4 MG/2ML IJ SOLN: 4.0000 mg | Freq: Four times a day (QID) | INTRAMUSCULAR | Status: DC | PRN

## 2017-06-04 MED ORDER — BUPIVACAINE-EPINEPHRINE (PF) 0.25% -1:200000 IJ SOLN
INTRAMUSCULAR | Status: DC | PRN
  Administered 2017-06-04: 17 mL via PERINEURAL

## 2017-06-04 MED ORDER — ONDANSETRON HCL 4 MG/2ML IJ SOLN
INTRAMUSCULAR | Status: DC | PRN
  Administered 2017-06-04: 4 mg via INTRAVENOUS

## 2017-06-04 MED ORDER — EPHEDRINE SULFATE 50 MG/ML IJ SOLN
INTRAMUSCULAR | Status: DC | PRN
  Administered 2017-06-04 (×5): 5 mg via INTRAVENOUS

## 2017-06-04 MED ORDER — DEXAMETHASONE SODIUM PHOSPHATE 10 MG/ML IJ SOLN
INTRAMUSCULAR | Status: AC
  Filled 2017-06-04: qty 1

## 2017-06-04 MED ORDER — FENTANYL CITRATE (PF) 100 MCG/2ML IJ SOLN
INTRAMUSCULAR | Status: DC | PRN
  Administered 2017-06-04: 50 ug via INTRAVENOUS

## 2017-06-04 MED ORDER — PHENYLEPHRINE HCL 10 MG/ML IJ SOLN
INTRAVENOUS | Status: DC | PRN
  Administered 2017-06-04: 40 ug/min via INTRAVENOUS

## 2017-06-04 MED ORDER — LACTATED RINGERS IV SOLN
INTRAVENOUS | Status: DC | PRN
  Administered 2017-06-04: 07:00:00 via INTRAVENOUS

## 2017-06-04 MED ORDER — HYDROCODONE-ACETAMINOPHEN 5-325 MG PO TABS: 1.0000 | ORAL_TABLET | ORAL | Status: DC | PRN

## 2017-06-04 MED ORDER — SODIUM CHLORIDE 0.9 % IJ SOLN
INTRAMUSCULAR | Status: AC
  Filled 2017-06-04: qty 10

## 2017-06-04 MED ORDER — BRIMONIDINE TARTRATE-TIMOLOL 0.2-0.5 % OP SOLN
1.0000 [drp] | Freq: Two times a day (BID) | OPHTHALMIC | Status: DC
  Filled 2017-06-04: qty 5

## 2017-06-04 MED ORDER — LIDOCAINE HCL (CARDIAC) PF 100 MG/5ML IV SOSY
PREFILLED_SYRINGE | INTRAVENOUS | Status: DC | PRN
  Administered 2017-06-04: 60 mg via INTRAVENOUS

## 2017-06-04 MED ORDER — ONDANSETRON 4 MG PO TBDP: 4.0000 mg | ORAL_TABLET | Freq: Four times a day (QID) | ORAL | Status: DC | PRN

## 2017-06-04 MED ORDER — PHENYLEPHRINE HCL 10 MG/ML IJ SOLN
INTRAMUSCULAR | Status: DC | PRN
  Administered 2017-06-04 (×2): 100 ug via INTRAVENOUS

## 2017-06-04 MED ORDER — FENTANYL CITRATE (PF) 250 MCG/5ML IJ SOLN
INTRAMUSCULAR | Status: AC
  Filled 2017-06-04: qty 5

## 2017-06-04 MED ORDER — MINERAL OIL LIGHT 100 % EX OIL
TOPICAL_OIL | CUTANEOUS | Status: AC
  Filled 2017-06-04: qty 25

## 2017-06-04 MED ORDER — DIPHENHYDRAMINE HCL 50 MG/ML IJ SOLN: 12.5000 mg | Freq: Four times a day (QID) | INTRAMUSCULAR | Status: DC | PRN

## 2017-06-04 MED ORDER — BUPIVACAINE-EPINEPHRINE (PF) 0.25% -1:200000 IJ SOLN
INTRAMUSCULAR | Status: AC
  Filled 2017-06-04: qty 30

## 2017-06-04 MED ORDER — ATORVASTATIN CALCIUM 40 MG PO TABS
40.0000 mg | ORAL_TABLET | Freq: Every day | ORAL | Status: DC
  Administered 2017-06-04: 40 mg via ORAL
  Filled 2017-06-04: qty 1

## 2017-06-04 MED ORDER — MORPHINE SULFATE (PF) 4 MG/ML IV SOLN: 1.0000 mg | INTRAVENOUS | Status: DC | PRN

## 2017-06-04 MED ORDER — ASPIRIN EC 325 MG PO TBEC
325.0000 mg | DELAYED_RELEASE_TABLET | Freq: Every day | ORAL | Status: DC
  Administered 2017-06-04: 325 mg via ORAL
  Filled 2017-06-04: qty 1

## 2017-06-04 MED ORDER — LIDOCAINE 2% (20 MG/ML) 5 ML SYRINGE
INTRAMUSCULAR | Status: AC
  Filled 2017-06-04: qty 5

## 2017-06-04 MED ORDER — DIPHENHYDRAMINE HCL 12.5 MG/5ML PO ELIX: 12.5000 mg | ORAL_SOLUTION | Freq: Four times a day (QID) | ORAL | Status: DC | PRN

## 2017-06-04 MED ORDER — ONDANSETRON HCL 4 MG/2ML IJ SOLN
INTRAMUSCULAR | Status: AC
Start: 2017-06-04 — End: ?
  Filled 2017-06-04: qty 2

## 2017-06-04 MED ORDER — BRIMONIDINE TARTRATE 0.2 % OP SOLN
1.0000 [drp] | Freq: Two times a day (BID) | OPHTHALMIC | Status: DC
  Filled 2017-06-04 (×2): qty 5

## 2017-06-04 SURGICAL SUPPLY — 58 items
APPLIER CLIP 9.375 MED OPEN (MISCELLANEOUS)
BANDAGE ACE 4X5 VEL STRL LF (GAUZE/BANDAGES/DRESSINGS) ×2 IMPLANT
BANDAGE ACE 6X5 VEL STRL LF (GAUZE/BANDAGES/DRESSINGS) IMPLANT
BLADE CLIPPER SURG (BLADE) IMPLANT
BLADE DERMATOME SS (BLADE) ×2 IMPLANT
BLADE DERMATONE (BLADE) ×2 IMPLANT
BNDG COHESIVE 4X5 TAN STRL (GAUZE/BANDAGES/DRESSINGS) IMPLANT
BNDG COHESIVE 6X5 TAN STRL LF (GAUZE/BANDAGES/DRESSINGS) ×2 IMPLANT
BNDG GAUZE ELAST 4 BULKY (GAUZE/BANDAGES/DRESSINGS) ×2 IMPLANT
BUR SURG 4X8 MED (BURR) ×1 IMPLANT
BURR SURG 4X8 MED (BURR) ×2
CANISTER SUCT 3000ML PPV (MISCELLANEOUS) IMPLANT
CANISTER WOUND CARE 500ML ATS (WOUND CARE) IMPLANT
CLIP APPLIE 9.375 MED OPEN (MISCELLANEOUS) IMPLANT
CONT SPECI 4OZ STER CLIK (MISCELLANEOUS) IMPLANT
COVER SURGICAL LIGHT HANDLE (MISCELLANEOUS) ×2 IMPLANT
DERMACARRIERS GRAFT 1 TO 1.5 (DISPOSABLE) ×4
DRAIN WOUND SNY 15 RND (WOUND CARE) IMPLANT
DRAPE HALF SHEET 40X57 (DRAPES) ×4 IMPLANT
DRAPE INCISE IOBAN 66X45 STRL (DRAPES) IMPLANT
DRESSING TELFA 8X10 (GAUZE/BANDAGES/DRESSINGS) ×2 IMPLANT
DRSG ADAPTIC 3X8 NADH LF (GAUZE/BANDAGES/DRESSINGS) ×4 IMPLANT
DRSG PAD ABDOMINAL 8X10 ST (GAUZE/BANDAGES/DRESSINGS) ×4 IMPLANT
DRSG VAC ATS MED SENSATRAC (GAUZE/BANDAGES/DRESSINGS) ×2 IMPLANT
DRSG VAC ATS SM SENSATRAC (GAUZE/BANDAGES/DRESSINGS) IMPLANT
ELECT COATED BLADE 2.86 ST (ELECTRODE) ×2 IMPLANT
ELECT REM PT RETURN 9FT ADLT (ELECTROSURGICAL)
ELECTRODE REM PT RTRN 9FT ADLT (ELECTROSURGICAL) IMPLANT
EVACUATOR SILICONE 100CC (DRAIN) IMPLANT
GAUZE SPONGE 4X4 12PLY STRL (GAUZE/BANDAGES/DRESSINGS) ×2 IMPLANT
GEL ULTRASOUND 20GR AQUASONIC (MISCELLANEOUS) ×2 IMPLANT
GLOVE BIO SURGEON STRL SZ 6 (GLOVE) ×2 IMPLANT
GOWN STRL REUS W/ TWL LRG LVL3 (GOWN DISPOSABLE) ×2 IMPLANT
GOWN STRL REUS W/TWL LRG LVL3 (GOWN DISPOSABLE) ×2
GRAFT DERMACARRIERS 1 TO 1.5 (DISPOSABLE) ×2 IMPLANT
KIT BASIN OR (CUSTOM PROCEDURE TRAY) ×2 IMPLANT
KIT TURNOVER KIT B (KITS) ×2 IMPLANT
MATRIX WOUND 3-LAYER 7X10 (Tissue) ×2 IMPLANT
NS IRRIG 1000ML POUR BTL (IV SOLUTION) ×2 IMPLANT
PACK GENERAL/GYN (CUSTOM PROCEDURE TRAY) ×2 IMPLANT
PAD ABD 8X10 STRL (GAUZE/BANDAGES/DRESSINGS) ×2 IMPLANT
PAD ARMBOARD 7.5X6 YLW CONV (MISCELLANEOUS) ×4 IMPLANT
PADDING CAST COTTON 6X4 STRL (CAST SUPPLIES) IMPLANT
PIN SAFETY STERILE (MISCELLANEOUS) ×2 IMPLANT
STAPLER VISISTAT 35W (STAPLE) IMPLANT
STOCKINETTE IMPERVIOUS LG (DRAPES) ×2 IMPLANT
SUT CHROMIC 4 0 PS 2 18 (SUTURE) IMPLANT
SUT ETHILON 2 0 FS 18 (SUTURE) IMPLANT
SUT VIC AB 4-0 PS2 27 (SUTURE) IMPLANT
SUT VIC AB 5-0 P-3 18XBRD (SUTURE) IMPLANT
SUT VIC AB 5-0 P3 18 (SUTURE)
SWAB CULTURE ESWAB REG 1ML (MISCELLANEOUS) IMPLANT
SWAB CULTURE LIQUID MINI MALE (MISCELLANEOUS) IMPLANT
SYR CONTROL 10ML LL (SYRINGE) IMPLANT
TAPE CLOTH SURG 4X10 WHT LF (GAUZE/BANDAGES/DRESSINGS) IMPLANT
TOWEL OR 17X24 6PK STRL BLUE (TOWEL DISPOSABLE) ×2 IMPLANT
TOWEL OR 17X26 10 PK STRL BLUE (TOWEL DISPOSABLE) ×2 IMPLANT
UNDERPAD 30X30 (UNDERPADS AND DIAPERS) ×2 IMPLANT

## 2017-06-04 NOTE — Anesthesia Postprocedure Evaluation (Signed)
Anesthesia Post Note  Patient: Keith [name]  Procedure(s) Performed: SKIN GRAFT SPLIT THICKNESS TO SCALP FROM RIGHT OR LEFT THIGH POSSIBLE A CELL TO DONOR SITE (N/A Scalp)     Patient location during evaluation: PACU Anesthesia Type: General Level of consciousness: awake and alert Pain management: pain level controlled Vital Signs Assessment: post-procedure vital signs reviewed and stable Respiratory status: spontaneous breathing, nonlabored ventilation, respiratory function stable and patient connected to nasal cannula oxygen Cardiovascular status: blood pressure returned to baseline and stable Postop Assessment: no apparent nausea or vomiting Anesthetic complications: no    Last Vitals:  Vitals:   06/04/17 1150 06/04/17 1324  BP: (!) 121/53 (!) 116/57  Pulse: (!) 58 60  Resp:    Temp:    SpO2: 100% 100%    Last Pain:  Vitals:   06/04/17 1600  TempSrc:   PainSc: 0-No pain                 Alysha Doolan P Jazell Rosenau

## 2017-06-04 NOTE — Op Note (Signed)
Operative Note   DATE OF OPERATION: 5.20.19  LOCATION: [facility] [place] OR-observation  SURGICAL DIVISION: Plastic Surgery  PREOPERATIVE DIAGNOSES:  1. History melanoma scalp 2. Non pressure ulcer scalp   POSTOPERATIVE DIAGNOSES:  same  PROCEDURE:  1. Surgical preparation for grafting 50cm2 2. STSG from left thigh to scalp 50 cm2 3. Application A Cell matrix to left thigh 70 cm2  SURGEON: Irene Limbo MD MBA  ASSISTANT: none  ANESTHESIA:  General.   GHW:29 ml  COMPLICATIONS: None immediate.   INDICATIONS FOR PROCEDURE:  The patient, Keith [name], is a 77 y.o. male born on 11-16-1940, is here for coverage scalp wound following WLE melanoma and application Integra. Final pathology with focal positivity deep margin. Patient underwent excision melanoma including periosteum, plan burring periosteum given this finding.   FINDINGS: 8 x 8 cm circular wound scalp 100% granulation tissue.  DESCRIPTION OF PROCEDURE:  The patient's operative site was marked with the patient in the preoperative area. The patient was taken to the operating room. SCDs were placed and IV antibiotics were given. The patient's operative site was prepped and draped in a sterile fashion. A time out was performed and all information was confirmed to be correct. Local anesthetic infiltrated surrounding wound, to perform bilateral supraorbital blocks, and around left thigh donor site. Trudee Kuster used to remove granulation tissue over scalp wound and removal outer table until punctate bleeding bone encountered, total area 50cm2. Split thickness skin graft harvested at 12/1000th inch and meshed in 1:1.5 ratio. This was inset over scalp wound with 4-0 chromic. Adaptic and sterile sponge applied as bolster. Over left thigh, A Cell matrix 70 cm2 prepared and applied to donor site. Adaptic and dry dressing, Kerlix, and Ace wrap applied.  The patient was allowed to wake from anesthesia, extubated and taken to the recovery room in  satisfactory condition.   SPECIMENS: none  DRAINS: none  Irene Limbo, MD [facility] Plastic & Reconstructive Surgery [phone], pin [phone]

## 2017-06-04 NOTE — Anesthesia Procedure Notes (Signed)
Procedure Name: LMA Insertion Date/Time: 06/04/2017 7:33 AM Performed by: Raenette Rover, CRNA Pre-anesthesia Checklist: Patient identified, Emergency Drugs available, Suction available and Patient being monitored Patient Re-evaluated:Patient Re-evaluated prior to induction Oxygen Delivery Method: Circle system utilized Preoxygenation: Pre-oxygenation with 100% oxygen Induction Type: IV induction LMA: LMA inserted LMA Size: 4.0 Number of attempts: 1 Placement Confirmation: positive ETCO2,  CO2 detector and breath sounds checked- equal and bilateral Tube secured with: Tape Dental Injury: Teeth and Oropharynx as per pre-operative assessment

## 2017-06-04 NOTE — Transfer of Care (Signed)
Immediate Anesthesia Transfer of Care Note  Patient: Keith [name]  Procedure(s) Performed: SKIN GRAFT SPLIT THICKNESS TO SCALP FROM RIGHT OR LEFT THIGH POSSIBLE A CELL TO DONOR SITE (N/A Scalp)  Patient Location: PACU  Anesthesia Type:General  Level of Consciousness: drowsy  Airway & Oxygen Therapy: Patient Spontanous Breathing and Patient connected to nasal cannula oxygen  Post-op Assessment: Report given to RN and Post -op Vital signs reviewed and stable  Post vital signs: Reviewed and stable  Last Vitals:  Vitals Value Taken Time  BP 126/38 06/04/2017  8:26 AM  Temp    Pulse 57 06/04/2017  8:30 AM  Resp 9 06/04/2017  8:30 AM  SpO2 100 % 06/04/2017  8:30 AM  Vitals shown include unvalidated device data.  Last Pain:  Vitals:   06/04/17 0622  TempSrc:   PainSc: 3       Patients Stated Pain Goal: 3 (81/84/03 7543)  Complications: No apparent anesthesia complications

## 2017-06-04 NOTE — H&P (Signed)
Subjective:     Patient ID: Keith [name] is a 77 y.o. male.  HPI  6 weeks post WLE scalp and application Integra for melanoma. Parotidectomies, SLN completed by Dr. Constance Holster. Final pathology 1.5 cm depth nodular melanoma focally touches deep margin. , MI 2/mm2, LVI not identified, margins clear. Bilateral superficial parotid and 0/6 LN negative.  Seen by Dr. Alen Blew, PET scan negative for distant disease.  From initial consult: "Patient was admitted to [facility] 11/4/2018with acute CVA. He underwent cerebral angiogram and has found to have an occluded Lt ICA prox with partial distal reconstitution from the ipsilateral OA. He was noted on admission to have scalp mass that had been present for years over 2 years. Son notes trauma to scalp 4-5 years ago had mass that waxed and waned, then for last 2 years mass that has had continuous growth. MRI findings as below. No prior biopsy."     Objective:   Physical Exam  Cardiovascular: Normal rate, regular rhythm and normal heart sounds.   Pulmonary/Chest: Effort normal and breath sounds normal.   HEENT: Integra matrix and dried, partially granulated, no cellulitis, open wound 8 x 7 cm 1-2 mm lagophthalmos right eye Neck and facial incisions healing well, minor epidermolysis left neck incision line    Assessment:     Melanoma scalp s/p excision, application Integra S/p SLN, superficial parotidectomy bilateral    Plan:     Reviewed with patient wound will heal on own but anticipate with take months. Plan STSG to area . Discussed donor site, bolster, pain anticipated, risk failure graft. Discussed that pathology was noted to be focally positive at deep margin. As this included resection op periosteum, plan burring outer table and application STSG.   Plan at least overnight stay. Risks including but not limited to bleeding, pain, need for additional surgery, recurrence, failure graft, DVT/PE, cardiopulmonary complications reviewed.  Irene Limbo, MD [facility] Plastic & Reconstructive Surgery [phone], pin [phone]

## 2017-06-05 ENCOUNTER — Telehealth: Payer: Self-pay | Admitting: Cardiology

## 2017-06-05 ENCOUNTER — Encounter (HOSPITAL_COMMUNITY): Payer: Self-pay | Admitting: Plastic Surgery

## 2017-06-05 NOTE — Telephone Encounter (Signed)
Attempted to call pt b/c his home monitor has not updated in at least 14 days. No answer and unable to leave to a message.

## 2017-06-05 NOTE — Discharge Summary (Signed)
Physician Discharge Summary  Patient ID: Keith [name] MRN: [id number] DOB/AGE: 1940-12-25 77 y.o.  Admit date: 06/04/2017 Discharge date: 06/05/2017  Admission Diagnoses: Melanoma scalp, open wound scalp  Discharge Diagnoses:  Active Problems:   Melanoma of scalp [facility])  Discharged Condition: stable  Hospital Course: Patient did well post operatively with no nausea, tolerating diet and controlled pain. Instructed on wound care.  Treatments: surgery: STSG from left thigh to scalp, application A Cell matrix to left thigh 5.20.19  Discharge Exam: Blood pressure (!) 114/59, pulse 64, temperature 97.6 F (36.4 C), temperature source Oral, resp. rate 16, height 5\' 9"  (1.753 m), weight 53.2 kg (117 lb 4.6 oz), SpO2 95 %. Incision/Wound: scalp bolster and left thigh dressings dry intact  Disposition: Discharge disposition: 01-Home or Self Care       Discharge Instructions    Call MD for:  redness, tenderness, or signs of infection (pain, swelling, bleeding, redness, odor or green/yellow discharge around incision site)   Complete by:  As directed    Call MD for:  temperature >100.5   Complete by:  As directed    Discharge instructions   Complete by:  As directed    Keep scalp and left thigh dressing dry intact until follow up visit. Sponge bathe through follow up visit.  No strenuous exercise, house or yard would through follow up visit.   Resume previous diet   Complete by:  As directed      Allergies as of 06/05/2017      Reactions   Penicillins Other (See Comments)   UNKNOWN REACTION FROM CHILDHOOD  Has patient had a PCN reaction causing immediate rash, facial/tongue/throat swelling, SOB or lightheadedness with hypotension: Unknown Has patient had a PCN reaction causing severe rash involving mucus membranes or skin necrosis: Unknown Has patient had a PCN reaction that required hospitalization: Unknown Has patient had a PCN reaction occurring within the last 10 years:  No If all of the above answers are "NO", then may proceed with Cephalosporin use.      Medication List    TAKE these medications   aspirin EC 325 MG tablet Take 1 tablet (325 mg total) by mouth daily. What changed:  when to take this   atorvastatin 40 MG tablet Commonly known as:  LIPITOR Take 1 tablet (40 mg total) by mouth daily at 6 PM. What changed:  when to take this   COMBIGAN 0.2-0.5 % ophthalmic solution Generic drug:  brimonidine-timolol Apply 1 drop to eye 2 (two) times daily.   HYDROcodone-acetaminophen 5-325 MG tablet Commonly known as:  NORCO/VICODIN Take 1-2 tablets by mouth every 4 (four) hours as needed for moderate pain.      Follow-up Information    Irene Limbo, MD On 06/13/2017.   Specialty:  Plastic Surgery Why:  as scheduled Contact information: [address] [place] [address] [phone]           Signed: Irene Limbo 06/05/2017, 6:34 AM

## 2017-06-05 NOTE — Progress Notes (Signed)
Pt denies pain, dressings to head and left thigh dry and intact. Discharge instructions given. Discharged to home accompanied by a friend.

## 2017-06-08 ENCOUNTER — Ambulatory Visit (HOSPITAL_COMMUNITY)
Admission: RE | Admit: 2017-06-08 | Discharge: 2017-06-08 | Disposition: A | Discharge status: Home / Self Care | Payer: Medicare Other | Source: Ambulatory Visit | Attending: Nurse Practitioner | Admitting: Nurse Practitioner

## 2017-06-08 DIAGNOSIS — I6523 Occlusion and stenosis of bilateral carotid arteries: Secondary | ICD-10-CM | POA: Insufficient documentation

## 2017-06-08 DIAGNOSIS — I6521 Occlusion and stenosis of right carotid artery: Secondary | ICD-10-CM

## 2017-06-08 DIAGNOSIS — Z8673 Personal history of transient ischemic attack (TIA), and cerebral infarction without residual deficits: Secondary | ICD-10-CM | POA: Insufficient documentation

## 2017-06-08 NOTE — Progress Notes (Signed)
VASCULAR LAB PRELIMINARY  PRELIMINARY  PRELIMINARY  PRELIMINARY  Carotid duplex completed.    Preliminary report: 40-59% right ICA stenosis, highest end of scale.  Left ICA is occluded.  Bilateral vertebral artery flow is antegrade.   Yoshino Broccoli, RVT 06/08/2017, 1:34 PM

## 2017-06-11 ENCOUNTER — Other Ambulatory Visit: Payer: Self-pay | Admitting: Physical Medicine & Rehabilitation

## 2017-06-12 ENCOUNTER — Other Ambulatory Visit: Payer: Self-pay | Admitting: *Deleted

## 2017-06-12 ENCOUNTER — Telehealth: Payer: Self-pay | Admitting: *Deleted

## 2017-06-12 MED ORDER — HYDROCODONE-ACETAMINOPHEN 5-325 MG PO TABS: 1.0000 | ORAL_TABLET | ORAL | 0 refills | Status: DC | PRN

## 2017-06-12 NOTE — Telephone Encounter (Signed)
Spoke with patient and informed him that his Carotid doppler study is stable. He verbalized understanding, appreciation of call.

## 2017-06-21 ENCOUNTER — Telehealth: Payer: Self-pay | Admitting: Cardiology

## 2017-06-21 DIAGNOSIS — H2512 Age-related nuclear cataract, left eye: Secondary | ICD-10-CM | POA: Diagnosis not present

## 2017-06-21 DIAGNOSIS — H401124 Primary open-angle glaucoma, left eye, indeterminate stage: Secondary | ICD-10-CM | POA: Diagnosis not present

## 2017-06-21 DIAGNOSIS — H401123 Primary open-angle glaucoma, left eye, severe stage: Secondary | ICD-10-CM | POA: Diagnosis not present

## 2017-06-21 NOTE — Telephone Encounter (Signed)
Spoke w/ pt and requested that he send a manual transmission b/c his home monitor has not updated in at least 14 days.   

## 2017-07-02 DIAGNOSIS — H2511 Age-related nuclear cataract, right eye: Secondary | ICD-10-CM | POA: Diagnosis not present

## 2017-07-04 ENCOUNTER — Telehealth (HOSPITAL_COMMUNITY): Payer: Self-pay

## 2017-07-04 NOTE — Telephone Encounter (Signed)
Pt agreed to f/u in 6 months with us carotid. AW 

## 2017-07-05 DIAGNOSIS — H401113 Primary open-angle glaucoma, right eye, severe stage: Secondary | ICD-10-CM | POA: Diagnosis not present

## 2017-07-05 DIAGNOSIS — H2511 Age-related nuclear cataract, right eye: Secondary | ICD-10-CM | POA: Diagnosis not present

## 2017-07-05 DIAGNOSIS — H401114 Primary open-angle glaucoma, right eye, indeterminate stage: Secondary | ICD-10-CM | POA: Diagnosis not present

## 2017-07-13 ENCOUNTER — Other Ambulatory Visit: Payer: Self-pay | Admitting: Physical Medicine & Rehabilitation

## 2017-07-13 NOTE — Telephone Encounter (Signed)
Rec'd electronic request to refill atorvastatin.  Dr. Letta Pate is not going to continue providing a script for this medication.  Dr. Letta Pate defers to Primary Care or Cardiologist if applicable

## 2017-07-24 ENCOUNTER — Other Ambulatory Visit: Payer: Self-pay | Admitting: Physical Medicine & Rehabilitation

## 2017-07-30 ENCOUNTER — Telehealth: Payer: Self-pay | Admitting: *Deleted

## 2017-07-30 ENCOUNTER — Telehealth: Payer: Self-pay | Admitting: Nurse Practitioner

## 2017-07-30 MED ORDER — ATORVASTATIN CALCIUM 40 MG PO TABS: 40.0000 mg | ORAL_TABLET | Freq: Every day | ORAL | 1 refills | Status: DC

## 2017-07-30 NOTE — Telephone Encounter (Signed)
Patient's son [name] (on [place]) calling. When patient was in the hospital last November Dr. Erlinda Hong put the patient on atorvastatin (LIPITOR) 40 MG tablet. Per patient's son Dr. Dianna Limbo office has been filling this medication but according to that office it was filled in error because he is not a patient there. Can this medication be called to [facility] [address].

## 2017-07-30 NOTE — Telephone Encounter (Signed)
LVM requesting son, [name] call back to discuss Rx request.

## 2017-07-30 NOTE — Telephone Encounter (Signed)
Spoke with son, Richardson Landry on [place]. He stated his father has new PCP at [facility], but his first appointment isn't until 09/03/17. The son stated he is out of Lipitor as of today. He stated he and his father were under the impression Dr Erlinda Hong had ordered Lipitor. This RN advised him that Dr Erlinda Hong instructed him to continue taking Lipitor, but his PCP would manage his cholesterol medications. This RN advised will give him 2 month supply so that he can continue medication until he is seen at [facility]. Advised son at that time he needs to keep appointment with PCP and have new PCP refill Lipitor. Richardson Landry verbalized understanding, appreciation for help.

## 2017-08-01 DIAGNOSIS — D485 Neoplasm of uncertain behavior of skin: Secondary | ICD-10-CM | POA: Diagnosis not present

## 2017-08-01 DIAGNOSIS — D034 Melanoma in situ of scalp and neck: Secondary | ICD-10-CM | POA: Diagnosis not present

## 2017-08-01 DIAGNOSIS — L131 Subcorneal pustular dermatitis: Secondary | ICD-10-CM | POA: Diagnosis not present

## 2017-08-01 DIAGNOSIS — Z85820 Personal history of malignant melanoma of skin: Secondary | ICD-10-CM | POA: Diagnosis not present

## 2017-08-01 DIAGNOSIS — L0889 Other specified local infections of the skin and subcutaneous tissue: Secondary | ICD-10-CM | POA: Diagnosis not present

## 2017-08-09 ENCOUNTER — Telehealth: Payer: Self-pay | Admitting: Cardiology

## 2017-08-09 NOTE — Telephone Encounter (Signed)
Spoke w/ pt and requested that he send a manual transmission b/c his home monitor has not updated in at least 14 days.   

## 2017-08-14 ENCOUNTER — Inpatient Hospital Stay: Payer: Self-pay

## 2017-08-15 ENCOUNTER — Encounter: Payer: Self-pay | Admitting: *Deleted

## 2017-08-15 ENCOUNTER — Telehealth: Payer: Self-pay | Admitting: Oncology

## 2017-08-15 ENCOUNTER — Inpatient Hospital Stay: Payer: Medicare Other | Admitting: Oncology

## 2017-08-15 ENCOUNTER — Telehealth: Payer: Self-pay | Admitting: *Deleted

## 2017-08-15 ENCOUNTER — Inpatient Hospital Stay: Payer: Medicare Other

## 2017-08-15 DIAGNOSIS — Z006 Encounter for examination for normal comparison and control in clinical research program: Secondary | ICD-10-CM

## 2017-08-15 NOTE — Telephone Encounter (Signed)
Patient called to r/s upcoming July appts to aug.

## 2017-08-15 NOTE — Telephone Encounter (Signed)
Left message for patient to complete a manuel transmission on hid loop recorder for the [facility] research study (9 month follow up).

## 2017-08-17 NOTE — Progress Notes (Signed)
STROKE~AF month 9 Carelink transmission completed 2041/07/05

## 2017-08-23 ENCOUNTER — Other Ambulatory Visit: Payer: Self-pay | Admitting: Neurology

## 2017-09-03 ENCOUNTER — Ambulatory Visit: Payer: Medicare Other | Attending: Internal Medicine | Admitting: Internal Medicine

## 2017-09-03 ENCOUNTER — Encounter: Payer: Self-pay | Admitting: Internal Medicine

## 2017-09-03 VITALS — BP 112/62 | HR 58 | Temp 98.3°F | Resp 16 | Ht 69.0 in | Wt 115.8 lb

## 2017-09-03 DIAGNOSIS — Z880 Allergy status to penicillin: Secondary | ICD-10-CM | POA: Diagnosis not present

## 2017-09-03 DIAGNOSIS — Z7982 Long term (current) use of aspirin: Secondary | ICD-10-CM | POA: Diagnosis not present

## 2017-09-03 DIAGNOSIS — N183 Chronic kidney disease, stage 3 unspecified: Secondary | ICD-10-CM

## 2017-09-03 DIAGNOSIS — E039 Hypothyroidism, unspecified: Secondary | ICD-10-CM | POA: Insufficient documentation

## 2017-09-03 DIAGNOSIS — E785 Hyperlipidemia, unspecified: Secondary | ICD-10-CM | POA: Insufficient documentation

## 2017-09-03 DIAGNOSIS — Z87891 Personal history of nicotine dependence: Secondary | ICD-10-CM | POA: Diagnosis not present

## 2017-09-03 DIAGNOSIS — D224 Melanocytic nevi of scalp and neck: Secondary | ICD-10-CM

## 2017-09-03 DIAGNOSIS — Z2821 Immunization not carried out because of patient refusal: Secondary | ICD-10-CM | POA: Diagnosis not present

## 2017-09-03 DIAGNOSIS — D649 Anemia, unspecified: Secondary | ICD-10-CM | POA: Diagnosis not present

## 2017-09-03 DIAGNOSIS — Z8673 Personal history of transient ischemic attack (TIA), and cerebral infarction without residual deficits: Secondary | ICD-10-CM | POA: Diagnosis not present

## 2017-09-03 DIAGNOSIS — I6523 Occlusion and stenosis of bilateral carotid arteries: Secondary | ICD-10-CM | POA: Diagnosis not present

## 2017-09-03 MED ORDER — ATORVASTATIN CALCIUM 40 MG PO TABS: 40.0000 mg | ORAL_TABLET | Freq: Every day | ORAL | 1 refills | Status: DC

## 2017-09-03 NOTE — Progress Notes (Signed)
Patient ID: Keith [name], male    DOB: October 26, 1940  MRN: [id number]  CC: Hospitalization Follow-up and Establish Care   Subjective: Keith [name] is a 77 y.o. male who presents for new patient visit and hospital follow-up.  Son Keith [name] and his fiance Keith [name] are with him. His concerns today include:  Melanoma scalp, CKD 3, HL, hypothyroid, LT MCA infarct, CAS 45% on the right and completely occluded on the left, diastolic dysf with EF 96%, hiatial hernia  Patient was hospitalized in November of last year with acute left middle cerebral artery infarction and found to have completely occluded left internal carotid artery.  Patient was discharged home on Lipitor and aspirin.  Loop recorder was implanted.  To date there has not been any abnormal rhythms.   He was also found to have a scalp lesion for which he was later seen by dermatology as an outpatient.  This turned out to be melanoma.  Since then he has undergone 4 surgeries on his scalp including a skin graft.  He has also had excision of 2 lymph nodes in the neck.  Recently found to have a new smaller lesion on the scalp for which she will need to have additional surgery.  Since his stroke patient reports that the strength on his right side has significantly improved and so has his speech.  He reports some balance issues but has not had any falls.  He is widowed and lives alone.  He is independent in his ADLs.  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  Since his stroke he has discontinued driving.  He has lost significant weight after undergoing several procedures on his scalp.  He also attributes the weight loss to his dentures being broken and having a difficult time chewing.  He plans to get new dentures.  Son reports that over the past 2 months he is just started to regain some of his weight.  He is gained about 12 pounds in the last 2 months.  His appetite is good.  Patient noted on labs to have chronic  kidney disease.  Patient states he had some problems with his kidneys in the past requiring stent placement back in 2006.  He denies any hematuria.  No problems urinating.  Patient Active Problem List   Diagnosis Date Noted  . Melanoma of scalp ([place]) 04/23/2017  . Cataract of both eyes 02/08/2017  . Carotid occlusion, left 02/08/2017  . Carotid stenosis, asymptomatic, right 02/08/2017  . Hyperlipidemia 02/08/2017  . Hemiparesis affecting right side as late effect of cerebrovascular accident (CVA) ([place]) 11/23/2016  . Gait disturbance, post-stroke 11/23/2016  . Left middle cerebral artery stroke ([place]) 11/23/2016  . SAH (subarachnoid hemorrhage) ([place]) 11/20/2016  . Hypothyroidism 11/20/2016  . Neoplasm of skin of scalp 11/20/2016  . Cerebrovascular accident (CVA) due to occlusion of left carotid artery ([place]) 11/20/2016  . Gait disturbance   . Subarachnoid hemorrhage ([place])   . Dysphagia, post-stroke   . Stage 3 chronic kidney disease ([place])   . Hyperglycemia   . Acute blood loss anemia      Current Outpatient Medications on File Prior to Visit  Medication Sig Dispense Refill  . aspirin EC 325 MG tablet Take 1 tablet (325 mg total) by mouth daily. (Patient taking differently: Take 325 mg by mouth at bedtime. ) 30 tablet 0  . brimonidine-timolol (COMBIGAN) 0.2-0.5 % ophthalmic solution Apply 1 drop to eye 2 (two) times daily.    Marland Kitchen  doxycycline (VIBRAMYCIN) 100 MG capsule Take 100 mg by mouth daily.    Marland Kitchen erythromycin ophthalmic ointment Place 1 application into the right eye once.    . mupirocin ointment (BACTROBAN) 2 % Place 1 application into the nose daily.    Marland Kitchen HYDROcodone-acetaminophen (NORCO/VICODIN) 5-325 MG tablet Take 1-2 tablets by mouth every 4 (four) hours as needed for moderate pain. (Patient not taking: Reported on 09/03/2017) 30 tablet 0   No current facility-administered medications on file prior to visit.     Allergies  Allergen Reactions  . Penicillins Other (See  Comments)    UNKNOWN REACTION FROM CHILDHOOD  Has patient had a PCN reaction causing immediate rash, facial/tongue/throat swelling, SOB or lightheadedness with hypotension: Unknown Has patient had a PCN reaction causing severe rash involving mucus membranes or skin necrosis: Unknown Has patient had a PCN reaction that required hospitalization: Unknown Has patient had a PCN reaction occurring within the last 10 years: No If all of the above answers are "NO", then may proceed with Cephalosporin use.     Social History   Socioeconomic History  . Marital status: Widowed    Spouse name: Not on file  . Number of children: Not on file  . Years of education: Not on file  . Highest education level: Not on file  Occupational History  . Not on file  Social Needs  . Financial resource strain: Not on file  . Food insecurity:    Worry: Not on file    Inability: Not on file  . Transportation needs:    Medical: Not on file    Non-medical: Not on file  Tobacco Use  . Smoking status: Former Smoker    Packs/day: 2.00    Years: 32.00    Pack years: 64.00    Types: Cigarettes    Last attempt to quit: 1983    Years since quitting: 36.6  . Smokeless tobacco: Never Used  Substance and Sexual Activity  . Alcohol use: No  . Drug use: No  . Sexual activity: Not on file  Lifestyle  . Physical activity:    Days per week: Not on file    Minutes per session: Not on file  . Stress: Not on file  Relationships  . Social connections:    Talks on phone: Not on file    Gets together: Not on file    Attends religious service: Not on file    Active member of club or organization: Not on file    Attends meetings of clubs or organizations: Not on file    Relationship status: Not on file  . Intimate partner violence:    Fear of current or ex partner: Not on file    Emotionally abused: Not on file    Physically abused: Not on file    Forced sexual activity: Not on file  Other Topics Concern  . Not  on file  Social History Narrative  . Not on file    Family History  Problem Relation Age of Onset  . Lung cancer Father     Past Surgical History:  Procedure Laterality Date  . APPLICATION OF A-CELL OF HEAD/NECK N/A 04/23/2017   Procedure: APPLICATION OF INTREGRA;  Surgeon: Irene Limbo, MD;  Location: [facility];  Service: Plastics;  Laterality: N/A;  . EXCISION MASS HEAD N/A 03/20/2017   Procedure: EXCISION scalp lesion;  Surgeon: Irene Limbo, MD;  Location: [place];  Service: Plastics;  Laterality: N/A;  EXCISION scalp lesion  . IR  ANGIO INTRA EXTRACRAN SEL COM CAROTID INNOMINATE BILAT MOD SED  11/23/2016  . IR ANGIO VERTEBRAL SEL VERTEBRAL UNI L MOD SED  11/23/2016  . LOOP RECORDER INSERTION N/A 11/23/2016   Procedure: LOOP RECORDER INSERTION;  Surgeon: [name], MD;  Location: [facility];  Service: Cardiovascular;  Laterality: N/A;  . MASS EXCISION N/A 04/23/2017   Procedure: EXCISION MALIGNANT LESION OF SCALP;  Surgeon: Irene Limbo, MD;  Location: [facility];  Service: Plastics;  Laterality: N/A;  . MELANOMA EXCISION  04/23/2017   BILATERAL SUPERFICIAL PAROTIDECTOMY/NECK SENTINEL NODE BIOPSY WITH FROZEN SECTION; LEFT SELECTIVE NECK DISSECTION  . PAROTIDECTOMY Bilateral 04/23/2017   Procedure: BILATERAL SUPERFICIAL PAROTIDECTOMY/NECK SENTINEL NODE BIOPSY WITH FROZEN SECTION; LEFT SELECTIVE NECK DISSECTION;  Surgeon: [name], MD;  Location: [facility];  Service: ENT;  Laterality: Bilateral;  . SKIN GRAFT SPLIT THICKNESS HEAD / NECK  06/04/2017   SKIN GRAFT SPLIT THICKNESS TO SCALP FROM RIGHT OR LEFT THIGH POSSIBLE A CELL TO DONOR SITE  . SKIN SPLIT GRAFT N/A 06/04/2017   Procedure: SKIN GRAFT SPLIT THICKNESS TO SCALP FROM RIGHT OR LEFT THIGH POSSIBLE A CELL TO DONOR SITE;  Surgeon: Irene Limbo, MD;  Location: [facility];  Service: Plastics;  Laterality: N/A;    ROS: Review of Systems  Constitutional: Negative for activity change and appetite change.  HENT: Negative for  congestion and hearing loss.   Eyes:       Had bilateral cataract extraction with lens placement a few months ago  Respiratory: Negative for chest tightness and shortness of breath.   Cardiovascular: Negative for chest pain.  Gastrointestinal: Negative for blood in stool and constipation.  Neurological: Negative for dizziness.  Psychiatric/Behavioral: Negative for dysphoric mood. The patient is not nervous/anxious.     PHYSICAL EXAM: BP 112/62   Pulse (!) 58   Temp 98.3 F (36.8 C) (Oral)   Resp 16   Ht 5\' 9"  (1.753 m)   Wt 115 lb 12.8 oz (52.5 kg)   SpO2 98%   BMI 17.10 kg/m   Physical Exam  General appearance - alert, pleasant elderly Caucasian male in NAD.  He has some temporal wasting. Mental status -patient is oriented.  He answers questions appropriately. Eyes -nonicteric sclera.  Pink conjunctiva. Nose -nostril on the left side is occluded with clear mucus Mouth -patient wearing dentures that appears damaged with some of the teeth missing. Neck -he has some scarring around the neck from previous surgery.  Looks like the parotid glands have been removed. Lymphatics -no cervical axillary lymphadenopathy  chest - clear to auscultation, no wheezes, rales or rhonchi, symmetric air entry Heart - normal rate, regular rhythm, normal S1, S2, no murmurs, rubs, clicks or gallops Abdomen - soft, nontender, nondistended, no masses or organomegaly Neurological -grip is slightly decreased on the right at L4/5.  Otherwise power in upper and lower extremities 5/5 bilaterally.  Gross sensation intact.  Gait is normal Extremities -no lower extremity edema Skin: About 8 cm circular scarred area from skin graft noted on the crown of the head. He has some areas of ecchymosis on his forearms.  Lab Results  Component Value Date   WBC 7.0 05/29/2017   HGB 12.4 (L) 05/29/2017   HCT 40.3 05/29/2017   MCV 94.2 05/29/2017   PLT 282 05/29/2017     Chemistry      Component Value Date/Time    NA 141 05/29/2017 1326   K 4.5 05/29/2017 1326   CL 107 05/29/2017 1326  CO2 25 05/29/2017 1326   BUN 19 05/29/2017 1326   CREATININE 2.24 (H) 05/29/2017 1326      Component Value Date/Time   CALCIUM 9.1 05/29/2017 1326   CALCIUM 7.5 (L) 07/11/2010 0400   ALKPHOS 68 11/23/2016 1854   AST 16 11/23/2016 1854   ALT 9 (L) 11/23/2016 1854   BILITOT 0.6 11/23/2016 1854      ASSESSMENT AND PLAN: 1. History of cardioembolic cerebrovascular accident (CVA) Continue aspirin and Lipitor.  Patient does not smoke. Offered pneumonia and flu vaccines today but patient declined. - CBC - Comprehensive metabolic panel  2. Bilateral carotid artery stenosis Complete occlusion on the left.  Continue Lipitor  3. Hyperlipidemia, unspecified hyperlipidemia type - atorvastatin (LIPITOR) 40 MG tablet; Take 1 tablet (40 mg total) by mouth daily at 6 PM.  Dispense: 90 tablet; Refill: 1  4. Melanocytic nevus of scalp   5. CKD (chronic kidney disease) stage 3, GFR 30-59 ml/min (HCC) - Ambulatory referral to Nephrology - Microalbumin / creatinine urine ratio  6. Influenza vaccination declined   7. 23-polyvalent pneumococcal polysaccharide vaccine declined  Patient was given the opportunity to ask questions.  Patient verbalized understanding of the plan and was able to repeat key elements of the plan.   Orders Placed This Encounter  Procedures  . CBC  . Comprehensive metabolic panel  . Microalbumin / creatinine urine ratio  . Ambulatory referral to Nephrology     Requested Prescriptions   Signed Prescriptions Disp Refills  . atorvastatin (LIPITOR) 40 MG tablet 90 tablet 1    Sig: Take 1 tablet (40 mg total) by mouth daily at 6 PM.    Return in about 3 months (around 12/04/2017).  Karle Plumber, MD, FACP

## 2017-09-04 LAB — COMPREHENSIVE METABOLIC PANEL
ALT: 23 IU/L (ref 0–44)
AST: 24 IU/L (ref 0–40)
Albumin/Globulin Ratio: 1.6 (ref 1.2–2.2)
Albumin: 3.9 g/dL (ref 3.5–4.8)
Alkaline Phosphatase: 89 IU/L (ref 39–117)
BUN/Creatinine Ratio: 10 (ref 10–24)
BUN: 23 mg/dL (ref 8–27)
Bilirubin Total: 0.2 mg/dL (ref 0.0–1.2)
CO2: 22 mmol/L (ref 20–29)
Calcium: 9 mg/dL (ref 8.6–10.2)
Chloride: 107 mmol/L — ABNORMAL HIGH (ref 96–106)
Creatinine, Ser: 2.2 mg/dL — ABNORMAL HIGH (ref 0.76–1.27)
GFR calc Af Amer: 33 mL/min/{1.73_m2} — ABNORMAL LOW (ref >59)
GFR calc non Af Amer: 28 mL/min/{1.73_m2} — ABNORMAL LOW (ref >59)
Globulin, Total: 2.5 g/dL (ref 1.5–4.5)
Glucose: 165 mg/dL — ABNORMAL HIGH (ref 65–99)
Potassium: 4.3 mmol/L (ref 3.5–5.2)
Sodium: 142 mmol/L (ref 134–144)
Total Protein: 6.4 g/dL (ref 6.0–8.5)

## 2017-09-04 LAB — MICROALBUMIN / CREATININE URINE RATIO
Creatinine, Urine: 130 mg/dL
Microalb/Creat Ratio: 9.2 mg/g creat (ref 0.0–30.0)
Microalbumin, Urine: 12 ug/mL

## 2017-09-04 LAB — CBC
Hematocrit: 35.4 % — ABNORMAL LOW (ref 37.5–51.0)
Hemoglobin: 11.4 g/dL — ABNORMAL LOW (ref 13.0–17.7)
MCH: 28.6 pg (ref 26.6–33.0)
MCHC: 32.2 g/dL (ref 31.5–35.7)
MCV: 89 fL (ref 79–97)
Platelets: 191 10*3/uL (ref 150–450)
RBC: 3.98 x10E6/uL — ABNORMAL LOW (ref 4.14–5.80)
RDW: 14.4 % (ref 12.3–15.4)
WBC: 5.9 10*3/uL (ref 3.4–10.8)

## 2017-09-05 ENCOUNTER — Telehealth: Payer: Self-pay

## 2017-09-05 NOTE — Addendum Note (Signed)
Addended by: Karle Plumber B on: 09/05/2017 12:29 PM   Modules accepted: Orders

## 2017-09-05 NOTE — Telephone Encounter (Signed)
Contacted pt to go over lab results pt is aware and doesn't have any questions or concerns 

## 2017-09-08 LAB — SPECIMEN STATUS REPORT

## 2017-09-08 LAB — IRON,TIBC AND FERRITIN PANEL
Ferritin: 103 ng/mL (ref 30–400)
Iron Saturation: 21 % (ref 15–55)
Iron: 50 ug/dL (ref 38–169)
Total Iron Binding Capacity: 241 ug/dL — ABNORMAL LOW (ref 250–450)
UIBC: 191 ug/dL (ref 111–343)

## 2017-09-10 ENCOUNTER — Telehealth: Payer: Self-pay

## 2017-09-10 NOTE — Telephone Encounter (Signed)
Contacted pt to go over lab results pt didn't answer left a detailed vm informing pt of results and  If he has any questions or concerns to give me a call  If pt calls back please give results: iron study revealed that his anemia is most likely due to his chronic kidney disease. No need for iron supplement at this time.

## 2017-09-11 ENCOUNTER — Inpatient Hospital Stay (HOSPITAL_BASED_OUTPATIENT_CLINIC_OR_DEPARTMENT_OTHER): Payer: Medicare Other | Admitting: Oncology

## 2017-09-11 ENCOUNTER — Telehealth: Payer: Self-pay | Admitting: Oncology

## 2017-09-11 ENCOUNTER — Inpatient Hospital Stay: Payer: Medicare Other | Attending: Oncology

## 2017-09-11 VITALS — BP 123/55 | HR 55 | Temp 97.7°F | Resp 17 | Ht 69.0 in | Wt 113.6 lb

## 2017-09-11 DIAGNOSIS — C434 Malignant melanoma of scalp and neck: Secondary | ICD-10-CM | POA: Diagnosis not present

## 2017-09-11 DIAGNOSIS — G518 Other disorders of facial nerve: Secondary | ICD-10-CM | POA: Insufficient documentation

## 2017-09-11 DIAGNOSIS — Z79899 Other long term (current) drug therapy: Secondary | ICD-10-CM

## 2017-09-11 DIAGNOSIS — Z7982 Long term (current) use of aspirin: Secondary | ICD-10-CM | POA: Diagnosis not present

## 2017-09-11 DIAGNOSIS — I700 Atherosclerosis of aorta: Secondary | ICD-10-CM | POA: Insufficient documentation

## 2017-09-11 LAB — CMP (CANCER CENTER ONLY)
ALT: 43 U/L (ref 0–44)
AST: 33 U/L (ref 15–41)
Albumin: 3.7 g/dL (ref 3.5–5.0)
Alkaline Phosphatase: 110 U/L (ref 38–126)
Anion gap: 6 (ref 5–15)
BUN: 22 mg/dL (ref 8–23)
CO2: 28 mmol/L (ref 22–32)
Calcium: 9.6 mg/dL (ref 8.9–10.3)
Chloride: 107 mmol/L (ref 98–111)
Creatinine: 2.21 mg/dL — ABNORMAL HIGH (ref 0.61–1.24)
GFR, Est AFR Am: 32 mL/min — ABNORMAL LOW (ref >60)
GFR, Estimated: 27 mL/min — ABNORMAL LOW (ref >60)
Glucose, Bld: 93 mg/dL (ref 70–99)
Potassium: 4.7 mmol/L (ref 3.5–5.1)
Sodium: 141 mmol/L (ref 135–145)
Total Bilirubin: 0.4 mg/dL (ref 0.3–1.2)
Total Protein: 7.2 g/dL (ref 6.5–8.1)

## 2017-09-11 LAB — CBC WITH DIFFERENTIAL (CANCER CENTER ONLY)
Basophils Absolute: 0 10*3/uL (ref 0.0–0.1)
Basophils Relative: 0 %
Eosinophils Absolute: 0.2 10*3/uL (ref 0.0–0.5)
Eosinophils Relative: 3 %
HCT: 38.9 % (ref 38.4–49.9)
Hemoglobin: 12.4 g/dL — ABNORMAL LOW (ref 13.0–17.1)
Lymphocytes Relative: 26 %
Lymphs Abs: 1.3 10*3/uL (ref 0.9–3.3)
MCH: 29.2 pg (ref 27.2–33.4)
MCHC: 31.9 g/dL — ABNORMAL LOW (ref 32.0–36.0)
MCV: 91.7 fL (ref 79.3–98.0)
Monocytes Absolute: 0.5 10*3/uL (ref 0.1–0.9)
Monocytes Relative: 10 %
Neutro Abs: 3.1 10*3/uL (ref 1.5–6.5)
Neutrophils Relative %: 61 %
Platelet Count: 159 10*3/uL (ref 140–400)
RBC: 4.24 MIL/uL (ref 4.20–5.82)
RDW: 14 % (ref 11.0–14.6)
WBC Count: 5.1 10*3/uL (ref 4.0–10.3)

## 2017-09-11 LAB — LACTATE DEHYDROGENASE: LDH: 138 U/L (ref 98–192)

## 2017-09-11 NOTE — Progress Notes (Signed)
Hematology and Oncology Follow Up Visit  Keith [name] [id number] 1940-10-15 77 y.o. 09/11/2017 9:59 AM Keith [name], MDThimmappa, Keith Hooker, MD   Principle Diagnosis: 77 year old man with a scalp melanoma diagnosed in April 2019.  He was found to have stage IIC with 1.5 mm and 0 out of 6 lymph nodes involved.   Prior Therapy:   He is status post excision of a scalp lesion and sentinel lymph node biopsy and a selective left neck dissection completed in April 2019.  Current therapy: Active surveillance.  Interim History: [name] presents today for a follow-up visit.  Since her last visit, he reports no major changes in his health.  He had another skin graft for his scalp melanoma completed on Jun 04, 2017.  He continues to recover reasonably well and improve his overall performance status and activity level.  He is eating reasonably well although his weight still fluctuates.  He does have some facial neuropathy related to his lymph node dissection.  He denies any constitutional symptoms or excessive fatigue.  He continues to follow with Dr. Ronnald Ramp from [facility] dermatology and has a new skin lesion to be removed in the near future.   He does not report any headaches, blurry vision, syncope or seizures. Does not report any fevers, chills or sweats.  Does not report any cough, wheezing or hemoptysis.  Does not report any chest pain, palpitation, orthopnea or leg edema.  Does not report any nausea, vomiting or abdominal pain.  Does not report any constipation or diarrhea.  Does not report any skeletal complaints.    Does not report frequency, urgency or hematuria.  Does not report any skin rashes or lesions.  Does not report any lymphadenopathy or petechiae.  .  Remaining review of systems is negative.    Medications: I have reviewed the patient's current medications.  Current Outpatient Medications  Medication Sig Dispense Refill  . aspirin EC 325 MG tablet Take 1 tablet (325 mg total)  by mouth daily. (Patient taking differently: Take 325 mg by mouth at bedtime. ) 30 tablet 0  . atorvastatin (LIPITOR) 40 MG tablet Take 1 tablet (40 mg total) by mouth daily at 6 PM. 90 tablet 1  . brimonidine-timolol (COMBIGAN) 0.2-0.5 % ophthalmic solution Apply 1 drop to eye 2 (two) times daily.    Marland Kitchen doxycycline (VIBRAMYCIN) 100 MG capsule Take 100 mg by mouth daily.    Marland Kitchen erythromycin ophthalmic ointment Place 1 application into the right eye once.    Marland Kitchen HYDROcodone-acetaminophen (NORCO/VICODIN) 5-325 MG tablet Take 1-2 tablets by mouth every 4 (four) hours as needed for moderate pain. (Patient not taking: Reported on 09/03/2017) 30 tablet 0  . mupirocin ointment (BACTROBAN) 2 % Place 1 application into the nose daily.     No current facility-administered medications for this visit.      Allergies:  Allergies  Allergen Reactions  . Penicillins Other (See Comments)    UNKNOWN REACTION FROM CHILDHOOD  Has patient had a PCN reaction causing immediate rash, facial/tongue/throat swelling, SOB or lightheadedness with hypotension: Unknown Has patient had a PCN reaction causing severe rash involving mucus membranes or skin necrosis: Unknown Has patient had a PCN reaction that required hospitalization: Unknown Has patient had a PCN reaction occurring within the last 10 years: No If all of the above answers are "NO", then may proceed with Cephalosporin use.     Past Medical History, Surgical history, Social history, and Family History were reviewed and updated.    Physical  Exam: Blood pressure (!) 123/55, pulse (!) 55, temperature 97.7 F (36.5 C), temperature source Oral, resp. rate 17, height 5\' 9"  (1.753 m), weight 113 lb 9.6 oz (51.5 kg), SpO2 100 %.   ECOG: 1 General appearance: alert and cooperative appeared without distress. Head: Normocephalic, without obvious abnormality Oropharynx: No oral thrush or ulcers. Eyes: No scleral icterus.  Pupils are equal and round reactive to  light. Lymph nodes: Cervical, supraclavicular, and axillary nodes normal. Heart:regular rate and rhythm, S1, S2 normal, no murmur, click, rub or gallop Lung:chest clear, no wheezing, rales, normal symmetric air entry Abdomin: soft, non-tender, without masses or organomegaly. Neurological: No motor, sensory deficits.  Intact deep tendon reflexes. Skin: No rashes or lesions.  Well-healed scalp graft site without erythema or induration. Musculoskeletal: No joint deformity or effusion.     Lab Results: Lab Results  Component Value Date   WBC 5.1 09/11/2017   HGB 12.4 (L) 09/11/2017   HCT 38.9 09/11/2017   MCV 91.7 09/11/2017   PLT 159 09/11/2017     Chemistry      Component Value Date/Time   NA 142 09/03/2017 1558   K 4.3 09/03/2017 1558   CL 107 (H) 09/03/2017 1558   CO2 22 09/03/2017 1558   BUN 23 09/03/2017 1558   CREATININE 2.20 (H) 09/03/2017 1558      Component Value Date/Time   CALCIUM 9.0 09/03/2017 1558   CALCIUM 7.5 (L) 07/11/2010 0400   ALKPHOS 89 09/03/2017 1558   AST 24 09/03/2017 1558   ALT 23 09/03/2017 1558   BILITOT 0.2 09/03/2017 1558      EXAM: NUCLEAR MEDICINE PET WHOLE BODY  TECHNIQUE: 5.8 mCi F-18 FDG was injected intravenously. Full-ring PET imaging was performed from the skull base to thigh after the radiotracer. CT data was obtained and used for attenuation correction and anatomic localization.  Fasting blood glucose: 80 mg/dl  COMPARISON:  None  FINDINGS: Mediastinal blood pool activity: SUV max 2.0  HEAD/NECK: There is a saucer shaped rim of metabolic activity the anterior scalp most consistent with tumor excision site.  Linear activity within the LEFT sternocleidomastoid best on coronal fused data set inferior to the mastoid bone. This linear pattern is favored benign. No hypermetabolic lymph nodes in the neck.  Incidental CT findings: none  CHEST: Diffuse uptake within thyroid gland consists with mild thyroiditis. No  suspicious pulmonary nodules. No hypermetabolic mediastinal or axillary lymph nodes. No new abnormal cutaneous lesions.  Incidental CT findings: Coronary artery calcification and aortic atherosclerotic calcification.  ABDOMEN/PELVIS: No abnormal hypermetabolic activity within the liver, pancreas, adrenal glands, or spleen. No hypermetabolic lymph nodes in the abdomen or pelvis.  The diffuse FDG uptake within the gastric fundus and proximal body is favored physiologic.  Two foci of intense activity associated with the bowel. One in the proximal transverse colon and one in the descending colon. There is no mass lesion associated with these foci of uptake and therefore favored unexplained physiologic activity.  Incidental CT findings: Atherosclerotic calcification of the aorta.  SKELETON: No focal hypermetabolic activity to suggest skeletal metastasis.  Incidental CT findings: none  EXTREMITIES: No abnormal hypermetabolic activity in the lower extremities.  Incidental CT findings: none  IMPRESSION: 1. No evidence of melanoma metastasis on whole-body scan. 2. Intense FDG uptake within the frontal scalp consistent with resection. 3. Uptake within the LEFT sternocleidomastoid muscle is favored physiologic. 4. Diffuse hypermetabolic activity within the fundus and proximal stomach is favored physiologic. 5. Two foci of uptake within colon  favored benign physiologic 6.  Aortic Atherosclerosis (ICD10-I70.0).     Impression and Plan:  77 year old man with:  1.    Stage IIC melanoma of the scalp diagnosed in March 2019.  He is S/P  wide excision of the scalp as well as cervical lymph node dissection completed on April 23, 2017.    PET CT scan obtained for staging purposes in May 2019 showed no evidence of metastatic disease.  The natural course of his disease was reviewed today as well as risk of recurrence was reiterated.  He is not a candidate for high-dose  interferon I have recommended continued observation and surveillance.  Immunotherapy will be  option for him if he develops recurrent disease.  He will have repeat PET scan in November 2019.   2.  Dermatology surveillance: He continues to follow with Dr. Ronnald Ramp.  He has another scalp lesion to be removed in the near future.  3.  Follow-up: Will be in 3 months.  15  minutes was spent with the patient face-to-face today.  More than 50% of time was dedicated to reviewing imaging studies, discussing the natural course of his disease and coordinating his future plan of care.   Zola Button, MD 8/27/20199:59 AM

## 2017-09-11 NOTE — Telephone Encounter (Signed)
Appt scheduled AVS/Calendar printed per 8/27 los °

## 2017-09-28 ENCOUNTER — Encounter: Payer: Self-pay | Admitting: *Deleted

## 2017-09-28 DIAGNOSIS — Z006 Encounter for examination for normal comparison and control in clinical research program: Secondary | ICD-10-CM

## 2017-10-01 DIAGNOSIS — C434 Malignant melanoma of scalp and neck: Secondary | ICD-10-CM | POA: Diagnosis not present

## 2017-10-01 DIAGNOSIS — D034 Melanoma in situ of scalp and neck: Secondary | ICD-10-CM | POA: Diagnosis not present

## 2017-10-01 DIAGNOSIS — L989 Disorder of the skin and subcutaneous tissue, unspecified: Secondary | ICD-10-CM | POA: Diagnosis not present

## 2017-10-02 ENCOUNTER — Other Ambulatory Visit: Payer: Self-pay | Admitting: Internal Medicine

## 2017-10-04 ENCOUNTER — Telehealth: Payer: Self-pay | Admitting: *Deleted

## 2017-10-04 NOTE — Telephone Encounter (Signed)
Patient made aware of new PAF noted on LINQ on 09/23/17, duration 1hr 5min. Patient is agreeable to AF Clinic appointment to discuss [facility] per Dr. Jackalyn Lombard recommendations, but requests that I contact his son or daughter-in-law to schedule.  Briefly reached son, poor connection, call disconnected. Called back, VM full. LMOVM for Trish requesting call back to the [facility].  Spoke with patient again, advised that I was not successful in reaching [place] or [place]. He took down the [facility] number and will ask them to call back tomorrow to setup an appointment.

## 2017-10-04 NOTE — Research (Signed)
STROKE~AF research study: Original ICF for research study was not signed by PI. Signature obtained and a copy of ICF mailed to patient for his records. Patient verbalized understanding

## 2017-10-05 NOTE — Telephone Encounter (Signed)
New AF appointment scheduled by Tor Netters.

## 2017-10-09 ENCOUNTER — Other Ambulatory Visit: Payer: Self-pay | Admitting: Internal Medicine

## 2017-10-10 ENCOUNTER — Ambulatory Visit (HOSPITAL_COMMUNITY)
Admission: RE | Admit: 2017-10-10 | Discharge: 2017-10-10 | Disposition: A | Discharge status: Home / Self Care | Payer: Medicare Other | Source: Ambulatory Visit | Attending: Nurse Practitioner | Admitting: Nurse Practitioner

## 2017-10-10 ENCOUNTER — Encounter (HOSPITAL_COMMUNITY): Payer: Self-pay | Admitting: Nurse Practitioner

## 2017-10-10 VITALS — BP 130/52 | HR 56 | Ht 69.0 in | Wt 116.0 lb

## 2017-10-10 DIAGNOSIS — I480 Paroxysmal atrial fibrillation: Secondary | ICD-10-CM | POA: Diagnosis not present

## 2017-10-10 DIAGNOSIS — I4510 Unspecified right bundle-branch block: Secondary | ICD-10-CM | POA: Diagnosis not present

## 2017-10-10 DIAGNOSIS — Z7901 Long term (current) use of anticoagulants: Secondary | ICD-10-CM | POA: Diagnosis not present

## 2017-10-10 DIAGNOSIS — I4891 Unspecified atrial fibrillation: Secondary | ICD-10-CM | POA: Diagnosis present

## 2017-10-10 DIAGNOSIS — I452 Bifascicular block: Secondary | ICD-10-CM | POA: Diagnosis not present

## 2017-10-10 DIAGNOSIS — Z87891 Personal history of nicotine dependence: Secondary | ICD-10-CM | POA: Insufficient documentation

## 2017-10-10 DIAGNOSIS — N189 Chronic kidney disease, unspecified: Secondary | ICD-10-CM | POA: Diagnosis not present

## 2017-10-10 DIAGNOSIS — Z8673 Personal history of transient ischemic attack (TIA), and cerebral infarction without residual deficits: Secondary | ICD-10-CM | POA: Insufficient documentation

## 2017-10-10 DIAGNOSIS — R001 Bradycardia, unspecified: Secondary | ICD-10-CM | POA: Diagnosis not present

## 2017-10-10 DIAGNOSIS — K219 Gastro-esophageal reflux disease without esophagitis: Secondary | ICD-10-CM | POA: Insufficient documentation

## 2017-10-10 DIAGNOSIS — Z85820 Personal history of malignant melanoma of skin: Secondary | ICD-10-CM | POA: Insufficient documentation

## 2017-10-10 DIAGNOSIS — H409 Unspecified glaucoma: Secondary | ICD-10-CM | POA: Diagnosis not present

## 2017-10-10 DIAGNOSIS — Z880 Allergy status to penicillin: Secondary | ICD-10-CM | POA: Insufficient documentation

## 2017-10-10 DIAGNOSIS — Z79899 Other long term (current) drug therapy: Secondary | ICD-10-CM | POA: Insufficient documentation

## 2017-10-10 DIAGNOSIS — I444 Left anterior fascicular block: Secondary | ICD-10-CM | POA: Insufficient documentation

## 2017-10-10 DIAGNOSIS — E039 Hypothyroidism, unspecified: Secondary | ICD-10-CM | POA: Insufficient documentation

## 2017-10-10 MED ORDER — APIXABAN 2.5 MG PO TABS: 2.5000 mg | ORAL_TABLET | Freq: Two times a day (BID) | ORAL | 0 refills | Status: DC

## 2017-10-10 NOTE — Progress Notes (Signed)
Primary Care Physician: [name], MD Referring Physician: Dr. Marcina Millard Gul is a 77 y.o. male with a h/o prior CVA, CKD, Melanoma of the head s/p Linq implantation 11/2016, that is in the afib clinic for recent on referral of Dr. Rayann Heman for paroxysmal  afib as seen on [facility] to start anticoagulation. Pt has been on a ASA since his prior stroke. No awareness of irregular heart beat.  Today, he denies symptoms of palpitations, chest pain, shortness of breath, orthopnea, PND, lower extremity edema, dizziness, presyncope, syncope, or neurologic sequela. The patient is tolerating medications without difficulties and is otherwise without complaint today.   Past Medical History:  Diagnosis Date  . Cataract    planning surgery  . Chronic kidney disease    "weak Kidneys"  . Complication of anesthesia    patient expressed that he has difficulty post anesthesia with swallowing, chewing, and talking after surgery  . GERD (gastroesophageal reflux disease)    tums prn  . Glaucoma   . History of hiatal hernia   . Hypothyroidism   . Melanoma ([place])    scalp  . Obstructive uropathy   . PONV (postoperative nausea and vomiting) 03/20/2017  . Stroke [facility]) 11/2016   speech and right side at time of stroke 04/20/17- all function return to normal   Past Surgical History:  Procedure Laterality Date  . APPLICATION OF A-CELL OF HEAD/NECK N/A 04/23/2017   Procedure: APPLICATION OF INTREGRA;  Surgeon: Irene Limbo, MD;  Location: [place];  Service: Plastics;  Laterality: N/A;  . CATARACT EXTRACTION    . EXCISION MASS HEAD N/A 03/20/2017   Procedure: EXCISION scalp lesion;  Surgeon: Irene Limbo, MD;  Location: [place];  Service: Plastics;  Laterality: N/A;  EXCISION scalp lesion  . IR ANGIO INTRA EXTRACRAN SEL COM CAROTID INNOMINATE BILAT MOD SED  11/23/2016  . IR ANGIO VERTEBRAL SEL VERTEBRAL UNI L MOD SED  11/23/2016  . LOOP RECORDER INSERTION N/A 11/23/2016   Procedure: LOOP RECORDER  INSERTION;  Surgeon: [name], MD;  Location: [place] [facility];  Service: Cardiovascular;  Laterality: N/A;  . MASS EXCISION N/A 04/23/2017   Procedure: EXCISION MALIGNANT LESION OF SCALP;  Surgeon: Irene Limbo, MD;  Location: [place];  Service: Plastics;  Laterality: N/A;  . MELANOMA EXCISION  04/23/2017   BILATERAL SUPERFICIAL PAROTIDECTOMY/NECK SENTINEL NODE BIOPSY WITH FROZEN SECTION; LEFT SELECTIVE NECK DISSECTION  . PAROTIDECTOMY Bilateral 04/23/2017   Procedure: BILATERAL SUPERFICIAL PAROTIDECTOMY/NECK SENTINEL NODE BIOPSY WITH FROZEN SECTION; LEFT SELECTIVE NECK DISSECTION;  Surgeon: [name], MD;  Location: [place];  Service: ENT;  Laterality: Bilateral;  . SKIN GRAFT SPLIT THICKNESS HEAD / NECK  06/04/2017   SKIN GRAFT SPLIT THICKNESS TO SCALP FROM RIGHT OR LEFT THIGH POSSIBLE A CELL TO DONOR SITE  . SKIN SPLIT GRAFT N/A 06/04/2017   Procedure: SKIN GRAFT SPLIT THICKNESS TO SCALP FROM RIGHT OR LEFT THIGH POSSIBLE A CELL TO DONOR SITE;  Surgeon: Irene Limbo, MD;  Location: [place];  Service: Plastics;  Laterality: N/A;    Current Outpatient Medications  Medication Sig Dispense Refill  . atorvastatin (LIPITOR) 40 MG tablet Take 1 tablet (40 mg total) by mouth daily at 6 PM. 90 tablet 1  . brimonidine-timolol (COMBIGAN) 0.2-0.5 % ophthalmic solution Apply 1 drop to eye 2 (two) times daily.    Marland Kitchen erythromycin ophthalmic ointment Place 1 application into the right eye once.    Marland Kitchen HYDROcodone-acetaminophen (NORCO/VICODIN) 5-325 MG tablet Take 1-2 tablets  by mouth every 4 (four) hours as needed for moderate pain. 30 tablet 0  . mupirocin ointment (BACTROBAN) 2 % Place 1 application into the nose daily.    Marland Kitchen apixaban (ELIQUIS) 2.5 MG TABS tablet Take 1 tablet (2.5 mg total) by mouth 2 (two) times daily. 60 tablet 0   No current facility-administered medications for this encounter.     Allergies  Allergen Reactions  . Penicillins Other (See Comments)    UNKNOWN REACTION FROM  CHILDHOOD  Has patient had a PCN reaction causing immediate rash, facial/tongue/throat swelling, SOB or lightheadedness with hypotension: Unknown Has patient had a PCN reaction causing severe rash involving mucus membranes or skin necrosis: Unknown Has patient had a PCN reaction that required hospitalization: Unknown Has patient had a PCN reaction occurring within the last 10 years: No If all of the above answers are "NO", then may proceed with Cephalosporin use.     Social History   Socioeconomic History  . Marital status: Widowed    Spouse name: Not on file  . Number of children: Not on file  . Years of education: Not on file  . Highest education level: Not on file  Occupational History  . Not on file  Social Needs  . Financial resource strain: Not on file  . Food insecurity:    Worry: Not on file    Inability: Not on file  . Transportation needs:    Medical: Not on file    Non-medical: Not on file  Tobacco Use  . Smoking status: Former Smoker    Packs/day: 2.00    Years: 32.00    Pack years: 64.00    Types: Cigarettes    Last attempt to quit: 1983    Years since quitting: 36.7  . Smokeless tobacco: Never Used  Substance and Sexual Activity  . Alcohol use: No  . Drug use: No  . Sexual activity: Not on file  Lifestyle  . Physical activity:    Days per week: Not on file    Minutes per session: Not on file  . Stress: Not on file  Relationships  . Social connections:    Talks on phone: Not on file    Gets together: Not on file    Attends religious service: Not on file    Active member of club or organization: Not on file    Attends meetings of clubs or organizations: Not on file    Relationship status: Not on file  . Intimate partner violence:    Fear of current or ex partner: Not on file    Emotionally abused: Not on file    Physically abused: Not on file    Forced sexual activity: Not on file  Other Topics Concern  . Not on file  Social History Narrative    . Not on file    Family History  Problem Relation Age of Onset  . Lung cancer Father     ROS- All systems are reviewed and negative except as per the HPI above  Physical Exam: Vitals:   10/10/17 0958  BP: (!) 130/52  Pulse: (!) 56  Weight: 52.6 kg  Height: 5\' 9"  (1.753 m)   Wt Readings from Last 3 Encounters:  10/10/17 52.6 kg  09/11/17 51.5 kg  09/03/17 52.5 kg    Labs: Lab Results  Component Value Date   NA 141 09/11/2017   K 4.7 09/11/2017   CL 107 09/11/2017   CO2 28 09/11/2017   GLUCOSE 93 09/11/2017  BUN 22 09/11/2017   CREATININE 2.21 (H) 09/11/2017   CALCIUM 9.6 09/11/2017   PHOS 3.2 07/11/2010   MG 1.8 06/09/2010   Lab Results  Component Value Date   INR 0.98 11/19/2016   Lab Results  Component Value Date   CHOL 183 11/20/2016   HDL 35 (L) 11/20/2016   LDLCALC 125 (H) 11/20/2016   TRIG 115 11/20/2016     GEN- The patient is well appearing, alert and oriented x 3 today.   Head- normocephalic, atraumatic Eyes-  Sclera clear, conjunctiva pink Ears- hearing intact Oropharynx- clear Neck- supple, no JVP Lymph- no cervical lymphadenopathy Lungs- Clear to ausculation bilaterally, normal work of breathing Heart- Regular rate and rhythm, no murmurs, rubs or gallops, PMI not laterally displaced GI- soft, NT, ND, + BS Extremities- no clubbing, cyanosis, or edema MS- no significant deformity or atrophy Skin- no rash or lesion Psych- euthymic mood, full affect Neuro- strength and sensation are intact  EKG-Sinus brady at 56 bpm, RBBB, LAFB Paceart reviewed afib noted 09/23/17, duration 1 hours 14 mins   Assessment and Plan: 1. New onset paroxysmal afib General education  afib Asymptomatic  No rate controlled needed at this time  Continue linq monitoring   2. CHA2DS2VASc of 4 Denies bleeding history Bleeding precautions discussed  Anticoagulation options discussed Pt wants to start eliquis 2.5 mg bid(weight less than 60 mg, Creatinine  2.1) Will avoid antiinflammatories Stop asa Repeat bmet/cbc when returns in 3-4 weeks   Butch Penny C. Carroll, [facility] [address] [place], [place] [address] [phone]

## 2017-10-10 NOTE — Patient Instructions (Signed)
Stop aspirin ? ?Start Eliquis 2.5mg twice a day ?

## 2017-10-16 DIAGNOSIS — H2513 Age-related nuclear cataract, bilateral: Secondary | ICD-10-CM | POA: Diagnosis not present

## 2017-10-16 DIAGNOSIS — H401134 Primary open-angle glaucoma, bilateral, indeterminate stage: Secondary | ICD-10-CM | POA: Diagnosis not present

## 2017-10-16 DIAGNOSIS — Z961 Presence of intraocular lens: Secondary | ICD-10-CM | POA: Diagnosis not present

## 2017-10-18 DIAGNOSIS — Z85820 Personal history of malignant melanoma of skin: Secondary | ICD-10-CM | POA: Diagnosis not present

## 2017-11-01 DIAGNOSIS — L905 Scar conditions and fibrosis of skin: Secondary | ICD-10-CM | POA: Diagnosis not present

## 2017-11-01 DIAGNOSIS — Z85820 Personal history of malignant melanoma of skin: Secondary | ICD-10-CM | POA: Diagnosis not present

## 2017-11-02 ENCOUNTER — Ambulatory Visit (HOSPITAL_COMMUNITY)
Admission: RE | Admit: 2017-11-02 | Discharge: 2017-11-02 | Disposition: A | Discharge status: Home / Self Care | Payer: Medicare Other | Source: Ambulatory Visit | Attending: Nurse Practitioner | Admitting: Nurse Practitioner

## 2017-11-02 ENCOUNTER — Encounter (HOSPITAL_COMMUNITY): Payer: Self-pay | Admitting: Nurse Practitioner

## 2017-11-02 VITALS — BP 120/54 | HR 59 | Ht 69.0 in | Wt 115.0 lb

## 2017-11-02 DIAGNOSIS — I452 Bifascicular block: Secondary | ICD-10-CM | POA: Diagnosis not present

## 2017-11-02 DIAGNOSIS — Z7901 Long term (current) use of anticoagulants: Secondary | ICD-10-CM | POA: Insufficient documentation

## 2017-11-02 DIAGNOSIS — I444 Left anterior fascicular block: Secondary | ICD-10-CM | POA: Diagnosis not present

## 2017-11-02 DIAGNOSIS — Z85820 Personal history of malignant melanoma of skin: Secondary | ICD-10-CM | POA: Insufficient documentation

## 2017-11-02 DIAGNOSIS — K219 Gastro-esophageal reflux disease without esophagitis: Secondary | ICD-10-CM | POA: Diagnosis not present

## 2017-11-02 DIAGNOSIS — E039 Hypothyroidism, unspecified: Secondary | ICD-10-CM | POA: Insufficient documentation

## 2017-11-02 DIAGNOSIS — I480 Paroxysmal atrial fibrillation: Secondary | ICD-10-CM | POA: Insufficient documentation

## 2017-11-02 DIAGNOSIS — H409 Unspecified glaucoma: Secondary | ICD-10-CM | POA: Insufficient documentation

## 2017-11-02 DIAGNOSIS — Z79899 Other long term (current) drug therapy: Secondary | ICD-10-CM | POA: Diagnosis not present

## 2017-11-02 DIAGNOSIS — Z87891 Personal history of nicotine dependence: Secondary | ICD-10-CM | POA: Insufficient documentation

## 2017-11-02 DIAGNOSIS — Z8673 Personal history of transient ischemic attack (TIA), and cerebral infarction without residual deficits: Secondary | ICD-10-CM | POA: Diagnosis not present

## 2017-11-02 DIAGNOSIS — I4510 Unspecified right bundle-branch block: Secondary | ICD-10-CM | POA: Diagnosis not present

## 2017-11-02 DIAGNOSIS — Z880 Allergy status to penicillin: Secondary | ICD-10-CM | POA: Diagnosis not present

## 2017-11-02 DIAGNOSIS — N189 Chronic kidney disease, unspecified: Secondary | ICD-10-CM | POA: Insufficient documentation

## 2017-11-02 DIAGNOSIS — K449 Diaphragmatic hernia without obstruction or gangrene: Secondary | ICD-10-CM | POA: Diagnosis not present

## 2017-11-02 DIAGNOSIS — R001 Bradycardia, unspecified: Secondary | ICD-10-CM | POA: Diagnosis not present

## 2017-11-02 LAB — CBC
HCT: 39.4 % (ref 39.0–52.0)
Hemoglobin: 11.9 g/dL — ABNORMAL LOW (ref 13.0–17.0)
MCH: 28.3 pg (ref 26.0–34.0)
MCHC: 30.2 g/dL (ref 30.0–36.0)
MCV: 93.8 fL (ref 80.0–100.0)
Platelets: 217 10*3/uL (ref 150–400)
RBC: 4.2 MIL/uL — ABNORMAL LOW (ref 4.22–5.81)
RDW: 13.3 % (ref 11.5–15.5)
WBC: 4.8 10*3/uL (ref 4.0–10.5)
nRBC: 0 % (ref 0.0–0.2)

## 2017-11-02 MED ORDER — APIXABAN 2.5 MG PO TABS: 2.5000 mg | ORAL_TABLET | Freq: Two times a day (BID) | ORAL | 6 refills | Status: DC

## 2017-11-02 NOTE — Progress Notes (Signed)
Primary Care Physician: [name], MD Referring Physician: Dr. Marcina Millard Guzy is a 77 y.o. male with a h/o prior CVA, CKD, Melanoma of the head s/p Linq implantation 11/2016, that is in the afib clinic for recent on referral of Dr. Rayann Heman for paroxysmal  afib as seen on [facility] to start anticoagulation. Pt has been on a ASA since his prior stroke. No awareness of irregular heart beat.  F/u in afib clinic, 10/18, for start of eliquis. Pt feels no different, no bleeding issues. He has  not noted any afib.  Today, he denies symptoms of palpitations, chest pain, shortness of breath, orthopnea, PND, lower extremity edema, dizziness, presyncope, syncope, or neurologic sequela. The patient is tolerating medications without difficulties and is otherwise without complaint today.   Past Medical History:  Diagnosis Date  . Cataract    planning surgery  . Chronic kidney disease    "weak Kidneys"  . Complication of anesthesia    patient expressed that he has difficulty post anesthesia with swallowing, chewing, and talking after surgery  . GERD (gastroesophageal reflux disease)    tums prn  . Glaucoma   . History of hiatal hernia   . Hypothyroidism   . Melanoma ([place])    scalp  . Obstructive uropathy   . PONV (postoperative nausea and vomiting) 03/20/2017  . Stroke [facility]) 11/2016   speech and right side at time of stroke 04/20/17- all function return to normal   Past Surgical History:  Procedure Laterality Date  . APPLICATION OF A-CELL OF HEAD/NECK N/A 04/23/2017   Procedure: APPLICATION OF INTREGRA;  Surgeon: Irene Limbo, MD;  Location: [place];  Service: Plastics;  Laterality: N/A;  . CATARACT EXTRACTION    . EXCISION MASS HEAD N/A 03/20/2017   Procedure: EXCISION scalp lesion;  Surgeon: Irene Limbo, MD;  Location: [place];  Service: Plastics;  Laterality: N/A;  EXCISION scalp lesion  . IR ANGIO INTRA EXTRACRAN SEL COM CAROTID INNOMINATE BILAT MOD SED  11/23/2016  . IR  ANGIO VERTEBRAL SEL VERTEBRAL UNI L MOD SED  11/23/2016  . LOOP RECORDER INSERTION N/A 11/23/2016   Procedure: LOOP RECORDER INSERTION;  Surgeon: [name], MD;  Location: [place] [facility];  Service: Cardiovascular;  Laterality: N/A;  . MASS EXCISION N/A 04/23/2017   Procedure: EXCISION MALIGNANT LESION OF SCALP;  Surgeon: Irene Limbo, MD;  Location: [place];  Service: Plastics;  Laterality: N/A;  . MELANOMA EXCISION  04/23/2017   BILATERAL SUPERFICIAL PAROTIDECTOMY/NECK SENTINEL NODE BIOPSY WITH FROZEN SECTION; LEFT SELECTIVE NECK DISSECTION  . PAROTIDECTOMY Bilateral 04/23/2017   Procedure: BILATERAL SUPERFICIAL PAROTIDECTOMY/NECK SENTINEL NODE BIOPSY WITH FROZEN SECTION; LEFT SELECTIVE NECK DISSECTION;  Surgeon: [name], MD;  Location: [place];  Service: ENT;  Laterality: Bilateral;  . SKIN GRAFT SPLIT THICKNESS HEAD / NECK  06/04/2017   SKIN GRAFT SPLIT THICKNESS TO SCALP FROM RIGHT OR LEFT THIGH POSSIBLE A CELL TO DONOR SITE  . SKIN SPLIT GRAFT N/A 06/04/2017   Procedure: SKIN GRAFT SPLIT THICKNESS TO SCALP FROM RIGHT OR LEFT THIGH POSSIBLE A CELL TO DONOR SITE;  Surgeon: Irene Limbo, MD;  Location: [place];  Service: Plastics;  Laterality: N/A;    Current Outpatient Medications  Medication Sig Dispense Refill  . apixaban (ELIQUIS) 2.5 MG TABS tablet Take 1 tablet (2.5 mg total) by mouth 2 (two) times daily. 60 tablet 6  . atorvastatin (LIPITOR) 40 MG tablet Take 1 tablet (40 mg total) by mouth daily at 6 PM.  90 tablet 1  . brimonidine-timolol (COMBIGAN) 0.2-0.5 % ophthalmic solution Apply 1 drop to eye 2 (two) times daily.    Marland Kitchen erythromycin ophthalmic ointment Place 1 application into the right eye once.    . mupirocin ointment (BACTROBAN) 2 % Place 1 application into the nose daily.     No current facility-administered medications for this encounter.     Allergies  Allergen Reactions  . Penicillins Other (See Comments)    UNKNOWN REACTION FROM CHILDHOOD  Has patient had  a PCN reaction causing immediate rash, facial/tongue/throat swelling, SOB or lightheadedness with hypotension: Unknown Has patient had a PCN reaction causing severe rash involving mucus membranes or skin necrosis: Unknown Has patient had a PCN reaction that required hospitalization: Unknown Has patient had a PCN reaction occurring within the last 10 years: No If all of the above answers are "NO", then may proceed with Cephalosporin use.     Social History   Socioeconomic History  . Marital status: Widowed    Spouse name: Not on file  . Number of children: Not on file  . Years of education: Not on file  . Highest education level: Not on file  Occupational History  . Not on file  Social Needs  . Financial resource strain: Not on file  . Food insecurity:    Worry: Not on file    Inability: Not on file  . Transportation needs:    Medical: Not on file    Non-medical: Not on file  Tobacco Use  . Smoking status: Former Smoker    Packs/day: 2.00    Years: 32.00    Pack years: 64.00    Types: Cigarettes    Last attempt to quit: 1983    Years since quitting: 36.8  . Smokeless tobacco: Never Used  Substance and Sexual Activity  . Alcohol use: No  . Drug use: No  . Sexual activity: Not on file  Lifestyle  . Physical activity:    Days per week: Not on file    Minutes per session: Not on file  . Stress: Not on file  Relationships  . Social connections:    Talks on phone: Not on file    Gets together: Not on file    Attends religious service: Not on file    Active member of club or organization: Not on file    Attends meetings of clubs or organizations: Not on file    Relationship status: Not on file  . Intimate partner violence:    Fear of current or ex partner: Not on file    Emotionally abused: Not on file    Physically abused: Not on file    Forced sexual activity: Not on file  Other Topics Concern  . Not on file  Social History Narrative  . Not on file    Family  History  Problem Relation Age of Onset  . Lung cancer Father     ROS- All systems are reviewed and negative except as per the HPI above  Physical Exam: Vitals:   11/02/17 1030  BP: (!) 120/54  Pulse: (!) 59  Weight: 52.2 kg  Height: 5\' 9"  (1.753 m)   Wt Readings from Last 3 Encounters:  11/02/17 52.2 kg  10/10/17 52.6 kg  09/11/17 51.5 kg    Labs: Lab Results  Component Value Date   NA 141 09/11/2017   K 4.7 09/11/2017   CL 107 09/11/2017   CO2 28 09/11/2017   GLUCOSE 93 09/11/2017  BUN 22 09/11/2017   CREATININE 2.21 (H) 09/11/2017   CALCIUM 9.6 09/11/2017   PHOS 3.2 07/11/2010   MG 1.8 06/09/2010   Lab Results  Component Value Date   INR 0.98 11/19/2016   Lab Results  Component Value Date   CHOL 183 11/20/2016   HDL 35 (L) 11/20/2016   LDLCALC 125 (H) 11/20/2016   TRIG 115 11/20/2016     GEN- The patient is well appearing, alert and oriented x 3 today.   Head- normocephalic, atraumatic Eyes-  Sclera clear, conjunctiva pink Ears- hearing intact Oropharynx- clear Neck- supple, no JVP Lymph- no cervical lymphadenopathy Lungs- Clear to ausculation bilaterally, normal work of breathing Heart- Regular rate and rhythm, no murmurs, rubs or gallops, PMI not laterally displaced GI- soft, NT, ND, + BS Extremities- no clubbing, cyanosis, or edema MS- no significant deformity or atrophy Skin- no rash or lesion Psych- euthymic mood, full affect Neuro- strength and sensation are intact  EKG-Sinus brady at 59 bpm, RBBB, LAFB, pr int 170 ms, qrs int 140 ms, qtc 437 ms Paceart reviewed afib noted 09/23/17, duration 1 hours 14 mins   Assessment and Plan: 1. New onset paroxysmal afib Asymptomatic  No rate controlled needed at this time  Continue linq monitoring   2. CHA2DS2VASc of 4 Denies bleeding history Bleeding precautions discussed  Continue eliquis 2.5 mg bid(weight less than 60 mg, Creatinine 2.1) Will avoid antiinflammatories Off asa Cbc  today  F/u with PCP Afib clinic as needed  Butch Penny C. Gaetan Spieker, [facility] [address] [place], [place] [address] [phone]

## 2017-11-05 DIAGNOSIS — S0100XD Unspecified open wound of scalp, subsequent encounter: Secondary | ICD-10-CM | POA: Diagnosis not present

## 2017-11-06 ENCOUNTER — Encounter (HOSPITAL_COMMUNITY): Payer: Self-pay | Admitting: *Deleted

## 2017-11-06 NOTE — Progress Notes (Signed)
[facility]  PATIENT: Keith [name] DOB: 1940/08/29   REASON FOR VISIT: follow up stroke  HISTORY FROM:patient son and fianc    HISTORY OF PRESENT ILLNESS: Keith [name] a 77 y.o.malenever sees doctors before admitted for right side weakness and slurry speech. CT head showed trace left frontal SAH. MRI left MCA infarct. MRA showed left ICA occlusion. CUS left ICA occlusion but also right ICA 60-79% stenosis. Repeat CT showed resolution of trace SAH. DSA further confirmed left ICA occlusion but right ICA only 45% stenosis. EF 55-60%. LE venous doppler negative. LDL 125 and A1C 5.5. Due to questionable SAH, he was only discharged on ASA 81mg  and lipitor 40 to CIR.   Interval History1/23/19DrErlinda Hong During the interval time, the patient has been doing well. Neuro deficit resolved with therapy. Not checking BP at home today in clinic 110/60. Has lost 20lb but stabilized. At home doing regular exercise with walking and sleeps well. Has not had called for dermatology appointment yet. Also complains of blurry vision would like to see an eye doctor. On STROKE AF trial, so far no afib. UPDATE 4/23/2019[name], 77 year old male returns for follow-up with history of right-sided weakness and slurry speech MRI left MCA infarct.  Carotid ultrasound left ICA occlusion but right ICA 60-70% stenosis.  He is currently on aspirin 325 daily without recurrent stroke or TIA symptoms.  He has minimal bruising and no bleeding in addition he is on Lipitor without myalgias.  He is in the stroke atrial fib trial so far no atrial fibrillation.  He had a large melanoma removed from his scalp and also lymph nodes removed on the left.  He is to follow-up with oncology next month.  He has also seen ophthalmology, needs cataract surgery.  He returns for reevaluation UPDATE 10/23/2019[name], 77 year old male returns for follow-up with history of's right sided weakness slurry speech.   MRI with left MCA infarct.  He is in the atrial fib stroke trial and  atrial fib was noted on 10/10/2017.  He was placed on Eliquis and aspirin was stopped.  Patient is not aware when he has any irregular heart beats or palpitations.  He has not had further stroke or TIA symptoms.  He remains on Lipitor without myalgias.  He has had cataract surgery since last seen.  He had another melanoma removed from his scalp since last seen.  Appetite is good and he is sleeping well.  No new neurologic complaints REVIEW OF SYSTEMS: Full 14 system review of systems performed and notable only for those listed, all others are neg:  Constitutional: neg Cardiovascular: neg Ear/Nose/Throat: neg Skin: neg Eyes: Blurred vision, severe glaucoma Respiratory: neg Gastroitestinal: neg  Hematology/Lymphatic: neg  Endocrine: neg Musculoskeletal:neg Allergy/Immunology: neg Neurological: neg Psychiatric: neg Sleep : neg   ALLERGIES: Allergies  Allergen Reactions  . Penicillins Other (See Comments)    UNKNOWN REACTION FROM CHILDHOOD  Has patient had a PCN reaction causing immediate rash, facial/tongue/throat swelling, SOB or lightheadedness with hypotension: Unknown Has patient had a PCN reaction causing severe rash involving mucus membranes or skin necrosis: Unknown Has patient had a PCN reaction that required hospitalization: Unknown Has patient had a PCN reaction occurring within the last 10 years: No If all of the above answers are "NO", then may proceed with Cephalosporin use.     HOME MEDICATIONS: Outpatient Medications Prior to Visit  Medication Sig Dispense Refill  . apixaban (ELIQUIS) 2.5 MG TABS tablet Take 1 tablet (2.5  mg total) by mouth 2 (two) times daily. 60 tablet 6  . atorvastatin (LIPITOR) 40 MG tablet Take 1 tablet (40 mg total) by mouth daily at 6 PM. 90 tablet 1  . brimonidine-timolol (COMBIGAN) 0.2-0.5 % ophthalmic solution Apply 1 drop to eye 2 (two) times daily.    Marland Kitchen erythromycin  ophthalmic ointment Place 1 application into the right eye once.    . mupirocin ointment (BACTROBAN) 2 % Place 1 application into the nose daily.     No facility-administered medications prior to visit.     PAST MEDICAL HISTORY: Past Medical History:  Diagnosis Date  . Cataract    planning surgery  . Chronic kidney disease    "weak Kidneys"  . Complication of anesthesia    patient expressed that he has difficulty post anesthesia with swallowing, chewing, and talking after surgery  . GERD (gastroesophageal reflux disease)    tums prn  . Glaucoma   . History of hiatal hernia   . Hypothyroidism   . Melanoma ([place])    scalp  . Obstructive uropathy   . PONV (postoperative nausea and vomiting) 03/20/2017  . Stroke ([place]) 11/2016   speech and right side at time of stroke 04/20/17- all function return to normal    PAST SURGICAL HISTORY: Past Surgical History:  Procedure Laterality Date  . APPLICATION OF A-CELL OF HEAD/NECK N/A 04/23/2017   Procedure: APPLICATION OF INTREGRA;  Surgeon: Irene Limbo, MD;  Location: [place];  Service: Plastics;  Laterality: N/A;  . CATARACT EXTRACTION    . EXCISION MASS HEAD N/A 03/20/2017   Procedure: EXCISION scalp lesion;  Surgeon: Irene Limbo, MD;  Location: [place];  Service: Plastics;  Laterality: N/A;  EXCISION scalp lesion  . IR ANGIO INTRA EXTRACRAN SEL COM CAROTID INNOMINATE BILAT MOD SED  11/23/2016  . IR ANGIO VERTEBRAL SEL VERTEBRAL UNI L MOD SED  11/23/2016  . LOOP RECORDER INSERTION N/A 11/23/2016   Procedure: LOOP RECORDER INSERTION;  Surgeon: [name], MD;  Location: [place] [facility];  Service: Cardiovascular;  Laterality: N/A;  . MASS EXCISION N/A 04/23/2017   Procedure: EXCISION MALIGNANT LESION OF SCALP;  Surgeon: Irene Limbo, MD;  Location: [place];  Service: Plastics;  Laterality: N/A;  . MELANOMA EXCISION  04/23/2017   BILATERAL SUPERFICIAL PAROTIDECTOMY/NECK SENTINEL NODE BIOPSY WITH FROZEN SECTION; LEFT SELECTIVE NECK  DISSECTION  . PAROTIDECTOMY Bilateral 04/23/2017   Procedure: BILATERAL SUPERFICIAL PAROTIDECTOMY/NECK SENTINEL NODE BIOPSY WITH FROZEN SECTION; LEFT SELECTIVE NECK DISSECTION;  Surgeon: [name], MD;  Location: [place];  Service: ENT;  Laterality: Bilateral;  . SKIN GRAFT SPLIT THICKNESS HEAD / NECK  06/04/2017   SKIN GRAFT SPLIT THICKNESS TO SCALP FROM RIGHT OR LEFT THIGH POSSIBLE A CELL TO DONOR SITE  . SKIN SPLIT GRAFT N/A 06/04/2017   Procedure: SKIN GRAFT SPLIT THICKNESS TO SCALP FROM RIGHT OR LEFT THIGH POSSIBLE A CELL TO DONOR SITE;  Surgeon: Irene Limbo, MD;  Location: [place];  Service: Plastics;  Laterality: N/A;    FAMILY HISTORY: Family History  Problem Relation Age of Onset  . Lung cancer Father     SOCIAL HISTORY: Social History   Socioeconomic History  . Marital status: Widowed    Spouse name: Not on file  . Number of children: Not on file  . Years of education: Not on file  . Highest education level: Not on file  Occupational History  . Not on file  Social Needs  . Financial resource strain: Not on file  .  Food insecurity:    Worry: Not on file    Inability: Not on file  . Transportation needs:    Medical: Not on file    Non-medical: Not on file  Tobacco Use  . Smoking status: Former Smoker    Packs/day: 2.00    Years: 32.00    Pack years: 64.00    Types: Cigarettes    Last attempt to quit: 1983    Years since quitting: 36.8  . Smokeless tobacco: Never Used  Substance and Sexual Activity  . Alcohol use: No  . Drug use: No  . Sexual activity: Not on file  Lifestyle  . Physical activity:    Days per week: Not on file    Minutes per session: Not on file  . Stress: Not on file  Relationships  . Social connections:    Talks on phone: Not on file    Gets together: Not on file    Attends religious service: Not on file    Active member of club or organization: Not on file    Attends meetings of clubs or organizations: Not on file    Relationship  status: Not on file  . Intimate partner violence:    Fear of current or ex partner: Not on file    Emotionally abused: Not on file    Physically abused: Not on file    Forced sexual activity: Not on file  Other Topics Concern  . Not on file  Social History Narrative  . Not on file     PHYSICAL EXAM  Vitals:   11/07/17 1103  BP: (!) 120/56  Pulse: (!) 57  Weight: 115 lb 12.8 oz (52.5 kg)  Height: 5\' 9"  (1.753 m)   Body mass index is 17.1 kg/m.  Generalized: Well developed, in no acute distress  Head: normocephalic and atraumatic,. Oropharynx benign  Neck: Supple, no carotid bruits  Cardiac: Regular rate rhythm, no murmur  Musculoskeletal: No deformity   Neurological examination   Mentation: Alert oriented to time, place, history taking. Attention span and concentration appropriate. Recent and remote memory intact.  Follows all commands speech and language fluent.   Cranial nerve II-XII: Pupils were equal round reactive to light extraocular movements were full, visual field were full on confrontational test. Facial sensation and strength were normal. hearing was intact to finger rubbing bilaterally. Uvula tongue midline. head turning and shoulder shrug were normal and symmetric.Tongue protrusion into cheek strength was normal. Motor: normal bulk and tone, full strength in the BUE, BLE,  Sensory: normal and symmetric to light touch, pinprick, and  Vibration, in the upper and lower extremities Coordination: finger-nose-finger, heel-to-shin bilaterally, no dysmetria Reflexes: 1+ upper and lower and symmetric, plantar responses were flexor bilaterally. Gait and Station: Rising up from seated position without assistance, normal stance,  moderate stride, good arm swing, smooth turning, able to perform tiptoe, and heel walking without difficulty. Tandem gait is steady.  No assistive device  DIAGNOSTIC DATA (LABS, IMAGING, TESTING) - I reviewed patient records, labs, notes, testing  and imaging myself where available.  Lab Results  Component Value Date   WBC 4.8 11/02/2017   HGB 11.9 (L) 11/02/2017   HCT 39.4 11/02/2017   MCV 93.8 11/02/2017   PLT 217 11/02/2017      Component Value Date/Time   NA 141 09/11/2017 0949   NA 142 09/03/2017 1558   K 4.7 09/11/2017 0949   CL 107 09/11/2017 0949   CO2 28 09/11/2017 0949   GLUCOSE  93 09/11/2017 0949   BUN 22 09/11/2017 0949   BUN 23 09/03/2017 1558   CREATININE 2.21 (H) 09/11/2017 0949   CALCIUM 9.6 09/11/2017 0949   CALCIUM 7.5 (L) 07/11/2010 0400   PROT 7.2 09/11/2017 0949   PROT 6.4 09/03/2017 1558   ALBUMIN 3.7 09/11/2017 0949   ALBUMIN 3.9 09/03/2017 1558   AST 33 09/11/2017 0949   ALT 43 09/11/2017 0949   ALKPHOS 110 09/11/2017 0949   BILITOT 0.4 09/11/2017 0949   GFRNONAA 27 (L) 09/11/2017 0949   GFRAA 32 (L) 09/11/2017 0949   Lab Results  Component Value Date   CHOL 183 11/20/2016   HDL 35 (L) 11/20/2016   LDLCALC 125 (H) 11/20/2016   TRIG 115 11/20/2016   CHOLHDL 5.2 11/20/2016   Lab Results  Component Value Date   HGBA1C 5.5 11/20/2016    Lab Results  Component Value Date   TSH 5.170 (H) 11/20/2016      ASSESSMENT AND PLAN  77 y.o. Caucasian male with PMH of never sees doctors before admitted for right side weakness and slurry speech. CT head showed trace left frontal SAH. MRI left MCA infarct. MRA showed left ICA occlusion. CUS left ICA occlusion but also right ICA 60-79% stenosis. Repeat CT showed resolution of trace SAH. DSA further confirmed left ICA occlusion but right ICA only 45% stenosis. EF 55-60%. LE venous doppler negative. LDL 125 and A1C 5.5. Discharged on ASA 81mg  and lipitor 40 to CIR. Neuro deficit resolved with therapy.  Large melanoma on scalp removed by dermatology.  On STROKE AF trial, atrial fib detected 10/10/2017.  Now on Eliquis.  Patient has not had further stroke or TIA symptoms.  Last carotid Doppler Jun 08, 2017 was stable. The patient is a current patient of  Dr. Erlinda Hong  who has left the practice. This note is sent to the work in doctor.     Plan: Stressed the importance of management of risk factors to prevent further stroke Continue Eliquis for secondary stroke prevention Maintain strict control of hypertension with blood pressure goal below 130/90, today's reading 120/56 Control of diabetes with hemoglobin A1c below 6.5 followed by primary care Cholesterol with LDL cholesterol less than 70, followed by primary care,   continue statin drug Lipitor Exercise by walking, eat healthy diet with whole grains,  fresh fruits and vegetables in between meal snacks  Repeat carotid doppler  May 2019 was stable   - Follow up with your primary care physician for stroke risk factor modification. Recommend maintain blood pressure goal <130/80, diabetes with hemoglobin A1c goal below 7.0% and lipids with LDL cholesterol goal below 70 mg/dL.  Follow up 6 months if continued stable will discharge Dennie Bible, [facility], APRN  [facility] [address] [place], [place] [address] [phone]

## 2017-11-07 ENCOUNTER — Ambulatory Visit (INDEPENDENT_AMBULATORY_CARE_PROVIDER_SITE_OTHER): Payer: Medicare Other | Admitting: Nurse Practitioner

## 2017-11-07 ENCOUNTER — Encounter: Payer: Self-pay | Admitting: Nurse Practitioner

## 2017-11-07 VITALS — BP 120/56 | HR 57 | Ht 69.0 in | Wt 115.8 lb

## 2017-11-07 DIAGNOSIS — I480 Paroxysmal atrial fibrillation: Secondary | ICD-10-CM | POA: Insufficient documentation

## 2017-11-07 DIAGNOSIS — I6522 Occlusion and stenosis of left carotid artery: Secondary | ICD-10-CM | POA: Diagnosis not present

## 2017-11-07 DIAGNOSIS — E785 Hyperlipidemia, unspecified: Secondary | ICD-10-CM | POA: Diagnosis not present

## 2017-11-07 DIAGNOSIS — I63232 Cerebral infarction due to unspecified occlusion or stenosis of left carotid arteries: Secondary | ICD-10-CM | POA: Diagnosis not present

## 2017-11-07 NOTE — Patient Instructions (Addendum)
Stressed the importance of management of risk factors to prevent further stroke Continue Eliquis for secondary stroke prevention Maintain strict control of hypertension with blood pressure goal below 130/90, today's reading 120/56 Control of diabetes with hemoglobin A1c below 6.5 followed by primary care Cholesterol with LDL cholesterol less than 70, followed by primary care,   continue statin drug Lipitor Exercise by walking, eat healthy diet with whole grains,  fresh fruits and vegetables in between meal snacks  Repeat carotid doppler  May 2019 was stable   - Follow up with your primary care physician for stroke risk factor modification. Recommend maintain blood pressure goal <130/80, diabetes with hemoglobin A1c goal below 7.0% and lipids with LDL cholesterol goal below 70 mg/dL.  Follow up 6 months if continued stable will discharge

## 2017-11-07 NOTE — Progress Notes (Signed)
I agree with the above plan 

## 2017-11-20 ENCOUNTER — Inpatient Hospital Stay: Payer: Medicare Other | Attending: Oncology

## 2017-11-20 DIAGNOSIS — C434 Malignant melanoma of scalp and neck: Secondary | ICD-10-CM | POA: Diagnosis not present

## 2017-11-20 LAB — CBC WITH DIFFERENTIAL (CANCER CENTER ONLY)
Abs Immature Granulocytes: 0.02 10*3/uL (ref 0.00–0.07)
Basophils Absolute: 0 10*3/uL (ref 0.0–0.1)
Basophils Relative: 0 %
Eosinophils Absolute: 0.2 10*3/uL (ref 0.0–0.5)
Eosinophils Relative: 5 %
HCT: 39.1 % (ref 39.0–52.0)
Hemoglobin: 12.3 g/dL — ABNORMAL LOW (ref 13.0–17.0)
Immature Granulocytes: 0 %
Lymphocytes Relative: 28 %
Lymphs Abs: 1.3 10*3/uL (ref 0.7–4.0)
MCH: 29.4 pg (ref 26.0–34.0)
MCHC: 31.5 g/dL (ref 30.0–36.0)
MCV: 93.3 fL (ref 80.0–100.0)
Monocytes Absolute: 0.5 10*3/uL (ref 0.1–1.0)
Monocytes Relative: 10 %
Neutro Abs: 2.6 10*3/uL (ref 1.7–7.7)
Neutrophils Relative %: 57 %
Platelet Count: 178 10*3/uL (ref 150–400)
RBC: 4.19 MIL/uL — ABNORMAL LOW (ref 4.22–5.81)
RDW: 13.4 % (ref 11.5–15.5)
WBC Count: 4.7 10*3/uL (ref 4.0–10.5)
nRBC: 0 % (ref 0.0–0.2)

## 2017-11-20 LAB — CMP (CANCER CENTER ONLY)
ALT: 23 U/L (ref 0–44)
AST: 23 U/L (ref 15–41)
Albumin: 3.5 g/dL (ref 3.5–5.0)
Alkaline Phosphatase: 107 U/L (ref 38–126)
Anion gap: 5 (ref 5–15)
BUN: 19 mg/dL (ref 8–23)
CO2: 27 mmol/L (ref 22–32)
Calcium: 9.4 mg/dL (ref 8.9–10.3)
Chloride: 109 mmol/L (ref 98–111)
Creatinine: 2.34 mg/dL — ABNORMAL HIGH (ref 0.61–1.24)
GFR, Est AFR Am: 29 mL/min — ABNORMAL LOW (ref >60)
GFR, Estimated: 25 mL/min — ABNORMAL LOW (ref >60)
Glucose, Bld: 90 mg/dL (ref 70–99)
Potassium: 5 mmol/L (ref 3.5–5.1)
Sodium: 141 mmol/L (ref 135–145)
Total Bilirubin: 0.4 mg/dL (ref 0.3–1.2)
Total Protein: 6.9 g/dL (ref 6.5–8.1)

## 2017-11-22 ENCOUNTER — Inpatient Hospital Stay (HOSPITAL_BASED_OUTPATIENT_CLINIC_OR_DEPARTMENT_OTHER): Payer: Medicare Other | Admitting: Oncology

## 2017-11-22 ENCOUNTER — Telehealth: Payer: Self-pay

## 2017-11-22 VITALS — BP 119/44 | HR 56 | Temp 97.6°F | Resp 17 | Ht 69.0 in | Wt 116.6 lb

## 2017-11-22 DIAGNOSIS — C434 Malignant melanoma of scalp and neck: Secondary | ICD-10-CM

## 2017-11-22 NOTE — Progress Notes (Signed)
Hematology and Oncology Follow Up Visit  Keith [name] [id number] 02-12-1940 77 y.o. 11/22/2017 10:56 AM Keith [name], MDJohnson, Keith Batman, MD   Principle Diagnosis: 77 year old man with stage IIC melanoma of the scalp diagnosed in April 2019.    Prior Therapy:   He is status post excision of a scalp lesion and sentinel lymph node biopsy and a selective left neck dissection completed in April 2019.  Current therapy: Active surveillance.  Interim History: [name] returns today for a repeat evaluation.  Since her last visit, he reports feeling reasonably well without any recent health concerns.  He remains active and attends to activities of daily living.  His exercises daily walks about a mile a day.  He is eating better but does not have any teeth which have hindered his weight gain.  He denies and any skill rashes or lesions.  He denies any constitutional symptoms or decline in his energy or performance status.  He has been started on Eliquis for cardiac reasons without any recent bleeding complications.   He does not report any headaches, blurry vision, syncope or seizures.  He denies any alteration in mental status or confusion.  Does not report any fevers, chills or sweats.  Does not report any cough, wheezing or hemoptysis.  Does not report any chest pain, palpitation, orthopnea or leg edema.  Does not report any nausea, vomiting or abdominal pain.  Does not report any changes in bowel habits. Does not report any arthralgias or myalgias.    Does not report frequency, urgency or hematuria.  Does not report any ecchymosis or petechiae.  Does not report any bleeding or clotting tendency.  He denies any heat or cold intolerance.  Remaining review of systems is negative.    Medications: I have reviewed the patient's current medications.  Current Outpatient Medications  Medication Sig Dispense Refill  . apixaban (ELIQUIS) 2.5 MG TABS tablet Take 1 tablet (2.5 mg total) by mouth 2  (two) times daily. 60 tablet 6  . atorvastatin (LIPITOR) 40 MG tablet Take 1 tablet (40 mg total) by mouth daily at 6 PM. 90 tablet 1  . brimonidine-timolol (COMBIGAN) 0.2-0.5 % ophthalmic solution Apply 1 drop to eye 2 (two) times daily.    Marland Kitchen erythromycin ophthalmic ointment Place 1 application into the right eye once.    . mupirocin ointment (BACTROBAN) 2 % Place 1 application into the nose daily.     No current facility-administered medications for this visit.      Allergies:  Allergies  Allergen Reactions  . Penicillins Other (See Comments)    UNKNOWN REACTION FROM CHILDHOOD  Has patient had a PCN reaction causing immediate rash, facial/tongue/throat swelling, SOB or lightheadedness with hypotension: Unknown Has patient had a PCN reaction causing severe rash involving mucus membranes or skin necrosis: Unknown Has patient had a PCN reaction that required hospitalization: Unknown Has patient had a PCN reaction occurring within the last 10 years: No If all of the above answers are "NO", then may proceed with Cephalosporin use.     Past Medical History, Surgical history, Social history, and Family History were reviewed and updated.    Physical Exam:  Blood pressure (!) 119/44, pulse (!) 56, temperature 97.6 F (36.4 C), temperature source Oral, resp. rate 17, height 5\' 9"  (1.753 m), weight 116 lb 9.6 oz (52.9 kg), SpO2 100 %.   ECOG: 1   General appearance: Alert, awake without any distress. Head: Atraumatic without abnormalities Oropharynx: Without any thrush or ulcers.  Eyes: No scleral icterus. Lymph nodes: No lymphadenopathy noted in the cervical, supraclavicular, or axillary nodes Heart:regular rate and rhythm, without any murmurs or gallops.   Lung: Clear to auscultation without any rhonchi, wheezes or dullness to percussion. Abdomin: Soft, nontender without any shifting dullness or ascites. Musculoskeletal: No clubbing or cyanosis. Neurological: No motor or sensory  deficits. Skin: Well-healed scalp lesion noted.      Lab Results: Lab Results  Component Value Date   WBC 4.7 11/20/2017   HGB 12.3 (L) 11/20/2017   HCT 39.1 11/20/2017   MCV 93.3 11/20/2017   PLT 178 11/20/2017     Chemistry      Component Value Date/Time   NA 141 11/20/2017 0922   NA 142 09/03/2017 1558   K 5.0 11/20/2017 0922   CL 109 11/20/2017 0922   CO2 27 11/20/2017 0922   BUN 19 11/20/2017 0922   BUN 23 09/03/2017 1558   CREATININE 2.34 (H) 11/20/2017 0922      Component Value Date/Time   CALCIUM 9.4 11/20/2017 0922   CALCIUM 7.5 (L) 07/11/2010 0400   ALKPHOS 107 11/20/2017 0922   AST 23 11/20/2017 0922   ALT 23 11/20/2017 0922   BILITOT 0.4 11/20/2017 0922       IMPRESSION: 1. No evidence of melanoma metastasis on whole-body scan. 2. Intense FDG uptake within the frontal scalp consistent with resection. 3. Uptake within the LEFT sternocleidomastoid muscle is favored physiologic. 4. Diffuse hypermetabolic activity within the fundus and proximal stomach is favored physiologic. 5. Two foci of uptake within colon favored benign physiologic 6.  Aortic Atherosclerosis (ICD10-I70.0).     Impression and Plan:  77 year old man with:  1.    Melanoma of the scalp diagnosed in March 2019.  He was found to have stage IIC without any evidence of metastatic disease.  He remains on active surveillance at this time.  The natural course of his disease was reviewed today and risk of relapse was assessed.  He is scheduled to have repeat imaging studies next week which has been delayed from this week.  His laboratory data and physical exam does not suggest any disease relapse at this time.  Systemic therapy may be needed he develops measurable disease.  He is agreeable to proceed at this time will obtain imaging studies in 6 months if his current scan is within normal range.  2.  Dermatology surveillance: He continues to follow with Dr. Ronnald Ramp without any recent  abnormalities in his skin.  I continue to recommend continuing this approach.  3.  Follow-up: Will be in 6 months for repeat imaging studies.  15  minutes was spent with the patient face-to-face today.  More than 50% of time was dedicated to discussing his disease status, treatment options and coordinating plan of care.    Zola Button, MD 11/7/201910:56 AM

## 2017-11-22 NOTE — Telephone Encounter (Signed)
Printed avs and calender of upcoming appointment. Per 11/7 los 

## 2017-11-22 NOTE — Telephone Encounter (Signed)
Spoke w/ pt and requested that he send a manual transmission b/c his home monitor has not updated in at least 14 days.   

## 2017-11-27 ENCOUNTER — Encounter (HOSPITAL_COMMUNITY)
Admission: RE | Admit: 2017-11-27 | Discharge: 2017-11-27 | Disposition: A | Discharge status: Home / Self Care | Payer: Medicare Other | Source: Ambulatory Visit | Attending: Oncology | Admitting: Oncology

## 2017-11-27 DIAGNOSIS — C434 Malignant melanoma of scalp and neck: Secondary | ICD-10-CM | POA: Diagnosis not present

## 2017-11-27 LAB — GLUCOSE, CAPILLARY: Glucose-Capillary: 75 mg/dL (ref 70–99)

## 2017-11-27 MED ORDER — FLUDEOXYGLUCOSE F - 18 (FDG) INJECTION
5.7800 | Freq: Once | INTRAVENOUS | Status: AC | PRN
  Administered 2017-11-27: 5.78 via INTRAVENOUS

## 2017-11-28 ENCOUNTER — Telehealth: Payer: Self-pay

## 2017-11-28 NOTE — Telephone Encounter (Signed)
-----   Message from [name], MD sent at 11/27/2017  4:06 PM EST ----- Please let him know his scan shows no cancer.

## 2017-11-28 NOTE — Telephone Encounter (Signed)
Contacted patient and made aware of the results.

## 2017-12-04 ENCOUNTER — Ambulatory Visit: Payer: Self-pay | Admitting: Internal Medicine

## 2017-12-10 ENCOUNTER — Ambulatory Visit (INDEPENDENT_AMBULATORY_CARE_PROVIDER_SITE_OTHER): Payer: Medicare Other | Admitting: Family Medicine

## 2017-12-10 ENCOUNTER — Encounter: Payer: Self-pay | Admitting: Family Medicine

## 2017-12-10 ENCOUNTER — Other Ambulatory Visit: Payer: Self-pay

## 2017-12-10 VITALS — BP 80/50 | HR 69 | Temp 97.4°F | Ht 69.0 in | Wt 117.0 lb

## 2017-12-10 DIAGNOSIS — N184 Chronic kidney disease, stage 4 (severe): Secondary | ICD-10-CM | POA: Diagnosis not present

## 2017-12-10 DIAGNOSIS — Z7689 Persons encountering health services in other specified circumstances: Secondary | ICD-10-CM

## 2017-12-10 DIAGNOSIS — Z972 Presence of dental prosthetic device (complete) (partial): Secondary | ICD-10-CM | POA: Diagnosis not present

## 2017-12-10 DIAGNOSIS — Z23 Encounter for immunization: Secondary | ICD-10-CM

## 2017-12-10 DIAGNOSIS — K0889 Other specified disorders of teeth and supporting structures: Secondary | ICD-10-CM

## 2017-12-10 DIAGNOSIS — I959 Hypotension, unspecified: Secondary | ICD-10-CM | POA: Diagnosis not present

## 2017-12-10 DIAGNOSIS — H524 Presbyopia: Secondary | ICD-10-CM

## 2017-12-10 DIAGNOSIS — I951 Orthostatic hypotension: Secondary | ICD-10-CM | POA: Insufficient documentation

## 2017-12-10 DIAGNOSIS — I6522 Occlusion and stenosis of left carotid artery: Secondary | ICD-10-CM | POA: Diagnosis not present

## 2017-12-10 MED ORDER — MEDICAL COMPRESSION STOCKINGS MISC: 1.0000 | Freq: Every day | 0 refills | Status: DC

## 2017-12-10 NOTE — Assessment & Plan Note (Signed)
History of CKD 4.  Stable.  Recommend follow-up with renal as patient has not seen them in several years.  Referral placed today.

## 2017-12-10 NOTE — Progress Notes (Signed)
New Patient Office Visit  Subjective:  Patient ID: Keith [name], male    DOB: [birth year]  Age: 76 y.o. MRN: [id number]  CC:  Chief Complaint  Patient presents with  . New Patient (Initial Visit)    HPI Keith [name] presents for new pt establish care.   Patient was incidentally found to be hypertensive.  This was confirmed on recheck.  Past Medical History:  Diagnosis Date  . Cataract    planning surgery  . Chronic kidney disease    "weak Kidneys"  . Complication of anesthesia    patient expressed that he has difficulty post anesthesia with swallowing, chewing, and talking after surgery  . GERD (gastroesophageal reflux disease)    tums prn  . Glaucoma   . History of hiatal hernia   . Hypothyroidism   . Melanoma ([place])    scalp  . Melanoma of scalp ([place]) 04/23/2017  . Obstructive uropathy   . PONV (postoperative nausea and vomiting) 03/20/2017  . Stroke [facility]) 11/2016   speech and right side at time of stroke 04/20/17- all function return to normal    Past Surgical History:  Procedure Laterality Date  . APPLICATION OF A-CELL OF HEAD/NECK N/A 04/23/2017   Procedure: APPLICATION OF INTREGRA;  Surgeon: Irene Limbo, MD;  Location: [place];  Service: Plastics;  Laterality: N/A;  . CATARACT EXTRACTION    . EXCISION MASS HEAD N/A 03/20/2017   Procedure: EXCISION scalp lesion;  Surgeon: Irene Limbo, MD;  Location: [place];  Service: Plastics;  Laterality: N/A;  EXCISION scalp lesion  . IR ANGIO INTRA EXTRACRAN SEL COM CAROTID INNOMINATE BILAT MOD SED  11/23/2016  . IR ANGIO VERTEBRAL SEL VERTEBRAL UNI L MOD SED  11/23/2016  . LOOP RECORDER INSERTION N/A 11/23/2016   Procedure: LOOP RECORDER INSERTION;  Surgeon: Tawsha Terrero Grayer, MD;  Location: [place] [facility];  Service: Cardiovascular;  Laterality: N/A;  . MASS EXCISION N/A 04/23/2017   Procedure: EXCISION MALIGNANT LESION OF SCALP;  Surgeon: Irene Limbo, MD;  Location: [place];  Service: Plastics;  Laterality: N/A;  .  MELANOMA EXCISION  04/23/2017   BILATERAL SUPERFICIAL PAROTIDECTOMY/NECK SENTINEL NODE BIOPSY WITH FROZEN SECTION; LEFT SELECTIVE NECK DISSECTION  . PAROTIDECTOMY Bilateral 04/23/2017   Procedure: BILATERAL SUPERFICIAL PAROTIDECTOMY/NECK SENTINEL NODE BIOPSY WITH FROZEN SECTION; LEFT SELECTIVE NECK DISSECTION;  Surgeon: [name], MD;  Location: [place];  Service: ENT;  Laterality: Bilateral;  . SKIN GRAFT SPLIT THICKNESS HEAD / NECK  06/04/2017   SKIN GRAFT SPLIT THICKNESS TO SCALP FROM RIGHT OR LEFT THIGH POSSIBLE A CELL TO DONOR SITE  . SKIN SPLIT GRAFT N/A 06/04/2017   Procedure: SKIN GRAFT SPLIT THICKNESS TO SCALP FROM RIGHT OR LEFT THIGH POSSIBLE A CELL TO DONOR SITE;  Surgeon: Irene Limbo, MD;  Location: [place];  Service: Plastics;  Laterality: N/A;    Family History  Problem Relation Age of Onset  . Lung cancer Father     Social History   Socioeconomic History  . Marital status: Widowed    Spouse name: Not on file  . Number of children: Not on file  . Years of education: Not on file  . Highest education level: Not on file  Occupational History  . Not on file  Social Needs  . Financial resource strain: Not on file  . Food insecurity:    Worry: Not on file    Inability: Not on file  . Transportation needs:    Medical: Not on file  Non-medical: Not on file  Tobacco Use  . Smoking status: Former Smoker    Packs/day: 2.00    Years: 32.00    Pack years: 64.00    Types: Cigarettes    Last attempt to quit: 1983    Years since quitting: 36.9  . Smokeless tobacco: Never Used  Substance and Sexual Activity  . Alcohol use: No  . Drug use: No  . Sexual activity: Not on file  Lifestyle  . Physical activity:    Days per week: Not on file    Minutes per session: Not on file  . Stress: Not on file  Relationships  . Social connections:    Talks on phone: Not on file    Gets together: Not on file    Attends religious service: Not on file    Active member of club or  organization: Not on file    Attends meetings of clubs or organizations: Not on file    Relationship status: Not on file  . Intimate partner violence:    Fear of current or ex partner: Not on file    Emotionally abused: Not on file    Physically abused: Not on file    Forced sexual activity: Not on file  Other Topics Concern  . Not on file  Social History Narrative  . Not on file    ROS Review of Systems  Respiratory: Negative for shortness of breath.   Cardiovascular: Negative for chest pain.  Musculoskeletal:       Lower extremity swelling  Neurological: Negative for dizziness.  All other systems reviewed and are negative.   Objective:   Today's Vitals: BP (!) 80/50   Pulse 69   Temp (!) 97.4 F (36.3 C) (Oral)   Ht 5\' 9"  (1.753 m)   Wt 117 lb (53.1 kg)   SpO2 98%   BMI 17.28 kg/m   Physical Exam  Constitutional: He is oriented to person, place, and time. No distress.  Pleasant, thin, elderly gentleman  HENT:  Head: Normocephalic and atraumatic.  Eyes: Conjunctivae are normal. No scleral icterus.  Neck: No JVD present.  Cardiovascular: Normal rate and regular rhythm.  Pulmonary/Chest: Effort normal and breath sounds normal.  Abdominal: Soft. He exhibits no distension.  Musculoskeletal: Normal range of motion. He exhibits no edema.  Neurological: He is alert and oriented to person, place, and time.  Partial left-sided paralysis of the face  Skin: Skin is warm and dry.  Psychiatric: He has a normal mood and affect. His behavior is normal.    Assessment & Plan:  Pleasant gentleman here to establish primary care.  Followed by multiple specialties, see problem list for further review. Problem List Items Addressed This Visit      Cardiovascular and Mediastinum   Hypotension    Asymptomatic hypertension.  No signs or symptoms of systemic illness.  Patient has not syncopal, not had any recent falls.  Patient recently had damage to dentures and is not been able to  eat as much.  This is likely leading to decreased sodium intake and perhaps poor fluid retention.  Patient does not have any evidence of edema leading to third spacing such as from heart failure. -Referral to dentistry for pair of dentures -Increase p.o. salt load and fluid intake -Compression stockings -Follow-up in 1 month to reassess BP      Relevant Medications   Elastic Bandages & Supports (MEDICAL COMPRESSION STOCKINGS) MISC     Genitourinary   CKD (chronic kidney disease)  stage 4, GFR 15-29 ml/min (HCC)    History of CKD 4.  Stable.  Recommend follow-up with renal as patient has not seen them in several years.  Referral placed today.      Relevant Orders   Ambulatory referral to Nephrology     Other   Dentures complicating chewing   Relevant Orders   Ambulatory referral to Dentistry    Other Visit Diagnoses    Encounter to establish care with new doctor    -  Primary   Need for immunization against influenza       Relevant Orders   Flu Vaccine QUAD 36+ mos IM (Completed)   Presbyopia          Outpatient Encounter Medications as of 12/10/2017  Medication Sig  . apixaban (ELIQUIS) 2.5 MG TABS tablet Take 1 tablet (2.5 mg total) by mouth 2 (two) times daily.  Marland Kitchen atorvastatin (LIPITOR) 40 MG tablet Take 1 tablet (40 mg total) by mouth daily at 6 PM.  . brimonidine-timolol (COMBIGAN) 0.2-0.5 % ophthalmic solution Apply 1 drop to eye 2 (two) times daily.  Regino Schultze Bandages & Supports (MEDICAL COMPRESSION STOCKINGS) MISC 1 each by Does not apply route daily.  Marland Kitchen erythromycin ophthalmic ointment Place 1 application into the right eye once.  . mupirocin ointment (BACTROBAN) 2 % Place 1 application into the nose daily.   No facility-administered encounter medications on file as of 12/10/2017.     Follow-up: Return in about 4 weeks (around 01/07/2018) for BP.   Bonnita Hollow, MD

## 2017-12-10 NOTE — Patient Instructions (Signed)
It was a pleasure to see you today! Thank you for choosing [facility] for your primary care. Shaheen A Schmoker was seen for to establish care.   For your low blood pressure, he may increase her salt intake and wear the compression stockings.  Referrals been placed for you for - dentistry to get her dentures worked on -Kidney doctor -Geriatrics, you can schedule this at the front -Medicare visit, you can schedule this at the front    Best,  Marny Lowenstein, MD, [place] - PGY2 12/10/2017 10:34 AM

## 2017-12-10 NOTE — Assessment & Plan Note (Signed)
Asymptomatic hypertension.  No signs or symptoms of systemic illness.  Patient has not syncopal, not had any recent falls.  Patient recently had damage to dentures and is not been able to eat as much.  This is likely leading to decreased sodium intake and perhaps poor fluid retention.  Patient does not have any evidence of edema leading to third spacing such as from heart failure. -Referral to dentistry for pair of dentures -Increase p.o. salt load and fluid intake -Compression stockings -Follow-up in 1 month to reassess BP

## 2017-12-10 NOTE — Assessment & Plan Note (Signed)
>>  ASSESSMENT AND PLAN FOR HYPOTENSION WRITTEN ON 12/10/2017 10:58 AM BY Catheryn Cluck, MD  Asymptomatic hypertension.  No signs or symptoms of systemic illness.  Patient has not syncopal, not had any recent falls.  Patient recently had damage to dentures and is not been able to eat as much.  This is likely leading to decreased sodium intake and perhaps poor fluid retention.  Patient does not have any evidence of edema leading to third spacing such as from heart failure. -Referral to dentistry for pair of dentures -Increase p.o. salt load and fluid intake -Compression stockings -Follow-up in 1 month to reassess BP

## 2017-12-17 DIAGNOSIS — S0100XD Unspecified open wound of scalp, subsequent encounter: Secondary | ICD-10-CM | POA: Diagnosis not present

## 2017-12-19 ENCOUNTER — Encounter: Payer: Medicare Other | Admitting: *Deleted

## 2017-12-19 VITALS — BP 136/41 | HR 48

## 2017-12-19 DIAGNOSIS — Z006 Encounter for examination for normal comparison and control in clinical research program: Secondary | ICD-10-CM

## 2017-12-19 NOTE — Research (Addendum)
STROKE~AF Research study month 12 follow up completed. Patient signed new updated ICF IRB dated 03/27/17. Patient given a copy of the signed ICF for his records. Patient was diagnosed with AF and currently on Eliquis. Loop interrogated. A copy of his original ICF with Signatures was given to patient for his records.  UPDATE: Copy of ICF (with PI signature) was mailed to patient on 06/jan/2020.

## 2017-12-21 ENCOUNTER — Telehealth: Payer: Self-pay | Admitting: *Deleted

## 2017-12-21 NOTE — Telephone Encounter (Signed)
Talked with patient about completing a manuel transmission for the [facility] research study

## 2018-01-17 ENCOUNTER — Ambulatory Visit: Payer: Medicare Other

## 2018-01-17 VITALS — BP 124/60 | HR 66 | Wt 115.4 lb

## 2018-01-17 DIAGNOSIS — Z0130 Encounter for examination of blood pressure without abnormal findings: Secondary | ICD-10-CM

## 2018-01-17 NOTE — Progress Notes (Signed)
   Patient to nurse clinic for BP check. Weight is 115.4lb. Has not seen dentist yet but received list in mail and will call for appt.  BP 124/60, pulse 66. Has BP cuff at home now, states this is approximately what it has been at home.   Has AWV next week and geriatric clinic. Has appt set up with nephrology.  Danley Danker, RN [facility] RN)

## 2018-01-23 ENCOUNTER — Ambulatory Visit (INDEPENDENT_AMBULATORY_CARE_PROVIDER_SITE_OTHER): Payer: Medicare Other

## 2018-01-23 VITALS — BP 136/54 | HR 50 | Temp 97.8°F | Ht 69.0 in | Wt 118.2 lb

## 2018-01-23 DIAGNOSIS — Z0000 Encounter for general adult medical examination without abnormal findings: Secondary | ICD-10-CM | POA: Diagnosis not present

## 2018-01-23 NOTE — Patient Instructions (Addendum)
Keith [name] , Thank you for taking time to come for your Medicare Wellness Visit. I appreciate your ongoing commitment to your health goals. Please review the following plan we discussed and let me know if I can assist you in the future.   These are the goals we discussed: Goals    . Gain weight    . Patient Stated     Regain health from stroke. Get new glasses and new dentures.       This is a list of the screening recommended for you and due dates:  Health Maintenance  Topic Date Due  . Pneumonia vaccines (1 of 2 - PCV13) 12/11/2018*  . Tetanus Vaccine  06/23/2024  . Flu Shot  Completed  *Topic was postponed. The date shown is not the original due date.     Fall Prevention in the Home, Adult Falls can cause injuries. They can happen to people of all ages. There are many things you can do to make your home safe and to help prevent falls. Ask for help when making these changes, if needed. What actions can I take to prevent falls? General Instructions  Use good lighting in all rooms. Replace any light bulbs that burn out.  Turn on the lights when you go into a dark area. Use night-lights.  Keep items that you use often in easy-to-reach places. Lower the shelves around your home if necessary.  Set up your furniture so you have a clear path. Avoid moving your furniture around.  Do not have throw rugs and other things on the floor that can make you trip.  Avoid walking on wet floors.  If any of your floors are uneven, fix them.  Add color or contrast paint or tape to clearly mark and help you see: ? Any grab bars or handrails. ? First and last steps of stairways. ? Where the edge of each step is.  If you use a stepladder: ? Make sure that it is fully opened. Do not climb a closed stepladder. ? Make sure that both sides of the stepladder are locked into place. ? Ask someone to hold the stepladder for you while you use it.  If there are any pets around you, be aware of  where they are. What can I do in the bathroom?      Keep the floor dry. Clean up any water that spills onto the floor as soon as it happens.  Remove soap buildup in the tub or shower regularly.  Use non-skid mats or decals on the floor of the tub or shower.  Attach bath mats securely with double-sided, non-slip rug tape.  If you need to sit down in the shower, use a plastic, non-slip stool.  Install grab bars by the toilet and in the tub and shower. Do not use towel bars as grab bars. What can I do in the bedroom?  Make sure that you have a light by your bed that is easy to reach.  Do not use any sheets or blankets that are too big for your bed. They should not hang down onto the floor.  Have a firm chair that has side arms. You can use this for support while you get dressed. What can I do in the kitchen?  Clean up any spills right away.  If you need to reach something above you, use a strong step stool that has a grab bar.  Keep electrical cords out of the way.  Do not use floor  polish or wax that makes floors slippery. If you must use wax, use non-skid floor wax. What can I do with my stairs?  Do not leave any items on the stairs.  Make sure that you have a light switch at the top of the stairs and the bottom of the stairs. If you do not have them, ask someone to add them for you.  Make sure that there are handrails on both sides of the stairs, and use them. Fix handrails that are broken or loose. Make sure that handrails are as long as the stairways.  Install non-slip stair treads on all stairs in your home.  Avoid having throw rugs at the top or bottom of the stairs. If you do have throw rugs, attach them to the floor with carpet tape.  Choose a carpet that does not hide the edge of the steps on the stairway.  Check any carpeting to make sure that it is firmly attached to the stairs. Fix any carpet that is loose or worn. What can I do on the outside of my  home?  Use bright outdoor lighting.  Regularly fix the edges of walkways and driveways and fix any cracks.  Remove anything that might make you trip as you walk through a door, such as a raised step or threshold.  Trim any bushes or trees on the path to your home.  Regularly check to see if handrails are loose or broken. Make sure that both sides of any steps have handrails.  Install guardrails along the edges of any raised decks and porches.  Clear walking paths of anything that might make someone trip, such as tools or rocks.  Have any leaves, snow, or ice cleared regularly.  Use sand or salt on walking paths during winter.  Clean up any spills in your garage right away. This includes grease or oil spills. What other actions can I take?  Wear shoes that: ? Have a low heel. Do not wear high heels. ? Have rubber bottoms. ? Are comfortable and fit you well. ? Are closed at the toe. Do not wear open-toe sandals.  Use tools that help you move around (mobility aids) if they are needed. These include: ? Canes. ? Walkers. ? Scooters. ? Crutches.  Review your medicines with your doctor. Some medicines can make you feel dizzy. This can increase your chance of falling. Ask your doctor what other things you can do to help prevent falls. Where to find more information  Centers for Disease Control and Prevention, STEADI: https://garcia.biz/  [employer] on Aging: BrainJudge.co.uk Contact a doctor if:  You are afraid of falling at home.  You feel weak, drowsy, or dizzy at home.  You fall at home. Summary  There are many simple things that you can do to make your home safe and to help prevent falls.  Ways to make your home safe include removing tripping hazards and installing grab bars in the bathroom.  Ask for help when making these changes in your home. This information is not intended to replace advice given to you by your health care provider. Make sure you  discuss any questions you have with your health care provider. Document Released: 10/29/2008 Document Revised: 08/17/2016 Document Reviewed: 08/17/2016 Elsevier Interactive Patient Education  2019 [employer].

## 2018-01-23 NOTE — Progress Notes (Signed)
Subjective:   Keith [name] is a 78 y.o. male who presents for an Initial Medicare Annual Wellness Visit.  Review of Systems  Physical assessment deferred to PCP.  Cardiac Risk Factors include: advanced age (>65men, >62 women);dyslipidemia;male gender    Objective:    Today's Vitals   01/23/18 0909  BP: (!) 136/54  Pulse: (!) 50  Temp: 97.8 F (36.6 C)  TempSrc: Oral  SpO2: 99%  Weight: 118 lb 3.2 oz (53.6 kg)  Height: 5\' 9"  (1.753 m)  PainSc: 0-No pain   Body mass index is 17.46 kg/m.  Advanced Directives 01/23/2018 12/10/2017 06/04/2017 05/29/2017 05/16/2017 04/24/2017 03/20/2017  Does Patient Have a Medical Advance Directive? No No No No No No No  Would patient like information on creating a medical advance directive? No - Patient declined No - Patient declined No - Patient declined No - Patient declined No - Patient declined No - Patient declined No - Patient declined    Current Medications (verified) Outpatient Encounter Medications as of 01/23/2018  Medication Sig  . apixaban (ELIQUIS) 2.5 MG TABS tablet Take 1 tablet (2.5 mg total) by mouth 2 (two) times daily.  Marland Kitchen atorvastatin (LIPITOR) 40 MG tablet Take 1 tablet (40 mg total) by mouth daily at 6 PM.  . erythromycin ophthalmic ointment Place 1 application into the right eye once.  . mupirocin ointment (BACTROBAN) 2 % Place 1 application into the nose daily.  . timolol (TIMOPTIC) 0.5 % ophthalmic solution INSTILL 1 DROP INTO BOTH EYES TWICE A DAY  . brimonidine-timolol (COMBIGAN) 0.2-0.5 % ophthalmic solution Apply 1 drop to eye 2 (two) times daily.  Regino Schultze Bandages & Supports (MEDICAL COMPRESSION STOCKINGS) MISC 1 each by Does not apply route daily. (Patient not taking: Reported on 01/23/2018)   No facility-administered encounter medications on file as of 01/23/2018.     Allergies (verified) Penicillins   History: Past Medical History:  Diagnosis Date  . Cataract    planning surgery  . Chronic kidney disease    "weak Kidneys"  . Complication of anesthesia    patient expressed that he has difficulty post anesthesia with swallowing, chewing, and talking after surgery  . GERD (gastroesophageal reflux disease)    tums prn  . Glaucoma   . History of hiatal hernia   . Hypothyroidism   . Melanoma ([place])    scalp  . Melanoma of scalp ([place]) 04/23/2017  . Obstructive uropathy   . PONV (postoperative nausea and vomiting) 03/20/2017  . Stroke [facility]) 11/2016   speech and right side at time of stroke 04/20/17- all function return to normal   Past Surgical History:  Procedure Laterality Date  . APPLICATION OF A-CELL OF HEAD/NECK N/A 04/23/2017   Procedure: APPLICATION OF INTREGRA;  Surgeon: Irene Limbo, MD;  Location: [place];  Service: Plastics;  Laterality: N/A;  . CATARACT EXTRACTION    . EXCISION MASS HEAD N/A 03/20/2017   Procedure: EXCISION scalp lesion;  Surgeon: Irene Limbo, MD;  Location: [place];  Service: Plastics;  Laterality: N/A;  EXCISION scalp lesion  . EYE SURGERY    . IR ANGIO INTRA EXTRACRAN SEL COM CAROTID INNOMINATE BILAT MOD SED  11/23/2016  . IR ANGIO VERTEBRAL SEL VERTEBRAL UNI L MOD SED  11/23/2016  . LOOP RECORDER INSERTION N/A 11/23/2016   Procedure: LOOP RECORDER INSERTION;  Surgeon: [name], MD;  Location: [place] [facility];  Service: Cardiovascular;  Laterality: N/A;  . MASS EXCISION N/A 04/23/2017   Procedure: EXCISION MALIGNANT  LESION OF SCALP;  Surgeon: Irene Limbo, MD;  Location: [place];  Service: Plastics;  Laterality: N/A;  . MELANOMA EXCISION  04/23/2017   BILATERAL SUPERFICIAL PAROTIDECTOMY/NECK SENTINEL NODE BIOPSY WITH FROZEN SECTION; LEFT SELECTIVE NECK DISSECTION  . PAROTIDECTOMY Bilateral 04/23/2017   Procedure: BILATERAL SUPERFICIAL PAROTIDECTOMY/NECK SENTINEL NODE BIOPSY WITH FROZEN SECTION; LEFT SELECTIVE NECK DISSECTION;  Surgeon: [name], MD;  Location: [place];  Service: ENT;  Laterality: Bilateral;  . SKIN GRAFT SPLIT THICKNESS HEAD / NECK   06/04/2017   SKIN GRAFT SPLIT THICKNESS TO SCALP FROM RIGHT OR LEFT THIGH POSSIBLE A CELL TO DONOR SITE  . SKIN SPLIT GRAFT N/A 06/04/2017   Procedure: SKIN GRAFT SPLIT THICKNESS TO SCALP FROM RIGHT OR LEFT THIGH POSSIBLE A CELL TO DONOR SITE;  Surgeon: Irene Limbo, MD;  Location: [place];  Service: Plastics;  Laterality: N/A;  . THYROIDECTOMY    . TONSILLECTOMY     Family History  Problem Relation Age of Onset  . Lung cancer Father   . Diabetes Brother    Social History   Socioeconomic History  . Marital status: Widowed    Spouse name: Not on file  . Number of children: 1  . Years of education: 78  . Highest education level: 12th grade  Occupational History  . Not on file  Social Needs  . Financial resource strain: Not hard at all  . Food insecurity:    Worry: Never true    Inability: Never true  . Transportation needs:    Medical: No    Non-medical: No  Tobacco Use  . Smoking status: Former Smoker    Packs/day: 2.00    Years: 32.00    Pack years: 64.00    Types: Cigarettes    Last attempt to quit: 1983    Years since quitting: 37.0  . Smokeless tobacco: Never Used  . Tobacco comment: No plans to start  Substance and Sexual Activity  . Alcohol use: No  . Drug use: No  . Sexual activity: Not Currently  Lifestyle  . Physical activity:    Days per week: 7 days    Minutes per session: 60 min  . Stress: Not at all  Relationships  . Social connections:    Talks on phone: More than three times a week    Gets together: More than three times a week    Attends religious service: 1 to 4 times per year    Active member of club or organization: No    Attends meetings of clubs or organizations: Never    Relationship status: Widowed  Other Topics Concern  . Not on file  Social History Narrative   Lives alone in house. One level house. Has one step at back. Smoke alarms. Throw rugs have a back on them. Grab bar at tub.   Has one dog "[place]", walks her several  times a day. Sees son every day.      Eats a good variety of foods, doesn't like chicken, eats vegetables and fruits. Drinks sodas mostly. Now drinking Ensure, water, juice.      Has made an appt to get dentures fixed.   Wears seatbelt in car.      Tobacco Counseling Counseling given: Yes Comment: No plans to start   Clinical Intake:  Pre-visit preparation completed: No  Pain : No/denies pain Pain Score: 0-No pain     Nutritional Status: BMI <19  Underweight Nutritional Risks: Unintentional weight loss Diabetes: No  How  often do you need to have someone help you when you read instructions, pamphlets, or other written materials from your doctor or pharmacy?: 1 - Never What is the last grade level you completed in school?: [place]?: No     Activities of Daily Living In your present state of health, do you have any difficulty performing the following activities: 01/23/2018 06/04/2017  Hearing? Y N  Comment left ear hearing loss -  Vision? Y Y  Comment recent cataract surgery; getting new glasses "getting cataract OR; I have glaucoma"  Difficulty concentrating or making decisions? N N  Walking or climbing stairs? N N  Dressing or bathing? N N  Doing errands, shopping? [place] and eating ? N -  Using the Toilet? N -  In the past six months, have you accidently leaked urine? N -  Do you have problems with loss of bowel control? N -  Managing your Medications? N -  Comment son and daugther in law -  Managing your Finances? N -  Comment Son helps -  Housekeeping or managing your Housekeeping? N -  Some recent data might be hidden     Immunizations and Health Maintenance Immunization History  Administered Date(s) Administered  . Influenza,inj,Quad PF,6+ Mos 12/10/2017  . Tdap 06/24/2014   There are no preventive care reminders to display for this patient.  Patient Care Team: Bonnita Hollow, MD as PCP - General  (Family Medicine) Nwobu, Lyman Bishop, MD as Consulting Physician [facility] Irene Limbo, MD as Consulting Physician (Plastic Surgery) Izora Ribas (Dermatology) Danella Sensing, MD as Consulting Physician (Dermatology) [name], MD as Consulting Physician (Oncology) Sherran Needs, NP as Nurse Practitioner (Nurse Practitioner) Hassell Done Dicky Doe, NP as Nurse Practitioner (Neurology) Venancio Poisson, NP as Nurse Practitioner (Neurology)  Indicate any recent Medical Services you may have received from other than [facility] providers in the past year (date may be approximate).    Assessment:   This is a routine wellness examination for Keith [name].  Hearing/Vision screen  Hearing Screening   Method: Audiometry   125Hz  250Hz  500Hz  1000Hz  2000Hz  3000Hz  4000Hz  6000Hz  8000Hz   Right ear:  Pass Pass Pass Pass  Pass    Left ear:  Fail Fail Fail Pass  Pass      Visual Acuity Screening   Right eye Left eye Both eyes  Without correction: 20/60 20/20 20/40   With correction:       Dietary issues and exercise activities discussed: Current Exercise Habits: Home exercise routine, Type of exercise: walking, Time (Minutes): 30, Frequency (Times/Week): 7, Weekly Exercise (Minutes/Week): 210, Intensity: Mild  Goals    . Gain weight    . Patient Stated     Regain health from stroke. Get new glasses and new dentures.      Depression Screen PHQ 2/9 Scores 01/23/2018 12/10/2017 09/03/2017  PHQ - 2 Score 0 0 0    Fall Risk Fall Risk  01/23/2018 12/10/2017 02/07/2017 12/22/2016  Falls in the past year? 0 0 Yes No    Is the patient's home free of loose throw rugs in walkways, pet beds, electrical cords, etc?   yes      Grab bars in the bathroom? yes      Handrails on the stairs?   yes      Adequate lighting?   yes  Timed Get Up and Go performed: 6 seconds  Cognitive Function: MMSE - Mini Mental  State Exam 01/23/2018  Orientation to time 5  Orientation to Place 5    Registration 3  Attention/ Calculation 5  Recall 3  Language- name 2 objects 2  Language- repeat 1  Language- follow 3 step command 3  Language- read & follow direction 1  Write a sentence 1  Copy design 1  Total score 30     6CIT Screen 01/23/2018  What Year? 0 points  What month? 0 points  What time? 0 points  Count back from 20 0 points  Months in reverse 0 points  Repeat phrase 0 points  Total Score 0    Screening Tests Health Maintenance  Topic Date Due  . PNA vac Low Risk Adult (1 of 2 - PCV13) 12/11/2018 (Originally 10/13/2006)  . TETANUS/TDAP  06/23/2024  . INFLUENZA VACCINE  Completed    Qualifies for Shingles Vaccine? declined  Cancer Screenings: Lung: Low Dose CT Chest recommended if Age 64-80 years, 30 pack-year currently smoking OR have quit w/in 15years. Patient does not qualify. Colorectal: N/A    Plan:  Health maintenance up to date. Declined pneumovax. Has list of dental resources to make appt for new dentures. Is working with Dr Katy Fitch on new glasses. Has appointment tomorrow in geriatric clinic.  I have personally reviewed and noted the following in the patient's chart:   . Medical and social history . Use of alcohol, tobacco or illicit drugs  . Current medications and supplements . Functional ability and status . Nutritional status . Physical activity . Advanced directives . List of other physicians . Hospitalizations, surgeries, and ER visits in previous 12 months . Vitals . Screenings to include cognitive, depression, and falls . Referrals and appointments  In addition, I have reviewed and discussed with patient certain preventive protocols, quality metrics, and best practice recommendations. A written personalized care plan for preventive services as well as general preventive health recommendations were provided to patient.     Esau Grew, RN   01/23/2018

## 2018-01-24 ENCOUNTER — Other Ambulatory Visit: Payer: Self-pay

## 2018-01-24 ENCOUNTER — Ambulatory Visit (INDEPENDENT_AMBULATORY_CARE_PROVIDER_SITE_OTHER): Payer: Medicare Other | Admitting: Family Medicine

## 2018-01-24 ENCOUNTER — Ambulatory Visit: Payer: Medicare Other | Admitting: Licensed Clinical Social Worker

## 2018-01-24 VITALS — BP 114/60 | HR 61 | Temp 97.7°F | Ht 69.0 in | Wt 115.0 lb

## 2018-01-24 DIAGNOSIS — Z789 Other specified health status: Secondary | ICD-10-CM

## 2018-01-24 DIAGNOSIS — E890 Postprocedural hypothyroidism: Secondary | ICD-10-CM | POA: Diagnosis not present

## 2018-01-24 DIAGNOSIS — R7989 Other specified abnormal findings of blood chemistry: Secondary | ICD-10-CM | POA: Diagnosis not present

## 2018-01-24 DIAGNOSIS — M6281 Muscle weakness (generalized): Secondary | ICD-10-CM

## 2018-01-24 DIAGNOSIS — Z23 Encounter for immunization: Secondary | ICD-10-CM | POA: Diagnosis not present

## 2018-01-24 DIAGNOSIS — E7800 Pure hypercholesterolemia, unspecified: Secondary | ICD-10-CM | POA: Diagnosis not present

## 2018-01-24 DIAGNOSIS — R636 Underweight: Secondary | ICD-10-CM | POA: Insufficient documentation

## 2018-01-24 DIAGNOSIS — I959 Hypotension, unspecified: Secondary | ICD-10-CM

## 2018-01-24 HISTORY — DX: Underweight: R63.6

## 2018-01-24 NOTE — Progress Notes (Signed)
I agree with the following assessment and plan.

## 2018-01-24 NOTE — BH Specialist Note (Addendum)
Type of Service: Clinical Social Work Pensions consultant Total time: 45 minutes  Demographics Keith [name] is a 78 y.o. male  referred by Dr. McDiarmid for assessing social needs, advance directives and support.  Patient  was accompanied by his daughter in-law [name]. Reports : only concerns identified today is getting patient's dentures fixed, this is impacting his ability to eat.  [name] has information and will F/U on getting this done. No other with social needs identified today. No barriers to care identified.  Family/Social Information patient lives alone ,enjoys walking his dog daily, watching TV and visiting neighbors, He goes to place of worship and to the store . 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  He would like to get involved or do volunteer work. Family support persons are Son [name] and daughter in-law [name].  Transportation to appointments provided by [employer]. [name] and Richardson Landry also assist with managing finances. No Financial concerns at this time.  Advance Directives: Living Will or Medical POA? Yes , completed today. Is it on file at [facility] Yes.   Durable POA? No. ;  reviewed and provided Legal resources to patient and Trish.  Interventions :   Supportive Counseling,  Advocacy/Education   Clinical Impressions/Recommendations :No psychosocial stressors or barriers identified today. Patient is pleasant and engaged in conversation, he is accustomed to working and misses the interaction with others daily.Patient may benefit from, and is in agreement to consider meals on wheels and getting involved with activities at [employer]. He has great family support. Educational information offered to patient/family/caregiver:  1. Living Will and Medical POA completed to day 2. Resources for Occupational hygienist provided Plan:    1. [name] will F/U on concerns with dentures  2. [place] [address] referral made with [facility] for meals on wheels and day program    3. Patient will F/U with [facility] [place], [place]   [phone] 9:10 AM

## 2018-01-24 NOTE — Therapy (Signed)
[place], [place], [address] Phone: [phone]   Fax:  [phone]  Physical Therapy PT Screen  Patient Details  Name: Keith [name] MRN: [id number] Date of Birth: Mar 07, 1940 No data recorded  Encounter Date: 01/24/2018    Past Medical History:  Diagnosis Date  . Cataract    planning surgery  . Chronic kidney disease    "weak Kidneys"  . Complication of anesthesia    patient expressed that he has difficulty post anesthesia with swallowing, chewing, and talking after surgery  . GERD (gastroesophageal reflux disease)    tums prn  . Glaucoma   . History of hiatal hernia   . Hypothyroidism   . Hypothyroidism, postsurgical 11/20/2016  . Melanoma ([place])    scalp  . Melanoma of scalp ([place]) 04/23/2017  . Obstructive uropathy   . PONV (postoperative nausea and vomiting) 03/20/2017  . Stroke ([place]) 11/2016   speech and right side at time of stroke 04/20/17- all function return to normal  . Underweight 01/24/2018    Past Surgical History:  Procedure Laterality Date  . APPLICATION OF A-CELL OF HEAD/NECK N/A 04/23/2017   Procedure: APPLICATION OF INTREGRA;  Surgeon: Irene Limbo, MD;  Location: [place];  Service: Plastics;  Laterality: N/A;  . CATARACT EXTRACTION    . EXCISION MASS HEAD N/A 03/20/2017   Procedure: EXCISION scalp lesion;  Surgeon: Irene Limbo, MD;  Location: [place];  Service: Plastics;  Laterality: N/A;  EXCISION scalp lesion  . EYE SURGERY    . IR ANGIO INTRA EXTRACRAN SEL COM CAROTID INNOMINATE BILAT MOD SED  11/23/2016  . IR ANGIO VERTEBRAL SEL VERTEBRAL UNI L MOD SED  11/23/2016  . LOOP RECORDER INSERTION N/A 11/23/2016   Procedure: LOOP RECORDER INSERTION;  Surgeon: [name], MD;  Location: [place] [facility];  Service: Cardiovascular;  Laterality: N/A;  . MASS EXCISION N/A 04/23/2017   Procedure: EXCISION MALIGNANT LESION OF SCALP;  Surgeon: Irene Limbo, MD;  Location: [place];  Service:  Plastics;  Laterality: N/A;  . MELANOMA EXCISION  04/23/2017   BILATERAL SUPERFICIAL PAROTIDECTOMY/NECK SENTINEL NODE BIOPSY WITH FROZEN SECTION; LEFT SELECTIVE NECK DISSECTION  . PAROTIDECTOMY Bilateral 04/23/2017   Procedure: BILATERAL SUPERFICIAL PAROTIDECTOMY/NECK SENTINEL NODE BIOPSY WITH FROZEN SECTION; LEFT SELECTIVE NECK DISSECTION;  Surgeon: [name], MD;  Location: [place];  Service: ENT;  Laterality: Bilateral;  . SKIN GRAFT SPLIT THICKNESS HEAD / NECK  06/04/2017   SKIN GRAFT SPLIT THICKNESS TO SCALP FROM RIGHT OR LEFT THIGH POSSIBLE A CELL TO DONOR SITE  . SKIN SPLIT GRAFT N/A 06/04/2017   Procedure: SKIN GRAFT SPLIT THICKNESS TO SCALP FROM RIGHT OR LEFT THIGH POSSIBLE A CELL TO DONOR SITE;  Surgeon: Irene Limbo, MD;  Location: [place];  Service: Plastics;  Laterality: N/A;  . THYROIDECTOMY    . TONSILLECTOMY      Vitals:   01/24/18 1421 01/24/18 1424  BP: (!) 122/58 114/60  Pulse: 61   Temp: 97.7 F (36.5 C)   TempSrc: Oral   SpO2: 100%   Weight: 115 lb (52.2 kg)   Height: 5\' 9"  (1.753 m)            [facility] Pre-Surgical Assessment - 01/24/18 1456    5 Meter Walk Test- trial 1  3.2 sec    5 Meter Walk Test- trial 2  4 sec.     5 Meter Walk Test- trial 3  3.9 sec.    5 meter walk test  average  3.7 sec    4 Stage Balance Test tolerated for:   10 sec.    4 Stage Balance Test Position  4    comment  Pt with excellent balance and recovery , very low risk for fall    Sit To Stand Test- trial 1  8 sec.    Comment  Pt is also able to descend and ascend to sitting on the floor and rise independent          Clinical Impresssion Statement ;Pt is a 78 yo male  presenting to the Geriatric Clinic for geriatric screen . Pt presents with no pain. 12/12- 4 stage balance- test no issues   4.4 ft/sec walking speed. Pt performed 5xSTS in 8  seconds which is indicative of very low fall risk.  Based on Short Physical Performance Battery, pt has a frailty rating of 12/12 with  </= 5/12 considered frail and a score of </= 10/12 indicates one or more mobility limitations.  Pt does not need skilled PT at this time.    A physical screen was completed in today's geriatric clinic visit. Pt was not identified as a high fall risk.  No further PT services are recommended at this time.    Objective measurements completed on examination: See above findings.             PT Education - 01/24/18 1554    Education Details  Pt given PT screen and directed to [facility] resources online for fall prevention and aging well    Person(s) Educated  Patient;Caregiver(s)   dtr/in Sports coach   Methods  Explanation        .    A physical screen was completed in today's geriatric clinic visit,  Pt was not identified as a high fall risk. No further PT services are recommended at this time.      Plan - 01/24/18 1609    Clinical Impression Statement   Clinical Impresssion Statement ;Pt is a 78 yo male  presenting to the Geriatric Clinic for geriatric screen . Pt presents with no pain. 12/12- 4 stage balance- test no issues   4.4 ft/sec walking speed. Pt performed 5xSTS in 8  seconds which is indicative of very low fall risk.        Patient will benefit from skilled therapeutic intervention in order to improve the following deficits and impairments:     Visit Diagnosis: Hypothyroidism, postsurgical - Plan: TSH  Underweight  Muscle weakness (generalized)  Pure hypercholesterolemia - Plan: Lipid panel     Problem List Patient Active Problem List   Diagnosis Date Noted  . Underweight 01/24/2018  . CKD (chronic kidney disease) stage 4, GFR 15-29 ml/min (HCC) 12/10/2017  . Dentures complicating chewing 07/12/9483  . Hypotension 12/10/2017  . Paroxysmal atrial fibrillation ([place]) 11/07/2017  . Bilateral carotid artery stenosis 09/03/2017  . Hyperlipidemia 02/08/2017  . Hemiparesis affecting right side as late effect of cerebrovascular accident (CVA) ([place]) 11/23/2016  .  Gait disturbance, post-stroke 11/23/2016  . Left middle cerebral artery stroke ([place]) 11/23/2016  . SAH (subarachnoid hemorrhage) ([place]) 11/20/2016  . Hypothyroidism, postsurgical 11/20/2016  . Cerebrovascular accident (CVA) due to occlusion of left carotid artery ([place]) 11/20/2016  . Dysphagia, post-stroke     Voncille Lo, PT Certified Exercise Expert for the Aging Adult  01/24/18 4:17 PM Phone: [phone] Fax: [place] [address] [place], [place], [address] Phone: [phone]   Fax:  [phone]  Name: NICHALAS COIN MRN: [id number] Date of Birth: June 17, 1940

## 2018-01-25 LAB — LIPID PANEL
Chol/HDL Ratio: 3.2 ratio (ref 0.0–5.0)
Cholesterol, Total: 116 mg/dL (ref 100–199)
HDL: 36 mg/dL — ABNORMAL LOW (ref >39)
LDL Calculated: 58 mg/dL (ref 0–99)
Triglycerides: 108 mg/dL (ref 0–149)
VLDL Cholesterol Cal: 22 mg/dL (ref 5–40)

## 2018-01-25 LAB — TSH: TSH: 5.85 u[IU]/mL — ABNORMAL HIGH (ref 0.450–4.500)

## 2018-01-28 ENCOUNTER — Encounter: Payer: Self-pay | Admitting: Family Medicine

## 2018-01-28 DIAGNOSIS — R7989 Other specified abnormal findings of blood chemistry: Secondary | ICD-10-CM | POA: Insufficient documentation

## 2018-01-28 MED ORDER — ZOSTER VAC RECOMB ADJUVANTED 50 MCG/0.5ML IM SUSR: 0.5000 mL | Freq: Once | INTRAMUSCULAR | 0 refills | Status: AC

## 2018-01-28 NOTE — Assessment & Plan Note (Addendum)
New problem Declined.  Weight down from 140 lbs prior to CVA and Melanoma surgery with neck dissection and thyroidectomy (?partial) to 118 lbs)  BMI 17.5% (+) ill-fitting dentures.  Slightly elevated TSH which unlikely cause of weight loss. (+) social isolation No dementia, on regular diet, no abdominal pain with eating.  No diarrhea.  No constitutional symptoms, no medication related anorexia, no current swallowing problems though did have one after 01/2017 CVA.   CBC:    Component Value Date/Time   WBC 4.7 11/20/2017 0922   WBC 4.8 11/02/2017 1059   HGB 12.3 (L) 11/20/2017 0922   HGB 11.4 (L) 09/03/2017 1558   HCT 39.1 11/20/2017 0922   HCT 35.4 (L) 09/03/2017 1558   PLT 178 11/20/2017 0922   PLT 191 09/03/2017 1558   MCV 93.3 11/20/2017 0922   MCV 89 09/03/2017 1558   NEUTROABS 2.6 11/20/2017 0922   LYMPHSABS 1.3 11/20/2017 0922   MONOABS 0.5 11/20/2017 0922   EOSABS 0.2 11/20/2017 0922   BASOSABS 0.0 11/20/2017 0922   Comprehensive Metabolic Panel:    Component Value Date/Time   NA 141 11/20/2017 0922   NA 142 09/03/2017 1558   K 5.0 11/20/2017 0922   CL 109 11/20/2017 0922   CO2 27 11/20/2017 0922   BUN 19 11/20/2017 0922   BUN 23 09/03/2017 1558   CREATININE 2.34 (H) 11/20/2017 0922   GLUCOSE 90 11/20/2017 0922   CALCIUM 9.4 11/20/2017 0922   CALCIUM 7.5 (L) 07/11/2010 0400   AST 23 11/20/2017 0922   ALT 23 11/20/2017 0922   ALKPHOS 107 11/20/2017 0922   BILITOT 0.4 11/20/2017 0922   PROT 6.9 11/20/2017 0922   PROT 6.4 09/03/2017 1558   ALBUMIN 3.5 11/20/2017 0922   ALBUMIN 3.9 09/03/2017 1558   Pt planning on meeting a dental prosthetist to have current dentures adjusted.   Dietary supplements encouraged.  Liberalize diet to include fats and protein.  May use shaken salt to foods to encourage. Appetite. Recommend daily Vitamin D 800 to 1000 IU daily. Consider a daily MVI without iron.    Order for Shingrix sent to patient's pharmacy.  He was agreeable to  shingles  vaccination.  PSV23 given during visit.

## 2018-01-28 NOTE — Assessment & Plan Note (Signed)
>>  ASSESSMENT AND PLAN FOR HYPOTENSION WRITTEN ON 01/28/2018 12:12 PM BY MCDIARMID, TODD D, MD  Resolved.

## 2018-01-28 NOTE — Progress Notes (Signed)
[facility]:   Patient is accompanied by: DIL Primary caregiver: self Patient's lives alone. Son and DIL look in on him daily Patient information was obtained from patient and past medical records. History/Exam limitations: none. Primary Care Provider: Bonnita Hollow, MD Referring provider: Marny Lowenstein, MD Reason for referral:  Chief Complaint  Patient presents with  . geri visit  . PT Therapy Note   Previous Report Reviewed: historical medical records, imaging reports: Brain MRI, lab reports, office notes and consultation notes.     Patient's Care Team Patient Care Team: Tracie Harrier, MD as Consulting Physician Irene Limbo, MD as Consulting Physician (Plastic Surgery) Danella Sensing, MD as Consulting Physician (Dermatology) [name], MD as Consulting Physician (Oncology)  ------------------------------------------------------------------------------------------------------------------------------------------------------------------------------------------------------------------------------------------------------------------------------------------------------------------------------------------------------------------------------------------------------------------------------------------------------------------------------------------------------------   HPI by problems:  Chief Complaint  Patient presents with  . geri visit  . PT Therapy Note    Cognitive impairment concern  Are there problems with thinking or recalling?  no   Compared to 5 to 10 years ago, how is the patient at:  Problems with Judgment, e.g., problem making decisions, bad financial decisions, problems with thinking?  no; show no change   Less interested in hobbies or previously enjoyed activities?  show no change   Problem remembering things about family and friends e.g. names,  occupations, birthdays, addresses?  show no change  Problem  remembering conversations or news events a few days later?  no; show no change  Problem remembering what day and month it is? no; show no change  Problem with losing things?  no; show no change  Problem learning to use a new gadget or machine around the house, e.g., cell phones, computer, microwave, remote control?  no; show no change  Problem with handling money for shopping?  no; show no change  Problem handling financial matters, e.g. their pension, checking, credit cards, dealing with the bank?  no; show no change  Problem with getting lost in familiar places?  show no change  Problem with asking the same questions repeatedly or telling the same story repeatedly to the same person(s)?  no; no change   PHQ-2 = 0 from AWV day prior to this visit    Outpatient Encounter Medications as of 01/24/2018  Medication Sig  . apixaban (ELIQUIS) 2.5 MG TABS tablet Take 1 tablet (2.5 mg total) by mouth 2 (two) times daily.  Marland Kitchen atorvastatin (LIPITOR) 40 MG tablet Take 1 tablet (40 mg total) by mouth daily at 6 PM.  . brimonidine-timolol (COMBIGAN) 0.2-0.5 % ophthalmic solution Apply 1 drop to eye 2 (two) times daily.  Regino Schultze Bandages & Supports (MEDICAL COMPRESSION STOCKINGS) MISC 1 each by Does not apply route daily. (Patient not taking: Reported on 01/23/2018)  . erythromycin ophthalmic ointment Place 1 application into the right eye once.  . mupirocin ointment (BACTROBAN) 2 % Place 1 application into the nose daily.  . timolol (TIMOPTIC) 0.5 % ophthalmic solution INSTILL 1 DROP INTO BOTH EYES TWICE A DAY   No facility-administered encounter medications on file as of 01/24/2018.     History Patient Active Problem List   Diagnosis Date Noted  . TSH elevation 01/28/2018  . Underweight 01/24/2018  . CKD (chronic kidney disease) stage 4, GFR 15-29 ml/min (HCC) 12/10/2017  . Dentures complicating chewing 09/47/0962  . Hypotension 12/10/2017  . Paroxysmal atrial fibrillation ([place])  11/07/2017  . Bilateral carotid artery stenosis 09/03/2017  . Hyperlipidemia 02/08/2017  . Hemiparesis affecting right side as late effect of cerebrovascular  accident (CVA) ([place]) 11/23/2016  . Gait disturbance, post-stroke 11/23/2016  . Left middle cerebral artery stroke ([place]) 11/23/2016  . SAH (subarachnoid hemorrhage) ([place]) 11/20/2016  . Cerebrovascular accident (CVA) due to occlusion of left carotid artery ([place]) 11/20/2016  . Dysphagia, post-stroke    Past Medical History:  Diagnosis Date  . Cataract    planning surgery  . Chronic kidney disease    "weak Kidneys"  . Complication of anesthesia    patient expressed that he has difficulty post anesthesia with swallowing, chewing, and talking after surgery  . GERD (gastroesophageal reflux disease)    tums prn  . Glaucoma   . History of hiatal hernia   . Hypothyroidism   . Hypothyroidism, postsurgical 11/20/2016  . Melanoma ([place])    scalp  . Melanoma of scalp ([place]) 04/23/2017  . Obstructive uropathy   . PONV (postoperative nausea and vomiting) 03/20/2017  . Stroke ([place]) 11/2016   speech and right side at time of stroke 04/20/17- all function return to normal  . Underweight 01/24/2018   Past Surgical History:  Procedure Laterality Date  . APPLICATION OF A-CELL OF HEAD/NECK N/A 04/23/2017   Procedure: APPLICATION OF INTREGRA;  Surgeon: Irene Limbo, MD;  Location: [facility];  Service: Plastics;  Laterality: N/A;  . CATARACT EXTRACTION    . EXCISION MASS HEAD N/A 03/20/2017   Procedure: EXCISION scalp lesion;  Surgeon: Irene Limbo, MD;  Location: [facility];  Service: Plastics;  Laterality: N/A;  EXCISION scalp lesion  . EYE SURGERY    . IR ANGIO INTRA EXTRACRAN SEL COM CAROTID INNOMINATE BILAT MOD SED  11/23/2016  . IR ANGIO VERTEBRAL SEL VERTEBRAL UNI L MOD SED  11/23/2016  . LOOP RECORDER INSERTION N/A 11/23/2016   Procedure: LOOP RECORDER INSERTION;  Surgeon: [name], MD;  Location: [facility];  Service: Cardiovascular;   Laterality: N/A;  . MASS EXCISION N/A 04/23/2017   Procedure: EXCISION MALIGNANT LESION OF SCALP;  Surgeon: Irene Limbo, MD;  Location: [facility];  Service: Plastics;  Laterality: N/A;  . MELANOMA EXCISION  04/23/2017   BILATERAL SUPERFICIAL PAROTIDECTOMY/NECK SENTINEL NODE BIOPSY WITH FROZEN SECTION; LEFT SELECTIVE NECK DISSECTION  . PAROTIDECTOMY Bilateral 04/23/2017   Procedure: BILATERAL SUPERFICIAL PAROTIDECTOMY/NECK SENTINEL NODE BIOPSY WITH FROZEN SECTION; LEFT SELECTIVE NECK DISSECTION;  Surgeon: [name], MD;  Location: [facility];  Service: ENT;  Laterality: Bilateral;  . SKIN GRAFT SPLIT THICKNESS HEAD / NECK  06/04/2017   SKIN GRAFT SPLIT THICKNESS TO SCALP FROM RIGHT OR LEFT THIGH POSSIBLE A CELL TO DONOR SITE  . SKIN SPLIT GRAFT N/A 06/04/2017   Procedure: SKIN GRAFT SPLIT THICKNESS TO SCALP FROM RIGHT OR LEFT THIGH POSSIBLE A CELL TO DONOR SITE;  Surgeon: Irene Limbo, MD;  Location: [facility];  Service: Plastics;  Laterality: N/A;  . THYROIDECTOMY    . TONSILLECTOMY     Family History  Problem Relation Age of Onset  . Lung cancer Father   . Diabetes Brother    Social History   Socioeconomic History  . Marital status: Widowed    Spouse name: Not on file  . Number of children: 1  . Years of education: 18  . Highest education level: 12th grade  Occupational History  . Occupation: laborer    Comment: retired  Scientific laboratory technician  . Financial resource strain: Not hard at all  . Food insecurity:    Worry: Never true    Inability: Never true  . Transportation needs:    Medical: No  Non-medical: No  Tobacco Use  . Smoking status: Former Smoker    Packs/day: 2.00    Years: 32.00    Pack years: 64.00    Types: Cigarettes    Last attempt to quit: 1983    Years since quitting: 37.0  . Smokeless tobacco: Never Used  . Tobacco comment: No plans to start  Substance and Sexual Activity  . Alcohol use: No  . Drug use: No  . Sexual activity: Not Currently  Lifestyle  .  Physical activity:    Days per week: 7 days    Minutes per session: 60 min  . Stress: Not at all  Relationships  . Social connections:    Talks on phone: More than three times a week    Gets together: More than three times a week    Attends religious service: 1 to 4 times per year    Active member of club or organization: No    Attends meetings of clubs or organizations: Never    Relationship status: Widowed  Other Topics Concern  . Not on file  Social History Narrative   Lives alone in house. One level house. Has one step at back. Smoke alarms. Throw rugs have a back on them. Grab bar at tub.   Has one dog "[place]", walks her several times a day. Sees son every day.      Eats a good variety of foods, doesn't like chicken, eats vegetables and fruits. Drinks sodas mostly. Now drinking Ensure, water, juice.      Has made an appt to get dentures fixed.   Wears seatbelt in car.         Cardiovascular Risk Factors: CVA, CKD-4,   Educational History: 12 years formal education Personal History of Seizures: No -  Personal History of Stroke: Yes - Left MCA with righ HP in 01/2017.   Personal History of Head Trauma: No -  Personal History of Psychiatric Disorders: No -   Basic Activities of Daily Living  Dressing: Self-care Eating: Self-care Ambulation: Self-care Toileting: Self-care Bathing: Self-care  Instrumental Activities of Daily Living Shopping: Self-care House/Yard Work: Self-care Administration of medications: Self-care Finances: Self-care Telephone: Self-care Transportation: Self-care   Caregivers in home: none   Formal Home Health Assistance  Physical Therapy: no  Occupational Therapy: no             Home Aid / Personal Care Service: no             Homemaker services: no  FALLS in last five office visits:  Fall Risk  01/24/2018 01/23/2018 12/10/2017 02/07/2017 12/22/2016  Falls in the past year? 0 0 0 Yes No    Health Maintenance  reviewed: Immunization History  Administered Date(s) Administered  . Influenza,inj,Quad PF,6+ Mos 12/10/2017  . Tdap 06/24/2014   The patient has no Health Maintenance topics of status Overdue, Due On, or Due Soon  Diet: Regular Nutritional supplements: Ensure shakes  Geriatric Syndromes: Constipation no  Laxative use:no   Incontinence no  Nocturia: no Dizziness no   Syncope no  Balance impairment:no    Skin problems not currently, s/p scalp melanoma resection with lymph node dissection neck   Visual Impairment no   Hearing impairment no Dentures problems: yes Dry mouth: no  Eating impairment no  Impaired Memory or Cognition no   Sleep problems no   Weight loss yes Drug Misadventure: no   Joint pain: no Joint stiffness: no Osteoporosis: no Pressure Ulcers: no Immobility: no  Ankle edema: no History of UTIs: no  ROS Denies fevers/chills; denies changes in appetite Denies changes in smell / taste; Denies [facility] Denies chest pain; Denies constipation;   Denies recent falls/unsteady gait;  Denies unilateral weakness / clumsiness / numbness Denies sadness / anxiety   Vital Signs Weight: 115 lb (52.2 kg) Body mass index is 16.98 kg/m. CrCl cannot be calculated (Patient's most recent lab result is older than the maximum 21 days allowed.). Body surface area is 1.59 meters squared. Vitals:   01/24/18 1421 01/24/18 1424  BP: (!) 122/58 114/60 standing  Pulse: 61   Temp: 97.7 F (36.5 C)   TempSrc: Oral   SpO2: 100%   Weight: 115 lb (52.2 kg)   Height: 5\' 9"  (1.753 m)    Wt Readings from Last 3 Encounters:  01/24/18 115 lb (52.2 kg)  01/23/18 118 lb 3.2 oz (53.6 kg)  01/17/18 115 lb 6.4 oz (52.3 kg)   No exam data present  Physical Examination:  VS reviewed GEN: Alert, Cooperative, Groomed, NAD HEENT: PERRL; EAC bilaterally not occluded, TM's translucent with normal LM, (+) LR;                No cervical LAN, No thyromegaly, No palpable masses COR: RRR,  No M/G/R, No JVD, Normal PMI size and location LUNGS: BCTA, No Acc mm use, speaking in full sentences Gait: No significant path deviation, Step-through present  Psych: Normal affect/thought/speech/language   Mini-Mental State Examination or Montreal Cognitive Assessment:   MMSE - Mini Mental State Exam 01/23/2018  Orientation to time 5  Orientation to Place 5  Registration 3  Attention/ Calculation 5  Recall 3  Language- name 2 objects 2  Language- repeat 1  Language- follow 3 step command 3  Language- read & follow direction 1  Write a sentence 1  Copy design 1  Total score 30         Labs No components found for: [facility]  Lab Results  Component Value Date   VITAMINB12 297 07/10/2010    Lab Results  Component Value Date   FOLATE 16.7 07/10/2010    Lab Results  Component Value Date   TSH 5.850 (H) 01/24/2018    No results found for: RPR  No results found for: HIV    Chemistry      Component Value Date/Time   NA 141 11/20/2017 0922   NA 142 09/03/2017 1558   K 5.0 11/20/2017 0922   CL 109 11/20/2017 0922   CO2 27 11/20/2017 0922   BUN 19 11/20/2017 0922   BUN 23 09/03/2017 1558   CREATININE 2.34 (H) 11/20/2017 0922      Component Value Date/Time   CALCIUM 9.4 11/20/2017 0922   CALCIUM 7.5 (L) 07/11/2010 0400   ALKPHOS 107 11/20/2017 0922   AST 23 11/20/2017 0922   ALT 23 11/20/2017 0922   BILITOT 0.4 11/20/2017 0922       Lab Results  Component Value Date   HGBA1C 5.5 11/20/2016     @10RELATIVEDAYS @No  exam data present Lab Results  Component Value Date   WBC 4.7 11/20/2017   HGB 12.3 (L) 11/20/2017   HCT 39.1 11/20/2017   MCV 93.3 11/20/2017   PLT 178 11/20/2017    Imaging  Brain MRI: November 20, 2016: Left frontal CVA and Left frontal SAH    Personal Strengths Ability for insight Active sense of humor Capable of independent living Communication skills Supportive family/friends  Support System Strengths Supportive  Relationships, Family and  Able to Communicate Effectively   Advanced Directives Code Status: Full Advance Directives:Living Will and HC POA documents completed today  ------------------------------------------------------------------------------------------------------------------------------------------------------------------------------------------------------------------------------------------------------------------------------------------------------------------------------------------------------------------------------------------------------------------------------------------------------------------------------------------------------------  Assessment and Plan: Please see individual consultation notes from physical therapy, pharmacy and social work for today.    Problem List Items Addressed This Visit      Unprioritized   Hyperlipidemia    Agree with increase potency of statin to high potency.       Relevant Orders   Lipid panel (Completed)   Hypotension    Resolved.       Underweight    New problem Declined.  Weight down from 140 lbs prior to CVA and Melanoma surgery with neck dissection and thyroidectomy (?partial) to 118 lbs)  BMI 17.5% (+) ill-fitting dentures.  Slightly elevated TSH which unlikely cause of weight loss. (+) social isolation No dementia, on regular diet, no abdominal pain with eating.  No diarrhea.  No constitutional symptoms, no medication related anorexia, no current swallowing problems though did have one after 01/2017 CVA.   CBC:    Component Value Date/Time   WBC 4.7 11/20/2017 0922   WBC 4.8 11/02/2017 1059   HGB 12.3 (L) 11/20/2017 0922   HGB 11.4 (L) 09/03/2017 1558   HCT 39.1 11/20/2017 0922   HCT 35.4 (L) 09/03/2017 1558   PLT 178 11/20/2017 0922   PLT 191 09/03/2017 1558   MCV 93.3 11/20/2017 0922   MCV 89 09/03/2017 1558   NEUTROABS 2.6 11/20/2017 0922   LYMPHSABS 1.3 11/20/2017 0922   MONOABS 0.5 11/20/2017 0922   EOSABS  0.2 11/20/2017 0922   BASOSABS 0.0 11/20/2017 0922   Comprehensive Metabolic Panel:    Component Value Date/Time   NA 141 11/20/2017 0922   NA 142 09/03/2017 1558   K 5.0 11/20/2017 0922   CL 109 11/20/2017 0922   CO2 27 11/20/2017 0922   BUN 19 11/20/2017 0922   BUN 23 09/03/2017 1558   CREATININE 2.34 (H) 11/20/2017 0922   GLUCOSE 90 11/20/2017 0922   CALCIUM 9.4 11/20/2017 0922   CALCIUM 7.5 (L) 07/11/2010 0400   AST 23 11/20/2017 0922   ALT 23 11/20/2017 0922   ALKPHOS 107 11/20/2017 0922   BILITOT 0.4 11/20/2017 0922   PROT 6.9 11/20/2017 0922   PROT 6.4 09/03/2017 1558   ALBUMIN 3.5 11/20/2017 0922   ALBUMIN 3.9 09/03/2017 1558   Pt planning on meeting a dental prosthetist to have current dentures adjusted.   Dietary supplements encouraged.  Liberalize diet to include fats and protein.  May use shaken salt to foods to encourage. Appetite. Recommend daily Vitamin D 800 to 1000 IU daily. Consider a daily MVI without iron.    Order for Shingrix sent to patient's pharmacy.  He was agreeable to shingles  vaccination.  PSV23 given during visit.        TSH elevation    Other Visit Diagnoses    Hypothyroidism, postsurgical  (Chronic)   -  Primary   Relevant Orders   TSH (Completed)   Muscle weakness (generalized)       Need for zoster vaccine       Relevant Orders   Varicella-zoster vaccine IM (Shingrix)     Underweight New problem Declined.  Weight down from 140 lbs prior to CVA and Melanoma surgery with neck dissection and thyroidectomy (?partial) to 118 lbs)  BMI 17.5% (+) ill-fitting dentures.  Slightly elevated TSH which unlikely cause of weight loss. (+) social isolation No dementia, on regular diet, no abdominal pain  with eating.  No diarrhea.  No constitutional symptoms, no medication related anorexia, no current swallowing problems though did have one after 01/2017 CVA.   CBC:    Component Value Date/Time   WBC 4.7 11/20/2017 0922   WBC 4.8 11/02/2017  1059   HGB 12.3 (L) 11/20/2017 0922   HGB 11.4 (L) 09/03/2017 1558   HCT 39.1 11/20/2017 0922   HCT 35.4 (L) 09/03/2017 1558   PLT 178 11/20/2017 0922   PLT 191 09/03/2017 1558   MCV 93.3 11/20/2017 0922   MCV 89 09/03/2017 1558   NEUTROABS 2.6 11/20/2017 0922   LYMPHSABS 1.3 11/20/2017 0922   MONOABS 0.5 11/20/2017 0922   EOSABS 0.2 11/20/2017 0922   BASOSABS 0.0 11/20/2017 0922   Comprehensive Metabolic Panel:    Component Value Date/Time   NA 141 11/20/2017 0922   NA 142 09/03/2017 1558   K 5.0 11/20/2017 0922   CL 109 11/20/2017 0922   CO2 27 11/20/2017 0922   BUN 19 11/20/2017 0922   BUN 23 09/03/2017 1558   CREATININE 2.34 (H) 11/20/2017 0922   GLUCOSE 90 11/20/2017 0922   CALCIUM 9.4 11/20/2017 0922   CALCIUM 7.5 (L) 07/11/2010 0400   AST 23 11/20/2017 0922   ALT 23 11/20/2017 0922   ALKPHOS 107 11/20/2017 0922   BILITOT 0.4 11/20/2017 0922   PROT 6.9 11/20/2017 0922   PROT 6.4 09/03/2017 1558   ALBUMIN 3.5 11/20/2017 0922   ALBUMIN 3.9 09/03/2017 1558   Pt planning on meeting a dental prosthetist to have current dentures adjusted.   Dietary supplements encouraged.  Liberalize diet to include fats and protein.  May use shaken salt to foods to encourage. Appetite. Recommend daily Vitamin D 800 to 1000 IU daily. Consider a daily MVI without iron.    Order for Shingrix sent to patient's pharmacy.  He was agreeable to shingles  vaccination.  PSV23 given during visit.    Hypotension Resolved.   Hyperlipidemia Agree with increase potency of statin to high potency.       PHYSICAL THERAPY assessment and plan  A physical therapy screen was completed in today's geriatric interdisciplinary assessment clinic visit.  The patient was not identified at a high fall risk.  No further PT services are recommended.     > 60 minutes face to face were spent in total with interdisciplinary discussion, patient and caretaker counseling and coordination of care took more than  20 minutes. The Geriatric interdisciplinary team meet to discuss the patient's assessment, problem list, and recommendations.  The interdisciplinary team consisted of representatives from medicine, pharmacy, physical therapy and social work. The interdisciplinary team meet with the patient and caretakers to review the team's findings, assessments, and recommendations.

## 2018-01-28 NOTE — Patient Instructions (Signed)

## 2018-01-28 NOTE — Assessment & Plan Note (Signed)
Resolved

## 2018-01-28 NOTE — Assessment & Plan Note (Signed)
Agree with increase potency of statin to high potency.

## 2018-01-31 ENCOUNTER — Telehealth: Payer: Self-pay | Admitting: Licensed Clinical Social Worker

## 2018-01-31 DIAGNOSIS — Z8673 Personal history of transient ischemic attack (TIA), and cerebral infarction without residual deficits: Secondary | ICD-10-CM | POA: Insufficient documentation

## 2018-01-31 NOTE — Telephone Encounter (Signed)
F/U call to patient refer. Update on  [facility] Referral for meals on wheels.  Informed patient that he is on the wait list. Per referral patient has been on wait list since 11/07/17.  There is currently a 6 month wait.  Patient will be contacted by Senior resources when space is available.  Patient unable to provide update on dentures.  Asked LCSW to call him back next week.  Plan: LCSW  will call in about 5 to 7 business days   Jessaca Philippi, [place]   [phone] 9:00 AM

## 2018-02-08 ENCOUNTER — Telehealth: Payer: Self-pay | Admitting: Licensed Clinical Social Worker

## 2018-02-08 ENCOUNTER — Encounter: Payer: Self-pay | Admitting: Licensed Clinical Social Worker

## 2018-02-08 NOTE — Telephone Encounter (Signed)
F/U call to patient ref. Resources discussed and referrals made to  [facility] for Adult Day programs.  Referral sent to [place] and Transportation.  Patient reports doing well. Has been contact by agency about transportation but ambivalent about going to activities outside of his home.  LCSW discussed benefits of going, trying new things and reason he originally talked bout wanted to go to the day progrm. Patient appreciative of F/U call. Intervention: Motivational Interviewing and Supportive Counseling, Reflective listening,     Plan: Patient will continue to think about our conversation and various options. The person he spoke to about the program will give him a F/U call today.  Casimer Lanius, [facility]   [phone] 9:53 AM

## 2018-02-28 ENCOUNTER — Other Ambulatory Visit: Payer: Self-pay | Admitting: Internal Medicine

## 2018-02-28 DIAGNOSIS — E785 Hyperlipidemia, unspecified: Secondary | ICD-10-CM

## 2018-03-20 ENCOUNTER — Other Ambulatory Visit: Payer: Self-pay | Admitting: Internal Medicine

## 2018-03-20 DIAGNOSIS — E785 Hyperlipidemia, unspecified: Secondary | ICD-10-CM

## 2018-03-21 ENCOUNTER — Other Ambulatory Visit: Payer: Self-pay | Admitting: Nurse Practitioner

## 2018-03-21 ENCOUNTER — Other Ambulatory Visit: Payer: Self-pay

## 2018-03-21 DIAGNOSIS — E785 Hyperlipidemia, unspecified: Secondary | ICD-10-CM

## 2018-03-21 MED ORDER — ATORVASTATIN CALCIUM 40 MG PO TABS: 40.0000 mg | ORAL_TABLET | Freq: Every day | ORAL | 1 refills | Status: DC

## 2018-04-18 ENCOUNTER — Telehealth: Payer: Self-pay | Admitting: *Deleted

## 2018-04-18 NOTE — Telephone Encounter (Signed)
Spoke with patient about STROKE~AF upcoming visit. Instructed patient we would complete a manuel transmission on 4/13/or that week. I will call patient and walk him through the process. The research appointment will be cancel. Pt verbalized understanding.

## 2018-04-30 ENCOUNTER — Encounter: Payer: Self-pay | Admitting: *Deleted

## 2018-04-30 DIAGNOSIS — Z006 Encounter for examination for normal comparison and control in clinical research program: Secondary | ICD-10-CM

## 2018-04-30 NOTE — Research (Signed)
STROKE~AF Research 18 month manuel transmission (loop Recorder). No changes in meds or hospitalizations.

## 2018-05-02 ENCOUNTER — Telehealth: Payer: Self-pay

## 2018-05-02 NOTE — Telephone Encounter (Signed)
Pt called back and stated that he gave the wrong email for the information of the VV information to be sent. The correct e-mail is [email or web address]

## 2018-05-02 NOTE — Telephone Encounter (Signed)
Spoke with the patient and he gave verbal consent to doing a webex visit and to file his insurance. E-mail has been confirmed.

## 2018-05-02 NOTE — Telephone Encounter (Signed)
Email change and sent to pt for visit on 05/09/2018.

## 2018-05-06 ENCOUNTER — Inpatient Hospital Stay: Payer: Medicare Other | Attending: Oncology

## 2018-05-09 ENCOUNTER — Ambulatory Visit (INDEPENDENT_AMBULATORY_CARE_PROVIDER_SITE_OTHER): Payer: Medicare Other | Admitting: Adult Health

## 2018-05-09 ENCOUNTER — Other Ambulatory Visit: Payer: Self-pay

## 2018-05-09 ENCOUNTER — Encounter: Payer: Self-pay | Admitting: Adult Health

## 2018-05-09 DIAGNOSIS — E785 Hyperlipidemia, unspecified: Secondary | ICD-10-CM | POA: Diagnosis not present

## 2018-05-09 DIAGNOSIS — I63232 Cerebral infarction due to unspecified occlusion or stenosis of left carotid arteries: Secondary | ICD-10-CM

## 2018-05-09 DIAGNOSIS — I480 Paroxysmal atrial fibrillation: Secondary | ICD-10-CM | POA: Diagnosis not present

## 2018-05-09 DIAGNOSIS — I6523 Occlusion and stenosis of bilateral carotid arteries: Secondary | ICD-10-CM

## 2018-05-09 NOTE — Progress Notes (Signed)
[facility]  PATIENT: Keith [name] DOB: 1940-02-23    Virtual Visit via Video Note  I connected with Keith [name] on 05/09/18 at 11:15 AM EDT by a video enabled telemedicine application located remotely in my own home and verified that I am speaking with the correct person using two identifiers who was located at their own home.   I discussed the limitations of evaluation and management by telemedicine and the availability of in person appointments. The patient expressed understanding and agreed to proceed.    REASON FOR VISIT: follow up stroke     HISTORY OF PRESENT ILLNESS: Keith [name] a 78 y.o. male who has been followed in this office since left MCA infarct in 11/2016 with resultant right hemiparesis and dysarthria.  He was initially scheduled today for stroke follow-up office visit but due to COVID-19 safety precautions, visit transition to telemedicine via WebEx with patient's consent. He continues to be stable from a stroke standpoint without residual deficits. He continues to live independently doing all ADLs and majority of IADLs.  He continues on Eliquis for atrial fibrillation and denies bleeding or bruising.  Continues on atorvastatin without myalgias. Lipid panel 01/24/18 LDL 58. Blood pressure yesterday 116/49.  No further concerns at this time.  Denies new or worsening stroke/TIA symptoms.   REVIEW OF SYSTEMS: Full 14 system review of systems performed and notable only for those listed, all others are neg:    ALLERGIES: Allergies  Allergen Reactions  . Penicillins Other (See Comments)    UNKNOWN REACTION FROM CHILDHOOD  Has patient had a PCN reaction causing immediate rash, facial/tongue/throat swelling, SOB or lightheadedness with hypotension: Unknown Has patient had a PCN reaction causing severe rash involving mucus membranes or skin necrosis: Unknown Has patient had a PCN reaction that required hospitalization: Unknown Has  patient had a PCN reaction occurring within the last 10 years: No If all of the above answers are "NO", then may proceed with Cephalosporin use.     HOME MEDICATIONS: Outpatient Medications Prior to Visit  Medication Sig Dispense Refill  . apixaban (ELIQUIS) 2.5 MG TABS tablet Take 1 tablet (2.5 mg total) by mouth 2 (two) times daily. 60 tablet 6  . atorvastatin (LIPITOR) 40 MG tablet Take 1 tablet (40 mg total) by mouth daily at 6 PM. 90 tablet 1  . brimonidine-timolol (COMBIGAN) 0.2-0.5 % ophthalmic solution Apply 1 drop to eye 2 (two) times daily.    Keith [name] Bandages & Supports (MEDICAL COMPRESSION STOCKINGS) MISC 1 each by Does not apply route daily. (Patient not taking: Reported on 01/23/2018) 1 each 0  . erythromycin ophthalmic ointment Place 1 application into the right eye once.    . mupirocin ointment (BACTROBAN) 2 % Place 1 application into the nose daily.    . timolol (TIMOPTIC) 0.5 % ophthalmic solution INSTILL 1 DROP INTO BOTH EYES TWICE A DAY  3   No facility-administered medications prior to visit.     PAST MEDICAL HISTORY: Past Medical History:  Diagnosis Date  . Cataract    planning surgery  . Chronic kidney disease    "weak Kidneys"  . Complication of anesthesia    patient expressed that he has difficulty post anesthesia with swallowing, chewing, and talking after surgery  . GERD (gastroesophageal reflux disease)    tums prn  . Glaucoma   . History of hiatal hernia   . Hypothyroidism   . Hypothyroidism, postsurgical 11/20/2016  . Melanoma ([place])    scalp  .  Melanoma of scalp ([place]) 04/23/2017  . Obstructive uropathy   . PONV (postoperative nausea and vomiting) 03/20/2017  . Stroke ([place]) 11/2016   speech and right side at time of stroke 04/20/17- all function return to normal  . Underweight 01/24/2018    PAST SURGICAL HISTORY: Past Surgical History:  Procedure Laterality Date  . APPLICATION OF A-CELL OF HEAD/NECK N/A 04/23/2017   Procedure: APPLICATION OF  INTREGRA;  Surgeon: Irene Limbo, MD;  Location: [place];  Service: Plastics;  Laterality: N/A;  . CATARACT EXTRACTION    . EXCISION MASS HEAD N/A 03/20/2017   Procedure: EXCISION scalp lesion;  Surgeon: Irene Limbo, MD;  Location: [place];  Service: Plastics;  Laterality: N/A;  EXCISION scalp lesion  . EYE SURGERY    . IR ANGIO INTRA EXTRACRAN SEL COM CAROTID INNOMINATE BILAT MOD SED  11/23/2016  . IR ANGIO VERTEBRAL SEL VERTEBRAL UNI L MOD SED  11/23/2016  . LOOP RECORDER INSERTION N/A 11/23/2016   Procedure: LOOP RECORDER INSERTION;  Surgeon: [name], MD;  Location: [place] [facility];  Service: Cardiovascular;  Laterality: N/A;  . MASS EXCISION N/A 04/23/2017   Procedure: EXCISION MALIGNANT LESION OF SCALP;  Surgeon: Irene Limbo, MD;  Location: [place];  Service: Plastics;  Laterality: N/A;  . MELANOMA EXCISION  04/23/2017   BILATERAL SUPERFICIAL PAROTIDECTOMY/NECK SENTINEL NODE BIOPSY WITH FROZEN SECTION; LEFT SELECTIVE NECK DISSECTION  . PAROTIDECTOMY Bilateral 04/23/2017   Procedure: BILATERAL SUPERFICIAL PAROTIDECTOMY/NECK SENTINEL NODE BIOPSY WITH FROZEN SECTION; LEFT SELECTIVE NECK DISSECTION;  Surgeon: [name], MD;  Location: [place];  Service: ENT;  Laterality: Bilateral;  . SKIN GRAFT SPLIT THICKNESS HEAD / NECK  06/04/2017   SKIN GRAFT SPLIT THICKNESS TO SCALP FROM RIGHT OR LEFT THIGH POSSIBLE A CELL TO DONOR SITE  . SKIN SPLIT GRAFT N/A 06/04/2017   Procedure: SKIN GRAFT SPLIT THICKNESS TO SCALP FROM RIGHT OR LEFT THIGH POSSIBLE A CELL TO DONOR SITE;  Surgeon: Irene Limbo, MD;  Location: [place];  Service: Plastics;  Laterality: N/A;  . THYROIDECTOMY    . TONSILLECTOMY      FAMILY HISTORY: Family History  Problem Relation Age of Onset  . Lung cancer Father   . Diabetes Brother     SOCIAL HISTORY: Social History   Socioeconomic History  . Marital status: Widowed    Spouse name: Not on file  . Number of children: 1  . Years of education: 22  . Highest  education level: 12th grade  Occupational History  . Occupation: laborer    Comment: retired  Scientific laboratory technician  . Financial resource strain: Not hard at all  . Food insecurity:    Worry: Never true    Inability: Never true  . Transportation needs:    Medical: No    Non-medical: No  Tobacco Use  . Smoking status: Former Smoker    Packs/day: 2.00    Years: 32.00    Pack years: 64.00    Types: Cigarettes    Last attempt to quit: 1983    Years since quitting: 37.3  . Smokeless tobacco: Never Used  . Tobacco comment: No plans to start  Substance and Sexual Activity  . Alcohol use: No  . Drug use: No  . Sexual activity: Not Currently  Lifestyle  . Physical activity:    Days per week: 7 days    Minutes per session: 60 min  . Stress: Not at all  Relationships  . Social connections:    Talks on phone: More  than three times a week    Gets together: More than three times a week    Attends religious service: 1 to 4 times per year    Active member of club or organization: No    Attends meetings of clubs or organizations: Never    Relationship status: Widowed  . Intimate partner violence:    Fear of current or ex partner: No    Emotionally abused: No    Physically abused: No    Forced sexual activity: No  Other Topics Concern  . Not on file  Social History Narrative   Lives alone in house. One level house. Has one step at back. Smoke alarms. Throw rugs have a back on them. Grab bar at tub.   Has one dog "Star", walks her several times a day. Sees son every day.      Eats a good variety of foods, doesn't like chicken, eats vegetables and fruits. Drinks sodas mostly. Now drinking Ensure, water, juice.      Has made an appt to get dentures fixed.   Wears seatbelt in car.        PHYSICAL EXAM   Generalized: Frail pleasant elderly Caucasian male, in no acute distress  Head: normocephalic and atraumatic   Neurological examination   Mentation: Alert oriented to time,  place, history taking. Attention span and concentration appropriate. Recent and remote memory intact.  Follows all commands speech and language fluent.  Cranial nerve II-XII: extraocular movements were full without evidence of nystagmus or gaze preference.  hearing was intact to voice. head turning and shoulder shrug were normal and symmetric. Motor: No evidence of weakness project assessment Sensory: UTA but denies temperature variation or numbness/tingling Coordination: finger-nose-finger, heel-to-shin bilaterally, no dysmetria Reflexes: UTA Gait and Station: Rising up from seated position without assistance, normal stance and gait without use of assistive device.    DIAGNOSTIC DATA (LABS, IMAGING, TESTING) - I reviewed patient records, labs, notes, testing and imaging myself where available.  Lipid Panel     Component Value Date/Time   CHOL 116 01/24/2018 1558   TRIG 108 01/24/2018 1558   HDL 36 (L) 01/24/2018 1558   CHOLHDL 3.2 01/24/2018 1558   CHOLHDL 5.2 11/20/2016 0245   VLDL 23 11/20/2016 0245   LDLCALC 58 01/24/2018 1558     Lab Results  Component Value Date   HGBA1C 5.5 11/20/2016    Lab Results  Component Value Date   TSH 5.850 (H) 01/24/2018      ASSESSMENT AND PLAN  Deverick A Rudnicki is a 78 y.o. male with left MCA infarct in 11/2016 without resulting deficits.  Vascular risk factors include bilateral ICA stenosis, new diagnosis since stroke of atrial fibrillation on Eliquis therapy and HLD.  He has been stable from a stroke standpoint without residual deficits or reoccurring of symptoms.    Plan: 1. Left MCA infarct: Continue Eliquis (apixaban) daily  and atorvastatin for secondary stroke prevention. Maintain strict control of hypertension with blood pressure goal below 130/90, diabetes with hemoglobin A1c goal below 6.5% and cholesterol with LDL cholesterol (bad cholesterol) goal below 70 mg/dL.  I also advised the patient to eat a healthy diet with plenty  of whole grains, cereals, fruits and vegetables, exercise regularly with at least 30 minutes of continuous activity daily and maintain ideal body weight. 2. Atrial fibrillation: Continuation of Eliquis and follow-up with cardiology 3. HLD: Advised to continue current treatment regimen along with continued follow-up with PCP for future prescribing  and monitoring of lipid panel 4. Bilateral ICA stenosis: Importance of ongoing risk factor management and modification is indicated  Stable from stroke standpoint and recommend follow-up as needed but advised to call with any questions or concerns regarding stroke management  Greater than 50% of time during this 25 minute visit was spent on counseling,explanation of diagnosis of left MCA infarct, reviewing risk factor management of atrial fibrillation, HLD and bilateral ICA stenosis, planning of further management, discussion with patient and family and coordination of care    Venancio Poisson, AGNP-BC  [facility] [address] [place], [place] [address]  Phone [phone] Fax [phone] Note: This document was prepared with digital dictation and possible smart phrase technology. Any transcriptional errors that result from this process are unintentional.

## 2018-05-09 NOTE — Patient Instructions (Signed)
Continue Eliquis (apixaban) daily  and lipitor  for secondary stroke prevention  Continue to follow up with PCP regarding cholesterol and blood pressure management   Continue to follow with cardiology for atrial fibrillation and eliquis management  Continue to monitor blood pressure at home  Maintain strict control of hypertension with blood pressure goal below 130/90, diabetes with hemoglobin A1c goal below 6.5% and cholesterol with LDL cholesterol (bad cholesterol) goal below 70 mg/dL. I also advised the patient to eat a healthy diet with plenty of whole grains, cereals, fruits and vegetables, exercise regularly and maintain ideal body weight.       Thank you for coming to see [place] at [facility]. I hope we have been able to provide you high quality care today.  You may receive a patient satisfaction survey over the next few weeks. We would appreciate your feedback and comments so that we may continue to improve ourselves and the health of our patients.

## 2018-05-09 NOTE — Progress Notes (Signed)
I agree with the above plan 

## 2018-05-16 ENCOUNTER — Ambulatory Visit (HOSPITAL_COMMUNITY): Payer: Medicare Other

## 2018-05-17 ENCOUNTER — Ambulatory Visit (HOSPITAL_COMMUNITY): Payer: Medicare Other

## 2018-05-21 DIAGNOSIS — E559 Vitamin D deficiency, unspecified: Secondary | ICD-10-CM | POA: Insufficient documentation

## 2018-05-21 DIAGNOSIS — D631 Anemia in chronic kidney disease: Secondary | ICD-10-CM | POA: Insufficient documentation

## 2018-05-23 ENCOUNTER — Telehealth: Payer: Self-pay | Admitting: Oncology

## 2018-05-23 ENCOUNTER — Inpatient Hospital Stay: Payer: Medicare Other | Attending: Oncology | Admitting: Oncology

## 2018-05-23 DIAGNOSIS — Z7901 Long term (current) use of anticoagulants: Secondary | ICD-10-CM

## 2018-05-23 DIAGNOSIS — C434 Malignant melanoma of scalp and neck: Secondary | ICD-10-CM | POA: Diagnosis not present

## 2018-05-23 DIAGNOSIS — Z79899 Other long term (current) drug therapy: Secondary | ICD-10-CM

## 2018-05-23 DIAGNOSIS — Z880 Allergy status to penicillin: Secondary | ICD-10-CM | POA: Diagnosis not present

## 2018-05-23 NOTE — Progress Notes (Signed)
Hematology and Oncology Follow Up for Telemedicine Visits  Keith [name] [id number] 10-31-1940 78 y.o. 05/23/2018 10:19 AM Keith [name], MDJohnson, Keith Batman, MD   I connected with [name] on 05/23/18 at 10:30 AM EDT by telephone visit and verified that I am speaking with the correct person using two identifiers.   I discussed the limitations, risks, security and privacy concerns of performing an evaluation and management service by telemedicine and the availability of in-person appointments. I also discussed with the patient that there may be a patient responsible charge related to this service. The patient expressed understanding and agreed to proceed.  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  He was found to have stage IIC melanoma disease.   Prior Therapy:   He is status post excision of a scalp lesion and sentinel lymph node biopsy and a selective left neck dissection completed in April 2019.  Current therapy: Active surveillance.    Interim History: Keith [name] reports no recent complaints or changes in his health.  He denies any lymphadenopathy or scalp lesions.  Denies any recent hospitalizations or illnesses.  Continues dermatology surveillance without any recent lesions noted. He continues to check his blood pressure periodically without any recent exacerbation.  Continues to be on Eliquis without any recent bleeding complications..    Medications: I have reviewed the patient's current medications.  Current Outpatient Medications  Medication Sig Dispense Refill  . apixaban (ELIQUIS) 2.5 MG TABS tablet Take 1 tablet (2.5 mg total) by mouth 2 (two) times daily. 60 tablet 6  . atorvastatin (LIPITOR) 40 MG tablet Take 1 tablet (40 mg total) by mouth daily at 6 PM. 90  tablet 1  . brimonidine-timolol (COMBIGAN) 0.2-0.5 % ophthalmic solution Apply 1 drop to eye 2 (two) times daily.    Regino Schultze Bandages & Supports (MEDICAL COMPRESSION STOCKINGS) MISC 1 each by Does not apply route daily. (Patient not taking: Reported on 01/23/2018) 1 each 0  . erythromycin ophthalmic ointment Place 1 application into the right eye once.    . mupirocin ointment (BACTROBAN) 2 % Place 1 application into the nose daily.    . timolol (TIMOPTIC) 0.5 % ophthalmic solution INSTILL 1 DROP INTO BOTH EYES TWICE A DAY  3   No current facility-administered medications for this visit.      Allergies:  Allergies  Allergen Reactions  . Penicillins Other (See Comments)    UNKNOWN REACTION FROM CHILDHOOD  Has patient had a PCN reaction causing immediate rash, facial/tongue/throat swelling, SOB or lightheadedness with hypotension: Unknown Has patient had a PCN reaction causing severe rash involving mucus membranes or skin necrosis: Unknown Has patient had a PCN reaction that required hospitalization: Unknown Has patient had a PCN reaction occurring within the last 10 years: No If all of the above answers are "NO", then may proceed with Cephalosporin use.     Past Medical History, Surgical history, Social history, and Family History were reviewed and updated.     Lab Results: Lab Results  Component Value Date   WBC 4.7 11/20/2017   HGB 12.3 (L) 11/20/2017   HCT 39.1 11/20/2017   MCV 93.3 11/20/2017   PLT 178 11/20/2017     Chemistry      Component Value Date/Time   NA 141 11/20/2017 0922   NA 142 09/03/2017  1558   K 5.0 11/20/2017 0922   CL 109 11/20/2017 0922   CO2 27 11/20/2017 0922   BUN 19 11/20/2017 0922   BUN 23 09/03/2017 1558   CREATININE 2.34 (H) 11/20/2017 0922      Component Value Date/Time   CALCIUM 9.4 11/20/2017 0922   CALCIUM 7.5 (L) 07/11/2010 0400   ALKPHOS 107 11/20/2017 0922   AST 23 11/20/2017 0922   ALT 23 11/20/2017 0922   BILITOT 0.4  11/20/2017 0922       Impression and Plan:  78 year old man with:  1.     Stage IIC melanoma of the scalp diagnosed in March 2019.   The natural course of this disease was discussed again and risk of relapse was reviewed.  Given the fact that he has high risk stage II disease I have recommended continued imaging studies review every 6 months.  We will obtain a CT scan of the chest abdomen pelvis in the near future discussed with him over the phone these results.  2.  Dermatology surveillance: I recommend continued dermatology surveillance at this time.  He has follow-up with Dr. Ronnald Ramp.   3. Follow-up: Will be in  6 months for repeat evaluation.   I discussed the assessment and treatment plan with the patient. The patient was provided an opportunity to ask questions and all were answered. The patient agreed with the plan and demonstrated an understanding of the instructions.   The patient was advised to call back or seek an in-person evaluation if the symptoms worsen or if the condition fails to improve as anticipated.  I provided 20  minutes of non face-to-face telephone visit time during this encounter, and > 50% was dedicated to reviewing his disease status, laboratory data review as well as risk of relapse.  Zola Button, MD 05/23/2018 10:19 AM

## 2018-05-23 NOTE — Telephone Encounter (Signed)
Scheduled appt per 5/07 sch message - pt to get an updated schedule in the mail . F/u in 6 months.

## 2018-06-06 ENCOUNTER — Other Ambulatory Visit: Payer: Medicare Other

## 2018-06-12 ENCOUNTER — Other Ambulatory Visit (HOSPITAL_COMMUNITY): Payer: Self-pay | Admitting: Nurse Practitioner

## 2018-06-12 ENCOUNTER — Ambulatory Visit
Admission: RE | Admit: 2018-06-12 | Discharge: 2018-06-12 | Disposition: A | Discharge status: Home / Self Care | Payer: Medicare Other | Source: Ambulatory Visit | Attending: Oncology | Admitting: Oncology

## 2018-06-12 DIAGNOSIS — C434 Malignant melanoma of scalp and neck: Secondary | ICD-10-CM

## 2018-06-27 ENCOUNTER — Other Ambulatory Visit: Payer: Self-pay

## 2018-06-27 ENCOUNTER — Ambulatory Visit (INDEPENDENT_AMBULATORY_CARE_PROVIDER_SITE_OTHER): Payer: Medicare Other | Admitting: Family Medicine

## 2018-06-27 VITALS — BP 110/74 | Ht 69.0 in

## 2018-06-27 DIAGNOSIS — M25572 Pain in left ankle and joints of left foot: Secondary | ICD-10-CM | POA: Insufficient documentation

## 2018-06-27 MED ORDER — PREDNISONE 20 MG PO TABS: 40.0000 mg | ORAL_TABLET | Freq: Every day | ORAL | 0 refills | Status: AC

## 2018-06-27 NOTE — Patient Instructions (Signed)
Gout    Gout is a condition that causes painful swelling of the joints. Gout is a type of inflammation of the joints (arthritis). This condition is caused by having too much uric acid in the body. Uric acid is a chemical that forms when the body breaks down substances called purines. Purines are important for building body proteins.  When the body has too much uric acid, sharp crystals can form and build up inside the joints. This causes pain and swelling. Gout attacks can happen quickly and may be very painful (acute gout). Over time, the attacks can affect more joints and become more frequent (chronic gout). Gout can also cause uric acid to build up under the skin and inside the kidneys.  What are the causes?  This condition is caused by too much uric acid in your blood. This can happen because:   Your kidneys do not remove enough uric acid from your blood. This is the most common cause.   Your body makes too much uric acid. This can happen with some cancers and cancer treatments. It can also occur if your body is breaking down too many red blood cells (hemolytic anemia).   You eat too many foods that are high in purines. These foods include organ meats and some seafood. Alcohol, especially beer, is also high in purines.  A gout attack may be triggered by trauma or stress.  What increases the risk?  You are more likely to develop this condition if you:   Have a family history of gout.   Are male and middle-aged.   Are male and have gone through menopause.   Are obese.   Frequently drink alcohol, especially beer.   Are dehydrated.   Lose weight too quickly.   Have an organ transplant.   Have lead poisoning.   Take certain medicines, including aspirin, cyclosporine, diuretics, levodopa, and niacin.   Have kidney disease.   Have a skin condition called psoriasis.  What are the signs or symptoms?  An attack of acute gout happens quickly. It usually occurs in just one joint. The most common place is  the big toe. Attacks often start at night. Other joints that may be affected include joints of the feet, ankle, knee, fingers, wrist, or elbow. Symptoms of this condition may include:   Severe pain.   Warmth.   Swelling.   Stiffness.   Tenderness. The affected joint may be very painful to touch.   Shiny, red, or purple skin.   Chills and fever.  Chronic gout may cause symptoms more frequently. More joints may be involved. You may also have white or yellow lumps (tophi) on your hands or feet or in other areas near your joints.  How is this diagnosed?  This condition is diagnosed based on your symptoms, medical history, and physical exam. You may have tests, such as:   Blood tests to measure uric acid levels.   Removal of joint fluid with a thin needle (aspiration) to look for uric acid crystals.   X-rays to look for joint damage.  How is this treated?  Treatment for this condition has two phases: treating an acute attack and preventing future attacks. Acute gout treatment may include medicines to reduce pain and swelling, including:   NSAIDs.   Steroids. These are strong anti-inflammatory medicines that can be taken by mouth (orally) or injected into a joint.   Colchicine. This medicine relieves pain and swelling when it is taken soon after an attack. It   can be given by mouth or through an IV.  Preventive treatment may include:   Daily use of smaller doses of NSAIDs or colchicine.   Use of a medicine that reduces uric acid levels in your blood.   Changes to your diet. You may need to see a dietitian about what to eat and drink to prevent gout.  Follow these instructions at home:  During a gout attack     If directed, put ice on the affected area:  ? Put ice in a plastic bag.  ? Place a towel between your skin and the bag.  ? Leave the ice on for 20 minutes, 2-3 times a day.   Raise (elevate) the affected joint above the level of your heart as often as possible.   Rest the joint as much as possible.  If the affected joint is in your leg, you may be given crutches to use.   Follow instructions from your health care provider about eating or drinking restrictions.  Avoiding future gout attacks   Follow a low-purine diet as told by your dietitian or health care provider. Avoid foods and drinks that are high in purines, including liver, kidney, anchovies, asparagus, herring, mushrooms, mussels, and beer.   Maintain a healthy weight or lose weight if you are overweight. If you want to lose weight, talk with your health care provider. It is important that you do not lose weight too quickly.   Start or maintain an exercise program as told by your health care provider.  Eating and drinking   Drink enough fluids to keep your urine pale yellow.   If you drink alcohol:  ? Limit how much you use to:   0-1 drink a day for women.   0-2 drinks a day for men.  ? Be aware of how much alcohol is in your drink. In the [place] one drink equals one 12 oz bottle of beer (355 mL) one 5 oz glass of wine (148 mL), or one 1 oz glass of hard liquor (44 mL).  General instructions   Take over-the-counter and prescription medicines only as told by your health care provider.   Do not drive or use heavy machinery while taking prescription pain medicine.   Return to your normal activities as told by your health care provider. Ask your health care provider what activities are safe for you.   Keep all follow-up visits as told by your health care provider. This is important.  Contact a health care provider if you have:   Another gout attack.   Continuing symptoms of a gout attack after 10 days of treatment.   Side effects from your medicines.   Chills or a fever.   Burning pain when you urinate.   Pain in your lower back or belly.  Get help right away if you:   Have severe or uncontrolled pain.   Cannot urinate.  Summary   Gout is painful swelling of the joints caused by inflammation.   The most common site of pain is the big  toe, but it can affect other joints in the body.   Medicines and dietary changes can help to prevent and treat gout attacks.  This information is not intended to replace advice given to you by your health care provider. Make sure you discuss any questions you have with your health care provider.  Document Released: 12/31/1999 Document Revised: 07/25/2017 Document Reviewed: 07/25/2017  Elsevier Interactive Patient Education  2019 [employer]

## 2018-06-27 NOTE — Progress Notes (Signed)
Subjective:    Patient ID: Keith [name], male    DOB: June 25, 1940, 78 y.o.   MRN: [id number]   CC: Left ankle pain  HPI: Patient is a 78 yo male with a complex past medical history who presents today complaining of left ankle pain. Patient reports pain started 2 days ago all of sudden. He has noticed mild swelling, warmth. He is unable to put any weight on it due to the pain. Patient denies any recent trauma, injury, fall. Patient has a history of osteomyelitis over 60 years ago but has not had any problems with that ankle. Patient denies any fever or chills. Patient denies any history of gout. He his being followed for non metastatic melanoma and had a stroke in the past few months without any significant deficits. He currently denies any chest pain, dizziness, shortness of breath, headaches, vision changes.  Smoking status reviewed   ROS: all other systems were reviewed and are negative other than in the HPI   Past Medical History:  Diagnosis Date  . Cataract    planning surgery  . Chronic kidney disease    "weak Kidneys"  . Complication of anesthesia    patient expressed that he has difficulty post anesthesia with swallowing, chewing, and talking after surgery  . GERD (gastroesophageal reflux disease)    tums prn  . Glaucoma   . History of hiatal hernia   . Hypothyroidism   . Hypothyroidism, postsurgical 11/20/2016  . Melanoma ([place])    scalp  . Melanoma of scalp ([place]) 04/23/2017  . Obstructive uropathy   . PONV (postoperative nausea and vomiting) 03/20/2017  . Stroke ([place]) 11/2016   speech and right side at time of stroke 04/20/17- all function return to normal  . Underweight 01/24/2018    Past Surgical History:  Procedure Laterality Date  . APPLICATION OF A-CELL OF HEAD/NECK N/A 04/23/2017   Procedure: APPLICATION OF INTREGRA;  Surgeon: Irene Limbo, MD;  Location: [facility];  Service: Plastics;  Laterality: N/A;  . CATARACT EXTRACTION    . EXCISION MASS HEAD N/A 03/20/2017    Procedure: EXCISION scalp lesion;  Surgeon: Irene Limbo, MD;  Location: [facility];  Service: Plastics;  Laterality: N/A;  EXCISION scalp lesion  . EYE SURGERY    . IR ANGIO INTRA EXTRACRAN SEL COM CAROTID INNOMINATE BILAT MOD SED  11/23/2016  . IR ANGIO VERTEBRAL SEL VERTEBRAL UNI L MOD SED  11/23/2016  . LOOP RECORDER INSERTION N/A 11/23/2016   Procedure: LOOP RECORDER INSERTION;  Surgeon: [name], MD;  Location: [facility];  Service: Cardiovascular;  Laterality: N/A;  . MASS EXCISION N/A 04/23/2017   Procedure: EXCISION MALIGNANT LESION OF SCALP;  Surgeon: Irene Limbo, MD;  Location: [facility];  Service: Plastics;  Laterality: N/A;  . MELANOMA EXCISION  04/23/2017   BILATERAL SUPERFICIAL PAROTIDECTOMY/NECK SENTINEL NODE BIOPSY WITH FROZEN SECTION; LEFT SELECTIVE NECK DISSECTION  . PAROTIDECTOMY Bilateral 04/23/2017   Procedure: BILATERAL SUPERFICIAL PAROTIDECTOMY/NECK SENTINEL NODE BIOPSY WITH FROZEN SECTION; LEFT SELECTIVE NECK DISSECTION;  Surgeon: [name], MD;  Location: [facility];  Service: ENT;  Laterality: Bilateral;  . SKIN GRAFT SPLIT THICKNESS HEAD / NECK  06/04/2017   SKIN GRAFT SPLIT THICKNESS TO SCALP FROM RIGHT OR LEFT THIGH POSSIBLE A CELL TO DONOR SITE  . SKIN SPLIT GRAFT N/A 06/04/2017   Procedure: SKIN GRAFT SPLIT THICKNESS TO SCALP FROM RIGHT OR LEFT THIGH POSSIBLE A CELL TO DONOR SITE;  Surgeon: Irene Limbo, MD;  Location: [facility];  Service: Clinical cytogeneticist;  Laterality: N/A;  . THYROIDECTOMY    . TONSILLECTOMY      Past medical history, surgical, family, and social history reviewed and updated in the EMR as appropriate.  Objective:  BP 110/74   Ht 5\' 9"  (1.753 m)   BMI 16.98 kg/m   Vitals and nursing note reviewed  General: NAD, pleasant, able to participate in exam Cardiac: RRR, normal heart sounds, no murmurs. 2+ radial and PT pulses bilaterally Respiratory: CTAB, normal effort, No wheezes, rales or rhonchi Abdomen: soft, nontender, nondistended, no  hepatic or splenomegaly, +BS Extremities: Left ankle mild to moderate swelling, warm to the touch, no clear erythema, fluctuance, or drainage. Skin: warm and dry, no rashes noted Neuro: alert and oriented x4, no focal deficits Psych: Normal affect and mood   Assessment & Plan:   Acute left ankle pain Patient presented with left ankle pain for the past two days. Patient unable to put any weight due to pain. On exam, it is warm and swollen. Presentation is concerning for gout attack. No history of gout, however patient does eat a lot of hotdogs and cold cuts. Minimal concern for fracture without any trauma. Patient is frail and could have a fragility fractures. He also has melanoma but no history of metastasis. Less concern about infection such as septic joint given lack of fever and exam findings. --Will treat with short course prednisone for 2 days  --Will follow up on uric acid level --Will cal patient tomorrow to assess pain level with above therapy.    Marjie Skiff, MD [place] PGY-3

## 2018-06-27 NOTE — Assessment & Plan Note (Signed)
Patient presented with left ankle pain for the past two days. Patient unable to put any weight due to pain. On exam, it is warm and swollen. Presentation is concerning for gout attack. No history of gout, however patient does eat a lot of hotdogs and cold cuts. Minimal concern for fracture without any trauma. Patient is frail and could have a fragility fractures. He also has melanoma but no history of metastasis. Less concern about infection such as septic joint given lack of fever and exam findings. --Will treat with short course prednisone for 2 days  --Will follow up on uric acid level --Will cal patient tomorrow to assess pain level with above therapy.

## 2018-06-28 LAB — URIC ACID: Uric Acid: 5.9 mg/dL (ref 3.7–8.6)

## 2018-08-05 ENCOUNTER — Telehealth: Payer: Self-pay | Admitting: *Deleted

## 2018-08-05 NOTE — Telephone Encounter (Signed)
Received a call from patient stating that his carelinq monitor would not complete a manuel transmission. He states he never got a green check mark he is getting a yellow error. When I reviewed his loop in the carelink network portal it shows a full report on 09-Jul-2018 and a summary report yesterday 04-Aug-2018. I questioned if the office had requested a transmission. He states "no, I just like to send one every so often". I informed him that it's capturing the data and that he has not had any events,a manuel transmission is not necessary. I ask him to make sure the device is near a window that it reads off of cell towers. He states he had recently moved it, so maybe that was the issue. I ask him to attempt another transmission and if it does the same to call me and I will send a message to the [employer] tech support.

## 2018-08-21 DIAGNOSIS — Z85820 Personal history of malignant melanoma of skin: Secondary | ICD-10-CM | POA: Diagnosis not present

## 2018-09-04 DIAGNOSIS — L131 Subcorneal pustular dermatitis: Secondary | ICD-10-CM | POA: Diagnosis not present

## 2018-09-04 DIAGNOSIS — Z85820 Personal history of malignant melanoma of skin: Secondary | ICD-10-CM | POA: Diagnosis not present

## 2018-09-04 DIAGNOSIS — D692 Other nonthrombocytopenic purpura: Secondary | ICD-10-CM | POA: Diagnosis not present

## 2018-09-06 ENCOUNTER — Ambulatory Visit (INDEPENDENT_AMBULATORY_CARE_PROVIDER_SITE_OTHER): Payer: Medicare Other | Admitting: *Deleted

## 2018-09-06 DIAGNOSIS — I480 Paroxysmal atrial fibrillation: Secondary | ICD-10-CM

## 2018-09-08 LAB — CUP PACEART REMOTE DEVICE CHECK
Date Time Interrogation Session: 20200821174112
Implantable Pulse Generator Implant Date: 20181108

## 2018-09-13 NOTE — Progress Notes (Signed)
Carelink Summary Report / Loop Recorder 

## 2018-09-16 ENCOUNTER — Other Ambulatory Visit: Payer: Self-pay | Admitting: Family Medicine

## 2018-09-16 DIAGNOSIS — E785 Hyperlipidemia, unspecified: Secondary | ICD-10-CM

## 2018-09-19 DIAGNOSIS — D631 Anemia in chronic kidney disease: Secondary | ICD-10-CM | POA: Diagnosis not present

## 2018-09-19 DIAGNOSIS — N184 Chronic kidney disease, stage 4 (severe): Secondary | ICD-10-CM | POA: Diagnosis not present

## 2018-09-26 DIAGNOSIS — Z8673 Personal history of transient ischemic attack (TIA), and cerebral infarction without residual deficits: Secondary | ICD-10-CM | POA: Diagnosis not present

## 2018-09-26 DIAGNOSIS — D631 Anemia in chronic kidney disease: Secondary | ICD-10-CM | POA: Diagnosis not present

## 2018-09-26 DIAGNOSIS — E611 Iron deficiency: Secondary | ICD-10-CM | POA: Diagnosis not present

## 2018-09-26 DIAGNOSIS — N184 Chronic kidney disease, stage 4 (severe): Secondary | ICD-10-CM | POA: Diagnosis not present

## 2018-10-09 ENCOUNTER — Ambulatory Visit (INDEPENDENT_AMBULATORY_CARE_PROVIDER_SITE_OTHER): Payer: Medicare Other | Admitting: *Deleted

## 2018-10-09 DIAGNOSIS — I63232 Cerebral infarction due to unspecified occlusion or stenosis of left carotid arteries: Secondary | ICD-10-CM

## 2018-10-10 LAB — CUP PACEART REMOTE DEVICE CHECK
Date Time Interrogation Session: 20200923194028
Implantable Pulse Generator Implant Date: 20181108

## 2018-10-15 DIAGNOSIS — H401133 Primary open-angle glaucoma, bilateral, severe stage: Secondary | ICD-10-CM | POA: Diagnosis not present

## 2018-10-15 DIAGNOSIS — Z961 Presence of intraocular lens: Secondary | ICD-10-CM | POA: Diagnosis not present

## 2018-10-15 NOTE — Progress Notes (Signed)
Carelink Summary Report / Loop Recorder 

## 2018-11-05 ENCOUNTER — Telehealth: Payer: Self-pay | Admitting: *Deleted

## 2018-11-05 NOTE — Telephone Encounter (Addendum)
STROKE~AF Called patient to ask him to complete a manuel transmission for month 24 follow up visit. Patient informed me that he cannot transmit that he has been having trouble with monitor. The monitor has been giving a "?". I will contact [employer] and see if we can get new monitor and scheduled patient for an office visit to link loop with monitor or trouble shoot.  UPDATE: 21 /OCT/ 2020 [employer] support contacted and new monitor will be shipped to patient in 7-10 business days. Patient aware and instructed to call research office once new equipment received.

## 2018-11-11 ENCOUNTER — Ambulatory Visit (INDEPENDENT_AMBULATORY_CARE_PROVIDER_SITE_OTHER): Payer: Medicare Other | Admitting: *Deleted

## 2018-11-11 DIAGNOSIS — I480 Paroxysmal atrial fibrillation: Secondary | ICD-10-CM

## 2018-11-12 LAB — CUP PACEART REMOTE DEVICE CHECK
Date Time Interrogation Session: 20201026193813
Implantable Pulse Generator Implant Date: 20181108

## 2018-11-13 ENCOUNTER — Encounter: Payer: Self-pay | Admitting: *Deleted

## 2018-11-13 DIAGNOSIS — Z006 Encounter for examination for normal comparison and control in clinical research program: Secondary | ICD-10-CM

## 2018-11-13 NOTE — Research (Signed)
STROKE~AF Research study month 24 telephone loop transmission visit completed yesterday 12-Nov-2018. Patient denies any Adverse events and no changes in medication.

## 2018-11-20 ENCOUNTER — Telehealth: Payer: Self-pay | Admitting: Oncology

## 2018-11-20 NOTE — Telephone Encounter (Signed)
Returned patient's phone call regarding cancelling 11/06 appointments, per patient's request appointment cancelled. Patient will give a call back when ready to reschedule.

## 2018-11-22 ENCOUNTER — Other Ambulatory Visit: Payer: Medicare Other

## 2018-11-22 ENCOUNTER — Ambulatory Visit: Payer: Medicare Other | Admitting: Oncology

## 2018-11-29 NOTE — Progress Notes (Signed)
Carelink Summary Report / Loop Recorder 

## 2018-12-05 DIAGNOSIS — L131 Subcorneal pustular dermatitis: Secondary | ICD-10-CM | POA: Diagnosis not present

## 2018-12-05 DIAGNOSIS — Z85820 Personal history of malignant melanoma of skin: Secondary | ICD-10-CM | POA: Diagnosis not present

## 2018-12-15 LAB — CUP PACEART REMOTE DEVICE CHECK
Date Time Interrogation Session: 20201128161926
Implantable Pulse Generator Implant Date: 20181108

## 2018-12-16 ENCOUNTER — Ambulatory Visit (INDEPENDENT_AMBULATORY_CARE_PROVIDER_SITE_OTHER): Payer: Medicare Other | Admitting: *Deleted

## 2018-12-16 DIAGNOSIS — I63232 Cerebral infarction due to unspecified occlusion or stenosis of left carotid arteries: Secondary | ICD-10-CM

## 2018-12-26 DIAGNOSIS — N184 Chronic kidney disease, stage 4 (severe): Secondary | ICD-10-CM | POA: Diagnosis not present

## 2018-12-30 DIAGNOSIS — Z85820 Personal history of malignant melanoma of skin: Secondary | ICD-10-CM | POA: Diagnosis not present

## 2019-01-02 ENCOUNTER — Other Ambulatory Visit (HOSPITAL_COMMUNITY): Payer: Self-pay | Admitting: Nurse Practitioner

## 2019-01-06 DIAGNOSIS — E611 Iron deficiency: Secondary | ICD-10-CM | POA: Diagnosis not present

## 2019-01-06 DIAGNOSIS — D631 Anemia in chronic kidney disease: Secondary | ICD-10-CM | POA: Diagnosis not present

## 2019-01-06 DIAGNOSIS — N184 Chronic kidney disease, stage 4 (severe): Secondary | ICD-10-CM | POA: Diagnosis not present

## 2019-01-06 DIAGNOSIS — E559 Vitamin D deficiency, unspecified: Secondary | ICD-10-CM | POA: Diagnosis not present

## 2019-01-08 NOTE — Progress Notes (Signed)
ILR remote 

## 2019-01-16 ENCOUNTER — Ambulatory Visit (INDEPENDENT_AMBULATORY_CARE_PROVIDER_SITE_OTHER): Payer: Medicare Other | Admitting: *Deleted

## 2019-01-16 DIAGNOSIS — I63232 Cerebral infarction due to unspecified occlusion or stenosis of left carotid arteries: Secondary | ICD-10-CM

## 2019-01-17 LAB — CUP PACEART REMOTE DEVICE CHECK
Date Time Interrogation Session: 20201231175136
Implantable Pulse Generator Implant Date: 20181108

## 2019-01-31 ENCOUNTER — Telehealth: Payer: Self-pay

## 2019-01-31 NOTE — Telephone Encounter (Signed)
I spoke with the pt to explain that he do not have to send manual transmission unless we call to ask for one. I told him his monitor is programmed to send automatically. He states when he got a all that his monitor was not working that it made him paranoid. I told him I understand completely. I told him we are watching for the transmissions and we will give him a call if the monitor do not communicate with his loop recorder. I told him his monitor is working like it supposed to and we will give him a call when it is not working. The pt verbalized understanding and thanked me for the call.

## 2019-02-12 DIAGNOSIS — H401133 Primary open-angle glaucoma, bilateral, severe stage: Secondary | ICD-10-CM | POA: Diagnosis not present

## 2019-02-12 DIAGNOSIS — H26492 Other secondary cataract, left eye: Secondary | ICD-10-CM | POA: Diagnosis not present

## 2019-02-12 DIAGNOSIS — Z961 Presence of intraocular lens: Secondary | ICD-10-CM | POA: Diagnosis not present

## 2019-02-17 ENCOUNTER — Ambulatory Visit (INDEPENDENT_AMBULATORY_CARE_PROVIDER_SITE_OTHER): Payer: Medicare Other | Admitting: *Deleted

## 2019-02-17 DIAGNOSIS — I63232 Cerebral infarction due to unspecified occlusion or stenosis of left carotid arteries: Secondary | ICD-10-CM

## 2019-02-17 LAB — CUP PACEART REMOTE DEVICE CHECK
Date Time Interrogation Session: 20210201013259
Implantable Pulse Generator Implant Date: 20181108

## 2019-02-17 NOTE — Progress Notes (Signed)
ILR Remote 

## 2019-02-18 ENCOUNTER — Encounter: Payer: Self-pay | Admitting: Family Medicine

## 2019-02-18 ENCOUNTER — Ambulatory Visit (INDEPENDENT_AMBULATORY_CARE_PROVIDER_SITE_OTHER): Payer: Medicare Other | Admitting: Family Medicine

## 2019-02-18 ENCOUNTER — Other Ambulatory Visit: Payer: Self-pay

## 2019-02-18 VITALS — BP 104/54 | HR 63 | Ht 69.0 in | Wt 111.4 lb

## 2019-02-18 DIAGNOSIS — I63232 Cerebral infarction due to unspecified occlusion or stenosis of left carotid arteries: Secondary | ICD-10-CM

## 2019-02-18 DIAGNOSIS — Z23 Encounter for immunization: Secondary | ICD-10-CM

## 2019-02-18 DIAGNOSIS — K449 Diaphragmatic hernia without obstruction or gangrene: Secondary | ICD-10-CM

## 2019-02-18 DIAGNOSIS — K219 Gastro-esophageal reflux disease without esophagitis: Secondary | ICD-10-CM | POA: Diagnosis not present

## 2019-02-18 DIAGNOSIS — I480 Paroxysmal atrial fibrillation: Secondary | ICD-10-CM

## 2019-02-18 MED ORDER — FAMOTIDINE 20 MG PO TABS: 20.0000 mg | ORAL_TABLET | Freq: Two times a day (BID) | ORAL | 3 refills | Status: DC

## 2019-02-18 NOTE — Assessment & Plan Note (Signed)
Possibly due to history of hiatal hernia.   -Famotidine 20 mg twice daily -Follow-up in 1 month if not improved

## 2019-02-18 NOTE — Assessment & Plan Note (Signed)
History of CVA.  On moderate intensity statin.  Recent lipid panel shows LDL at goal under 70.  Overall has improved functionality from initial deficits.

## 2019-02-18 NOTE — Assessment & Plan Note (Signed)
Anticoagulated with Eliquis with loop recorder.

## 2019-02-18 NOTE — Progress Notes (Signed)
   CHIEF COMPLAINT / HPI:  History of CVA  Also with history of hemiparesis.  Patient has recovered significantly from a stroke.  Has minimal residual deficits.  He is currently on atorvastatin 40 mg.  Patient has history of PAF for which she is anticoagulated on Eliquis 2.5 mg twice daily.  Patient with an event monitor with no recent recorded events.  Patient lives independently but his daughter-in-law comes and helps him regularly.  Poor dentition Improved since he has received his dentures.  He states that he is eating more and incorporating things such as tougher proteins into his diet.  History of stage II melanoma of the scalp Status post Mohs surgery in 05/2018.  Patient had history of poor wound healing.  Followed up with plastic surgery who saw him in 11/2018 as recommended colloid therapy in addition to regular bandage changes.  Since then family reports that has had improved healing.  This is also corresponded with change in diet to incorporate more protein.  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  Overall quality of life is greatly improved since receiving dentures, getting his eyesight improved and returned of normal function.  He is also happy to have no residual elements of cancer and that his nutrition is improved.  CKD4 with iron deficiency and anemia of chronic disease Followed by nephrology.  Recently started on iron supplements.  Health maintenance Needs pneumococcal 13 and flu vaccination  History of hiatal hernia and GERD Reports that he is had some indigestion.  Previously took Tagamet for this but has been several years.  Wakes up in the morning with a sour taste in his mouth and is noticed he has a slightly increased sputum production.  No other infectious symptoms.   PERTINENT  PMH / PSH:  As above   OBJECTIVE: BP (!) 104/54   Pulse 63   Ht 5\' 9"  (1.753 m)   Wt 111 lb 6 oz (50.5 kg)   SpO2 99%   BMI 16.45  kg/m   Gen: NAD, resting comfortably HEENT: Large bandage and well-healing scalp status post Mohs surgery CV: RRR with no murmurs appreciated Pulm: NWOB, CTAB with no crackles, wheezes, or rhonchi GI: Soft, Nontender, Nondistended. MSK: no edema, cyanosis, or clubbing noted Skin: warm, dry Neuro: grossly normal, moves all extremities, able to ambulate on his own without noticeable gait deficiency Psych: Normal affect and thought content   ASSESSMENT / PLAN:  Cerebrovascular accident (CVA) due to occlusion of left carotid artery ([place]) History of CVA.  On moderate intensity statin.  Recent lipid panel shows LDL at goal under 70.  Overall has improved functionality from initial deficits.  Paroxysmal atrial fibrillation ([facility]) Anticoagulated with Eliquis with loop recorder.  Gastroesophageal reflux disease Possibly due to history of hiatal hernia.   -Famotidine 20 mg twice daily -Follow-up in 1 month if not improved   Patient was given flu shot and pneumococcal 13 vaccination  Follow-up in 6 months  Bonnita Hollow, MD [place]

## 2019-02-18 NOTE — Patient Instructions (Signed)
It was a pleasure to see you today! Thank you for choosing [facility] for your primary care. Keith [name] was seen for follow up.  We are getting you your pneumonia shot and your flu shot.  Please continue to check online for your COVID-19 shot.  I am glad to see that you are doing well, please follow-up with me in 6 months for follow-up  Best,  Marny Lowenstein, MD, [place] - PGY3 02/18/2019 11:42 AM

## 2019-03-04 ENCOUNTER — Telehealth: Payer: Self-pay

## 2019-03-04 NOTE — Telephone Encounter (Signed)
Patient's daughter-in-law, [name], calls nurse line regarding safety of COVID vaccine. [name] states that when she made appointment schedulers said to f/u with PCP for approval due to patient being on Eliquis.   To PCP  Please advise  Talbot Grumbling, RN

## 2019-03-05 NOTE — Telephone Encounter (Signed)
Trish informed that it is safe for patient to get vaccine.  She said that he is scheduled to get it on Friday.  Kaida Games,CMA

## 2019-03-12 ENCOUNTER — Telehealth: Payer: Self-pay | Admitting: Oncology

## 2019-03-12 NOTE — Telephone Encounter (Signed)
Rescheduled per 2/24 sch msg, pt req. Called and spoke with pt, confirmed 3/10 appt

## 2019-03-20 ENCOUNTER — Ambulatory Visit (INDEPENDENT_AMBULATORY_CARE_PROVIDER_SITE_OTHER): Payer: Medicare Other | Admitting: *Deleted

## 2019-03-20 DIAGNOSIS — I63232 Cerebral infarction due to unspecified occlusion or stenosis of left carotid arteries: Secondary | ICD-10-CM | POA: Diagnosis not present

## 2019-03-20 LAB — CUP PACEART REMOTE DEVICE CHECK
Date Time Interrogation Session: 20210303192429
Date Time Interrogation Session: 20210304035204
Implantable Pulse Generator Implant Date: 20181108
Implantable Pulse Generator Implant Date: 20181108

## 2019-03-20 NOTE — Progress Notes (Signed)
ILR Remote 

## 2019-03-24 DIAGNOSIS — Z85820 Personal history of malignant melanoma of skin: Secondary | ICD-10-CM | POA: Diagnosis not present

## 2019-03-24 DIAGNOSIS — S0100XD Unspecified open wound of scalp, subsequent encounter: Secondary | ICD-10-CM | POA: Diagnosis not present

## 2019-03-26 ENCOUNTER — Inpatient Hospital Stay (HOSPITAL_BASED_OUTPATIENT_CLINIC_OR_DEPARTMENT_OTHER): Payer: Medicare Other | Admitting: Oncology

## 2019-03-26 ENCOUNTER — Other Ambulatory Visit: Payer: Self-pay

## 2019-03-26 ENCOUNTER — Telehealth: Payer: Self-pay | Admitting: Oncology

## 2019-03-26 ENCOUNTER — Inpatient Hospital Stay: Payer: Medicare Other | Attending: Oncology

## 2019-03-26 VITALS — BP 107/46 | HR 67 | Temp 98.5°F | Resp 17 | Ht 69.0 in | Wt 113.0 lb

## 2019-03-26 DIAGNOSIS — Z7901 Long term (current) use of anticoagulants: Secondary | ICD-10-CM | POA: Insufficient documentation

## 2019-03-26 DIAGNOSIS — Z85820 Personal history of malignant melanoma of skin: Secondary | ICD-10-CM | POA: Insufficient documentation

## 2019-03-26 DIAGNOSIS — D649 Anemia, unspecified: Secondary | ICD-10-CM

## 2019-03-26 DIAGNOSIS — C434 Malignant melanoma of scalp and neck: Secondary | ICD-10-CM

## 2019-03-26 DIAGNOSIS — Z79899 Other long term (current) drug therapy: Secondary | ICD-10-CM | POA: Diagnosis not present

## 2019-03-26 DIAGNOSIS — D509 Iron deficiency anemia, unspecified: Secondary | ICD-10-CM | POA: Diagnosis not present

## 2019-03-26 LAB — CMP (CANCER CENTER ONLY)
ALT: 7 U/L (ref 0–44)
AST: 16 U/L (ref 15–41)
Albumin: 3.4 g/dL — ABNORMAL LOW (ref 3.5–5.0)
Alkaline Phosphatase: 98 U/L (ref 38–126)
Anion gap: 9 (ref 5–15)
BUN: 19 mg/dL (ref 8–23)
CO2: 26 mmol/L (ref 22–32)
Calcium: 9.3 mg/dL (ref 8.9–10.3)
Chloride: 106 mmol/L (ref 98–111)
Creatinine: 2.48 mg/dL — ABNORMAL HIGH (ref 0.61–1.24)
GFR, Est AFR Am: 28 mL/min — ABNORMAL LOW (ref >60)
GFR, Estimated: 24 mL/min — ABNORMAL LOW (ref >60)
Glucose, Bld: 102 mg/dL — ABNORMAL HIGH (ref 70–99)
Potassium: 4.7 mmol/L (ref 3.5–5.1)
Sodium: 141 mmol/L (ref 135–145)
Total Bilirubin: 0.3 mg/dL (ref 0.3–1.2)
Total Protein: 6.8 g/dL (ref 6.5–8.1)

## 2019-03-26 LAB — CBC WITH DIFFERENTIAL (CANCER CENTER ONLY)
Abs Immature Granulocytes: 0.04 10*3/uL (ref 0.00–0.07)
Basophils Absolute: 0 10*3/uL (ref 0.0–0.1)
Basophils Relative: 1 %
Eosinophils Absolute: 0.1 10*3/uL (ref 0.0–0.5)
Eosinophils Relative: 2 %
HCT: 27.6 % — ABNORMAL LOW (ref 39.0–52.0)
Hemoglobin: 8.3 g/dL — ABNORMAL LOW (ref 13.0–17.0)
Immature Granulocytes: 1 %
Lymphocytes Relative: 23 %
Lymphs Abs: 1.5 10*3/uL (ref 0.7–4.0)
MCH: 27.9 pg (ref 26.0–34.0)
MCHC: 30.1 g/dL (ref 30.0–36.0)
MCV: 92.6 fL (ref 80.0–100.0)
Monocytes Absolute: 0.6 10*3/uL (ref 0.1–1.0)
Monocytes Relative: 9 %
Neutro Abs: 4.1 10*3/uL (ref 1.7–7.7)
Neutrophils Relative %: 64 %
Platelet Count: 264 10*3/uL (ref 150–400)
RBC: 2.98 MIL/uL — ABNORMAL LOW (ref 4.22–5.81)
RDW: 14.2 % (ref 11.5–15.5)
WBC Count: 6.4 10*3/uL (ref 4.0–10.5)
nRBC: 0 % (ref 0.0–0.2)

## 2019-03-26 NOTE — Telephone Encounter (Signed)
Scheduled appt per 3/10 los.  Sent a message to HIM pool to get a calendar mailed out. 

## 2019-03-26 NOTE — Progress Notes (Signed)
Hematology and Oncology Follow Up   Keith [name] [id number] 13-Jul-1940 79 y.o. 03/26/2019 11:35 AM Bonnita Hollow, MDThompson, Corena Pilgrim, MD      Principle Diagnosis: 79 year old man with  stage IIC scalp melanoma diagnosed in April 2019.    Prior Therapy:   He is status post excision of a scalp lesion and sentinel lymph node biopsy and a selective left neck dissection completed in April 2019.  Current therapy: Active surveillance.    Interim History: Keith [name] is here for a follow-up visit.  Since the last visit, he reports no major changes in his health.  He denies any new skin rashes or lesions.  He denies any lymphadenopathy or excessive weakness.  He has been prescribed oral iron replacement by his nephrologist she has taken recently.  He denies any hematochezia, melena or excessive fatigue or tiredness.    Medications: Reviewed without changes. Current Outpatient Medications  Medication Sig Dispense Refill  . atorvastatin (LIPITOR) 40 MG tablet TAKE 1 TABLET (40 MG TOTAL) BY MOUTH DAILY AT 6 PM. 90 tablet 1  . brimonidine-timolol (COMBIGAN) 0.2-0.5 % ophthalmic solution Apply 1 drop to eye 2 (two) times daily.    Regino Schultze Bandages & Supports (MEDICAL COMPRESSION STOCKINGS) MISC 1 each by Does not apply route daily. (Patient not taking: Reported on 01/23/2018) 1 each 0  . ELIQUIS 2.5 MG TABS tablet TAKE 1 TABLET BY MOUTH TWICE A DAY 60 tablet 6  . erythromycin ophthalmic ointment Place 1 application into the right eye once.    . famotidine (PEPCID) 20 MG tablet Take 1 tablet (20 mg total) by mouth 2 (two) times daily. 60 tablet 3  . mupirocin ointment (BACTROBAN) 2 % Place 1 application into the nose daily.    . timolol (TIMOPTIC) 0.5 % ophthalmic solution INSTILL 1 DROP INTO BOTH EYES TWICE A DAY  3   No current facility-administered medications for this visit.     Allergies:  Allergies  Allergen Reactions  . Penicillins Other (See Comments)    UNKNOWN  REACTION FROM CHILDHOOD  Has patient had a PCN reaction causing immediate rash, facial/tongue/throat swelling, SOB or lightheadedness with hypotension: Unknown Has patient had a PCN reaction causing severe rash involving mucus membranes or skin necrosis: Unknown Has patient had a PCN reaction that required hospitalization: Unknown Has patient had a PCN reaction occurring within the last 10 years: No If all of the above answers are "NO", then may proceed with Cephalosporin use.    Physical exam:   Blood pressure (!) 107/46, pulse 67, temperature 98.5 F (36.9 C), temperature source Temporal, resp. rate 17, height 5\' 9"  (1.753 m), weight 113 lb (51.3 kg).  ECOG 1  General appearance: Comfortable appearing without any discomfort Head: Normocephalic without any trauma Oropharynx: Mucous membranes are moist and pink without any thrush or ulcers. Eyes: Pupils are equal and round reactive to light. Lymph nodes: No cervical, supraclavicular, inguinal or axillary lymphadenopathy.   Heart:regular rate and rhythm.  S1 and S2 without leg edema. Lung: Clear without any rhonchi or wheezes.  No dullness to percussion. Abdomin: Soft, nontender, nondistended with good bowel sounds.  No hepatosplenomegaly. Musculoskeletal: No joint deformity or effusion.  Full range of motion noted. Neurological: No deficits noted on motor, sensory and deep tendon reflex exam. Skin: Well-healed scars noted on his scalp.  No new lesions noted.       Lab Results: Lab Results  Component Value Date   WBC 4.7 11/20/2017   HGB 12.3 (  L) 11/20/2017   HCT 39.1 11/20/2017   MCV 93.3 11/20/2017   PLT 178 11/20/2017     Chemistry      Component Value Date/Time   NA 141 11/20/2017 0922   NA 142 09/03/2017 1558   K 5.0 11/20/2017 0922   CL 109 11/20/2017 0922   CO2 27 11/20/2017 0922   BUN 19 11/20/2017 0922   BUN 23 09/03/2017 1558   CREATININE 2.34 (H) 11/20/2017 0922      Component Value Date/Time   CALCIUM  9.4 11/20/2017 0922   CALCIUM 7.5 (L) 07/11/2010 0400   ALKPHOS 107 11/20/2017 0922   AST 23 11/20/2017 0922   ALT 23 11/20/2017 0922   BILITOT 0.4 11/20/2017 0922       Impression and Plan:  79 year old man with:  1.    Cutaneous melanoma of the scalp diagnosed in March 2019.  He was found to have   Stage IIC at that time.  He is status post surgical resection at that time and currently on active surveillance.  Imaging studies obtained in May 2020 showed no evidence of metastatic disease at that time.  Risk of relapse was discussed at this time and I recommend continued active surveillance moving forward.  I recommended repeat imaging studies as needed in the future.  2.  Dermatology surveillance: I recommended continued follow-up with Dr. Ronnald Ramp at this time which she is currently doing.  3.  Anemia: Likely related to his chronically insufficiency in addition to iron deficiency.  He is currently on oral iron replacement has follow-up with nephrology.  Require growth factor support and IV iron infusion in the future.   3. Follow-up: We will be in 6 months for repeat evaluation.  20  minutes were dedicated to this visit. The time was spent on reviewing laboratory data, imaging studies, answering questions regarding future plan.   Zola Button, MD 03/26/2019 11:35 AM

## 2019-03-28 ENCOUNTER — Other Ambulatory Visit: Payer: Self-pay | Admitting: Family Medicine

## 2019-03-28 DIAGNOSIS — E785 Hyperlipidemia, unspecified: Secondary | ICD-10-CM

## 2019-04-21 ENCOUNTER — Ambulatory Visit (INDEPENDENT_AMBULATORY_CARE_PROVIDER_SITE_OTHER): Payer: Medicare Other | Admitting: *Deleted

## 2019-04-21 DIAGNOSIS — I63232 Cerebral infarction due to unspecified occlusion or stenosis of left carotid arteries: Secondary | ICD-10-CM | POA: Diagnosis not present

## 2019-04-21 LAB — CUP PACEART REMOTE DEVICE CHECK
Date Time Interrogation Session: 20210404045401
Implantable Pulse Generator Implant Date: 20181108

## 2019-04-22 NOTE — Progress Notes (Signed)
ILR Remote 

## 2019-05-01 ENCOUNTER — Encounter: Payer: Self-pay | Admitting: *Deleted

## 2019-05-01 DIAGNOSIS — Z006 Encounter for examination for normal comparison and control in clinical research program: Secondary | ICD-10-CM

## 2019-05-02 NOTE — Research (Signed)
Stroke AF  25M  Patient stable no changes in med changes.  Will contact patient to do another transmission in 6 months.

## 2019-05-08 ENCOUNTER — Ambulatory Visit (INDEPENDENT_AMBULATORY_CARE_PROVIDER_SITE_OTHER): Payer: Medicare Other | Admitting: Family Medicine

## 2019-05-08 ENCOUNTER — Other Ambulatory Visit: Payer: Self-pay

## 2019-05-08 ENCOUNTER — Encounter: Payer: Self-pay | Admitting: Gastroenterology

## 2019-05-08 ENCOUNTER — Encounter: Payer: Self-pay | Admitting: Family Medicine

## 2019-05-08 ENCOUNTER — Inpatient Hospital Stay (HOSPITAL_COMMUNITY)
Admission: AD | Admit: 2019-05-08 | Discharge: 2019-05-12 | DRG: 375 | Disposition: A | Discharge status: Home / Self Care | Payer: Medicare Other | Source: Ambulatory Visit | Attending: Family Medicine | Admitting: Family Medicine

## 2019-05-08 VITALS — BP 104/46 | HR 71 | Ht 69.0 in | Wt 116.0 lb

## 2019-05-08 DIAGNOSIS — K635 Polyp of colon: Secondary | ICD-10-CM | POA: Diagnosis present

## 2019-05-08 DIAGNOSIS — D631 Anemia in chronic kidney disease: Secondary | ICD-10-CM

## 2019-05-08 DIAGNOSIS — E785 Hyperlipidemia, unspecified: Secondary | ICD-10-CM | POA: Diagnosis present

## 2019-05-08 DIAGNOSIS — K5730 Diverticulosis of large intestine without perforation or abscess without bleeding: Secondary | ICD-10-CM | POA: Diagnosis present

## 2019-05-08 DIAGNOSIS — Z801 Family history of malignant neoplasm of trachea, bronchus and lung: Secondary | ICD-10-CM

## 2019-05-08 DIAGNOSIS — K219 Gastro-esophageal reflux disease without esophagitis: Secondary | ICD-10-CM | POA: Diagnosis present

## 2019-05-08 DIAGNOSIS — D649 Anemia, unspecified: Secondary | ICD-10-CM | POA: Diagnosis present

## 2019-05-08 DIAGNOSIS — H409 Unspecified glaucoma: Secondary | ICD-10-CM | POA: Diagnosis present

## 2019-05-08 DIAGNOSIS — I482 Chronic atrial fibrillation, unspecified: Secondary | ICD-10-CM | POA: Diagnosis present

## 2019-05-08 DIAGNOSIS — N184 Chronic kidney disease, stage 4 (severe): Secondary | ICD-10-CM

## 2019-05-08 DIAGNOSIS — Z20822 Contact with and (suspected) exposure to covid-19: Secondary | ICD-10-CM | POA: Diagnosis present

## 2019-05-08 DIAGNOSIS — R64 Cachexia: Secondary | ICD-10-CM | POA: Diagnosis present

## 2019-05-08 DIAGNOSIS — Z8673 Personal history of transient ischemic attack (TIA), and cerebral infarction without residual deficits: Secondary | ICD-10-CM

## 2019-05-08 DIAGNOSIS — E559 Vitamin D deficiency, unspecified: Secondary | ICD-10-CM | POA: Diagnosis present

## 2019-05-08 DIAGNOSIS — K6389 Other specified diseases of intestine: Secondary | ICD-10-CM

## 2019-05-08 DIAGNOSIS — C187 Malignant neoplasm of sigmoid colon: Secondary | ICD-10-CM | POA: Diagnosis present

## 2019-05-08 DIAGNOSIS — C434 Malignant melanoma of scalp and neck: Secondary | ICD-10-CM | POA: Diagnosis present

## 2019-05-08 DIAGNOSIS — E611 Iron deficiency: Secondary | ICD-10-CM | POA: Diagnosis present

## 2019-05-08 DIAGNOSIS — I6523 Occlusion and stenosis of bilateral carotid arteries: Secondary | ICD-10-CM | POA: Diagnosis present

## 2019-05-08 DIAGNOSIS — Z7901 Long term (current) use of anticoagulants: Secondary | ICD-10-CM

## 2019-05-08 DIAGNOSIS — Z681 Body mass index (BMI) 19 or less, adult: Secondary | ICD-10-CM

## 2019-05-08 DIAGNOSIS — I700 Atherosclerosis of aorta: Secondary | ICD-10-CM | POA: Diagnosis present

## 2019-05-08 DIAGNOSIS — K449 Diaphragmatic hernia without obstruction or gangrene: Secondary | ICD-10-CM | POA: Diagnosis present

## 2019-05-08 DIAGNOSIS — Z880 Allergy status to penicillin: Secondary | ICD-10-CM

## 2019-05-08 DIAGNOSIS — I480 Paroxysmal atrial fibrillation: Secondary | ICD-10-CM | POA: Diagnosis present

## 2019-05-08 DIAGNOSIS — Z87891 Personal history of nicotine dependence: Secondary | ICD-10-CM

## 2019-05-08 DIAGNOSIS — Z833 Family history of diabetes mellitus: Secondary | ICD-10-CM

## 2019-05-08 DIAGNOSIS — C184 Malignant neoplasm of transverse colon: Principal | ICD-10-CM

## 2019-05-08 DIAGNOSIS — Z66 Do not resuscitate: Secondary | ICD-10-CM | POA: Diagnosis present

## 2019-05-08 DIAGNOSIS — E890 Postprocedural hypothyroidism: Secondary | ICD-10-CM | POA: Diagnosis present

## 2019-05-08 DIAGNOSIS — N179 Acute kidney failure, unspecified: Secondary | ICD-10-CM | POA: Diagnosis not present

## 2019-05-08 HISTORY — DX: Anemia, unspecified: D64.9

## 2019-05-08 LAB — FERRITIN: Ferritin: 31 ng/mL (ref 24–336)

## 2019-05-08 LAB — COMPREHENSIVE METABOLIC PANEL
ALT: 9 U/L (ref 0–44)
AST: 17 U/L (ref 15–41)
Albumin: 3 g/dL — ABNORMAL LOW (ref 3.5–5.0)
Alkaline Phosphatase: 64 U/L (ref 38–126)
Anion gap: 11 (ref 5–15)
BUN: 33 mg/dL — ABNORMAL HIGH (ref 8–23)
CO2: 21 mmol/L — ABNORMAL LOW (ref 22–32)
Calcium: 8.6 mg/dL — ABNORMAL LOW (ref 8.9–10.3)
Chloride: 104 mmol/L (ref 98–111)
Creatinine, Ser: 2.53 mg/dL — ABNORMAL HIGH (ref 0.61–1.24)
GFR calc Af Amer: 27 mL/min — ABNORMAL LOW (ref >60)
GFR calc non Af Amer: 24 mL/min — ABNORMAL LOW (ref >60)
Glucose, Bld: 155 mg/dL — ABNORMAL HIGH (ref 70–99)
Potassium: 3.9 mmol/L (ref 3.5–5.1)
Sodium: 136 mmol/L (ref 135–145)
Total Bilirubin: 0.2 mg/dL — ABNORMAL LOW (ref 0.3–1.2)
Total Protein: 5.9 g/dL — ABNORMAL LOW (ref 6.5–8.1)

## 2019-05-08 LAB — CBC WITH DIFFERENTIAL/PLATELET
Abs Immature Granulocytes: 0.03 10*3/uL (ref 0.00–0.07)
Basophils Absolute: 0 10*3/uL (ref 0.0–0.1)
Basophils Relative: 0 %
Eosinophils Absolute: 0.1 10*3/uL (ref 0.0–0.5)
Eosinophils Relative: 1 %
HCT: 23.6 % — ABNORMAL LOW (ref 39.0–52.0)
Hemoglobin: 7 g/dL — ABNORMAL LOW (ref 13.0–17.0)
Immature Granulocytes: 0 %
Lymphocytes Relative: 14 %
Lymphs Abs: 1 10*3/uL (ref 0.7–4.0)
MCH: 29 pg (ref 26.0–34.0)
MCHC: 29.7 g/dL — ABNORMAL LOW (ref 30.0–36.0)
MCV: 97.9 fL (ref 80.0–100.0)
Monocytes Absolute: 0.4 10*3/uL (ref 0.1–1.0)
Monocytes Relative: 5 %
Neutro Abs: 5.4 10*3/uL (ref 1.7–7.7)
Neutrophils Relative %: 80 %
Platelets: 238 10*3/uL (ref 150–400)
RBC: 2.41 MIL/uL — ABNORMAL LOW (ref 4.22–5.81)
RDW: 15.4 % (ref 11.5–15.5)
WBC: 6.8 10*3/uL (ref 4.0–10.5)
nRBC: 0 % (ref 0.0–0.2)

## 2019-05-08 LAB — RESPIRATORY PANEL BY RT PCR (FLU A&B, COVID)
Influenza A by PCR: NEGATIVE
Influenza B by PCR: NEGATIVE
SARS Coronavirus 2 by RT PCR: NEGATIVE

## 2019-05-08 LAB — MAGNESIUM: Magnesium: 2.1 mg/dL (ref 1.7–2.4)

## 2019-05-08 LAB — IRON AND TIBC
Iron: 15 ug/dL — ABNORMAL LOW (ref 45–182)
Saturation Ratios: 4 % — ABNORMAL LOW (ref 17.9–39.5)
TIBC: 363 ug/dL (ref 250–450)
UIBC: 348 ug/dL

## 2019-05-08 LAB — RETICULOCYTES
Immature Retic Fract: 18.2 % — ABNORMAL HIGH (ref 2.3–15.9)
RBC.: 2.4 MIL/uL — ABNORMAL LOW (ref 4.22–5.81)
Retic Count, Absolute: 78.5 10*3/uL (ref 19.0–186.0)
Retic Ct Pct: 3.3 % — ABNORMAL HIGH (ref 0.4–3.1)

## 2019-05-08 LAB — TSH: TSH: 3.525 u[IU]/mL (ref 0.350–4.500)

## 2019-05-08 LAB — PHOSPHORUS: Phosphorus: 4.7 mg/dL — ABNORMAL HIGH (ref 2.5–4.6)

## 2019-05-08 LAB — POCT HEMOGLOBIN: Hemoglobin: 7.7 g/dL — AB (ref 11–14.6)

## 2019-05-08 LAB — PREPARE RBC (CROSSMATCH)

## 2019-05-08 MED ORDER — ATORVASTATIN CALCIUM 40 MG PO TABS
40.0000 mg | ORAL_TABLET | Freq: Every day | ORAL | Status: DC
  Administered 2019-05-09 – 2019-05-11 (×2): 40 mg via ORAL
  Filled 2019-05-08 (×2): qty 1

## 2019-05-08 MED ORDER — PANTOPRAZOLE SODIUM 40 MG IV SOLR
40.0000 mg | Freq: Two times a day (BID) | INTRAVENOUS | Status: DC
  Administered 2019-05-08 – 2019-05-11 (×6): 40 mg via INTRAVENOUS
  Filled 2019-05-08 (×6): qty 40

## 2019-05-08 MED ORDER — SODIUM CHLORIDE 0.9% IV SOLUTION: Freq: Once | INTRAVENOUS | Status: AC

## 2019-05-08 NOTE — Progress Notes (Signed)
SUBJECTIVE:   CHIEF COMPLAINT / HPI:   Fatigue: Patient reports that for the past couple of months he has felt drained.  However over the past month he has felt more drained than normal.  States he will get up and try to walk down the street and will immediately become so tired that he can almost not make it back home.  He denies any dizziness or lightheadedness.  He reports that he can just be walking down the hall and also had this happen.  He denies any orthostatic symptoms.  He reports he has been eating well.  He states he is just been noticing he has been sleeping more than normal.  He states he did have fatigue after receiving both of his Covid vaccines with his second being received on March 16 visit his symptoms persisted after this he does not know if it is related.  Patient is on Eliquis.  He denies ever having a colonoscopy.  He reports that about 1 week ago he had diarrhea with bright red blood.  He states that it has cleared up since then however.  He denies any dark stools.  He reports that he is taking iron daily along with a vitamin.  He follows closely with his radiologist with [facility], Dr. Jason Fila and has an appointment with him on May 5.  He said he has known anemia and this is why he is on the iron.   Patient denies any shortness of breath, chest pain, headaches, fevers, chills, recent illness.  He does endorse some low back pain that has been present over the past few weeks that is new.  PERTINENT  PMH / PSH: h/o CVA, bilateral carotid artery stenosis, h/o subarachnoid hemorrhage, GERD, paroxysmal A. Fib  on Eliquis, CKD stage 4, h/o   OBJECTIVE:  BP (!) 104/46   Pulse 71   Ht 5\' 9"  (1.753 m)   Wt 116 lb (52.6 kg)   SpO2 99%   BMI 17.13 kg/m   Orthostatics: Lying 116/50, HR 68 bpm; sitting 110/46 HR 67 bpm; standing 112/44 HR 78 bpm  General: NAD, pleasant Neck: Supple, no LAD Cardiovascular: RRR, no m/r/g, no LE edema Respiratory: CTA BL, normal work of  breathing Neuro: CN II-XII grossly intact Psych: AOx3, appropriate affect  ASSESSMENT/PLAN:   Anemia in stage 4 chronic kidney disease ([place]) Patient on chronic Eliquis.  No history of colonoscopy.  Does have history of CKD stage IV and this could be patient's new baseline however previously hemoglobin ~11.0 and then 1 month ago when symptoms started hemoglobin dropped to 8.3. Today POCT hemoglobin 7.3 initially and 7.7 on recheck here in office. Patient with significant history of CAD with bilateral carotid artery stenosis and A. fib.  Patient symptomatic feeling fatigued and sleeping more.  Denies any chest pain or shortness of breath.  Discussed outpatient management versus admitting to the hospital given his decreased in hemoglobin with history of blood in stool in the past week.  Patient and daughter-in-law would like for patient to be admitted in order to receive blood in GI consult.   Called for direct admission to med-surg with telemetry for 24-48 hours. I let inpatient team know.  Patient will need type and screen, CBC, iron studies, folate and B12 prior to receiving blood products.  Given history of bright red blood last week and no prior colonoscopy patient would also benefit from GI consult.  Likely that patient's fatigue is 2/2 worsening anemia but may need further  work up if it does not resolve after treating anemia.     [place] Thekla Colborn, DO PGY-3, [facility]

## 2019-05-08 NOTE — H&P (Addendum)
[facility] Admission History and Physical Service Pager: [phone]  Patient name: Keith [name] Medical record number: [id number] Date of birth: September 03, 1940 Age: 79 y.o. Gender: male  Primary Care Provider: Bonnita Hollow, MD Consultants: GI Code Status: DNR Emergency Contact: Louanne Skye [phone]  Chief Complaint: Worsening Fatigue  Assessment and Plan: Keith [name] is a 79 y.o. male presenting from [facility] . PMH is significant for CKD IV, bilateral carotid artery stenosis L>R, CVA in 11/2016, GERD, HLD, h/o hypotension, iron deficiency, vitamin D deficiency, underweight, SAH, paroxysmal a-fib, multiple melanoma's of the scalp (March and April 2019), h/o urinary outlet obstruction s/p transurethral vaporization of the prostate in 2012.   Worsening Fatigue  Symptomatic Anemia: Patient presenting today from [facility] with progressively worsening fatigue in the setting of recent BRBPR on Eliquis and anemia with a hemoglobin of 7.7>7.0 (last Hgb 8.3 in March 2021, baseline appears to be 11-12). Patient is otherwise asymptomatic without any other signs of acute bleed. Denies excessive use of NSAIDs. He is currently on iron supplement but denies any melena.  He has never had a colonoscopy but does appear to have had a positive FOBT in 2012. He has a remote history of malignant melanoma but no family history of colon cancer. On exam, patient is hemodynamically stable on room air and afebrile. Appears pale with conjunctival pallor. Most likely etiology for patient symptoms is symptomatic anemia 2/2 recent acute GI bleed and iron deficiency (iron 15, Sat 4%, Ferriin 31%) although may be multifactorial in setting of known bilateral carotid stenosis and paroxysmal a-fib.  Unlikely thyroid given TSH WNL today. Although he has a history of paroxysmal a-fib, he currently has a implantable cardiac monitor with most recent read on 04/20/19 that showed episode of sinus  rhythm with PACs but no a-fib. No focal weakness or physical exam findings to indicate new stroke. Patient is pretty active for his age, thus less likely deconditioning. Considered other cardiac, pulmonary, or infectious etiology however history, physical, and labs are inconsistent at this time. No history of CAD, however given history of CVA and currently symptomatic, transfusion threshold <8. Will admit patient for blood transfusion and further work up with plan to consult GI for further evaluation. - admit to [place], Dr. Ardelia Mems - obtain CBC, CMP, TSH, FOBT, Mag/Phos - obtain Anemia panel - transfuse 1U pRBC  - f/u post H&H - consult GI in AM - PT/OT recs - EKG - Will obtain orthostatics after transfusion - cardiac monitoring x 24h + telemetry  - normal diet, NPO at midnight - AVOID NSAIDs - SCDs  - hold eliquis  - IV protonix - s/p COVID vaccine x 2   H/o CVA  Bilateral Carotid Stenosis  Questionable h/o SAH: Patient has history of CVA on 11/19/16 appears most likely atherosclerotic in origin. Work up notable for left MCA infarct, felt to be secondary to a large vessel disease source of left ICA. The patient underwent 4V cerebral angiogram by Dr Estanislado Pandy and has been found to have an occluded Lt ICA prox with partial distal reconstitution from the ipsilateral OA. CT head also notable for trace left frontal acute to subacute subarachnoid hemorrhage however repeat CT head w/o contrast on 11/6 negative for SAH. No signs of acute stroke on exam today. Denies any recent headaches, falls, or LOC. He has known Left ICA occlusion and right ICA 60-79% stenosis. Treatment focused on blood pressure control, statin therapy. Currently on eliquis for paroxysmal a-fib. - hold  eliquis in setting of symptomatic anemia - continue Atorvastatin  Paroxysmal A-fib: Patient had implantable loop recorder after his CVA in 2018 to monitor for a-fib. Most recent read notable for sinus rhythm with few PAC's, no  a-fib. Currently on Eliquis.  - hold eliquis as above - cardiac monitoring, telemetry - f/u EKG  HLD:  Last lipid panel 01/24/18: Chol 116, Trig 108, HDL 36, LDL 58. Goal LDL<70. Currently on Atorvastatin 40mg  QD. - continue home meds   CKD IV: Creatinine pending, (baseline 2.2-2.4).  - avoid nephrotoxic agents  History of Elevated TSH;  Last TSH 5.85 on 01/2018. TSH today WNL.  - no further work up needed   FEN/GI: Heart Healthy, Protonix Prophylaxis: SCDs  Disposition: Pending work up.  History of Present Illness:  Keith [name] is a 79 y.o. male presenting with worsening fatigue x 1 month, but feels like it has been getting worse. He notes that he feels like it started after his COVID vaccine on March 16th but has been progressively worsening. He notes he walks his dog daily for 1 mile and had no troubles with this prior. He notes now that he has to sit down as soon as he gets back because of how tired he feels. Denies any chest pain or SOB. He notes he just "gets weak and feels like he cant stand up, lightheadedness/dizziness when he gets very fatigued". Denies any focal weakness. He notes he saw blood bright red in the toilet bowel of moderate amount x 1.5 days that had cleared up by this morning. He denies ever having a colonoscopy. He has no family history of colon cancer. He had an FOBT positive in 2012 but no other screenings since. Denies any hematuria, hematemesis. Denies any abdominal pain, nausea, vomiting. He notes he has been eating and drinking without any difficulty. Denies any palpitations or tachycardia. He notes he monitors his blood pressure and other than some soft blood pressures, his vitals have been normal. Denies any lightheadedness/dizziness with changing position.   He notes he takes an iron supplement. Denies taking any advil/aleve. He notes he does not use NSAIDs, however he did take 2 aspirins the other night for back pain which was the first time that he took  anything like this for a long time.   Review Of Systems: Per HPI with the following additions:  Review of Systems  Constitutional: Negative for chills, fever and weight loss.  HENT: Negative for congestion and sore throat.   Respiratory: Negative for cough, hemoptysis, shortness of breath and wheezing.   Cardiovascular: Negative for chest pain, palpitations, orthopnea, leg swelling and PND.  Gastrointestinal: Positive for blood in stool. Negative for abdominal pain, constipation, diarrhea, melena, nausea and vomiting.  Genitourinary: Negative for hematuria.  Musculoskeletal: Negative for falls.  Neurological: Positive for dizziness and weakness. Negative for focal weakness, loss of consciousness and headaches.  All other systems reviewed and are negative.   Patient Active Problem List   Diagnosis Date Noted  . Symptomatic anemia 05/08/2019  . Gastroesophageal reflux disease 02/18/2019  . Iron deficiency 09/26/2018  . Anemia in stage 4 chronic kidney disease ([place]) 05/21/2018  . Vitamin D deficiency 05/21/2018  . History of CVA (cerebrovascular accident) 01/31/2018  . TSH elevation 01/28/2018  . Underweight 01/24/2018  . CKD (chronic kidney disease) stage 4, GFR 15-29 ml/min (HCC) 12/10/2017  . Dentures complicating chewing 84/69/6295  . Hypotension 12/10/2017  . Paroxysmal atrial fibrillation ([place]) 11/07/2017  . Bilateral carotid artery stenosis 09/03/2017  .  Malignant melanoma of skin of scalp ([place]) 03/26/2017  . Hyperlipidemia 02/08/2017  . Gait disturbance, post-stroke 11/23/2016  . SAH (subarachnoid hemorrhage) ([place]) 11/20/2016  . Cerebrovascular accident (CVA) due to occlusion of left carotid artery ([place]) 11/20/2016    Past Medical History: Past Medical History:  Diagnosis Date  . Cataract    planning surgery  . Chronic kidney disease    "weak Kidneys"  . Complication of anesthesia    patient expressed that he has difficulty post anesthesia with swallowing, chewing,  and talking after surgery  . GERD (gastroesophageal reflux disease)    tums prn  . Glaucoma   . History of hiatal hernia   . Hypothyroidism   . Hypothyroidism, postsurgical 11/20/2016  . Left middle cerebral artery stroke ([place]) 11/23/2016  . Melanoma ([place])    scalp  . Melanoma of scalp ([place]) 04/23/2017  . Obstructive uropathy   . PONV (postoperative nausea and vomiting) 03/20/2017  . Stroke ([place]) 11/2016   speech and right side at time of stroke 04/20/17- all function return to normal  . Underweight 01/24/2018    Past Surgical History: Past Surgical History:  Procedure Laterality Date  . APPLICATION OF A-CELL OF HEAD/NECK N/A 04/23/2017   Procedure: APPLICATION OF INTREGRA;  Surgeon: Irene Limbo, MD;  Location: [facility];  Service: Plastics;  Laterality: N/A;  . CATARACT EXTRACTION    . EXCISION MASS HEAD N/A 03/20/2017   Procedure: EXCISION scalp lesion;  Surgeon: Irene Limbo, MD;  Location: [facility];  Service: Plastics;  Laterality: N/A;  EXCISION scalp lesion  . EYE SURGERY    . IR ANGIO INTRA EXTRACRAN SEL COM CAROTID INNOMINATE BILAT MOD SED  11/23/2016  . IR ANGIO VERTEBRAL SEL VERTEBRAL UNI L MOD SED  11/23/2016  . LOOP RECORDER INSERTION N/A 11/23/2016   Procedure: LOOP RECORDER INSERTION;  Surgeon: [name], MD;  Location: [facility];  Service: Cardiovascular;  Laterality: N/A;  . MASS EXCISION N/A 04/23/2017   Procedure: EXCISION MALIGNANT LESION OF SCALP;  Surgeon: Irene Limbo, MD;  Location: [facility];  Service: Plastics;  Laterality: N/A;  . MELANOMA EXCISION  04/23/2017   BILATERAL SUPERFICIAL PAROTIDECTOMY/NECK SENTINEL NODE BIOPSY WITH FROZEN SECTION; LEFT SELECTIVE NECK DISSECTION  . PAROTIDECTOMY Bilateral 04/23/2017   Procedure: BILATERAL SUPERFICIAL PAROTIDECTOMY/NECK SENTINEL NODE BIOPSY WITH FROZEN SECTION; LEFT SELECTIVE NECK DISSECTION;  Surgeon: [name], MD;  Location: [facility];  Service: ENT;  Laterality: Bilateral;  . SKIN GRAFT SPLIT THICKNESS HEAD  / NECK  06/04/2017   SKIN GRAFT SPLIT THICKNESS TO SCALP FROM RIGHT OR LEFT THIGH POSSIBLE A CELL TO DONOR SITE  . SKIN SPLIT GRAFT N/A 06/04/2017   Procedure: SKIN GRAFT SPLIT THICKNESS TO SCALP FROM RIGHT OR LEFT THIGH POSSIBLE A CELL TO DONOR SITE;  Surgeon: Irene Limbo, MD;  Location: [facility];  Service: Plastics;  Laterality: N/A;  . THYROIDECTOMY    . TONSILLECTOMY      Social History: Social History   Tobacco Use  . Smoking status: Former Smoker    Packs/day: 2.00    Years: 32.00    Pack years: 64.00    Types: Cigarettes    Quit date: 1983    Years since quitting: 38.3  . Smokeless tobacco: Never Used  . Tobacco comment: No plans to start  Substance Use Topics  . Alcohol use: No  . Drug use: No   Additional social history: Please also refer to relevant sections of EMR.  Family History: Family History  Problem Relation  Age of Onset  . Lung cancer Father   . Diabetes Brother    Allergies and Medications: Allergies  Allergen Reactions  . Penicillins Other (See Comments)    UNKNOWN REACTION FROM CHILDHOOD  Has patient had a PCN reaction causing immediate rash, facial/tongue/throat swelling, SOB or lightheadedness with hypotension: Unknown Has patient had a PCN reaction causing severe rash involving mucus membranes or skin necrosis: Unknown Has patient had a PCN reaction that required hospitalization: Unknown Has patient had a PCN reaction occurring within the last 10 years: No If all of the above answers are "NO", then may proceed with Cephalosporin use.    No current facility-administered medications on file prior to encounter.   Current Outpatient Medications on File Prior to Encounter  Medication Sig Dispense Refill  . atorvastatin (LIPITOR) 40 MG tablet TAKE 1 TABLET (40 MG TOTAL) BY MOUTH DAILY AT 6 PM. 90 tablet 1  . brimonidine-timolol (COMBIGAN) 0.2-0.5 % ophthalmic solution Apply 1 drop to eye 2 (two) times daily.    . Dapsone 5 % topical gel APPLY 1  APPLICATION DAILY TO SCALP AND COVER    . Elastic Bandages & Supports (MEDICAL COMPRESSION STOCKINGS) MISC 1 each by Does not apply route daily. (Patient not taking: Reported on 01/23/2018) 1 each 0  . ELIQUIS 2.5 MG TABS tablet TAKE 1 TABLET BY MOUTH TWICE A DAY 60 tablet 6  . ergocalciferol (VITAMIN D2) 1.25 MG (50000 UT) capsule Take by mouth.    . erythromycin ophthalmic ointment Place 1 application into the right eye once.    . famotidine (PEPCID) 20 MG tablet Take 1 tablet (20 mg total) by mouth 2 (two) times daily. 60 tablet 3  . ferrous sulfate 325 (65 FE) MG tablet Take by mouth.    . Multiple Vitamin (MULTIVITAMIN) capsule Take by mouth.    . mupirocin ointment (BACTROBAN) 2 % Place 1 application into the nose daily.    . timolol (TIMOPTIC) 0.25 % ophthalmic solution     . timolol (TIMOPTIC) 0.5 % ophthalmic solution INSTILL 1 DROP INTO BOTH EYES TWICE A DAY  3    Objective: BP (!) 122/55 (BP Location: Left Arm)   Pulse 65   Temp 98.4 F (36.9 C) (Oral)   Resp 18   SpO2 100%  Exam: General: very thin, pale, pleasant elderly male, HEENT: normocephalic, atraumatic, conjunctival pallor CV: regular rate and rhythm,  no lower extremity edema, 2+ radial and pedal pulses  Lungs: clear to auscultation bilaterally with normal work of breathing Abdomen: soft, non-tender, non-distended,  normoactive bowel sounds Skin: warm, dry, pallor, conjunctival pallor Extremities: warm and well perfused, normal tone Neuro: Alert and oriented, speech normal, EOMI, 5/5 strength in UE/LE bilaterally  Labs and Imaging: CBC BMET  Recent Labs  Lab 05/08/19 1858  WBC 6.8  HGB 7.0*  HCT 23.6*  PLT 238   No results for input(s): NA, K, CL, CO2, BUN, CREATININE, GLUCOSE, CALCIUM in the last 168 hours.   Hgb 7.7>7.0 COVID neg Iron 15, TIBC 363, Sat ratio 4% Ferritin: 31 Retic 3.3% TSH: 3.525  CBC    Component Value Date/Time   WBC 6.8 05/08/2019 1858   RBC 2.40 (L) 05/08/2019 1858   RBC  2.41 (L) 05/08/2019 1858   HGB 7.0 (L) 05/08/2019 1858   HGB 8.3 (L) 03/26/2019 1127   HGB 11.4 (L) 09/03/2017 1558   HCT 23.6 (L) 05/08/2019 1858   HCT 35.4 (L) 09/03/2017 1558   PLT 238 05/08/2019 1858  PLT 264 03/26/2019 1127   PLT 191 09/03/2017 1558   MCV 97.9 05/08/2019 1858   MCV 89 09/03/2017 1558   MCH 29.0 05/08/2019 1858   MCHC 29.7 (L) 05/08/2019 1858   RDW 15.4 05/08/2019 1858   RDW 14.4 09/03/2017 1558   LYMPHSABS 1.0 05/08/2019 1858   MONOABS 0.4 05/08/2019 1858   EOSABS 0.1 05/08/2019 1858   BASOSABS 0.0 05/08/2019 1858    No results found.  Mina Marble Carlos, DO 05/08/2019, 8:55 PM PGY-2, [place] Intern pager: [phone], text pages welcome

## 2019-05-08 NOTE — Assessment & Plan Note (Addendum)
Patient on chronic Eliquis.  No history of colonoscopy.  Does have history of CKD stage IV and this could be patient's new baseline however previously hemoglobin ~11.0 and then 1 month ago when symptoms started hemoglobin dropped to 8.3. Today POCT hemoglobin 7.3 initially and 7.7 on recheck here in office. Patient with significant history of CAD with bilateral carotid artery stenosis and A. fib.  Patient symptomatic feeling fatigued and sleeping more.  Denies any chest pain or shortness of breath.  Discussed outpatient management versus admitting to the hospital given his decreased in hemoglobin with history of blood in stool in the past week.  Patient and daughter-in-law would like for patient to be admitted in order to receive blood in GI consult.   Called for direct admission to med-surg with telemetry for 24-48 hours. I let inpatient team know.  Patient will need type and screen, CBC, iron studies, folate and B12 prior to receiving blood products.  Given history of bright red blood last week and no prior colonoscopy patient would also benefit from GI consult.  Likely that patient's fatigue is 2/2 worsening anemia but may need further work up if it does not resolve after treating anemia.

## 2019-05-08 NOTE — Progress Notes (Signed)
NEW ADMISSION NOTE New Admission Note:   Arrival Method: wheelchair  Mental Orientation: alert and oriented x4  Telemetry: NA Assessment: Completed Skin: dry and intact  IV: NA Pain: 0 Safety Measures: Safety Fall Prevention Plan has been given, discussed and signed Admission: Completed [facility] Orientation: Patient has been orientated to the room, unit and staff.  Family: at bedside   Orders have been reviewed and implemented. Will continue to monitor the patient. Call light has been placed within reach and bed alarm has been activated.   Baldo Ash, RN

## 2019-05-09 ENCOUNTER — Other Ambulatory Visit: Payer: Self-pay

## 2019-05-09 DIAGNOSIS — E559 Vitamin D deficiency, unspecified: Secondary | ICD-10-CM | POA: Diagnosis not present

## 2019-05-09 DIAGNOSIS — K635 Polyp of colon: Secondary | ICD-10-CM | POA: Diagnosis not present

## 2019-05-09 DIAGNOSIS — D490 Neoplasm of unspecified behavior of digestive system: Secondary | ICD-10-CM | POA: Diagnosis not present

## 2019-05-09 DIAGNOSIS — H409 Unspecified glaucoma: Secondary | ICD-10-CM | POA: Diagnosis not present

## 2019-05-09 DIAGNOSIS — R5383 Other fatigue: Secondary | ICD-10-CM | POA: Diagnosis present

## 2019-05-09 DIAGNOSIS — K2950 Unspecified chronic gastritis without bleeding: Secondary | ICD-10-CM | POA: Diagnosis not present

## 2019-05-09 DIAGNOSIS — Z7901 Long term (current) use of anticoagulants: Secondary | ICD-10-CM | POA: Diagnosis not present

## 2019-05-09 DIAGNOSIS — C188 Malignant neoplasm of overlapping sites of colon: Secondary | ICD-10-CM | POA: Diagnosis not present

## 2019-05-09 DIAGNOSIS — N261 Atrophy of kidney (terminal): Secondary | ICD-10-CM | POA: Diagnosis not present

## 2019-05-09 DIAGNOSIS — C187 Malignant neoplasm of sigmoid colon: Secondary | ICD-10-CM | POA: Diagnosis not present

## 2019-05-09 DIAGNOSIS — E611 Iron deficiency: Secondary | ICD-10-CM | POA: Diagnosis not present

## 2019-05-09 DIAGNOSIS — K6389 Other specified diseases of intestine: Secondary | ICD-10-CM | POA: Diagnosis not present

## 2019-05-09 DIAGNOSIS — N184 Chronic kidney disease, stage 4 (severe): Secondary | ICD-10-CM | POA: Diagnosis not present

## 2019-05-09 DIAGNOSIS — K625 Hemorrhage of anus and rectum: Secondary | ICD-10-CM | POA: Diagnosis not present

## 2019-05-09 DIAGNOSIS — C434 Malignant melanoma of scalp and neck: Secondary | ICD-10-CM | POA: Diagnosis not present

## 2019-05-09 DIAGNOSIS — Z66 Do not resuscitate: Secondary | ICD-10-CM | POA: Diagnosis not present

## 2019-05-09 DIAGNOSIS — I480 Paroxysmal atrial fibrillation: Secondary | ICD-10-CM | POA: Diagnosis not present

## 2019-05-09 DIAGNOSIS — Z8673 Personal history of transient ischemic attack (TIA), and cerebral infarction without residual deficits: Secondary | ICD-10-CM | POA: Diagnosis not present

## 2019-05-09 DIAGNOSIS — N179 Acute kidney failure, unspecified: Secondary | ICD-10-CM | POA: Diagnosis not present

## 2019-05-09 DIAGNOSIS — J984 Other disorders of lung: Secondary | ICD-10-CM | POA: Diagnosis not present

## 2019-05-09 DIAGNOSIS — Z681 Body mass index (BMI) 19 or less, adult: Secondary | ICD-10-CM | POA: Diagnosis not present

## 2019-05-09 DIAGNOSIS — Z20822 Contact with and (suspected) exposure to covid-19: Secondary | ICD-10-CM | POA: Diagnosis not present

## 2019-05-09 DIAGNOSIS — D631 Anemia in chronic kidney disease: Secondary | ICD-10-CM | POA: Diagnosis not present

## 2019-05-09 DIAGNOSIS — D649 Anemia, unspecified: Secondary | ICD-10-CM

## 2019-05-09 DIAGNOSIS — R64 Cachexia: Secondary | ICD-10-CM | POA: Diagnosis not present

## 2019-05-09 DIAGNOSIS — K648 Other hemorrhoids: Secondary | ICD-10-CM | POA: Diagnosis not present

## 2019-05-09 DIAGNOSIS — K922 Gastrointestinal hemorrhage, unspecified: Secondary | ICD-10-CM | POA: Diagnosis not present

## 2019-05-09 DIAGNOSIS — C184 Malignant neoplasm of transverse colon: Secondary | ICD-10-CM | POA: Diagnosis not present

## 2019-05-09 DIAGNOSIS — K219 Gastro-esophageal reflux disease without esophagitis: Secondary | ICD-10-CM | POA: Diagnosis not present

## 2019-05-09 DIAGNOSIS — D125 Benign neoplasm of sigmoid colon: Secondary | ICD-10-CM | POA: Diagnosis not present

## 2019-05-09 DIAGNOSIS — K5730 Diverticulosis of large intestine without perforation or abscess without bleeding: Secondary | ICD-10-CM | POA: Diagnosis not present

## 2019-05-09 DIAGNOSIS — K449 Diaphragmatic hernia without obstruction or gangrene: Secondary | ICD-10-CM | POA: Diagnosis not present

## 2019-05-09 DIAGNOSIS — I6523 Occlusion and stenosis of bilateral carotid arteries: Secondary | ICD-10-CM | POA: Diagnosis not present

## 2019-05-09 DIAGNOSIS — I482 Chronic atrial fibrillation, unspecified: Secondary | ICD-10-CM | POA: Diagnosis not present

## 2019-05-09 DIAGNOSIS — E785 Hyperlipidemia, unspecified: Secondary | ICD-10-CM | POA: Diagnosis not present

## 2019-05-09 DIAGNOSIS — I700 Atherosclerosis of aorta: Secondary | ICD-10-CM | POA: Diagnosis not present

## 2019-05-09 LAB — BASIC METABOLIC PANEL
Anion gap: 6 (ref 5–15)
BUN: 33 mg/dL — ABNORMAL HIGH (ref 8–23)
CO2: 23 mmol/L (ref 22–32)
Calcium: 8.8 mg/dL — ABNORMAL LOW (ref 8.9–10.3)
Chloride: 112 mmol/L — ABNORMAL HIGH (ref 98–111)
Creatinine, Ser: 2.76 mg/dL — ABNORMAL HIGH (ref 0.61–1.24)
GFR calc Af Amer: 25 mL/min — ABNORMAL LOW (ref >60)
GFR calc non Af Amer: 21 mL/min — ABNORMAL LOW (ref >60)
Glucose, Bld: 84 mg/dL (ref 70–99)
Potassium: 4.3 mmol/L (ref 3.5–5.1)
Sodium: 141 mmol/L (ref 135–145)

## 2019-05-09 LAB — HEMOGLOBIN AND HEMATOCRIT, BLOOD
HCT: 30.2 % — ABNORMAL LOW (ref 39.0–52.0)
Hemoglobin: 9.5 g/dL — ABNORMAL LOW (ref 13.0–17.0)

## 2019-05-09 LAB — CBC
HCT: 26.3 % — ABNORMAL LOW (ref 39.0–52.0)
Hemoglobin: 7.9 g/dL — ABNORMAL LOW (ref 13.0–17.0)
MCH: 28.9 pg (ref 26.0–34.0)
MCHC: 30 g/dL (ref 30.0–36.0)
MCV: 96.3 fL (ref 80.0–100.0)
Platelets: 213 10*3/uL (ref 150–400)
RBC: 2.73 MIL/uL — ABNORMAL LOW (ref 4.22–5.81)
RDW: 15.4 % (ref 11.5–15.5)
WBC: 5.8 10*3/uL (ref 4.0–10.5)
nRBC: 0 % (ref 0.0–0.2)

## 2019-05-09 LAB — VITAMIN B12: Vitamin B-12: 245 pg/mL (ref 180–914)

## 2019-05-09 LAB — PREPARE RBC (CROSSMATCH)

## 2019-05-09 LAB — OCCULT BLOOD X 1 CARD TO LAB, STOOL: Fecal Occult Bld: POSITIVE — AB

## 2019-05-09 MED ORDER — ONDANSETRON HCL 4 MG/2ML IJ SOLN
4.0000 mg | Freq: Three times a day (TID) | INTRAMUSCULAR | Status: DC | PRN
  Administered 2019-05-09: 4 mg via INTRAVENOUS
  Filled 2019-05-09: qty 2

## 2019-05-09 MED ORDER — PEG-KCL-NACL-NASULF-NA ASC-C 100 G PO SOLR
0.5000 | Freq: Once | ORAL | Status: AC
  Administered 2019-05-10: 100 g via ORAL

## 2019-05-09 MED ORDER — PEG-KCL-NACL-NASULF-NA ASC-C 100 G PO SOLR: 1.0000 | Freq: Once | ORAL | Status: DC

## 2019-05-09 MED ORDER — PEG-KCL-NACL-NASULF-NA ASC-C 100 G PO SOLR
0.5000 | Freq: Once | ORAL | Status: AC
  Administered 2019-05-09: 100 g via ORAL
  Filled 2019-05-09: qty 1

## 2019-05-09 MED ORDER — FERROUS SULFATE 325 (65 FE) MG PO TABS
325.0000 mg | ORAL_TABLET | Freq: Two times a day (BID) | ORAL | Status: DC
  Administered 2019-05-09 – 2019-05-12 (×4): 325 mg via ORAL
  Filled 2019-05-09 (×4): qty 1

## 2019-05-09 MED ORDER — ONDANSETRON 4 MG PO TBDP
4.0000 mg | ORAL_TABLET | Freq: Once | ORAL | Status: DC
  Filled 2019-05-09: qty 1

## 2019-05-09 MED ORDER — POLYETHYLENE GLYCOL 3350 17 GM/SCOOP PO POWD
0.5000 | Freq: Once | ORAL | Status: AC
  Administered 2019-05-09: 127.5 g via ORAL
  Filled 2019-05-09: qty 255

## 2019-05-09 MED ORDER — SODIUM CHLORIDE 0.9% IV SOLUTION: Freq: Once | INTRAVENOUS | Status: AC

## 2019-05-09 NOTE — Evaluation (Signed)
Physical Therapy Evaluation Patient Details Name: Keith [name] MRN: [id number] DOB: 01-31-1940 Today's Date: 05/09/2019   History of Present Illness  Pt is a 79 y.o. male presenting from [facility] with progressive fatigue and anemia.   Pt has received 1 unit PRBC with hgb of 7.9. PMH is significant for CKD IV, bilateral carotid artery stenosis L>R, CVA in 11/2016, GERD, HLD, h/o hypotension, iron deficiency, vitamin D deficiency, underweight, SAH, paroxysmal a-fib, multiple melanoma's of the scalp (March and April 2019), h/o urinary outlet obstruction s/p transurethral vaporization of the prostate in 2012.  Clinical Impression  Pt was admitted to hospital due to fatigue and anemia.  He has received 1 unit of PRBC and reports feeling better.  Pt was able to demonstrate safe transfers, gait, and stairs at baseline level.  Pt scored a 21 on the DGI indicating low fall risk.  Pts blood pressures did change with positions, but recovered with time and pt was asymptomatic.  Pt does not have further acute PT needs.  Encourage ambulation with nursing.     Follow Up Recommendations No PT follow up    Equipment Recommendations  None recommended by PT    Recommendations for Other Services       Precautions / Restrictions Precautions Precautions: Fall Restrictions Weight Bearing Restrictions: No      Mobility  Bed Mobility Overal bed mobility: Independent             General bed mobility comments: Independent without use of bed rails   Transfers Overall transfer level: Needs assistance Equipment used: None Transfers: Sit to/from Stand Sit to Stand: Supervision         General transfer comment: Supervision to ensure safety due to fluctuating BP   Ambulation/Gait Ambulation/Gait assistance: Supervision Gait Distance (Feet): 400 Feet Assistive device: None Gait Pattern/deviations: WFL(Within Functional Limits) Gait velocity: normal   General Gait Details: steady, no  LOB  Stairs Stairs: Yes Stairs assistance: Min guard Stair Management: One rail Right;Alternating pattern Number of Stairs: 6 General stair comments: steady; no LOB  Wheelchair Mobility    Modified Rankin (Stroke Patients Only)       Balance Overall balance assessment: Independent   Sitting balance-[name] Scale: Normal       Standing balance-[name] Scale: Normal Standing balance comment: Pt was able to turn circle, stand feet together eyes open/closed, pick item from floor, and perform functional task without LOB                 Standardized Balance Assessment Standardized Balance Assessment : Dynamic Gait Index   Dynamic Gait Index Level Surface: Normal Change in Gait Speed: Normal Gait with Horizontal Head Turns: Normal Gait with Vertical Head Turns: Normal Gait and Pivot Turn: Normal Step Over Obstacle: Mild Impairment Step Around Obstacles: Mild Impairment Steps: Mild Impairment Total Score: 21       Pertinent Vitals/Pain Pain Assessment: No/denies pain    Home Living Family/patient expects to be discharged to:: Private residence Living Arrangements: Alone Available Help at Discharge: Family;Available PRN/intermittently(reports son can check on as needed) Type of Home: House Home Access: Stairs to enter   [employer] of Steps: 1  Home Layout: One level Home Equipment: Grab bars - tub/shower      Prior Function Level of Independence: Independent         Comments: Independent with ADLs, iADLs, and mobility without AD. Pt was walking dog 1 mile daily. Pt's son's girfriend was driving pt to appointments, assisting with cleaning  and shopping as needed.  Denies hx of falls     Hand Dominance        Extremity/Trunk Assessment   Upper Extremity Assessment Upper Extremity Assessment: Overall WFL for tasks assessed    Lower Extremity Assessment Lower Extremity Assessment: Overall WFL for tasks assessed(ROM WFL and MMT grossly 5/5  throughout)    Cervical / Trunk Assessment Cervical / Trunk Assessment: Normal  Communication   Communication: No difficulties  Cognition Arousal/Alertness: Awake/alert Behavior During Therapy: WFL for tasks assessed/performed Overall Cognitive Status: Within Functional Limits for tasks assessed                                        General Comments   Orthostatic BPs  Supine 135/52 and HR 65  Sitting 110/52 and HR 74  Sitting after 3 min 146/52 and HR 68  Standing 127/49 and HR 83  Standing after 3 min 119/65 and HR 88   Standing after walking   134/51 and HR 82  Pt asymptomatic in all positions.  Educated on slow transitions between positions due to fluctuating Blood Pressures.  Exercises     Assessment/Plan    PT Assessment Patent does not need any further PT services  PT Problem List         PT Treatment Interventions      PT Goals (Current goals can be found in the Care Plan section)  Acute Rehab PT Goals PT Goal Formulation: All assessment and education complete, DC therapy    Frequency     Barriers to discharge        Co-evaluation               AM-PAC PT "6 Clicks" Mobility  Outcome Measure Help needed turning from your back to your side while in a flat bed without using bedrails?: None Help needed moving from lying on your back to sitting on the side of a flat bed without using bedrails?: None Help needed moving to and from a bed to a chair (including a wheelchair)?: None Help needed standing up from a chair using your arms (e.g., wheelchair or bedside chair)?: None Help needed to walk in hospital room?: None Help needed climbing 3-5 steps with a railing? : None 6 Click Score: 24    End of Session Equipment Utilized During Treatment: Gait belt Activity Tolerance: Patient tolerated treatment well Patient left: in bed;with call bell/phone within reach;with bed alarm set Nurse Communication: Mobility status      Time:  4656-8127 PT Time Calculation (min) (ACUTE ONLY): 33 min   Charges:   PT Evaluation $PT Eval Moderate Complexity: 1 Mod          Maggie Font, PT Acute Rehab Services Pager [phone] [facility] Rehab [phone] [facility] 05/09/2019, 2:18 PM

## 2019-05-09 NOTE — Progress Notes (Signed)
FPTS Interim Progress Note  Spoke to patient's son, Clois Treanor and provided update.  Answered all questions.  He notes that he is very appreciative of the update and is available anytime if we need to call.  Danna Hefty, DO 05/09/2019, 9:57 AM PGY-2, [place] Medicine Service pager [phone]

## 2019-05-09 NOTE — Progress Notes (Addendum)
Family Medicine Teaching Service Daily Progress Note Intern Pager: [phone]  Patient name: Keith [name] Medical record number: [id number] Date of birth: 05-12-1940 Age: 79 y.o. Gender: male  Primary Care Provider: Bonnita Hollow, MD Consultants: GI Code Status: DNR  Pt Overview and Major Events to Date:  04/22- Admitted  Assessment and Plan: Keith [name] is a 79 y.o. male presenting from [facility] . PMH is significant for CKD IV, bilateral carotid artery stenosis L>R, CVA in 11/2016, GERD, HLD, h/o hypotension, iron deficiency, vitamin D deficiency, underweight, SAH, paroxysmal a-fib, multiple melanoma's of the scalp (March and April 2019), h/o urinary outlet obstruction s/p transurethral vaporization of the prostate in 2012.   Worsening Fatigue  Symptomatic Anemia: Patient reports no worsening fatigue.  Denies any headache, dizziness or SOB. No obvious signs of bleeding. Hbg 7.9 s/p transfusion 1u PRBCs.  Anemia panel significant for low iron and sat ratio. Folate pending.  Exam benign.   -GI consult, appreciate recommendations. -Transfusion threshold <8 -Give PRBCs 1 unit -h&h post transfusion -Ferrous sulfate 325 mg BID -f/u FOBT -Continue cardiac monitoring -Continue to hold Eliquis -Avoid NSAIDS -SCD's -Continue IV Protonix  H/o CVA  Bilateral Carotid Stenosis  Questionable h/o SAH  Paroxysmal A-fib: Chronic.Stable. Home medication Eliquis  -Continue to hold Eliquis given symptomatic anemia -Continue cardiac monitoring  HLD:  Chronic. Stable -Continue home medication   CKD IV: Creatinine 2.76 today.  Elevated from admission 2.53.  Baseline 2.2-2.4 -avoid nephrotoxic agents.  FEN/GI:  - NPO then Heart Healthy, Protonix Prophylaxis: -SCDs   Disposition: Home when medically stable  Subjective:  No acute events overnight.  Denies any worsening fatigue, shortness of breath, dizziness and reports no signs of bleeding.  Reports feeling better and having  a Stenner more energy since initial transfusion.    Objective: Temp:  [98 F (36.7 C)-98.9 F (37.2 C)] 98 F (36.7 C) (04/23 0449) Pulse Rate:  [64-87] 66 (04/23 0449) Resp:  [16-18] 18 (04/23 0449) BP: (104-145)/(46-76) 121/54 (04/23 0449) SpO2:  [98 %-100 %] 98 % (04/23 0449) Weight:  [51.7 kg-52.6 kg] 51.7 kg (04/23 0500) Physical Exam: General: 79 y.o male in no acute distress Cardiovascular: RRR, no murmurs or gallops appreciated Respiratory: CTAB no wheezing or crackles heard. No IWOB Abdomen: soft, non tender, non distended.  BS present Extremities: no lower extremity edema  Laboratory: Recent Labs  Lab 05/08/19 0707 05/08/19 1858 05/09/19 0435  WBC  --  6.8 5.8  HGB 7.7* 7.0* 7.9*  HCT  --  23.6* 26.3*  PLT  --  238 213   Recent Labs  Lab 05/08/19 1858 05/09/19 0435  NA 136 141  K 3.9 4.3  CL 104 112*  CO2 21* 23  BUN 33* 33*  CREATININE 2.53* 2.76*  CALCIUM 8.6* 8.8*  PROT 5.9*  --   BILITOT 0.2*  --   ALKPHOS 64  --   ALT 9  --   AST 17  --   GLUCOSE 155* 84    Imaging/Diagnostic Tests:   Carollee Leitz, MD 05/09/2019, 7:39 AM PGY-1, [place] Intern pager: [phone], text pages welcome

## 2019-05-09 NOTE — H&P (View-Only) (Signed)
Referring Provider:  ? Primary Care Physician:  Bonnita Hollow, MD Primary Gastroenterologist:  Althia Forts  Reason for Consultation:  Symptomatic anemia  HPI: Keith [name] is a 79 y.o. male with past medical history significant for chronic kidney disease stage IV, bilateral carotid artery stenosis left greater than right, CVA in November 2018, GERD, hyperlipidemia, history of hypotension, vitamin D deficiency, paroxysmal atrial fibrillation on Eliquis, multiple melanomas of the scalp, history of urinary outlet obstruction status post transurethral evaporation of the prostate in 2012.  He presented to [facility] from the family medicine clinic on 4/22 after being seen and evaluated for complaints of worsening fatigue.  He admitted to some recent bright red blood per rectum in the setting of Eliquis use.  Hemoglobin in the 7 g range.  Was advised to present to the ED for admission and evaluation to expedite work-up.  GI has been consulted.  He has never had GI evaluation or colonoscopy in the past.  He received 1 unit of packed red blood cells for Hgb nadir of 7.0 grams and then repeat Hgb was 7.9 grams this AM.  He then received a second unit of PRBC's with repeat Hgb 9.5 grams.  Vitamin B12 level is low normal.  Folate levels pending.  Iron studies with a low normal ferritin at 31, serum iron low at 15, and iron saturation low at 4%.  Hemoccult has not yet been performed.  Eliquis is on hold with last dose 4/22 AM.  He was hemoccult positive here.  He reports seeing occasional bright red blood with bowel movements, but then 2 weeks ago for 2 days he had more blood than he was used to seeing. Says that it splashed in the toilet bowl, but then resolved.  He says that he moves his bowels regularly without issues even with iron on board.  No abdominal pain or really any other significant GI complaints otherwise.   Past Medical History:  Diagnosis Date  . Cataract    planning surgery  .  Chronic kidney disease    "weak Kidneys"  . Complication of anesthesia    patient expressed that he has difficulty post anesthesia with swallowing, chewing, and talking after surgery  . GERD (gastroesophageal reflux disease)    tums prn  . Glaucoma   . History of hiatal hernia   . Hypothyroidism   . Hypothyroidism, postsurgical 11/20/2016  . Left middle cerebral artery stroke ([place]) 11/23/2016  . Melanoma ([place])    scalp  . Melanoma of scalp ([place]) 04/23/2017  . Obstructive uropathy   . PONV (postoperative nausea and vomiting) 03/20/2017  . Stroke ([place]) 11/2016   speech and right side at time of stroke 04/20/17- all function return to normal  . Underweight 01/24/2018    Past Surgical History:  Procedure Laterality Date  . APPLICATION OF A-CELL OF HEAD/NECK N/A 04/23/2017   Procedure: APPLICATION OF INTREGRA;  Surgeon: Irene Limbo, MD;  Location: [place];  Service: Plastics;  Laterality: N/A;  . CATARACT EXTRACTION    . EXCISION MASS HEAD N/A 03/20/2017   Procedure: EXCISION scalp lesion;  Surgeon: Irene Limbo, MD;  Location: [place];  Service: Plastics;  Laterality: N/A;  EXCISION scalp lesion  . EYE SURGERY    . IR ANGIO INTRA EXTRACRAN SEL COM CAROTID INNOMINATE BILAT MOD SED  11/23/2016  . IR ANGIO VERTEBRAL SEL VERTEBRAL UNI L MOD SED  11/23/2016  . LOOP RECORDER INSERTION N/A 11/23/2016   Procedure: LOOP RECORDER INSERTION;  Surgeon: [name], MD;  Location: [place] [facility];  Service: Cardiovascular;  Laterality: N/A;  . MASS EXCISION N/A 04/23/2017   Procedure: EXCISION MALIGNANT LESION OF SCALP;  Surgeon: Irene Limbo, MD;  Location: [place];  Service: Plastics;  Laterality: N/A;  . MELANOMA EXCISION  04/23/2017   BILATERAL SUPERFICIAL PAROTIDECTOMY/NECK SENTINEL NODE BIOPSY WITH FROZEN SECTION; LEFT SELECTIVE NECK DISSECTION  . PAROTIDECTOMY Bilateral 04/23/2017   Procedure: BILATERAL SUPERFICIAL PAROTIDECTOMY/NECK SENTINEL NODE BIOPSY WITH FROZEN SECTION; LEFT SELECTIVE  NECK DISSECTION;  Surgeon: [name], MD;  Location: [place];  Service: ENT;  Laterality: Bilateral;  . SKIN GRAFT SPLIT THICKNESS HEAD / NECK  06/04/2017   SKIN GRAFT SPLIT THICKNESS TO SCALP FROM RIGHT OR LEFT THIGH POSSIBLE A CELL TO DONOR SITE  . SKIN SPLIT GRAFT N/A 06/04/2017   Procedure: SKIN GRAFT SPLIT THICKNESS TO SCALP FROM RIGHT OR LEFT THIGH POSSIBLE A CELL TO DONOR SITE;  Surgeon: Irene Limbo, MD;  Location: [place];  Service: Plastics;  Laterality: N/A;  . THYROIDECTOMY    . TONSILLECTOMY      Prior to Admission medications   Medication Sig Start Date End Date Taking? Authorizing Provider  atorvastatin (LIPITOR) 40 MG tablet TAKE 1 TABLET (40 MG TOTAL) BY MOUTH DAILY AT 6 PM. 03/28/19   Bonnita Hollow, MD  brimonidine-timolol (COMBIGAN) 0.2-0.5 % ophthalmic solution Apply 1 drop to eye 2 (two) times daily. 04/28/17   [provider]  Dapsone 5 % topical gel APPLY 1 APPLICATION DAILY TO SCALP AND COVER 02/08/18   [provider]  Elastic Bandages & Supports ([place]) [place] 1 each by Does not apply route daily. Patient not taking: Reported on 01/23/2018 12/10/17   Bonnita Hollow, MD  ELIQUIS 2.5 MG TABS tablet TAKE 1 TABLET BY MOUTH TWICE A DAY 01/03/19   Bonnita Hollow, MD  ergocalciferol (VITAMIN D2) 1.25 MG (50000 UT) capsule Take by mouth.    [provider]  erythromycin ophthalmic ointment Place 1 application into the right eye once.    [provider]  famotidine (PEPCID) 20 MG tablet Take 1 tablet (20 mg total) by mouth 2 (two) times daily. 02/18/19 06/18/19  Bonnita Hollow, MD  ferrous sulfate 325 (65 FE) MG tablet Take by mouth.    [provider]  Multiple Vitamin (MULTIVITAMIN) capsule Take by mouth.    [provider]  mupirocin ointment (BACTROBAN) 2 % Place 1 application into the nose daily.    [provider]  timolol (TIMOPTIC) 0.25 % ophthalmic solution  04/29/19   [provider]  timolol (TIMOPTIC) 0.5 % ophthalmic solution INSTILL 1 DROP INTO BOTH EYES TWICE A DAY 11/24/17   [provider]    Current Facility-Administered Medications  Medication Dose Route Frequency Provider Last Rate Last Admin  . atorvastatin (LIPITOR) tablet 40 mg  40 mg Oral q1800 Mullis, Kiersten P, DO      . ferrous sulfate tablet 325 mg  325 mg Oral BID WC Carollee Leitz, MD      . pantoprazole (PROTONIX) injection 40 mg  40 mg Intravenous Q12H Mullis, Kiersten P, DO   40 mg at 05/09/19 8527    Allergies as of 05/08/2019 - Review Complete 05/08/2019  Allergen Reaction Noted  . Penicillins Other (See Comments) 06/24/2014    Family History  Problem Relation Age of Onset  . Lung cancer Father   . Diabetes Brother     Social History   Socioeconomic  History  . Marital status: Widowed    Spouse name: Not on file  . Number of children: 1  . Years of education: 29  . Highest education level: 12th grade  Occupational History  . Occupation: laborer    Comment: retired  Tobacco Use  . Smoking status: Former Smoker    Packs/day: 2.00    Years: 32.00    Pack years: 64.00    Types: Cigarettes    Quit date: 1983    Years since quitting: 38.3  . Smokeless tobacco: Never Used  . Tobacco comment: No plans to start  Substance and Sexual Activity  . Alcohol use: No  . Drug use: No  . Sexual activity: Not Currently  Other Topics Concern  . Not on file  Social History Narrative   Lives alone in house. One level house. Has one step at back. Smoke alarms. Throw rugs have a back on them. Grab bar at tub.   Has one dog "[place]", walks her several times a day. Sees son every day.      Eats a good variety of foods, doesn't like chicken, eats vegetables and fruits. Drinks sodas mostly. Now drinking Ensure, water, juice.      Has made an appt to get dentures fixed.   Wears seatbelt in car.      Social Determinants of Health   Financial Resource Strain:   .  Difficulty of Paying Living Expenses:   Food Insecurity:   . Worried About Charity fundraiser in the Last Year:   . Arboriculturist in the Last Year:   Transportation Needs:   . Film/video editor (Medical):   Marland Kitchen Lack of Transportation (Non-Medical):   Physical Activity:   . Days of Exercise per Week:   . Minutes of Exercise per Session:   Stress:   . Feeling of Stress :   Social Connections:   . Frequency of Communication with Friends and Family:   . Frequency of Social Gatherings with Friends and Family:   . Attends Religious Services:   . Active Member of Clubs or Organizations:   . Attends Archivist Meetings:   Marland Kitchen Marital Status:   Intimate Partner Violence:   . Fear of Current or Ex-Partner:   . Emotionally Abused:   Marland Kitchen Physically Abused:   . Sexually Abused:     Review of Systems: ROS is O/W negative except as mentioned in HPI.  Physical Exam: Vital signs in last 24 hours: Temp:  [97.3 F (36.3 C)-98.9 F (37.2 C)] 97.8 F (36.6 C) (04/23 1243) Pulse Rate:  [64-92] 66 (04/23 1243) Resp:  [16-20] 20 (04/23 1243) BP: (104-145)/(44-76) 133/57 (04/23 1243) SpO2:  [98 %-100 %] 99 % (04/23 1243) Weight:  [51.7 kg-52.6 kg] 51.7 kg (04/23 0500) Last BM Date: 05/08/19 General:  Alert, Well-developed, well-nourished, pleasant and cooperative in NAD Head:  Normocephalic and atraumatic.  Has bandage on head where melanoma was excised. Eyes:  Sclera clear, no icterus.  Conjunctiva pink. Ears:  Normal auditory acuity. Mouth:  No deformity or lesions.   Lungs:  Clear throughout to auscultation.  No wheezes, crackles, or rhonchi.  Heart:  Regular rate and rhythm; no murmurs, clicks, rubs, or gallops. Abdomen:  Soft, non-distended.  BS present.  Non-tender.   Rectal:  Deferred.  Heme positive by lab. Msk:  Symmetrical without gross deformities. Pulses:  Normal pulses noted. Extremities:  Without clubbing or edema. Neurologic:  Alert and  oriented x  4;  grossly  normal neurologically. Skin:  Intact without significant lesions or rashes. Psych:  Alert and cooperative. Normal mood and affect.  Intake/Output from previous day: 04/22 0701 - 04/23 0700 In: 603 [P.O.:240; I.V.:55; Blood:308] Out: 0  Intake/Output this shift: Total I/O In: 316 [Blood:316] Out: 300 [Urine:300]  Lab Results: Recent Labs    05/08/19 0707 05/08/19 1858 05/09/19 0435  WBC  --  6.8 5.8  HGB 7.7* 7.0* 7.9*  HCT  --  23.6* 26.3*  PLT  --  238 213   BMET Recent Labs    05/08/19 1858 05/09/19 0435  NA 136 141  K 3.9 4.3  CL 104 112*  CO2 21* 23  GLUCOSE 155* 84  BUN 33* 33*  CREATININE 2.53* 2.76*  CALCIUM 8.6* 8.8*   LFT Recent Labs    05/08/19 1858  PROT 5.9*  ALBUMIN 3.0*  AST 17  ALT 9  ALKPHOS 64  BILITOT 0.2*   IMPRESSION:  *Symptomatic normocytic anemia: Hgb nadir 7.0 grams now s/p 1 unit PRBC's with increase to 7.9 grams this AM then received 1 more unit with repeat at 9.5 grams.  Hemoglobin in November 2019 was 12.3 g.  8.3 g on March 26, 2019.  MCV is normal.  Vitamin B12 levels are normal.  Ferritin low normal with low iron saturation and serum iron.  Likely component of anemia of chronic disease, but iron deficient as well.  has been on iron BID for quite some time.  Reports rectal bleeding x 2 days a couple of weeks ago.  Stools always dark because of iron supplements.  Heme positive by lab.  Never had GI evaluation in the past. *History of atrial fibrillation, history of CVA, history of bilateral carotid artery stenosis: On Eliquis.  Currently on hold with last dose 4/22 AM.  PLAN: -Monitor Hgb and transfuse further prn. -Will plan for EGD and colonoscopy on 4/24.  Will give 1/2 miralax prep now and then start moviprep later this evening to be sure that he is prepped well.   Laban Emperor. Zehr  05/09/2019, 1:56 PM    Attending physician's note   I have taken an interval history, reviewed the chart and examined the patient. I agree with  the Advanced Practitioner's note, impression and recommendations.   GI bleed- likely LGI. R/O UGI. Hb 7 to 9.5 post 2U PRBC. GERD A Fib on Eliquis (last dose 4/22). H/O CVA 11/2016 Associated CKD4 with anemia of chronic disease,  Melanoma of scalp s/p resection. Neg NCCT chest/A/P 05/2018  Plan: -Agree to hold Eliquis. -EGD/colon in AM.  I explained risks and benefits. -Trend CBC. Keep Hb>7   Carmell Austria, MD [place] [phone].

## 2019-05-09 NOTE — Consult Note (Addendum)
Referring Provider:  ? Primary Care Physician:  Bonnita Hollow, MD Primary Gastroenterologist:  Althia Forts  Reason for Consultation:  Symptomatic anemia  HPI: Keith [name] is a 79 y.o. male with past medical history significant for chronic kidney disease stage IV, bilateral carotid artery stenosis left greater than right, CVA in November 2018, GERD, hyperlipidemia, history of hypotension, vitamin D deficiency, paroxysmal atrial fibrillation on Eliquis, multiple melanomas of the scalp, history of urinary outlet obstruction status post transurethral evaporation of the prostate in 2012.  He presented to [facility] from the family medicine clinic on 4/22 after being seen and evaluated for complaints of worsening fatigue.  He admitted to some recent bright red blood per rectum in the setting of Eliquis use.  Hemoglobin in the 7 g range.  Was advised to present to the ED for admission and evaluation to expedite work-up.  GI has been consulted.  He has never had GI evaluation or colonoscopy in the past.  He received 1 unit of packed red blood cells for Hgb nadir of 7.0 grams and then repeat Hgb was 7.9 grams this AM.  He then received a second unit of PRBC's with repeat Hgb 9.5 grams.  Vitamin B12 level is low normal.  Folate levels pending.  Iron studies with a low normal ferritin at 31, serum iron low at 15, and iron saturation low at 4%.  Hemoccult has not yet been performed.  Eliquis is on hold with last dose 4/22 AM.  He was hemoccult positive here.  He reports seeing occasional bright red blood with bowel movements, but then 2 weeks ago for 2 days he had more blood than he was used to seeing. Says that it splashed in the toilet bowl, but then resolved.  He says that he moves his bowels regularly without issues even with iron on board.  No abdominal pain or really any other significant GI complaints otherwise.   Past Medical History:  Diagnosis Date  . Cataract    planning surgery  .  Chronic kidney disease    "weak Kidneys"  . Complication of anesthesia    patient expressed that he has difficulty post anesthesia with swallowing, chewing, and talking after surgery  . GERD (gastroesophageal reflux disease)    tums prn  . Glaucoma   . History of hiatal hernia   . Hypothyroidism   . Hypothyroidism, postsurgical 11/20/2016  . 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 Melanoma ([place])    scalp  . Melanoma of scalp ([place]) 04/23/2017  . Obstructive uropathy   . PONV (postoperative nausea and vomiting) 03/20/2017  . Stroke ([place]) 11/2016   speech and right side at time of stroke 04/20/17- all function return to normal  . Underweight 01/24/2018    Past Surgical History:  Procedure Laterality Date  . APPLICATION OF A-CELL OF HEAD/NECK N/A 04/23/2017   Procedure: APPLICATION OF INTREGRA;  Surgeon: Irene Limbo, MD;  Location: [place];  Service: Plastics;  Laterality: N/A;  . CATARACT EXTRACTION    . EXCISION MASS HEAD N/A 03/20/2017   Procedure: EXCISION scalp lesion;  Surgeon: Irene Limbo, MD;  Location: [place];  Service: Plastics;  Laterality: N/A;  EXCISION scalp lesion  . EYE SURGERY    . IR ANGIO INTRA EXTRACRAN SEL COM CAROTID INNOMINATE BILAT MOD SED  11/23/2016  . IR ANGIO VERTEBRAL SEL VERTEBRAL UNI L MOD SED  11/23/2016  . LOOP RECORDER INSERTION N/A 11/23/2016   Procedure: LOOP RECORDER INSERTION;  Surgeon: [name], MD;  Location: [place] [facility];  Service: Cardiovascular;  Laterality: N/A;  . MASS EXCISION N/A 04/23/2017   Procedure: EXCISION MALIGNANT LESION OF SCALP;  Surgeon: Irene Limbo, MD;  Location: [place];  Service: Plastics;  Laterality: N/A;  . MELANOMA EXCISION  04/23/2017   BILATERAL SUPERFICIAL PAROTIDECTOMY/NECK SENTINEL NODE BIOPSY WITH FROZEN SECTION; LEFT SELECTIVE NECK DISSECTION  . PAROTIDECTOMY Bilateral 04/23/2017   Procedure: BILATERAL SUPERFICIAL PAROTIDECTOMY/NECK SENTINEL NODE BIOPSY WITH FROZEN SECTION; LEFT SELECTIVE  NECK DISSECTION;  Surgeon: [name], MD;  Location: [place];  Service: ENT;  Laterality: Bilateral;  . SKIN GRAFT SPLIT THICKNESS HEAD / NECK  06/04/2017   SKIN GRAFT SPLIT THICKNESS TO SCALP FROM RIGHT OR LEFT THIGH POSSIBLE A CELL TO DONOR SITE  . SKIN SPLIT GRAFT N/A 06/04/2017   Procedure: SKIN GRAFT SPLIT THICKNESS TO SCALP FROM RIGHT OR LEFT THIGH POSSIBLE A CELL TO DONOR SITE;  Surgeon: Irene Limbo, MD;  Location: [place];  Service: Plastics;  Laterality: N/A;  . THYROIDECTOMY    . TONSILLECTOMY      Prior to Admission medications   Medication Sig Start Date End Date Taking? Authorizing Provider  atorvastatin (LIPITOR) 40 MG tablet TAKE 1 TABLET (40 MG TOTAL) BY MOUTH DAILY AT 6 PM. 03/28/19   Bonnita Hollow, MD  brimonidine-timolol (COMBIGAN) 0.2-0.5 % ophthalmic solution Apply 1 drop to eye 2 (two) times daily. 04/28/17   [provider]  Dapsone 5 % topical gel APPLY 1 APPLICATION DAILY TO SCALP AND COVER 02/08/18   [provider]  Elastic Bandages & Supports ([place]) [place] 1 each by Does not apply route daily. Patient not taking: Reported on 01/23/2018 12/10/17   Bonnita Hollow, MD  ELIQUIS 2.5 MG TABS tablet TAKE 1 TABLET BY MOUTH TWICE A DAY 01/03/19   Bonnita Hollow, MD  ergocalciferol (VITAMIN D2) 1.25 MG (50000 UT) capsule Take by mouth.    [provider]  erythromycin ophthalmic ointment Place 1 application into the right eye once.    [provider]  famotidine (PEPCID) 20 MG tablet Take 1 tablet (20 mg total) by mouth 2 (two) times daily. 02/18/19 06/18/19  Bonnita Hollow, MD  ferrous sulfate 325 (65 FE) MG tablet Take by mouth.    [provider]  Multiple Vitamin (MULTIVITAMIN) capsule Take by mouth.    [provider]  mupirocin ointment (BACTROBAN) 2 % Place 1 application into the nose daily.    [provider]  timolol (TIMOPTIC) 0.25 % ophthalmic solution  04/29/19   [provider]  timolol (TIMOPTIC) 0.5 % ophthalmic solution INSTILL 1 DROP INTO BOTH EYES TWICE A DAY 11/24/17   [provider]    Current Facility-Administered Medications  Medication Dose Route Frequency Provider Last Rate Last Admin  . atorvastatin (LIPITOR) tablet 40 mg  40 mg Oral q1800 Mullis, Kiersten P, DO      . ferrous sulfate tablet 325 mg  325 mg Oral BID WC Carollee Leitz, MD      . pantoprazole (PROTONIX) injection 40 mg  40 mg Intravenous Q12H Mullis, Kiersten P, DO   40 mg at 05/09/19 8338    Allergies as of 05/08/2019 - Review Complete 05/08/2019  Allergen Reaction Noted  . Penicillins Other (See Comments) 06/24/2014    Family History  Problem Relation Age of Onset  . Lung cancer Father   . Diabetes Brother     Social History   Socioeconomic  History  . Marital status: Widowed    Spouse name: Not on file  . Number of children: 1  . Years of education: 37  . Highest education level: 12th grade  Occupational History  . Occupation: laborer    Comment: retired  Tobacco Use  . Smoking status: Former Smoker    Packs/day: 2.00    Years: 32.00    Pack years: 64.00    Types: Cigarettes    Quit date: 1983    Years since quitting: 38.3  . Smokeless tobacco: Never Used  . Tobacco comment: No plans to start  Substance and Sexual Activity  . Alcohol use: No  . Drug use: No  . Sexual activity: Not Currently  Other Topics Concern  . Not on file  Social History Narrative   Lives alone in house. One level house. Has one step at back. Smoke alarms. Throw rugs have a back on them. Grab bar at tub.   Has one dog "[place]", walks her several times a day. Sees son every day.      Eats a good variety of foods, doesn't like chicken, eats vegetables and fruits. Drinks sodas mostly. Now drinking Ensure, water, juice.      Has made an appt to get dentures fixed.   Wears seatbelt in car.      Social Determinants of Health   Financial Resource Strain:   .  Difficulty of Paying Living Expenses:   Food Insecurity:   . Worried About Charity fundraiser in the Last Year:   . Arboriculturist in the Last Year:   Transportation Needs:   . Film/video editor (Medical):   Marland Kitchen Lack of Transportation (Non-Medical):   Physical Activity:   . Days of Exercise per Week:   . Minutes of Exercise per Session:   Stress:   . Feeling of Stress :   Social Connections:   . Frequency of Communication with Friends and Family:   . Frequency of Social Gatherings with Friends and Family:   . Attends Religious Services:   . Active Member of Clubs or Organizations:   . Attends Archivist Meetings:   Marland Kitchen Marital Status:   Intimate Partner Violence:   . Fear of Current or Ex-Partner:   . Emotionally Abused:   Marland Kitchen Physically Abused:   . Sexually Abused:     Review of Systems: ROS is O/W negative except as mentioned in HPI.  Physical Exam: Vital signs in last 24 hours: Temp:  [97.3 F (36.3 C)-98.9 F (37.2 C)] 97.8 F (36.6 C) (04/23 1243) Pulse Rate:  [64-92] 66 (04/23 1243) Resp:  [16-20] 20 (04/23 1243) BP: (104-145)/(44-76) 133/57 (04/23 1243) SpO2:  [98 %-100 %] 99 % (04/23 1243) Weight:  [51.7 kg-52.6 kg] 51.7 kg (04/23 0500) Last BM Date: 05/08/19 General:  Alert, Well-developed, well-nourished, pleasant and cooperative in NAD Head:  Normocephalic and atraumatic.  Has bandage on head where melanoma was excised. Eyes:  Sclera clear, no icterus.  Conjunctiva pink. Ears:  Normal auditory acuity. Mouth:  No deformity or lesions.   Lungs:  Clear throughout to auscultation.  No wheezes, crackles, or rhonchi.  Heart:  Regular rate and rhythm; no murmurs, clicks, rubs, or gallops. Abdomen:  Soft, non-distended.  BS present.  Non-tender.   Rectal:  Deferred.  Heme positive by lab. Msk:  Symmetrical without gross deformities. Pulses:  Normal pulses noted. Extremities:  Without clubbing or edema. Neurologic:  Alert and  oriented x  4;  grossly  normal neurologically. Skin:  Intact without significant lesions or rashes. Psych:  Alert and cooperative. Normal mood and affect.  Intake/Output from previous day: 04/22 0701 - 04/23 0700 In: 603 [P.O.:240; I.V.:55; Blood:308] Out: 0  Intake/Output this shift: Total I/O In: 316 [Blood:316] Out: 300 [Urine:300]  Lab Results: Recent Labs    05/08/19 0707 05/08/19 1858 05/09/19 0435  WBC  --  6.8 5.8  HGB 7.7* 7.0* 7.9*  HCT  --  23.6* 26.3*  PLT  --  238 213   BMET Recent Labs    05/08/19 1858 05/09/19 0435  NA 136 141  K 3.9 4.3  CL 104 112*  CO2 21* 23  GLUCOSE 155* 84  BUN 33* 33*  CREATININE 2.53* 2.76*  CALCIUM 8.6* 8.8*   LFT Recent Labs    05/08/19 1858  PROT 5.9*  ALBUMIN 3.0*  AST 17  ALT 9  ALKPHOS 64  BILITOT 0.2*   IMPRESSION:  *Symptomatic normocytic anemia: Hgb nadir 7.0 grams now s/p 1 unit PRBC's with increase to 7.9 grams this AM then received 1 more unit with repeat at 9.5 grams.  Hemoglobin in November 2019 was 12.3 g.  8.3 g on March 26, 2019.  MCV is normal.  Vitamin B12 levels are normal.  Ferritin low normal with low iron saturation and serum iron.  Likely component of anemia of chronic disease, but iron deficient as well.  has been on iron BID for quite some time.  Reports rectal bleeding x 2 days a couple of weeks ago.  Stools always dark because of iron supplements.  Heme positive by lab.  Never had GI evaluation in the past. *History of atrial fibrillation, history of CVA, history of bilateral carotid artery stenosis: On Eliquis.  Currently on hold with last dose 4/22 AM.  PLAN: -Monitor Hgb and transfuse further prn. -Will plan for EGD and colonoscopy on 4/24.  Will give 1/2 miralax prep now and then start moviprep later this evening to be sure that he is prepped well.   Laban Emperor. Zehr  05/09/2019, 1:56 PM    Attending physician's note   I have taken an interval history, reviewed the chart and examined the patient. I agree with  the Advanced Practitioner's note, impression and recommendations.   GI bleed- likely LGI. R/O UGI. Hb 7 to 9.5 post 2U PRBC. GERD A Fib on Eliquis (last dose 4/22). H/O CVA 11/2016 Associated CKD4 with anemia of chronic disease,  Melanoma of scalp s/p resection. Neg NCCT chest/A/P 05/2018  Plan: -Agree to hold Eliquis. -EGD/colon in AM.  I explained risks and benefits. -Trend CBC. Keep Hb>7   Carmell Austria, MD [place] [phone].

## 2019-05-09 NOTE — Progress Notes (Signed)
OCCUPATIONAL THERAPY  Clinical Impressions: PTA, pt lives alone and was Independent in all ADLs, IADLs, and mobility without AD. Pt was active, walking his dog 0.5 miles daily. Pt presents with Supervision at most needed for LB ADLs and mobility to ensure safety due to fluctuating BP when transitioning from sit to stand. Pt ambulated to bathroom without AD, performed toilet transfer without AD at Supervision level with BP increasing to 133/61 with activity. Pt reported mild dizziness subsided after this activity. No OT needs anticipated at DC. Will continue to follow acutely to maximize safety and independence on DC.     05/09/19 0800  OT Visit Information  Last OT Received On 05/09/19  Assistance Needed +1  History of Present Illness Keith [name] is a 79 y.o. male presenting from [facility] . PMH is significant for CKD IV, bilateral carotid artery stenosis L>R, CVA in 11/2016, GERD, HLD, h/o hypotension, iron deficiency, vitamin D deficiency, underweight, SAH, paroxysmal a-fib, multiple melanoma's of the scalp (March and April 2019), h/o urinary outlet obstruction s/p transurethral vaporization of the prostate in 2012.   Precautions  Precautions Fall;Other (comment)  Precaution Comments monitor BP  Restrictions  Weight Bearing Restrictions No  Home Living  Family/patient expects to be discharged to: Private residence  Living Arrangements Alone  Available Help at Discharge Family;Available PRN/intermittently (son and son's girlfriend)  Type of Why to enter  Entrance Stairs-Number of Steps 1  (1 step on porch)  Home Layout One level  Bathroom Therapist, music Grab bars - tub/shower  Prior Function  Level of Independence Independent  Comments Independent with ADLs, iADLs, and mobility without AD. Pt was walking dog 0.5 miles daily. Pt's son's girfriend was driving pt to appointments, assisting with cleaning as  needed  Pain Assessment  Pain Assessment No/denies pain  Cognition  Arousal/Alertness Awake/alert  Behavior During Therapy WFL for tasks assessed/performed  Overall Cognitive Status Within Functional Limits for tasks assessed  Upper Extremity Assessment  Upper Extremity Assessment Generalized weakness  Lower Extremity Assessment  Lower Extremity Assessment Defer to PT evaluation  ADL  Overall ADL's  Needs assistance/impaired  Eating/Feeding Independent;Sitting  Grooming Supervision/safety;Standing  Upper Body Bathing Independent;Sitting  Lower Body Bathing Supervison/ safety;Sit to/from stand;Sitting/lateral leans  Upper Body Dressing  Independent;Sitting  Lower Body Dressing Supervision/safety;Sit to/from stand;Sitting/lateral Quarry manager Supervision/safety;Ambulation;Regular Toilet  Toileting- Water quality scientist and Hygiene Supervision/safety;Sitting/lateral lean;Sit to/from stand  Functional mobility during ADLs Supervision/safety  General ADL Comments Supervision at most for LB ADLs and acitivities in standing to ensure stability and safety due to fluctuating BPs  Vision- History  Baseline Vision/History Wears glasses  Bed Mobility  Overal bed mobility Independent  General bed mobility comments Independent without use of bed rails   Transfers  Overall transfer level Needs assistance  Equipment used None  Transfers Sit to/from [employer] Transfers  Sit to Stand Independent  Stand pivot transfers Supervision  General transfer comment Supervision to ensure safety due to fluctuating BP   Balance  Overall balance assessment No apparent balance deficits (not formally assessed)  General Comments  General comments (skin integrity, edema, etc.) Assessed BP throughout transitional movements. 121/49 at rest in bed, 125/58 sitting EOB, 100/62 after standing, 133/61 after activity. Pt reported mild dizziness that did not impact activities   OT - End of Session   Equipment Utilized During Treatment Gait belt  Activity Tolerance Patient tolerated treatment well  Patient left  in bed;with call bell/phone within reach;with bed alarm set  Nurse Communication Mobility status;Other (comment) (BPs to NT)  OT Assessment  OT Recommendation/Assessment Patient needs continued OT Services  OT Visit Diagnosis Other abnormalities of gait and mobility (R26.89)  OT Problem List Decreased activity tolerance  OT Plan  OT Frequency (ACUTE ONLY) Min 2X/week  OT Treatment/Interventions (ACUTE ONLY) Self-care/ADL training;Therapeutic exercise;Energy conservation;Therapeutic activities;Patient/family education  AM-PAC OT "6 Clicks" Daily Activity Outcome Measure (Version 2)  Help from another person eating meals? 4  Help from another person taking care of personal grooming? 3  Help from another person toileting, which includes using toliet, bedpan, or urinal? 3  Help from another person bathing (including washing, rinsing, drying)? 3  Help from another person to put on and taking off regular upper body clothing? 4  Help from another person to put on and taking off regular lower body clothing? 3  6 Click Score 20  OT Recommendation  Follow Up Recommendations No OT follow up;Supervision - Intermittent (May benefit from intermittent assistance with IADLs)  OT Equipment None recommended by OT  Individuals Consulted  Consulted and Agree with Results and Recommendations Patient  Acute Rehab OT Goals  Patient Stated Goal improve BP, Hgb levels   OT Goal Formulation With patient  Time For Goal Achievement 05/23/19  Potential to Achieve Goals Good  OT Time Calculation  OT Start Time (ACUTE ONLY) 0825  OT Stop Time (ACUTE ONLY) 0910  OT Time Calculation (min) 45 min  OT General Charges  $OT Visit 1 Visit  OT Evaluation  $OT Eval Low Complexity 1 Low  OT Treatments  $Self Care/Home Management  8-22 mins  $Therapeutic Activity 8-22 mins

## 2019-05-10 ENCOUNTER — Inpatient Hospital Stay (HOSPITAL_COMMUNITY): Payer: Medicare Other

## 2019-05-10 ENCOUNTER — Inpatient Hospital Stay (HOSPITAL_COMMUNITY): Payer: Medicare Other | Admitting: Certified Registered Nurse Anesthetist

## 2019-05-10 ENCOUNTER — Encounter (HOSPITAL_COMMUNITY): Payer: Self-pay | Admitting: Family Medicine

## 2019-05-10 ENCOUNTER — Encounter (HOSPITAL_COMMUNITY)
Admission: AD | Disposition: A | Discharge status: Home / Self Care | Payer: Self-pay | Source: Ambulatory Visit | Attending: Family Medicine

## 2019-05-10 DIAGNOSIS — C434 Malignant melanoma of scalp and neck: Secondary | ICD-10-CM

## 2019-05-10 DIAGNOSIS — D490 Neoplasm of unspecified behavior of digestive system: Secondary | ICD-10-CM

## 2019-05-10 DIAGNOSIS — K922 Gastrointestinal hemorrhage, unspecified: Secondary | ICD-10-CM

## 2019-05-10 DIAGNOSIS — K6389 Other specified diseases of intestine: Secondary | ICD-10-CM

## 2019-05-10 DIAGNOSIS — N184 Chronic kidney disease, stage 4 (severe): Secondary | ICD-10-CM

## 2019-05-10 HISTORY — PX: COLONOSCOPY WITH PROPOFOL: SHX5780

## 2019-05-10 HISTORY — PX: SUBMUCOSAL TATTOO INJECTION: SHX6856

## 2019-05-10 HISTORY — PX: BIOPSY: SHX5522

## 2019-05-10 HISTORY — PX: ESOPHAGOGASTRODUODENOSCOPY (EGD) WITH PROPOFOL: SHX5813

## 2019-05-10 HISTORY — PX: POLYPECTOMY: SHX5525

## 2019-05-10 LAB — CBC
HCT: 35.2 % — ABNORMAL LOW (ref 39.0–52.0)
HCT: 28.9 % — ABNORMAL LOW (ref 39.0–52.0)
Hemoglobin: 10.8 g/dL — ABNORMAL LOW (ref 13.0–17.0)
Hemoglobin: 8.9 g/dL — ABNORMAL LOW (ref 13.0–17.0)
MCH: 29 pg (ref 26.0–34.0)
MCH: 28.9 pg (ref 26.0–34.0)
MCHC: 30.7 g/dL (ref 30.0–36.0)
MCHC: 30.8 g/dL (ref 30.0–36.0)
MCV: 94.6 fL (ref 80.0–100.0)
MCV: 93.8 fL (ref 80.0–100.0)
Platelets: 264 10*3/uL (ref 150–400)
Platelets: 175 10*3/uL (ref 150–400)
RBC: 3.72 MIL/uL — ABNORMAL LOW (ref 4.22–5.81)
RBC: 3.08 MIL/uL — ABNORMAL LOW (ref 4.22–5.81)
RDW: 14.9 % (ref 11.5–15.5)
RDW: 15 % (ref 11.5–15.5)
WBC: 8.8 10*3/uL (ref 4.0–10.5)
WBC: 9.6 10*3/uL (ref 4.0–10.5)
nRBC: 0 % (ref 0.0–0.2)
nRBC: 0 % (ref 0.0–0.2)

## 2019-05-10 LAB — MAGNESIUM: Magnesium: 2.1 mg/dL (ref 1.7–2.4)

## 2019-05-10 LAB — TROPONIN I (HIGH SENSITIVITY)
Troponin I (High Sensitivity): 9 ng/L (ref <18)
Troponin I (High Sensitivity): 9 ng/L (ref <18)

## 2019-05-10 LAB — BASIC METABOLIC PANEL
Anion gap: 11 (ref 5–15)
BUN: 29 mg/dL — ABNORMAL HIGH (ref 8–23)
CO2: 18 mmol/L — ABNORMAL LOW (ref 22–32)
Calcium: 9.1 mg/dL (ref 8.9–10.3)
Chloride: 116 mmol/L — ABNORMAL HIGH (ref 98–111)
Creatinine, Ser: 3.08 mg/dL — ABNORMAL HIGH (ref 0.61–1.24)
GFR calc Af Amer: 21 mL/min — ABNORMAL LOW (ref >60)
GFR calc non Af Amer: 19 mL/min — ABNORMAL LOW (ref >60)
Glucose, Bld: 116 mg/dL — ABNORMAL HIGH (ref 70–99)
Potassium: 4.7 mmol/L (ref 3.5–5.1)
Sodium: 145 mmol/L (ref 135–145)

## 2019-05-10 SURGERY — ESOPHAGOGASTRODUODENOSCOPY (EGD) WITH PROPOFOL
Anesthesia: Monitor Anesthesia Care

## 2019-05-10 MED ORDER — ALBUMIN HUMAN 5 % IV SOLN: INTRAVENOUS | Status: DC | PRN

## 2019-05-10 MED ORDER — ACETAMINOPHEN 325 MG PO TABS
650.0000 mg | ORAL_TABLET | Freq: Four times a day (QID) | ORAL | Status: DC | PRN
  Administered 2019-05-10 – 2019-05-11 (×3): 650 mg via ORAL
  Filled 2019-05-10 (×3): qty 2

## 2019-05-10 MED ORDER — SPOT INK MARKER SYRINGE KIT
PACK | SUBMUCOSAL | Status: DC | PRN
  Administered 2019-05-10: 4 mL via SUBMUCOSAL

## 2019-05-10 MED ORDER — PROPOFOL 500 MG/50ML IV EMUL
INTRAVENOUS | Status: DC | PRN
  Administered 2019-05-10: 50 ug/kg/min via INTRAVENOUS

## 2019-05-10 MED ORDER — SPOT INK MARKER SYRINGE KIT
PACK | SUBMUCOSAL | Status: AC
  Filled 2019-05-10: qty 5

## 2019-05-10 MED ORDER — SODIUM CHLORIDE 0.9 % IV SOLN: INTRAVENOUS | Status: DC

## 2019-05-10 MED ORDER — PROPOFOL 10 MG/ML IV BOLUS
INTRAVENOUS | Status: DC | PRN
  Administered 2019-05-10: 40 mg via INTRAVENOUS
  Administered 2019-05-10: 20 mg via INTRAVENOUS

## 2019-05-10 MED ORDER — LIDOCAINE 2% (20 MG/ML) 5 ML SYRINGE
INTRAMUSCULAR | Status: DC | PRN
  Administered 2019-05-10: 60 mg via INTRAVENOUS

## 2019-05-10 MED ORDER — SODIUM CHLORIDE 0.9 % IV SOLN: INTRAVENOUS | Status: DC | PRN

## 2019-05-10 MED ORDER — PHENYLEPHRINE 40 MCG/ML (10ML) SYRINGE FOR IV PUSH (FOR BLOOD PRESSURE SUPPORT)
PREFILLED_SYRINGE | INTRAVENOUS | Status: DC | PRN
  Administered 2019-05-10 (×5): 80 ug via INTRAVENOUS

## 2019-05-10 SURGICAL SUPPLY — 25 items

## 2019-05-10 NOTE — Plan of Care (Signed)
?  Problem: Elimination: ?Goal: Will not experience complications related to urinary retention ?Outcome: Progressing ?  ?

## 2019-05-10 NOTE — Op Note (Signed)
[facility] Patient Name: Keith [name] Procedure Date : 05/10/2019 MRN: [id number] Attending MD: [name] , MD Date of Birth: [birth year] CSN: [id number] Age: 79 Admit Type: Inpatient Procedure:                Upper GI endoscopy Indications:              Recent gastrointestinal bleeding Providers:                [name], MD, Vista Lawman, RN, Theodora Blow,                            Technician Referring MD:              Medicines:                Monitored Anesthesia Care Complications:            No immediate complications. Estimated Blood Loss:     Estimated blood loss: none. Procedure:                Pre-Anesthesia Assessment:                           - Prior to the procedure, a History and Physical                            was performed, and patient medications and                            allergies were reviewed. The patient's tolerance of                            previous anesthesia was also reviewed. The risks                            and benefits of the procedure and the sedation                            options and risks were discussed with the patient.                            All questions were answered, and informed consent                            was obtained. Prior Anticoagulants: The patient has                            taken Eliquis (apixaban), last dose was 2 days                            prior to procedure. ASA Grade Assessment: III - A                            patient with severe systemic disease. After  reviewing the risks and benefits, the patient was                            deemed in satisfactory condition to undergo the                            procedure.                           After obtaining informed consent, the endoscope was                            passed under direct vision. Throughout the                            procedure, the patient's blood pressure, pulse, and                             oxygen saturations were monitored continuously. The                            GIF-H190 ([id number] Olympus gastroscope was                            introduced through the mouth, and advanced to the                            second part of duodenum. The upper GI endoscopy was                            accomplished without difficulty. The patient                            tolerated the procedure well. Scope In: Scope Out: Findings:      The examined esophagus was normal. Wide open Schatzki's ring was noted       at GE junction, 32 cm from incisors.      A 4 cm hiatal hernia was present.      The entire examined stomach was normal. Biopsies were taken with a cold       forceps for histology.      The examined duodenum was normal. Biopsies for histology were taken with       a cold forceps for evaluation of celiac disease. Impression:               - 4 cm hiatal hernia.                           - Otherwise normal EGD. No UGI bleeding. Recommendation:           - Return patient to hospital ward for ongoing care.                           - Resume previous diet.                           -  Continue present medications.                           - Await pathology results. Procedure Code(s):        --- Professional ---                           [phone], Esophagogastroduodenoscopy, flexible,                            transoral; with biopsy, single or multiple Diagnosis Code(s):        --- Professional ---                           K44.9, Diaphragmatic hernia without obstruction or                            gangrene                           K92.2, Gastrointestinal hemorrhage, unspecified CPT copyright 2019 American Medical Association. All rights reserved. The codes documented in this report are preliminary and upon coder review may  be revised to meet current compliance requirements. [name], MD 05/10/2019 12:00:47 PM This report has been signed  electronically. Number of Addenda: 0

## 2019-05-10 NOTE — Anesthesia Postprocedure Evaluation (Signed)
Anesthesia Post Note  Patient: Keith [name]  Procedure(s) Performed: ESOPHAGOGASTRODUODENOSCOPY (EGD) WITH PROPOFOL (N/A ) COLONOSCOPY WITH PROPOFOL (N/A ) BIOPSY POLYPECTOMY SUBMUCOSAL TATTOO INJECTION     Patient location during evaluation: PACU Anesthesia Type: MAC Level of consciousness: awake and alert Pain management: pain level controlled Vital Signs Assessment: post-procedure vital signs reviewed and stable Respiratory status: spontaneous breathing, nonlabored ventilation, respiratory function stable and patient connected to nasal cannula oxygen Cardiovascular status: stable and blood pressure returned to baseline Postop Assessment: no apparent nausea or vomiting Anesthetic complications: no    Last Vitals:  Vitals:   05/10/19 1210 05/10/19 1220  BP: (!) 101/32 (!) 105/34  Pulse: 74 74  Resp: 15 16  Temp:    SpO2: 100% 98%    Last Pain:  Vitals:   05/10/19 1220  TempSrc:   PainSc: 0-No pain                 Effie Berkshire

## 2019-05-10 NOTE — Interval H&P Note (Signed)
History and Physical Interval Note:  05/10/2019 10:51 AM  Keith [name]  has presented today for surgery, with the diagnosis of Anemia, rectal bleeding.  The various methods of treatment have been discussed with the patient and family. After consideration of risks, benefits and other options for treatment, the patient has consented to  Procedure(s): ESOPHAGOGASTRODUODENOSCOPY (EGD) WITH PROPOFOL (N/A) COLONOSCOPY WITH PROPOFOL (N/A) as a surgical intervention.  The patient's history has been reviewed, patient examined, no change in status, stable for surgery.  I have reviewed the patient's chart and labs.  Questions were answered to the patient's satisfaction.     [name]

## 2019-05-10 NOTE — Plan of Care (Signed)
  Problem: Clinical Measurements: Goal: Ability to maintain clinical measurements within normal limits will improve Outcome: Progressing   

## 2019-05-10 NOTE — Transfer of Care (Signed)
Immediate Anesthesia Transfer of Care Note  Patient: Oval A Larke  Procedure(s) Performed: ESOPHAGOGASTRODUODENOSCOPY (EGD) WITH PROPOFOL (N/A ) COLONOSCOPY WITH PROPOFOL (N/A ) BIOPSY POLYPECTOMY SUBMUCOSAL TATTOO INJECTION  Patient Location: PACU and Endoscopy Unit  Anesthesia Type:MAC  Level of Consciousness: awake, alert  and patient cooperative  Airway & Oxygen Therapy: Patient Spontanous Breathing and Patient connected to nasal cannula oxygen  Post-op Assessment: Report given to RN and Post -op Vital signs reviewed and stable  Post vital signs: Reviewed and stable  Last Vitals:  Vitals Value Taken Time  BP 93/37 05/10/19 1158  Temp    Pulse 76 05/10/19 1158  Resp 15 05/10/19 1158  SpO2 100 % 05/10/19 1158  Vitals shown include unvalidated device data.  Last Pain:  Vitals:   05/10/19 1052  TempSrc: Oral  PainSc: 0-No pain      Patients Stated Pain Goal: 0 (21/62/44 6950)  Complications: No apparent anesthesia complications

## 2019-05-10 NOTE — Op Note (Addendum)
[facility] Patient Name: Keith [name] Procedure Date : 05/10/2019 MRN: [id number] Attending MD: [name] , MD Date of Birth: [birth year] CSN: [id number] Age: 79 Admit Type: Inpatient Procedure:                Colonoscopy Indications:              Rectal bleeding. Providers:                [name], MD, Vista Lawman, RN, Theodora Blow,                            Technician Referring MD:              Medicines:                Monitored Anesthesia Care Complications:            No immediate complications. Estimated Blood Loss:     Estimated blood loss: none. Procedure:                Pre-Anesthesia Assessment:                           - Prior to the procedure, a History and Physical                            was performed, and patient medications and                            allergies were reviewed. The patient's tolerance of                            previous anesthesia was also reviewed. The risks                            and benefits of the procedure and the sedation                            options and risks were discussed with the patient.                            All questions were answered, and informed consent                            was obtained. Prior Anticoagulants: The patient has                            taken Eliquis (apixaban), last dose was 2 days                            prior to procedure. ASA Grade Assessment: III - A                            patient with severe systemic disease. After                            reviewing  the risks and benefits, the patient was                            deemed in satisfactory condition to undergo the                            procedure.                           - Prior to the procedure, a History and Physical                            was performed, and patient medications and                            allergies were reviewed. The patient's tolerance of                            previous  anesthesia was also reviewed. The risks                            and benefits of the procedure and the sedation                            options and risks were discussed with the patient.                            All questions were answered, and informed consent                            was obtained. Prior Anticoagulants: The patient has                            taken Eliquis (apixaban), last dose was 2 days                            prior to procedure. ASA Grade Assessment: III - A                            patient with severe systemic disease. After                            reviewing the risks and benefits, the patient was                            deemed in satisfactory condition to undergo the                            procedure.                           After obtaining informed consent, the colonoscope  was passed under direct vision. Throughout the                            procedure, the patient's blood pressure, pulse, and                            oxygen saturations were monitored continuously. The                            PCF-H190DL ([id number] Olympus pediatric colonoscope                            was introduced through the anus and advanced to the                            2 cm into the ileum. The colonoscopy was performed                            without difficulty. The patient tolerated the                            procedure well. The quality of the bowel                            preparation was good. The terminal ileum, ileocecal                            valve, appendiceal orifice, and rectum were                            photographed. Scope In: 11:23:00 AM Scope Out: 11:52:43 AM Scope Withdrawal Time: 0 hours 25 minutes 22 seconds  Total Procedure Duration: 0 hours 29 minutes 43 seconds  Findings:      A 2.5 cm polypoid, ulcerated, friable, non-obstructing mass was found in       the distal transverse colon  involving 1/4th of the circumference. This       was biopsied with a cold forceps for histology. Area was tattooed with       an injection of 3 mL of Spot (carbon black), just distal to the mass.      Three sessile polyps were found in the ascending colon. The polyps were       4 to 6 mm in size. These polyps were removed with a cold snare.       Resection and retrieval were complete.      A 12 mm polyp was found in the distal sigmoid colon. The polyp was       semi-pedunculated. The polyp was removed with a hot snare. Resection and       retrieval were complete. Area was tattooed with an injection of 1 mL of       [place] ink.      A few rare small-mouthed diverticula were found in the sigmoid colon.      Non-bleeding internal hemorrhoids were found during retroflexion. The       hemorrhoids were small.      The terminal ileum appeared normal. Impression:               -  Malignant appearing tumor in the distal                            transverse colon. Biopsied. Tattooed.                           - Three 4 to 6 mm polyps in the ascending colon,                            removed with a cold snare. Resected and retrieved.                           - One 12 mm polyp in the distal sigmoid colon,                            removed with a hot snare. Resected and retrieved.                            Tattooed.                           - Mild sigmoid diverticulosis.                           - Otherwise normal colonoscopy to TI. Recommendation:           - Return patient to hospital ward for ongoing care.                           - Resume previous diet.                           - Follow biopsies.                           - CT chest/abdomen/pelvis with p.o. and IV contrast.                           - Continue to hold Eliquis for now.                           - Due to H/O recent melanoma, and depending upon                            the above biopsies/CT, he may require PET CT as                             well.                           - Surgical/oncology consultation once the biopsies                            are back (early next week)                           -  Trend CBC. Transfuse as needed. Check CEA. Procedure Code(s):        --- Professional ---                           [phone], Colonoscopy, flexible; with removal of                            tumor(s), polyp(s), or other lesion(s) by snare                            technique                           45381, Colonoscopy, flexible; with directed                            submucosal injection(s), any substance                           62947, 59, Colonoscopy, flexible; with biopsy,                            single or multiple Diagnosis Code(s):        --- Professional ---                           C18.4, Malignant neoplasm of transverse colon                           K63.5, Polyp of colon                           K64.8, Other hemorrhoids                           R19.5, Other fecal abnormalities                           K57.30, Diverticulosis of large intestine without                            perforation or abscess without bleeding CPT copyright 2019 American Medical Association. All rights reserved. The codes documented in this report are preliminary and upon coder review may  be revised to meet current compliance requirements. [name], MD 05/10/2019 12:14:19 PM This report has been signed electronically. Number of Addenda: 0

## 2019-05-10 NOTE — Anesthesia Preprocedure Evaluation (Addendum)
Anesthesia Evaluation  Patient identified by MRN, date of birth, ID band Patient awake    Reviewed: Allergy & Precautions, NPO status , Patient's Chart, lab work & pertinent test results  History of Anesthesia Complications (+) PONV  Airway Mallampati: I  TM Distance: >3 FB Neck ROM: Full    Dental  (+) Edentulous Upper, Edentulous Lower   Pulmonary former smoker,    Pulmonary exam normal        Cardiovascular  Rhythm:Regular Rate:Normal     Neuro/Psych CVA    GI/Hepatic Neg liver ROS, hiatal hernia, GERD  Medicated,  Endo/Other  Hypothyroidism   Renal/GU Renal Insufficiency and CRFRenal disease     Musculoskeletal negative musculoskeletal ROS (+)   Abdominal Normal abdominal exam  (+)   Peds  Hematology   Anesthesia Other Findings   Reproductive/Obstetrics                            Echo:  Left ventricle: The cavity size was normal. Wall thickness was  normal. Systolic function was normal. The estimated ejection  fraction was in the range of 55% to 60%. Wall motion was normal;  there were no regional wall motion abnormalities. Features are  consistent with a pseudonormal left ventricular filling pattern,  with concomitant abnormal relaxation and increased filling  pressure (grade 2 diastolic dysfunction).  - Aortic valve: There was no stenosis. There was mild  regurgitation.  - Mitral valve: There was trivial regurgitation.  - Right ventricle: The cavity size was normal. Systolic function  was normal.  - Pulmonary arteries: No complete TR doppler jet so unable to  estimate PA systolic pressure.  - Inferior vena cava: The vessel was normal in size. The  respirophasic diameter changes were in the normal range (>= 50%),  consistent with normal central venous pressure.   Anesthesia Physical Anesthesia Plan  ASA: III  Anesthesia Plan: MAC   Post-op Pain  Management:    Induction: Intravenous  PONV Risk Score and Plan: 0 and Propofol infusion  Airway Management Planned: Natural Airway and Nasal Cannula  Additional Equipment: None  Intra-op Plan:   Post-operative Plan:   Informed Consent: I have reviewed the patients History and Physical, chart, labs and discussed the procedure including the risks, benefits and alternatives for the proposed anesthesia with the patient or authorized representative who has indicated his/her understanding and acceptance.       Plan Discussed with: CRNA  Anesthesia Plan Comments:        Anesthesia Quick Evaluation

## 2019-05-10 NOTE — Progress Notes (Signed)
FPTS Interim Progress Note  S: Received page from nurse stating that call from telemetry notified her of ST segment elevations.  Reported to bedside in order to evaluate patient.  Patient reports that he has no chest pain, no shortness of breath, no sensation of heartburn.  Patient reports unpleasant sensation of completing bowel prep but otherwise has no pain or complaints.  O: BP 106/65 (BP Location: Right Arm)   Pulse 96   Temp 97.6 F (36.4 C) (Oral)   Resp 20   Wt 51.7 kg   SpO2 98%   BMI 16.83 kg/m   General: Elderly male, pale appearing, sitting up in bed, pleasantly conversational Cardio: Regularly irregular rhythm, no tenderness to palpation of precordium,   A/P: Telemetry reported ST elevations Patient is a 79 year old male admitted for symptomatic anemia.  Patient to undergo EGD and colonoscopy today.  Patient denies chest pain, shortness of breath, symptoms of GERD. -stat EKG ordered   Stark Klein, MD 05/10/2019, 5:56 AM PGY-1, [facility] pager [phone]

## 2019-05-10 NOTE — Anesthesia Procedure Notes (Signed)
Procedure Name: MAC Date/Time: 05/10/2019 11:05 AM Performed by: Renato Shin, CRNA Pre-anesthesia Checklist: Patient identified, Emergency Drugs available, Suction available and Patient being monitored Patient Re-evaluated:Patient Re-evaluated prior to induction Oxygen Delivery Method: Nasal cannula Preoxygenation: Pre-oxygenation with 100% oxygen Induction Type: IV induction Placement Confirmation: positive ETCO2 and breath sounds checked- equal and bilateral Dental Injury: Teeth and Oropharynx as per pre-operative assessment

## 2019-05-10 NOTE — Progress Notes (Signed)
Family Medicine Teaching Service Daily Progress Note Intern Pager: [phone]  Patient name: Keith [name] Medical record number: [id number] Date of birth: [birth year] Age: 79 y.o. Gender: male  Primary Care Provider: Bonnita Hollow, MD Consultants: GI Code Status: DNR  Pt Overview and Major Events to Date:  04/22- Admitted  Assessment and Plan: Keith [name] is a 79 y.o. male presenting from [facility] . PMH is significant for CKD IV, bilateral carotid artery stenosis L>R, CVA in 11/2016, GERD, HLD, h/o hypotension, iron deficiency, vitamin D deficiency, underweight, SAH, paroxysmal a-fib, multiple melanoma's of the scalp (March and April 2019), h/o urinary outlet obstruction s/p transurethral vaporization of the prostate in 2012.   Worsening Fatigue  Symptomatic Anemia: Improving.  Hemoglobin 9.5 s/p 2 units PRBC. FOBT positive.  Folate pending.   -GI following, recommend colonoscopy and EGD today. -Transfusion threshold <8 -CBC in a.m. -Continue to hold Eliquis -Avoid NSAIDs -Continue ferrous sulfate  -Continue IV Protonix -Continuous cardiac monitoring -Vital signs per unit  H/o CVA  Bilateral Carotid Stenosis  Questionable h/o SAH  Paroxysmal A-fib: Chronic.  Stable.  Home medication Eliquis -Continue to hold Eliquis in the setting of symptomatic anemia -Continuous cardiac monitoring  HLD:  Chronic.  Stable. -Continue atorvastatin 40 mg daily   CKD IV: Baseline creatinine 2.2-2.4  Creatinine 2.76 today.  Elevated from admission 2.53.  Baseline 2.2-2.4 -avoid nephrotoxic agents.  FEN/GI:  - NPO then Heart Healthy, Protonix  Prophylaxis: -SCDs   Disposition: Home when medically stable  Subjective:  Uneventful night. Feeling better.  Denies any chest pain, SOB, abd pain.  NO   Objective: Temp:  [97.3 F (36.3 C)-97.8 F (36.6 C)] 97.6 F (36.4 C) (04/24 0454) Pulse Rate:  [65-96] 96 (04/24 0454) Resp:  [18-20] 20 (04/24 0454) BP:  (106-141)/(44-65) 106/65 (04/24 0454) SpO2:  [98 %-100 %] 98 % (04/24 0454) Physical Exam:  General: 79 y.o male in no acute distress CVS: RRR, no murmurs or gallops appreciated  Resp: CTAB, no wheezing or crackles heard.  No increased work of breathing Abdomen: Soft, nontender, nondistended.  Bowel sounds present Extremities: No lower extremity edema.  Laboratory: Recent Labs  Lab 05/08/19 1858 05/09/19 0435 05/09/19 1519  WBC 6.8 5.8  --   HGB 7.0* 7.9* 9.5*  HCT 23.6* 26.3* 30.2*  PLT 238 213  --    Recent Labs  Lab 05/08/19 1858 05/09/19 0435  NA 136 141  K 3.9 4.3  CL 104 112*  CO2 21* 23  BUN 33* 33*  CREATININE 2.53* 2.76*  CALCIUM 8.6* 8.8*  PROT 5.9*  --   BILITOT 0.2*  --   ALKPHOS 64  --   ALT 9  --   AST 17  --   GLUCOSE 155* 84    Imaging/Diagnostic Tests:   Carollee Leitz, MD 05/10/2019, 6:53 AM PGY-1, [facility] Intern pager: [phone], text pages welcome

## 2019-05-11 LAB — CBC WITH DIFFERENTIAL/PLATELET
Abs Immature Granulocytes: 0.05 10*3/uL (ref 0.00–0.07)
Basophils Absolute: 0 10*3/uL (ref 0.0–0.1)
Basophils Relative: 0 %
Eosinophils Absolute: 0 10*3/uL (ref 0.0–0.5)
Eosinophils Relative: 0 %
HCT: 31 % — ABNORMAL LOW (ref 39.0–52.0)
Hemoglobin: 9.4 g/dL — ABNORMAL LOW (ref 13.0–17.0)
Immature Granulocytes: 1 %
Lymphocytes Relative: 8 %
Lymphs Abs: 0.8 10*3/uL (ref 0.7–4.0)
MCH: 29.1 pg (ref 26.0–34.0)
MCHC: 30.3 g/dL (ref 30.0–36.0)
MCV: 96 fL (ref 80.0–100.0)
Monocytes Absolute: 0.8 10*3/uL (ref 0.1–1.0)
Monocytes Relative: 8 %
Neutro Abs: 8.5 10*3/uL — ABNORMAL HIGH (ref 1.7–7.7)
Neutrophils Relative %: 83 %
Platelets: 182 10*3/uL (ref 150–400)
RBC: 3.23 MIL/uL — ABNORMAL LOW (ref 4.22–5.81)
RDW: 14.9 % (ref 11.5–15.5)
WBC: 10.2 10*3/uL (ref 4.0–10.5)
nRBC: 0 % (ref 0.0–0.2)

## 2019-05-11 LAB — FOLATE RBC
Folate, Hemolysate: 328 ng/mL
Folate, RBC: 1408 ng/mL (ref >498)
Hematocrit: 23.3 % — ABNORMAL LOW (ref 37.5–51.0)

## 2019-05-11 LAB — BASIC METABOLIC PANEL
Anion gap: 8 (ref 5–15)
BUN: 29 mg/dL — ABNORMAL HIGH (ref 8–23)
CO2: 20 mmol/L — ABNORMAL LOW (ref 22–32)
Calcium: 8.7 mg/dL — ABNORMAL LOW (ref 8.9–10.3)
Chloride: 113 mmol/L — ABNORMAL HIGH (ref 98–111)
Creatinine, Ser: 3.31 mg/dL — ABNORMAL HIGH (ref 0.61–1.24)
GFR calc Af Amer: 20 mL/min — ABNORMAL LOW (ref >60)
GFR calc non Af Amer: 17 mL/min — ABNORMAL LOW (ref >60)
Glucose, Bld: 96 mg/dL (ref 70–99)
Potassium: 4.1 mmol/L (ref 3.5–5.1)
Sodium: 141 mmol/L (ref 135–145)

## 2019-05-11 MED ORDER — LACTATED RINGERS IV SOLN: INTRAVENOUS | Status: AC

## 2019-05-11 MED ORDER — FAMOTIDINE 20 MG PO TABS
20.0000 mg | ORAL_TABLET | Freq: Every day | ORAL | Status: DC
  Administered 2019-05-11 – 2019-05-12 (×2): 20 mg via ORAL
  Filled 2019-05-11 (×2): qty 1

## 2019-05-11 MED ORDER — FAMOTIDINE 20 MG PO TABS: 20.0000 mg | ORAL_TABLET | Freq: Two times a day (BID) | ORAL | Status: DC

## 2019-05-11 NOTE — Progress Notes (Signed)
Family members requested to this nurse to change the dressing on top of his head.They mentioned the medicine and skin barrier dressing stuffs and they said that it has been two days now since it was change by the outside facility [facility].Nurse explained that ,as of this time ,we do not have order to change the dressing and explained the hospital protocol to family member.Family members said,they were going to come back tonight to bring the dressing stuffs for him.

## 2019-05-11 NOTE — Progress Notes (Signed)
Patient's family member approached this nurse,requesting Tylenol for him.They also informed me that,they change the dressing on top of his head.

## 2019-05-11 NOTE — Progress Notes (Addendum)
Family Medicine Teaching Service Daily Progress Note Intern Pager: [phone]  Patient name: Keith [name] Avey Medical record number: [id number] Date of birth: 29-Nov-1940 Age: 79 y.o. Gender: male  Primary Care Provider: Bonnita Hollow, MD Consultants: GI Code Status: DNR  Pt Overview and Major Events to Date:  04/22- Admitted  Assessment and Plan: Keith [name] is a 80 y.o. male presenting with a subacute history of hematochezia and lightheadedness/fatigue, concerning for GI bleed. PMH is significant for CKD IV, bilateral carotid artery stenosis L>R, CVA in 11/2016, GERD, HLD,  iron deficiency, vitamin D deficiency, underweight, SAH, paroxysmal a-fib, multiple melanoma's of the scalp (March and April 2019), h/o urinary outlet obstruction s/p transurethral vaporization of the prostate in 2012.    Subacute LGIB, found to have colonic mass:  2.5 cm malignant appearing tumor found within distal transverse colon on colonoscopy 4/24.  CT chest/abdomen without evidence of metastasis.  Bleeding appears to have ceased. -GI on board, recommend proceeding with surgical evaluation  -Consult general surgery today as above -F/u surgical pathology/biopsies taken -F/U CEA level -Pending results, will also need oncology follow-up (already follows with Dr. Alen Blew for melanoma)  -Follow CBC  Worsening Fatigue  Symptomatic Anemia: Improving. In the setting of GI bleed as discussed above.  Hemoglobin stable at 9.4 this am.  -Monitor CBC -Continue ferrous sulfate 325 mg  -Follow-up folate level -Discontinue IV Protonix (no upper GI abnormalities found on EGD), restart home pepcid BID  -Avoid NSAIDs  AKI on CKD IV: Worsening. Cr 3.3 today, 2.5 on admit.  Baseline appears around 2.2-2.5, however has been in 3-4 several times in the past.  Likely prerenal due to bleed and bowel prep recently. -LR 100 mL/hour x 24 hours  -Encourage continued p.o. intake -Avoid nephrotoxic medications as  possible  Stage IIC scalp melanoma: Stable. S/p excision and sentinel lymph node biopsy with left neck dissection in 04/2017 with poor wound healing. -Recommend continue follow-up with Dr. Alen Blew and Dr. Ronnald Ramp (dermatology) outpatient  H/o CVA  Bilateral Carotid Stenosis ?h/o SAH  Paroxysmal A-fib: Chronic, stable. Regular rhythm currently.  -Continue to hold Eliquis in setting of GI bleed -Continue home atorvastatin 40 mg  Hyperlipidemia: Chronic, stable. -Continue atorvastatin 40 mg daily  FEN/GI:  Heart healthy diet   Prophylaxis: -SCDs  Disposition: Pending surgical evaluation, may be able to DC home tomorrow 4/26   Subjective:  No acute events overnight.  States he is feeling great this morning, finally had a normal breakfast and he is very excited about this.  Has a [place] bit of lower abdominal discomfort after colonoscopy, no associated dysuria or urinary frequency/urgency.   Objective: Temp:  [98 F (36.7 C)-99.3 F (37.4 C)] 98 F (36.7 C) (04/25 0929) Pulse Rate:  [60-96] 60 (04/25 0929) Resp:  [15-18] 18 (04/25 0929) BP: (93-136)/(32-95) 95/61 (04/25 0929) SpO2:  [95 %-100 %] 98 % (04/25 0929) Weight:  [51.7 kg] 51.7 kg (04/25 0500) Physical Exam: General: Older, thin gentleman in NAD, sitting up eating breakfast HEENT: NCAT Cardiac: RRR Lungs: Clear bilaterally, no increased WOB  Abdomen: soft, nondistended, mild tenderness with palpation to suprapubic region, normoactive bowel sounds Msk: Moves all extremities spontaneously  Ext: Warm, dry, 2+ distal pulses, no edema   Laboratory: Recent Labs  Lab 05/10/19 0708 05/10/19 2237 05/11/19 0502  WBC 8.8 9.6 10.2  HGB 10.8* 8.9* 9.4*  HCT 35.2* 28.9* 31.0*  PLT 264 175 182   Recent Labs  Lab 05/08/19 1858 05/08/19 1858 05/09/19 0435 05/10/19 0175  05/11/19 0502  NA 136   < > 141 145 141  K 3.9   < > 4.3 4.7 4.1  CL 104   < > 112* 116* 113*  CO2 21*   < > 23 18* 20*  BUN 33*   < > 33* 29* 29*   CREATININE 2.53*   < > 2.76* 3.08* 3.31*  CALCIUM 8.6*   < > 8.8* 9.1 8.7*  PROT 5.9*  --   --   --   --   BILITOT 0.2*  --   --   --   --   ALKPHOS 64  --   --   --   --   ALT 9  --   --   --   --   AST 17  --   --   --   --   GLUCOSE 155*   < > 84 116* 96   < > = values in this interval not displayed.    Imaging/Diagnostic Tests: CT ABDOMEN PELVIS WO CONTRAST  Result Date: 05/10/2019 CLINICAL DATA:  Fatigue, anemia, colonic mass seen on colonoscopy, history of melanoma EXAM: CT CHEST, ABDOMEN AND PELVIS WITHOUT CONTRAST TECHNIQUE: Multidetector CT imaging of the chest, abdomen and pelvis was performed following the standard protocol without IV contrast. COMPARISON:  06/12/2018 FINDINGS: CT CHEST FINDINGS Cardiovascular: Extensive atherosclerosis of the aorta and coronary vasculature unchanged. No pericardial effusion. Normal caliber of the thoracic aorta. Mediastinum/Nodes: No enlarged mediastinal, hilar, or axillary lymph nodes. Thyroid gland, trachea, and esophagus demonstrate no significant findings. Lungs/Pleura: Tree-in-bud ground-glass nodular airspace disease within the right middle and right lower lobes could reflect aspiration given recent colonoscopy. The appearance is nonspecific and could also reflect underlying inflammation or infection. Left chest is clear. No effusion or pneumothorax. Central airways are patent. Musculoskeletal: No acute or destructive bony lesions. Reconstructed images demonstrate no additional findings. CT ABDOMEN PELVIS FINDINGS Hepatobiliary: No focal liver abnormality is seen. No gallstones, gallbladder wall thickening, or biliary dilatation. Pancreas: Unremarkable. No pancreatic ductal dilatation or surrounding inflammatory changes. Spleen: Normal in size without focal abnormality. Adrenals/Urinary Tract: Stable bilateral renal cortical atrophy. Bilateral renal hypodensities cannot be characterized on this unenhanced exam but appears stable. No urinary tract  calculi or obstructive uropathy. Bladder is unremarkable. The adrenals are normal. Stomach/Bowel: No bowel obstruction or ileus. There is probable wall thickening at the junction of the descending and sigmoid colon with mild pericolonic fat stranding, which could correspond to the reported colonic mass identified at colonoscopy. Evaluation of the colon is limited due to incomplete distension. Vascular/Lymphatic: Aortic atherosclerosis. No enlarged abdominal or pelvic lymph nodes. Reproductive: Prostate is unremarkable. Other: No abdominal wall hernia or abnormality. No abdominopelvic ascites. Musculoskeletal: No acute or destructive bony abnormalities. Reconstructed images demonstrate no additional findings. IMPRESSION: 1. Tree-in-bud ground-glass nodular airspace disease within the right middle and right lower lobes could reflect aspiration given recent colonoscopy. The appearance is nonspecific and could also reflect underlying inflammation or infection. 2. Suspect wall thickening at the junction of the descending and sigmoid colon with mild pericolonic fat stranding, which could correspond to the reported colonic mass identified at colonoscopy. 3. No evidence for metastatic disease. 4. Stable appearance of bilateral renal cortical atrophy and bilateral indeterminate renal hypodensities. 5. Aortic Atherosclerosis (ICD10-I70.0). Electronically Signed   By: Randa Ngo M.D.   On: 05/10/2019 19:56   CT CHEST WO CONTRAST  Result Date: 05/10/2019 CLINICAL DATA:  Fatigue, anemia, colonic mass seen on colonoscopy, history of melanoma  EXAM: CT CHEST, ABDOMEN AND PELVIS WITHOUT CONTRAST TECHNIQUE: Multidetector CT imaging of the chest, abdomen and pelvis was performed following the standard protocol without IV contrast. COMPARISON:  06/12/2018 FINDINGS: CT CHEST FINDINGS Cardiovascular: Extensive atherosclerosis of the aorta and coronary vasculature unchanged. No pericardial effusion. Normal caliber of the thoracic  aorta. Mediastinum/Nodes: No enlarged mediastinal, hilar, or axillary lymph nodes. Thyroid gland, trachea, and esophagus demonstrate no significant findings. Lungs/Pleura: Tree-in-bud ground-glass nodular airspace disease within the right middle and right lower lobes could reflect aspiration given recent colonoscopy. The appearance is nonspecific and could also reflect underlying inflammation or infection. Left chest is clear. No effusion or pneumothorax. Central airways are patent. Musculoskeletal: No acute or destructive bony lesions. Reconstructed images demonstrate no additional findings. CT ABDOMEN PELVIS FINDINGS Hepatobiliary: No focal liver abnormality is seen. No gallstones, gallbladder wall thickening, or biliary dilatation. Pancreas: Unremarkable. No pancreatic ductal dilatation or surrounding inflammatory changes. Spleen: Normal in size without focal abnormality. Adrenals/Urinary Tract: Stable bilateral renal cortical atrophy. Bilateral renal hypodensities cannot be characterized on this unenhanced exam but appears stable. No urinary tract calculi or obstructive uropathy. Bladder is unremarkable. The adrenals are normal. Stomach/Bowel: No bowel obstruction or ileus. There is probable wall thickening at the junction of the descending and sigmoid colon with mild pericolonic fat stranding, which could correspond to the reported colonic mass identified at colonoscopy. Evaluation of the colon is limited due to incomplete distension. Vascular/Lymphatic: Aortic atherosclerosis. No enlarged abdominal or pelvic lymph nodes. Reproductive: Prostate is unremarkable. Other: No abdominal wall hernia or abnormality. No abdominopelvic ascites. Musculoskeletal: No acute or destructive bony abnormalities. Reconstructed images demonstrate no additional findings. IMPRESSION: 1. Tree-in-bud ground-glass nodular airspace disease within the right middle and right lower lobes could reflect aspiration given recent colonoscopy.  The appearance is nonspecific and could also reflect underlying inflammation or infection. 2. Suspect wall thickening at the junction of the descending and sigmoid colon with mild pericolonic fat stranding, which could correspond to the reported colonic mass identified at colonoscopy. 3. No evidence for metastatic disease. 4. Stable appearance of bilateral renal cortical atrophy and bilateral indeterminate renal hypodensities. 5. Aortic Atherosclerosis (ICD10-I70.0). Electronically Signed   By: Randa Ngo M.D.   On: 05/10/2019 19:56    Patriciaann Clan, DO 05/11/2019, 10:44 AM PGY-2, [place] Intern pager: [phone], text pages welcome

## 2019-05-11 NOTE — Consult Note (Signed)
Reason for Consult: Colon mass Referring Physician: Dr. Cleophus Molt Keith [name] is an 79 y.o. male.  HPI: Patient is Keith 79 year old male with Keith history significant for chronic kidney disease, bilateral carotid artery stenosis, CVA-on Eliquis, GERD, Keith. fib, history of melanoma to scalp, who comes in with anemia. Patient states that approximately 2 weeks ago he had some bright red blood per rectum on several bowel movements. Patient was worked up and was to undergo colonoscopy and endoscopy. Patient did have endoscopy on 05/10/2019. He was found to have multiple colon polyps as well as Keith mass in the distal transverse colon that appeared to be malignant-this was tattooed.. Patient biopsies of these which are currently pending. Patient underwent Keith CEA level which is also currently pending. Patient's upper endoscopy revealed no signs of bleeding sources.  Patient denies any other previous abdominal surgery. Patient underwent melanoma removal from apex of his head by Dr. Iran Planas.  General surgery was consulted for further evaluation and management of his colon mass and polyps.    Past Medical History:  Diagnosis Date  . Cataract    planning surgery  . Chronic kidney disease    "weak Kidneys"  . Complication of anesthesia    patient expressed that he has difficulty post anesthesia with swallowing, chewing, and talking after surgery  . GERD (gastroesophageal reflux disease)    tums prn  . Glaucoma   . History of hiatal hernia   . Hypothyroidism   . Hypothyroidism, postsurgical 11/20/2016  . 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 Melanoma ([facility])    scalp  . Melanoma of scalp ([facility]) 04/23/2017  . Obstructive uropathy   . PONV (postoperative nausea and vomiting) 03/20/2017  . Stroke ([facility]) 11/2016   speech and right side at time of stroke 04/20/17- all function return to normal  . Underweight 01/24/2018    Past Surgical History:  Procedure Laterality Date  . APPLICATION OF Keith-CELL OF  HEAD/NECK N/Keith 04/23/2017   Procedure: APPLICATION OF INTREGRA;  Surgeon: Irene Limbo, MD;  Location: [facility];  Service: Plastics;  Laterality: N/Keith;  . CATARACT EXTRACTION    . EXCISION MASS HEAD N/Keith 03/20/2017   Procedure: EXCISION scalp lesion;  Surgeon: Irene Limbo, MD;  Location: [facility];  Service: Plastics;  Laterality: N/Keith;  EXCISION scalp lesion  . EYE SURGERY    . IR ANGIO INTRA EXTRACRAN SEL COM CAROTID INNOMINATE BILAT MOD SED  11/23/2016  . IR ANGIO VERTEBRAL SEL VERTEBRAL UNI L MOD SED  11/23/2016  . LOOP RECORDER INSERTION N/Keith 11/23/2016   Procedure: LOOP RECORDER INSERTION;  Surgeon: [name], MD;  Location: [facility];  Service: Cardiovascular;  Laterality: N/Keith;  . MASS EXCISION N/Keith 04/23/2017   Procedure: EXCISION MALIGNANT LESION OF SCALP;  Surgeon: Irene Limbo, MD;  Location: [facility];  Service: Plastics;  Laterality: N/Keith;  . MELANOMA EXCISION  04/23/2017   BILATERAL SUPERFICIAL PAROTIDECTOMY/NECK SENTINEL NODE BIOPSY WITH FROZEN SECTION; LEFT SELECTIVE NECK DISSECTION  . PAROTIDECTOMY Bilateral 04/23/2017   Procedure: BILATERAL SUPERFICIAL PAROTIDECTOMY/NECK SENTINEL NODE BIOPSY WITH FROZEN SECTION; LEFT SELECTIVE NECK DISSECTION;  Surgeon: [name], MD;  Location: [facility];  Service: ENT;  Laterality: Bilateral;  . SKIN GRAFT SPLIT THICKNESS HEAD / NECK  06/04/2017   SKIN GRAFT SPLIT THICKNESS TO SCALP FROM RIGHT OR LEFT THIGH POSSIBLE Keith CELL TO DONOR SITE  . SKIN SPLIT GRAFT N/Keith 06/04/2017   Procedure: SKIN GRAFT SPLIT THICKNESS TO SCALP FROM RIGHT OR LEFT THIGH POSSIBLE  Keith CELL TO DONOR SITE;  Surgeon: Irene Limbo, MD;  Location: [facility];  Service: Plastics;  Laterality: N/Keith;  . THYROIDECTOMY    . TONSILLECTOMY      Family History  Problem Relation Age of Onset  . Lung cancer Father   . Diabetes Brother     Social History:  reports that he quit smoking about 38 years ago. His smoking use included cigarettes. He has Keith 64.00 pack-year smoking history.  He has never used smokeless tobacco. He reports that he does not drink alcohol or use drugs.  Allergies:  Allergies  Allergen Reactions  . Penicillins Other (See Comments)    UNKNOWN REACTION FROM CHILDHOOD  Has patient had Keith PCN reaction causing immediate rash, facial/tongue/throat swelling, SOB or lightheadedness with hypotension: Unknown Has patient had Keith PCN reaction causing severe rash involving mucus membranes or skin necrosis: Unknown Has patient had Keith PCN reaction that required hospitalization: Unknown Has patient had Keith PCN reaction occurring within the last 10 years: No If all of the above answers are "NO", then may proceed with Cephalosporin use.     Medications: I have reviewed the patient's current medications.  Results for orders placed or performed during the hospital encounter of 05/08/19 (from the past 48 hour(s))  Occult blood card to lab, stool     Status: Abnormal   Collection Time: 05/09/19  2:22 PM  Result Value Ref Range   Fecal Occult Bld POSITIVE (Keith) NEGATIVE    Comment: Performed at [facility], [address], [place], [place] [address]  Hemoglobin and hematocrit, blood     Status: Abnormal   Collection Time: 05/09/19  3:19 PM  Result Value Ref Range   Hemoglobin 9.5 (L) 13.0 - 17.0 g/dL   HCT 30.2 (L) 39.0 - 52.0 %    Comment: Performed at [facility], [place] [address], [place], [place] [address]  Basic metabolic panel     Status: Abnormal   Collection Time: 05/10/19  7:08 AM  Result Value Ref Range   Sodium 145 135 - 145 mmol/L   Potassium 4.7 3.5 - 5.1 mmol/L   Chloride 116 (H) 98 - 111 mmol/L   CO2 18 (L) 22 - 32 mmol/L   Glucose, Bld 116 (H) 70 - 99 mg/dL    Comment: Glucose reference range applies only to samples taken after fasting for at least 8 hours.   BUN 29 (H) 8 - 23 mg/dL   Creatinine, Ser 3.08 (H) 0.61 - 1.24 mg/dL   Calcium 9.1 8.9 - 10.3 mg/dL   GFR calc non Af Amer 19 (L) >60 mL/min   GFR calc Af Amer 21 (L) >60 mL/min    Anion gap 11 5 - 15    Comment: Performed at [facility] [address], [place], [place] [address]  Magnesium     Status: None   Collection Time: 05/10/19  7:08 AM  Result Value Ref Range   Magnesium 2.1 1.7 - 2.4 mg/dL    Comment: Performed at [facility] [address], [place],  [address]  Troponin I (High Sensitivity)     Status: None   Collection Time: 05/10/19  7:08 AM  Result Value Ref Range   Troponin I (High Sensitivity) 9 <18 ng/L    Comment: (NOTE) Elevated high sensitivity troponin I (hsTnI) values and significant  changes across serial measurements may suggest ACS but many other  chronic and acute conditions are known to  elevate hsTnI results.  Refer to the "Links" section for chest pain algorithms and additional  guidance. Performed at [facility], [place] [address] [place], [place] [address]   CBC     Status: Abnormal   Collection Time: 05/10/19  7:08 AM  Result Value Ref Range   WBC 8.8 4.0 - 10.5 K/uL   RBC 3.72 (L) 4.22 - 5.81 MIL/uL   Hemoglobin 10.8 (L) 13.0 - 17.0 g/dL   HCT 35.2 (L) 39.0 - 52.0 %   MCV 94.6 80.0 - 100.0 fL   MCH 29.0 26.0 - 34.0 pg   MCHC 30.7 30.0 - 36.0 g/dL   RDW 14.9 11.5 - 15.5 %   Platelets 264 150 - 400 K/uL   nRBC 0.0 0.0 - 0.2 %    Comment: Performed at [facility], [place] [address], [place], [place] [address]  Troponin I (High Sensitivity)     Status: None   Collection Time: 05/10/19  9:25 AM  Result Value Ref Range   Troponin I (High Sensitivity) 9 <18 ng/L    Comment: (NOTE) Elevated high sensitivity troponin I (hsTnI) values and significant  changes across serial measurements may suggest ACS but many other  chronic and acute conditions are known to elevate hsTnI results.  Refer to the "Links" section for chest pain algorithms and additional  guidance. Performed at [facility], [place] [address] [place], [place] [address]   CBC     Status: Abnormal   Collection Time: 05/10/19  10:37 PM  Result Value Ref Range   WBC 9.6 4.0 - 10.5 K/uL   RBC 3.08 (L) 4.22 - 5.81 MIL/uL   Hemoglobin 8.9 (L) 13.0 - 17.0 g/dL   HCT 28.9 (L) 39.0 - 52.0 %   MCV 93.8 80.0 - 100.0 fL   MCH 28.9 26.0 - 34.0 pg   MCHC 30.8 30.0 - 36.0 g/dL   RDW 15.0 11.5 - 15.5 %   Platelets 175 150 - 400 K/uL   nRBC 0.0 0.0 - 0.2 %    Comment: Performed at [facility], [place] [address], [place], [place] [address]  CBC with Differential/Platelet     Status: Abnormal   Collection Time: 05/11/19  5:02 AM  Result Value Ref Range   WBC 10.2 4.0 - 10.5 K/uL   RBC 3.23 (L) 4.22 - 5.81 MIL/uL   Hemoglobin 9.4 (L) 13.0 - 17.0 g/dL   HCT 31.0 (L) 39.0 - 52.0 %   MCV 96.0 80.0 - 100.0 fL   MCH 29.1 26.0 - 34.0 pg   MCHC 30.3 30.0 - 36.0 g/dL   RDW 14.9 11.5 - 15.5 %   Platelets 182 150 - 400 K/uL   nRBC 0.0 0.0 - 0.2 %   Neutrophils Relative % 83 %   Neutro Abs 8.5 (H) 1.7 - 7.7 K/uL   Lymphocytes Relative 8 %   Lymphs Abs 0.8 0.7 - 4.0 K/uL   Monocytes Relative 8 %   Monocytes Absolute 0.8 0.1 - 1.0 K/uL   Eosinophils Relative 0 %   Eosinophils Absolute 0.0 0.0 - 0.5 K/uL   Basophils Relative 0 %   Basophils Absolute 0.0 0.0 - 0.1 K/uL   Immature Granulocytes 1 %   Abs Immature Granulocytes 0.05 0.00 - 0.07 K/uL    Comment: Performed at [facility], [place] [address], [place], [place] [address]  Basic metabolic panel     Status: Abnormal   Collection Time:  05/11/19  5:02 AM  Result Value Ref Range   Sodium 141 135 - 145 mmol/L   Potassium 4.1 3.5 - 5.1 mmol/L   Chloride 113 (H) 98 - 111 mmol/L   CO2 20 (L) 22 - 32 mmol/L   Glucose, Bld 96 70 - 99 mg/dL    Comment: Glucose reference range applies only to samples taken after fasting for at least 8 hours.   BUN 29 (H) 8 - 23 mg/dL   Creatinine, Ser 3.31 (H) 0.61 - 1.24 mg/dL   Calcium 8.7 (L) 8.9 - 10.3 mg/dL   GFR calc non Af Amer 17 (L) >60 mL/min   GFR calc Af Amer 20 (L) >60 mL/min   Anion gap 8 5 - 15    Comment: Performed  at [facility] [address] [place], [place] [address]    CT ABDOMEN PELVIS WO CONTRAST  Result Date: 05/10/2019 CLINICAL DATA:  Fatigue, anemia, colonic mass seen on colonoscopy, history of melanoma EXAM: CT CHEST, ABDOMEN AND PELVIS WITHOUT CONTRAST TECHNIQUE: Multidetector CT imaging of the chest, abdomen and pelvis was performed following the standard protocol without IV contrast. COMPARISON:  06/12/2018 FINDINGS: CT CHEST FINDINGS Cardiovascular: Extensive atherosclerosis of the aorta and coronary vasculature unchanged. No pericardial effusion. Normal caliber of the thoracic aorta. Mediastinum/Nodes: No enlarged mediastinal, hilar, or axillary lymph nodes. Thyroid gland, trachea, and esophagus demonstrate no significant findings. Lungs/Pleura: Tree-in-bud ground-glass nodular airspace disease within the right middle and right lower lobes could reflect aspiration given recent colonoscopy. The appearance is nonspecific and could also reflect underlying inflammation or infection. Left chest is clear. No effusion or pneumothorax. Central airways are patent. Musculoskeletal: No acute or destructive bony lesions. Reconstructed images demonstrate no additional findings. CT ABDOMEN PELVIS FINDINGS Hepatobiliary: No focal liver abnormality is seen. No gallstones, gallbladder wall thickening, or biliary dilatation. Pancreas: Unremarkable. No pancreatic ductal dilatation or surrounding inflammatory changes. Spleen: Normal in size without focal abnormality. Adrenals/Urinary Tract: Stable bilateral renal cortical atrophy. Bilateral renal hypodensities cannot be characterized on this unenhanced exam but appears stable. No urinary tract calculi or obstructive uropathy. Bladder is unremarkable. The adrenals are normal. Stomach/Bowel: No bowel obstruction or ileus. There is probable wall thickening at the junction of the descending and sigmoid colon with mild pericolonic fat stranding, which could correspond to  the reported colonic mass identified at colonoscopy. Evaluation of the colon is limited due to incomplete distension. Vascular/Lymphatic: Aortic atherosclerosis. No enlarged abdominal or pelvic lymph nodes. Reproductive: Prostate is unremarkable. Other: No abdominal wall hernia or abnormality. No abdominopelvic ascites. Musculoskeletal: No acute or destructive bony abnormalities. Reconstructed images demonstrate no additional findings. IMPRESSION: 1. Tree-in-bud ground-glass nodular airspace disease within the right middle and right lower lobes could reflect aspiration given recent colonoscopy. The appearance is nonspecific and could also reflect underlying inflammation or infection. 2. Suspect wall thickening at the junction of the descending and sigmoid colon with mild pericolonic fat stranding, which could correspond to the reported colonic mass identified at colonoscopy. 3. No evidence for metastatic disease. 4. Stable appearance of bilateral renal cortical atrophy and bilateral indeterminate renal hypodensities. 5. Aortic Atherosclerosis (ICD10-I70.0). Electronically Signed   By: Randa Ngo M.D.   On: 05/10/2019 19:56   CT CHEST WO CONTRAST  Result Date: 05/10/2019 CLINICAL DATA:  Fatigue, anemia, colonic mass seen on colonoscopy, history of melanoma EXAM: CT CHEST, ABDOMEN AND PELVIS WITHOUT CONTRAST TECHNIQUE: Multidetector CT imaging of the chest, abdomen and pelvis was performed following the standard protocol without  IV contrast. COMPARISON:  06/12/2018 FINDINGS: CT CHEST FINDINGS Cardiovascular: Extensive atherosclerosis of the aorta and coronary vasculature unchanged. No pericardial effusion. Normal caliber of the thoracic aorta. Mediastinum/Nodes: No enlarged mediastinal, hilar, or axillary lymph nodes. Thyroid gland, trachea, and esophagus demonstrate no significant findings. Lungs/Pleura: Tree-in-bud ground-glass nodular airspace disease within the right middle and right lower lobes could  reflect aspiration given recent colonoscopy. The appearance is nonspecific and could also reflect underlying inflammation or infection. Left chest is clear. No effusion or pneumothorax. Central airways are patent. Musculoskeletal: No acute or destructive bony lesions. Reconstructed images demonstrate no additional findings. CT ABDOMEN PELVIS FINDINGS Hepatobiliary: No focal liver abnormality is seen. No gallstones, gallbladder wall thickening, or biliary dilatation. Pancreas: Unremarkable. No pancreatic ductal dilatation or surrounding inflammatory changes. Spleen: Normal in size without focal abnormality. Adrenals/Urinary Tract: Stable bilateral renal cortical atrophy. Bilateral renal hypodensities cannot be characterized on this unenhanced exam but appears stable. No urinary tract calculi or obstructive uropathy. Bladder is unremarkable. The adrenals are normal. Stomach/Bowel: No bowel obstruction or ileus. There is probable wall thickening at the junction of the descending and sigmoid colon with mild pericolonic fat stranding, which could correspond to the reported colonic mass identified at colonoscopy. Evaluation of the colon is limited due to incomplete distension. Vascular/Lymphatic: Aortic atherosclerosis. No enlarged abdominal or pelvic lymph nodes. Reproductive: Prostate is unremarkable. Other: No abdominal wall hernia or abnormality. No abdominopelvic ascites. Musculoskeletal: No acute or destructive bony abnormalities. Reconstructed images demonstrate no additional findings. IMPRESSION: 1. Tree-in-bud ground-glass nodular airspace disease within the right middle and right lower lobes could reflect aspiration given recent colonoscopy. The appearance is nonspecific and could also reflect underlying inflammation or infection. 2. Suspect wall thickening at the junction of the descending and sigmoid colon with mild pericolonic fat stranding, which could correspond to the reported colonic mass identified at  colonoscopy. 3. No evidence for metastatic disease. 4. Stable appearance of bilateral renal cortical atrophy and bilateral indeterminate renal hypodensities. 5. Aortic Atherosclerosis (ICD10-I70.0). Electronically Signed   By: Randa Ngo M.D.   On: 05/10/2019 19:56    Review of Systems  Constitutional: Negative for chills and fever.  HENT: Negative for ear discharge, hearing loss and sore throat.   Eyes: Negative for discharge.  Respiratory: Negative for cough and shortness of breath.   Cardiovascular: Negative for chest pain and leg swelling.  Gastrointestinal: Negative for abdominal pain, constipation, diarrhea, nausea and vomiting.  Musculoskeletal: Negative for myalgias and neck pain.  Skin: Negative for rash.  Allergic/Immunologic: Negative for environmental allergies.  Neurological: Negative for dizziness and seizures.  Hematological: Does not bruise/bleed easily.  Psychiatric/Behavioral: Negative for suicidal ideas.  All other systems reviewed and are negative.  Blood pressure 95/61, pulse 60, temperature 98 F (36.7 C), temperature source Oral, resp. rate 18, weight 51.7 kg, SpO2 98 %. Physical Exam  Constitutional: He is oriented to person, place, and time. Vital signs are normal. He appears well-developed and well-nourished.  Conversant No acute distress  Eyes: Lids are normal. No scleral icterus.  Pupils are equal round and reactive No lid lag Moist conjunctiva  Neck: No tracheal tenderness present. No thyromegaly present.  No cervical lymphadenopathy  Cardiovascular: Normal rate, regular rhythm and intact distal pulses.  No murmur heard. Respiratory: Effort normal and breath sounds normal. He has no wheezes. He has no rales.  GI: There is no hepatosplenomegaly. There is no abdominal tenderness. No hernia.  Neurological: He is alert and oriented to person, place, and time.  Normal gait and station  Skin: Skin is warm. No rash noted. No cyanosis. Nails show no  clubbing.  Normal skin turgor  Psychiatric: Judgment normal.  Appropriate affect    Assessment/Plan: 79 year old male with transverse colon mass, multiple colon polyps History of CVA-on Eliquis, was last on this on day of admission. Carotid artery stenosis GERD Chronic kidney disease Keith. fib History of melanoma  1. We will follow up on his CEA level, and pathology reports. 2. Patient is in favor of surgery if pathology report does reveal cancer. This appears to be the patient's only source of of blood loss at this time. 3. We will follow along.  Keith [name] 05/11/2019, 12:48 PM

## 2019-05-11 NOTE — Plan of Care (Signed)
°  Problem: Coping: °Goal: Level of anxiety will decrease °Outcome: Progressing °  °

## 2019-05-11 NOTE — Plan of Care (Signed)
  Problem: Education: Goal: Knowledge of General Education information will improve Description Including pain rating scale, medication(s)/side effects and non-pharmacologic comfort measures Outcome: Progressing   

## 2019-05-11 NOTE — Progress Notes (Addendum)
Daily Rounding Note  05/11/2019, 10:21 AM  LOS: 2 days   SUBJECTIVE:   Chief complaint:: Tumor, colon polyps    Passing gas and some stool after the procedure.  Some discomfort in the lower abdomen which is not severe since the procedure.  Tolerating solid food.  OBJECTIVE:         Vital signs in last 24 hours:    Temp:  [98 F (36.7 C)-99.3 F (37.4 C)] 98 F (36.7 C) (04/25 0929) Pulse Rate:  [60-96] 60 (04/25 0929) Resp:  [15-18] 18 (04/25 0929) BP: (93-136)/(32-95) 95/61 (04/25 0929) SpO2:  [95 %-100 %] 98 % (04/25 0929) Weight:  [51.7 kg] 51.7 kg (04/25 0500) Last BM Date: 05/08/19 Filed Weights   05/09/19 0500 05/10/19 2106 05/11/19 0500  Weight: 51.7 kg 51.7 kg 51.7 kg   General: Frail, thin, comfortable, alert.  Looks older than stated age. Bandages covering top of head where he just had a melanoma removed. Heart: RRR. Chest: Clear bilaterally.  No labored breathing or cough. Abdomen: Soft, nontender, nondistended Extremities: No CCE. Neuro/Psych: Pleasant, calm, cooperative.  Fully alert and oriented.  Intake/Output from previous day: 04/24 0701 - 04/25 0700 In: 1787 [P.O.:100; I.V.:200; IV Piggyback:250] Out: 0   Intake/Output this shift: Total I/O In: 120 [P.O.:120] Out: 0   Lab Results: Recent Labs    05/10/19 0708 05/10/19 2237 05/11/19 0502  WBC 8.8 9.6 10.2  HGB 10.8* 8.9* 9.4*  HCT 35.2* 28.9* 31.0*  PLT 264 175 182   BMET Recent Labs    05/09/19 0435 05/10/19 0708 05/11/19 0502  NA 141 145 141  K 4.3 4.7 4.1  CL 112* 116* 113*  CO2 23 18* 20*  GLUCOSE 84 116* 96  BUN 33* 29* 29*  CREATININE 2.76* 3.08* 3.31*  CALCIUM 8.8* 9.1 8.7*   LFT Recent Labs    05/08/19 1858  PROT 5.9*  ALBUMIN 3.0*  AST 17  ALT 9  ALKPHOS 64  BILITOT 0.2*   PT/INR No results for input(s): LABPROT, INR in the last 72 hours. Hepatitis Panel No results for input(s): HEPBSAG, HCVAB,  HEPAIGM, HEPBIGM in the last 72 hours.  Studies/Results: CT ABDOMEN PELVIS WO CONTRAST  Result Date: 05/10/2019 CLINICAL DATA:  Fatigue, anemia, colonic mass seen on colonoscopy, history of melanoma EXAM: CT CHEST, ABDOMEN AND PELVIS WITHOUT CONTRAST TECHNIQUE: Multidetector CT imaging of the chest, abdomen and pelvis was performed following the standard protocol without IV contrast. COMPARISON:  06/12/2018 FINDINGS: CT CHEST FINDINGS Cardiovascular: Extensive atherosclerosis of the aorta and coronary vasculature unchanged. No pericardial effusion. Normal caliber of the thoracic aorta. Mediastinum/Nodes: No enlarged mediastinal, hilar, or axillary lymph nodes. Thyroid gland, trachea, and esophagus demonstrate no significant findings. Lungs/Pleura: Tree-in-bud ground-glass nodular airspace disease within the right middle and right lower lobes could reflect aspiration given recent colonoscopy. The appearance is nonspecific and could also reflect underlying inflammation or infection. Left chest is clear. No effusion or pneumothorax. Central airways are patent. Musculoskeletal: No acute or destructive bony lesions. Reconstructed images demonstrate no additional findings. CT ABDOMEN PELVIS FINDINGS Hepatobiliary: No focal liver abnormality is seen. No gallstones, gallbladder wall thickening, or biliary dilatation. Pancreas: Unremarkable. No pancreatic ductal dilatation or surrounding inflammatory changes. Spleen: Normal in size without focal abnormality. Adrenals/Urinary Tract: Stable bilateral renal cortical atrophy. Bilateral renal hypodensities cannot be characterized on this unenhanced exam but appears stable. No urinary tract calculi or obstructive uropathy. Bladder is unremarkable. The adrenals are normal.  Stomach/Bowel: No bowel obstruction or ileus. There is probable wall thickening at the junction of the descending and sigmoid colon with mild pericolonic fat stranding, which could correspond to the reported  colonic mass identified at colonoscopy. Evaluation of the colon is limited due to incomplete distension. Vascular/Lymphatic: Aortic atherosclerosis. No enlarged abdominal or pelvic lymph nodes. Reproductive: Prostate is unremarkable. Other: No abdominal wall hernia or abnormality. No abdominopelvic ascites. Musculoskeletal: No acute or destructive bony abnormalities. Reconstructed images demonstrate no additional findings. IMPRESSION: 1. Tree-in-bud ground-glass nodular airspace disease within the right middle and right lower lobes could reflect aspiration given recent colonoscopy. The appearance is nonspecific and could also reflect underlying inflammation or infection. 2. Suspect wall thickening at the junction of the descending and sigmoid colon with mild pericolonic fat stranding, which could correspond to the reported colonic mass identified at colonoscopy. 3. No evidence for metastatic disease. 4. Stable appearance of bilateral renal cortical atrophy and bilateral indeterminate renal hypodensities. 5. Aortic Atherosclerosis (ICD10-I70.0). Electronically Signed   By: Randa Ngo M.D.   On: 05/10/2019 19:56   CT CHEST WO CONTRAST  Result Date: 05/10/2019 CLINICAL DATA:  Fatigue, anemia, colonic mass seen on colonoscopy, history of melanoma EXAM: CT CHEST, ABDOMEN AND PELVIS WITHOUT CONTRAST TECHNIQUE: Multidetector CT imaging of the chest, abdomen and pelvis was performed following the standard protocol without IV contrast. COMPARISON:  06/12/2018 FINDINGS: CT CHEST FINDINGS Cardiovascular: Extensive atherosclerosis of the aorta and coronary vasculature unchanged. No pericardial effusion. Normal caliber of the thoracic aorta. Mediastinum/Nodes: No enlarged mediastinal, hilar, or axillary lymph nodes. Thyroid gland, trachea, and esophagus demonstrate no significant findings. Lungs/Pleura: Tree-in-bud ground-glass nodular airspace disease within the right middle and right lower lobes could reflect  aspiration given recent colonoscopy. The appearance is nonspecific and could also reflect underlying inflammation or infection. Left chest is clear. No effusion or pneumothorax. Central airways are patent. Musculoskeletal: No acute or destructive bony lesions. Reconstructed images demonstrate no additional findings. CT ABDOMEN PELVIS FINDINGS Hepatobiliary: No focal liver abnormality is seen. No gallstones, gallbladder wall thickening, or biliary dilatation. Pancreas: Unremarkable. No pancreatic ductal dilatation or surrounding inflammatory changes. Spleen: Normal in size without focal abnormality. Adrenals/Urinary Tract: Stable bilateral renal cortical atrophy. Bilateral renal hypodensities cannot be characterized on this unenhanced exam but appears stable. No urinary tract calculi or obstructive uropathy. Bladder is unremarkable. The adrenals are normal. Stomach/Bowel: No bowel obstruction or ileus. There is probable wall thickening at the junction of the descending and sigmoid colon with mild pericolonic fat stranding, which could correspond to the reported colonic mass identified at colonoscopy. Evaluation of the colon is limited due to incomplete distension. Vascular/Lymphatic: Aortic atherosclerosis. No enlarged abdominal or pelvic lymph nodes. Reproductive: Prostate is unremarkable. Other: No abdominal wall hernia or abnormality. No abdominopelvic ascites. Musculoskeletal: No acute or destructive bony abnormalities. Reconstructed images demonstrate no additional findings. IMPRESSION: 1. Tree-in-bud ground-glass nodular airspace disease within the right middle and right lower lobes could reflect aspiration given recent colonoscopy. The appearance is nonspecific and could also reflect underlying inflammation or infection. 2. Suspect wall thickening at the junction of the descending and sigmoid colon with mild pericolonic fat stranding, which could correspond to the reported colonic mass identified at  colonoscopy. 3. No evidence for metastatic disease. 4. Stable appearance of bilateral renal cortical atrophy and bilateral indeterminate renal hypodensities. 5. Aortic Atherosclerosis (ICD10-I70.0). Electronically Signed   By: Randa Ngo M.D.   On: 05/10/2019 19:56    ASSESMENT:   *   Symptomatic anemia.  FOBT +.  History of bleeding per rectum. Hgb 7 >> 2 PRBC >> 9.4.   05/10/2019 colonoscopy with malignant-looking tumor distal transverse colon, biopsied, tattooed.  Resection of three, 4 to 6 mm, polyps in the ascending colon.  Resected chin of 12 mm polyp in the distal sigmoid, area was tattooed.  Mild diverticulosis. 05/10/2019 EGD.  Patent Schatzki's ring at GE junction.  4 cm hiatal hernia.  Grossly normal stomach and duodenal, both areas biopsied. CTAP and chest shows ground glass nodular airspace disease in right mid and lower lobes, possibly reflecting aspiration but appearance nonspecific suspicion for thickening in the colon at the descending and sigmoid region with mild pericolonic fat stranding, could correspond with the mass identified at colonoscopy.  No evidence for mets.  Renal atrophy bilaterally.  Aortic atherosclerosis.  PLAN   *   Needs gen surgery consult called re colon mass. Resident aware.     *   GI will follow up once pathology reported.   Azucena Freed  05/11/2019, 10:21 AM Phone [phone]    Attending physician's note   I have taken an interval history, reviewed the chart and examined the patient. I agree with the Advanced Practitioner's note, impression and recommendations.   Colon mass-distal transverse (biopsied and tattooed). CT chest/AP neg for mets Sigmoid polyp s/p polypectomy (area also tattooed) Hiatal hernia Recent H/O melanoma  Plan: -Follow bx and CEA -Surgery on board -He already follows up with Dr. Alan Ripper for melanoma.  -Agree to hold Eliquis for now. -Discussed in detail with patient and patient's family. -Pl call if with any  ?    Carmell Austria, MD Velora Heckler Fabienne Bruns [phone].

## 2019-05-12 ENCOUNTER — Other Ambulatory Visit: Payer: Self-pay | Admitting: Family Medicine

## 2019-05-12 ENCOUNTER — Encounter: Payer: Self-pay | Admitting: *Deleted

## 2019-05-12 DIAGNOSIS — K449 Diaphragmatic hernia without obstruction or gangrene: Secondary | ICD-10-CM

## 2019-05-12 DIAGNOSIS — K219 Gastro-esophageal reflux disease without esophagitis: Secondary | ICD-10-CM

## 2019-05-12 LAB — CBC
HCT: 31.3 % — ABNORMAL LOW (ref 39.0–52.0)
Hemoglobin: 9.4 g/dL — ABNORMAL LOW (ref 13.0–17.0)
MCH: 28.8 pg (ref 26.0–34.0)
MCHC: 30 g/dL (ref 30.0–36.0)
MCV: 96 fL (ref 80.0–100.0)
Platelets: 175 10*3/uL (ref 150–400)
RBC: 3.26 MIL/uL — ABNORMAL LOW (ref 4.22–5.81)
RDW: 14.5 % (ref 11.5–15.5)
WBC: 6.1 10*3/uL (ref 4.0–10.5)
nRBC: 0 % (ref 0.0–0.2)

## 2019-05-12 LAB — BPAM RBC
Blood Product Expiration Date: 202105212359
Blood Product Expiration Date: 202105212359
Blood Product Expiration Date: 202105212359
ISSUE DATE / TIME: 202104201223
ISSUE DATE / TIME: 202104222050
ISSUE DATE / TIME: 202104230958
Unit Type and Rh: 5100
Unit Type and Rh: 5100
Unit Type and Rh: 5100

## 2019-05-12 LAB — BASIC METABOLIC PANEL
Anion gap: 9 (ref 5–15)
BUN: 32 mg/dL — ABNORMAL HIGH (ref 8–23)
CO2: 17 mmol/L — ABNORMAL LOW (ref 22–32)
Calcium: 8.3 mg/dL — ABNORMAL LOW (ref 8.9–10.3)
Chloride: 114 mmol/L — ABNORMAL HIGH (ref 98–111)
Creatinine, Ser: 3.34 mg/dL — ABNORMAL HIGH (ref 0.61–1.24)
GFR calc Af Amer: 19 mL/min — ABNORMAL LOW (ref >60)
GFR calc non Af Amer: 17 mL/min — ABNORMAL LOW (ref >60)
Glucose, Bld: 78 mg/dL (ref 70–99)
Potassium: 4.5 mmol/L (ref 3.5–5.1)
Sodium: 140 mmol/L (ref 135–145)

## 2019-05-12 LAB — TYPE AND SCREEN
ABO/RH(D): O POS
Antibody Screen: NEGATIVE
Unit division: 0
Unit division: 0
Unit division: 0

## 2019-05-12 LAB — URINALYSIS, ROUTINE W REFLEX MICROSCOPIC
Bilirubin Urine: NEGATIVE
Glucose, UA: NEGATIVE mg/dL
Hgb urine dipstick: NEGATIVE
Ketones, ur: NEGATIVE mg/dL
Leukocytes,Ua: NEGATIVE
Nitrite: NEGATIVE
Protein, ur: NEGATIVE mg/dL
Specific Gravity, Urine: 1.01 (ref 1.005–1.030)
pH: 5 (ref 5.0–8.0)

## 2019-05-12 LAB — CEA: CEA: 1.7 ng/mL (ref 0.0–4.7)

## 2019-05-12 NOTE — Hospital Course (Addendum)
Keith [name] is a 79 y.o. male who presented from [facility] for worsening fatigue. PMH is significant for CKD IV, bilateral carotid artery stenosis L>R, CVA in 11/2016, GERD, HLD, h/o hypotension, iron deficiency, vitamin D deficiency, underweight, SAH, paroxysmal a-fib, multiple melanoma's of the scalp (March and April 2019), h/o urinary outlet obstruction s/p transurethral vaporization of the prostate in 2012.    Worsening Fatigue  Symptomatic Anemia colonic mass found Patient presenting today from [facility] with progressively worsening fatigue in the setting of recent BRBPR on Eliquis and anemia with a hemoglobin of 7.7>7.0 (last Hgb 8.3 in March 2021, baseline appears to be 11-12). Patient is otherwise asymptomatic without any other signs of acute bleed. Denies excessive use of NSAIDs. He is currently on iron supplement but denies any melena.  He was transfused with 2 units of PRBCs and hemoglobin 9.5 s/p transfusion.  GI was consulted and recommended colonoscopy and EGD.  On day 3 of admission he underwent an upper GI which is impressive for 4 cm hiatal hernia but otherwise normal and no upper GI bleeding.  A colonoscopy was also performed the same day and showed malignant appearing tumor in the distal colon.  3 polyps were removed from the ascending colon and a 12 mm polyp was removed in the distal sigmoid.  Biopsies were taken.  Mild diverticulosis was also noted.  Given the malignant appearing tumor CT chest/abdomen and pelvis was obtained which showed no evidence of static disease.  A surgery consult was placed and will follow up pending pathology report.  CEA pending.  On the day of discharge hemoglobin was stable at 9.4.  Patient did not have any further episodes of bleeding.  He was discharged home in stable condition.

## 2019-05-12 NOTE — Discharge Summary (Signed)
[facility] Discharge Summary  Patient name: Keith [name] Medical record number: [id number] Date of birth: [birth year] Age: 79 y.o. Gender: male Date of Admission: 05/08/2019  Date of Discharge: 05/12/2019 Admitting Physician: Danna Hefty, DO  Primary Care Provider: Bonnita Hollow, MD Consultants: GI, General Surgery   Indication for Hospitalization: symptomatic anemia   Discharge Diagnoses/Problem List:  Active Problems:   Malignant melanoma of skin of scalp (HCC)   CKD (chronic kidney disease) stage 4, GFR 15-29 ml/min (HCC)   Symptomatic anemia   Mass of colon  Disposition: Home  Discharge Condition: Stable  Discharge Exam: 05/12/2019 Performed by Dr. Rosita Fire: General: Cachectic elderly male sitting up in bed in NAD HEENT: Bandage on scalp melanoma, conjunctival pallor Cardiac: Regular rate and rhythm, bilateral radial pulses palpable Lungs: Clear to auscultation without wheezing, stable on room air, no crackles Abdomen: Nontender to palpation, abdomen soft bowel sounds present Msk: Thin extremities without lower extremity edema  Brief Hospital Course:  Keith [name] is a 79 y.o. male who presented from [facility] for worsening fatigue. PMH is significant for CKD IV, bilateral carotid artery stenosis L>R, CVA in 11/2016, GERD, HLD, h/o hypotension, iron deficiency, vitamin D deficiency, underweight, SAH, paroxysmal a-fib, multiple melanoma's of the scalp (March and April 2019), h/o urinary outlet obstruction s/p transurethral vaporization of the prostate in 2012.    Worsening Fatigue  Symptomatic Anemia colonic mass found Patient presenting today from [facility] with progressively worsening fatigue in the setting of recent BRBPR on Eliquis and anemia with a hemoglobin of 7.7>7.0 (last Hgb 8.3 in March 2021, baseline appears to be 11-12). Patient is otherwise asymptomatic without any other signs of acute bleed. Denies excessive use of  NSAIDs. He is currently on iron supplement but denies any melena.  He was transfused with 2 units of PRBCs and hemoglobin 9.5 s/p transfusion.  GI was consulted and recommended colonoscopy and EGD.  On day 3 of admission he underwent an upper GI which is impressive for 4 cm hiatal hernia but otherwise normal and no upper GI bleeding.  A colonoscopy was also performed the same day and showed malignant appearing tumor in the distal colon.  3 polyps were removed from the ascending colon and a 12 mm polyp was removed in the distal sigmoid.  Biopsies were taken.  Mild diverticulosis was also noted.  Given the malignant appearing tumor CT chest/abdomen and pelvis was obtained which showed no evidence of static disease.  A surgery consult was placed and will follow up pending pathology report.  CEA pending.  On the day of discharge hemoglobin was stable at 9.4.  Patient did not have any further episodes of bleeding.  He was discharged home in stable condition.    Issues for Follow Up:  1. Recommend a CMP to check patient's Creatinine as it was elevated to 3.34 during this admission.  2. Recommend completing a CBC in order to check patient's hemoglobin. Patient required 2units of blood during this admission.  3. Please determine when the patient should restart his Eliquis (for atrial fibrillation). He was instructed to stop this medication due to low hemoglobin. If the patient is scheduled for surgery to resect the colon mass, please continue to hold this medication.  Significant Procedures:   Blood transfusions (2) units on 4/22 and 4/23.   Significant Labs and Imaging:  Recent Labs  Lab 05/10/19 2237 05/11/19 0502 05/12/19 0838  WBC 9.6 10.2 6.1  HGB 8.9* 9.4* 9.4*  HCT 28.9* 31.0* 31.3*  PLT 175 182 175   Recent Labs  Lab 05/08/19 1858 05/08/19 1858 05/09/19 0435 05/09/19 0435 05/10/19 0708 05/10/19 0708 05/11/19 0502 05/12/19 0618  NA 136  --  141  --  145  --  141 140  K 3.9   < > 4.3    < > 4.7   < > 4.1 4.5  CL 104  --  112*  --  116*  --  113* 114*  CO2 21*  --  23  --  18*  --  20* 17*  GLUCOSE 155*  --  84  --  116*  --  96 78  BUN 33*  --  33*  --  29*  --  29* 32*  CREATININE 2.53*  --  2.76*  --  3.08*  --  3.31* 3.34*  CALCIUM 8.6*  --  8.8*  --  9.1  --  8.7* 8.3*  MG 2.1  --   --   --  2.1  --   --   --   PHOS 4.7*  --   --   --   --   --   --   --   ALKPHOS 64  --   --   --   --   --   --   --   AST 17  --   --   --   --   --   --   --   ALT 9  --   --   --   --   --   --   --   ALBUMIN 3.0*  --   --   --   --   --   --   --    < > = values in this interval not displayed.    Results/Tests Pending at Time of Discharge: Pathology for colon mass  Discharge Medications:  Allergies as of 05/12/2019      Reactions   Penicillins Other (See Comments)   UNKNOWN REACTION FROM CHILDHOOD  Has patient had a PCN reaction causing immediate rash, facial/tongue/throat swelling, SOB or lightheadedness with hypotension: Unknown Has patient had a PCN reaction causing severe rash involving mucus membranes or skin necrosis: Unknown Has patient had a PCN reaction that required hospitalization: Unknown Has patient had a PCN reaction occurring within the last 10 years: No If all of the above answers are "NO", then may proceed with Cephalosporin use.      Medication List    STOP taking these medications   Eliquis 2.5 MG Tabs tablet Generic drug: apixaban   Medical Compression Stockings Misc     TAKE these medications   atorvastatin 40 MG tablet Commonly known as: LIPITOR TAKE 1 TABLET (40 MG TOTAL) BY MOUTH DAILY AT 6 PM. What changed: when to take this   Dapsone 5 % topical gel Apply 1 application topically 2 (two) times daily.   ergocalciferol 1.25 MG (50000 UT) capsule Commonly known as: VITAMIN D2 Take by mouth.   famotidine 20 MG tablet Commonly known as: PEPCID TAKE 1 TABLET BY MOUTH TWICE A DAY   ferrous sulfate 325 (65 FE) MG tablet Take by  mouth.   multivitamin capsule Take by mouth.   mupirocin ointment 2 % Commonly known as: BACTROBAN Place 1 application into the nose daily.   timolol 0.5 % ophthalmic solution Commonly known as: TIMOPTIC Place 1 drop into both eyes 2 (two) times daily.  Discharge Instructions: Please refer to Patient Instructions section of EMR for full details.  Patient was counseled important signs and symptoms that should prompt return to medical care, changes in medications, dietary instructions, activity restrictions, and follow up appointments.   Follow-Up Appointments: Follow-up Information    Shirley, [place], DO. Go on 05/15/2019.   Specialty: Family Medicine Why: Please arrive by 2:15PM for your appointment with Dr. Enid Derry.  Contact information: [address] [place] [address] [phone]           Carollee Leitz, MD 05/14/2019, 3:29 AM PGY-1, [facility]

## 2019-05-12 NOTE — Progress Notes (Signed)
2 Days Post-Op   Subjective/Chief Complaint: No complaints No gross bleeding from below   Objective: Vital signs in last 24 hours: Temp:  [97.7 F (36.5 C)-98 F (36.7 C)] 97.7 F (36.5 C) (04/25 2154) Pulse Rate:  [57-63] 57 (04/25 2154) Resp:  [16-18] 16 (04/25 2154) BP: (95-105)/(52-65) 104/52 (04/25 2154) SpO2:  [97 %-99 %] 99 % (04/25 2154) Weight:  [51.7 kg] 51.7 kg (04/26 0500) Last BM Date: 05/08/19  Intake/Output from previous day: 04/25 0701 - 04/26 0700 In: 1799 [P.O.:440; I.V.:1359] Out: 0  Intake/Output this shift: No intake/output data recorded.  Exam: Awake and alert, eating Abdomen soft, NT/ND Very thin individual  Lab Results:  Recent Labs    05/10/19 2237 05/11/19 0502  WBC 9.6 10.2  HGB 8.9* 9.4*  HCT 28.9* 31.0*  PLT 175 182   BMET Recent Labs    05/11/19 0502 05/12/19 0618  NA 141 140  K 4.1 4.5  CL 113* 114*  CO2 20* 17*  GLUCOSE 96 78  BUN 29* 32*  CREATININE 3.31* 3.34*  CALCIUM 8.7* 8.3*   PT/INR No results for input(s): LABPROT, INR in the last 72 hours. ABG No results for input(s): PHART, HCO3 in the last 72 hours.  Invalid input(s): PCO2, PO2  Studies/Results: CT ABDOMEN PELVIS WO CONTRAST  Result Date: 05/10/2019 CLINICAL DATA:  Fatigue, anemia, colonic mass seen on colonoscopy, history of melanoma EXAM: CT CHEST, ABDOMEN AND PELVIS WITHOUT CONTRAST TECHNIQUE: Multidetector CT imaging of the chest, abdomen and pelvis was performed following the standard protocol without IV contrast. COMPARISON:  06/12/2018 FINDINGS: CT CHEST FINDINGS Cardiovascular: Extensive atherosclerosis of the aorta and coronary vasculature unchanged. No pericardial effusion. Normal caliber of the thoracic aorta. Mediastinum/Nodes: No enlarged mediastinal, hilar, or axillary lymph nodes. Thyroid gland, trachea, and esophagus demonstrate no significant findings. Lungs/Pleura: Tree-in-bud ground-glass nodular airspace disease within the right middle and  right lower lobes could reflect aspiration given recent colonoscopy. The appearance is nonspecific and could also reflect underlying inflammation or infection. Left chest is clear. No effusion or pneumothorax. Central airways are patent. Musculoskeletal: No acute or destructive bony lesions. Reconstructed images demonstrate no additional findings. CT ABDOMEN PELVIS FINDINGS Hepatobiliary: No focal liver abnormality is seen. No gallstones, gallbladder wall thickening, or biliary dilatation. Pancreas: Unremarkable. No pancreatic ductal dilatation or surrounding inflammatory changes. Spleen: Normal in size without focal abnormality. Adrenals/Urinary Tract: Stable bilateral renal cortical atrophy. Bilateral renal hypodensities cannot be characterized on this unenhanced exam but appears stable. No urinary tract calculi or obstructive uropathy. Bladder is unremarkable. The adrenals are normal. Stomach/Bowel: No bowel obstruction or ileus. There is probable wall thickening at the junction of the descending and sigmoid colon with mild pericolonic fat stranding, which could correspond to the reported colonic mass identified at colonoscopy. Evaluation of the colon is limited due to incomplete distension. Vascular/Lymphatic: Aortic atherosclerosis. No enlarged abdominal or pelvic lymph nodes. Reproductive: Prostate is unremarkable. Other: No abdominal wall hernia or abnormality. No abdominopelvic ascites. Musculoskeletal: No acute or destructive bony abnormalities. Reconstructed images demonstrate no additional findings. IMPRESSION: 1. Tree-in-bud ground-glass nodular airspace disease within the right middle and right lower lobes could reflect aspiration given recent colonoscopy. The appearance is nonspecific and could also reflect underlying inflammation or infection. 2. Suspect wall thickening at the junction of the descending and sigmoid colon with mild pericolonic fat stranding, which could correspond to the reported  colonic mass identified at colonoscopy. 3. No evidence for metastatic disease. 4. Stable appearance of bilateral renal cortical atrophy  and bilateral indeterminate renal hypodensities. 5. Aortic Atherosclerosis (ICD10-I70.0). Electronically Signed   By: Randa Ngo M.D.   On: 05/10/2019 19:56   CT CHEST WO CONTRAST  Result Date: 05/10/2019 CLINICAL DATA:  Fatigue, anemia, colonic mass seen on colonoscopy, history of melanoma EXAM: CT CHEST, ABDOMEN AND PELVIS WITHOUT CONTRAST TECHNIQUE: Multidetector CT imaging of the chest, abdomen and pelvis was performed following the standard protocol without IV contrast. COMPARISON:  06/12/2018 FINDINGS: CT CHEST FINDINGS Cardiovascular: Extensive atherosclerosis of the aorta and coronary vasculature unchanged. No pericardial effusion. Normal caliber of the thoracic aorta. Mediastinum/Nodes: No enlarged mediastinal, hilar, or axillary lymph nodes. Thyroid gland, trachea, and esophagus demonstrate no significant findings. Lungs/Pleura: Tree-in-bud ground-glass nodular airspace disease within the right middle and right lower lobes could reflect aspiration given recent colonoscopy. The appearance is nonspecific and could also reflect underlying inflammation or infection. Left chest is clear. No effusion or pneumothorax. Central airways are patent. Musculoskeletal: No acute or destructive bony lesions. Reconstructed images demonstrate no additional findings. CT ABDOMEN PELVIS FINDINGS Hepatobiliary: No focal liver abnormality is seen. No gallstones, gallbladder wall thickening, or biliary dilatation. Pancreas: Unremarkable. No pancreatic ductal dilatation or surrounding inflammatory changes. Spleen: Normal in size without focal abnormality. Adrenals/Urinary Tract: Stable bilateral renal cortical atrophy. Bilateral renal hypodensities cannot be characterized on this unenhanced exam but appears stable. No urinary tract calculi or obstructive uropathy. Bladder is unremarkable.  The adrenals are normal. Stomach/Bowel: No bowel obstruction or ileus. There is probable wall thickening at the junction of the descending and sigmoid colon with mild pericolonic fat stranding, which could correspond to the reported colonic mass identified at colonoscopy. Evaluation of the colon is limited due to incomplete distension. Vascular/Lymphatic: Aortic atherosclerosis. No enlarged abdominal or pelvic lymph nodes. Reproductive: Prostate is unremarkable. Other: No abdominal wall hernia or abnormality. No abdominopelvic ascites. Musculoskeletal: No acute or destructive bony abnormalities. Reconstructed images demonstrate no additional findings. IMPRESSION: 1. Tree-in-bud ground-glass nodular airspace disease within the right middle and right lower lobes could reflect aspiration given recent colonoscopy. The appearance is nonspecific and could also reflect underlying inflammation or infection. 2. Suspect wall thickening at the junction of the descending and sigmoid colon with mild pericolonic fat stranding, which could correspond to the reported colonic mass identified at colonoscopy. 3. No evidence for metastatic disease. 4. Stable appearance of bilateral renal cortical atrophy and bilateral indeterminate renal hypodensities. 5. Aortic Atherosclerosis (ICD10-I70.0). Electronically Signed   By: Randa Ngo M.D.   On: 05/10/2019 19:56    Anti-infectives: Anti-infectives (From admission, onward)   None      Assessment/Plan: s/p Procedure(s): ESOPHAGOGASTRODUODENOSCOPY (EGD) WITH PROPOFOL (N/A) COLONOSCOPY WITH PROPOFOL (N/A) BIOPSY POLYPECTOMY SUBMUCOSAL TATTOO INJECTION  Colon mass and polyps  CEA normal Awaiting final biopsy results. CBC pending this morning Creatinine till high. We will follow  LOS: 3 days    Coralie Keens 05/12/2019

## 2019-05-12 NOTE — Discharge Instructions (Signed)
Thank you for choosing [facility] for your healthcare needs.  You were admitted to the hospital due to symptomatic anemia.  You were treated with blood transfusions and your hemoglobin improved to a normal level.  We have made an appointment at the family medicine center for you on 05/15/2019 at 2:30 PM.  Please attend his appointment as we will complete some follow-up blood work to check your kidneys and your blood count numbers.  You are also evaluated by gastroenterology with a colonoscopy and we are now awaiting the results of surgical pathology to determine if you will need surgery to resect the mass found on this study.  We recommend that you stop taking the Eliquis until we know the results of your pathology and decided on surgery or not. We will notify your physician at your follow up visit to instruct you on when to restart this medication.   Please return to care if he began to experience dizziness with standing, overwhelming weakness, worsening shortness of breath or chest pain.  [place] [phone]

## 2019-05-12 NOTE — Progress Notes (Signed)
Family Medicine Teaching Service Daily Progress Note Intern Pager: [phone]  Patient name: Keith [name] Medical record number: [id number] Date of birth: 1940/08/04 Age: 79 y.o. Gender: male  Primary Care Provider: Bonnita Hollow, MD Consultants: GI Code Status: DNR  Pt Overview and Major Events to Date:  04/22- Admitted  Assessment and Plan: Keith [name] is a 79 y.o. male presenting with a subacute history of hematochezia and lightheadedness/fatigue, concerning for GI bleed. PMH is significant for CKD IV, bilateral carotid artery stenosis L>R, CVA in 11/2016, GERD, HLD,  iron deficiency, vitamin D deficiency, underweight, SAH, paroxysmal a-fib, multiple melanoma's of the scalp (March and April 2019), h/o urinary outlet obstruction s/p transurethral vaporization of the prostate in 2012.    Subacute LGIB, found to have colonic mass believed to be source of bleeding:  2.5 cm malignant appearing tumor found within distal transverse colon on colonoscopy 4/24.  CT chest/abdomen without evidence of metastasis.  Bleeding appears to have ceased. AM CBC is 9.4. CEA WNL at 1.7.  - GI following, recs for surgical eval - Gen Surg consulted, recommending f/u of path results - F/u surgical pathology of biopsies  - Patient will likely need oncology follow-up (already follows with Dr. Alen Blew for melanoma)  - monitor Hgb with CBC  Worsening Fatigue  Symptomatic Anemia: Improving. In the setting of GI bleed as discussed above.  Hemoglobin stable at 9.4.  - Monitor CBC - Continue ferrous sulfate 325 mg  - Follow-up folate level - home pepcid BID  - Avoid NSAIDs  AKI on CKD IV: Worsening. Cr 3.34 today, 2.5 on admit. Persistently elevated during this admission.  -continue mIVFs LR 100 mL/hour  - Encourage continued p.o. intake - Avoid nephrotoxic medications as possible - strict I/Os  - U/A   Stage IIC scalp melanoma: Stable. S/p excision and sentinel lymph node biopsy with left neck  dissection in 04/2017 with poor wound healing. - Recommend continue follow-up with Dr. Alen Blew and Dr. Ronnald Ramp (dermatology) outpatient  H/o CVA  Bilateral Carotid Stenosis ?h/o SAH  Paroxysmal A-fib: Chronic, stable. Regular rhythm currently.  -Continue to hold Eliquis in setting of GI bleed -Continue home atorvastatin 40 mg  Hyperlipidemia: Chronic, stable. -Continue atorvastatin 40 mg daily  FEN/GI:  Heart healthy diet   Prophylaxis: - SCDs  Disposition: anticipate discharge when hemoglobin stabilized  Subjective:  Patient reports no issues with SOB this AM. Had some chest discomfort after procedure that was attributed to gas.   Objective: Temp:  [97.7 F (36.5 C)-98 F (36.7 C)] 97.7 F (36.5 C) (04/25 2154) Pulse Rate:  [57-63] 57 (04/25 2154) Resp:  [16-18] 16 (04/25 2154) BP: (104-105)/(52-65) 104/52 (04/25 2154) SpO2:  [97 %-99 %] 99 % (04/25 2154) Weight:  [51.7 kg] 51.7 kg (04/26 0500)  Physical Exam: General: Cachectic elderly male sitting up in bed in NAD HEENT: Bandage on scalp melanoma, conjunctival pallor Cardiac: Regular rate and rhythm, bilateral radial pulses palpable Lungs: Clear to auscultation without wheezing, stable on room air, no crackles Abdomen: Nontender to palpation, abdomen soft bowel sounds present Msk: Thin extremities without lower extremity edema  Laboratory: Recent Labs  Lab 05/10/19 2237 05/11/19 0502 05/12/19 0838  WBC 9.6 10.2 6.1  HGB 8.9* 9.4* 9.4*  HCT 28.9* 31.0* 31.3*  PLT 175 182 175   Recent Labs  Lab 05/08/19 1858 05/09/19 0435 05/10/19 0708 05/11/19 0502 05/12/19 0618  NA 136   < > 145 141 140  K 3.9   < > 4.7 4.1  4.5  CL 104   < > 116* 113* 114*  CO2 21*   < > 18* 20* 17*  BUN 33*   < > 29* 29* 32*  CREATININE 2.53*   < > 3.08* 3.31* 3.34*  CALCIUM 8.6*   < > 9.1 8.7* 8.3*  PROT 5.9*  --   --   --   --   BILITOT 0.2*  --   --   --   --   ALKPHOS 64  --   --   --   --   ALT 9  --   --   --   --   AST  17  --   --   --   --   GLUCOSE 155*   < > 116* 96 78   < > = values in this interval not displayed.    Imaging/Diagnostic Tests: No results found.  Stark Klein, MD 05/12/2019, 1:50 PM PGY-2, [facility] Intern pager: [phone], text pages welcome

## 2019-05-13 ENCOUNTER — Other Ambulatory Visit: Payer: Self-pay | Admitting: Physician Assistant

## 2019-05-13 LAB — SURGICAL PATHOLOGY

## 2019-05-15 ENCOUNTER — Other Ambulatory Visit: Payer: Self-pay

## 2019-05-15 ENCOUNTER — Ambulatory Visit (INDEPENDENT_AMBULATORY_CARE_PROVIDER_SITE_OTHER): Payer: Medicare Other | Admitting: Family Medicine

## 2019-05-15 ENCOUNTER — Encounter: Payer: Self-pay | Admitting: Family Medicine

## 2019-05-15 VITALS — BP 116/58 | HR 57 | Wt 112.0 lb

## 2019-05-15 DIAGNOSIS — K6389 Other specified diseases of intestine: Secondary | ICD-10-CM

## 2019-05-15 DIAGNOSIS — N184 Chronic kidney disease, stage 4 (severe): Secondary | ICD-10-CM | POA: Diagnosis not present

## 2019-05-15 DIAGNOSIS — D631 Anemia in chronic kidney disease: Secondary | ICD-10-CM | POA: Diagnosis not present

## 2019-05-15 NOTE — Patient Instructions (Signed)
Thank you for coming to see me today. It was a pleasure! Today we talked about:   I will call you about restarting eliquis.    Please follow-up 1-2 weeks after your surgery or sooner as needed.  If you have any questions or concerns, please do not hesitate to call the office at [phone].  Take Care,   [place] Faye Strohman, DO

## 2019-05-15 NOTE — Progress Notes (Signed)
   SUBJECTIVE:   CHIEF COMPLAINT / HPI:  [id number]. Keith [name].  Hospital follow-up: Patient reports feeling much better after his recent hospitalization.  He denies any trouble with chest pain, shortness of breath, fatigue.  He denies any further episodes of rectal bleeding.  They were told that he had a mass while in the hospital but there was no pathology report available at the time.  Patient has follow-up with surgery, GI and oncology in the future.  Patient is unsure if they will schedule surgery at that time.  He has an appointment with a gastroenterologist on 5/6.  PERTINENT  PMH / PSH: h/o CVA, bilateral carotid artery stenosis, paroxysmal atrial fibrillation, h/o melanoma of the scalp,h/o subarachnoid hemorrhage, CKD stage IV, anemia of chronic disease  OBJECTIVE:  BP (!) 116/58   Pulse (!) 57   Wt 112 lb (50.8 kg)   SpO2 100%   BMI 16.54 kg/m   General: NAD, pleasant, walking unassisted in the office Neck: Supple Cardiovascular: RRR, no m/r/g, no LE edema Respiratory: CTA BL, normal work of breathing Psych: AOx3, appropriate affect  ASSESSMENT/PLAN:   Mass of colon Patient currently on Eliquis for paroxysmal A. Fib and has history of CVA.  He is not on aspirin for his bilateral carotid stenosis given that he is on Eliquis.  Discussed risk versus benefits of holding versus restarting this medication. Given that we do not know when patient will have surgery and he has follow-up on 5/6 believe that it may be best if patient continues to hold his Eliquis as they may resect the mass and would need to hold Eliquis prior to surgery.  Patient in agreement with the plan.  Anemia in stage 4 chronic kidney disease (HCC) CBC improved after recent hospitalization  CKD (chronic kidney disease) stage 4, GFR 15-29 ml/min (HCC) BMP with improved renal function after discharge.   [place] Shakyla Nolley, DO PGY-3, [facility]

## 2019-05-16 LAB — BASIC METABOLIC PANEL
BUN/Creatinine Ratio: 7 — ABNORMAL LOW (ref 10–24)
BUN: 22 mg/dL (ref 8–27)
CO2: 22 mmol/L (ref 20–29)
Calcium: 9.2 mg/dL (ref 8.6–10.2)
Chloride: 108 mmol/L — ABNORMAL HIGH (ref 96–106)
Creatinine, Ser: 2.97 mg/dL — ABNORMAL HIGH (ref 0.76–1.27)
GFR calc Af Amer: 22 mL/min/{1.73_m2} — ABNORMAL LOW (ref >59)
GFR calc non Af Amer: 19 mL/min/{1.73_m2} — ABNORMAL LOW (ref >59)
Glucose: 93 mg/dL (ref 65–99)
Potassium: 4.5 mmol/L (ref 3.5–5.2)
Sodium: 143 mmol/L (ref 134–144)

## 2019-05-16 LAB — CBC
Hematocrit: 30.9 % — ABNORMAL LOW (ref 37.5–51.0)
Hemoglobin: 10.2 g/dL — ABNORMAL LOW (ref 13.0–17.7)
MCH: 29.2 pg (ref 26.6–33.0)
MCHC: 33 g/dL (ref 31.5–35.7)
MCV: 89 fL (ref 79–97)
Platelets: 277 10*3/uL (ref 150–450)
RBC: 3.49 x10E6/uL — ABNORMAL LOW (ref 4.14–5.80)
RDW: 12.9 % (ref 11.6–15.4)
WBC: 5.8 10*3/uL (ref 3.4–10.8)

## 2019-05-17 NOTE — Progress Notes (Signed)
I have discussed biopsy results in detail with patient's son and his fiance Mardene Celeste. I also called [facility] and talked to the nurse. She was kind enough to make appointment with Dr. Leighton Ruff in 2 weeks. Both the sites (transverse colon, sigmoid polyp) have been tattooed. Will await for surgery recommendations.  RG

## 2019-05-19 NOTE — Assessment & Plan Note (Signed)
BMP with improved renal function after discharge.

## 2019-05-19 NOTE — Assessment & Plan Note (Signed)
>>  ASSESSMENT AND PLAN FOR MASS OF COLON WRITTEN ON 05/19/2019  7:27 PM BY SHIRLEY, JORDAN, DO  Patient currently on Eliquis  for paroxysmal A. Fib and has history of CVA.  He is not on aspirin  for his bilateral carotid stenosis given that he is on Eliquis .  Discussed risk versus benefits of holding versus restarting this medication. Given that we do not know when patient will have surgery and he has follow-up on 5/6 believe that it may be best if patient continues to hold his Eliquis  as they may resect the mass and would need to hold Eliquis  prior to surgery.  Patient in agreement with the plan.

## 2019-05-19 NOTE — Assessment & Plan Note (Signed)
CBC improved after recent hospitalization

## 2019-05-19 NOTE — Assessment & Plan Note (Addendum)
Patient currently on Eliquis for paroxysmal A. Fib and has history of CVA.  He is not on aspirin for his bilateral carotid stenosis given that he is on Eliquis.  Discussed risk versus benefits of holding versus restarting this medication. Given that we do not know when patient will have surgery and he has follow-up on 5/6 believe that it may be best if patient continues to hold his Eliquis as they may resect the mass and would need to hold Eliquis prior to surgery.  Patient in agreement with the plan.

## 2019-05-21 DIAGNOSIS — D631 Anemia in chronic kidney disease: Secondary | ICD-10-CM | POA: Diagnosis not present

## 2019-05-21 DIAGNOSIS — N184 Chronic kidney disease, stage 4 (severe): Secondary | ICD-10-CM | POA: Diagnosis not present

## 2019-05-21 DIAGNOSIS — Z8673 Personal history of transient ischemic attack (TIA), and cerebral infarction without residual deficits: Secondary | ICD-10-CM | POA: Diagnosis not present

## 2019-05-22 ENCOUNTER — Encounter: Payer: Self-pay | Admitting: Gastroenterology

## 2019-05-22 ENCOUNTER — Ambulatory Visit (INDEPENDENT_AMBULATORY_CARE_PROVIDER_SITE_OTHER): Payer: Medicare Other | Admitting: Gastroenterology

## 2019-05-22 VITALS — BP 114/50 | HR 58 | Temp 97.5°F | Ht 69.0 in | Wt 112.5 lb

## 2019-05-22 DIAGNOSIS — D649 Anemia, unspecified: Secondary | ICD-10-CM | POA: Diagnosis not present

## 2019-05-22 DIAGNOSIS — C189 Malignant neoplasm of colon, unspecified: Secondary | ICD-10-CM

## 2019-05-22 DIAGNOSIS — C184 Malignant neoplasm of transverse colon: Secondary | ICD-10-CM | POA: Diagnosis present

## 2019-05-22 DIAGNOSIS — C801 Malignant (primary) neoplasm, unspecified: Secondary | ICD-10-CM | POA: Insufficient documentation

## 2019-05-22 LAB — CUP PACEART REMOTE DEVICE CHECK
Date Time Interrogation Session: 20210505045815
Implantable Pulse Generator Implant Date: 20181108

## 2019-05-22 NOTE — Progress Notes (Signed)
05/22/2019 Keith [name] [id number] [birth year]   HISTORY OF PRESENT ILLNESS:  This is a 79 year old male recently admitted to [facility] for symptomatic anemia, heme positive stool.  Underwent EGD and colonoscopy and was found to have adenocarcinoma of the distal transverse colon at 2 different sites, distal transverse colon as well as an area of the sigmoid colon.  He feels well.  Hemoglobin was stable at the time of discharge and has had no further rectal bleeding.  Appetite is good.  He is moving his bowels well.  He is seeing [facility] in follow-up next week.  He has remained off of Eliquis.  Past Medical History:  Diagnosis Date  . Cataract    planning surgery  . Chronic kidney disease    "weak Kidneys"  . Complication of anesthesia    patient expressed that he has difficulty post anesthesia with swallowing, chewing, and talking after surgery  . GERD (gastroesophageal reflux disease)    tums prn  . Glaucoma   . History of hiatal hernia   . Hypothyroidism   . Hypothyroidism, postsurgical 11/20/2016  . 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 Melanoma ([place])    scalp  . Melanoma of scalp ([place]) 04/23/2017  . Obstructive uropathy   . PONV (postoperative nausea and vomiting) 03/20/2017  . Stroke ([place]) 11/2016   speech and right side at time of stroke 04/20/17- all function return to normal  . Underweight 01/24/2018   Past Surgical History:  Procedure Laterality Date  . APPLICATION OF A-CELL OF HEAD/NECK N/A 04/23/2017   Procedure: APPLICATION OF INTREGRA;  Surgeon: Irene Limbo, MD;  Location: [place];  Service: Plastics;  Laterality: N/A;  . BIOPSY  05/10/2019   Procedure: BIOPSY;  Surgeon: [name], MD;  Location: [facility] ENDOSCOPY;  Service: Endoscopy;;  . CATARACT EXTRACTION    . COLONOSCOPY WITH PROPOFOL N/A 05/10/2019   Procedure: COLONOSCOPY WITH PROPOFOL;  Surgeon: [name], MD;  Location: [facility] ENDOSCOPY;  Service: Endoscopy;  Laterality: N/A;  .  ESOPHAGOGASTRODUODENOSCOPY (EGD) WITH PROPOFOL N/A 05/10/2019   Procedure: ESOPHAGOGASTRODUODENOSCOPY (EGD) WITH PROPOFOL;  Surgeon: [name], MD;  Location: [facility] ENDOSCOPY;  Service: Endoscopy;  Laterality: N/A;  . EXCISION MASS HEAD N/A 03/20/2017   Procedure: EXCISION scalp lesion;  Surgeon: Irene Limbo, MD;  Location: [place];  Service: Plastics;  Laterality: N/A;  EXCISION scalp lesion  . EYE SURGERY    . IR ANGIO INTRA EXTRACRAN SEL COM CAROTID INNOMINATE BILAT MOD SED  11/23/2016  . IR ANGIO VERTEBRAL SEL VERTEBRAL UNI L MOD SED  11/23/2016  . LOOP RECORDER INSERTION N/A 11/23/2016   Procedure: LOOP RECORDER INSERTION;  Surgeon: [name], MD;  Location: [place] CV LAB;  Service: Cardiovascular;  Laterality: N/A;  . MASS EXCISION N/A 04/23/2017   Procedure: EXCISION MALIGNANT LESION OF SCALP;  Surgeon: Irene Limbo, MD;  Location: [place];  Service: Plastics;  Laterality: N/A;  . MELANOMA EXCISION  04/23/2017   BILATERAL SUPERFICIAL PAROTIDECTOMY/NECK SENTINEL NODE BIOPSY WITH FROZEN SECTION; LEFT SELECTIVE NECK DISSECTION  . PAROTIDECTOMY Bilateral 04/23/2017   Procedure: BILATERAL SUPERFICIAL PAROTIDECTOMY/NECK SENTINEL NODE BIOPSY WITH FROZEN SECTION; LEFT SELECTIVE NECK DISSECTION;  Surgeon: [name], MD;  Location: [place];  Service: ENT;  Laterality: Bilateral;  . POLYPECTOMY  05/10/2019   Procedure: POLYPECTOMY;  Surgeon: [name], MD;  Location: [place];  Service: Endoscopy;;  . SKIN GRAFT SPLIT THICKNESS HEAD / NECK  06/04/2017   SKIN GRAFT SPLIT THICKNESS TO SCALP  FROM RIGHT OR LEFT THIGH POSSIBLE A CELL TO DONOR SITE  . SKIN SPLIT GRAFT N/A 06/04/2017   Procedure: SKIN GRAFT SPLIT THICKNESS TO SCALP FROM RIGHT OR LEFT THIGH POSSIBLE A CELL TO DONOR SITE;  Surgeon: Irene Limbo, MD;  Location: [place];  Service: Plastics;  Laterality: N/A;  . SUBMUCOSAL TATTOO INJECTION  05/10/2019   Procedure: SUBMUCOSAL TATTOO INJECTION;  Surgeon: [name], MD;   Location: [facility] ENDOSCOPY;  Service: Endoscopy;;  . THYROIDECTOMY    . TONSILLECTOMY      reports that he quit smoking about 38 years ago. His smoking use included cigarettes. He has a 64.00 pack-year smoking history. He has never used smokeless tobacco. He reports that he does not drink alcohol or use drugs. family history includes Diabetes in his brother; Lung cancer in his father and mother. Allergies  Allergen Reactions  . Penicillins Other (See Comments)    UNKNOWN REACTION FROM CHILDHOOD  Has patient had a PCN reaction causing immediate rash, facial/tongue/throat swelling, SOB or lightheadedness with hypotension: Unknown Has patient had a PCN reaction causing severe rash involving mucus membranes or skin necrosis: Unknown Has patient had a PCN reaction that required hospitalization: Unknown Has patient had a PCN reaction occurring within the last 10 years: No If all of the above answers are "NO", then may proceed with Cephalosporin use.       Outpatient Encounter Medications as of 05/22/2019  Medication Sig  . atorvastatin (LIPITOR) 40 MG tablet TAKE 1 TABLET (40 MG TOTAL) BY MOUTH DAILY AT 6 PM.  . cholecalciferol (VITAMIN D3) 25 MCG (1000 UNIT) tablet Take 1,000 Units by mouth daily.  . Dapsone 5 % topical gel Apply 1 application topically 2 (two) times daily.   . famotidine (PEPCID) 20 MG tablet TAKE 1 TABLET BY MOUTH TWICE A DAY  . ferrous sulfate 325 (65 FE) MG tablet Take 325 mg by mouth daily.   . Multiple Vitamin (MULTIVITAMIN) capsule Take 1 capsule by mouth daily.   . mupirocin ointment (BACTROBAN) 2 % Place 1 application into the nose daily.  . timolol (TIMOPTIC) 0.5 % ophthalmic solution Place 1 drop into both eyes 2 (two) times daily.   . [DISCONTINUED] ergocalciferol (VITAMIN D2) 1.25 MG (50000 UT) capsule Take by mouth.   No facility-administered encounter medications on file as of 05/22/2019.     REVIEW OF SYSTEMS  : All other systems reviewed and negative except  where noted in the History of Present Illness.   PHYSICAL EXAM: BP (!) 114/50 (BP Location: Left Arm, Patient Position: Sitting, Cuff Size: Normal)   Pulse (!) 58   Temp (!) 97.5 F (36.4 C)   Ht 5\' 9"  (1.753 m)   Wt 112 lb 8 oz (51 kg)   SpO2 100%   BMI 16.61 kg/m  General: Well developed white male in no acute distress Head: Normocephalic and atraumatic Eyes:  Sclerae anicteric, conjunctiva pink. Ears: Normal auditory acuity Lungs: Clear throughout to auscultation; no increased WOB. Heart: Regular rate and rhythm; no M/R/G. Abdomen: Soft, non-distended.  BS present.  Non-tender. Musculoskeletal: Symmetrical with no gross deformities  Skin: No lesions on visible extremities Extremities: No edema  Neurological: Alert oriented x 4, grossly non-focal Psychological:  Alert and cooperative. Normal mood and affect  ASSESSMENT AND PLAN: *79 year old male recently admitted to [facility] for symptomatic anemia, heme positive stool.  Underwent EGD and colonoscopy and was found to have adenocarcinoma of the distal transverse colon at 2 different sites, distal transverse  colon as well as an area of the sigmoid colon.  He feels well.  Hemoglobin was stable at the time of discharge and has had no further rectal bleeding.  He has remained off of Eliquis.  Appetite is good.  He is moving his bowels well.  He is seeing [facility] in follow-up next week.  It does not appear that there has been in communication with his oncologist so I told him that I would message Dr. Alen Blew to keep him in the loop as well.   CC:  Bonnita Hollow, MD

## 2019-05-22 NOTE — Patient Instructions (Signed)
If you are age 78 or older, your body mass index should be between 23-30. Your Body mass index is 16.61 kg/m. If this is out of the aforementioned range listed, please consider follow up with your Primary Care Provider.  If you are age 56 or younger, your body mass index should be between 19-25. Your Body mass index is 16.61 kg/m. If this is out of the aformentioned range listed, please consider follow up with your Primary Care Provider.   Follow up with surgery.  Follow up with [place] as needed.  Thank you for entrusting me with your care and for choosing [employer], Alonza Bogus, P.A. - C.

## 2019-05-23 NOTE — Progress Notes (Signed)
Agree with excellent note It to be great to have him follow-up with oncologist Dr. Hartley Barefoot

## 2019-05-26 ENCOUNTER — Ambulatory Visit (INDEPENDENT_AMBULATORY_CARE_PROVIDER_SITE_OTHER): Payer: Medicare Other | Admitting: *Deleted

## 2019-05-26 DIAGNOSIS — I63232 Cerebral infarction due to unspecified occlusion or stenosis of left carotid arteries: Secondary | ICD-10-CM

## 2019-05-27 NOTE — Progress Notes (Signed)
Carelink Summary Report / Loop Recorder 

## 2019-05-30 ENCOUNTER — Other Ambulatory Visit: Payer: Self-pay | Admitting: General Surgery

## 2019-05-30 ENCOUNTER — Ambulatory Visit: Payer: Self-pay | Admitting: General Surgery

## 2019-05-30 DIAGNOSIS — C184 Malignant neoplasm of transverse colon: Secondary | ICD-10-CM | POA: Diagnosis not present

## 2019-05-30 DIAGNOSIS — C187 Malignant neoplasm of sigmoid colon: Secondary | ICD-10-CM | POA: Diagnosis not present

## 2019-05-30 MED ORDER — GENTAMICIN SULFATE 40 MG/ML IJ SOLN: 5.0000 mg/kg | INTRAMUSCULAR | Status: DC

## 2019-05-30 MED ORDER — DEXTROSE 5 % IV SOLN
900.0000 mg | INTRAVENOUS | Status: DC
Start: 2019-05-30 — End: 2020-08-11

## 2019-05-30 NOTE — Progress Notes (Unsigned)
The patient is a 79 year old male who presents with colorectal cancer. 79 year old male who presents to the office today for evaluation of newly diagnosed synchronous colon cancers. He was recently hospitalized due to severe anemia. He was transfused and noted to have iron deficiency anemia. Colonoscopy and EGD were performed. Colonoscopy showed a distal transverse colon mass which was biopsied and sigmoid colon polyp which was removed. Both lesions were tattooed. Both lesions were noted to have adenocarcinoma on final pathology. CT scans of the chest, abdomen and pelvis showed no signs of metastatic disease. CEA level was normal. Patient has no past surgical history. He has a history of a CVA in 2017 with no residual weakness. He has chronic kidney disease with a baseline creatinine around 2. Patient has a history of atrial fibrillation, but is currently in sinus rhythm according to him. He takes Elliquis for his stroke, but this is currently being held due to his GI bleeding.   Problem List/Past Medical Mammie Lorenzo, LPN; 04/29/2438 1:02 PM) MALIGNANT NEOPLASM OF TRANSVERSE COLON (C18.4) MALIGNANT NEOPLASM OF SIGMOID COLON (C18.7)  Past Surgical History Mammie Lorenzo, LPN; 08/10/3662 4:03 PM) Cataract Surgery Bilateral. Colon Polyp Removal - Colonoscopy Sentinel Lymph Node Biopsy  Diagnostic Studies History Mammie Lorenzo, LPN; 4/74/2595 6:38 PM) Colonoscopy within last year  Allergies (April Staton, CMA; 05/30/2019 2:25 PM) Penicillins  Medication History Leighton Ruff, MD; 7/56/4332 2:59 PM) Atorvastatin Calcium (40MG  Tablet, Oral) Active. Famotidine (20MG  Tablet, Oral) Active. Timolol Maleate (0.25% Solution, Ophthalmic) Active. Vitamin D (Cholecalciferol) (Oral) Specific strength unknown - Active. Iron (325 (65 Fe)MG Tablet, Oral) Active. Multi-Vitamin (Oral) Active. Aczone (7.5% Gel, External) Active. Medications Reconciled Neomycin Sulfate (500MG   Tablet, 2 (two) Oral SEE NOTE, Taken starting 05/30/2019) Active. (TAKE TWO TABLETS AT 2 PM, 3 PM, AND 10 PM THE DAY PRIOR TO SURGERY) Flagyl (500MG  Tablet, 2 (two) Oral SEE NOTE, Taken starting 05/30/2019) Active. (Take at 2pm, 3pm, and 10pm the day prior to your colon operation)  Social History Mammie Lorenzo, LPN; 9/51/8841 6:60 PM) Alcohol use Occasional alcohol use. Caffeine use Carbonated beverages, Coffee, Tea. No drug use Tobacco use Former smoker.  Family History Mammie Lorenzo, LPN; 07/16/1599 0:93 PM) Diabetes Mellitus Mother. Heart Disease Father. Heart disease in male family member before age 43 Respiratory Condition Father, Mother.  Other Problems Mammie Lorenzo, LPN; 2/35/5732 2:02 PM) Atrial Fibrillation Cerebrovascular Accident Chronic Renal Failure Syndrome Gastroesophageal Reflux Disease Melanoma     Review of Systems Mammie Lorenzo LPN; 5/42/7062 3:76 PM) General Not Present- Appetite Loss, Chills, Fatigue, Fever, Night Sweats, Weight Gain and Weight Loss. Skin Present- Non-Healing Wounds. Not Present- Change in Wart/Mole, Dryness, Hives, Jaundice, New Lesions, Rash and Ulcer. HEENT Present- Hearing Loss. Not Present- Earache, Hoarseness, Nose Bleed, Oral Ulcers, Ringing in the Ears, Seasonal Allergies, Sinus Pain, Sore Throat, Visual Disturbances, Wears glasses/contact lenses and Yellow Eyes. Respiratory Not Present- Bloody sputum, Chronic Cough, Difficulty Breathing, Snoring and Wheezing. Breast Not Present- Breast Mass, Breast Pain, Nipple Discharge and Skin Changes. Cardiovascular Not Present- Chest Pain, Difficulty Breathing Lying Down, Leg Cramps, Palpitations, Rapid Heart Rate, Shortness of Breath and Swelling of Extremities. Gastrointestinal Not Present- Abdominal Pain, Bloating, Bloody Stool, Change in Bowel Habits, Chronic diarrhea, Constipation, Difficulty Swallowing, Excessive gas, Gets full quickly at meals, Hemorrhoids, Indigestion,  Nausea, Rectal Pain and Vomiting. Male Genitourinary Not Present- Blood in Urine, Change in Urinary Stream, Frequency, Impotence, Nocturia, Painful Urination, Urgency and Urine Leakage. Musculoskeletal Not Present- Back Pain, Joint Pain, Joint Stiffness, Muscle Pain, Muscle  Weakness and Swelling of Extremities. Neurological Not Present- Decreased Memory, Fainting, Headaches, Numbness, Seizures, Tingling, Tremor, Trouble walking and Weakness. Psychiatric Not Present- Anxiety, Bipolar, Change in Sleep Pattern, Depression, Fearful and Frequent crying. Endocrine Not Present- Cold Intolerance, Excessive Hunger, Hair Changes, Heat Intolerance, Hot flashes and New Diabetes. Hematology Present- Easy Bruising. Not Present- Blood Thinners, Excessive bleeding, Gland problems, HIV and Persistent Infections.  Vitals (April Staton CMA; 05/30/2019 2:27 PM) 05/30/2019 2:26 PM Weight: 114.38 lb Height: 69in Body Surface Area: 1.63 m Body Mass Index: 16.89 kg/m  Temp.: 97.12F(Tympanic)  Pulse: 59 (Regular)  P.OX: 98% (Room air) BP: 130/50(Sitting, Left Arm, Standard)        Physical Exam Leighton Ruff MD; 5/39/7673 2:56 PM)  General Mental Status-Alert. General Appearance-Cooperative. CV: RRR Lungs:CTA Abdomen Palpation/Percussion Palpation and Percussion of the abdomen reveal - Soft and Non Tender.    Assessment & Plan Leighton Ruff MD; 05/04/3788 2:56 PM)  MALIGNANT NEOPLASM OF TRANSVERSE COLON (C18.4) Impression: 79 year old male with a history of CVA on Elliquis (held), who presents to the office today for evaluation of a newly diagnosed colon cancer. Patient was recently admitted due to anemia. He was transfused and a colonoscopy and EGD were performed. He was noted to have a ulcerative mass in the distal transverse colon, which was tattooed. There was also a sigmoid colon polyp which was resected and tattooed. Both of these biopsies came back as adenocarcinoma. I have  recommended that we proceed with robotic left hemicolectomy. We have discussed the small chance that his transverse colon would not reach down to his rectum and he would need a total colectomy with ileorectal anastomosis. We have discussed this in detail. We have discussed that this is a rather large surgery and we would like to get cardiology input on his ability to withstand the surgery. We will also send a copy of the bowel prep to his nephrologist to get any recommendations on how to protect his kidneys during his preparations for surgery. The surgery and anatomy were described to the patient as well as the risks of surgery and the possible complications. These include: Bleeding, deep abdominal infections and possible wound complications such as hernia and infection, damage to adjacent structures, leak of surgical connections, which can lead to other surgeries and possibly an ostomy, possible need for other procedures, such as abscess drains in radiology, possible prolonged hospital stay, possible diarrhea from removal of part of the colon, possible constipation from narcotics, possible bowel, bladder or sexual dysfunction if having rectal surgery, prolonged fatigue/weakness or appetite loss, possible early recurrence of of disease, possible complications of their medical problems such as heart disease or arrhythmias or lung problems, death (less than 1%). I believe the patient understands and wishes to proceed with the surgery.

## 2019-05-30 NOTE — H&P (Signed)
) The patient is a 79 year old male who presents with colorectal cancer. 79 year old male who presents to the office today for evaluation of newly diagnosed synchronous colon cancers.  He was recently hospitalized due to severe anemia.  He was transfused and noted to have iron deficiency anemia.  Colonoscopy and EGD were performed.  Colonoscopy showed a distal transverse colon mass which was biopsied and sigmoid colon polyp which was removed.  Both lesions were tattooed.  Both lesions were noted to have adenocarcinoma on final pathology.  CT scans of the chest, abdomen and pelvis showed no signs of metastatic disease.  CEA level was normal.  Patient has no past surgical history.  He has a history of a CVA in 2017 with no residual weakness.  He has chronic kidney disease with a baseline creatinine around 2.  Patient has a history of atrial fibrillation, but is currently in sinus rhythm according to him.  He takes Elliquis for his stroke, but this is currently being held due to his GI bleeding.   Problem List/Past Medical Mammie Lorenzo, LPN; 6/57/8469 6:29 PM) MALIGNANT NEOPLASM OF TRANSVERSE COLON (C18.4)   MALIGNANT NEOPLASM OF SIGMOID COLON (C18.7)    Past Surgical History Mammie Lorenzo, LPN; 06/13/4130 4:40 PM) Cataract Surgery   Bilateral. Colon Polyp Removal - Colonoscopy   Sentinel Lymph Node Biopsy    Diagnostic Studies History Mammie Lorenzo, LPN; 01/18/7251 6:64 PM) Colonoscopy   within last year  Allergies (April Staton, CMA; 05/30/2019 2:25 PM) Penicillins    Medication History Leighton Ruff, MD; 04/18/4740 2:59 PM) Atorvastatin Calcium  (40MG  Tablet, Oral) Active. Famotidine  (20MG  Tablet, Oral) Active. Timolol Maleate  (0.25% Solution, Ophthalmic) Active. Vitamin D (Cholecalciferol)  (Oral) Specific strength unknown - Active. Iron  (325 (65 Fe)MG Tablet, Oral) Active. Multi-Vitamin  (Oral) Active. Aczone  (7.5% Gel, External) Active. Medications Reconciled  Neomycin Sulfate   (500MG  Tablet, 2 (two) Oral SEE NOTE, Taken starting 05/30/2019) Active. (TAKE TWO TABLETS AT 2 PM, 3 PM, AND 10 PM THE DAY PRIOR TO SURGERY) Flagyl  (500MG  Tablet, 2 (two) Oral SEE NOTE, Taken starting 05/30/2019) Active. (Take at 2pm, 3pm, and 10pm the day prior to your colon operation)  Social History Mammie Lorenzo, LPN; 5/95/6387 5:64 PM) Alcohol use   Occasional alcohol use. Caffeine use   Carbonated beverages, Coffee, Tea. No drug use   Tobacco use   Former smoker.  Family History Mammie Lorenzo, LPN; 3/32/9518 8:41 PM) Diabetes Mellitus   Mother. Heart Disease   Father. Heart disease in male family member before age 36   Respiratory Condition   Father, Mother.  Other Problems Mammie Lorenzo, LPN; 6/60/6301 6:01 PM) Atrial Fibrillation   Cerebrovascular Accident   Chronic Renal Failure Syndrome   Gastroesophageal Reflux Disease   Melanoma      Review of Systems Mammie Lorenzo LPN; 0/93/2355 7:32 PM) General Not Present- Appetite Loss, Chills, Fatigue, Fever, Night Sweats, Weight Gain and Weight Loss. Skin Present- Non-Healing Wounds. Not Present- Change in Wart/Mole, Dryness, Hives, Jaundice, New Lesions, Rash and Ulcer. HEENT Present- Hearing Loss. Not Present- Earache, Hoarseness, Nose Bleed, Oral Ulcers, Ringing in the Ears, Seasonal Allergies, Sinus Pain, Sore Throat, Visual Disturbances, Wears glasses/contact lenses and Yellow Eyes. Respiratory Not Present- Bloody sputum, Chronic Cough, Difficulty Breathing, Snoring and Wheezing. Breast Not Present- Breast Mass, Breast Pain, Nipple Discharge and Skin Changes. Cardiovascular Not Present- Chest Pain, Difficulty Breathing Lying Down, Leg Cramps, Palpitations, Rapid Heart Rate, Shortness of Breath and Swelling of Extremities. Gastrointestinal  Not Present- Abdominal Pain, Bloating, Bloody Stool, Change in Bowel Habits, Chronic diarrhea, Constipation, Difficulty Swallowing, Excessive gas, Gets full quickly at meals, Hemorrhoids,  Indigestion, Nausea, Rectal Pain and Vomiting. Male Genitourinary Not Present- Blood in Urine, Change in Urinary Stream, Frequency, Impotence, Nocturia, Painful Urination, Urgency and Urine Leakage. Musculoskeletal Not Present- Back Pain, Joint Pain, Joint Stiffness, Muscle Pain, Muscle Weakness and Swelling of Extremities. Neurological Not Present- Decreased Memory, Fainting, Headaches, Numbness, Seizures, Tingling, Tremor, Trouble walking and Weakness. Psychiatric Not Present- Anxiety, Bipolar, Change in Sleep Pattern, Depression, Fearful and Frequent crying. Endocrine Not Present- Cold Intolerance, Excessive Hunger, Hair Changes, Heat Intolerance, Hot flashes and New Diabetes. Hematology Present- Easy Bruising. Not Present- Blood Thinners, Excessive bleeding, Gland problems, HIV and Persistent Infections.  Vitals (April Staton CMA; 05/30/2019 2:27 PM) 05/30/2019 2:26 PM Weight: 114.38 lb   Height: 69 in  Body Surface Area: 1.63 m   Body Mass Index: 16.89 kg/m   Temp.: 97.4 F  (Tympanic)    Pulse: 59 (Regular)    P.OX: 98% (Room air) BP: 130/50(Sitting, Left Arm, Standard)       Physical Exam Leighton Ruff MD; 7/37/1062 2:56 PM) General Mental Status - Alert. General Appearance - Cooperative.  Abdomen Palpation/Percussion Palpation and Percussion of the abdomen reveal - Soft and Non Tender.    Assessment & Plan Leighton Ruff MD; 6/94/8546 2:56 PM) MALIGNANT NEOPLASM OF TRANSVERSE COLON (C18.4) Impression: 79 year old male with a history of CVA on Elliquis (held), who presents to the office today for evaluation of a newly diagnosed colon cancer. Patient was recently admitted due to anemia. He was transfused and a colonoscopy and EGD were performed. He was noted to have a ulcerative mass in the distal transverse colon, which was tattooed. There was also a sigmoid colon polyp which was resected and tattooed. Both of these biopsies came back as adenocarcinoma. I have recommended  that we proceed with robotic left hemicolectomy. We have discussed the small chance that his transverse colon would not reach down to his rectum and he would need a total colectomy with ileorectal anastomosis. We have discussed this in detail. We have discussed that this is a rather large surgery and we would like to get cardiology input on his ability to withstand the surgery. We will also send a copy of the bowel prep to his nephrologist to get any recommendations on how to protect his kidneys during his preparations for surgery. The surgery and anatomy were described to the patient as well as the risks of surgery and the possible complications. These include: Bleeding, deep abdominal infections and possible wound complications such as hernia and infection, damage to adjacent structures, leak of surgical connections, which can lead to other surgeries and possibly an ostomy, possible need for other procedures, such as abscess drains in radiology, possible prolonged hospital stay, possible diarrhea from removal of part of the colon, possible constipation from narcotics, possible bowel, bladder or sexual dysfunction if having rectal surgery, prolonged fatigue/weakness or appetite loss, possible early recurrence of of disease, possible complications of their medical problems such as heart disease or arrhythmias or lung problems, death (less than 1%). I believe the patient understands and wishes to proceed with the surgery.

## 2019-06-02 ENCOUNTER — Telehealth: Payer: Self-pay | Admitting: *Deleted

## 2019-06-02 NOTE — Telephone Encounter (Signed)
   [facility] Pre-operative Risk Assessment    HEARTCARE STAFF: - Please ensure there is not already an duplicate clearance open for this procedure - Under Visit Info/Reason for Call, type in Other and utilize the format Clearance MM/DD/YY or Clearance TBD  Request for surgical clearance:  1. What type of surgery is being performed? ROBOTIC PARTIAL COLECTOMY   2. When is this surgery scheduled? TBD   3. What type of clearance is required (medical clearance vs. Pharmacy clearance to hold med vs. Both)? MEDICAL  4. Are there any medications that need to be held prior to surgery and how long? NONE LISTED    5. Practice name and name of physician performing surgery? [facility]; DR. Leighton Ruff   6. What is the office phone number? [phone]   7.   What is the office fax number? [place]: Mammie Lorenzo, LPN  8.   Anesthesia type (None, local, MAC, general) ? GENERAL   Julaine Hua 06/02/2019, 11:06 AM  _________________________________________________________________   (provider comments below)

## 2019-06-03 NOTE — Telephone Encounter (Signed)
   Primary Cardiologist: Dr. Rayann Heman Chart reviewed as part of pre-operative protocol coverage. Because of Keith [name]'s past medical history and time since last visit, he/she will require a follow-up visit in order to better assess preoperative cardiovascular risk.  Pre-op covering staff: - Please schedule appointment and call patient to inform them. - Please contact requesting surgeon's office via preferred method (i.e, phone, fax) to inform them of need for appointment prior to surgery.  If applicable, this message will also be routed to pharmacy pool and/or primary cardiologist for input on holding anticoagulant/antiplatelet agent as requested below so that this information is available at time of patient's appointment.   [place], [place]  06/03/2019, 2:02 PM

## 2019-06-04 DIAGNOSIS — Z85820 Personal history of malignant melanoma of skin: Secondary | ICD-10-CM | POA: Diagnosis not present

## 2019-06-04 DIAGNOSIS — D692 Other nonthrombocytopenic purpura: Secondary | ICD-10-CM | POA: Diagnosis not present

## 2019-06-04 DIAGNOSIS — L131 Subcorneal pustular dermatitis: Secondary | ICD-10-CM | POA: Diagnosis not present

## 2019-06-04 NOTE — Telephone Encounter (Signed)
Pt has been scheduled to see Oda Kilts, [facility] 06/06/19 for pre op clearance. I will forward notes to [facility] for upcoming appt. I will send FYI to the requesting office pt has appt with cardiologist 06/06/19 for pre op assessment. I will remove from the pre op call back pool.

## 2019-06-06 ENCOUNTER — Encounter: Payer: Self-pay | Admitting: Student

## 2019-06-06 ENCOUNTER — Ambulatory Visit (INDEPENDENT_AMBULATORY_CARE_PROVIDER_SITE_OTHER): Payer: Medicare Other | Admitting: Student

## 2019-06-06 ENCOUNTER — Other Ambulatory Visit: Payer: Self-pay

## 2019-06-06 VITALS — BP 112/60 | HR 59 | Ht 69.0 in | Wt 115.4 lb

## 2019-06-06 DIAGNOSIS — I480 Paroxysmal atrial fibrillation: Secondary | ICD-10-CM

## 2019-06-06 DIAGNOSIS — I63232 Cerebral infarction due to unspecified occlusion or stenosis of left carotid arteries: Secondary | ICD-10-CM

## 2019-06-06 DIAGNOSIS — N184 Chronic kidney disease, stage 4 (severe): Secondary | ICD-10-CM

## 2019-06-06 LAB — CUP PACEART INCLINIC DEVICE CHECK
Date Time Interrogation Session: 20210521115350
Implantable Pulse Generator Implant Date: 20181108

## 2019-06-06 NOTE — Progress Notes (Signed)
Electrophysiology Office Note Date: 06/06/2019  ID:  [name], DOB 05/22/1940, MRN [id number]  PCP: Bonnita Hollow, MD Primary Cardiologist: No primary care provider on file. Electrophysiologist: [name], MD   CC: ILR follow-up  Keith [name] is a 79 y.o. male seen today for Dr. Rayann Heman . he presents today for cardiac clearance electrophysiology followup.  Pt was recently admitted for anemia. He was transfused and colonoscopy and EGD performed. This revealed sigmoid colon polyp and ulcerative mass in the distal transverse colon, which both came back as adenocarcinoma. The recommendation is that he undergo a robotic left hemicolectomy. He is doing well today. He tells me surgery is an option, but not a sure thing. There will at least be a [place] more watchful waiting before they proceed. He denies chest pain, palpitations, dyspnea, PND, orthopnea, nausea, vomiting, dizziness, syncope, edema, weight gain, or early satiety.  Device History: Medtronic loop recorder implanted 11/2016 for Cryptogenic Stroke  Past Medical History:  Diagnosis Date  . Cataract    planning surgery  . Chronic kidney disease    "weak Kidneys"  . Complication of anesthesia    patient expressed that he has difficulty post anesthesia with swallowing, chewing, and talking after surgery  . GERD (gastroesophageal reflux disease)    tums prn  . Glaucoma   . History of hiatal hernia   . Hypothyroidism   . Hypothyroidism, postsurgical 11/20/2016  . 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 Melanoma ([place])    scalp  . Melanoma of scalp ([place]) 04/23/2017  . Obstructive uropathy   . PONV (postoperative nausea and vomiting) 03/20/2017  . Stroke ([place]) 11/2016   speech and right side at time of stroke 04/20/17- all function return to normal  . Underweight 01/24/2018   Past Surgical History:  Procedure Laterality Date  . APPLICATION OF A-CELL OF HEAD/NECK N/A 04/23/2017   Procedure: APPLICATION OF  INTREGRA;  Surgeon: Irene Limbo, MD;  Location: [place];  Service: Plastics;  Laterality: N/A;  . BIOPSY  05/10/2019   Procedure: BIOPSY;  Surgeon: [name], MD;  Location: [facility] ENDOSCOPY;  Service: Endoscopy;;  . CATARACT EXTRACTION    . COLONOSCOPY WITH PROPOFOL N/A 05/10/2019   Procedure: COLONOSCOPY WITH PROPOFOL;  Surgeon: [name], MD;  Location: [facility] ENDOSCOPY;  Service: Endoscopy;  Laterality: N/A;  . ESOPHAGOGASTRODUODENOSCOPY (EGD) WITH PROPOFOL N/A 05/10/2019   Procedure: ESOPHAGOGASTRODUODENOSCOPY (EGD) WITH PROPOFOL;  Surgeon: [name], MD;  Location: [facility] ENDOSCOPY;  Service: Endoscopy;  Laterality: N/A;  . EXCISION MASS HEAD N/A 03/20/2017   Procedure: EXCISION scalp lesion;  Surgeon: Irene Limbo, MD;  Location: [place];  Service: Plastics;  Laterality: N/A;  EXCISION scalp lesion  . EYE SURGERY    . IR ANGIO INTRA EXTRACRAN SEL COM CAROTID INNOMINATE BILAT MOD SED  11/23/2016  . IR ANGIO VERTEBRAL SEL VERTEBRAL UNI L MOD SED  11/23/2016  . LOOP RECORDER INSERTION N/A 11/23/2016   Procedure: LOOP RECORDER INSERTION;  Surgeon: [name], MD;  Location: [place] CV LAB;  Service: Cardiovascular;  Laterality: N/A;  . MASS EXCISION N/A 04/23/2017   Procedure: EXCISION MALIGNANT LESION OF SCALP;  Surgeon: Irene Limbo, MD;  Location: [place];  Service: Plastics;  Laterality: N/A;  . MELANOMA EXCISION  04/23/2017   BILATERAL SUPERFICIAL PAROTIDECTOMY/NECK SENTINEL NODE BIOPSY WITH FROZEN SECTION; LEFT SELECTIVE NECK DISSECTION  . PAROTIDECTOMY Bilateral 04/23/2017   Procedure: BILATERAL SUPERFICIAL PAROTIDECTOMY/NECK SENTINEL NODE BIOPSY WITH FROZEN SECTION; LEFT SELECTIVE NECK DISSECTION;  Surgeon: [name], MD;  Location: [place];  Service: ENT;  Laterality: Bilateral;  . POLYPECTOMY  05/10/2019   Procedure: POLYPECTOMY;  Surgeon: [name], MD;  Location: [place];  Service: Endoscopy;;  . SKIN GRAFT SPLIT THICKNESS HEAD / NECK  06/04/2017   SKIN GRAFT  SPLIT THICKNESS TO SCALP FROM RIGHT OR LEFT THIGH POSSIBLE A CELL TO DONOR SITE  . SKIN SPLIT GRAFT N/A 06/04/2017   Procedure: SKIN GRAFT SPLIT THICKNESS TO SCALP FROM RIGHT OR LEFT THIGH POSSIBLE A CELL TO DONOR SITE;  Surgeon: Irene Limbo, MD;  Location: [place];  Service: Plastics;  Laterality: N/A;  . SUBMUCOSAL TATTOO INJECTION  05/10/2019   Procedure: SUBMUCOSAL TATTOO INJECTION;  Surgeon: [name], MD;  Location: [facility] ENDOSCOPY;  Service: Endoscopy;;  . THYROIDECTOMY    . TONSILLECTOMY      Current Outpatient Medications  Medication Sig Dispense Refill  . atorvastatin (LIPITOR) 40 MG tablet TAKE 1 TABLET (40 MG TOTAL) BY MOUTH DAILY AT 6 PM. 90 tablet 1  . cholecalciferol (VITAMIN D3) 25 MCG (1000 UNIT) tablet Take 1,000 Units by mouth daily.    . Dapsone 5 % topical gel Apply 1 application topically 2 (two) times daily.     . famotidine (PEPCID) 20 MG tablet TAKE 1 TABLET BY MOUTH TWICE A DAY 180 tablet 1  . ferrous sulfate 325 (65 FE) MG tablet Take 325 mg by mouth daily.     . Multiple Vitamin (MULTIVITAMIN) capsule Take 1 capsule by mouth daily.     . timolol (TIMOPTIC) 0.5 % ophthalmic solution Place 1 drop into both eyes 2 (two) times daily.   3   No current facility-administered medications for this visit.   Facility-Administered Medications Ordered in Other Visits  Medication Dose Route Frequency Provider Last Rate Last Admin  . clindamycin (CLEOCIN) 900 mg in dextrose 5 % 50 mL IVPB  900 mg Intravenous 60 min Pre-Op Leighton Ruff, MD       And  . gentamicin (GARAMYCIN) 260 mg in dextrose 5 % 50 mL IVPB  5 mg/kg Intravenous 60 min Pre-Op Leighton Ruff, MD        Allergies:   Penicillins   Social History: Social History   Socioeconomic History  . Marital status: Widowed    Spouse name: Not on file  . Number of children: 1  . Years of education: 46  . Highest education level: 12th grade  Occupational History  . Occupation: laborer    Comment: retired    Tobacco Use  . Smoking status: Former Smoker    Packs/day: 2.00    Years: 32.00    Pack years: 64.00    Types: Cigarettes    Quit date: 1983    Years since quitting: 38.4  . Smokeless tobacco: Never Used  . Tobacco comment: No plans to start  Substance and Sexual Activity  . Alcohol use: No  . Drug use: No  . Sexual activity: Not Currently  Other Topics Concern  . Not on file  Social History Narrative   Lives alone in house. One level house. Has one step at back. Smoke alarms. Throw rugs have a back on them. Grab bar at tub.   Has one dog "[place]", walks her several times a day. Sees son every day.      Eats a good variety of foods, doesn't like chicken, eats vegetables and fruits. Drinks sodas mostly. Now drinking Ensure, water, juice.      Has made  an appt to get dentures fixed.   Wears seatbelt in car.      Social Determinants of Health   Financial Resource Strain:   . Difficulty of Paying Living Expenses:   Food Insecurity:   . Worried About Charity fundraiser in the Last Year:   . Arboriculturist in the Last Year:   Transportation Needs:   . Film/video editor (Medical):   Marland Kitchen Lack of Transportation (Non-Medical):   Physical Activity:   . Days of Exercise per Week:   . Minutes of Exercise per Session:   Stress:   . Feeling of Stress :   Social Connections:   . Frequency of Communication with Friends and Family:   . Frequency of Social Gatherings with Friends and Family:   . Attends Religious Services:   . Active Member of Clubs or Organizations:   . Attends Archivist Meetings:   Marland Kitchen Marital Status:   Intimate Partner Violence:   . Fear of Current or Ex-Partner:   . Emotionally Abused:   Marland Kitchen Physically Abused:   . Sexually Abused:     Family History: Family History  Problem Relation Age of Onset  . Lung cancer Father   . Lung cancer Mother   . Diabetes Brother   . Colon cancer Neg Hx   . Colon polyps Neg Hx   . Esophageal cancer Neg  Hx   . Stomach cancer Neg Hx   . Pancreatic cancer Neg Hx      Review of Systems: All other systems reviewed and are otherwise negative except as noted above.  Physical Exam: Vitals:   06/06/19 1105  BP: 112/60  Pulse: (!) 59  SpO2: 99%  Weight: 115 lb 6.4 oz (52.3 kg)  Height: 5\' 9"  (1.753 m)     GEN- The patient is well appearing, alert and oriented x 3 today.   HEENT: normocephalic, atraumatic; sclera clear, conjunctiva pink; hearing intact; oropharynx clear; neck supple  Lungs- Clear to ausculation bilaterally, normal work of breathing.  No wheezes, rales, rhonchi Heart- Regular rate and rhythm, no murmurs, rubs or gallops  GI- soft, non-tender, non-distended, bowel sounds present  Extremities- no clubbing, cyanosis, or edema  MS- no significant deformity or atrophy Skin- warm and dry, no rash or lesion; PPM pocket well healed Psych- euthymic mood, full affect Neuro- strength and sensation are intact  PPM Interrogation- reviewed in detail today,  See PACEART report  EKG:  EKG is not ordered today. The ekg ordered 05/10/2019 shows sinus tachycardia at 114 bpm  Recent Labs: 05/08/2019: ALT 9; TSH 3.525 05/10/2019: Magnesium 2.1 05/15/2019: BUN 22; Creatinine, Ser 2.97; Hemoglobin 10.2; Platelets 277; Potassium 4.5; Sodium 143   Wt Readings from Last 3 Encounters:  06/06/19 115 lb 6.4 oz (52.3 kg)  05/22/19 112 lb 8 oz (51 kg)  05/15/19 112 lb (50.8 kg)     Other studies Reviewed: Additional studies/ records that were reviewed today include: Echo 11/2016 shows LVEF 55-60%, Previous EP office notes, Previous remote checks, Most recent labwork.   Assessment and Plan:  1. Cryptogenic Stroke s/p Medtronic Loop recorder ->  PAF identified Normal device function See Pace Art report No changes today  2. PAF <0.1% burden. Continue eliquis 2.5 mg BID (weight, Creatinine) for CHA2DS2VASC of 4. This is currently on hold for GI bleeding. Post op, should resume if tolerated  once healed; especially if has additional AF in peri-operative period.   3. CKD IV Creatinine 2.8 -  3.0 recently, perceived baseline closer to 2.  4. Cardiac clearance for robotic partial colectomy for colon cancer Echo 11/2016 with LVEF 55-60% He has been seen by Dr. Leighton Ruff for [place] Neoplasm of transverse colon and sigmoid colon.  With history of stroke, intraperitoneal surgery, and severe kidney diseae, pt is at high risk (>11%) of perioperative cardiac complications including but not limited to myocardial infarction, pulmonary edema, ventricular fibrillation or primary cardiac arrest, and complete heart block by Revised Cardiac Index [place] Criteria).   Current medicines are reviewed at length with the patient today.   The patient does not have concerns regarding his medicines.  The following changes were made today:  none  Labs/ tests ordered today include:  Orders Placed This Encounter  Procedures  . CUP PACEART INCLINIC DEVICE CHECK   Disposition:   Follow up with AF clinic in 6 Months   Signed, Annamaria Helling  06/06/2019 11:58 AM  [facility] [address] [place] [address] [phone] (office) [phone] (fax)

## 2019-06-06 NOTE — Patient Instructions (Addendum)
Medication Instructions:  none *If you need a refill on your cardiac medications before your next appointment, please call your pharmacy*   Lab Work: none If you have labs (blood work) drawn today and your tests are completely normal, you will receive your results only by: Marland Kitchen MyChart Message (if you have MyChart) OR . A paper copy in the mail If you have any lab test that is abnormal or we need to change your treatment, we will call you to review the results.   Testing/Procedures: none   Follow-Up:  6 months [facility], you and your health needs are our priority.  As part of our continuing mission to provide you with exceptional heart care, we have created designated Provider Care Teams.  These Care Teams include your primary Cardiologist (physician) and Advanced Practice Providers (APPs -  Physician Assistants and Nurse Practitioners) who all work together to provide you with the care you need, when you need it.  We recommend signing up for the patient portal called "MyChart".  Sign up information is provided on this After Visit Summary.  MyChart is used to connect with patients for Virtual Visits (Telemedicine).  Patients are able to view lab/test results, encounter notes, upcoming appointments, etc.  Non-urgent messages can be sent to your provider as well.   To learn more about what you can do with MyChart, go to [email or web address].      Other Instructions

## 2019-06-19 ENCOUNTER — Ambulatory Visit (INDEPENDENT_AMBULATORY_CARE_PROVIDER_SITE_OTHER): Payer: Medicare Other

## 2019-06-19 ENCOUNTER — Other Ambulatory Visit: Payer: Self-pay

## 2019-06-19 DIAGNOSIS — Z0000 Encounter for general adult medical examination without abnormal findings: Secondary | ICD-10-CM

## 2019-06-19 NOTE — Progress Notes (Addendum)
Subjective:   Keith [name] is a 79 y.o. male who presents for Medicare Annual/Subsequent preventive examination.  The patient consented to a virtual visit.   Review of Systems: Defer to PCP  Cardiac Risk Factors include: advanced age (>56men, >10 women)  Objective:   Vitals: There were no vitals taken for this visit.  There is no height or weight on file to calculate BMI.  Advanced Directives 06/19/2019 06/19/2019 05/15/2019 03/26/2019 01/24/2018 01/23/2018 12/10/2017  Does Patient Have a Medical Advance Directive? Yes Yes Yes No No No No  Type of Industrial/product designer of [employer] Power of Attorney - - - -  Does patient want to make changes to medical advance directive? No - Guardian declined No - Patient declined - - - - -  Copy of [place] in Chart? Yes - validated most recent copy scanned in chart (See row information) No - copy requested No - copy requested - - - -  Would patient like information on creating a medical advance directive? - - - No - Patient declined Yes (MAU/Ambulatory/Procedural Areas - Information given) No - Patient declined No - Patient declined   Tobacco Social History   Tobacco Use  Smoking Status Former Smoker  . Packs/day: 2.00  . Years: 32.00  . Pack years: 64.00  . Types: Cigarettes  . Quit date: 43  . Years since quitting: 38.4  Smokeless Tobacco Never Used  Tobacco Comment   No plans to start     Counseling given: No plans to restart   Clinical Intake:  Pre-visit preparation completed: Yes  How often do you need to have someone help you when you read instructions, pamphlets, or other written materials from your doctor or pharmacy?: 2 - Rarely What is the last grade level you completed in school?: high school  Interpreter Needed?: No  Past Medical History:  Diagnosis Date  . Cataract    planning surgery  . Chronic kidney disease    "weak Kidneys"  . Complication of  anesthesia    patient expressed that he has difficulty post anesthesia with swallowing, chewing, and talking after surgery  . GERD (gastroesophageal reflux disease)    tums prn  . Glaucoma   . History of hiatal hernia   . Hypothyroidism   . Hypothyroidism, postsurgical 11/20/2016  . 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 Melanoma ([place])    scalp  . Melanoma of scalp ([place]) 04/23/2017  . Obstructive uropathy   . PONV (postoperative nausea and vomiting) 03/20/2017  . Stroke ([place]) 11/2016   speech and right side at time of stroke 04/20/17- all function return to normal  . Underweight 01/24/2018   Past Surgical History:  Procedure Laterality Date  . APPLICATION OF A-CELL OF HEAD/NECK N/A 04/23/2017   Procedure: APPLICATION OF INTREGRA;  Surgeon: Irene Limbo, MD;  Location: [place];  Service: Plastics;  Laterality: N/A;  . BIOPSY  05/10/2019   Procedure: BIOPSY;  Surgeon: [name], MD;  Location: [facility] ENDOSCOPY;  Service: Endoscopy;;  . CATARACT EXTRACTION    . COLONOSCOPY WITH PROPOFOL N/A 05/10/2019   Procedure: COLONOSCOPY WITH PROPOFOL;  Surgeon: [name], MD;  Location: [facility] ENDOSCOPY;  Service: Endoscopy;  Laterality: N/A;  . ESOPHAGOGASTRODUODENOSCOPY (EGD) WITH PROPOFOL N/A 05/10/2019   Procedure: ESOPHAGOGASTRODUODENOSCOPY (EGD) WITH PROPOFOL;  Surgeon: [name], MD;  Location: [facility] ENDOSCOPY;  Service: Endoscopy;  Laterality: N/A;  . EXCISION MASS HEAD N/A 03/20/2017   Procedure:  EXCISION scalp lesion;  Surgeon: Irene Limbo, MD;  Location: [place];  Service: Plastics;  Laterality: N/A;  EXCISION scalp lesion  . EYE SURGERY    . IR ANGIO INTRA EXTRACRAN SEL COM CAROTID INNOMINATE BILAT MOD SED  11/23/2016  . IR ANGIO VERTEBRAL SEL VERTEBRAL UNI L MOD SED  11/23/2016  . LOOP RECORDER INSERTION N/A 11/23/2016   Procedure: LOOP RECORDER INSERTION;  Surgeon: [name], MD;  Location: [place] CV LAB;  Service: Cardiovascular;  Laterality: N/A;  . MASS EXCISION  N/A 04/23/2017   Procedure: EXCISION MALIGNANT LESION OF SCALP;  Surgeon: Irene Limbo, MD;  Location: [place];  Service: Plastics;  Laterality: N/A;  . MELANOMA EXCISION  04/23/2017   BILATERAL SUPERFICIAL PAROTIDECTOMY/NECK SENTINEL NODE BIOPSY WITH FROZEN SECTION; LEFT SELECTIVE NECK DISSECTION  . PAROTIDECTOMY Bilateral 04/23/2017   Procedure: BILATERAL SUPERFICIAL PAROTIDECTOMY/NECK SENTINEL NODE BIOPSY WITH FROZEN SECTION; LEFT SELECTIVE NECK DISSECTION;  Surgeon: [name], MD;  Location: [place];  Service: ENT;  Laterality: Bilateral;  . POLYPECTOMY  05/10/2019   Procedure: POLYPECTOMY;  Surgeon: [name], MD;  Location: [place];  Service: Endoscopy;;  . SKIN GRAFT SPLIT THICKNESS HEAD / NECK  06/04/2017   SKIN GRAFT SPLIT THICKNESS TO SCALP FROM RIGHT OR LEFT THIGH POSSIBLE A CELL TO DONOR SITE  . SKIN SPLIT GRAFT N/A 06/04/2017   Procedure: SKIN GRAFT SPLIT THICKNESS TO SCALP FROM RIGHT OR LEFT THIGH POSSIBLE A CELL TO DONOR SITE;  Surgeon: Irene Limbo, MD;  Location: [place];  Service: Plastics;  Laterality: N/A;  . SUBMUCOSAL TATTOO INJECTION  05/10/2019   Procedure: SUBMUCOSAL TATTOO INJECTION;  Surgeon: [name], MD;  Location: [facility] ENDOSCOPY;  Service: Endoscopy;;  . THYROIDECTOMY    . TONSILLECTOMY     Family History  Problem Relation Age of Onset  . Lung cancer Father   . Lung cancer Mother   . Diabetes Brother   . Colon cancer Neg Hx   . Colon polyps Neg Hx   . Esophageal cancer Neg Hx   . Stomach cancer Neg Hx   . Pancreatic cancer Neg Hx    Social History   Socioeconomic History  . Marital status: Widowed    Spouse name: Not on file  . Number of children: 1  . Years of education: 55  . Highest education level: 12th grade  Occupational History  . Occupation: laborer    Comment: retired  Tobacco Use  . Smoking status: Former Smoker    Packs/day: 2.00    Years: 32.00    Pack years: 64.00    Types: Cigarettes    Quit date: 1983    Years  since quitting: 38.4  . Smokeless tobacco: Never Used  . Tobacco comment: No plans to start  Substance and Sexual Activity  . Alcohol use: No  . Drug use: No  . Sexual activity: Not Currently  Other Topics Concern  . Not on file  Social History Narrative   Patient lives alone in [place].    Patient has one son and a daughter in law, [name]. Patient is close with them.    Patient has a dog named "boo boo bear." Great joy for him.   Patient enjoys spending time with his family and walking his dog.          Social Determinants of Health   Financial Resource Strain: Low Risk   . Difficulty of Paying Living Expenses: Not hard at all  Food Insecurity: No Food Insecurity  .  Worried About Charity fundraiser in the Last Year: Never true  . Ran Out of Food in the Last Year: Never true  Transportation Needs: No Transportation Needs  . Lack of Transportation (Medical): No  . Lack of Transportation (Non-Medical): No  Physical Activity: Sufficiently Active  . Days of Exercise per Week: 7 days  . Minutes of Exercise per Session: 60 min  Stress: No Stress Concern Present  . Feeling of Stress : Only a Go  Social Connections: Somewhat Isolated  . Frequency of Communication with Friends and Family: More than three times a week  . Frequency of Social Gatherings with Friends and Family: More than three times a week  . Attends Religious Services: More than 4 times per year  . Active Member of Clubs or Organizations: No  . Attends Archivist Meetings: Never  . Marital Status: Widowed   Outpatient Encounter Medications as of 06/19/2019  Medication Sig  . atorvastatin (LIPITOR) 40 MG tablet TAKE 1 TABLET (40 MG TOTAL) BY MOUTH DAILY AT 6 PM.  . cholecalciferol (VITAMIN D3) 25 MCG (1000 UNIT) tablet Take 1,000 Units by mouth daily.  . Dapsone 5 % topical gel Apply 1 application topically 2 (two) times daily.   . famotidine (PEPCID) 20 MG tablet TAKE 1 TABLET BY MOUTH TWICE A DAY  .  ferrous sulfate 325 (65 FE) MG tablet Take 325 mg by mouth daily.   . Multiple Vitamin (MULTIVITAMIN) capsule Take 1 capsule by mouth daily.   . timolol (TIMOPTIC) 0.5 % ophthalmic solution Place 1 drop into both eyes 2 (two) times daily.    Facility-Administered Encounter Medications as of 06/19/2019  Medication  . clindamycin (CLEOCIN) 900 mg in dextrose 5 % 50 mL IVPB   And  . gentamicin (GARAMYCIN) 260 mg in dextrose 5 % 50 mL IVPB   Activities of Daily Living In your present state of health, do you have any difficulty performing the following activities: 06/19/2019  Hearing? Y  Comment "Detore bit in left ear"  Vision? Y  Difficulty concentrating or making decisions? N  Walking or climbing stairs? N  Dressing or bathing? N  Doing errands, shopping? N  Preparing Food and eating ? N  Using the Toilet? N  In the past six months, have you accidently leaked urine? N  Do you have problems with loss of bowel control? N  Managing your Medications? N  Managing your Finances? N  Housekeeping or managing your Housekeeping? N  Some recent data might be hidden   Patient Care Team: Bonnita Hollow, MD as PCP - General (Family Medicine) [name], MD as PCP - Electrophysiology (Cardiology) Nwobu, Lyman Bishop, MD as Consulting Physician [facility] Irene Limbo, MD as Consulting Physician (Plastic Surgery) Izora Ribas (Dermatology) Danella Sensing, MD as Consulting Physician (Dermatology) [name], MD as Consulting Physician (Oncology) Sherran Needs, NP as Nurse Practitioner (Nurse Practitioner) Hassell Done Dicky Doe, NP as Nurse Practitioner (Neurology) Frann Rider, NP as Nurse Practitioner (Neurology)   Assessment:   This is a routine wellness examination for Keith [name].  Exercise Activities and Dietary recommendations Current Exercise Habits: Home exercise routine, Type of exercise: walking, Time (Minutes): 60, Frequency (Times/Week): 7, Weekly  Exercise (Minutes/Week): 420, Intensity: Mild  Goals    . Gain weight    . Patient Stated     Regain health from stroke. Get new glasses and new dentures.    . Patient Stated     Live life due  to recent health events.       Fall Risk Fall Risk  06/19/2019 05/15/2019 02/18/2019 01/24/2018 01/23/2018  Falls in the past year? 0 1 0 0 0  Number falls in past yr: - - 0 - -  Injury with Fall? - - 0 - -   Is the patient's home free of loose throw rugs in walkways, pet beds, electrical cords, etc?   yes      Grab bars in the bathroom? yes      Handrails on the stairs?   yes      Adequate lighting?   yes  Patient rating of health (0-10): 8   Depression Screen PHQ 2/9 Scores 06/19/2019 05/15/2019 05/08/2019 02/18/2019  PHQ - 2 Score 0 0 2 0  PHQ- 9 Score - - 6 -   Cognitive Function MMSE - Mini Mental State Exam 01/23/2018  Orientation to time 5  Orientation to Place 5  Registration 3  Attention/ Calculation 5  Recall 3  Language- name 2 objects 2  Language- repeat 1  Language- follow 3 step command 3  Language- read & follow direction 1  Write a sentence 1  Copy design 1  Total score 30   6CIT Screen 06/19/2019 01/23/2018  What Year? 0 points 0 points  What month? 0 points 0 points  What time? 0 points 0 points  Count back from 20 0 points 0 points  Months in reverse 0 points 0 points  Repeat phrase 0 points 0 points  Total Score 0 0   Immunization History  Administered Date(s) Administered  . Influenza,inj,Quad PF,6+ Mos 12/10/2017, 02/18/2019  . PFIZER SARS-COV-2 Vaccination 03/11/2019, 04/01/2019  . Pneumococcal Polysaccharide-23 02/18/2019  . Tdap 06/24/2014   Screening Tests Health Maintenance  Topic Date Due  . Hepatitis C Screening  Never done  . INFLUENZA VACCINE  08/17/2019  . PNA vac Low Risk Adult (2 of 2 - PCV13) 02/18/2020  . TETANUS/TDAP  06/23/2024  . COVID-19 Vaccine  Completed   Cancer Screenings: Lung: Low Dose CT Chest recommended if Age 45-80 years, 30  pack-year currently smoking OR have quit w/in 15years. Patient does not qualify. Colorectal: UTD  Plan:  Continue walking [employer] daily, this is great for exercise! Schedule an apt with new PCP sometime this summer.   Continue staying positive!   I have personally reviewed and noted the following in the patient's chart:   . Medical and social history . Use of alcohol, tobacco or illicit drugs  . Current medications and supplements . Functional ability and status . Nutritional status . Physical activity . Advanced directives . List of other physicians . Hospitalizations, surgeries, and ER visits in previous 12 months . Vitals . Screenings to include cognitive, depression, and falls . Referrals and appointments  In addition, I have reviewed and discussed with patient certain preventive protocols, quality metrics, and best practice recommendations. A written personalized care plan for preventive services as well as general preventive health recommendations were provided to patient.  This visit was conducted virtually in the setting of the [place] pandemic.   Dorna Bloom, CMA  06/24/2019   I have reviewed this visit and agree with the documentation.   Marny Lowenstein, MD, MS FAMILY MEDICINE RESIDENT - PGY3 06/24/2019 4:57 PM

## 2019-06-24 NOTE — Patient Instructions (Addendum)
You spoke to Keith [name], Keith [name] over the phone for your annual wellness visit.  We discussed goals: Goals    . Gain weight    . Patient Stated     Regain health from stroke. Get new glasses and new dentures.    . Patient Stated     Live life due to recent health events.       We also discussed recommended health maintenance. Please call our office and schedule a visit this summer with your new PCP. As discussed, you are only due for Hepatitis C screening. This can be completed at next office visit.   Health Maintenance  Topic Date Due  . Hepatitis C Screening  Never done  . INFLUENZA VACCINE  08/17/2019  . PNA vac Low Risk Adult (2 of 2 - PCV13) 02/18/2020  . TETANUS/TDAP  06/23/2024  . COVID-19 Vaccine  Completed   Continue walking [employer] daily, this is great for exercise! Schedule an apt with new PCP sometime this summer.   Continue staying positive!    Preventive Care 22 Years and Older, Male Preventive care refers to lifestyle choices and visits with your health care provider that can promote health and wellness. This includes:  A yearly physical exam. This is also called an annual well check.  Regular dental and eye exams.  Immunizations.  Screening for certain conditions.  Healthy lifestyle choices, such as diet and exercise. What can I expect for my preventive care visit? Physical exam Your health care provider will check:  Height and weight. These may be used to calculate body mass index (BMI), which is a measurement that tells if you are at a healthy weight.  Heart rate and blood pressure.  Your skin for abnormal spots. Counseling Your health care provider may ask you questions about:  Alcohol, tobacco, and drug use.  Emotional well-being.  Home and relationship well-being.  Sexual activity.  Eating habits.  History of falls.  Memory and ability to understand (cognition).  Work and work Statistician. What immunizations do I  need?  Influenza (flu) vaccine  This is recommended every year. Tetanus, diphtheria, and pertussis (Tdap) vaccine  You may need a Td booster every 10 years. Varicella (chickenpox) vaccine  You may need this vaccine if you have not already been vaccinated. Zoster (shingles) vaccine  You may need this after age 78. Pneumococcal conjugate (PCV13) vaccine  One dose is recommended after age 78. Pneumococcal polysaccharide (PPSV23) vaccine  One dose is recommended after age 78. Measles, mumps, and rubella (MMR) vaccine  You may need at least one dose of MMR if you were born in 1957 or later. You may also need a second dose. Meningococcal conjugate (MenACWY) vaccine  You may need this if you have certain conditions. Hepatitis A vaccine  You may need this if you have certain conditions or if you travel or work in places where you may be exposed to hepatitis A. Hepatitis B vaccine  You may need this if you have certain conditions or if you travel or work in places where you may be exposed to hepatitis B. Haemophilus influenzae type b (Hib) vaccine  You may need this if you have certain conditions. You may receive vaccines as individual doses or as more than one vaccine together in one shot (combination vaccines). Talk with your health care provider about the risks and benefits of combination vaccines. What tests do I need? Blood tests  Lipid and cholesterol levels. These may be checked every  5 years, or more frequently depending on your overall health.  Hepatitis C test.  Hepatitis B test. Screening  Lung cancer screening. You may have this screening every year starting at age 78 if you have a 30-pack-year history of smoking and currently smoke or have quit within the past 15 years.  Colorectal cancer screening. All adults should have this screening starting at age 78 and continuing until age 35. Your health care provider may recommend screening at age 29 if you are at  increased risk. You will have tests every 1-10 years, depending on your results and the type of screening test.  Prostate cancer screening. Recommendations will vary depending on your family history and other risks.  Diabetes screening. This is done by checking your blood sugar (glucose) after you have not eaten for a while (fasting). You may have this done every 1-3 years.  Abdominal aortic aneurysm (AAA) screening. You may need this if you are a current or former smoker.  Sexually transmitted disease (STD) testing. Follow these instructions at home: Eating and drinking  Eat a diet that includes fresh fruits and vegetables, whole grains, lean protein, and low-fat dairy products. Limit your intake of foods with high amounts of sugar, saturated fats, and salt.  Take vitamin and mineral supplements as recommended by your health care provider.  Do not drink alcohol if your health care provider tells you not to drink.  If you drink alcohol: ? Limit how much you have to 0-2 drinks a day. ? Be aware of how much alcohol is in your drink. In the [place] one drink equals one 12 oz bottle of beer (355 mL), one 5 oz glass of wine (148 mL), or one 1 oz glass of hard liquor (44 mL). Lifestyle  Take daily care of your teeth and gums.  Stay active. Exercise for at least 30 minutes on 5 or more days each week.  Do not use any products that contain nicotine or tobacco, such as cigarettes, e-cigarettes, and chewing tobacco. If you need help quitting, ask your health care provider.  If you are sexually active, practice safe sex. Use a condom or other form of protection to prevent STIs (sexually transmitted infections).  Talk with your health care provider about taking a low-dose aspirin or statin. What's next?  Visit your health care provider once a year for a well check visit.  Ask your health care provider how often you should have your eyes and teeth checked.  Stay up to date on all  vaccines. This information is not intended to replace advice given to you by your health care provider. Make sure you discuss any questions you have with your health care provider. Document Revised: 12/27/2017 Document Reviewed: 12/27/2017 Elsevier Patient Education  2020 [place] [facility]'s number is [phone]. Please call with questions or concerns about what we discussed today.

## 2019-06-30 ENCOUNTER — Ambulatory Visit (INDEPENDENT_AMBULATORY_CARE_PROVIDER_SITE_OTHER): Payer: Medicare Other | Admitting: *Deleted

## 2019-06-30 DIAGNOSIS — I63232 Cerebral infarction due to unspecified occlusion or stenosis of left carotid arteries: Secondary | ICD-10-CM | POA: Diagnosis not present

## 2019-07-01 LAB — CUP PACEART REMOTE DEVICE CHECK
Date Time Interrogation Session: 20210614012326
Implantable Pulse Generator Implant Date: 20181108

## 2019-07-02 NOTE — Progress Notes (Signed)
Carelink Summary Report / Loop Recorder 

## 2019-07-07 DIAGNOSIS — L131 Subcorneal pustular dermatitis: Secondary | ICD-10-CM | POA: Diagnosis not present

## 2019-07-23 DIAGNOSIS — Z85820 Personal history of malignant melanoma of skin: Secondary | ICD-10-CM | POA: Diagnosis not present

## 2019-07-23 DIAGNOSIS — C189 Malignant neoplasm of colon, unspecified: Secondary | ICD-10-CM | POA: Diagnosis not present

## 2019-08-04 ENCOUNTER — Ambulatory Visit (INDEPENDENT_AMBULATORY_CARE_PROVIDER_SITE_OTHER): Payer: Medicare Other | Admitting: *Deleted

## 2019-08-04 DIAGNOSIS — I63232 Cerebral infarction due to unspecified occlusion or stenosis of left carotid arteries: Secondary | ICD-10-CM

## 2019-08-04 LAB — CUP PACEART REMOTE DEVICE CHECK
Date Time Interrogation Session: 20210719001757
Implantable Pulse Generator Implant Date: 20181108

## 2019-08-06 NOTE — Progress Notes (Signed)
Carelink Summary Report / Loop Recorder 

## 2019-08-22 DIAGNOSIS — N184 Chronic kidney disease, stage 4 (severe): Secondary | ICD-10-CM | POA: Diagnosis not present

## 2019-08-25 DIAGNOSIS — D631 Anemia in chronic kidney disease: Secondary | ICD-10-CM | POA: Diagnosis not present

## 2019-08-25 DIAGNOSIS — N184 Chronic kidney disease, stage 4 (severe): Secondary | ICD-10-CM | POA: Diagnosis not present

## 2019-08-25 DIAGNOSIS — Z8673 Personal history of transient ischemic attack (TIA), and cerebral infarction without residual deficits: Secondary | ICD-10-CM | POA: Diagnosis not present

## 2019-09-04 LAB — CUP PACEART REMOTE DEVICE CHECK
Date Time Interrogation Session: 20210819011339
Implantable Pulse Generator Implant Date: 20181108

## 2019-09-08 ENCOUNTER — Ambulatory Visit (INDEPENDENT_AMBULATORY_CARE_PROVIDER_SITE_OTHER): Payer: Medicare Other | Admitting: *Deleted

## 2019-09-08 DIAGNOSIS — I63232 Cerebral infarction due to unspecified occlusion or stenosis of left carotid arteries: Secondary | ICD-10-CM | POA: Diagnosis not present

## 2019-09-12 NOTE — Progress Notes (Signed)
Carelink Summary Report / Loop Recorder 

## 2019-09-26 ENCOUNTER — Other Ambulatory Visit: Payer: Self-pay

## 2019-09-26 ENCOUNTER — Inpatient Hospital Stay: Payer: Medicare Other | Attending: Oncology | Admitting: Oncology

## 2019-09-26 ENCOUNTER — Inpatient Hospital Stay: Payer: Medicare Other

## 2019-09-26 VITALS — BP 137/95 | HR 52 | Temp 95.7°F | Resp 18 | Ht 69.0 in | Wt 116.2 lb

## 2019-09-26 DIAGNOSIS — N289 Disorder of kidney and ureter, unspecified: Secondary | ICD-10-CM | POA: Diagnosis not present

## 2019-09-26 DIAGNOSIS — C434 Malignant melanoma of scalp and neck: Secondary | ICD-10-CM | POA: Diagnosis not present

## 2019-09-26 DIAGNOSIS — Z79899 Other long term (current) drug therapy: Secondary | ICD-10-CM | POA: Insufficient documentation

## 2019-09-26 DIAGNOSIS — D649 Anemia, unspecified: Secondary | ICD-10-CM

## 2019-09-26 DIAGNOSIS — D500 Iron deficiency anemia secondary to blood loss (chronic): Secondary | ICD-10-CM | POA: Diagnosis not present

## 2019-09-26 DIAGNOSIS — C187 Malignant neoplasm of sigmoid colon: Secondary | ICD-10-CM | POA: Insufficient documentation

## 2019-09-26 LAB — CBC WITH DIFFERENTIAL (CANCER CENTER ONLY)
Abs Immature Granulocytes: 0.03 10*3/uL (ref 0.00–0.07)
Basophils Absolute: 0 10*3/uL (ref 0.0–0.1)
Basophils Relative: 1 %
Eosinophils Absolute: 0.2 10*3/uL (ref 0.0–0.5)
Eosinophils Relative: 4 %
HCT: 37.9 % — ABNORMAL LOW (ref 39.0–52.0)
Hemoglobin: 11.7 g/dL — ABNORMAL LOW (ref 13.0–17.0)
Immature Granulocytes: 1 %
Lymphocytes Relative: 24 %
Lymphs Abs: 1.3 10*3/uL (ref 0.7–4.0)
MCH: 28 pg (ref 26.0–34.0)
MCHC: 30.9 g/dL (ref 30.0–36.0)
MCV: 90.7 fL (ref 80.0–100.0)
Monocytes Absolute: 0.5 10*3/uL (ref 0.1–1.0)
Monocytes Relative: 10 %
Neutro Abs: 3.3 10*3/uL (ref 1.7–7.7)
Neutrophils Relative %: 60 %
Platelet Count: 196 10*3/uL (ref 150–400)
RBC: 4.18 MIL/uL — ABNORMAL LOW (ref 4.22–5.81)
RDW: 13.8 % (ref 11.5–15.5)
WBC Count: 5.4 10*3/uL (ref 4.0–10.5)
nRBC: 0 % (ref 0.0–0.2)

## 2019-09-26 LAB — CMP (CANCER CENTER ONLY)
ALT: 7 U/L (ref 0–44)
AST: 15 U/L (ref 15–41)
Albumin: 3.5 g/dL (ref 3.5–5.0)
Alkaline Phosphatase: 86 U/L (ref 38–126)
Anion gap: 5 (ref 5–15)
BUN: 21 mg/dL (ref 8–23)
CO2: 26 mmol/L (ref 22–32)
Calcium: 9.4 mg/dL (ref 8.9–10.3)
Chloride: 106 mmol/L (ref 98–111)
Creatinine: 2.47 mg/dL — ABNORMAL HIGH (ref 0.61–1.24)
GFR, Est AFR Am: 28 mL/min — ABNORMAL LOW (ref >60)
GFR, Estimated: 24 mL/min — ABNORMAL LOW (ref >60)
Glucose, Bld: 91 mg/dL (ref 70–99)
Potassium: 5.1 mmol/L (ref 3.5–5.1)
Sodium: 137 mmol/L (ref 135–145)
Total Bilirubin: 0.3 mg/dL (ref 0.3–1.2)
Total Protein: 7.1 g/dL (ref 6.5–8.1)

## 2019-09-26 LAB — IRON AND TIBC
Iron: 48 ug/dL (ref 42–163)
Saturation Ratios: 15 % — ABNORMAL LOW (ref 20–55)
TIBC: 323 ug/dL (ref 202–409)
UIBC: 275 ug/dL (ref 117–376)

## 2019-09-26 LAB — FERRITIN: Ferritin: 28 ng/mL (ref 24–336)

## 2019-09-26 NOTE — Progress Notes (Signed)
Hematology and Oncology Follow Up   Keith [name] [id number] May 31, 1940 79 y.o. 09/26/2019 1:08 PM Keith [name], MDThompson, Corena Pilgrim, MD      Principle Diagnosis: 79 year old man with melanoma of the scalp diagnosed in April 2019.  He was found to have stage IIC disease.  Prior Therapy:   He is status post excision of a scalp lesion and sentinel lymph node biopsy and a selective left neck dissection completed in April 2019.  Current therapy: Active surveillance.    Interim History: [name] returns today for a repeat evaluation.  Since the last visit, he was hospitalized in April 2021 and found to have worsening anemia and his work-up showed a mass in the colon.  He underwent a colonoscopy and endoscopy and was diagnosed with transverse colon mass in the sigmoid polyp.  Both had adenocarcinoma the final pathology indicating 2 synchronous lesions.  He was evaluated by Dr. Marcello Moores recommended surgical resection.   Clinically, he reports no complaints at this time. He denies any abdominal pain or weight loss. He denies any hematochezia or melena. He still has not scheduled his colon surgery. He is still contemplating whether to proceed or not.    Medications: Unchanged on review. Current Outpatient Medications  Medication Sig Dispense Refill  . atorvastatin (LIPITOR) 40 MG tablet TAKE 1 TABLET (40 MG TOTAL) BY MOUTH DAILY AT 6 PM. 90 tablet 1  . cholecalciferol (VITAMIN D3) 25 MCG (1000 UNIT) tablet Take 1,000 Units by mouth daily.    . Dapsone 5 % topical gel Apply 1 application topically 2 (two) times daily.     . famotidine (PEPCID) 20 MG tablet TAKE 1 TABLET BY MOUTH TWICE A DAY 180 tablet 1  . ferrous sulfate 325 (65 FE) MG tablet Take 325 mg by mouth daily.     . Multiple Vitamin (MULTIVITAMIN) capsule Take 1 capsule by mouth daily.     . timolol (TIMOPTIC) 0.5 % ophthalmic solution Place 1 drop into both eyes 2 (two) times daily.   3   No current facility-administered  medications for this visit.   Facility-Administered Medications Ordered in Other Visits  Medication Dose Route Frequency Provider Last Rate Last Admin  . clindamycin (CLEOCIN) 900 mg in dextrose 5 % 50 mL IVPB  900 mg Intravenous 60 min Pre-Op Leighton Ruff, MD       And  . gentamicin (GARAMYCIN) 260 mg in dextrose 5 % 50 mL IVPB  5 mg/kg Intravenous 60 min Pre-Op Leighton Ruff, MD         Allergies:  Allergies  Allergen Reactions  . Penicillins Other (See Comments)    UNKNOWN REACTION FROM CHILDHOOD  Has patient had a PCN reaction causing immediate rash, facial/tongue/throat swelling, SOB or lightheadedness with hypotension: Unknown Has patient had a PCN reaction causing severe rash involving mucus membranes or skin necrosis: Unknown Has patient had a PCN reaction that required hospitalization: Unknown Has patient had a PCN reaction occurring within the last 10 years: No If all of the above answers are "NO", then may proceed with Cephalosporin use.    Physical exam:   Blood pressure (!) 137/95, pulse (!) 52, temperature (!) 95.7 F (35.4 C), temperature source Tympanic, resp. rate 18, height 5\' 9"  (1.753 m), weight 116 lb 3.2 oz (52.7 kg), SpO2 100 %.     ECOG 1   General appearance: Alert, awake without any distress. Head: Atraumatic without abnormalities Oropharynx: Without any thrush or ulcers. Eyes: No scleral icterus. Lymph nodes: No  lymphadenopathy noted in the cervical, supraclavicular, or axillary nodes Heart:regular rate and rhythm, without any murmurs or gallops.   Lung: Clear to auscultation without any rhonchi, wheezes or dullness to percussion. Abdomin: Soft, nontender without any shifting dullness or ascites. Musculoskeletal: No clubbing or cyanosis. Neurological: No motor or sensory deficits. Skin: No rashes or lesions.       Lab Results: Lab Results  Component Value Date   WBC 5.8 05/15/2019   HGB 10.2 (L) 05/15/2019   HCT 30.9 (L) 05/15/2019    MCV 89 05/15/2019   PLT 277 05/15/2019     Chemistry      Component Value Date/Time   NA 143 05/15/2019 1602   K 4.5 05/15/2019 1602   CL 108 (H) 05/15/2019 1602   CO2 22 05/15/2019 1602   BUN 22 05/15/2019 1602   CREATININE 2.97 (H) 05/15/2019 1602   CREATININE 2.48 (H) 03/26/2019 1127      Component Value Date/Time   CALCIUM 9.2 05/15/2019 1602   CALCIUM 7.5 (L) 07/11/2010 0400   ALKPHOS 64 05/08/2019 1858   AST 17 05/08/2019 1858   AST 16 03/26/2019 1127   ALT 9 05/08/2019 1858   ALT 7 03/26/2019 1127   BILITOT 0.2 (L) 05/08/2019 1858   BILITOT 0.3 03/26/2019 1127     IMPRESSION: 1. Tree-in-bud ground-glass nodular airspace disease within the right middle and right lower lobes could reflect aspiration given recent colonoscopy. The appearance is nonspecific and could also reflect underlying inflammation or infection. 2. Suspect wall thickening at the junction of the descending and sigmoid colon with mild pericolonic fat stranding, which could correspond to the reported colonic mass identified at colonoscopy. 3. No evidence for metastatic disease. 4. Stable appearance of bilateral renal cortical atrophy and bilateral indeterminate renal hypodensities. 5. Aortic Atherosclerosis (ICD10-I70.0).  Impression and Plan:  79 year old man with:  1.    Stage IIc melanoma of the scalp diagnosed in March 2019.     He is currently on active surveillance after surgical resection without any evidence of recurrent disease.  Imaging studies obtained in April 2021 were reviewed and showed no evidence of metastatic disease.  At this time I recommended continued active surveillance.   2.  Dermatology surveillance: He is up-to-date at this time is to follow with dermatology.  3.  Anemia: Multifactorial related to his renal insufficiency as well as chronic blood loss from his GI malignancy.   4.  Colon cancer: He has synchronous lesions at the sigmoid colon as well as  transverse colon.  The was evaluated by Dr. Marcello Moores and recommended surgical resection.  The natural course of this disease was reviewed and treatment options were discussed.  The role of systemic therapy was also reviewed in case he has metastatic disease or lymph node involvement.  The risk of not proceeding with surgery include obstruction and abdominal pain and ultimately the need for urgent surgical resection were discussed.   He appreciates the input today and will decide on surgery in the near future. He is still overall hesitant because of his other health issues and the morbidity associated with surgery. It is certainly understandable to have some hesitation regarding this surgery however the alternative would be rather devastating especially with possibility of bowel obstruction and worsening symptoms.  He will think about it and discuss it with his family and schedule surgery if he opts to.  5. Follow-up: 6 months for repeat follow-up.  30  minutes were spent on this encounter.  Time was  dedicated to reviewing his disease status, reviewing imaging studies and outlining treatment options and future plan of care.  Zola Button, MD 09/26/2019 1:08 PM

## 2019-09-29 ENCOUNTER — Telehealth: Payer: Self-pay | Admitting: Oncology

## 2019-09-29 NOTE — Telephone Encounter (Signed)
Scheduled appointment per 9/10 los. Patient is aware of appointment date and time.

## 2019-10-07 DIAGNOSIS — L131 Subcorneal pustular dermatitis: Secondary | ICD-10-CM | POA: Diagnosis not present

## 2019-10-07 DIAGNOSIS — Z85820 Personal history of malignant melanoma of skin: Secondary | ICD-10-CM | POA: Diagnosis not present

## 2019-10-07 DIAGNOSIS — D692 Other nonthrombocytopenic purpura: Secondary | ICD-10-CM | POA: Diagnosis not present

## 2019-10-07 DIAGNOSIS — D485 Neoplasm of uncertain behavior of skin: Secondary | ICD-10-CM | POA: Diagnosis not present

## 2019-10-07 DIAGNOSIS — L281 Prurigo nodularis: Secondary | ICD-10-CM | POA: Diagnosis not present

## 2019-10-07 DIAGNOSIS — C44619 Basal cell carcinoma of skin of left upper limb, including shoulder: Secondary | ICD-10-CM | POA: Diagnosis not present

## 2019-10-13 ENCOUNTER — Ambulatory Visit (INDEPENDENT_AMBULATORY_CARE_PROVIDER_SITE_OTHER): Payer: Medicare Other | Admitting: Emergency Medicine

## 2019-10-13 DIAGNOSIS — I63232 Cerebral infarction due to unspecified occlusion or stenosis of left carotid arteries: Secondary | ICD-10-CM | POA: Diagnosis not present

## 2019-10-14 LAB — CUP PACEART REMOTE DEVICE CHECK
Date Time Interrogation Session: 20210919011657
Implantable Pulse Generator Implant Date: 20181108

## 2019-10-15 NOTE — Progress Notes (Signed)
Carelink Summary Report / Loop Recorder 

## 2019-10-16 DIAGNOSIS — C184 Malignant neoplasm of transverse colon: Secondary | ICD-10-CM | POA: Diagnosis not present

## 2019-11-05 ENCOUNTER — Ambulatory Visit (HOSPITAL_COMMUNITY)
Admission: EM | Admit: 2019-11-05 | Discharge: 2019-11-05 | Disposition: A | Discharge status: Home / Self Care | Payer: Medicare Other | Attending: Family Medicine | Admitting: Family Medicine

## 2019-11-05 ENCOUNTER — Other Ambulatory Visit: Payer: Self-pay

## 2019-11-05 ENCOUNTER — Encounter (HOSPITAL_COMMUNITY): Payer: Self-pay | Admitting: Emergency Medicine

## 2019-11-05 ENCOUNTER — Ambulatory Visit (INDEPENDENT_AMBULATORY_CARE_PROVIDER_SITE_OTHER): Payer: Medicare Other

## 2019-11-05 DIAGNOSIS — M79605 Pain in left leg: Secondary | ICD-10-CM

## 2019-11-05 DIAGNOSIS — I63232 Cerebral infarction due to unspecified occlusion or stenosis of left carotid arteries: Secondary | ICD-10-CM | POA: Diagnosis not present

## 2019-11-05 DIAGNOSIS — M1612 Unilateral primary osteoarthritis, left hip: Secondary | ICD-10-CM | POA: Diagnosis not present

## 2019-11-05 DIAGNOSIS — R52 Pain, unspecified: Secondary | ICD-10-CM

## 2019-11-05 MED ORDER — TRAMADOL HCL 50 MG PO TABS: 50.0000 mg | ORAL_TABLET | Freq: Two times a day (BID) | ORAL | 0 refills | Status: DC | PRN

## 2019-11-05 MED ORDER — ACETAMINOPHEN 500 MG PO TABS: 500.0000 mg | ORAL_TABLET | Freq: Four times a day (QID) | ORAL | 0 refills | Status: DC | PRN

## 2019-11-05 NOTE — ED Provider Notes (Signed)
[facility]    CSN: [id number] Arrival date & time: 11/05/19  1240      History   Chief Complaint Chief Complaint  Patient presents with  . Leg Pain    HPI Keith [name] is a 79 y.o. male.   Keith [name] presents with complaints of left thigh pain. Started a few days ago, no known injury. No back pain. Denies any previous similar. No falls or known trauma to the affected area. Today pain worsened and he is unable to bear weight due to pain. No numbness or tingling. Took an ibuprofen and aspirin this morning which didn't help. History of stroke, had been on a blood thinner in the past related to afib, but this had to be discontinued recently due to a GI bleed. It has not yet been restarted. States he was told a polyp biopsy did return as positive for cancer, adenocarcinoma, so is awaiting surgery for this. Also with history of melanoma. Had been very active until the past few days has limited him due to pain.     ROS per HPI, negative if not otherwise mentioned.      Past Medical History:  Diagnosis Date  . Cataract    planning surgery  . Chronic kidney disease    "weak Kidneys"  . Complication of anesthesia    patient expressed that he has difficulty post anesthesia with swallowing, chewing, and talking after surgery  . GERD (gastroesophageal reflux disease)    tums prn  . Glaucoma   . History of hiatal hernia   . Hypothyroidism   . Hypothyroidism, postsurgical 11/20/2016  . 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 Melanoma ([place])    scalp  . Melanoma of scalp ([place]) 04/23/2017  . Obstructive uropathy   . PONV (postoperative nausea and vomiting) 03/20/2017  . Stroke ([place]) 11/2016   speech and right side at time of stroke 04/20/17- all function return to normal  . Underweight 01/24/2018    Patient Active Problem List   Diagnosis Date Noted  . Adenocarcinoma ([place]) 05/22/2019  . Mass of colon   . Symptomatic anemia 05/08/2019  .  Gastroesophageal reflux disease 02/18/2019  . Iron deficiency 09/26/2018  . Anemia in stage 4 chronic kidney disease ([place]) 05/21/2018  . Vitamin D deficiency 05/21/2018  . History of CVA (cerebrovascular accident) 01/31/2018  . TSH elevation 01/28/2018  . Underweight 01/24/2018  . CKD (chronic kidney disease) stage 4, GFR 15-29 ml/min (HCC) 12/10/2017  . Hypotension 12/10/2017  . Paroxysmal atrial fibrillation ([place]) 11/07/2017  . Bilateral carotid artery stenosis 09/03/2017  . Malignant melanoma of skin of scalp ([place]) 03/26/2017  . Hyperlipidemia 02/08/2017  . SAH (subarachnoid hemorrhage) ([place]) 11/20/2016  . Cerebrovascular accident (CVA) due to occlusion of left carotid artery ([place]) 11/20/2016    Past Surgical History:  Procedure Laterality Date  . APPLICATION OF A-CELL OF HEAD/NECK N/A 04/23/2017   Procedure: APPLICATION OF INTREGRA;  Surgeon: Irene Limbo, MD;  Location: [facility];  Service: Plastics;  Laterality: N/A;  . BIOPSY  05/10/2019   Procedure: BIOPSY;  Surgeon: [name], MD;  Location: [facility] ENDOSCOPY;  Service: Endoscopy;;  . CATARACT EXTRACTION    . COLONOSCOPY WITH PROPOFOL N/A 05/10/2019   Procedure: COLONOSCOPY WITH PROPOFOL;  Surgeon: [name], MD;  Location: [facility] ENDOSCOPY;  Service: Endoscopy;  Laterality: N/A;  . ESOPHAGOGASTRODUODENOSCOPY (EGD) WITH PROPOFOL N/A 05/10/2019   Procedure: ESOPHAGOGASTRODUODENOSCOPY (EGD) WITH PROPOFOL;  Surgeon: [name], MD;  Location: [facility];  Service: Endoscopy;  Laterality: N/A;  . EXCISION MASS HEAD N/A 03/20/2017   Procedure: EXCISION scalp lesion;  Surgeon: Irene Limbo, MD;  Location: [facility];  Service: Plastics;  Laterality: N/A;  EXCISION scalp lesion  . EYE SURGERY    . IR ANGIO INTRA EXTRACRAN SEL COM CAROTID INNOMINATE BILAT MOD SED  11/23/2016  . IR ANGIO VERTEBRAL SEL VERTEBRAL UNI L MOD SED  11/23/2016  . LOOP RECORDER INSERTION N/A 11/23/2016   Procedure: LOOP RECORDER INSERTION;  Surgeon: [name], MD;  Location: [facility] CV LAB;  Service: Cardiovascular;  Laterality: N/A;  . MASS EXCISION N/A 04/23/2017   Procedure: EXCISION MALIGNANT LESION OF SCALP;  Surgeon: Irene Limbo, MD;  Location: [facility];  Service: Plastics;  Laterality: N/A;  . MELANOMA EXCISION  04/23/2017   BILATERAL SUPERFICIAL PAROTIDECTOMY/NECK SENTINEL NODE BIOPSY WITH FROZEN SECTION; LEFT SELECTIVE NECK DISSECTION  . PAROTIDECTOMY Bilateral 04/23/2017   Procedure: BILATERAL SUPERFICIAL PAROTIDECTOMY/NECK SENTINEL NODE BIOPSY WITH FROZEN SECTION; LEFT SELECTIVE NECK DISSECTION;  Surgeon: [name], MD;  Location: [facility];  Service: ENT;  Laterality: Bilateral;  . POLYPECTOMY  05/10/2019   Procedure: POLYPECTOMY;  Surgeon: [name], MD;  Location: [facility];  Service: Endoscopy;;  . SKIN GRAFT SPLIT THICKNESS HEAD / NECK  06/04/2017   SKIN GRAFT SPLIT THICKNESS TO SCALP FROM RIGHT OR LEFT THIGH POSSIBLE A CELL TO DONOR SITE  . SKIN SPLIT GRAFT N/A 06/04/2017   Procedure: SKIN GRAFT SPLIT THICKNESS TO SCALP FROM RIGHT OR LEFT THIGH POSSIBLE A CELL TO DONOR SITE;  Surgeon: Irene Limbo, MD;  Location: [facility];  Service: Plastics;  Laterality: N/A;  . SUBMUCOSAL TATTOO INJECTION  05/10/2019   Procedure: SUBMUCOSAL TATTOO INJECTION;  Surgeon: [name], MD;  Location: [facility] ENDOSCOPY;  Service: Endoscopy;;  . THYROIDECTOMY    . TONSILLECTOMY         Home Medications    Prior to Admission medications   Medication Sig Start Date End Date Taking? Authorizing Provider  aspirin EC 81 MG tablet Take 81 mg by mouth daily. Swallow whole.   Yes [provider]  acetaminophen (TYLENOL) 500 MG tablet Take 1 tablet (500 mg total) by mouth every 6 (six) hours as needed. 11/05/19   Zigmund Gottron, NP  atorvastatin (LIPITOR) 40 MG tablet TAKE 1 TABLET (40 MG TOTAL) BY MOUTH DAILY AT 6 PM. 03/28/19   Bonnita Hollow, MD  cholecalciferol (VITAMIN D3) 25 MCG (1000 UNIT) tablet Take 1,000 Units by mouth  daily.    [provider]  Dapsone 5 % topical gel Apply 1 application topically 2 (two) times daily.  02/08/18   [provider]  famotidine (PEPCID) 20 MG tablet TAKE 1 TABLET BY MOUTH TWICE A DAY 05/12/19   Bonnita Hollow, MD  ferrous sulfate 325 (65 FE) MG tablet Take 325 mg by mouth daily.     [provider]  Multiple Vitamin (MULTIVITAMIN) capsule Take 1 capsule by mouth daily.     [provider]  timolol (TIMOPTIC) 0.5 % ophthalmic solution Place 1 drop into both eyes 2 (two) times daily.  11/24/17   [provider]  traMADol (ULTRAM) 50 MG tablet Take 1 tablet (50 mg total) by mouth every 12 (twelve) hours as needed. 11/05/19   Zigmund Gottron, NP    Family History Family History  Problem Relation Age of Onset  . Lung cancer Father   . Lung cancer Mother   . Diabetes Brother   .  Colon cancer Neg Hx   . Colon polyps Neg Hx   . Esophageal cancer Neg Hx   . Stomach cancer Neg Hx   . Pancreatic cancer Neg Hx     Social History Social History   Tobacco Use  . Smoking status: Former Smoker    Packs/day: 2.00    Years: 32.00    Pack years: 64.00    Types: Cigarettes    Quit date: 1983    Years since quitting: 38.8  . Smokeless tobacco: Never Used  . Tobacco comment: No plans to start  Vaping Use  . Vaping Use: Never used  Substance Use Topics  . Alcohol use: No  . Drug use: No     Allergies   Penicillins   Review of Systems Review of Systems   Physical Exam Triage Vital Signs ED Triage Vitals  Enc Vitals Group     BP 11/05/19 1506 (!) 127/46     Pulse Rate 11/05/19 1506 76     Resp 11/05/19 1506 16     Temp 11/05/19 1506 98 F (36.7 C)     Temp Source 11/05/19 1506 Oral     SpO2 11/05/19 1506 100 %     Weight 11/05/19 1504 116 lb 2.9 oz (52.7 kg)     Height 11/05/19 1504 5\' 9"  (1.753 m)     Head Circumference --      Peak Flow --      Pain Score 11/05/19 1503 10     Pain Loc --      Pain Edu? --       Excl. in [place]? --    No data found.  Updated Vital Signs BP (!) 127/46 (BP Location: Right Arm)   Pulse 76   Temp 98 F (36.7 C) (Oral)   Resp 16   Ht 5\' 9"  (1.753 m)   Wt 116 lb 2.9 oz (52.7 kg)   SpO2 100%   BMI 17.16 kg/m   Visual Acuity Right Eye Distance:   Left Eye Distance:   Bilateral Distance:    Right Eye Near:   Left Eye Near:    Bilateral Near:     Physical Exam Constitutional:      Appearance: He is well-developed.     Comments: Very thin  Cardiovascular:     Rate and Rhythm: Normal rate.  Pulmonary:     Effort: Pulmonary effort is normal.  Musculoskeletal:     Left upper leg: Tenderness and bony tenderness present. No swelling, edema or deformity.     Comments: Mid left thigh with tenderness to medial and lateral aspect, left proximal femur with bony tenderness on palpation; no redness, swelling or warmth to left thigh or hip; calf is soft and non tender; negative homan's sign; some pain with left hip flexion; no pain with abduction or adduction  Skin:    General: Skin is warm and dry.  Neurological:     Mental Status: He is alert and oriented to person, place, and time.      UC Treatments / Results  Labs (all labs ordered are listed, but only abnormal results are displayed) Labs Reviewed - No data to display  EKG   Radiology DG Femur Min 2 Views Left  Result Date: 11/05/2019 CLINICAL DATA:  Left leg pain, no known injury, initial encounter EXAM: LEFT FEMUR 2 VIEWS COMPARISON:  None. FINDINGS: No acute fracture or dislocation is noted. Mild degenerative changes of the hip joint are seen. Diffuse vascular calcifications  are noted. No soft tissue abnormality is seen. IMPRESSION: No acute abnormality noted. Electronically Signed   By: Inez Catalina M.D.   On: 11/05/2019 16:33    Procedures Procedures (including critical care time)  Medications Ordered in UC Medications - No data to display  Initial Impression / Assessment and Plan / UC Course    I have reviewed the triage vital signs and the nursing notes.  Pertinent labs & imaging results that were available during my care of the patient were reviewed by me and considered in my medical decision making (see chart for details).     Xray without acute findings. Thigh is not red, swollen, or bruised. No known trauma. No cough, no shortness of breath, no chest pain. Strain vs tendonitis considered. Will obtain ultrasound for dvt rule out due to his history and he voices concern for this. To call tomorrow morning as it is after 5p now, to schedule this. Encouraged close follow up with his PCP for recheck. Pain management discussed, avoided nsaid related to gi bleed. Patient and son verbalized understanding and agreeable to plan.    Final Clinical Impressions(s) / UC Diagnoses   Final diagnoses:  Pain  Left leg pain     Discharge Instructions     Your xray is normal today which is reassuring.  I would like you to start tylenol regularly, every 6 hours to help with pain Tramadol, started with 1/2 tablet, to help with pain, every 8-12 hours as needed for pain.  We will ensure this is not a blood clot related to your history, as well.  Please follow up with your PCP for recheck in the next week, especially if no improvement.     ED Prescriptions    Medication Sig Dispense Auth. Provider   acetaminophen (TYLENOL) 500 MG tablet Take 1 tablet (500 mg total) by mouth every 6 (six) hours as needed. 30 tablet Augusto Gamble B, NP   traMADol (ULTRAM) 50 MG tablet Take 1 tablet (50 mg total) by mouth every 12 (twelve) hours as needed. 15 tablet Augusto Gamble B, NP     I have reviewed the PDMP during this encounter.   Zigmund Gottron, NP 11/05/19 [phone]

## 2019-11-05 NOTE — Discharge Instructions (Addendum)
Your xray is normal today which is reassuring.  I would like you to start tylenol regularly, every 6 hours to help with pain Tramadol, started with 1/2 tablet, to help with pain, every 8-12 hours as needed for pain.  We will ensure this is not a blood clot related to your history, as well.  Please follow up with your PCP for recheck in the next week, especially if no improvement.

## 2019-11-05 NOTE — ED Triage Notes (Signed)
Patient c/o LFT sided leg pain x 2 days ago.   Patient denies any recent traumas or falls.   Patient took an NSAID at home w/o any relief.

## 2019-11-06 ENCOUNTER — Other Ambulatory Visit: Payer: Self-pay

## 2019-11-06 ENCOUNTER — Ambulatory Visit (HOSPITAL_COMMUNITY)
Admission: RE | Admit: 2019-11-06 | Discharge: 2019-11-06 | Disposition: A | Discharge status: Home / Self Care | Payer: Medicare Other | Source: Ambulatory Visit | Attending: Emergency Medicine | Admitting: Emergency Medicine

## 2019-11-06 ENCOUNTER — Ambulatory Visit: Payer: Medicare Other | Admitting: Family Medicine

## 2019-11-06 DIAGNOSIS — Z8673 Personal history of transient ischemic attack (TIA), and cerebral infarction without residual deficits: Secondary | ICD-10-CM | POA: Insufficient documentation

## 2019-11-06 DIAGNOSIS — I4891 Unspecified atrial fibrillation: Secondary | ICD-10-CM | POA: Diagnosis not present

## 2019-11-06 DIAGNOSIS — M79609 Pain in unspecified limb: Secondary | ICD-10-CM | POA: Diagnosis not present

## 2019-11-06 DIAGNOSIS — M79652 Pain in left thigh: Secondary | ICD-10-CM | POA: Diagnosis not present

## 2019-11-07 LAB — CUP PACEART REMOTE DEVICE CHECK
Date Time Interrogation Session: 20211020024825
Implantable Pulse Generator Implant Date: 20181108

## 2019-11-11 NOTE — Progress Notes (Signed)
Carelink Summary Report / Loop Recorder 

## 2019-11-20 ENCOUNTER — Encounter: Payer: Self-pay | Admitting: *Deleted

## 2019-11-20 ENCOUNTER — Telehealth: Payer: Self-pay | Admitting: *Deleted

## 2019-11-20 DIAGNOSIS — Z006 Encounter for examination for normal comparison and control in clinical research program: Secondary | ICD-10-CM

## 2019-11-20 NOTE — Telephone Encounter (Signed)
Pt called back see stroke af encounter for note

## 2019-11-20 NOTE — Research (Signed)
STROKE AF 79M  Patient doing well, no complaints.  Manual transmission completed at home.

## 2019-12-08 ENCOUNTER — Ambulatory Visit (INDEPENDENT_AMBULATORY_CARE_PROVIDER_SITE_OTHER): Payer: Medicare Other

## 2019-12-08 DIAGNOSIS — I63232 Cerebral infarction due to unspecified occlusion or stenosis of left carotid arteries: Secondary | ICD-10-CM

## 2019-12-08 LAB — CUP PACEART REMOTE DEVICE CHECK
Date Time Interrogation Session: 20211120014557
Implantable Pulse Generator Implant Date: 20181108

## 2019-12-10 NOTE — Progress Notes (Signed)
Carelink Summary Report / Loop Recorder 

## 2019-12-18 ENCOUNTER — Other Ambulatory Visit (HOSPITAL_COMMUNITY): Payer: Self-pay | Admitting: General Surgery

## 2019-12-18 ENCOUNTER — Other Ambulatory Visit: Payer: Self-pay | Admitting: General Surgery

## 2019-12-18 DIAGNOSIS — C187 Malignant neoplasm of sigmoid colon: Secondary | ICD-10-CM

## 2019-12-18 DIAGNOSIS — C184 Malignant neoplasm of transverse colon: Secondary | ICD-10-CM

## 2019-12-21 ENCOUNTER — Encounter: Payer: Self-pay | Admitting: Family Medicine

## 2019-12-22 ENCOUNTER — Ambulatory Visit (HOSPITAL_COMMUNITY): Payer: Medicare Other

## 2019-12-29 DIAGNOSIS — C189 Malignant neoplasm of colon, unspecified: Secondary | ICD-10-CM | POA: Diagnosis not present

## 2019-12-29 DIAGNOSIS — H61003 Unspecified perichondritis of external ear, bilateral: Secondary | ICD-10-CM | POA: Diagnosis not present

## 2019-12-29 DIAGNOSIS — Z85820 Personal history of malignant melanoma of skin: Secondary | ICD-10-CM | POA: Diagnosis not present

## 2020-01-01 ENCOUNTER — Ambulatory Visit (HOSPITAL_COMMUNITY)
Admission: RE | Admit: 2020-01-01 | Discharge: 2020-01-01 | Disposition: A | Discharge status: Home / Self Care | Payer: Medicare Other | Source: Ambulatory Visit | Attending: General Surgery | Admitting: General Surgery

## 2020-01-01 ENCOUNTER — Telehealth: Payer: Self-pay

## 2020-01-01 ENCOUNTER — Other Ambulatory Visit: Payer: Self-pay

## 2020-01-01 DIAGNOSIS — C184 Malignant neoplasm of transverse colon: Secondary | ICD-10-CM | POA: Insufficient documentation

## 2020-01-01 DIAGNOSIS — I2510 Atherosclerotic heart disease of native coronary artery without angina pectoris: Secondary | ICD-10-CM | POA: Diagnosis not present

## 2020-01-01 DIAGNOSIS — N261 Atrophy of kidney (terminal): Secondary | ICD-10-CM | POA: Diagnosis not present

## 2020-01-01 DIAGNOSIS — C187 Malignant neoplasm of sigmoid colon: Secondary | ICD-10-CM | POA: Insufficient documentation

## 2020-01-01 DIAGNOSIS — I700 Atherosclerosis of aorta: Secondary | ICD-10-CM | POA: Diagnosis not present

## 2020-01-01 DIAGNOSIS — K6389 Other specified diseases of intestine: Secondary | ICD-10-CM | POA: Diagnosis not present

## 2020-01-01 NOTE — Telephone Encounter (Signed)
The pt states Tammy from research told him he do not need the monitor. He can unplug it. He got a call today stating he needs to plug it in. He wants to know what he should do. I told him the nurse will reach out to research and give him a call back. He would like a call before 4 pm.

## 2020-01-05 ENCOUNTER — Telehealth: Payer: Self-pay

## 2020-01-05 NOTE — Telephone Encounter (Signed)
The pt called to speak with the nurse.

## 2020-01-05 NOTE — Telephone Encounter (Signed)
Advised patient to leave monitor in and we will contact research and find out what they recommended. Patient agreeable to plan.

## 2020-01-05 NOTE — Telephone Encounter (Signed)
Research has completed their monitoring with this patients ILR. The no longer will monitor it. Do I need to offer patient explant or leave in or have patient continue monitoring?  Please advise.

## 2020-01-06 LAB — CUP PACEART REMOTE DEVICE CHECK
Date Time Interrogation Session: 20211221020442
Implantable Pulse Generator Implant Date: 20181108

## 2020-01-07 NOTE — Telephone Encounter (Signed)
Called patient to discuss options to leave device in or explant. Patient states he will leave device in at this time. Patient advised to unplug his box and will send return kit. Address verified.

## 2020-01-20 ENCOUNTER — Telehealth: Payer: Self-pay

## 2020-01-20 DIAGNOSIS — C184 Malignant neoplasm of transverse colon: Secondary | ICD-10-CM | POA: Diagnosis not present

## 2020-01-20 NOTE — Telephone Encounter (Signed)
Keith [name], daughter in law, calls nurse line requesting another provider for general surgery. Trish reports he saw Dr. Leighton Ruff today at [facility] and he was very displeased. Trish reports Dr. Marcello Moores was very flippant today and had a side of arrogance to her they did appreciate given the circumstance. Keith [name] would like a second opinion, preferably no at the same location. Please advise on recommendations.

## 2020-01-21 ENCOUNTER — Other Ambulatory Visit: Payer: Self-pay | Admitting: Family Medicine

## 2020-01-21 DIAGNOSIS — H401133 Primary open-angle glaucoma, bilateral, severe stage: Secondary | ICD-10-CM | POA: Diagnosis not present

## 2020-01-21 DIAGNOSIS — Z961 Presence of intraocular lens: Secondary | ICD-10-CM | POA: Diagnosis not present

## 2020-01-21 DIAGNOSIS — H26492 Other secondary cataract, left eye: Secondary | ICD-10-CM | POA: Diagnosis not present

## 2020-01-21 DIAGNOSIS — C801 Malignant (primary) neoplasm, unspecified: Secondary | ICD-10-CM

## 2020-01-21 NOTE — Telephone Encounter (Signed)
New referral placed.

## 2020-03-05 DIAGNOSIS — C184 Malignant neoplasm of transverse colon: Secondary | ICD-10-CM | POA: Diagnosis not present

## 2020-03-05 DIAGNOSIS — K259 Gastric ulcer, unspecified as acute or chronic, without hemorrhage or perforation: Secondary | ICD-10-CM | POA: Diagnosis not present

## 2020-03-05 DIAGNOSIS — D122 Benign neoplasm of ascending colon: Secondary | ICD-10-CM | POA: Diagnosis not present

## 2020-03-05 DIAGNOSIS — C187 Malignant neoplasm of sigmoid colon: Secondary | ICD-10-CM | POA: Diagnosis not present

## 2020-03-25 ENCOUNTER — Other Ambulatory Visit: Payer: Self-pay

## 2020-03-25 ENCOUNTER — Inpatient Hospital Stay: Payer: Medicare Other | Attending: Oncology

## 2020-03-25 ENCOUNTER — Inpatient Hospital Stay (HOSPITAL_BASED_OUTPATIENT_CLINIC_OR_DEPARTMENT_OTHER): Payer: Medicare Other | Admitting: Oncology

## 2020-03-25 VITALS — BP 131/44 | HR 60 | Temp 97.9°F | Resp 13 | Ht 69.0 in | Wt 114.6 lb

## 2020-03-25 DIAGNOSIS — N189 Chronic kidney disease, unspecified: Secondary | ICD-10-CM | POA: Insufficient documentation

## 2020-03-25 DIAGNOSIS — Z85038 Personal history of other malignant neoplasm of large intestine: Secondary | ICD-10-CM | POA: Diagnosis not present

## 2020-03-25 DIAGNOSIS — C434 Malignant melanoma of scalp and neck: Secondary | ICD-10-CM

## 2020-03-25 DIAGNOSIS — Z85820 Personal history of malignant melanoma of skin: Secondary | ICD-10-CM | POA: Diagnosis not present

## 2020-03-25 DIAGNOSIS — Z79899 Other long term (current) drug therapy: Secondary | ICD-10-CM | POA: Diagnosis not present

## 2020-03-25 DIAGNOSIS — D649 Anemia, unspecified: Secondary | ICD-10-CM | POA: Insufficient documentation

## 2020-03-25 DIAGNOSIS — Z7982 Long term (current) use of aspirin: Secondary | ICD-10-CM | POA: Insufficient documentation

## 2020-03-25 LAB — CBC WITH DIFFERENTIAL (CANCER CENTER ONLY)
Abs Immature Granulocytes: 0.02 10*3/uL (ref 0.00–0.07)
Basophils Absolute: 0 10*3/uL (ref 0.0–0.1)
Basophils Relative: 1 %
Eosinophils Absolute: 0.2 10*3/uL (ref 0.0–0.5)
Eosinophils Relative: 4 %
HCT: 33.5 % — ABNORMAL LOW (ref 39.0–52.0)
Hemoglobin: 10.3 g/dL — ABNORMAL LOW (ref 13.0–17.0)
Immature Granulocytes: 0 %
Lymphocytes Relative: 27 %
Lymphs Abs: 1.5 10*3/uL (ref 0.7–4.0)
MCH: 28.1 pg (ref 26.0–34.0)
MCHC: 30.7 g/dL (ref 30.0–36.0)
MCV: 91.5 fL (ref 80.0–100.0)
Monocytes Absolute: 0.5 10*3/uL (ref 0.1–1.0)
Monocytes Relative: 9 %
Neutro Abs: 3.3 10*3/uL (ref 1.7–7.7)
Neutrophils Relative %: 59 %
Platelet Count: 256 10*3/uL (ref 150–400)
RBC: 3.66 MIL/uL — ABNORMAL LOW (ref 4.22–5.81)
RDW: 13.2 % (ref 11.5–15.5)
WBC Count: 5.5 10*3/uL (ref 4.0–10.5)
nRBC: 0 % (ref 0.0–0.2)

## 2020-03-25 LAB — CMP (CANCER CENTER ONLY)
ALT: 7 U/L (ref 0–44)
AST: 13 U/L — ABNORMAL LOW (ref 15–41)
Albumin: 3.4 g/dL — ABNORMAL LOW (ref 3.5–5.0)
Alkaline Phosphatase: 98 U/L (ref 38–126)
Anion gap: 5 (ref 5–15)
BUN: 25 mg/dL — ABNORMAL HIGH (ref 8–23)
CO2: 25 mmol/L (ref 22–32)
Calcium: 9.1 mg/dL (ref 8.9–10.3)
Chloride: 106 mmol/L (ref 98–111)
Creatinine: 2.38 mg/dL — ABNORMAL HIGH (ref 0.61–1.24)
GFR, Estimated: 27 mL/min — ABNORMAL LOW (ref >60)
Glucose, Bld: 85 mg/dL (ref 70–99)
Potassium: 4.6 mmol/L (ref 3.5–5.1)
Sodium: 136 mmol/L (ref 135–145)
Total Bilirubin: 0.2 mg/dL — ABNORMAL LOW (ref 0.3–1.2)
Total Protein: 7.4 g/dL (ref 6.5–8.1)

## 2020-03-25 NOTE — Progress Notes (Signed)
Hematology and Oncology Follow Up   Keith [name] [id number] [birth year] 80 y.o. 03/25/2020 2:34 PM Keith [name], MDFrank, Keith Salina, MD      Principle Diagnosis: 80 year old man with stage IIc melanoma of the scalp diagnosed in April 2019.    Prior Therapy:   He is status post excision of a scalp lesion and sentinel lymph node biopsy and a selective left neck dissection completed in April 2019.  Current therapy: Active surveillance.    Interim History: [name] is here for return follow-up visit.  Since the last visit, he reports no major changes in his health.  He denies any abdominal pain or discomfort.  He denies any nausea, vomiting or hematochezia.  He denies any shortness of breath or difficulty breathing.  Denies any recent skin rashes or lesions.      Medications: Reviewed without changes. Current Outpatient Medications  Medication Sig Dispense Refill  . acetaminophen (TYLENOL) 500 MG tablet Take 1 tablet (500 mg total) by mouth every 6 (six) hours as needed. 30 tablet 0  . aspirin EC 81 MG tablet Take 81 mg by mouth daily. Swallow whole.    Marland Kitchen atorvastatin (LIPITOR) 40 MG tablet TAKE 1 TABLET (40 MG TOTAL) BY MOUTH DAILY AT 6 PM. 90 tablet 1  . cholecalciferol (VITAMIN D3) 25 MCG (1000 UNIT) tablet Take 1,000 Units by mouth daily.    . Dapsone 5 % topical gel Apply 1 application topically 2 (two) times daily.     . famotidine (PEPCID) 20 MG tablet TAKE 1 TABLET BY MOUTH TWICE A DAY 180 tablet 1  . ferrous sulfate 325 (65 FE) MG tablet Take 325 mg by mouth daily.     . Multiple Vitamin (MULTIVITAMIN) capsule Take 1 capsule by mouth daily.     . timolol (TIMOPTIC) 0.5 % ophthalmic solution Place 1 drop into both eyes 2 (two) times daily.   3  . traMADol (ULTRAM) 50 MG tablet Take 1 tablet (50 mg total) by mouth every 12 (twelve) hours as needed. 15 tablet 0   No current facility-administered medications for this visit.   Facility-Administered Medications Ordered in  Other Visits  Medication Dose Route Frequency Provider Last Rate Last Admin  . clindamycin (CLEOCIN) 900 mg in dextrose 5 % 50 mL IVPB  900 mg Intravenous 60 min Pre-Op Leighton Ruff, MD       And  . gentamicin (GARAMYCIN) 260 mg in dextrose 5 % 50 mL IVPB  5 mg/kg Intravenous 60 min Pre-Op Leighton Ruff, MD         Allergies:  Allergies  Allergen Reactions  . Penicillins Other (See Comments)    UNKNOWN REACTION FROM CHILDHOOD  Has patient had a PCN reaction causing immediate rash, facial/tongue/throat swelling, SOB or lightheadedness with hypotension: Unknown Has patient had a PCN reaction causing severe rash involving mucus membranes or skin necrosis: Unknown Has patient had a PCN reaction that required hospitalization: Unknown Has patient had a PCN reaction occurring within the last 10 years: No If all of the above answers are "NO", then may proceed with Cephalosporin use.    Physical exam:    Blood pressure (!) 131/44, pulse 60, temperature 97.9 F (36.6 C), temperature source Tympanic, resp. rate 13, height 5\' 9"  (1.753 m), weight 114 lb 9.6 oz (52 kg), SpO2 100 %.     ECOG 1    General appearance: Comfortable appearing without any discomfort Head: Normocephalic without any trauma Oropharynx: Mucous membranes are moist and pink without any thrush  or ulcers. Eyes: Pupils are equal and round reactive to light. Lymph nodes: No cervical, supraclavicular, inguinal or axillary lymphadenopathy.   Heart:regular rate and rhythm.  S1 and S2 without leg edema. Lung: Clear without any rhonchi or wheezes.  No dullness to percussion. Abdomin: Soft, nontender, nondistended with good bowel sounds.  No hepatosplenomegaly. Musculoskeletal: No joint deformity or effusion.  Full range of motion noted. Neurological: No deficits noted on motor, sensory and deep tendon reflex exam. Skin: No petechial rash or dryness.  Appeared moist.         Lab Results: Lab Results  Component  Value Date   WBC 5.4 09/26/2019   HGB 11.7 (L) 09/26/2019   HCT 37.9 (L) 09/26/2019   MCV 90.7 09/26/2019   PLT 196 09/26/2019     Chemistry      Component Value Date/Time   NA 137 09/26/2019 1313   NA 143 05/15/2019 1602   K 5.1 09/26/2019 1313   CL 106 09/26/2019 1313   CO2 26 09/26/2019 1313   BUN 21 09/26/2019 1313   BUN 22 05/15/2019 1602   CREATININE 2.47 (H) 09/26/2019 1313      Component Value Date/Time   CALCIUM 9.4 09/26/2019 1313   CALCIUM 7.5 (L) 07/11/2010 0400   ALKPHOS 86 09/26/2019 1313   AST 15 09/26/2019 1313   ALT 7 09/26/2019 1313   BILITOT 0.3 09/26/2019 1313     IMPRESSION: 1. Near circumferential mass in the proximal transverse colon consistent with primary colorectal carcinoma. No evidence of obstruction currently. 2. Lesion in the sigmoid colon is not well appreciated by CT imaging. 3. No evidence of metastatic disease.  80 year old man with:  1.    Stage IIc melanoma of the scalp diagnosed in March 2019.    He is status status post surgical resection currently on active surveillance.  The natural course of his disease and risk of relapse was assessed again.  I recommended continued active surveillance without any intervention unless he has metastatic disease noted.  The plan is to repeat imaging studies in 3 months.   2.  Colon cancer diagnosed in April 2021.  He has a transverse colon mass that was biopsied by colonoscopy.  CT scan obtained at in December 2021 did not show any evidence of metastatic disease or obstruction.  The natural course of this disease was reviewed again and treatment options were reiterated.  I recommended proceeding with surgical resection at this time given his risk of obstruction and potential further complications.  He remains overall hesitant about the process but understands that this is his only option and best way to proceed.  He has been evaluated by Dr. Marcello Moores previously and I urged him to make another  appointment in the near future.  He is more willing to consider this at this time.  3.  Dermatology surveillance: I recommended continuing surveillance at this time.  4.  Anemia: Mild and chronic in nature.  Mostly related to chronic renal insufficiency with hemoglobin in September was 11.7.  At this time he does not require any growth factor support for intervention.  We will continue to monitor.   5. Follow-up: 6 months for a follow-up visit.  30  minutes were dedicated to this visit.  Time spent on updating disease status, discussing treatment options and complications related to his cancer and therapy.  Zola Button, MD 03/25/2020 2:34 PM

## 2020-03-26 LAB — FERRITIN: Ferritin: 28 ng/mL (ref 24–336)

## 2020-03-26 LAB — IRON AND TIBC
Iron: 43 ug/dL (ref 42–163)
Saturation Ratios: 12 % — ABNORMAL LOW (ref 20–55)
TIBC: 355 ug/dL (ref 202–409)
UIBC: 312 ug/dL (ref 117–376)

## 2020-04-25 NOTE — Progress Notes (Signed)
Electrophysiology Office Note Date: 04/26/2020  ID:  Keith [name], DOB [birth year], MRN [id number]  PCP: Matilde Haymaker, MD Primary Cardiologist: No primary care provider on file. Electrophysiologist: [name], MD   CC: ILR follow-up   Keith [name] is a 80 y.o. male PAF, CVA, colon cancer, h/o GI bleed x 1, and CKD IV who is seen today for Dr. Rayann Heman. he presents today for routine follow up and questions about eliqus.  Since last being seen in our clinic, the patient reports doing very well. He saw Dr. Marcello Moores and was worried about proceeding with surgery for his colon cancer, so held off. He has remained off Eliquis all this time, as multiple notes cite plan to re-visit most bowel resection. he denies chest pain, palpitations, dyspnea, PND, orthopnea, nausea, vomiting, dizziness, syncope, edema, weight gain, or early satiety.  Device History: Medtronic loop recorder implanted 11/2016 for Cryptogenic Stroke; Monitoring discontinued 12/2019  Past Medical History:  Diagnosis Date  . Cataract    planning surgery  . Chronic kidney disease    "weak Kidneys"  . Complication of anesthesia    patient expressed that he has difficulty post anesthesia with swallowing, chewing, and talking after surgery  . GERD (gastroesophageal reflux disease)    tums prn  . Glaucoma   . History of hiatal hernia   . Hypothyroidism   . Hypothyroidism, postsurgical 11/20/2016  . 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 Melanoma ([place])    scalp  . Melanoma of scalp ([place]) 04/23/2017  . Obstructive uropathy   . PONV (postoperative nausea and vomiting) 03/20/2017  . Stroke ([place]) 11/2016   speech and right side at time of stroke 04/20/17- all function return to normal  . Underweight 01/24/2018   Past Surgical History:  Procedure Laterality Date  . APPLICATION OF A-CELL OF HEAD/NECK N/A 04/23/2017   Procedure: APPLICATION OF INTREGRA;  Surgeon: Irene Limbo, MD;  Location: [place];  Service:  Plastics;  Laterality: N/A;  . BIOPSY  05/10/2019   Procedure: BIOPSY;  Surgeon: [name], MD;  Location: [facility] ENDOSCOPY;  Service: Endoscopy;;  . CATARACT EXTRACTION    . COLONOSCOPY WITH PROPOFOL N/A 05/10/2019   Procedure: COLONOSCOPY WITH PROPOFOL;  Surgeon: [name], MD;  Location: [facility] ENDOSCOPY;  Service: Endoscopy;  Laterality: N/A;  . ESOPHAGOGASTRODUODENOSCOPY (EGD) WITH PROPOFOL N/A 05/10/2019   Procedure: ESOPHAGOGASTRODUODENOSCOPY (EGD) WITH PROPOFOL;  Surgeon: [name], MD;  Location: [facility] ENDOSCOPY;  Service: Endoscopy;  Laterality: N/A;  . EXCISION MASS HEAD N/A 03/20/2017   Procedure: EXCISION scalp lesion;  Surgeon: Irene Limbo, MD;  Location: [place];  Service: Plastics;  Laterality: N/A;  EXCISION scalp lesion  . EYE SURGERY    . IR ANGIO INTRA EXTRACRAN SEL COM CAROTID INNOMINATE BILAT MOD SED  11/23/2016  . IR ANGIO VERTEBRAL SEL VERTEBRAL UNI L MOD SED  11/23/2016  . LOOP RECORDER INSERTION N/A 11/23/2016   Procedure: LOOP RECORDER INSERTION;  Surgeon: [name], MD;  Location: [place] CV LAB;  Service: Cardiovascular;  Laterality: N/A;  . MASS EXCISION N/A 04/23/2017   Procedure: EXCISION MALIGNANT LESION OF SCALP;  Surgeon: Irene Limbo, MD;  Location: [place];  Service: Plastics;  Laterality: N/A;  . MELANOMA EXCISION  04/23/2017   BILATERAL SUPERFICIAL PAROTIDECTOMY/NECK SENTINEL NODE BIOPSY WITH FROZEN SECTION; LEFT SELECTIVE NECK DISSECTION  . PAROTIDECTOMY Bilateral 04/23/2017   Procedure: BILATERAL SUPERFICIAL PAROTIDECTOMY/NECK SENTINEL NODE BIOPSY WITH FROZEN SECTION; LEFT SELECTIVE NECK DISSECTION;  Surgeon: [name], MD;  Location: [facility] OR;  Service: ENT;  Laterality: Bilateral;  . POLYPECTOMY  05/10/2019   Procedure: POLYPECTOMY;  Surgeon: [name], MD;  Location: [place];  Service: Endoscopy;;  . SKIN GRAFT SPLIT THICKNESS HEAD / NECK  06/04/2017   SKIN GRAFT SPLIT THICKNESS TO SCALP FROM RIGHT OR LEFT THIGH POSSIBLE A CELL TO DONOR  SITE  . SKIN SPLIT GRAFT N/A 06/04/2017   Procedure: SKIN GRAFT SPLIT THICKNESS TO SCALP FROM RIGHT OR LEFT THIGH POSSIBLE A CELL TO DONOR SITE;  Surgeon: Irene Limbo, MD;  Location: [place];  Service: Plastics;  Laterality: N/A;  . SUBMUCOSAL TATTOO INJECTION  05/10/2019   Procedure: SUBMUCOSAL TATTOO INJECTION;  Surgeon: [name], MD;  Location: [facility] ENDOSCOPY;  Service: Endoscopy;;  . THYROIDECTOMY    . TONSILLECTOMY      Current Outpatient Medications  Medication Sig Dispense Refill  . acetaminophen (TYLENOL) 500 MG tablet Take 1 tablet (500 mg total) by mouth every 6 (six) hours as needed. 30 tablet 0  . apixaban (ELIQUIS) 2.5 MG TABS tablet Take 1 tablet (2.5 mg total) by mouth 2 (two) times daily. 180 tablet 1  . atorvastatin (LIPITOR) 40 MG tablet TAKE 1 TABLET (40 MG TOTAL) BY MOUTH DAILY AT 6 PM. 90 tablet 1  . cholecalciferol (VITAMIN D3) 25 MCG (1000 UNIT) tablet Take 1,000 Units by mouth daily.    . Dapsone 5 % topical gel Apply 1 application topically 2 (two) times daily.     . famotidine (PEPCID) 20 MG tablet TAKE 1 TABLET BY MOUTH TWICE A DAY 180 tablet 1  . ferrous sulfate 325 (65 FE) MG tablet Take 325 mg by mouth daily.     . Multiple Vitamin (MULTIVITAMIN) capsule Take 1 capsule by mouth daily.     . timolol (TIMOPTIC) 0.5 % ophthalmic solution Place 1 drop into both eyes 2 (two) times daily.   3  . traMADol (ULTRAM) 50 MG tablet Take 1 tablet (50 mg total) by mouth every 12 (twelve) hours as needed. 15 tablet 0   No current facility-administered medications for this visit.   Facility-Administered Medications Ordered in Other Visits  Medication Dose Route Frequency Provider Last Rate Last Admin  . clindamycin (CLEOCIN) 900 mg in dextrose 5 % 50 mL IVPB  900 mg Intravenous 60 min Pre-Op Leighton Ruff, MD       And  . gentamicin (GARAMYCIN) 260 mg in dextrose 5 % 50 mL IVPB  5 mg/kg Intravenous 60 min Pre-Op Leighton Ruff, MD        Allergies:   Penicillins    Social History: Social History   Socioeconomic History  . Marital status: Widowed    Spouse name: Not on file  . Number of children: 1  . Years of education: 73  . Highest education level: 12th grade  Occupational History  . Occupation: laborer    Comment: retired  Tobacco Use  . Smoking status: Former Smoker    Packs/day: 2.00    Years: 32.00    Pack years: 64.00    Types: Cigarettes    Quit date: 1983    Years since quitting: 39.3  . Smokeless tobacco: Never Used  . Tobacco comment: No plans to start  Vaping Use  . Vaping Use: Never used  Substance and Sexual Activity  . Alcohol use: No  . Drug use: No  . Sexual activity: Not Currently  Other Topics Concern  . Not on file  Social History Narrative   Patient lives  alone in [place].    Patient has one son and a daughter in law, [name]. Patient is close with them.    Patient has a dog named "boo boo bear." Great joy for him.   Patient enjoys spending time with his family and walking his dog.          Social Determinants of Health   Financial Resource Strain: Low Risk   . Difficulty of Paying Living Expenses: Not hard at all  Food Insecurity: No Food Insecurity  . Worried About Charity fundraiser in the Last Year: Never true  . Ran Out of Food in the Last Year: Never true  Transportation Needs: No Transportation Needs  . Lack of Transportation (Medical): No  . Lack of Transportation (Non-Medical): No  Physical Activity: Sufficiently Active  . Days of Exercise per Week: 7 days  . Minutes of Exercise per Session: 60 min  Stress: No Stress Concern Present  . Feeling of Stress : Only a [place]  Social Connections: Moderately Isolated  . Frequency of Communication with Friends and Family: More than three times a week  . Frequency of Social Gatherings with Friends and Family: More than three times a week  . Attends Religious Services: More than 4 times per year  . Active Member of Clubs or Organizations: No  .  Attends Archivist Meetings: Never  . Marital Status: Widowed  Intimate Partner Violence: Not At Risk  . Fear of Current or Ex-Partner: No  . Emotionally Abused: No  . Physically Abused: No  . Sexually Abused: No    Family History: Family History  Problem Relation Age of Onset  . Lung cancer Father   . Lung cancer Mother   . Diabetes Brother   . Colon cancer Neg Hx   . Colon polyps Neg Hx   . Esophageal cancer Neg Hx   . Stomach cancer Neg Hx   . Pancreatic cancer Neg Hx      Review of Systems: All other systems reviewed and are otherwise negative except as noted above.  Physical Exam: Vitals:   04/26/20 0907  BP: 140/60  Pulse: 62  SpO2: 99%  Weight: 114 lb 12.8 oz (52.1 kg)  Height: 5\' 9"  (1.753 m)     GEN- The patient is well appearing, alert and oriented x 3 today.   HEENT: normocephalic, atraumatic; sclera clear, conjunctiva pink; hearing intact; oropharynx clear; neck supple  Lungs- Clear to ausculation bilaterally, normal work of breathing.  No wheezes, rales, rhonchi Heart- Regular rate and rhythm, no murmurs, rubs or gallops  GI- soft, non-tender, non-distended, bowel sounds present  Extremities- no clubbing, cyanosis, or edema  MS- no significant deformity or atrophy Skin- warm and dry, no rash or lesion; PPM pocket well healed Psych- euthymic mood, full affect Neuro- strength and sensation are intact  PPM Interrogation- reviewed in detail today,  See PACEART report  EKG:  EKG is not ordered today. The ekg ordered today shows Sinus brady at 54 bpm  Recent Labs: 05/08/2019: TSH 3.525 05/10/2019: Magnesium 2.1 03/25/2020: ALT 7; BUN 25; Creatinine 2.38; Hemoglobin 10.3; Platelet Count 256; Potassium 4.6; Sodium 136   Wt Readings from Last 3 Encounters:  04/26/20 114 lb 12.8 oz (52.1 kg)  03/25/20 114 lb 9.6 oz (52 kg)  11/05/19 116 lb 2.9 oz (52.7 kg)     Other studies Reviewed: Additional studies/ records that were reviewed today  include: Echo 11/2016 shows LVEF 55-60%, Previous EP office notes, Previous remote  checks, Most recent labwork.   Assessment and Plan:  1.PAF  Cryptogenic Stroke s/p Medtronic Loop recorder -> PAF identified.  Monitoring has been discontinued as of 12/2019 as research period ended.  Has overall had low burden Resume Eliquis 2.5 mg BID (weight, creatinine) for CHA2DS2VASC of 4.   We discussed this at length today. With history of CVA, he and son would like him to go back on Eliquis for the time being. They remain unsure on colon resection, and were encouraged to make close follow up with Dr. Marcello Moores or her team.   2. CKD IV Creatinine baseline has been Mid 2s and low 3s.  3. Colon Cancer, tentative plans for robotic partial colectomy He has been recommended for surgery but has been unsure.  Echo 11/2016 with EF 55-60% With history of stroke, intraperitoneal surgery, and severe kidney diseae, pt is at high risk (>11%) of perioperative cardiac complications including but not limited to myocardial infarction, pulmonary edema, ventricular fibrillation or primary cardiac arrest, and complete heart block by Revised Cardiac Index [place] Criteria).  He understands this and will continue to take it into consideration while considering surgery. He has been recommended for surgery by oncology and general surgery.  Current medicines are reviewed at length with the patient today.   The patient does have concerns regarding his medicines. He would like to be restarted on Eliquis. The following changes were made today:  Resume eliquis 2.5 mg BID. Stop ASA.   Labs/ tests ordered today include:  Orders Placed This Encounter  Procedures  . Basic metabolic panel  . CBC  . EKG 12-Lead    Disposition:   Follow up with EP APP in 2 Weeks for recheck on Eliquis.     Jacalyn Lefevre, PA-C  04/26/2020 9:37 AM  [place] [address] [place]  [id number] [phone] (office) [phone] (fax)

## 2020-04-26 ENCOUNTER — Ambulatory Visit (INDEPENDENT_AMBULATORY_CARE_PROVIDER_SITE_OTHER): Payer: Medicare Other | Admitting: Student

## 2020-04-26 ENCOUNTER — Encounter: Payer: Self-pay | Admitting: Student

## 2020-04-26 ENCOUNTER — Other Ambulatory Visit: Payer: Self-pay

## 2020-04-26 VITALS — BP 140/60 | HR 62 | Ht 69.0 in | Wt 114.8 lb

## 2020-04-26 DIAGNOSIS — K6389 Other specified diseases of intestine: Secondary | ICD-10-CM

## 2020-04-26 DIAGNOSIS — N184 Chronic kidney disease, stage 4 (severe): Secondary | ICD-10-CM | POA: Diagnosis not present

## 2020-04-26 DIAGNOSIS — I63232 Cerebral infarction due to unspecified occlusion or stenosis of left carotid arteries: Secondary | ICD-10-CM | POA: Diagnosis not present

## 2020-04-26 DIAGNOSIS — I480 Paroxysmal atrial fibrillation: Secondary | ICD-10-CM | POA: Diagnosis not present

## 2020-04-26 LAB — CBC
Hematocrit: 29.9 % — ABNORMAL LOW (ref 37.5–51.0)
Hemoglobin: 9.6 g/dL — ABNORMAL LOW (ref 13.0–17.7)
MCH: 28.2 pg (ref 26.6–33.0)
MCHC: 32.1 g/dL (ref 31.5–35.7)
MCV: 88 fL (ref 79–97)
Platelets: 220 10*3/uL (ref 150–450)
RBC: 3.41 x10E6/uL — ABNORMAL LOW (ref 4.14–5.80)
RDW: 13.1 % (ref 11.6–15.4)
WBC: 5 10*3/uL (ref 3.4–10.8)

## 2020-04-26 LAB — BASIC METABOLIC PANEL
BUN/Creatinine Ratio: 9 — ABNORMAL LOW (ref 10–24)
BUN: 23 mg/dL (ref 8–27)
CO2: 22 mmol/L (ref 20–29)
Calcium: 9 mg/dL (ref 8.6–10.2)
Chloride: 106 mmol/L (ref 96–106)
Creatinine, Ser: 2.49 mg/dL — ABNORMAL HIGH (ref 0.76–1.27)
Glucose: 93 mg/dL (ref 65–99)
Potassium: 4.7 mmol/L (ref 3.5–5.2)
Sodium: 141 mmol/L (ref 134–144)
eGFR: 26 mL/min/{1.73_m2} — ABNORMAL LOW (ref >59)

## 2020-04-26 MED ORDER — APIXABAN 2.5 MG PO TABS: 2.5000 mg | ORAL_TABLET | Freq: Two times a day (BID) | ORAL | 1 refills | Status: DC

## 2020-04-26 NOTE — Patient Instructions (Signed)
Medication Instructions:  Your physician has recommended you make the following change in your medication:   STOP: Aspirin START: Eliquis 2.5mg  twice daily  *If you need a refill on your cardiac medications before your next appointment, please call your pharmacy*   Lab Work: TODAY: BMET, CBC   If you have labs (blood work) drawn today and your tests are completely normal, you will receive your results only by: Marland Kitchen MyChart Message (if you have MyChart) OR . A paper copy in the mail If you have any lab test that is abnormal or we need to change your treatment, we will call you to review the results.  Follow-Up: At [facility], you and your health needs are our priority.  As part of our continuing mission to provide you with exceptional heart care, we have created designated Provider Care Teams.  These Care Teams include your primary Cardiologist (physician) and Advanced Practice Providers (APPs -  Physician Assistants and Nurse Practitioners) who all work together to provide you with the care you need, when you need it.  Your next appointment:   As scheduled with Oda Kilts, PA

## 2020-05-06 NOTE — Progress Notes (Signed)
Electrophysiology Office Note Date: 05/10/2020  ID:  Keith [name], DOB 02-12-1940, MRN [id number]  PCP: Matilde Haymaker, MD Primary Cardiologist: No primary care provider on file. Electrophysiologist: [name], MD   CC: ILR follow-up   Keith [name] is a 80 y.o. male PAF, CVA, colon cancer, h/o GI bleed x 1, and CKD IV who is seen today for Dr. Rayann Heman.  He was seen 2 weeks ago and Eliquis resumed after long discussion and shared decision making.  Today he is doing well.  He has follow up next month with Dr. Marcello Moores to consider surgery for his colon cancer. He denies symptoms of palpitations, chest pain, shortness of breath, orthopnea, PND, lower extremity edema, claudication, dizziness, presyncope, syncope, bleeding, or neurologic sequela. The patient is tolerating medications without difficulties.    Device History: Medtronic loop recorder implanted 11/2016 for Cryptogenic Stroke; Monitoring discontinued 12/2019  Past Medical History:  Diagnosis Date  . Cataract    planning surgery  . Chronic kidney disease    "weak Kidneys"  . Complication of anesthesia    patient expressed that he has difficulty post anesthesia with swallowing, chewing, and talking after surgery  . GERD (gastroesophageal reflux disease)    tums prn  . Glaucoma   . History of hiatal hernia   . Hypothyroidism   . Hypothyroidism, postsurgical 11/20/2016  . Left middle cerebral artery stroke ([place]) 11/23/2016  . Melanoma ([place])    scalp  . Melanoma of scalp ([place]) 04/23/2017  . Obstructive uropathy   . PONV (postoperative nausea and vomiting) 03/20/2017  . Stroke ([place]) 11/2016   speech and right side at time of stroke 04/20/17- all function return to normal  . Underweight 01/24/2018   Past Surgical History:  Procedure Laterality Date  . APPLICATION OF A-CELL OF HEAD/NECK N/A 04/23/2017   Procedure: APPLICATION OF INTREGRA;  Surgeon: Irene Limbo, MD;  Location: [facility];  Service: Plastics;  Laterality: N/A;   . BIOPSY  05/10/2019   Procedure: BIOPSY;  Surgeon: [name], MD;  Location: [facility] ENDOSCOPY;  Service: Endoscopy;;  . CATARACT EXTRACTION    . COLONOSCOPY WITH PROPOFOL N/A 05/10/2019   Procedure: COLONOSCOPY WITH PROPOFOL;  Surgeon: [name], MD;  Location: [facility] ENDOSCOPY;  Service: Endoscopy;  Laterality: N/A;  . ESOPHAGOGASTRODUODENOSCOPY (EGD) WITH PROPOFOL N/A 05/10/2019   Procedure: ESOPHAGOGASTRODUODENOSCOPY (EGD) WITH PROPOFOL;  Surgeon: [name], MD;  Location: [facility] ENDOSCOPY;  Service: Endoscopy;  Laterality: N/A;  . EXCISION MASS HEAD N/A 03/20/2017   Procedure: EXCISION scalp lesion;  Surgeon: Irene Limbo, MD;  Location: [place];  Service: Plastics;  Laterality: N/A;  EXCISION scalp lesion  . EYE SURGERY    . IR ANGIO INTRA EXTRACRAN SEL COM CAROTID INNOMINATE BILAT MOD SED  11/23/2016  . IR ANGIO VERTEBRAL SEL VERTEBRAL UNI L MOD SED  11/23/2016  . LOOP RECORDER INSERTION N/A 11/23/2016   Procedure: LOOP RECORDER INSERTION;  Surgeon: [name], MD;  Location: [facility];  Service: Cardiovascular;  Laterality: N/A;  . MASS EXCISION N/A 04/23/2017   Procedure: EXCISION MALIGNANT LESION OF SCALP;  Surgeon: Irene Limbo, MD;  Location: [place];  Service: Plastics;  Laterality: N/A;  . MELANOMA EXCISION  04/23/2017   BILATERAL SUPERFICIAL PAROTIDECTOMY/NECK SENTINEL NODE BIOPSY WITH FROZEN SECTION; LEFT SELECTIVE NECK DISSECTION  . PAROTIDECTOMY Bilateral 04/23/2017   Procedure: BILATERAL SUPERFICIAL PAROTIDECTOMY/NECK SENTINEL NODE BIOPSY WITH FROZEN SECTION; LEFT SELECTIVE NECK DISSECTION;  Surgeon: [name], MD;  Location: [place];  Service: ENT;  Laterality: Bilateral;  . POLYPECTOMY  05/10/2019   Procedure: POLYPECTOMY;  Surgeon: [name], MD;  Location: [place];  Service: Endoscopy;;  . SKIN GRAFT SPLIT THICKNESS HEAD / NECK  06/04/2017   SKIN GRAFT SPLIT THICKNESS TO SCALP FROM RIGHT OR LEFT THIGH POSSIBLE A CELL TO DONOR SITE  . SKIN SPLIT GRAFT  N/A 06/04/2017   Procedure: SKIN GRAFT SPLIT THICKNESS TO SCALP FROM RIGHT OR LEFT THIGH POSSIBLE A CELL TO DONOR SITE;  Surgeon: Irene Limbo, MD;  Location: [place];  Service: Plastics;  Laterality: N/A;  . SUBMUCOSAL TATTOO INJECTION  05/10/2019   Procedure: SUBMUCOSAL TATTOO INJECTION;  Surgeon: [name], MD;  Location: [facility] ENDOSCOPY;  Service: Endoscopy;;  . THYROIDECTOMY    . TONSILLECTOMY      Current Outpatient Medications  Medication Sig Dispense Refill  . acetaminophen (TYLENOL) 500 MG tablet Take 1 tablet (500 mg total) by mouth every 6 (six) hours as needed. 30 tablet 0  . apixaban (ELIQUIS) 2.5 MG TABS tablet Take 1 tablet (2.5 mg total) by mouth 2 (two) times daily. 180 tablet 1  . atorvastatin (LIPITOR) 40 MG tablet TAKE 1 TABLET (40 MG TOTAL) BY MOUTH DAILY AT 6 PM. 90 tablet 1  . cholecalciferol (VITAMIN D3) 25 MCG (1000 UNIT) tablet Take 1,000 Units by mouth daily.    . Dapsone 5 % topical gel Apply 1 application topically 2 (two) times daily.     . famotidine (PEPCID) 20 MG tablet TAKE 1 TABLET BY MOUTH TWICE A DAY 180 tablet 1  . ferrous sulfate 325 (65 FE) MG tablet Take 325 mg by mouth daily.     . Multiple Vitamin (MULTIVITAMIN) capsule Take 1 capsule by mouth daily.     . timolol (TIMOPTIC) 0.5 % ophthalmic solution Place 1 drop into both eyes 2 (two) times daily.   3   No current facility-administered medications for this visit.   Facility-Administered Medications Ordered in Other Visits  Medication Dose Route Frequency Provider Last Rate Last Admin  . clindamycin (CLEOCIN) 900 mg in dextrose 5 % 50 mL IVPB  900 mg Intravenous 60 min Pre-Op Leighton Ruff, MD       And  . gentamicin (GARAMYCIN) 260 mg in dextrose 5 % 50 mL IVPB  5 mg/kg Intravenous 60 min Pre-Op Leighton Ruff, MD        Allergies:   Penicillins   Social History: Social History   Socioeconomic History  . Marital status: Widowed    Spouse name: Not on file  . Number of children: 1  .  Years of education: 74  . Highest education level: 12th grade  Occupational History  . Occupation: laborer    Comment: retired  Tobacco Use  . Smoking status: Former Smoker    Packs/day: 2.00    Years: 32.00    Pack years: 64.00    Types: Cigarettes    Quit date: 1983    Years since quitting: 39.3  . Smokeless tobacco: Never Used  . Tobacco comment: No plans to start  Vaping Use  . Vaping Use: Never used  Substance and Sexual Activity  . Alcohol use: No  . Drug use: No  . Sexual activity: Not Currently  Other Topics Concern  . Not on file  Social History Narrative   Patient lives alone in [place].    Patient has one son and a daughter in law, [name]. Patient is close with them.    Patient has a dog named "boo boo  bear." Great joy for him.   Patient enjoys spending time with his family and walking his dog.          Social Determinants of Health   Financial Resource Strain: Low Risk   . Difficulty of Paying Living Expenses: Not hard at all  Food Insecurity: No Food Insecurity  . Worried About Charity fundraiser in the Last Year: Never true  . Ran Out of Food in the Last Year: Never true  Transportation Needs: No Transportation Needs  . Lack of Transportation (Medical): No  . Lack of Transportation (Non-Medical): No  Physical Activity: Sufficiently Active  . Days of Exercise per Week: 7 days  . Minutes of Exercise per Session: 60 min  Stress: No Stress Concern Present  . Feeling of Stress : Only a [place]  Social Connections: Moderately Isolated  . Frequency of Communication with Friends and Family: More than three times a week  . Frequency of Social Gatherings with Friends and Family: More than three times a week  . Attends Religious Services: More than 4 times per year  . Active Member of Clubs or Organizations: No  . Attends Archivist Meetings: Never  . Marital Status: Widowed  Intimate Partner Violence: Not At Risk  . Fear of Current or Ex-Partner:  No  . Emotionally Abused: No  . Physically Abused: No  . Sexually Abused: No    Family History: Family History  Problem Relation Age of Onset  . Lung cancer Father   . Lung cancer Mother   . Diabetes Brother   . Colon cancer Neg Hx   . Colon polyps Neg Hx   . Esophageal cancer Neg Hx   . Stomach cancer Neg Hx   . Pancreatic cancer Neg Hx      Review of Systems: All other systems reviewed and are otherwise negative except as noted above.  Physical Exam: Vitals:   05/10/20 0901  BP: (!) 120/50  Pulse: (!) 53  SpO2: 99%  Weight: 113 lb (51.3 kg)  Height: 5\' 9"  (1.753 m)     GEN- The patient is well appearing, alert and oriented x 3 today.   HEENT: normocephalic, atraumatic; sclera clear, conjunctiva pink; hearing intact; oropharynx clear; neck supple  Lungs- Clear to ausculation bilaterally, normal work of breathing.  No wheezes, rales, rhonchi Heart- Regular rate and rhythm, no murmurs, rubs or gallops  GI- soft, non-tender, non-distended, bowel sounds present  Extremities- no clubbing, cyanosis, or edema  MS- no significant deformity or atrophy Skin- warm and dry, no rash or lesion; PPM pocket well healed Psych- euthymic mood, full affect Neuro- strength and sensation are intact  PPM Interrogation- reviewed in detail today,  See PACEART report  EKG:  EKG is not ordered today. The ekg ordered today shows Sinus brady at 54 bpm  Recent Labs: 03/25/2020: ALT 7 04/26/2020: BUN 23; Creatinine, Ser 2.49; Hemoglobin 9.6; Platelets 220; Potassium 4.7; Sodium 141   Wt Readings from Last 3 Encounters:  05/10/20 113 lb (51.3 kg)  04/26/20 114 lb 12.8 oz (52.1 kg)  03/25/20 114 lb 9.6 oz (52 kg)     Other studies Reviewed: Additional studies/ records that were reviewed today include: Echo 11/2016 shows LVEF 55-60%, Previous EP office notes, Previous remote checks, Most recent labwork.   Assessment and Plan:  1.PAF  Cryptogenic Stroke s/p Medtronic Loop recorder -> PAF  identified.  Monitoring has been discontinued as of 12/2019 as research period ended.  Has overall had low  burden Continue Eliquis 2.5 mg BID (weight, creatinine) for CHA2DS2VASC of 4.  Repeat labs today.   2. CKD IV Creatinine baseline has been Mid 2s and low 3s. 2.49 04/26/20. Repeat today.    3. Colon Cancer, tentative plans for robotic partial colectomy He has been recommended for surgery but has been unsure.  Echo 11/2016 with EF 55-60% With history of stroke, intraperitoneal surgery, and severe kidney diseae, pt is at high risk (>11%) of perioperative cardiac complications including but not limited to myocardial infarction, pulmonary edema, ventricular fibrillation or primary cardiac arrest, and complete heart block by Revised Cardiac Index Truman Hayward Criteria).  He understands this and will continue to take it into consideration while considering surgery. He has been recommended for surgery by oncology and general surgery. He has follow up next month to discuss further.   Current medicines are reviewed at length with the patient today.   The patient does not have have concerns regarding his medicines. No changes made today.   Labs/ tests ordered today include:  Orders Placed This Encounter  Procedures  . Basic metabolic panel  . CBC    Disposition:   Follow up with EP APP in 4 months.   Jacalyn Lefevre, PA-C  05/10/2020 10:08 AM  [facility] [address] [place]  [address] [phone] (office) [phone] (fax)

## 2020-05-10 ENCOUNTER — Encounter: Payer: Self-pay | Admitting: Student

## 2020-05-10 ENCOUNTER — Other Ambulatory Visit: Payer: Self-pay

## 2020-05-10 ENCOUNTER — Ambulatory Visit (INDEPENDENT_AMBULATORY_CARE_PROVIDER_SITE_OTHER): Payer: Medicare Other | Admitting: Student

## 2020-05-10 VITALS — BP 120/50 | HR 53 | Ht 69.0 in | Wt 113.0 lb

## 2020-05-10 DIAGNOSIS — I480 Paroxysmal atrial fibrillation: Secondary | ICD-10-CM

## 2020-05-10 DIAGNOSIS — K6389 Other specified diseases of intestine: Secondary | ICD-10-CM | POA: Diagnosis not present

## 2020-05-10 DIAGNOSIS — N184 Chronic kidney disease, stage 4 (severe): Secondary | ICD-10-CM | POA: Diagnosis not present

## 2020-05-10 DIAGNOSIS — I63232 Cerebral infarction due to unspecified occlusion or stenosis of left carotid arteries: Secondary | ICD-10-CM | POA: Diagnosis not present

## 2020-05-10 LAB — BASIC METABOLIC PANEL
BUN/Creatinine Ratio: 9 — ABNORMAL LOW (ref 10–24)
BUN: 23 mg/dL (ref 8–27)
CO2: 22 mmol/L (ref 20–29)
Calcium: 9.1 mg/dL (ref 8.6–10.2)
Chloride: 105 mmol/L (ref 96–106)
Creatinine, Ser: 2.55 mg/dL — ABNORMAL HIGH (ref 0.76–1.27)
Glucose: 89 mg/dL (ref 65–99)
Potassium: 5 mmol/L (ref 3.5–5.2)
Sodium: 139 mmol/L (ref 134–144)
eGFR: 25 mL/min/{1.73_m2} — ABNORMAL LOW (ref >59)

## 2020-05-10 LAB — CBC
Hematocrit: 32.5 % — ABNORMAL LOW (ref 37.5–51.0)
Hemoglobin: 10.4 g/dL — ABNORMAL LOW (ref 13.0–17.7)
MCH: 28 pg (ref 26.6–33.0)
MCHC: 32 g/dL (ref 31.5–35.7)
MCV: 87 fL (ref 79–97)
Platelets: 208 10*3/uL (ref 150–450)
RBC: 3.72 x10E6/uL — ABNORMAL LOW (ref 4.14–5.80)
RDW: 13.7 % (ref 11.6–15.4)
WBC: 4.9 10*3/uL (ref 3.4–10.8)

## 2020-05-10 NOTE — Patient Instructions (Signed)
Medication Instructions:  Your physician recommends that you continue on your current medications as directed. Please refer to the Current Medication list given to you today.  *If you need a refill on your cardiac medications before your next appointment, please call your pharmacy*   Lab Work: TODAY: BMET, CBC  If you have labs (blood work) drawn today and your tests are completely normal, you will receive your results only by: Marland Kitchen MyChart Message (if you have MyChart) OR . A paper copy in the mail If you have any lab test that is abnormal or we need to change your treatment, we will call you to review the results.   Follow-Up: At [facility], you and your health needs are our priority.  As part of our continuing mission to provide you with exceptional heart care, we have created designated Provider Care Teams.  These Care Teams include your primary Cardiologist (physician) and Advanced Practice Providers (APPs -  Physician Assistants and Nurse Practitioners) who all work together to provide you with the care you need, when you need it.  Your next appointment:   4 month(s)  The format for your next appointment:   In Person  Provider:   Legrand Como "Oda Kilts, PA-C

## 2020-05-17 ENCOUNTER — Other Ambulatory Visit: Payer: Self-pay | Admitting: Internal Medicine

## 2020-05-25 ENCOUNTER — Ambulatory Visit: Payer: Self-pay | Admitting: General Surgery

## 2020-05-25 ENCOUNTER — Telehealth: Payer: Self-pay | Admitting: *Deleted

## 2020-05-25 DIAGNOSIS — I2584 Coronary atherosclerosis due to calcified coronary lesion: Secondary | ICD-10-CM

## 2020-05-25 DIAGNOSIS — C184 Malignant neoplasm of transverse colon: Secondary | ICD-10-CM | POA: Diagnosis not present

## 2020-05-25 DIAGNOSIS — I2510 Atherosclerotic heart disease of native coronary artery without angina pectoris: Secondary | ICD-10-CM | POA: Insufficient documentation

## 2020-05-25 NOTE — Telephone Encounter (Signed)
   [facility] Pre-operative Risk Assessment    Patient Name: Prayan Ulin Weidmann  DOB: 01/09/1941  MRN: [id number]   HEARTCARE STAFF: - Please ensure there is not already an duplicate clearance open for this procedure. - Under Visit Info/Reason for Call, type in Other and utilize the format Clearance MM/DD/YY or Clearance TBD. Do not use dashes or single digits. - If request is for dental extraction, please clarify the # of teeth to be extracted.  Request for surgical clearance:  1. What type of surgery is being performed? ROBOTIC PARTIAL COLECTOMY    2. When is this surgery scheduled? TBD   3. What type of clearance is required (medical clearance vs. Pharmacy clearance to hold med vs. Both)? BOTH  4. Are there any medications that need to be held prior to surgery and how long? ELIQUIS   5. Practice name and name of physician performing surgery?  [facility]; DR. Leighton Ruff   6. What is the office phone number? [phone]   7.   What is the office fax number? [place]: Mammie Lorenzo, LPN  8.   Anesthesia type (None, local, MAC, general) ? GENERAL   Julaine Hua 05/25/2020, 1:10 PM  _________________________________________________________________   (provider comments below)

## 2020-05-25 NOTE — Telephone Encounter (Signed)
Pharmacy, can you please comment on how long Eliquis can be held for upcoming surgery?  Thank you! 

## 2020-05-25 NOTE — Telephone Encounter (Addendum)
Patient with diagnosis of afib on Eliquis for anticoagulation.    Procedure: robotic partial colectomy Date of procedure: TBD  CHA2DS2-VASc Score = 5  This indicates a 7.2% annual risk of stroke. The patient's score is based upon: CHF History: No HTN History: No Diabetes History: No Stroke History: Yes Vascular Disease History: Yes Age Score: 2 Gender Score: 0  CrCl 79mL/min Platelet count 208K  Pt is at elevated CV risk given prior stroke in 2018, however also at higher bleed risk given CKD and prior GI bleed (had been off Eliquis for a long time given this - was on hold as of 06/06/19 note and not resumed until 04/26/20). Eliquis will likely need 2-3 day hold - will route to MD for input given elevated CV risk. Would not recommend Lovenox bridge with his low CrCl/body weight and prior GI bleed.

## 2020-05-25 NOTE — H&P (Signed)
The patient is a 80 year old male who presents with colorectal cancer. 80 year old male who presents to the office today for ongoing discussion of his synchronous colon cancers. He was hospitalized in April of 2021 due to severe anemia. He was transfused and noted to have iron deficiency anemia. Colonoscopy and EGD were performed. Colonoscopy showed a distal transverse colon mass which was biopsied and sigmoid colon polyp which was removed. Both lesions were tattooed. Both lesions were noted to have adenocarcinoma on final pathology. CT scans of the chest, abdomen and pelvis showed no signs of metastatic disease. CEA level was normal. Patient has no past surgical history. He has a history of a CVA in 2017 with no residual weakness. He has chronic kidney disease with a baseline creatinine around 2-3. Patient has a history of atrial fibrillation, but is currently in sinus rhythm according to him. He is taking low dose Elliquis for his stroke. I saw him back in May and Sept and Jan and recommended colectomy. I think it is reasonable to proceed with right colectomy at this time and continue to monitor his sigmoid colon. His cardiologist has evaluated him in the past and deemed him high risk with approximately 11% risk of perioperative cardiac complications. He is scheduled to undergo a follow-up CT scan on June 9, and meet with Dr. Alen Blew the following week.   Problem List/Past Medical Leighton Ruff, MD; 07/02/735 10:59 AM) MALIGNANT NEOPLASM OF SIGMOID COLON (C18.7) MALIGNANT NEOPLASM OF TRANSVERSE COLON (C18.4)  Past Surgical History Leighton Ruff, MD; 01/22/2692 10:59 AM) Cataract Surgery Bilateral. Colon Polyp Removal - Colonoscopy Sentinel Lymph Node Biopsy  Diagnostic Studies History Leighton Ruff, MD; 8/54/6270 10:59 AM) Colonoscopy within last year  Allergies Janeann Forehand, CNA; 05/25/2020 10:43 AM) Penicillins Allergies Reconciled  Medication History Leighton Ruff, MD; 3/50/0938 10:59 AM) Atorvastatin Calcium (40MG  Tablet, Oral) Active. Timolol Maleate (0.25% Solution, Ophthalmic) Active. Vitamin D (Cholecalciferol) (Oral) Specific strength unknown - Active. Iron (325 (65 Fe)MG Tablet, Oral) Active. Multi-Vitamin (Oral) Active. Aczone (7.5% Gel, External) Active. Dapsone (5% Gel, External) Active. Triamcinolone Acetonide (Top) (0.1% Cream, External) Active. Medications Reconciled Neomycin Sulfate (500MG  Tablet, 2 (two) Oral SEE NOTE, Taken starting 01/20/2020) Active. (TAKE TWO TABLETS AT 2 PM, 3 PM, AND 10 PM THE DAY PRIOR TO SURGERY) metroNIDAZOLE (500MG  Tablet, 2 (two) Oral three times daily, Taken starting 05/25/2020) Active. (Pharmacy Instructions: Take 2 tablets at 2pm, 3pm, and 10pm the day prior to your colon operation.)  Social History Leighton Ruff, MD; 1/82/9937 10:59 AM) Alcohol use Occasional alcohol use. Caffeine use Carbonated beverages, Coffee, Tea. No drug use Tobacco use Former smoker.  Family History Leighton Ruff, MD; 1/69/6789 10:59 AM) Diabetes Mellitus Mother. Heart Disease Father. Heart disease in male family member before age 73 Respiratory Condition Father, Mother.  Other Problems Leighton Ruff, MD; 3/81/0175 10:59 AM) Atrial Fibrillation Cerebrovascular Accident Chronic Renal Failure Syndrome Gastroesophageal Reflux Disease Melanoma     Review of Systems Leighton Ruff MD; 01/17/5850 10:59 AM) General Not Present- Appetite Loss, Chills, Fatigue, Fever, Night Sweats, Weight Gain and Weight Loss. Skin Present- Non-Healing Wounds. Not Present- Change in Wart/Mole, Dryness, Hives, Jaundice, New Lesions, Rash and Ulcer. HEENT Present- Hearing Loss. Not Present- Earache, Hoarseness, Nose Bleed, Oral Ulcers, Ringing in the Ears, Seasonal Allergies, Sinus Pain, Sore Throat, Visual Disturbances, Wears glasses/contact lenses and Yellow Eyes. Respiratory Not Present- Bloody sputum, Chronic  Cough, Difficulty Breathing, Snoring and Wheezing. Breast Not Present- Breast Mass, Breast Pain, Nipple Discharge and Skin Changes. Cardiovascular  Not Present- Chest Pain, Difficulty Breathing Lying Down, Leg Cramps, Palpitations, Rapid Heart Rate, Shortness of Breath and Swelling of Extremities. Gastrointestinal Not Present- Abdominal Pain, Bloating, Bloody Stool, Change in Bowel Habits, Chronic diarrhea, Constipation, Difficulty Swallowing, Excessive gas, Gets full quickly at meals, Hemorrhoids, Indigestion, Nausea, Rectal Pain and Vomiting. Male Genitourinary Not Present- Blood in Urine, Change in Urinary Stream, Frequency, Impotence, Nocturia, Painful Urination, Urgency and Urine Leakage. Musculoskeletal Not Present- Back Pain, Joint Pain, Joint Stiffness, Muscle Pain, Muscle Weakness and Swelling of Extremities. Neurological Not Present- Decreased Memory, Fainting, Headaches, Numbness, Seizures, Tingling, Tremor, Trouble walking and Weakness. Psychiatric Not Present- Anxiety, Bipolar, Change in Sleep Pattern, Depression, Fearful and Frequent crying. Endocrine Not Present- Cold Intolerance, Excessive Hunger, Hair Changes, Heat Intolerance and New Diabetes. Hematology Present- Easy Bruising. Not Present- Blood Thinners, Excessive bleeding, Gland problems, HIV and Persistent Infections.   Physical Exam Leighton Ruff MD; 2/44/0102 10:59 AM)  General Mental Status-Alert. General Appearance-Cooperative.  Abdomen Palpation/Percussion Palpation and Percussion of the abdomen reveal - Soft and Non Tender. Note: no masses noted.    Assessment & Plan Leighton Ruff MD; 08/10/3662 10:56 AM)  MALIGNANT NEOPLASM OF TRANSVERSE COLON (C18.4) Impression: 80 year old male with stage IV kidney disease and coronary artery disease. He has a proximal transverse colon cancer. He feels he is ready for surgery now. He is aware he is at high risk for coronary artery complications during and after surgery.  He will get a CT scan next month. I will evaluate this and as long as he is still resectable, we will proceed with robotic-assisted right colectomy. I have modified his bowel prep, to help avoid kidney injuries due to dehydration and we have recommended that he drink plenty of fluids during his prep. He did have a malignant polyp in the sigmoid colon resected. We have decided to just watch this for right now, given his frail condition. The surgery and anatomy were described to the patient as well as the risks of surgery and the possible complications. These include: Bleeding, deep abdominal infections and possible wound complications such as hernia and infection, damage to adjacent structures, leak of surgical connections, which can lead to other surgeries and possibly an ostomy, possible need for other procedures, such as abscess drains in radiology, possible prolonged hospital stay, possible diarrhea from removal of part of the colon, possible constipation from narcotics, possible bowel, bladder or sexual dysfunction if having rectal surgery, prolonged fatigue/weakness or appetite loss, possible early recurrence of of disease, possible complications of their medical problems such as heart disease or arrhythmias or lung problems, death (less than 1%). I believe the patient understands and wishes to proceed with the surgery.

## 2020-06-15 NOTE — Telephone Encounter (Signed)
   Name: Keith [name]  DOB: 1940-10-09  MRN: [id number]   Primary Cardiologist: [name], MD  Chart reviewed as part of pre-operative protocol coverage. Patient was contacted 06/15/2020 in reference to pre-operative risk assessment for pending surgery as outlined below.  Keith [name] was last seen on 05/10/20 by Oda Kilts, PA-C.  Since that day, Keith [name] has done fine from a cardiac standpoint. He can complete 4 METs without anginal complaints.   Therefore, based on ACC/AHA guidelines, the patient would be at acceptable risk for the planned procedure without further cardiovascular testing.   The patient was advised that if he develops new symptoms prior to surgery to contact our office to arrange for a follow-up visit, and he verbalized understanding.  Dr. Rayann Heman is on vacation this week, however was able to speak with Dr. Lovena Le in regards to his eliquis. Per Dr. Lovena Le, patient can hold eliquis up to 3 days prior to his upcoming procedure with plans to restart when cleared to do so by his surgeon. He was felt to be high risk for bleeding complications so bridging with lovenox was not recommended.   I will route this recommendation to the requesting party via Epic fax function and remove from pre-op pool. Please call with questions.  Abigail Butts, PA-C 06/15/2020, 12:13 PM

## 2020-06-24 ENCOUNTER — Other Ambulatory Visit: Payer: Self-pay

## 2020-06-24 ENCOUNTER — Inpatient Hospital Stay: Payer: Medicare Other | Attending: Oncology

## 2020-06-24 ENCOUNTER — Ambulatory Visit (HOSPITAL_COMMUNITY)
Admission: RE | Admit: 2020-06-24 | Discharge: 2020-06-24 | Disposition: A | Discharge status: Home / Self Care | Payer: Medicare Other | Source: Ambulatory Visit | Attending: Oncology | Admitting: Oncology

## 2020-06-24 DIAGNOSIS — C434 Malignant melanoma of scalp and neck: Secondary | ICD-10-CM | POA: Diagnosis not present

## 2020-06-24 DIAGNOSIS — D509 Iron deficiency anemia, unspecified: Secondary | ICD-10-CM | POA: Diagnosis not present

## 2020-06-24 DIAGNOSIS — I701 Atherosclerosis of renal artery: Secondary | ICD-10-CM | POA: Diagnosis not present

## 2020-06-24 DIAGNOSIS — Z79899 Other long term (current) drug therapy: Secondary | ICD-10-CM | POA: Insufficient documentation

## 2020-06-24 DIAGNOSIS — Z85038 Personal history of other malignant neoplasm of large intestine: Secondary | ICD-10-CM | POA: Diagnosis not present

## 2020-06-24 DIAGNOSIS — J984 Other disorders of lung: Secondary | ICD-10-CM | POA: Diagnosis not present

## 2020-06-24 DIAGNOSIS — D649 Anemia, unspecified: Secondary | ICD-10-CM

## 2020-06-24 DIAGNOSIS — Z7901 Long term (current) use of anticoagulants: Secondary | ICD-10-CM | POA: Diagnosis not present

## 2020-06-24 DIAGNOSIS — I2510 Atherosclerotic heart disease of native coronary artery without angina pectoris: Secondary | ICD-10-CM | POA: Diagnosis not present

## 2020-06-24 DIAGNOSIS — Z86010 Personal history of colonic polyps: Secondary | ICD-10-CM | POA: Diagnosis not present

## 2020-06-24 DIAGNOSIS — Z85820 Personal history of malignant melanoma of skin: Secondary | ICD-10-CM | POA: Insufficient documentation

## 2020-06-24 DIAGNOSIS — I722 Aneurysm of renal artery: Secondary | ICD-10-CM | POA: Diagnosis not present

## 2020-06-24 DIAGNOSIS — M419 Scoliosis, unspecified: Secondary | ICD-10-CM | POA: Diagnosis not present

## 2020-06-24 LAB — CBC WITH DIFFERENTIAL (CANCER CENTER ONLY)
Abs Immature Granulocytes: 0.02 10*3/uL (ref 0.00–0.07)
Basophils Absolute: 0 10*3/uL (ref 0.0–0.1)
Basophils Relative: 1 %
Eosinophils Absolute: 0.2 10*3/uL (ref 0.0–0.5)
Eosinophils Relative: 3 %
HCT: 33.3 % — ABNORMAL LOW (ref 39.0–52.0)
Hemoglobin: 10.4 g/dL — ABNORMAL LOW (ref 13.0–17.0)
Immature Granulocytes: 0 %
Lymphocytes Relative: 27 %
Lymphs Abs: 1.4 10*3/uL (ref 0.7–4.0)
MCH: 28.2 pg (ref 26.0–34.0)
MCHC: 31.2 g/dL (ref 30.0–36.0)
MCV: 90.2 fL (ref 80.0–100.0)
Monocytes Absolute: 0.5 10*3/uL (ref 0.1–1.0)
Monocytes Relative: 9 %
Neutro Abs: 3 10*3/uL (ref 1.7–7.7)
Neutrophils Relative %: 60 %
Platelet Count: 225 10*3/uL (ref 150–400)
RBC: 3.69 MIL/uL — ABNORMAL LOW (ref 4.22–5.81)
RDW: 14.2 % (ref 11.5–15.5)
WBC Count: 5.1 10*3/uL (ref 4.0–10.5)
nRBC: 0 % (ref 0.0–0.2)

## 2020-06-24 LAB — CMP (CANCER CENTER ONLY)
ALT: <6 U/L (ref 0–44)
AST: 13 U/L — ABNORMAL LOW (ref 15–41)
Albumin: 3.4 g/dL — ABNORMAL LOW (ref 3.5–5.0)
Alkaline Phosphatase: 83 U/L (ref 38–126)
Anion gap: 7 (ref 5–15)
BUN: 21 mg/dL (ref 8–23)
CO2: 26 mmol/L (ref 22–32)
Calcium: 9.4 mg/dL (ref 8.9–10.3)
Chloride: 106 mmol/L (ref 98–111)
Creatinine: 2.61 mg/dL — ABNORMAL HIGH (ref 0.61–1.24)
GFR, Estimated: 24 mL/min — ABNORMAL LOW (ref >60)
Glucose, Bld: 95 mg/dL (ref 70–99)
Potassium: 4.8 mmol/L (ref 3.5–5.1)
Sodium: 139 mmol/L (ref 135–145)
Total Bilirubin: 0.3 mg/dL (ref 0.3–1.2)
Total Protein: 7.1 g/dL (ref 6.5–8.1)

## 2020-06-24 LAB — IRON AND TIBC
Iron: 34 ug/dL — ABNORMAL LOW (ref 42–163)
Saturation Ratios: 10 % — ABNORMAL LOW (ref 20–55)
TIBC: 331 ug/dL (ref 202–409)
UIBC: 298 ug/dL (ref 117–376)

## 2020-06-24 LAB — FERRITIN: Ferritin: 24 ng/mL (ref 24–336)

## 2020-06-24 MED ORDER — SODIUM CHLORIDE (PF) 0.9 % IJ SOLN
INTRAMUSCULAR | Status: AC
  Filled 2020-06-24: qty 50

## 2020-06-24 MED ORDER — IOHEXOL 300 MG/ML  SOLN
100.0000 mL | Freq: Once | INTRAMUSCULAR | Status: AC | PRN
  Administered 2020-06-24: 100 mL via INTRAVENOUS

## 2020-06-28 DIAGNOSIS — Z85820 Personal history of malignant melanoma of skin: Secondary | ICD-10-CM | POA: Diagnosis not present

## 2020-07-01 ENCOUNTER — Inpatient Hospital Stay (HOSPITAL_BASED_OUTPATIENT_CLINIC_OR_DEPARTMENT_OTHER): Payer: Medicare Other | Admitting: Oncology

## 2020-07-01 ENCOUNTER — Other Ambulatory Visit: Payer: Self-pay

## 2020-07-01 VITALS — BP 112/63 | HR 61 | Temp 97.0°F | Resp 17 | Ht 69.0 in | Wt 112.4 lb

## 2020-07-01 DIAGNOSIS — Z7901 Long term (current) use of anticoagulants: Secondary | ICD-10-CM | POA: Diagnosis not present

## 2020-07-01 DIAGNOSIS — D509 Iron deficiency anemia, unspecified: Secondary | ICD-10-CM | POA: Diagnosis not present

## 2020-07-01 DIAGNOSIS — Z85820 Personal history of malignant melanoma of skin: Secondary | ICD-10-CM | POA: Diagnosis not present

## 2020-07-01 DIAGNOSIS — Z85038 Personal history of other malignant neoplasm of large intestine: Secondary | ICD-10-CM | POA: Diagnosis not present

## 2020-07-01 DIAGNOSIS — C434 Malignant melanoma of scalp and neck: Secondary | ICD-10-CM | POA: Diagnosis not present

## 2020-07-01 DIAGNOSIS — Z79899 Other long term (current) drug therapy: Secondary | ICD-10-CM | POA: Diagnosis not present

## 2020-07-01 NOTE — Progress Notes (Signed)
Hematology and Oncology Follow Up   Keith [name] [id number] [birth year] 80 y.o. 07/01/2020 1:12 PM Keith [name], MDFrank, [name], Keith [name]      Principle Diagnosis: 80 year old man with:  1. Stage IIc melanoma of the scalp diagnosed in April 2019.  He has no evidence of relapse or recurrent disease at this time.  2.  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  He is currently under evaluation for possible surgical resection.   Prior Therapy:    He is status post excision of a scalp lesion and sentinel lymph node biopsy and a selective left neck dissection completed in April 2019.   Current therapy: Under evaluation for colon cancer resection.  Active surveillance for his melanoma.      Interim History: Keith [name] returns today for a follow-up visit.  Since the last visit, he was evaluated by Keith [name] and decided to proceed with surgery.  He underwent a CT scan chest abdomen and pelvis on June 24, 2020 which did not show any evidence of metastatic disease.  Clinically, he feels reasonably well without any major complaints.  He does take oral iron replacement with some occasional constipation.  Otherwise he remains reasonably active and attends to activities of daily living.      Medications: Updated on review. Current Outpatient Medications  Medication Sig Dispense Refill   acetaminophen (TYLENOL) 500 MG tablet Take 1 tablet (500 mg total) by mouth every 6 (six) hours as needed. 30 tablet 0   apixaban (ELIQUIS) 2.5 MG TABS tablet Take 1 tablet (2.5 mg total) by mouth 2 (two) times daily. 180 tablet 1   atorvastatin (LIPITOR) 40 MG tablet TAKE 1 TABLET (40 MG TOTAL) BY MOUTH DAILY AT 6 PM. 90 tablet 1   cholecalciferol (VITAMIN D3) 25 MCG (1000 UNIT) tablet Take 1,000 Units by mouth daily.     Dapsone 5 % topical gel Apply 1 application topically 2 (two) times daily.      famotidine (PEPCID) 20 MG tablet TAKE 1 TABLET BY MOUTH TWICE A DAY 180 tablet 1   ferrous sulfate 325 (65 FE)  MG tablet Take 325 mg by mouth daily.      Multiple Vitamin (MULTIVITAMIN) capsule Take 1 capsule by mouth daily.      timolol (TIMOPTIC) 0.5 % ophthalmic solution Place 1 drop into both eyes 2 (two) times daily.   3   No current facility-administered medications for this visit.   Facility-Administered Medications Ordered in Other Visits  Medication Dose Route Frequency Provider Last Rate Last Admin   clindamycin (CLEOCIN) 900 mg in dextrose 5 % 50 mL IVPB  900 mg Intravenous 60 min Pre-Op Keith Ruff, Keith [name]       And   gentamicin (GARAMYCIN) 260 mg in dextrose 5 % 50 mL IVPB  5 mg/kg Intravenous 60 min Pre-Op Keith Ruff, Keith [name]         Allergies:  Allergies  Allergen Reactions   Penicillins Other (See Comments)    UNKNOWN REACTION FROM CHILDHOOD  Has patient had a PCN reaction causing immediate rash, facial/tongue/throat swelling, SOB or lightheadedness with hypotension: Unknown Has patient had a PCN reaction causing severe rash involving mucus membranes or skin necrosis: Unknown Has patient had a PCN reaction that required hospitalization: Unknown Has patient had a PCN reaction occurring within the last 10 years: No If all of the above answers are "NO", then may proceed with Cephalosporin use.    Physical exam:    Blood pressure 112/63, pulse 61, temperature (!)  97 F (36.1 C), temperature source Tympanic, resp. rate 17, height 5\' 9"  (1.753 m), weight 112 lb 6.4 oz (51 kg), SpO2 99 %.      ECOG 1    General appearance: Alert, awake without any distress. Head: Atraumatic without abnormalities Oropharynx: Without any thrush or ulcers. Eyes: No scleral icterus. Lymph nodes: No lymphadenopathy noted in the cervical, supraclavicular, or axillary nodes Heart:regular rate and rhythm, without any murmurs or gallops.   Lung: Clear to auscultation without any rhonchi, wheezes or dullness to percussion. Abdomin: Soft, nontender without any shifting dullness or  ascites. Musculoskeletal: No clubbing or cyanosis. Neurological: No motor or sensory deficits. Skin: Nonhealing wound noted on his scalp.  Unchanged traumatically at this time.         Lab Results: Lab Results  Component Value Date   WBC 5.1 06/24/2020   HGB 10.4 (L) 06/24/2020   HCT 33.3 (L) 06/24/2020   MCV 90.2 06/24/2020   PLT 225 06/24/2020     Chemistry      Component Value Date/Time   NA 139 06/24/2020 1346   NA 139 05/10/2020 0956   K 4.8 06/24/2020 1346   CL 106 06/24/2020 1346   CO2 26 06/24/2020 1346   BUN 21 06/24/2020 1346   BUN 23 05/10/2020 0956   CREATININE 2.61 (H) 06/24/2020 1346      Component Value Date/Time   CALCIUM 9.4 06/24/2020 1346   CALCIUM 7.5 (L) 07/11/2010 0400   ALKPHOS 83 06/24/2020 1346   AST 13 (L) 06/24/2020 1346   ALT <6 06/24/2020 1346   BILITOT 0.3 06/24/2020 1346      IMPRESSION: 1. Abnormal focal wall thickening in the proximal transverse colon likely reflecting tumor found at endoscopy, without findings of metastatic disease. 2. Biapical pleuroparenchymal scarring in the chest appears benign. 3. Coronary, aortic arch, and branch vessel atherosclerotic vascular disease. Known occluded left internal carotid artery as worked up on prior exams. Persistent left SVC draining into the coronary sinus. 4. 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 5. Other imaging findings of potential clinical significance: Gynecomastia. Bilateral renal atrophy likely related to scarring. Rim calcified 0.8 cm right renal artery aneurysm. Mild dextroconvex lumbar scoliosis.  80 year old man with:   1.     Melanoma of the scalp diagnosed in March 2019.  He was found to have stage IIC disease.  His disease status was updated at this time and treatment options were discussed.  CT scan on June 24, 2020 which did not show any evidence of metastatic disease.  I  recommended continued active surveillance at this time.  2.  Transverse colon cancer diagnosed in April 2021.    He has had deferred colon surgery since the time of diagnosis but is more open to it at this time.  Imaging studies obtained on June 24, 2020 did not show any evidence of metastatic disease or obstruction.  After surgical resection consideration for adjuvant therapy will be discussed pending results of his pathology.  He might not be an excellent candidate for adjuvant chemotherapy however.  3.  Dermatology surveillance: I recommended continued surveillance at this time.  4.  Anemia: Related to iron deficiency from his colon cancer.  Recommended continuing oral iron replacement.   5.  Follow-up: In 6 months for repeat follow-up.  30  minutes were spent on this encounter.  Time was dedicated to reviewing his imaging studies, disease status update, treatment options and future  plan of care review.  Zola Button, Keith [name] 07/01/2020 1:12 PM

## 2020-07-26 ENCOUNTER — Telehealth: Payer: Self-pay | Admitting: Pharmacist

## 2020-07-26 NOTE — Telephone Encounter (Signed)
Trish, patient daughter in law called to clarify Eliquis dosing.  Patient is currently taking Eliquis 2.5 mg every other day due to history of GI bleed.  Patient also has history of A Fib and stroke.  Advised that he should be taking Eliquis 2.5mg  BID unless told otherwise. This was also reiterated by Oda Kilts during two office visits in April.  Daughter in law voiced understanding.

## 2020-07-28 NOTE — Patient Instructions (Addendum)
DUE TO COVID-19 ONLY ONE VISITOR IS ALLOWED TO COME WITH YOU AND STAY IN THE WAITING ROOM ONLY DURING PRE OP AND PROCEDURE DAY OF SURGERY. THE 1 VISITOR  MAY VISIT WITH YOU AFTER SURGERY IN YOUR PRIVATE ROOM DURING VISITING HOURS ONLY!  YOU NEED TO HAVE A COVID 19 TEST ON: 08/03/20 @ 9:00 AM, THIS TEST MUST BE DONE BEFORE SURGERY,  COVID TESTING SITE [place] [id number], IT IS ON THE RIGHT GOING OUT [address] APPROXIMATELY  2 MINUTES PAST [employer] ON THE RIGHT. ONCE YOUR COVID TEST IS COMPLETED,  PLEASE BEGIN THE QUARANTINE INSTRUCTIONS AS OUTLINED IN YOUR HANDOUT.                Keith [name]   Your procedure is scheduled on: 08/06/20   Report to [facility] Main  Entrance   Report to admitting at: 9:00 AM     Call this number if you have problems the morning of surgery [phone]    Remember:  DRINK 2 PRESURGERY ENSURE DRINKS THE NIGHT BEFORE SURGERY AT  1000 PM AND 1 PRESURGERY DRINK THE DAY OF THE PROCEDURE 3 HOURS PRIOR TO SCHEDULED SURGERY. NO SOLIDS AFTER MIDNIGHT THE DAY PRIOR TO THE SURGERY. NOTHING BY MOUTH EXCEPT CLEAR LIQUIDS UNTIL THREE HOURS PRIOR TO SCHEDULED SURGERY. PLEASE FINISH PRESURGERY ENSURE DRINK PER SURGEON ORDER 3 HOURS PRIOR TO SCHEDULED SURGERY TIME WHICH NEEDS TO BE COMPLETED AT: 8:00 AM   CLEAR LIQUID DIET  Foods Allowed                                                                     Foods Excluded  Coffee and tea, regular and decaf                             liquids that you cannot  Plain Jell-O any favor except red or purple                                           see through such as: Fruit ices (not with fruit pulp)                                     milk, soups, orange juice  Iced Popsicles                                    All solid food Carbonated beverages, regular and diet                                    Cranberry, grape and apple juices Sports drinks like Gatorade Lightly seasoned  clear broth or consume(fat free) Sugar, honey syrup  Sample Menu Breakfast  Lunch                                     Supper Cranberry juice                    Beef broth                            Chicken broth Jell-O                                     Grape juice                           Apple juice Coffee or tea                        Jell-O                                      Popsicle                                                Coffee or tea                        Coffee or tea   MAKE SURE YOU DRINK PLENTY CLEAR LIQUIDS THE DAY OF THE PREP.  BRUSH YOUR TEETH MORNING OF SURGERY AND RINSE YOUR MOUTH OUT, NO CHEWING GUM CANDY OR MINTS.    Take these medicines the morning of surgery with A SIP OF WATER: N/A . USE EYE DROPS AS USUAL.                               You may not have any metal on your body including hair pins and              piercings  Do not wear jewelry,lotions, powders or perfumes, deodorant             Men may shave face and neck.   Do not bring valuables to the hospital. [place].  Contacts, dentures or bridgework may not be worn into surgery.  Leave suitcase in the car. After surgery it may be brought to your room.     Patients discharged the day of surgery will not be allowed to drive home. IF YOU ARE HAVING SURGERY AND GOING HOME THE SAME DAY, YOU MUST HAVE AN ADULT TO DRIVE YOU HOME AND BE WITH YOU FOR 24 HOURS. YOU MAY GO HOME BY TAXI OR UBER OR ORTHERWISE, BUT AN ADULT MUST ACCOMPANY YOU HOME AND STAY WITH YOU FOR 24 HOURS.  Name and phone number of your driver:  Special Instructions: N/A              Please read over the following fact sheets you were given: _____________________________________________________________________  [place] - Preparing for Surgery Before surgery, you can play an important role.  Because skin is not sterile, your skin needs to be as  free of germs as possible.  You can reduce the number of germs on your skin by washing with CHG (chlorahexidine gluconate) soap before surgery.  CHG is an antiseptic cleaner which kills germs and bonds with the skin to continue killing germs even after washing. Please DO NOT use if you have an allergy to CHG or antibacterial soaps.  If your skin becomes reddened/irritated stop using the CHG and inform your nurse when you arrive at Short Stay. Do not shave (including legs and underarms) for at least 48 hours prior to the first CHG shower.  You may shave your face/neck. Please follow these instructions carefully:  1.  Shower with CHG Soap the night before surgery and the  morning of Surgery.  2.  If you choose to wash your hair, wash your hair first as usual with your  normal  shampoo.  3.  After you shampoo, rinse your hair and body thoroughly to remove the  shampoo.                           4.  Use CHG as you would any other liquid soap.  You can apply chg directly  to the skin and wash                       Gently with a scrungie or clean washcloth.  5.  Apply the CHG Soap to your body ONLY FROM THE NECK DOWN.   Do not use on face/ open                           Wound or open sores. Avoid contact with eyes, ears mouth and genitals (private parts).                       Wash face,  Genitals (private parts) with your normal soap.             6.  Wash thoroughly, paying special attention to the area where your surgery  will be performed.  7.  Thoroughly rinse your body with warm water from the neck down.  8.  DO NOT shower/wash with your normal soap after using and rinsing off  the CHG Soap.                9.  Pat yourself dry with a clean towel.            10.  Wear clean pajamas.            11.  Place clean sheets on your bed the night of your first shower and do not  sleep with pets. Day of Surgery : Do not apply any lotions/deodorants the morning of surgery.  Please wear clean clothes to the  hospital/surgery center.  FAILURE TO FOLLOW THESE INSTRUCTIONS MAY RESULT IN THE CANCELLATION OF YOUR SURGERY PATIENT SIGNATURE_________________________________  NURSE SIGNATURE__________________________________  ________________________________________________________________________   Keith Phenix  An incentive spirometer is a tool that can help keep your lungs clear and active. This tool measures how well you are filling your lungs with each breath. Taking long deep breaths may help reverse or decrease the chance of developing breathing (pulmonary) problems (especially infection) following: A long period of time  when you are unable to move or be active. BEFORE THE PROCEDURE  If the spirometer includes an indicator to show your best effort, your nurse or respiratory therapist will set it to a desired goal. If possible, sit up straight or lean slightly forward. Try not to slouch. Hold the incentive spirometer in an upright position. INSTRUCTIONS FOR USE  Sit on the edge of your bed if possible, or sit up as far as you can in bed or on a chair. Hold the incentive spirometer in an upright position. Breathe out normally. Place the mouthpiece in your mouth and seal your lips tightly around it. Breathe in slowly and as deeply as possible, raising the piston or the ball toward the top of the column. Hold your breath for 3-5 seconds or for as long as possible. Allow the piston or ball to fall to the bottom of the column. Remove the mouthpiece from your mouth and breathe out normally. Rest for a few seconds and repeat Steps 1 through 7 at least 10 times every 1-2 hours when you are awake. Take your time and take a few normal breaths between deep breaths. The spirometer may include an indicator to show your best effort. Use the indicator as a goal to work toward during each repetition. After each set of 10 deep breaths, practice coughing to be sure your lungs are clear. If you have an  incision (the cut made at the time of surgery), support your incision when coughing by placing a pillow or rolled up towels firmly against it. Once you are able to get out of bed, walk around indoors and cough well. You may stop using the incentive spirometer when instructed by your caregiver.  RISKS AND COMPLICATIONS Take your time so you do not get dizzy or light-headed. If you are in pain, you may need to take or ask for pain medication before doing incentive spirometry. It is harder to take a deep breath if you are having pain. AFTER USE Rest and breathe slowly and easily. It can be helpful to keep track of a log of your progress. Your caregiver can provide you with a simple table to help with this. If you are using the spirometer at home, follow these instructions: [place] IF:  You are having difficultly using the spirometer. You have trouble using the spirometer as often as instructed. Your pain medication is not giving enough relief while using the spirometer. You develop fever of 100.5 F (38.1 C) or higher. SEEK IMMEDIATE MEDICAL CARE IF:  You cough up bloody sputum that had not been present before. You develop fever of 102 F (38.9 C) or greater. You develop worsening pain at or near the incision site. MAKE SURE YOU:  Understand these instructions. Will watch your condition. Will get help right away if you are not doing well or get worse. Document Released: 05/15/2006 Document Revised: 03/27/2011 Document Reviewed: 07/16/2006 [facility] Patient Information 2014 [place], [place].   ________________________________________________________________________

## 2020-07-29 ENCOUNTER — Other Ambulatory Visit: Payer: Self-pay

## 2020-07-29 ENCOUNTER — Encounter (HOSPITAL_COMMUNITY)
Admission: RE | Admit: 2020-07-29 | Discharge: 2020-07-29 | Disposition: A | Discharge status: Home / Self Care | Payer: Medicare Other | Source: Ambulatory Visit | Attending: General Surgery | Admitting: General Surgery

## 2020-07-29 ENCOUNTER — Encounter (HOSPITAL_COMMUNITY): Payer: Self-pay

## 2020-07-29 DIAGNOSIS — Z01812 Encounter for preprocedural laboratory examination: Secondary | ICD-10-CM | POA: Diagnosis not present

## 2020-07-29 HISTORY — DX: Cardiac arrhythmia, unspecified: I49.9

## 2020-07-29 HISTORY — DX: Anemia, unspecified: D64.9

## 2020-07-29 LAB — CBC
HCT: 35.2 % — ABNORMAL LOW (ref 39.0–52.0)
Hemoglobin: 10.8 g/dL — ABNORMAL LOW (ref 13.0–17.0)
MCH: 28.3 pg (ref 26.0–34.0)
MCHC: 30.7 g/dL (ref 30.0–36.0)
MCV: 92.1 fL (ref 80.0–100.0)
Platelets: 200 10*3/uL (ref 150–400)
RBC: 3.82 MIL/uL — ABNORMAL LOW (ref 4.22–5.81)
RDW: 14.4 % (ref 11.5–15.5)
WBC: 3.8 10*3/uL — ABNORMAL LOW (ref 4.0–10.5)
nRBC: 0 % (ref 0.0–0.2)

## 2020-07-29 LAB — BASIC METABOLIC PANEL
Anion gap: 4 — ABNORMAL LOW (ref 5–15)
BUN: 24 mg/dL — ABNORMAL HIGH (ref 8–23)
CO2: 24 mmol/L (ref 22–32)
Calcium: 9 mg/dL (ref 8.9–10.3)
Chloride: 109 mmol/L (ref 98–111)
Creatinine, Ser: 2.35 mg/dL — ABNORMAL HIGH (ref 0.61–1.24)
GFR, Estimated: 28 mL/min — ABNORMAL LOW (ref >60)
Glucose, Bld: 104 mg/dL — ABNORMAL HIGH (ref 70–99)
Potassium: 4.5 mmol/L (ref 3.5–5.1)
Sodium: 137 mmol/L (ref 135–145)

## 2020-07-29 NOTE — Progress Notes (Addendum)
COVID Vaccine Completed: Yes Date COVID Vaccine completed:01/05/20 [place] COVID vaccine manufacturer: [place]       PCP - DO: Rise Patience Cardiologist - Dr. [name].Clearance: Daleen Snook Kroeger: PA-C.: 05/25/20: EPIC  Chest x-ray - CT chest: 06/25/20 EKG - 04/26/20 EPIC Stress Test -  ECHO - 11/21/16 Cardiac Cath -  Pacemaker/ICD device last checked: Pt. Has an inactive loop recorder.  Sleep Study -  CPAP -   Fasting Blood Sugar -  Checks Blood Sugar _____ times a day  Blood Thinner Instructions: Eliquis will be on hold 3 days before surgery,as per Dr. Lovena Le instructions(Epic 06/15/20) Aspirin Instructions: Last Dose:  Anesthesia review: Hx: Stroke,Afib,loop recorder(inactive since 2020 as per pt. And his family),CKD. Pt. Can go a flight of stairs without getting SOB. Wound on scalp that has not heal,is been followed be dermatologist,plan to do skin graft.  Patient denies shortness of breath, fever, cough and chest pain at PAT appointment   Patient verbalized understanding of instructions that were given to them at the PAT appointment. Patient was also instructed that they will need to review over the PAT instructions again at home before surgery.

## 2020-07-29 NOTE — Progress Notes (Signed)
Lab. Results Creatinine: 2.35

## 2020-07-30 NOTE — Anesthesia Preprocedure Evaluation (Addendum)
Anesthesia Evaluation  Patient identified by MRN, date of birth, ID band Patient awake    Reviewed: Allergy & Precautions, H&P , NPO status , Patient's Chart, lab work & pertinent test results, reviewed documented beta blocker date and time   History of Anesthesia Complications (+) PONV and history of anesthetic complications  Airway Mallampati: II  TM Distance: >3 FB Neck ROM: full    Dental no notable dental hx. (+) Edentulous Upper, Edentulous Lower   Pulmonary neg pulmonary ROS, former smoker,    Pulmonary exam normal breath sounds clear to auscultation       Cardiovascular Exercise Tolerance: Good + dysrhythmias Atrial Fibrillation  Rhythm:regular Rate:Normal   Echo:  Left ventricle: The cavity size was normal. Wall thickness was  normal. Systolic function was normal. The estimated ejection  fraction was in the range of 55% to 60%. Wall motion was normal;  there were no regional wall motion abnormalities. Features are  consistent with a pseudonormal left ventricular filling pattern,  with concomitant abnormal relaxation and increased filling  pressure (grade 2 diastolic dysfunction).  - Aortic valve: There was no stenosis. There was mild  regurgitation.  - Mitral valve: There was trivial regurgitation.  - Right ventricle: The cavity size was normal. Systolic function  was normal.  - Pulmonary arteries: No complete TR doppler jet so unable to  estimate PA systolic pressure.  - Inferior vena cava: The vessel was normal in size. The  respirophasic diameter changes were in the normal range (>= 50%),  consistent with normal central venous pressure.      Neuro/Psych CVA, No Residual Symptoms negative psych ROS   GI/Hepatic Neg liver ROS, hiatal hernia, GERD  Medicated,  Endo/Other  Hypothyroidism   Renal/GU CRFRenal disease  negative genitourinary   Musculoskeletal   Abdominal   Peds   Hematology  (+) Blood dyscrasia, anemia ,   Anesthesia Other Findings   Reproductive/Obstetrics negative OB ROS                            Anesthesia Physical Anesthesia Plan  ASA: 3  Anesthesia Plan: General   Post-op Pain Management:    Induction: Intravenous  PONV Risk Score and Plan: 2 and Ondansetron and Dexamethasone  Airway Management Planned: Oral ETT  Additional Equipment: None  Intra-op Plan:   Post-operative Plan: Extubation in OR  Informed Consent: I have reviewed the patients History and Physical, chart, labs and discussed the procedure including the risks, benefits and alternatives for the proposed anesthesia with the patient or authorized representative who has indicated his/her understanding and acceptance.     Dental Advisory Given  Plan Discussed with: CRNA and Anesthesiologist  Anesthesia Plan Comments: (Per cardiology note 06/15/20, "Chart reviewed as part of pre-operative protocol coverage. Patient was contacted 06/15/2020 in reference to pre-operative risk assessment for pending surgery as outlined below.  Keith [name] was last seen on 05/10/20 by Oda Kilts, PA-C.  Since that day, Keith [name] has done fine from a cardiac standpoint. He can complete 4 METs without anginal complaints.   Therefore, based on ACC/AHA guidelines, the patient would be at acceptable risk for the planned procedure without further cardiovascular testing.  The patient was advised that if he develops new symptoms prior to surgery to contact our office to arrange for a follow-up visit, and he verbalized understanding."  Dr. Rayann Heman is on vacation this week, however was able to speak with Dr. Lovena Le in regards  to his eliquis. Per Dr. Lovena Le, patient can hold eliquis up to 3 days prior to his upcoming procedure with plans to restart when cleared to do so by his surgeon. He was felt to be high risk for bleeding complications so bridging with lovenox was  not recommended. "  Will use 6.5 to 7.0 ETT because of previous difficulty swallowing post op )      Anesthesia Quick Evaluation

## 2020-08-03 ENCOUNTER — Other Ambulatory Visit (HOSPITAL_COMMUNITY)
Admission: RE | Admit: 2020-08-03 | Discharge: 2020-08-03 | Disposition: A | Discharge status: Home / Self Care | Payer: Medicare Other | Source: Ambulatory Visit | Attending: General Surgery | Admitting: General Surgery

## 2020-08-03 DIAGNOSIS — Z8673 Personal history of transient ischemic attack (TIA), and cerebral infarction without residual deficits: Secondary | ICD-10-CM | POA: Diagnosis not present

## 2020-08-03 DIAGNOSIS — R197 Diarrhea, unspecified: Secondary | ICD-10-CM | POA: Diagnosis not present

## 2020-08-03 DIAGNOSIS — N184 Chronic kidney disease, stage 4 (severe): Secondary | ICD-10-CM | POA: Diagnosis not present

## 2020-08-03 DIAGNOSIS — E43 Unspecified severe protein-calorie malnutrition: Secondary | ICD-10-CM | POA: Diagnosis not present

## 2020-08-03 DIAGNOSIS — C184 Malignant neoplasm of transverse colon: Secondary | ICD-10-CM | POA: Diagnosis not present

## 2020-08-03 DIAGNOSIS — K219 Gastro-esophageal reflux disease without esophagitis: Secondary | ICD-10-CM | POA: Diagnosis not present

## 2020-08-03 DIAGNOSIS — R079 Chest pain, unspecified: Secondary | ICD-10-CM | POA: Diagnosis not present

## 2020-08-03 DIAGNOSIS — Z01812 Encounter for preprocedural laboratory examination: Secondary | ICD-10-CM | POA: Insufficient documentation

## 2020-08-03 DIAGNOSIS — R339 Retention of urine, unspecified: Secondary | ICD-10-CM | POA: Diagnosis not present

## 2020-08-03 DIAGNOSIS — Z79899 Other long term (current) drug therapy: Secondary | ICD-10-CM | POA: Diagnosis not present

## 2020-08-03 DIAGNOSIS — Z85820 Personal history of malignant melanoma of skin: Secondary | ICD-10-CM | POA: Diagnosis not present

## 2020-08-03 DIAGNOSIS — Z20822 Contact with and (suspected) exposure to covid-19: Secondary | ICD-10-CM | POA: Insufficient documentation

## 2020-08-03 DIAGNOSIS — Z87891 Personal history of nicotine dependence: Secondary | ICD-10-CM | POA: Diagnosis not present

## 2020-08-03 DIAGNOSIS — E039 Hypothyroidism, unspecified: Secondary | ICD-10-CM | POA: Diagnosis not present

## 2020-08-03 DIAGNOSIS — R54 Age-related physical debility: Secondary | ICD-10-CM | POA: Diagnosis not present

## 2020-08-03 DIAGNOSIS — I4891 Unspecified atrial fibrillation: Secondary | ICD-10-CM | POA: Diagnosis not present

## 2020-08-03 DIAGNOSIS — K660 Peritoneal adhesions (postprocedural) (postinfection): Secondary | ICD-10-CM | POA: Diagnosis not present

## 2020-08-03 DIAGNOSIS — C187 Malignant neoplasm of sigmoid colon: Secondary | ICD-10-CM | POA: Diagnosis not present

## 2020-08-03 DIAGNOSIS — Z880 Allergy status to penicillin: Secondary | ICD-10-CM | POA: Diagnosis not present

## 2020-08-03 DIAGNOSIS — Z681 Body mass index (BMI) 19 or less, adult: Secondary | ICD-10-CM | POA: Diagnosis not present

## 2020-08-03 DIAGNOSIS — I2510 Atherosclerotic heart disease of native coronary artery without angina pectoris: Secondary | ICD-10-CM | POA: Diagnosis not present

## 2020-08-03 LAB — SARS CORONAVIRUS 2 (TAT 6-24 HRS): SARS Coronavirus 2: NEGATIVE

## 2020-08-05 ENCOUNTER — Encounter (HOSPITAL_COMMUNITY): Payer: Self-pay | Admitting: General Surgery

## 2020-08-05 MED ORDER — BUPIVACAINE LIPOSOME 1.3 % IJ SUSP
20.0000 mL | Freq: Once | INTRAMUSCULAR | Status: DC
  Filled 2020-08-05: qty 20

## 2020-08-06 ENCOUNTER — Inpatient Hospital Stay (HOSPITAL_COMMUNITY)
Admission: RE | Admit: 2020-08-06 | Discharge: 2020-08-11 | DRG: 329 | Disposition: A | Discharge status: Home Health | Payer: Medicare Other | Source: Ambulatory Visit | Attending: General Surgery | Admitting: General Surgery

## 2020-08-06 ENCOUNTER — Encounter (HOSPITAL_COMMUNITY): Payer: Self-pay | Admitting: General Surgery

## 2020-08-06 ENCOUNTER — Other Ambulatory Visit: Payer: Self-pay

## 2020-08-06 ENCOUNTER — Inpatient Hospital Stay (HOSPITAL_COMMUNITY): Payer: Medicare Other | Admitting: Physician Assistant

## 2020-08-06 ENCOUNTER — Inpatient Hospital Stay (HOSPITAL_COMMUNITY): Payer: Medicare Other | Admitting: Anesthesiology

## 2020-08-06 ENCOUNTER — Encounter (HOSPITAL_COMMUNITY)
Admission: RE | Disposition: A | Discharge status: Home Health | Payer: Self-pay | Source: Ambulatory Visit | Attending: General Surgery

## 2020-08-06 DIAGNOSIS — I4891 Unspecified atrial fibrillation: Secondary | ICD-10-CM | POA: Diagnosis not present

## 2020-08-06 DIAGNOSIS — E43 Unspecified severe protein-calorie malnutrition: Secondary | ICD-10-CM | POA: Diagnosis present

## 2020-08-06 DIAGNOSIS — E559 Vitamin D deficiency, unspecified: Secondary | ICD-10-CM | POA: Diagnosis not present

## 2020-08-06 DIAGNOSIS — R079 Chest pain, unspecified: Secondary | ICD-10-CM | POA: Diagnosis present

## 2020-08-06 DIAGNOSIS — E039 Hypothyroidism, unspecified: Secondary | ICD-10-CM | POA: Diagnosis present

## 2020-08-06 DIAGNOSIS — Z681 Body mass index (BMI) 19 or less, adult: Secondary | ICD-10-CM | POA: Diagnosis not present

## 2020-08-06 DIAGNOSIS — R339 Retention of urine, unspecified: Secondary | ICD-10-CM | POA: Diagnosis not present

## 2020-08-06 DIAGNOSIS — K219 Gastro-esophageal reflux disease without esophagitis: Secondary | ICD-10-CM | POA: Diagnosis present

## 2020-08-06 DIAGNOSIS — N184 Chronic kidney disease, stage 4 (severe): Secondary | ICD-10-CM | POA: Diagnosis present

## 2020-08-06 DIAGNOSIS — R54 Age-related physical debility: Secondary | ICD-10-CM | POA: Diagnosis present

## 2020-08-06 DIAGNOSIS — C184 Malignant neoplasm of transverse colon: Secondary | ICD-10-CM | POA: Diagnosis not present

## 2020-08-06 DIAGNOSIS — I2510 Atherosclerotic heart disease of native coronary artery without angina pectoris: Secondary | ICD-10-CM | POA: Diagnosis not present

## 2020-08-06 DIAGNOSIS — Z20822 Contact with and (suspected) exposure to covid-19: Secondary | ICD-10-CM | POA: Diagnosis present

## 2020-08-06 DIAGNOSIS — Z87891 Personal history of nicotine dependence: Secondary | ICD-10-CM | POA: Diagnosis not present

## 2020-08-06 DIAGNOSIS — Z85820 Personal history of malignant melanoma of skin: Secondary | ICD-10-CM | POA: Diagnosis not present

## 2020-08-06 DIAGNOSIS — C187 Malignant neoplasm of sigmoid colon: Secondary | ICD-10-CM | POA: Diagnosis not present

## 2020-08-06 DIAGNOSIS — R531 Weakness: Secondary | ICD-10-CM | POA: Diagnosis not present

## 2020-08-06 DIAGNOSIS — Z880 Allergy status to penicillin: Secondary | ICD-10-CM | POA: Diagnosis not present

## 2020-08-06 DIAGNOSIS — Z8673 Personal history of transient ischemic attack (TIA), and cerebral infarction without residual deficits: Secondary | ICD-10-CM | POA: Diagnosis not present

## 2020-08-06 DIAGNOSIS — R197 Diarrhea, unspecified: Secondary | ICD-10-CM | POA: Diagnosis not present

## 2020-08-06 DIAGNOSIS — K660 Peritoneal adhesions (postprocedural) (postinfection): Secondary | ICD-10-CM | POA: Diagnosis present

## 2020-08-06 DIAGNOSIS — E785 Hyperlipidemia, unspecified: Secondary | ICD-10-CM | POA: Diagnosis not present

## 2020-08-06 DIAGNOSIS — Z79899 Other long term (current) drug therapy: Secondary | ICD-10-CM | POA: Diagnosis not present

## 2020-08-06 DIAGNOSIS — I480 Paroxysmal atrial fibrillation: Secondary | ICD-10-CM | POA: Diagnosis not present

## 2020-08-06 LAB — TYPE AND SCREEN
ABO/RH(D): O POS
Antibody Screen: NEGATIVE

## 2020-08-06 SURGERY — COLECTOMY, PARTIAL, ROBOT-ASSISTED, LAPAROSCOPIC
Anesthesia: General | Site: Abdomen

## 2020-08-06 MED ORDER — KCL IN DEXTROSE-NACL 20-5-0.45 MEQ/L-%-% IV SOLN
INTRAVENOUS | Status: DC
  Filled 2020-08-06 (×4): qty 1000

## 2020-08-06 MED ORDER — SUCCINYLCHOLINE CHLORIDE 200 MG/10ML IV SOSY
PREFILLED_SYRINGE | INTRAVENOUS | Status: AC
  Filled 2020-08-06: qty 10

## 2020-08-06 MED ORDER — CHLORHEXIDINE GLUCONATE 0.12 % MT SOLN
15.0000 mL | Freq: Once | OROMUCOSAL | Status: AC
  Administered 2020-08-06: 15 mL via OROMUCOSAL

## 2020-08-06 MED ORDER — LACTATED RINGERS IV SOLN
INTRAVENOUS | Status: DC
  Administered 2020-08-06: 1000 mL via INTRAVENOUS

## 2020-08-06 MED ORDER — ENSURE PRE-SURGERY PO LIQD
296.0000 mL | Freq: Once | ORAL | Status: DC
  Filled 2020-08-06: qty 296

## 2020-08-06 MED ORDER — SODIUM CHLORIDE 0.9 % IV SOLN
2.0000 g | INTRAVENOUS | Status: AC
  Administered 2020-08-06: 2 g via INTRAVENOUS
  Filled 2020-08-06: qty 2

## 2020-08-06 MED ORDER — MIDAZOLAM HCL 5 MG/5ML IJ SOLN
INTRAMUSCULAR | Status: DC | PRN
  Administered 2020-08-06: 1 mg via INTRAVENOUS

## 2020-08-06 MED ORDER — ALVIMOPAN 12 MG PO CAPS
12.0000 mg | ORAL_CAPSULE | Freq: Two times a day (BID) | ORAL | Status: DC
  Administered 2020-08-07: 12 mg via ORAL
  Filled 2020-08-06: qty 1

## 2020-08-06 MED ORDER — ALUM & MAG HYDROXIDE-SIMETH 200-200-20 MG/5ML PO SUSP
30.0000 mL | Freq: Four times a day (QID) | ORAL | Status: DC | PRN
  Administered 2020-08-09: 30 mL via ORAL
  Filled 2020-08-06: qty 30

## 2020-08-06 MED ORDER — FENTANYL CITRATE (PF) 100 MCG/2ML IJ SOLN
INTRAMUSCULAR | Status: AC
  Filled 2020-08-06: qty 2

## 2020-08-06 MED ORDER — SUGAMMADEX SODIUM 200 MG/2ML IV SOLN
INTRAVENOUS | Status: DC | PRN
  Administered 2020-08-06: 200 mg via INTRAVENOUS

## 2020-08-06 MED ORDER — SACCHAROMYCES BOULARDII 250 MG PO CAPS
250.0000 mg | ORAL_CAPSULE | Freq: Two times a day (BID) | ORAL | Status: DC
  Administered 2020-08-06 – 2020-08-11 (×9): 250 mg via ORAL
  Filled 2020-08-06 (×10): qty 1

## 2020-08-06 MED ORDER — PROPOFOL 10 MG/ML IV BOLUS
INTRAVENOUS | Status: DC | PRN
  Administered 2020-08-06: 100 mg via INTRAVENOUS

## 2020-08-06 MED ORDER — DIPHENHYDRAMINE HCL 50 MG/ML IJ SOLN: 12.5000 mg | Freq: Four times a day (QID) | INTRAMUSCULAR | Status: DC | PRN

## 2020-08-06 MED ORDER — OXYCODONE HCL 5 MG/5ML PO SOLN: 5.0000 mg | Freq: Once | ORAL | Status: DC | PRN

## 2020-08-06 MED ORDER — DEXAMETHASONE SODIUM PHOSPHATE 10 MG/ML IJ SOLN
INTRAMUSCULAR | Status: AC
  Filled 2020-08-06: qty 1

## 2020-08-06 MED ORDER — LACTATED RINGERS IV SOLN: INTRAVENOUS | Status: DC | PRN

## 2020-08-06 MED ORDER — PHENYLEPHRINE HCL-NACL 10-0.9 MG/250ML-% IV SOLN
INTRAVENOUS | Status: DC | PRN
  Administered 2020-08-06: 40 ug/min via INTRAVENOUS

## 2020-08-06 MED ORDER — BUPIVACAINE-EPINEPHRINE 0.25% -1:200000 IJ SOLN
INTRAMUSCULAR | Status: DC | PRN
  Administered 2020-08-06: 30 mL

## 2020-08-06 MED ORDER — MEPERIDINE HCL 50 MG/ML IJ SOLN: 6.2500 mg | INTRAMUSCULAR | Status: DC | PRN

## 2020-08-06 MED ORDER — ENOXAPARIN SODIUM 30 MG/0.3ML IJ SOSY
30.0000 mg | PREFILLED_SYRINGE | INTRAMUSCULAR | Status: DC
  Administered 2020-08-07 – 2020-08-11 (×5): 30 mg via SUBCUTANEOUS
  Filled 2020-08-06 (×5): qty 0.3

## 2020-08-06 MED ORDER — OXYCODONE HCL 5 MG PO TABS: 5.0000 mg | ORAL_TABLET | Freq: Once | ORAL | Status: DC | PRN

## 2020-08-06 MED ORDER — ORAL CARE MOUTH RINSE: 15.0000 mL | Freq: Once | OROMUCOSAL | Status: AC

## 2020-08-06 MED ORDER — DEXAMETHASONE SODIUM PHOSPHATE 10 MG/ML IJ SOLN
INTRAMUSCULAR | Status: DC | PRN
  Administered 2020-08-06: 4 mg via INTRAVENOUS

## 2020-08-06 MED ORDER — ACETAMINOPHEN 500 MG PO TABS
1000.0000 mg | ORAL_TABLET | ORAL | Status: AC
  Administered 2020-08-06: 1000 mg via ORAL
  Filled 2020-08-06: qty 2

## 2020-08-06 MED ORDER — SIMETHICONE 80 MG PO CHEW: 40.0000 mg | CHEWABLE_TABLET | Freq: Four times a day (QID) | ORAL | Status: DC | PRN

## 2020-08-06 MED ORDER — ROCURONIUM BROMIDE 10 MG/ML (PF) SYRINGE
PREFILLED_SYRINGE | INTRAVENOUS | Status: DC | PRN
  Administered 2020-08-06: 100 mg via INTRAVENOUS

## 2020-08-06 MED ORDER — ALVIMOPAN 12 MG PO CAPS
12.0000 mg | ORAL_CAPSULE | ORAL | Status: AC
  Administered 2020-08-06: 12 mg via ORAL
  Filled 2020-08-06: qty 1

## 2020-08-06 MED ORDER — ROCURONIUM BROMIDE 10 MG/ML (PF) SYRINGE
PREFILLED_SYRINGE | INTRAVENOUS | Status: AC
  Filled 2020-08-06: qty 10

## 2020-08-06 MED ORDER — ONDANSETRON HCL 4 MG/2ML IJ SOLN
INTRAMUSCULAR | Status: DC | PRN
  Administered 2020-08-06: 4 mg via INTRAVENOUS

## 2020-08-06 MED ORDER — ONDANSETRON HCL 4 MG/2ML IJ SOLN
4.0000 mg | Freq: Four times a day (QID) | INTRAMUSCULAR | Status: DC | PRN
  Filled 2020-08-06: qty 2

## 2020-08-06 MED ORDER — LIDOCAINE 2% (20 MG/ML) 5 ML SYRINGE
INTRAMUSCULAR | Status: AC
  Filled 2020-08-06: qty 5

## 2020-08-06 MED ORDER — EPHEDRINE SULFATE-NACL 50-0.9 MG/10ML-% IV SOSY
PREFILLED_SYRINGE | INTRAVENOUS | Status: DC | PRN
  Administered 2020-08-06: 5 mg via INTRAVENOUS

## 2020-08-06 MED ORDER — SPY AGENT GREEN - (INDOCYANINE FOR INJECTION)
INTRAMUSCULAR | Status: DC | PRN
  Administered 2020-08-06: 3 mL via INTRAVENOUS

## 2020-08-06 MED ORDER — GABAPENTIN 300 MG PO CAPS
300.0000 mg | ORAL_CAPSULE | ORAL | Status: AC
  Administered 2020-08-06: 300 mg via ORAL
  Filled 2020-08-06: qty 1

## 2020-08-06 MED ORDER — ACETAMINOPHEN 325 MG PO TABS: 325.0000 mg | ORAL_TABLET | ORAL | Status: DC | PRN

## 2020-08-06 MED ORDER — BUPIVACAINE LIPOSOME 1.3 % IJ SUSP
INTRAMUSCULAR | Status: DC | PRN
  Administered 2020-08-06: 20 mL

## 2020-08-06 MED ORDER — 0.9 % SODIUM CHLORIDE (POUR BTL) OPTIME
TOPICAL | Status: DC | PRN
  Administered 2020-08-06: 1000 mL

## 2020-08-06 MED ORDER — PROPOFOL 10 MG/ML IV BOLUS
INTRAVENOUS | Status: AC
  Filled 2020-08-06: qty 20

## 2020-08-06 MED ORDER — FENTANYL CITRATE (PF) 100 MCG/2ML IJ SOLN: 25.0000 ug | INTRAMUSCULAR | Status: DC | PRN

## 2020-08-06 MED ORDER — ACETAMINOPHEN 160 MG/5ML PO SOLN: 325.0000 mg | ORAL | Status: DC | PRN

## 2020-08-06 MED ORDER — ENSURE SURGERY PO LIQD
237.0000 mL | Freq: Two times a day (BID) | ORAL | Status: DC
  Administered 2020-08-07: 237 mL via ORAL

## 2020-08-06 MED ORDER — ACETAMINOPHEN 500 MG PO TABS
1000.0000 mg | ORAL_TABLET | Freq: Four times a day (QID) | ORAL | Status: DC
  Administered 2020-08-06 – 2020-08-11 (×12): 1000 mg via ORAL
  Filled 2020-08-06 (×16): qty 2

## 2020-08-06 MED ORDER — LIDOCAINE 2% (20 MG/ML) 5 ML SYRINGE
INTRAMUSCULAR | Status: DC | PRN
  Administered 2020-08-06: 40 mg via INTRAVENOUS

## 2020-08-06 MED ORDER — GLYCOPYRROLATE PF 0.2 MG/ML IJ SOSY
PREFILLED_SYRINGE | INTRAMUSCULAR | Status: DC | PRN
  Administered 2020-08-06: .2 mg via INTRAVENOUS

## 2020-08-06 MED ORDER — ONDANSETRON HCL 4 MG/2ML IJ SOLN
4.0000 mg | Freq: Once | INTRAMUSCULAR | Status: DC | PRN
Start: 2020-08-06 — End: 2020-08-06

## 2020-08-06 MED ORDER — HYDROMORPHONE HCL 1 MG/ML IJ SOLN
0.5000 mg | INTRAMUSCULAR | Status: DC | PRN
Start: 2020-08-06 — End: 2020-08-11
  Administered 2020-08-06 – 2020-08-11 (×4): 0.5 mg via INTRAVENOUS
  Filled 2020-08-06 (×4): qty 0.5

## 2020-08-06 MED ORDER — TRAMADOL HCL 50 MG PO TABS
50.0000 mg | ORAL_TABLET | Freq: Two times a day (BID) | ORAL | Status: DC | PRN
  Administered 2020-08-07 – 2020-08-10 (×3): 50 mg via ORAL
  Filled 2020-08-06 (×3): qty 1

## 2020-08-06 MED ORDER — BUPIVACAINE-EPINEPHRINE (PF) 0.25% -1:200000 IJ SOLN
INTRAMUSCULAR | Status: AC
  Filled 2020-08-06: qty 30

## 2020-08-06 MED ORDER — FENTANYL CITRATE (PF) 100 MCG/2ML IJ SOLN
INTRAMUSCULAR | Status: DC | PRN
  Administered 2020-08-06: 50 ug via INTRAVENOUS
  Administered 2020-08-06 (×4): 25 ug via INTRAVENOUS

## 2020-08-06 MED ORDER — MIDAZOLAM HCL 2 MG/2ML IJ SOLN
INTRAMUSCULAR | Status: AC
  Filled 2020-08-06: qty 2

## 2020-08-06 MED ORDER — ONDANSETRON HCL 4 MG PO TABS
4.0000 mg | ORAL_TABLET | Freq: Four times a day (QID) | ORAL | Status: DC | PRN
  Administered 2020-08-09: 4 mg via ORAL
  Filled 2020-08-06: qty 1

## 2020-08-06 MED ORDER — ONDANSETRON HCL 4 MG/2ML IJ SOLN
INTRAMUSCULAR | Status: AC
  Filled 2020-08-06: qty 2

## 2020-08-06 MED ORDER — DIPHENHYDRAMINE HCL 12.5 MG/5ML PO ELIX: 12.5000 mg | ORAL_SOLUTION | Freq: Four times a day (QID) | ORAL | Status: DC | PRN

## 2020-08-06 MED ORDER — TIMOLOL MALEATE 0.5 % OP SOLN
1.0000 [drp] | Freq: Two times a day (BID) | OPHTHALMIC | Status: DC
  Administered 2020-08-06 – 2020-08-11 (×10): 1 [drp] via OPHTHALMIC
  Filled 2020-08-06: qty 5

## 2020-08-06 MED ORDER — LACTATED RINGERS IR SOLN
Status: DC | PRN
  Administered 2020-08-06: 1000 mL

## 2020-08-06 MED ORDER — ENSURE PRE-SURGERY PO LIQD
592.0000 mL | Freq: Once | ORAL | Status: DC
  Filled 2020-08-06: qty 592

## 2020-08-06 SURGICAL SUPPLY — 88 items
BAG COUNTER SPONGE SURGICOUNT (BAG) ×2 IMPLANT
BAG SPNG CNTER NS LX DISP (BAG) ×1
BLADE EXTENDED COATED 6.5IN (ELECTRODE) IMPLANT
CANNULA REDUC XI 12-8 STAPL (CANNULA)
CANNULA REDUCER 12-8 DVNC XI (CANNULA) IMPLANT
CELLS DAT CNTRL 66122 CELL SVR (MISCELLANEOUS) IMPLANT
COVER SURGICAL LIGHT HANDLE (MISCELLANEOUS) ×4 IMPLANT
COVER TIP SHEARS 8 DVNC (MISCELLANEOUS) ×1 IMPLANT
COVER TIP SHEARS 8MM DA VINCI (MISCELLANEOUS) ×1
DECANTER SPIKE VIAL GLASS SM (MISCELLANEOUS) IMPLANT
DRAIN CHANNEL 19F RND (DRAIN) IMPLANT
DRAPE ARM DVNC X/XI (DISPOSABLE) ×4 IMPLANT
DRAPE COLUMN DVNC XI (DISPOSABLE) ×1 IMPLANT
DRAPE DA VINCI XI ARM (DISPOSABLE) ×4
DRAPE DA VINCI XI COLUMN (DISPOSABLE) ×1
DRAPE SURG IRRIG POUCH 19X23 (DRAPES) ×2 IMPLANT
DRSG OPSITE POSTOP 4X10 (GAUZE/BANDAGES/DRESSINGS) IMPLANT
DRSG OPSITE POSTOP 4X6 (GAUZE/BANDAGES/DRESSINGS) ×2 IMPLANT
DRSG OPSITE POSTOP 4X8 (GAUZE/BANDAGES/DRESSINGS) IMPLANT
ELECT PENCIL ROCKER SW 15FT (MISCELLANEOUS) ×2 IMPLANT
ELECT REM PT RETURN 15FT ADLT (MISCELLANEOUS) ×2 IMPLANT
ENDOLOOP SUT PDS II  0 18 (SUTURE)
ENDOLOOP SUT PDS II 0 18 (SUTURE) IMPLANT
EVACUATOR SILICONE 100CC (DRAIN) IMPLANT
GLOVE SURG ENC MOIS LTX SZ6.5 (GLOVE) ×6 IMPLANT
GLOVE SURG UNDER POLY LF SZ7 (GLOVE) ×4 IMPLANT
GOWN STRL REUS W/TWL XL LVL3 (GOWN DISPOSABLE) ×6 IMPLANT
GRASPER SUT TROCAR 14GX15 (MISCELLANEOUS) IMPLANT
HOLDER FOLEY CATH W/STRAP (MISCELLANEOUS) ×2 IMPLANT
IRRIG SUCT STRYKERFLOW 2 WTIP (MISCELLANEOUS) ×2
IRRIGATION SUCT STRKRFLW 2 WTP (MISCELLANEOUS) ×1 IMPLANT
KIT PROCEDURE DA VINCI SI (MISCELLANEOUS)
KIT PROCEDURE DVNC SI (MISCELLANEOUS) IMPLANT
KIT TURNOVER KIT A (KITS) ×2 IMPLANT
NEEDLE INSUFFLATION 14GA 120MM (NEEDLE) ×2 IMPLANT
PACK CARDIOVASCULAR III (CUSTOM PROCEDURE TRAY) ×2 IMPLANT
PACK COLON (CUSTOM PROCEDURE TRAY) ×2 IMPLANT
PAD POSITIONING PINK XL (MISCELLANEOUS) ×2 IMPLANT
PORT LAP GEL ALEXIS MED 5-9CM (MISCELLANEOUS) IMPLANT
RELOAD STAPLER 2.5X60 WHT DVNC (STAPLE) ×1 IMPLANT
RELOAD STAPLER 3.5X60 BLU DVNC (STAPLE) ×2 IMPLANT
RELOAD STAPLER 4.3X60 GRN DVNC (STAPLE) IMPLANT
RTRCTR WOUND ALEXIS 18CM MED (MISCELLANEOUS)
SCISSORS LAP 5X35 DISP (ENDOMECHANICALS) IMPLANT
SEAL CANN UNIV 5-8 DVNC XI (MISCELLANEOUS) ×3 IMPLANT
SEAL XI 5MM-8MM UNIVERSAL (MISCELLANEOUS) ×3
SEALER VESSEL DA VINCI XI (MISCELLANEOUS) ×1
SEALER VESSEL EXT DVNC XI (MISCELLANEOUS) ×1 IMPLANT
SOLUTION ELECTROLUBE (MISCELLANEOUS) ×2 IMPLANT
STAPLER 60 DA VINCI SURE FORM (STAPLE) ×1
STAPLER 60 SUREFORM DVNC (STAPLE) ×1 IMPLANT
STAPLER CANNULA SEAL DVNC XI (STAPLE) IMPLANT
STAPLER CANNULA SEAL XI (STAPLE)
STAPLER ECHELON POWER CIR 29 (STAPLE) IMPLANT
STAPLER ECHELON POWER CIR 31 (STAPLE) IMPLANT
STAPLER RELOAD 2.5X60 WHITE (STAPLE) ×1
STAPLER RELOAD 2.5X60 WHT DVNC (STAPLE) ×1
STAPLER RELOAD 3.5X60 BLU DVNC (STAPLE) ×2
STAPLER RELOAD 3.5X60 BLUE (STAPLE) ×2
STAPLER RELOAD 4.3X60 GREEN (STAPLE)
STAPLER RELOAD 4.3X60 GRN DVNC (STAPLE)
STOPCOCK 4 WAY LG BORE MALE ST (IV SETS) ×4 IMPLANT
SUT ETHILON 2 0 PS N (SUTURE) IMPLANT
SUT NOVA NAB DX-16 0-1 5-0 T12 (SUTURE) ×4 IMPLANT
SUT PROLENE 2 0 KS (SUTURE) IMPLANT
SUT SILK 2 0 (SUTURE) ×2
SUT SILK 2 0 SH CR/8 (SUTURE) IMPLANT
SUT SILK 2-0 18XBRD TIE 12 (SUTURE) ×1 IMPLANT
SUT SILK 3 0 (SUTURE)
SUT SILK 3 0 SH CR/8 (SUTURE) ×2 IMPLANT
SUT SILK 3-0 18XBRD TIE 12 (SUTURE) IMPLANT
SUT V-LOC BARB 180 2/0GR6 GS22 (SUTURE)
SUT VIC AB 2-0 SH 18 (SUTURE) IMPLANT
SUT VIC AB 2-0 SH 27 (SUTURE)
SUT VIC AB 2-0 SH 27X BRD (SUTURE) IMPLANT
SUT VIC AB 3-0 SH 18 (SUTURE) IMPLANT
SUT VIC AB 4-0 PS2 27 (SUTURE) ×4 IMPLANT
SUT VICRYL 0 UR6 27IN ABS (SUTURE) ×2 IMPLANT
SUTURE V-LC BRB 180 2/0GR6GS22 (SUTURE) IMPLANT
SYR 10ML ECCENTRIC (SYRINGE) ×2 IMPLANT
SYS LAPSCP GELPORT 120MM (MISCELLANEOUS)
SYSTEM LAPSCP GELPORT 120MM (MISCELLANEOUS) IMPLANT
TOWEL OR 17X26 10 PK STRL BLUE (TOWEL DISPOSABLE) IMPLANT
TOWEL OR NON WOVEN STRL DISP B (DISPOSABLE) ×2 IMPLANT
TRAY FOLEY MTR SLVR 16FR STAT (SET/KITS/TRAYS/PACK) ×2 IMPLANT
TROCAR ADV FIXATION 5X100MM (TROCAR) ×2 IMPLANT
TUBING CONNECTING 10 (TUBING) ×4 IMPLANT
TUBING INSUFFLATION 10FT LAP (TUBING) ×2 IMPLANT

## 2020-08-06 NOTE — H&P (Signed)
The patient is a 80 year old male who presents with colorectal cancer. 80 year old male who presents to the office today for ongoing discussion of his synchronous colon cancers.  He was hospitalized in April of 2021 due to severe anemia.  He was transfused and noted to have iron deficiency anemia.  Colonoscopy and EGD were performed.  Colonoscopy showed a proximal transverse colon mass which was biopsied and sigmoid colon polyp which was removed.  Both lesions were tattooed.  Both lesions were noted to have adenocarcinoma on final pathology.  CT scans of the chest, abdomen and pelvis showed no signs of metastatic disease.  CEA level was normal.  Patient has no past surgical history.  He has a history of a CVA in 2017 with no residual weakness.  He has chronic kidney disease with a baseline creatinine around 2-3.  Patient has a history of atrial fibrillation, but is currently in sinus rhythm according to him.  He is taking low dose Elliquis for his stroke.  I saw him back in May and Sept and Jan and recommended colectomy.  I think it is reasonable to proceed with right colectomy at this time and continue to monitor his sigmoid colon. His cardiologist has evaluated him in the past and deemed him high risk with approximately 11% risk of perioperative cardiac complications.      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 Colon Polyp Removal - Colonoscopy  Sentinel Lymph Node Biopsy    Diagnostic Studies History Leighton Ruff, MD; 0/30/0923 10:59 AM) Colonoscopy  within last year   Allergies Janeann Forehand, CNA; 05/25/2020 10:43 AM) Penicillins  Allergies Reconciled    Medication History Leighton Ruff, MD; 3/00/7622 10:59 AM) Atorvastatin Calcium  (40MG  Tablet, Oral) Active. Timolol Maleate   (0.25% Solution, Ophthalmic) Active. Vitamin D (Cholecalciferol)  (Oral) Specific strength unknown - Active. Iron  (325 (65 Fe)MG Tablet, Oral) Active. Multi-Vitamin  (Oral) Active. Aczone  (7.5% Gel, External) Active. Dapsone  (5% Gel, External) Active. Triamcinolone Acetonide (Top)  (0.1% Cream, External) Active.    Social History Leighton Ruff, MD; 6/33/3545 10:59 AM) Alcohol use  Occasional alcohol use. Caffeine use  Carbonated beverages, Coffee, Tea. No drug use  Tobacco use  Former smoker.   Family History Leighton Ruff, MD; 07/11/6387 10:59 AM) Diabetes Mellitus  Mother. Heart Disease  Father. Heart disease in male family member before age 24  Respiratory Condition  Father, Mother.   Other Problems Leighton Ruff, MD; 3/73/4287 10:59 AM) Atrial Fibrillation  Cerebrovascular Accident  Chronic Renal Failure Syndrome  Gastroesophageal Reflux Disease  Melanoma          Review of Systems  General Not Present- Appetite Loss, Chills, Fatigue, Fever, Night Sweats, Weight Gain and Weight Loss. Skin Present- Non-Healing Wounds. Not Present- Change in Wart/Mole, Dryness, Hives, Jaundice, New Lesions, Rash and Ulcer. HEENT Present- Hearing Loss. Not Present- Earache, Hoarseness, Nose Bleed, Oral Ulcers, Ringing in the Ears, Seasonal Allergies, Sinus Pain, Sore Throat, Visual Disturbances, Wears glasses/contact lenses and Yellow Eyes. Respiratory Not Present- Bloody sputum, Chronic Cough, Difficulty Breathing, Snoring and Wheezing. Breast Not Present- Breast Mass, Breast Pain, Nipple Discharge and Skin Changes. Cardiovascular Not Present- Chest Pain, Difficulty Breathing Lying Down, Leg Cramps, Palpitations, Rapid Heart Rate, Shortness of Breath and Swelling of Extremities. Gastrointestinal Not Present- Abdominal Pain, Bloating, Bloody Stool,  Change in Bowel Habits, Chronic diarrhea, Constipation, Difficulty Swallowing, Excessive gas, Gets full quickly at meals, Hemorrhoids,  Indigestion, Nausea, Rectal Pain and Vomiting. Male Genitourinary Not Present- Blood in Urine, Change in Urinary Stream, Frequency, Impotence, Nocturia, Painful Urination, Urgency and Urine Leakage. Musculoskeletal Not Present- Back Pain, Joint Pain, Joint Stiffness, Muscle Pain, Muscle Weakness and Swelling of Extremities. Neurological Not Present- Decreased Memory, Fainting, Headaches, Numbness, Seizures, Tingling, Tremor, Trouble walking and Weakness. Psychiatric Not Present- Anxiety, Bipolar, Change in Sleep Pattern, Depression, Fearful and Frequent crying. Endocrine Not Present- Cold Intolerance, Excessive Hunger, Hair Changes, Heat Intolerance and New Diabetes. Hematology Present- Easy Bruising. Not Present- Blood Thinners, Excessive bleeding, Gland problems, HIV and Persistent Infections.     Physical Exam Leighton Ruff MD; 6/70/1410 10:59 AM)   General Mental Status - Alert. General Appearance - Cooperative.   Abdomen Palpation/Percussion Palpation and Percussion of the abdomen reveal - Soft and Non Tender. Note: no masses noted.       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 He has a proximal transverse colon cancer. He feels he is ready for surgery now. He is aware he is at high risk for coronary artery complications during and after surgery. His most recent CT scan shows no signs of unresectable disease. We will proceed with robotic-assisted right colectomy. I have modified his bowel prep, to help avoid kidney injuries due to dehydration and we have recommended that he drink plenty of fluids during his prep. He did have a malignant polyp in the sigmoid colon resected. We have decided to just watch this for right now, given his frail condition. The surgery and anatomy were described to the patient as well as the risks of surgery  and the possible complications. These include: Bleeding, deep abdominal infections and possible wound complications such as hernia and infection, damage to adjacent structures, leak of surgical connections, which can lead to other surgeries and possibly an ostomy, possible need for other procedures, such as abscess drains in radiology, possible prolonged hospital stay, possible diarrhea from removal of part of the colon, possible constipation from narcotics, possible bowel, bladder or sexual dysfunction if having rectal surgery, prolonged fatigue/weakness or appetite loss, possible early recurrence of of disease, possible complications of their medical problems such as heart disease or arrhythmias or lung problems, death (less than 1%). I believe the patient understands and wishes to proceed with the surgery.

## 2020-08-06 NOTE — Op Note (Signed)
08/06/2020  1:45 PM  PATIENT:  Keith [name]  80 y.o. male  Patient Care Team: Rise Patience, DO as PCP - General (Family Medicine) [name], MD as PCP - Electrophysiology (Cardiology) Nwobu, Lyman Bishop, MD as Consulting Physician [facility] Irene Limbo, MD as Consulting Physician (Plastic Surgery) Izora Ribas (Dermatology) Danella Sensing, MD as Consulting Physician (Dermatology) [name], MD as Consulting Physician (Oncology) Sherran Needs, NP as Nurse Practitioner (Nurse Practitioner) Hassell Done, Dicky Doe, NP as Nurse Practitioner (Neurology) Frann Rider, NP as Nurse Practitioner (Neurology)  PRE-OPERATIVE DIAGNOSIS:  colon cancer  POST-OPERATIVE DIAGNOSIS:  Transverse colon cancer  PROCEDURE:   XI ROBOT ASSISTED RIGHT COLECTOMY    Surgeon(s): Leighton Ruff, MD Michael Boston, MD  ASSISTANT: Dr Johney Maine   ANESTHESIA:   local and general  EBL: 39ml Total I/O In: 500 [I.V.:500] Out: 500 [Urine:450; Blood:50]  Delay start of Pharmacological VTE agent (>24hrs) due to surgical blood loss or risk of bleeding:  no  DRAINS: none   SPECIMEN:  Source of Specimen:  R colon  DISPOSITION OF SPECIMEN:  PATHOLOGY  COUNTS:  YES  PLAN OF CARE: Admit to inpatient   PATIENT DISPOSITION:  PACU - hemodynamically stable.  INDICATION:    80 y.o. M with proximal transverse colon cancer and a colon cancer noted in the sigmoid polyp which was resected endoscopically.  I recommended segmental resection of the right colon with close endoscopic observation of the sigmoid colon:  The anatomy & physiology of the digestive tract was discussed.  The pathophysiology was discussed.  Natural history risks without surgery was discussed.   I worked to give an overview of the disease and the frequent need to have multispecialty involvement.  I feel the risks of no intervention will lead to serious problems that outweigh the operative risks; therefore,  I recommended a partial colectomy to remove the pathology.  Laparoscopic & open techniques were discussed.   Risks such as bleeding, infection, abscess, leak, reoperation, possible ostomy, hernia, heart attack, death, and other risks were discussed.  I noted a good likelihood this will help address the problem.   Goals of post-operative recovery were discussed as well.    The patient expressed understanding & wished to proceed with surgery.  OR FINDINGS:   Patient had mass noted in the proximal transverse colon with tattoo distally  No obvious metastatic disease on visceral parietal peritoneum or liver.   DESCRIPTION:   Informed consent was confirmed.  The patient underwent general anaesthesia without difficulty.  The patient was positioned appropriately.  VTE prevention in place.  The patient's abdomen was clipped, prepped, & draped in a sterile fashion.  Surgical timeout confirmed our plan.  The patient was positioned in reverse Trendelenburg.  Abdominal entry was gained using a varies needle in the left upper quadrant.  Entry was clean.  I induced carbon dioxide insufflation.  An 43mm robotic port was placed in the left upper quadrant.  Camera inspection revealed no injury.  Extra ports were carefully placed under direct laparoscopic visualization.  I laparoscopically reflected the greater omentum and the upper abdomen and the small bowel in the pelvis. The patient was appropriately positioned and the robot was docked to the patient's right side.  Instruments were placed under direct visualization.   There was tattoo noted in the proximal transverse colon.  There was a mass proximal to this tattoo.  I identified the tattoo in the sigmoid colon as well.  There was no obvious mass  associated with this tattoo.  I began by identifying the ileocolic artery and vein within the mesentery. Dissection was bluntly carried around these structures. The duodenum was identified and free from the structures. I  then separated the structures bluntly and used the robotic vessel sealer device to transect these separately.  I developed the retroperitoneal plane bluntly.  I then freed the appendix off its attachments to the pelvic wall. I mobilized the terminal ileum.  This was adherent into the pelvis due to previous surgery.  The patient was placed in Trendelenburg and we were able to visualize these adhesions and take these down using the robotic vessel sealer.  This allowed mobilization of the terminal ileum into the upper abdomen.  I took care to avoid injuring any retroperitoneal structures.  After this I began to mobilize laterally down the white line of Toldt and then took down the hepatic flexure using the robotic vessel sealer device. I mobilized the omentum off of the right transverse colon. The entire colon was then flipped medially and mobilized off of the retroperitoneal structures until I could visualize the lateral edge of the duodenum underneath.  I gently freed the duodenal attachments.  At that point, I divided the mesentery of the transverse colon to the level of the most distal portion of the tattoo using the robotic vessel sealer.  I also divided the mesentery of the terminal ileum.  I then divided the terminal ileum and transverse colon just distal to the tattoo with a robotic 60 mm blue load stapler.  We then mobilized the entire colon up over the liver to allow for room for the anastomosis.  At this point firefly was injected intravascularly for intraoperative assessment of perfusion.  There was good perfusion noted to the terminal ileum and transverse colon at the resected sites.  The terminal ileum was arranged in a isoperistaltic static manner around the transverse colon.  A small enterotomy was made using robotic scissors and the colon and terminal ileum.  A white load stapler was used to create an anastomosis between the 2 limbs.  Hemostasis was good at the staple line.  The common enterotomy was  then closed using 2, 2 OV lock sutures in a Connel fashion.  The omentum was brought down over this and the abdomen was inspected.  The right upper quadrant was irrigated.  Hemostasis was good.  Four-quadrant inspection of the abdomen revealed no signs of injury or bleeding.  The robot was undocked.  The 12 mm suprapubic port was enlarged into a Pfannenstiel incision.  An Barstow wound protector was placed.  The colon was removed from the abdomen and sent to pathology for further examination. The fascia was then closed using running #1 Novafil sutures.  The subcutaneous tissue of the extraction incision was closed using a running 2-0 Vicryl suture. The skin was then closed using 4-0 Vicryl sutures. Dermabond was placed on the port sites and a sterile dressing was placed over the abdominal incision. All counts were correct per operating room staff. The patient was then awakened from anesthesia and sent to the post anesthesia care unit in stable condition.

## 2020-08-06 NOTE — Anesthesia Postprocedure Evaluation (Signed)
Anesthesia Post Note  Patient: Keith [name]  Procedure(s) Performed: XI ROBOT ASSISTED RIGHT COLECTOMY (Abdomen)     Patient location during evaluation: PACU Anesthesia Type: General Level of consciousness: awake and alert Pain management: pain level controlled Vital Signs Assessment: post-procedure vital signs reviewed and stable Respiratory status: spontaneous breathing, nonlabored ventilation, respiratory function stable and patient connected to nasal cannula oxygen Cardiovascular status: blood pressure returned to baseline and stable Postop Assessment: no apparent nausea or vomiting Anesthetic complications: no   No notable events documented.  Last Vitals:  Vitals:   08/06/20 1356 08/06/20 1400  BP: (!) 132/57 (!) 132/49  Pulse: 71 69  Resp: 12 10  Temp: (!) 36.4 C   SpO2: 100% 100%    Last Pain:  Vitals:   08/06/20 1356  PainSc: 0-No pain                 Dusty Wagoner

## 2020-08-06 NOTE — Anesthesia Procedure Notes (Signed)
Procedure Name: Intubation Date/Time: 08/06/2020 12:00 PM Performed by: Lavina Hamman, CRNA Pre-anesthesia Checklist: Patient identified, Emergency Drugs available, Suction available, Patient being monitored and Timeout performed Patient Re-evaluated:Patient Re-evaluated prior to induction Oxygen Delivery Method: Circle system utilized Preoxygenation: Pre-oxygenation with 100% oxygen Induction Type: IV induction Ventilation: Mask ventilation without difficulty Laryngoscope Size: Mac and 4 Grade View: Grade I Tube type: Oral Tube size: 6.5 mm Number of attempts: 1 Airway Equipment and Method: Stylet Placement Confirmation: ETT inserted through vocal cords under direct vision, positive ETCO2, CO2 detector and breath sounds checked- equal and bilateral Secured at: 21 cm Tube secured with: Tape Dental Injury: Teeth and Oropharynx as per pre-operative assessment

## 2020-08-06 NOTE — Transfer of Care (Signed)
Immediate Anesthesia Transfer of Care Note  Patient: Keith [name]  Procedure(s) Performed: Procedure(s): XI ROBOT ASSISTED RIGHT COLECTOMY (N/A)  Patient Location: PACU  Anesthesia Type:General  Level of Consciousness:  sedated, patient cooperative and responds to stimulation  Airway & Oxygen Therapy:Patient Spontanous Breathing and Patient connected to face mask oxgen  Post-op Assessment:  Report given to PACU RN and Post -op Vital signs reviewed and stable  Post vital signs:  Reviewed and stable  Last Vitals:  Vitals:   08/06/20 0924  BP: (!) 132/54  Pulse: (!) 57  SpO2: 301%    Complications: No apparent anesthesia complications

## 2020-08-07 LAB — CBC
HCT: 27.7 % — ABNORMAL LOW (ref 39.0–52.0)
HCT: 26.9 % — ABNORMAL LOW (ref 39.0–52.0)
Hemoglobin: 8.6 g/dL — ABNORMAL LOW (ref 13.0–17.0)
Hemoglobin: 8.4 g/dL — ABNORMAL LOW (ref 13.0–17.0)
MCH: 28.6 pg (ref 26.0–34.0)
MCH: 28.8 pg (ref 26.0–34.0)
MCHC: 31 g/dL (ref 30.0–36.0)
MCHC: 31.2 g/dL (ref 30.0–36.0)
MCV: 92 fL (ref 80.0–100.0)
MCV: 92.1 fL (ref 80.0–100.0)
Platelets: 161 10*3/uL (ref 150–400)
Platelets: 178 10*3/uL (ref 150–400)
RBC: 3.01 MIL/uL — ABNORMAL LOW (ref 4.22–5.81)
RBC: 2.92 MIL/uL — ABNORMAL LOW (ref 4.22–5.81)
RDW: 13.9 % (ref 11.5–15.5)
RDW: 14.2 % (ref 11.5–15.5)
WBC: 8.7 10*3/uL (ref 4.0–10.5)
WBC: 9.3 10*3/uL (ref 4.0–10.5)
nRBC: 0 % (ref 0.0–0.2)
nRBC: 0 % (ref 0.0–0.2)

## 2020-08-07 LAB — BASIC METABOLIC PANEL
Anion gap: 5 (ref 5–15)
BUN: 18 mg/dL (ref 8–23)
CO2: 23 mmol/L (ref 22–32)
Calcium: 8.6 mg/dL — ABNORMAL LOW (ref 8.9–10.3)
Chloride: 109 mmol/L (ref 98–111)
Creatinine, Ser: 2.36 mg/dL — ABNORMAL HIGH (ref 0.61–1.24)
GFR, Estimated: 27 mL/min — ABNORMAL LOW (ref >60)
Glucose, Bld: 132 mg/dL — ABNORMAL HIGH (ref 70–99)
Potassium: 4.6 mmol/L (ref 3.5–5.1)
Sodium: 137 mmol/L (ref 135–145)

## 2020-08-07 LAB — TROPONIN I (HIGH SENSITIVITY): Troponin I (High Sensitivity): 4 ng/L (ref <18)

## 2020-08-07 NOTE — Progress Notes (Signed)
Dr. Marlou Starks aware via phone pt has voided since foley out this am. Bladder scan showed 101 ml in bladder. MD ordered to reinsert foley catheter.

## 2020-08-07 NOTE — Progress Notes (Signed)
1 Day Post-Op   Subjective/Chief Complaint: Chest pain earlier now resolved No abdominal pain Denies SOB   Objective: Vital signs in last 24 hours: Temp:  [97.2 F (36.2 C)-97.8 F (36.6 C)] 97.7 F (36.5 C) (07/23 0947) Pulse Rate:  [46-71] 48 (07/23 0947) Resp:  [10-18] 14 (07/23 0947) BP: (91-132)/(44-70) 97/56 (07/23 0947) SpO2:  [96 %-100 %] 99 % (07/23 0947) Weight:  [48.1 kg] 48.1 kg (07/22 1603) Last BM Date: 08/06/20  Intake/Output from previous day: 07/22 0701 - 07/23 0700 In: 1030.8 [I.V.:1030.8] Out: 1050 [Urine:1000; Blood:50] Intake/Output this shift: Total I/O In: 78 [P.O.:60] Out: 0   Exam: Awake and alert No resp distress CV brady Abdomen soft  Lab Results:  Recent Labs    08/07/20 0350  WBC 8.7  HGB 8.6*  HCT 27.7*  PLT 161   BMET Recent Labs    08/07/20 0350  NA 137  K 4.6  CL 109  CO2 23  GLUCOSE 132*  BUN 18  CREATININE 2.36*  CALCIUM 8.6*   PT/INR No results for input(s): LABPROT, INR in the last 72 hours. ABG No results for input(s): PHART, HCO3 in the last 72 hours.  Invalid input(s): PCO2, PO2  Studies/Results: No results found.  Anti-infectives: Anti-infectives (From admission, onward)    Start     Dose/Rate Route Frequency Ordered Stop   08/06/20 0930  cefoTEtan (CEFOTAN) 2 g in sodium chloride 0.9 % 100 mL IVPB        2 g 200 mL/hr over 30 Minutes Intravenous On call to O.R. 08/06/20 0915 08/06/20 1130       Assessment/Plan: s/p Procedure(s): XI ROBOT ASSISTED RIGHT COLECTOMY (N/A)  Check troponin EKG shows just sinus brady Repeat CBC later today given decreased hgb  LOS: 1 day    Coralie Keens 08/07/2020

## 2020-08-07 NOTE — Progress Notes (Signed)
MD notified pt c/o of numbness in right hand and lt sided chest pain. Grips equal bilat, LE strength equal bilat, and facial symmetry. EKG revealed SB. Dr Marlou Starks ordered troponin.

## 2020-08-08 LAB — CBC
HCT: 27.2 % — ABNORMAL LOW (ref 39.0–52.0)
Hemoglobin: 8.3 g/dL — ABNORMAL LOW (ref 13.0–17.0)
MCH: 28.3 pg (ref 26.0–34.0)
MCHC: 30.5 g/dL (ref 30.0–36.0)
MCV: 92.8 fL (ref 80.0–100.0)
Platelets: 152 10*3/uL (ref 150–400)
RBC: 2.93 MIL/uL — ABNORMAL LOW (ref 4.22–5.81)
RDW: 14.5 % (ref 11.5–15.5)
WBC: 8.1 10*3/uL (ref 4.0–10.5)
nRBC: 0 % (ref 0.0–0.2)

## 2020-08-08 LAB — BASIC METABOLIC PANEL
Anion gap: 4 — ABNORMAL LOW (ref 5–15)
BUN: 19 mg/dL (ref 8–23)
CO2: 25 mmol/L (ref 22–32)
Calcium: 8.4 mg/dL — ABNORMAL LOW (ref 8.9–10.3)
Chloride: 109 mmol/L (ref 98–111)
Creatinine, Ser: 2.29 mg/dL — ABNORMAL HIGH (ref 0.61–1.24)
GFR, Estimated: 29 mL/min — ABNORMAL LOW (ref >60)
Glucose, Bld: 93 mg/dL (ref 70–99)
Potassium: 4.4 mmol/L (ref 3.5–5.1)
Sodium: 138 mmol/L (ref 135–145)

## 2020-08-08 MED ORDER — CHLORHEXIDINE GLUCONATE CLOTH 2 % EX PADS
6.0000 | MEDICATED_PAD | Freq: Every day | CUTANEOUS | Status: DC
  Administered 2020-08-08: 6 via TOPICAL

## 2020-08-08 NOTE — Progress Notes (Signed)
Pharmacy Brief Note - Alvimopan (Entereg)  The standing order set for alvimopan (Entereg) now includes an automatic order to discontinue the drug after the patient has had a bowel movement. The change was approved by the [place] and the Medical Executive Committee.   This patient has had bowel movements documented by nursing. Therefore, alvimopan has been discontinued. If there are questions, please contact the pharmacy at [phone].   Thank you-   Royetta Asal, PharmD, BCPS 08/08/2020 10:19 AM

## 2020-08-08 NOTE — Progress Notes (Signed)
2 Days Post-Op   Subjective/Chief Complaint: No complaints other than some abd soreness   Objective: Vital signs in last 24 hours: Temp:  [97.2 F (36.2 C)-98 F (36.7 C)] 98 F (36.7 C) (07/24 0649) Pulse Rate:  [47-55] 55 (07/24 0649) Resp:  [9-18] 18 (07/24 0649) BP: (97-129)/(43-64) 111/51 (07/24 0649) SpO2:  [96 %-100 %] 97 % (07/24 0649) Last BM Date: 08/07/20  Intake/Output from previous day: 07/23 0701 - 07/24 0700 In: 1430 [P.O.:830; I.V.:600] Out: 1550 [Urine:1550] Intake/Output this shift: No intake/output data recorded.  General appearance: alert and cooperative Resp: clear to auscultation bilaterally Cardio: regular rate and rhythm GI: soft, mild tenderness. Incision looks good. Good bs  Lab Results:  Recent Labs    08/07/20 0958 08/08/20 0439  WBC 9.3 8.1  HGB 8.4* 8.3*  HCT 26.9* 27.2*  PLT 178 152   BMET Recent Labs    08/07/20 0350 08/08/20 0439  NA 137 138  K 4.6 4.4  CL 109 109  CO2 23 25  GLUCOSE 132* 93  BUN 18 19  CREATININE 2.36* 2.29*  CALCIUM 8.6* 8.4*   PT/INR No results for input(s): LABPROT, INR in the last 72 hours. ABG No results for input(s): PHART, HCO3 in the last 72 hours.  Invalid input(s): PCO2, PO2  Studies/Results: No results found.  Anti-infectives: Anti-infectives (From admission, onward)    Start     Dose/Rate Route Frequency Ordered Stop   08/06/20 0930  cefoTEtan (CEFOTAN) 2 g in sodium chloride 0.9 % 100 mL IVPB        2 g 200 mL/hr over 30 Minutes Intravenous On call to O.R. 08/06/20 0915 08/06/20 1130       Assessment/Plan: s/p Procedure(s): XI ROBOT ASSISTED RIGHT COLECTOMY (N/A) Advance diet. Allow fulls today Ambulate Hg stable No more chest pain POD2  LOS: 2 days    Autumn Messing III 08/08/2020

## 2020-08-09 ENCOUNTER — Other Ambulatory Visit: Payer: Self-pay

## 2020-08-09 LAB — CBC
HCT: 33 % — ABNORMAL LOW (ref 39.0–52.0)
Hemoglobin: 10 g/dL — ABNORMAL LOW (ref 13.0–17.0)
MCH: 28.4 pg (ref 26.0–34.0)
MCHC: 30.3 g/dL (ref 30.0–36.0)
MCV: 93.8 fL (ref 80.0–100.0)
Platelets: 220 10*3/uL (ref 150–400)
RBC: 3.52 MIL/uL — ABNORMAL LOW (ref 4.22–5.81)
RDW: 14.6 % (ref 11.5–15.5)
WBC: 7.9 10*3/uL (ref 4.0–10.5)
nRBC: 0 % (ref 0.0–0.2)

## 2020-08-09 LAB — BASIC METABOLIC PANEL
Anion gap: 6 (ref 5–15)
BUN: 21 mg/dL (ref 8–23)
CO2: 23 mmol/L (ref 22–32)
Calcium: 8.9 mg/dL (ref 8.9–10.3)
Chloride: 108 mmol/L (ref 98–111)
Creatinine, Ser: 2.48 mg/dL — ABNORMAL HIGH (ref 0.61–1.24)
GFR, Estimated: 26 mL/min — ABNORMAL LOW (ref >60)
Glucose, Bld: 95 mg/dL (ref 70–99)
Potassium: 4.6 mmol/L (ref 3.5–5.1)
Sodium: 137 mmol/L (ref 135–145)

## 2020-08-09 MED ORDER — TAMSULOSIN HCL 0.4 MG PO CAPS
0.4000 mg | ORAL_CAPSULE | Freq: Every day | ORAL | Status: DC
  Administered 2020-08-09 – 2020-08-11 (×3): 0.4 mg via ORAL
  Filled 2020-08-09 (×3): qty 1

## 2020-08-09 MED ORDER — LOPERAMIDE HCL 2 MG PO CAPS
2.0000 mg | ORAL_CAPSULE | Freq: Three times a day (TID) | ORAL | Status: DC
  Administered 2020-08-09 – 2020-08-11 (×7): 2 mg via ORAL
  Filled 2020-08-09 (×7): qty 1

## 2020-08-09 NOTE — Progress Notes (Signed)
Patient is having diarrhea and does not have an appetite. He feels weak and dehydrated. The family members are concerned and would like him to have IV fluids. A message was sent to Dr. Marcello Moores.

## 2020-08-09 NOTE — Care Management Important Message (Signed)
Important Message  Patient Details IM Letter given to the Patient. Name: Keith [name] MRN: [id number] Date of Birth: 1940/05/12   Medicare Important Message Given:  Yes     Kerin Salen 08/09/2020, 1:33 PM

## 2020-08-09 NOTE — Progress Notes (Signed)
3 Days Post-Op robotic R colectomy  Subjective/Chief Complaint: No complaints other than some abd soreness, tolerating liquids, foley replaced due to urinary retention yesterday   Objective: Vital signs in last 24 hours: Temp:  [98 F (36.7 C)-99 F (37.2 C)] 98 F (36.7 C) (07/25 0523) Pulse Rate:  [54-69] 69 (07/25 0523) Resp:  [16-18] 18 (07/25 0523) BP: (112-125)/(48-62) 125/56 (07/25 0523) SpO2:  [96 %-99 %] 98 % (07/25 0523) Last BM Date: 08/09/20  Intake/Output from previous day: 07/24 0701 - 07/25 0700 In: 293.8 [P.O.:120; I.V.:173.8] Out: 950 [Urine:950] Intake/Output this shift: No intake/output data recorded.  General appearance: alert and cooperative Resp: clear to auscultation bilaterally Cardio: regular rate and rhythm GI: soft, mild tenderness. Incision looks good. Good bs  Lab Results:  Recent Labs    08/08/20 0439 08/09/20 0354  WBC 8.1 7.9  HGB 8.3* 10.0*  HCT 27.2* 33.0*  PLT 152 220    BMET Recent Labs    08/08/20 0439 08/09/20 0354  NA 138 137  K 4.4 4.6  CL 109 108  CO2 25 23  GLUCOSE 93 95  BUN 19 21  CREATININE 2.29* 2.48*  CALCIUM 8.4* 8.9    PT/INR No results for input(s): LABPROT, INR in the last 72 hours. ABG No results for input(s): PHART, HCO3 in the last 72 hours.  Invalid input(s): PCO2, PO2  Studies/Results: No results found.  Anti-infectives: Anti-infectives (From admission, onward)    Start     Dose/Rate Route Frequency Ordered Stop   08/06/20 0930  cefoTEtan (CEFOTAN) 2 g in sodium chloride 0.9 % 100 mL IVPB        2 g 200 mL/hr over 30 Minutes Intravenous On call to O.R. 08/06/20 0915 08/06/20 1130       Assessment/Plan: s/p Procedure(s): XI ROBOT ASSISTED RIGHT COLECTOMY (N/A) Advance diet to soft foods Ambulate Urinary retention: start flomax, cont foley for today   LOS: 3 days    Rosario Adie 5/72/6203

## 2020-08-10 MED ORDER — ENSURE ENLIVE PO LIQD
237.0000 mL | Freq: Three times a day (TID) | ORAL | Status: DC
  Administered 2020-08-10 – 2020-08-11 (×2): 237 mL via ORAL

## 2020-08-10 MED ORDER — CHLORHEXIDINE GLUCONATE CLOTH 2 % EX PADS
6.0000 | MEDICATED_PAD | Freq: Every day | CUTANEOUS | Status: DC
  Administered 2020-08-11: 6 via TOPICAL

## 2020-08-10 MED ORDER — ADULT MULTIVITAMIN W/MINERALS CH
1.0000 | ORAL_TABLET | Freq: Every day | ORAL | Status: DC
  Administered 2020-08-10 – 2020-08-11 (×2): 1 via ORAL
  Filled 2020-08-10 (×2): qty 1

## 2020-08-10 NOTE — Evaluation (Signed)
Physical Therapy Evaluation Patient Details Name: Keith [name] MRN: [id number] DOB: [birth year] Today's Date: 08/10/2020   History of Present Illness  Patient is 80 y.o. male s/p Rt colectomy on 08/06/20 secondary to transvere colon cancer. PMH significant for GERD, anemia, Lt MCA stroke in 2017, CKD, hypothyroidism, melanoma, Afib with loop recorder inserstion,    Clinical Impression  Keith [name] is 80 y.o. male admitted with above HPI and diagnosis. Patient is currently limited by functional impairments below (see PT problem list). Patient lives with his granddaughter and PTA was independent with no device for mobility.  Pt currently is very unsteady without external support during gait and balance is improved with bil UE support on RW. Min guard provided for safety with transfers. Patient will benefit from continued skilled PT interventions to address impairments and progress independence with mobility, recommending HHPT with assist from family 24/7 initially. Acute PT will follow and progress as able.     Follow Up Recommendations Home health PT    Equipment Recommendations  Rolling walker with 5" wheels;3in1 (PT)    Recommendations for Other Services       Precautions / Restrictions Precautions Precautions: Fall Restrictions Weight Bearing Restrictions: No      Mobility  Bed Mobility Overal bed mobility: Needs Assistance Bed Mobility: Supine to Sit     Supine to sit: Min guard;HOB elevated;Supervision     General bed mobility comments: guard/supervision for safety, pt taking increased time and use of bed rail to pivot to EOB.    Transfers Overall transfer level: Needs assistance Equipment used: None Transfers: Sit to/from Stand Sit to Stand: Min guard         General transfer comment: pt using bil UE's for power up from low EOB, guarding for safety.  Ambulation/Gait Ambulation/Gait assistance: Min assist Gait Distance (Feet): 80 Feet Assistive device:  None;1 person hand held assist;Rolling walker (2 wheeled) Gait Pattern/deviations: Step-through pattern;Decreased stride length;Shuffle;Staggering left;Staggering right;Drifts right/left;Trunk flexed Gait velocity: decr   General Gait Details: pt began with no UE support and experienced multiple episodes of LOB requiring min assist to steady/correct. Balance improved with 1HHA with pt heavily reliant/leaning on therapist for external support. RW provided and improved stability, cues needed for posture to improve scanning environment. pt required intermittent assist to direct walker and prevent drifting. pt c/o weak sensation, fatigue, and SOB that resolved after sitting, BP 103/62 with HR 76 bpm.  Stairs            Wheelchair Mobility    Modified Rankin (Stroke Patients Only)       Balance                                             Pertinent Vitals/Pain Pain Assessment: No/denies pain    Home Living Family/patient expects to be discharged to:: Private residence Living Arrangements: Children (grandchild) Available Help at Discharge: Family Type of Home: House Home Access: Stairs to enter Entrance Stairs-Rails: None Technical brewer of Steps: 1+1 Home Layout: One level Home Equipment: Civil engineer, contracting Additional Comments: pt's grandaughter and his son and son's fiance will help him while he recovers    Prior Function Level of Independence: Independent               Hand Dominance   Dominant Hand: Right    Extremity/Trunk Assessment   Upper Extremity Assessment Upper Extremity  Assessment: Overall WFL for tasks assessed    Lower Extremity Assessment Lower Extremity Assessment: Generalized weakness    Cervical / Trunk Assessment Cervical / Trunk Assessment: Kyphotic  Communication   Communication: No difficulties  Cognition Arousal/Alertness: Awake/alert Behavior During Therapy: WFL for tasks assessed/performed Overall Cognitive  Status: Within Functional Limits for tasks assessed                                 General Comments: pt very pleasant and eager to mobilize      General Comments      Exercises     Assessment/Plan    PT Assessment Patient needs continued PT services  PT Problem List Decreased strength;Decreased balance;Decreased activity tolerance;Decreased mobility;Decreased knowledge of use of DME       PT Treatment Interventions DME instruction;Gait training;Stair training;Functional mobility training;Therapeutic activities;Therapeutic exercise;Balance training;Patient/family education    PT Goals (Current goals can be found in the Care Plan section)  Acute Rehab PT Goals Patient Stated Goal: regain strength and improve balance PT Goal Formulation: With patient Time For Goal Achievement: 08/24/20 Potential to Achieve Goals: Good    Frequency Min 3X/week   Barriers to discharge        Co-evaluation               AM-PAC PT "6 Clicks" Mobility  Outcome Measure Help needed turning from your back to your side while in Keith flat bed without using bedrails?: None Help needed moving from lying on your back to sitting on the side of Keith flat bed without using bedrails?: Keith [name] Help needed moving to and from Keith bed to Keith chair (including Keith wheelchair)?: Keith [name] Help needed standing up from Keith chair using your arms (e.g., wheelchair or bedside chair)?: Keith [name] Help needed to walk in hospital room?: Keith [name] Help needed climbing 3-5 steps with Keith railing? : Keith [name] 6 Click Score: 18    End of Session Equipment Utilized During Treatment: Gait belt Activity Tolerance: Patient tolerated treatment well;Patient limited by fatigue Patient left: in chair;with call bell/phone within reach;with chair alarm set Nurse Communication: Mobility status PT Visit Diagnosis: Muscle weakness (generalized) (M62.81);Unsteadiness on feet (R26.81)    Time: 9169-4503 PT Time Calculation (min)  (ACUTE ONLY): 20 min   Charges:   PT Evaluation $PT Eval Low Complexity: 1 Low          Verner Mould, DPT Acute Rehabilitation Services Office [phone] Pager [phone]   Jacques Navy 08/10/2020, 11:25 AM

## 2020-08-10 NOTE — Progress Notes (Signed)
Initial Nutrition Assessment  DOCUMENTATION CODES:  Severe malnutrition in context of chronic illness, Underweight  INTERVENTION:  Discontinue Ensure Surgery BID.  Add Ensure Enlive po TID, each supplement provides 350 kcal and 20 grams of protein.  Add Magic cup TID with meals, each supplement provides 290 kcal and 9 grams of protein.  Add MVI with minerals daily.  NUTRITION DIAGNOSIS:  Severe Malnutrition related to chronic illness, cancer and cancer related treatments, catabolic illness as evidenced by energy intake < or equal to 50% for > or equal to 1 month, percent weight loss, severe fat depletion, severe muscle depletion.  GOAL:  Patient will meet greater than or equal to 90% of their needs  MONITOR:  PO intake, Supplement acceptance, Diet advancement, Labs, Weight trends, Skin, I & O's  REASON FOR ASSESSMENT:  Other (Comment) (Low BMI)    ASSESSMENT:  80 y.o. male s/p Rt colectomy on 08/06/20 secondary to transvere colon cancer. PMH significant for GERD, anemia, Lt MCA stroke in 2017, CKD, hypothyroidism, melanoma, Afib with loop recorder insertion.  Pt with limited meal documentation, but 25% intake for the two meals that are documented. Spoke with pt at bedside. Pt reports not liking Ensure Surgery - and would like regular Ensure instead. He also reports that he has been eating poorly due to poor appetite for quite sometime now.  He denies any weight changes. Per Epic, pt has lost ~8 lbs (7.3%) in the last 4 months, which is significant and severe for the time frame.  On exam, pt is severely malnourished in the chronic setting.  Recommend adding Ensure Enlive TID and Magic Cup TID, as well as MVI with minerals.  Medications: reviewed; Ensure Surgery BID, Imodium TID  Labs: reviewed  NUTRITION - FOCUSED PHYSICAL EXAM: Flowsheet Row Most Recent Value  Orbital Region Severe depletion  Upper Arm Region Severe depletion  Thoracic and Lumbar Region Severe depletion   Buccal Region Severe depletion  Temple Region Severe depletion  Clavicle Bone Region Severe depletion  Clavicle and Acromion Bone Region Severe depletion  Scapular Bone Region Severe depletion  Dorsal Hand Severe depletion  Patellar Region Severe depletion  Anterior Thigh Region Severe depletion  Posterior Calf Region Severe depletion  Edema (RD Assessment) None  Hair Reviewed  Eyes Reviewed  Mouth Reviewed  Skin Reviewed  Nails Reviewed   Diet Order:   Diet Order             DIET SOFT Room service appropriate? Yes; Fluid consistency: Thin  Diet effective now                  EDUCATION NEEDS:  Education needs have been addressed  Skin:  Skin Assessment: Skin Integrity Issues: Skin Integrity Issues:: Incisions Incisions: Abdomen, closed  Last BM:  08/09/20 - Type 7, green, small  Height:  Ht Readings from Last 1 Encounters:  08/06/20 5' 9.5" (1.765 m)   Weight:  Wt Readings from Last 1 Encounters:  08/06/20 48.1 kg   BMI:  Body mass index is 15.44 kg/m.  Estimated Nutritional Needs:  Kcal:  2200-2400 Protein:  70-85 grams Fluid:  >2 L  Derrel Nip, RD, LDN (she/her/hers) Registered Dietitian I After-Hours/Weekend Pager # in [place]

## 2020-08-10 NOTE — Progress Notes (Signed)
4 Days Post-Op robotic R colectomy  Subjective/Chief Complaint: Had some diarrhea yesterday, improved with imodium, denies abd pain, tolerating a diet   Objective: Vital signs in last 24 hours: Temp:  [97.4 F (36.3 C)-98 F (36.7 C)] 98 F (36.7 C) (07/26 0517) Pulse Rate:  [54-78] 63 (07/26 0517) Resp:  [16-18] 18 (07/26 0517) BP: (95-151)/(48-77) 99/52 (07/26 0517) SpO2:  [96 %-97 %] 96 % (07/26 0517) Last BM Date: 08/09/20  Intake/Output from previous day: 07/25 0701 - 07/26 0700 In: 1663.8 [P.O.:520; I.V.:1143.8] Out: 425 [Urine:425] Intake/Output this shift: No intake/output data recorded.  General appearance: alert and cooperative Resp: clear to auscultation bilaterally Cardio: regular rate and rhythm GI: soft, mild tenderness. Incision looks good.   Lab Results:  Recent Labs    08/08/20 0439 08/09/20 0354  WBC 8.1 7.9  HGB 8.3* 10.0*  HCT 27.2* 33.0*  PLT 152 220    BMET Recent Labs    08/08/20 0439 08/09/20 0354  NA 138 137  K 4.4 4.6  CL 109 108  CO2 25 23  GLUCOSE 93 95  BUN 19 21  CREATININE 2.29* 2.48*  CALCIUM 8.4* 8.9    PT/INR No results for input(s): LABPROT, INR in the last 72 hours. ABG No results for input(s): PHART, HCO3 in the last 72 hours.  Invalid input(s): PCO2, PO2  Studies/Results: No results found.  Anti-infectives: Anti-infectives (From admission, onward)    Start     Dose/Rate Route Frequency Ordered Stop   08/06/20 0930  cefoTEtan (CEFOTAN) 2 g in sodium chloride 0.9 % 100 mL IVPB        2 g 200 mL/hr over 30 Minutes Intravenous On call to O.R. 08/06/20 0915 08/06/20 1130       Assessment/Plan: s/p Procedure(s): XI ROBOT ASSISTED RIGHT COLECTOMY (N/A) Cont soft foods Ambulate Urinary retention: flomax, d/c foley today PT eval Plan for d/c tomorrow   LOS: 4 days    Rosario Adie [id number]

## 2020-08-11 ENCOUNTER — Other Ambulatory Visit (HOSPITAL_COMMUNITY): Payer: Self-pay

## 2020-08-11 DIAGNOSIS — E43 Unspecified severe protein-calorie malnutrition: Secondary | ICD-10-CM | POA: Insufficient documentation

## 2020-08-11 MED ORDER — LOPERAMIDE HCL 2 MG PO CAPS
2.0000 mg | ORAL_CAPSULE | Freq: Two times a day (BID) | ORAL | 2 refills | Status: DC | PRN
  Filled 2020-08-11: qty 30, 15d supply, fill #0

## 2020-08-11 MED ORDER — ACETAMINOPHEN 500 MG PO TABS
1000.0000 mg | ORAL_TABLET | Freq: Four times a day (QID) | ORAL | 0 refills | Status: DC
  Filled 2020-08-11: qty 30, 4d supply, fill #0

## 2020-08-11 MED ORDER — TAMSULOSIN HCL 0.4 MG PO CAPS
0.4000 mg | ORAL_CAPSULE | Freq: Every day | ORAL | 0 refills | Status: DC
  Filled 2020-08-11: qty 30, 30d supply, fill #0

## 2020-08-11 NOTE — Discharge Instructions (Signed)
SURGERY: POST OP INSTRUCTIONS (Surgery for small bowel obstruction, colon resection, etc)   ######################################################################  EAT Gradually transition to a high fiber diet with a fiber supplement over the next few days after discharge  WALK Walk an hour a day.  Control your pain to do that.    CONTROL PAIN Control pain so that you can walk, sleep, tolerate sneezing/coughing, go up/down stairs.  HAVE A BOWEL MOVEMENT DAILY Keep your bowels regular to avoid problems.  OK to try a laxative to override constipation.  OK to use an antidairrheal to slow down diarrhea.  Call if not better after 2 tries  CALL IF YOU HAVE PROBLEMS/CONCERNS Call if you are still struggling despite following these instructions. Call if you have concerns not answered by these instructions  ######################################################################   DIET Follow a light diet the first few days at home.  Start with a bland diet such as soups, liquids, starchy foods, low fat foods, etc.  If you feel full, bloated, or constipated, stay on a ful liquid or pureed/blenderized diet for a few days until you feel better and no longer constipated. Be sure to drink plenty of fluids every day to avoid getting dehydrated (feeling dizzy, not urinating, etc.). Gradually add a fiber supplement to your diet over the next week.  Gradually get back to a regular solid diet.  Avoid fast food or heavy meals the first week as you are more likely to get nauseated. It is expected for your digestive tract to need a few months to get back to normal.  It is common for your bowel movements and stools to be irregular.  You will have occasional bloating and cramping that should eventually fade away.  Until you are eating solid food normally, off all pain medications, and back to regular activities; your bowels will not be normal. Focus on eating a low-fat, high fiber diet the rest of your life  (See Getting to Good Bowel Health, below).  CARE of your INCISION or WOUND  It is good for closed incisions and even open wounds to be washed every day.  Shower every day.  Short baths are fine.  Wash the incisions and wounds clean with soap & water.    You may leave closed incisions open to air if it is dry.   You may cover the incision with clean gauze & replace it after your daily shower for comfort.  STAPLES: You have skin staples.  Leave them in place & set up an appointment for them to be removed by a surgery office nurse ~10 days after surgery. = 1st week of January 2024    ACTIVITIES as tolerated Start light daily activities --- self-care, walking, climbing stairs-- beginning the day after surgery.  Gradually increase activities as tolerated.  Control your pain to be active.  Stop when you are tired.  Ideally, walk several times a day, eventually an hour a day.   Most people are back to most day-to-day activities in a few weeks.  It takes 4-8 weeks to get back to unrestricted, intense activity. If you can walk 30 minutes without difficulty, it is safe to try more intense activity such as jogging, treadmill, bicycling, low-impact aerobics, swimming, etc. Save the most intensive and strenuous activity for last (Usually 4-8 weeks after surgery) such as sit-ups, heavy lifting, contact sports, etc.  Refrain from any intense heavy lifting or straining until you are off narcotics for pain control.  You will have off days, but things should improve   week-by-week. DO NOT PUSH THROUGH PAIN.  Let pain be your guide: If it hurts to do something, don't do it.  Pain is your body warning you to avoid that activity for another week until the pain goes down. You may drive when you are no longer taking narcotic prescription pain medication, you can comfortably wear a seatbelt, and you can safely make sudden turns/stops to protect yourself without hesitating due to pain. You may have sexual intercourse when it  is comfortable. If it hurts to do something, stop.  MEDICATIONS Take your usually prescribed home medications unless otherwise directed.   Blood thinners:  Usually you can restart any strong blood thinners after the second postoperative day.  It is OK to take aspirin right away.     If you are on strong blood thinners (warfarin/Coumadin, Plavix, Xerelto, Eliquis, Pradaxa, etc), discuss with your surgeon, medicine PCP, and/or cardiologist for instructions on when to restart the blood thinner & if blood monitoring is needed (PT/INR blood check, etc).     PAIN CONTROL Pain after surgery or related to activity is often due to strain/injury to muscle, tendon, nerves and/or incisions.  This pain is usually short-term and will improve in a few months.  To help speed the process of healing and to get back to regular activity more quickly, DO THE FOLLOWING THINGS TOGETHER: Increase activity gradually.  DO NOT PUSH THROUGH PAIN Use Ice and/or Heat Try Gentle Massage and/or Stretching Take over the counter pain medication Take Narcotic prescription pain medication for more severe pain  Good pain control = faster recovery.  It is better to take more medicine to be more active than to stay in bed all day to avoid medications.  Increase activity gradually Avoid heavy lifting at first, then increase to lifting as tolerated over the next 6 weeks. Do not "push through" the pain.  Listen to your body and avoid positions and maneuvers than reproduce the pain.  Wait a few days before trying something more intense Walking an hour a day is encouraged to help your body recover faster and more safely.  Start slowly and stop when getting sore.  If you can walk 30 minutes without stopping or pain, you can try more intense activity (running, jogging, aerobics, cycling, swimming, treadmill, sex, sports, weightlifting, etc.) Remember: If it hurts to do it, then don't do it! Use Ice and/or Heat You will have swelling and  bruising around the incisions.  This will take several weeks to resolve. Ice packs or heating pads (6-8 times a day, 30-60 minutes at a time) will help sooth soreness & bruising. Some people prefer to use ice alone, heat alone, or alternate between ice & heat.  Experiment and see what works best for you.  Consider trying ice for the first few days to help decrease swelling and bruising; then, switch to heat to help relax sore spots and speed recovery. Shower every day.  Short baths are fine.  It feels good!  Keep the incisions and wounds clean with soap & water.   Try Gentle Massage and/or Stretching Massage at the area of pain many times a day Stop if you feel pain - do not overdo it Take over the counter pain medication This helps the muscle and nerve tissues become less irritable and calm down faster Choose ONE of the following over-the-counter anti-inflammatory medications: Acetaminophen 500mg tabs (Tylenol) 1-2 pills with every meal and just before bedtime (avoid if you have liver problems or if you have   acetaminophen in you narcotic prescription) Naproxen 220mg tabs (ex. Aleve, Naprosyn) 1-2 pills twice a day (avoid if you have kidney, stomach, IBD, or bleeding problems) Ibuprofen 200mg tabs (ex. Advil, Motrin) 3-4 pills with every meal and just before bedtime (avoid if you have kidney, stomach, IBD, or bleeding problems) Take with food/snack several times a day as directed for at least 2 weeks to help keep pain / soreness down & more manageable. Take Narcotic prescription pain medication for more severe pain A prescription for strong pain control is often given to you upon discharge (for example: oxycodone/Percocet, hydrocodone/Norco/Vicodin, or tramadol/Ultram) Take your pain medication as prescribed. Be mindful that most narcotic prescriptions contain Tylenol (acetaminophen) as well - avoid taking too much Tylenol. If you are having problems/concerns with the prescription medicine (does  not control pain, nausea, vomiting, rash, itching, etc.), please call us [phone] to see if we need to switch you to a different pain medicine that will work better for you and/or control your side effects better. If you need a refill on your pain medication, you must call the office before 4 pm and on weekdays only.  By federal law, prescriptions for narcotics cannot be called into a pharmacy.  They must be filled out on paper & picked up from our office by the patient or authorized caretaker.  Prescriptions cannot be filled after 4 pm nor on weekends.    WHEN TO CALL US [phone] Severe uncontrolled or worsening pain  Fever over 101 F (38.5 C) Concerns with the incision: Worsening pain, redness, rash/hives, swelling, bleeding, or drainage Reactions / problems with new medications (itching, rash, hives, nausea, etc.) Nausea and/or vomiting Difficulty urinating Difficulty breathing Worsening fatigue, dizziness, lightheadedness, blurred vision Other concerns If you are not getting better after two weeks or are noticing you are getting worse, contact our office [phone] for further advice.  We may need to adjust your medications, re-evaluate you in the office, send you to the emergency room, or see what other things we can do to help. The clinic staff is available to answer your questions during regular business hours (8:30am-5pm).  Please don't hesitate to call and ask to speak to one of our nurses for clinical concerns.    A surgeon from [facility] is always on call at the hospitals 24 hours/day If you have a medical emergency, go to the nearest emergency room or call 911.  FOLLOW UP in our office One the day of your discharge from the hospital (or the next business weekday), please call [facility] to set up or confirm an appointment to see your surgeon in the office for a follow-up appointment.  Usually it is 2-3 weeks after your surgery.   If you  have skin staples at your incision(s), let the office know so we can set up a time in the office for the nurse to remove them (usually around 10 days after surgery). Make sure that you call for appointments the day of discharge (or the next business weekday) from the hospital to ensure a convenient appointment time. IF YOU HAVE DISABILITY OR FAMILY LEAVE FORMS, BRING THEM TO THE OFFICE FOR PROCESSING.  DO NOT GIVE THEM TO YOUR DOCTOR.  [facility] [address], [address], [place], [place]  [address] ? [phone] - Main [phone] - Toll Free,  [phone] - Fax [email or web address]    GETTING TO GOOD BOWEL HEALTH. It is expected for your digestive tract to   need a few months to get back to normal.  It is common for your bowel movements and stools to be irregular.  You will have occasional bloating and cramping that should eventually fade away.  Until you are eating solid food normally, off all pain medications, and back to regular activities; your bowels will not be normal.   Avoiding constipation The goal: ONE SOFT BOWEL MOVEMENT A DAY!    Drink plenty of fluids.  Choose water first. TAKE A FIBER SUPPLEMENT EVERY DAY THE REST OF YOUR LIFE During your first week back home, gradually add back a fiber supplement every day Experiment which form you can tolerate.   There are many forms such as powders, tablets, wafers, gummies, etc Psyllium bran (Metamucil), methylcellulose (Citrucel), Miralax or Glycolax, Benefiber, Flax Seed.  Adjust the dose week-by-week (1/2 dose/day to 6 doses a day) until you are moving your bowels 1-2 times a day.  Cut back the dose or try a different fiber product if it is giving you problems such as diarrhea or bloating. Sometimes a laxative is needed to help jump-start bowels if constipated until the fiber supplement can help regulate your bowels.  If you are tolerating eating & you are farting, it is okay to try a gentle  laxative such as double dose MiraLax, prune juice, or Milk of Magnesia.  Avoid using laxatives too often. Stool softeners can sometimes help counteract the constipating effects of narcotic pain medicines.  It can also cause diarrhea, so avoid using for too long. If you are still constipated despite taking fiber daily, eating solids, and a few doses of laxatives, call our office. Controlling diarrhea Try drinking liquids and eating bland foods for a few days to avoid stressing your intestines further. Avoid dairy products (especially milk & ice cream) for a short time.  The intestines often can lose the ability to digest lactose when stressed. Avoid foods that cause gassiness or bloating.  Typical foods include beans and other legumes, cabbage, broccoli, and dairy foods.  Avoid greasy, spicy, fast foods.  Every person has some sensitivity to other foods, so listen to your body and avoid those foods that trigger problems for you. Probiotics (such as active yogurt, Align, etc) may help repopulate the intestines and colon with normal bacteria and calm down a sensitive digestive tract Adding a fiber supplement gradually can help thicken stools by absorbing excess fluid and retrain the intestines to act more normally.  Slowly increase the dose over a few weeks.  Too much fiber too soon can backfire and cause cramping & bloating. It is okay to try and slow down diarrhea with a few doses of antidiarrheal medicines.   Bismuth subsalicylate (ex. Kayopectate, Pepto Bismol) for a few doses can help control diarrhea.  Avoid if pregnant.   Loperamide (Imodium) can slow down diarrhea.  Start with one tablet (2mg) first.  Avoid if you are having fevers or severe pain.  ILEOSTOMY PATIENTS WILL HAVE CHRONIC DIARRHEA since their colon is not in use.    Drink plenty of liquids.  You will need to drink even more glasses of water/liquid a day to avoid getting dehydrated. Record output from your ileostomy.  Expect to empty  the bag every 3-4 hours at first.  Most people with a permanent ileostomy empty their bag 4-6 times at the least.   Use antidiarrheal medicine (especially Imodium) several times a day to avoid getting dehydrated.  Start with a dose at bedtime & breakfast.  Adjust up or   down as needed.  Increase antidiarrheal medications as directed to avoid emptying the bag more than 8 times a day (every 3 hours). Work with your wound ostomy nurse to learn care for your ostomy.  See ostomy care instructions. TROUBLESHOOTING IRREGULAR BOWELS 1) Start with a soft & bland diet. No spicy, greasy, or fried foods.  2) Avoid gluten/wheat or dairy products from diet to see if symptoms improve. 3) Miralax 17gm or flax seed mixed in 8oz. water or juice-daily. May use 2-4 times a day as needed. 4) Gas-X, Phazyme, etc. as needed for gas & bloating.  5) Prilosec (omeprazole) over-the-counter as needed 6)  Consider probiotics (Align, Activa, etc) to help calm the bowels down  Call your doctor if you are getting worse or not getting better.  Sometimes further testing (cultures, endoscopy, X-ray studies, CT scans, bloodwork, etc.) may be needed to help diagnose and treat the cause of the diarrhea. [facility] [address], [address], [place], [place]  [address] [phone] - Main.    [phone]  - Toll Free.   [phone] - Fax [email or web address]   ###############################   #######################################################  Ostomy Support Information  You've heard that people get along just fine with only one of their eyes, or one of their lungs, or one of their kidneys. But you also know that you have only one intestine and only one bladder, and that leaves you feeling awfully empty, both physically and emotionally: You think no other people go around without part of their intestine with the ends of their intestines sticking out through their abdominal walls.    YOU ARE NOT ALONE.  There are nearly three quarters of a million people in the [place] who have an ostomy; people who have had surgery to remove all or part of their colons or bladders.   There is even a national association, the [employer] with over 350 local affiliated support groups that are organized by volunteers who provide peer support and counseling. [employer] has a toll free telephone num-ber, 800-826-0826 and an educational, interactive website, www.ostomy.org   An ostomy is an opening in the belly (abdominal wall) made by surgery. Ostomates are people who have had this procedure. The opening (stoma) allows the kidney or bowel to grdischarge waste. An external pouch covers the stoma to collect waste. Pouches are are a simple bag and are odor free. Different companies have disposable or reusable pouches to fit one's lifestyle. An ostomy can either be temporary or permanent.   THERE ARE THREE MAIN TYPES OF OSTOMIES Colostomy. A colostomy is a surgically created opening in the large intestine (colon). Ileostomy. An ileostomy is a surgically created opening in the small intestine. Urostomy. A urostomy is a surgically created opening to divert urine away from the bladder.  OSTOMY Care  The following guidelines will make care of your colostomy easier. Keep this information close by for quick reference.  Helpful DIET hints Eat a well-balanced diet including vegetables and fresh fruits. Eat on a regular schedule.  Drink at least 6 to 8 glasses of fluids daily. Eat slowly in a relaxed atmosphere. Chew your food thoroughly. Avoid chewing gum, smoking, and drinking from a straw. This will help decrease the amount of air you swallow, which may help reduce gas. Eating yogurt or drinking buttermilk may help reduce gas.  To control gas at night, do not eat after 8 p.m. This will give your bowel time to quiet down before you go   to bed.  If gas is a problem, you can purchase  Beano. Sprinkle Beano on the first bite of food before eating to reduce gas. It has no flavor and should not change the taste of your food. You can buy Beano over the counter at your local drugstore.  Foods like fish, onions, garlic, broccoli, asparagus, and cabbage produce odor. Although your pouch is odor-proof, if you eat these foods you may notice a stronger odor when emptying your pouch. If this is a concern, you may want to limit these foods in your diet.  If you have an ileostomy, you will have chronic diarrhea & need to drink more liquids to avoid getting dehydrated.  Consider antidiarrheal medicine like imodium (loperamide) or Lomotil to help slow down bowel movements / diarrhea into your ileostomy bag.  GETTING TO GOOD BOWEL HEALTH WITH AN ILEOSTOMY    With the colon bypassed & not in use, you will have small bowel diarrhea.   It is important to thicken & slow your bowel movements down.   The goal: 4-6 small BOWEL MOVEMENTS A DAY It is important to drink plenty of liquids to avoid getting dehydrated  CONTROLLING ILEOSTOMY DIARRHEA  TAKE A FIBER SUPPLEMENT (FiberCon or Benefiner soluble fiber) twice a day - to thicken stools by absorbing excess fluid and retrain the intestines to act more normally.  Slowly increase the dose over a few weeks.  Too much fiber too soon can backfire and cause cramping & bloating.  TAKE AN IRON SUPPLEMENT twice a day to naturally constipate your bowels.  Usually ferrous sulfate 325mg twice a day)  TAKE ANTI-DIARRHEAL MEDICINES: Loperamide (Imodium) can slow down diarrhea.  Start with two tablets (= 4mg) first and then try one tablet every 6 hours.  Can go up to 2 pills four times day (8 pills of 2mg max) Avoid if you are having fevers or severe pain.  If you are not better or start feeling worse, stop all medicines and call your doctor for advice LoMotil (Diphenoxylate / Atropine) is another medicine that can constipate & slow down bowel moevements Pepto  Bismol (bismuth) can gently thicken bowels as well  If diarrhea is worse,: drink plenty of liquids and try simpler foods for a few days to avoid stressing your intestines further. Avoid dairy products (especially milk & ice cream) for a short time.  The intestines often can lose the ability to digest lactose when stressed. Avoid foods that cause gassiness or bloating.  Typical foods include beans and other legumes, cabbage, broccoli, and dairy foods.  Every person has some sensitivity to other foods, so listen to our body and avoid those foods that trigger problems for you.Call your doctor if you are getting worse or not better.  Sometimes further testing (cultures, endoscopy, X-ray studies, bloodwork, etc) may be needed to help diagnose and treat the cause of the diarrhea. Take extra anti-diarrheal medicines (maximum is 8 pills of 2mg loperamide a day)   Tips for POUCHING an OSTOMY   Changing Your Pouch The best time to change your pouch is in the morning, before eating or drinking anything. Your stoma can function at any time, but it will function more after eating or drinking.   Applying the pouching system  Place all your equipment close at hand before removing your pouch.  Wash your hands.  Stand or sit in front of a mirror. Use the position that works best for you. Remember that you must keep the skin around the stoma   wrinkle-free for a good seal.  Gently remove the used pouch (1-piece system) or the pouch and old wafer (2-piece system). Empty the pouch into the toilet. Save the closure clip to use again.  Wash the stoma itself and the skin around the stoma. Your stoma may bleed a Dome when being washed. This is normal. Rinse and pat dry. You may use a wash cloth or soft paper towels (like Bounty), mild soap (like Dial, Safeguard, or Ivory), and water. Avoid soaps that contain perfumes or lotions.  For a new pouch (1-piece system) or a new wafer (2-piece system), measure your  stoma using the stoma guide in each box of supplies.  Trace the shape of your stoma onto the back of the new pouch or the back of the new wafer. Cut out the opening. Remove the paper backing and set it aside.  Optional: Apply a skin barrier powder to surrounding skin if it is irritated (bare or weeping), and dust off the excess. Optional: Apply a skin-prep wipe (such as Skin Prep or All-Kare) to the skin around the stoma, and let it dry. Do not apply this solution if the skin is irritated (red, tender, or broken) or if you have shaved around the stoma. Optional: Apply a skin barrier paste (such as Stomahesive, Coloplast, or Premium) around the opening cut in the back of the pouch or wafer. Allow it to dry for 30 to 60 seconds.  Hold the pouch (1-piece system) or wafer (2-piece system) with the sticky side toward your body. Make sure the skin around the stoma is wrinkle-free. Center the opening on the stoma, then press firmly to your abdomen (Fig. 4). Look in the mirror to check if you are placing the pouch, or wafer, in the right position. For a 2-piece system, snap the pouch onto the wafer. Make sure it snaps into place securely.  Place your hand over the stoma and the pouch or wafer for about 30 seconds. The heat from your hand can help the pouch or wafer stick to your skin.  Add deodorant (such as Super Banish or Nullo) to your pouch. Other options include food extracts such as vanilla oil and peppermint extract. Add about 10 drops of the deodorant to the pouch. Then apply the closure clamp. Note: Do not use toxic  chemicals or commercial cleaning agents in your pouch. These substances may harm the stoma.  Optional: For extra seal, apply tape to all 4 sides around the pouch or wafer, as if you were framing a picture. You may use any brand of medical adhesive tape. Change your pouch every 5 to 7 days. Change it immediately if a leak occurs.  Wash your hands afterwards.  If you are wearing a  2-piece system, you may use 2 new pouches per week and alternate them. Rinse the pouch with mild soap and warm water and hang it to dry for the next day. Apply the fresh pouch. Alternate the 2 pouches like this for a week. After a week, change the wafer and begin with 2 new pouches. Place the old pouches in a plastic bag, and put them in the trash.   LIVING WITH AN OSTOMY  Emptying Your Pouch Empty your pouch when it is one-third full (of urine, stool, and/or gas). If you wait until your pouch is fuller than this, it will be more difficult to empty and more noticeable. When you empty your pouch, either put toilet paper in the toilet bowl first, or flush the   toilet while you empty the pouch. This will reduce splashing. You can empty the pouch between your legs or to one side while sitting, or while standing or stooping. If you have a 2-piece system, you can snap off the pouch to empty it. Remember that your stoma may function during this time. If you wish to rinse your pouch after you empty it, a turkey baster can be helpful. When using a baster, squirt water up into the pouch through the opening at the bottom. With a 2-piece system, you can snap off the pouch to rinse it. After rinsing  your pouch, empty it into the toilet. When rinsing your pouch at home, put a few granules of Dreft soap in the rinse water. This helps lubricate and freshen your pouch. The inside of your pouch can be sprayed with non-stick cooking oil (Pam spray). This may help reduce stool sticking to the inside of the pouch.  Bathing You may shower or bathe with your pouch on or off. Remember that your stoma may function during this time.  The materials you use to wash your stoma and the skin around it should be clean, but they do not need to be sterile.  Wearing Your Pouch During hot weather, or if you perspire a lot in general, wear a cover over your pouch. This may prevent a rash on your skin under the pouch. Pouch covers are  sold at ostomy supply stores. Wear the pouch inside your underwear for better support. Watch your weight. Any gain or loss of 10 to 15 pounds or more can change the way your pouch fits.  Going Away From Home A collapsible cup (like those that come in travel kits) or a soft plastic squirt bottle with a pull-up top (like a travel bottle for shampoo) can be used for rinsing your pouch when you are away from home. Tilt the opening of the pouch at an upward angle when using a cup to rinse.  Carry wet wipes or extra tissues to use in public bathrooms.  Carry an extra pouching system with you at all times.  Never keep ostomy supplies in the glove compartment of your car. Extreme heat or cold can damage the skin barriers and adhesive wafers on the pouch.  When you travel, carry your ostomy supplies with you at all times. Keep them within easy reach. Do not pack ostomy supplies in baggage that will be checked or otherwise separated from you, because your baggage might be lost. If you're traveling out of the country, it is helpful to have a letter stating that you are carrying ostomy supplies as a medical necessity.  If you need ostomy supplies while traveling, look in the yellow pages of the telephone book under "Surgical Supplies." Or call the local ostomy organization to find out where supplies are available.  Do not let your ostomy supplies get low. Always order new pouches before you use the last one.  Reducing Odor Limit foods such as broccoli, cabbage, onions, fish, and garlic in your diet to help reduce odor. Each time you empty your pouch, carefully clean the opening of the pouch, both inside and outside, with toilet paper. Rinse your pouch 1 or 2 times daily after you empty it (see directions for emptying your pouch and going away from home). Add deodorant (such as Super Banish or Nullo) to your pouch. Use air deodorizers in your bathroom. Do not add aspirin to your pouch. Even though  aspirin can help prevent odor, it   could cause ulcers on your stoma.  When to call the doctor Call the doctor if you have any of the following symptoms: Purple, black, or white stoma Severe cramps lasting more than 6 hours Severe watery discharge from the stoma lasting more than 6 hours No output from the colostomy for 3 days Excessive bleeding from your stoma Swelling of your stoma to more than 1/2-inch larger than usual Pulling inward of your stoma below skin level Severe skin irritation or deep ulcers Bulging or other changes in your abdomen  When to call your ostomy nurse Call your ostomy/enterostomal therapy (WOCN) nurse if any of the following occurs: Frequent leaking of your pouching system Change in size or appearance of your stoma, causing discomfort or problems with your pouch Skin rash or rawness Weight gain or loss that causes problems with your pouch     FREQUENTLY ASKED QUESTIONS   Why haven't you met any of these folks who have an ostomy?  Well, maybe you have! You just did not recognize them because an ostomy doesn't show. It can be kept secret if you wish. Why, maybe some of your best friends, office associates or neighbors have an ostomy ... you never can tell. People facing ostomy surgery have many quality-of-life questions like: Will you bulge? Smell? Make noises? Will you feel waste leaving your body? Will you be a captive of the toilet? Will you starve? Be a social outcast? Get/stay married? Have babies? Easily bathe, go swimming, bend over?  OK, let's look at what you can expect:   Will you bulge?  Remember, without part of the intestine or bladder, and its contents, you should have a flatter tummy than before. You can expect to wear, with [place] exception, what you wore before surgery ... and this in-cludes tight clothing and bathing suits.   Will you smell?  Today, thanks to modern odor proof pouching systems, you can walk into an ostomy support group  meeting and not smell anything that is foul or offensive. And, for those with an ileostomy or colostomy who are concerned about odor when emptying their pouch, there are in-pouch deodorants that can be used to eliminate any waste odors that may exist.   Will you make noises?  Everyone produces gas, especially if they are an air-swallower. But intestinal sounds that occur from time to time are no differ-ent than a gurgling tummy, and quite often your clothing will muffle any sounds.   Will you feel the waste discharges?  For those with a colostomy or ileostomy there might be a slight pressure when waste leaves your body, but understand that the intestines have no nerve endings, so there will be no unpleasant sensations. Those with a urostomy will probably be unaware of any kidney drainage.   Will you be a captive of the toilet?  Immediately post-op you will spend more time in the bathroom than you will after your body recovers from surgery. Every person is different, but on average those with an ileostomy or urostomy may empty their pouches 4 to 6 times a day; a [place]  less if you have a colostomy. The average wear time between pouch system changes is 3 to 5 days and the changing process should take less than 30 minutes.   Will I need to be on a special diet? Most people return to their normal diet when they have recovered from surgery. Be sure to chew your food well, eat a well-balanced diet and drink plenty of fluids. If   you experience problems with a certain food, wait a couple of weeks and try it again.  Will there be odor and noises? Pouching systems are designed to be odor-proof or odor-resistant. There are deodorants that can be used in the pouch. Medications are also available to help reduce odor. Limit gas-producing foods and carbonated beverages. You will experience less gas and fewer noises as you heal from surgery.  How much time will it take to care for my ostomy? At first, you may  spend a lot of time learning about your ostomy and how to take care of it. As you become more comfortable and skilled at changing the pouching system, it will take very Heather time to care for it.   Will I be able to return to work? People with ostomies can perform most jobs. As soon as you have healed from surgery, you should be able to return to work. Heavy lifting (more than 10 pounds) may be discouraged.   What about intimacy? Sexual relationships and intimacy are important and fulfilling aspects of your life. They should continue after ostomy surgery. Intimacy-related concerns should be discussed openly between you and your partner.   Can I wear regular clothing? You do not need to wear special clothing. Ostomy pouches are fairly flat and barely noticeable. Elastic undergarments will not hurt the stoma or prevent the ostomy from functioning.   Can I participate in sports? An ostomy should not limit your involvement in sports. Many people with ostomies are runners, skiers, swimmers or participate in other active lifestyles. Talk with your caregiver first before doing heavy physical activity.  Will you starve?  Not if you follow doctor's orders at each stage of your post-op adjustment. There is no such thing as an "ostomy diet". Some people with an ostomy will be able to eat and tolerate anything; others may find diffi-culty with some foods. Each person is an individual and must determine, by trial, what is best for them. A good practice for all is to drink plenty of water.   Will you be a social outcast?  Have you met anyone who has an ostomy and is a social outcast? Why should you be the first? Only your attitude and self image will effect how you are treated. No confi-dent person is an outcast.    PROFESSIONAL HELP   Resources are available if you need help or have questions about your ostomy.   Specially trained nurses called Wound, Ostomy Continence Nurses (WOCN) are available for  consultation in most major medical centers.  Consider getting an ostomy consult at an outpatient ostomy clinic.   [facility] has an Ostomy Clinic run by an WOCN ostomy nurse at the  Hospital campus.  [phone]. [facility] can help set up an appointment   The United Ostomy Association (UOA) is a group made up of many local chapters throughout the [place]. These local groups hold meetings and provide support to prospective and existing ostomates. They sponsor educational events and have qualified visitors to make personal or telephone visits. Contact the UOA for the chapter nearest you and for other educational publications.  More detailed information can be found in Colostomy Guide, a publication of the United Ostomy Association (UOA). Contact UOA at 1-800-826-0826 or visit their web site at www.uoaa.org. The website contains links to other sites, suppliers and resources.  [employer] Secure Start Services: Start at the website to enlist for support.  Your Wound Ostomy (WOCN) nurse may have started this   process. https://www.[employer].com/en/securestart Secure Start services are designed to support people as they live their lives with an ostomy or neurogenic bladder. Enrolling is easy and at no cost to the patient. We realize that each person's needs and life journey are different. Through Secure Start services, we want to help people live their life, their way.  #######################################################  

## 2020-08-11 NOTE — Discharge Summary (Signed)
Physician Discharge Summary  Patient ID: Keith [name] MRN: [id number] DOB/AGE: Aug 18, 1940 80 y.o.  Admit date: 08/06/2020 Discharge date: 08/11/2020  Admission Diagnoses:  Discharge Diagnoses:  Active Problems:   Cancer of transverse colon ([place])   Protein-calorie malnutrition, severe   Discharged Condition: good  Hospital Course: Patient admitted to the hospital after surgery.  His diet was advanced slowly.  He developed some chest pain within the day following surgery but EKG and troponin were normal.  He slowly began to ambulate with assistance and tolerate a diet.  He did develop some diarrhea which responded well to Imodium.  Patient was unable to urinate after catheter removal on postop day 2 and Foley was reinserted and Flomax was started.  Catheter was then removed again the following day and the patient still could not void.  Catheter was replaced and switched to a leg bag.  He will continue the Flomax and follow-up with his urologist.  Consults: None  Significant Diagnostic Studies: labs: cbc, bmet  Treatments: IV hydration, therapies: PT, and surgery: robotic R colectomy  Discharge Exam: Blood pressure (!) 105/59, pulse 64, temperature 97.7 F (36.5 C), temperature source Oral, resp. rate 18, height 5' 9.5" (1.765 m), weight 50.2 kg, SpO2 95 %. General appearance: alert and cooperative GI: soft, non-tender; bowel sounds normal; no masses,  no organomegaly Incision/Wound:clean, dry, intact  Disposition: home   Allergies as of 08/11/2020       Reactions   Penicillins Other (See Comments)   UNKNOWN REACTION FROM CHILDHOOD  Has patient had a PCN reaction causing immediate rash, facial/tongue/throat swelling, SOB or lightheadedness with hypotension: Unknown Has patient had a PCN reaction causing severe rash involving mucus membranes or skin necrosis: Unknown Has patient had a PCN reaction that required hospitalization: Unknown Has patient had a PCN reaction occurring  within the last 10 years: No If all of the above answers are "NO", then may proceed with Cephalosporin use.        Medication List     TAKE these medications    acetaminophen 500 MG tablet Commonly known as: TYLENOL Take 2 tablets (1,000 mg total) by mouth every 6 (six) hours.   apixaban 2.5 MG Tabs tablet Commonly known as: ELIQUIS Take 1 tablet (2.5 mg total) by mouth 2 (two) times daily. What changed: when to take this   atorvastatin 40 MG tablet Commonly known as: LIPITOR TAKE 1 TABLET (40 MG TOTAL) BY MOUTH DAILY AT 6 PM.   cholecalciferol 25 MCG (1000 UNIT) tablet Commonly known as: VITAMIN D3 Take 1,000 Units by mouth daily.   Dapsone 5 % topical gel Apply 1 application topically 2 (two) times a week.   famotidine 20 MG tablet Commonly known as: PEPCID TAKE 1 TABLET BY MOUTH TWICE A DAY   ferrous sulfate 325 (65 FE) MG tablet Take 325 mg by mouth daily.   loperamide 2 MG capsule Commonly known as: IMODIUM Take 1 capsule (2 mg total) by mouth 2 (two) times daily as needed for diarrhea or loose stools.   multivitamin capsule Take 1 capsule by mouth daily.   tamsulosin 0.4 MG Caps capsule Commonly known as: FLOMAX Take 1 capsule (0.4 mg total) by mouth daily. Start taking on: August 12, 2020   timolol 0.5 % ophthalmic solution Commonly known as: TIMOPTIC Place 1 drop into both eyes 2 (two) times daily.               Durable Medical Equipment  (From admission, onward)  Start     Ordered   08/11/20 1230  For home use only DME 3 n 1  Once        08/11/20 1229   08/11/20 1230  For home use only DME Walker rolling  Once       Comments: As needed for balance to help transfer and ambulate  Question Answer Comment  Walker: With [place]   Patient needs a walker to treat with the following condition Dependent on walker for ambulation      08/11/20 1229            Follow-up Information     Leighton Ruff, MD. Schedule an  appointment as soon as possible for a visit in 2 week(s).   Specialties: General Surgery, Colon and Rectal Surgery Contact information: [place] [address] [phone]                 Signed: Rosario Adie 01/17/5850, 12:34 PM

## 2020-08-11 NOTE — Progress Notes (Signed)
Pt retaining urine. Bladder scan 302 mL. C/O lower abdominal pain. Georgette Dover, MD notified. Foley placed per orders. Pt voided 350 mL. Bladder scan 70 mL.

## 2020-08-11 NOTE — TOC Transition Note (Signed)
Transition of Care [facility]) - CM/SW Discharge Note   Patient Details  Name: Keith [name] MRN: [id number] Date of Birth: 1940-02-26  [facility] CM/SW Contact:  Lennart Pall, LCSW Phone Number: 08/11/2020, 1:17 PM   Clinical Narrative:    Orders received to assist with DME and HH needs.  Pt reports he is to dc home today with granddaughter who will provide any needed assistance.  He is agreeable to DME and HH recs.  DME ordered via [employer] and HHPT/RN via [facility]. (See below).  No further TOC needs.   Final next level of care: [place] Barriers to Discharge: No Barriers Identified   Patient Goals and CMS Choice Patient states their goals for this hospitalization and ongoing recovery are:: return home      Discharge Placement                       Discharge Plan and Services                DME Arranged: 3-N-1, Walker rolling DME Agency: [employer] Date DME Agency Contacted: 08/11/20 Time DME Agency Contacted: 1025 Representative spoke with at DME Agency: Freda Munro HH Arranged: RN, PT [facility] Agency: [place] Date [place]: 08/11/20 Time [place]: 1317 Representative spoke with at [place]: [place] ([place]) Interventions     Readmission Risk Interventions No flowsheet data found.

## 2020-08-12 ENCOUNTER — Other Ambulatory Visit (HOSPITAL_COMMUNITY): Payer: Self-pay

## 2020-08-13 ENCOUNTER — Inpatient Hospital Stay (HOSPITAL_COMMUNITY): Payer: Medicare Other

## 2020-08-13 ENCOUNTER — Emergency Department (HOSPITAL_COMMUNITY): Payer: Medicare Other

## 2020-08-13 ENCOUNTER — Inpatient Hospital Stay (HOSPITAL_COMMUNITY)
Admission: EM | Admit: 2020-08-13 | Discharge: 2020-08-19 | DRG: 389 | Disposition: A | Discharge status: Home / Self Care | Payer: Medicare Other | Attending: General Surgery | Admitting: General Surgery

## 2020-08-13 ENCOUNTER — Other Ambulatory Visit: Payer: Self-pay

## 2020-08-13 ENCOUNTER — Encounter (HOSPITAL_COMMUNITY): Payer: Self-pay

## 2020-08-13 DIAGNOSIS — Y838 Other surgical procedures as the cause of abnormal reaction of the patient, or of later complication, without mention of misadventure at the time of the procedure: Secondary | ICD-10-CM | POA: Diagnosis present

## 2020-08-13 DIAGNOSIS — I4510 Unspecified right bundle-branch block: Secondary | ICD-10-CM | POA: Diagnosis present

## 2020-08-13 DIAGNOSIS — E890 Postprocedural hypothyroidism: Secondary | ICD-10-CM | POA: Diagnosis not present

## 2020-08-13 DIAGNOSIS — Z801 Family history of malignant neoplasm of trachea, bronchus and lung: Secondary | ICD-10-CM | POA: Diagnosis not present

## 2020-08-13 DIAGNOSIS — K458 Other specified abdominal hernia without obstruction or gangrene: Secondary | ICD-10-CM | POA: Insufficient documentation

## 2020-08-13 DIAGNOSIS — K219 Gastro-esophageal reflux disease without esophagitis: Secondary | ICD-10-CM | POA: Diagnosis not present

## 2020-08-13 DIAGNOSIS — K913 Postprocedural intestinal obstruction, unspecified as to partial versus complete: Principal | ICD-10-CM | POA: Diagnosis present

## 2020-08-13 DIAGNOSIS — Z85038 Personal history of other malignant neoplasm of large intestine: Secondary | ICD-10-CM

## 2020-08-13 DIAGNOSIS — Z7689 Persons encountering health services in other specified circumstances: Secondary | ICD-10-CM

## 2020-08-13 DIAGNOSIS — I499 Cardiac arrhythmia, unspecified: Secondary | ICD-10-CM | POA: Diagnosis not present

## 2020-08-13 DIAGNOSIS — Z87891 Personal history of nicotine dependence: Secondary | ICD-10-CM | POA: Diagnosis not present

## 2020-08-13 DIAGNOSIS — H409 Unspecified glaucoma: Secondary | ICD-10-CM | POA: Diagnosis not present

## 2020-08-13 DIAGNOSIS — R55 Syncope and collapse: Secondary | ICD-10-CM | POA: Diagnosis present

## 2020-08-13 DIAGNOSIS — K56609 Unspecified intestinal obstruction, unspecified as to partial versus complete obstruction: Secondary | ICD-10-CM | POA: Diagnosis not present

## 2020-08-13 DIAGNOSIS — Z85820 Personal history of malignant melanoma of skin: Secondary | ICD-10-CM

## 2020-08-13 DIAGNOSIS — Z79899 Other long term (current) drug therapy: Secondary | ICD-10-CM | POA: Diagnosis not present

## 2020-08-13 DIAGNOSIS — K6389 Other specified diseases of intestine: Secondary | ICD-10-CM | POA: Diagnosis not present

## 2020-08-13 DIAGNOSIS — K5939 Other megacolon: Secondary | ICD-10-CM | POA: Diagnosis not present

## 2020-08-13 DIAGNOSIS — Z8673 Personal history of transient ischemic attack (TIA), and cerebral infarction without residual deficits: Secondary | ICD-10-CM

## 2020-08-13 DIAGNOSIS — R6889 Other general symptoms and signs: Secondary | ICD-10-CM | POA: Diagnosis not present

## 2020-08-13 DIAGNOSIS — R404 Transient alteration of awareness: Secondary | ICD-10-CM | POA: Diagnosis not present

## 2020-08-13 DIAGNOSIS — N184 Chronic kidney disease, stage 4 (severe): Secondary | ICD-10-CM | POA: Diagnosis not present

## 2020-08-13 DIAGNOSIS — Z833 Family history of diabetes mellitus: Secondary | ICD-10-CM | POA: Diagnosis not present

## 2020-08-13 DIAGNOSIS — Z4659 Encounter for fitting and adjustment of other gastrointestinal appliance and device: Secondary | ICD-10-CM

## 2020-08-13 DIAGNOSIS — I959 Hypotension, unspecified: Secondary | ICD-10-CM | POA: Diagnosis not present

## 2020-08-13 DIAGNOSIS — Z9049 Acquired absence of other specified parts of digestive tract: Secondary | ICD-10-CM | POA: Diagnosis not present

## 2020-08-13 DIAGNOSIS — Z7901 Long term (current) use of anticoagulants: Secondary | ICD-10-CM | POA: Diagnosis not present

## 2020-08-13 DIAGNOSIS — K3189 Other diseases of stomach and duodenum: Secondary | ICD-10-CM | POA: Diagnosis not present

## 2020-08-13 DIAGNOSIS — Z20822 Contact with and (suspected) exposure to covid-19: Secondary | ICD-10-CM | POA: Diagnosis present

## 2020-08-13 DIAGNOSIS — Z4682 Encounter for fitting and adjustment of non-vascular catheter: Secondary | ICD-10-CM | POA: Diagnosis not present

## 2020-08-13 DIAGNOSIS — R4020 Unspecified coma: Secondary | ICD-10-CM | POA: Diagnosis not present

## 2020-08-13 LAB — URINALYSIS, ROUTINE W REFLEX MICROSCOPIC
Bilirubin Urine: NEGATIVE
Glucose, UA: NEGATIVE mg/dL
Ketones, ur: 20 mg/dL — AB
Nitrite: NEGATIVE
Protein, ur: 30 mg/dL — AB
Specific Gravity, Urine: 1.023 (ref 1.005–1.030)
pH: 5 (ref 5.0–8.0)

## 2020-08-13 LAB — COMPREHENSIVE METABOLIC PANEL
ALT: 13 U/L (ref 0–44)
AST: 20 U/L (ref 15–41)
Albumin: 2.5 g/dL — ABNORMAL LOW (ref 3.5–5.0)
Alkaline Phosphatase: 60 U/L (ref 38–126)
Anion gap: 16 — ABNORMAL HIGH (ref 5–15)
BUN: 30 mg/dL — ABNORMAL HIGH (ref 8–23)
CO2: 20 mmol/L — ABNORMAL LOW (ref 22–32)
Calcium: 8.8 mg/dL — ABNORMAL LOW (ref 8.9–10.3)
Chloride: 100 mmol/L (ref 98–111)
Creatinine, Ser: 2.87 mg/dL — ABNORMAL HIGH (ref 0.61–1.24)
GFR, Estimated: 22 mL/min — ABNORMAL LOW (ref >60)
Glucose, Bld: 132 mg/dL — ABNORMAL HIGH (ref 70–99)
Potassium: 4.6 mmol/L (ref 3.5–5.1)
Sodium: 136 mmol/L (ref 135–145)
Total Bilirubin: 0.7 mg/dL (ref 0.3–1.2)
Total Protein: 5.8 g/dL — ABNORMAL LOW (ref 6.5–8.1)

## 2020-08-13 LAB — CBC WITH DIFFERENTIAL/PLATELET
Abs Immature Granulocytes: 0.08 10*3/uL — ABNORMAL HIGH (ref 0.00–0.07)
Basophils Absolute: 0 10*3/uL (ref 0.0–0.1)
Basophils Relative: 0 %
Eosinophils Absolute: 0.2 10*3/uL (ref 0.0–0.5)
Eosinophils Relative: 2 %
HCT: 34 % — ABNORMAL LOW (ref 39.0–52.0)
Hemoglobin: 10.1 g/dL — ABNORMAL LOW (ref 13.0–17.0)
Immature Granulocytes: 1 %
Lymphocytes Relative: 7 %
Lymphs Abs: 0.7 10*3/uL (ref 0.7–4.0)
MCH: 27.9 pg (ref 26.0–34.0)
MCHC: 29.7 g/dL — ABNORMAL LOW (ref 30.0–36.0)
MCV: 93.9 fL (ref 80.0–100.0)
Monocytes Absolute: 0.7 10*3/uL (ref 0.1–1.0)
Monocytes Relative: 8 %
Neutro Abs: 7.7 10*3/uL (ref 1.7–7.7)
Neutrophils Relative %: 82 %
Platelets: 291 10*3/uL (ref 150–400)
RBC: 3.62 MIL/uL — ABNORMAL LOW (ref 4.22–5.81)
RDW: 14 % (ref 11.5–15.5)
WBC: 9.4 10*3/uL (ref 4.0–10.5)
nRBC: 0 % (ref 0.0–0.2)

## 2020-08-13 LAB — RESP PANEL BY RT-PCR (FLU A&B, COVID) ARPGX2
Influenza A by PCR: NEGATIVE
Influenza B by PCR: NEGATIVE
SARS Coronavirus 2 by RT PCR: NEGATIVE

## 2020-08-13 LAB — LACTIC ACID, PLASMA: Lactic Acid, Venous: 1 mmol/L (ref 0.5–1.9)

## 2020-08-13 SURGERY — LAPAROSCOPY, DIAGNOSTIC
Anesthesia: General

## 2020-08-13 MED ORDER — MORPHINE SULFATE (PF) 2 MG/ML IV SOLN
2.0000 mg | INTRAVENOUS | Status: DC | PRN
  Administered 2020-08-13: 2 mg via INTRAVENOUS
  Filled 2020-08-13: qty 1

## 2020-08-13 MED ORDER — METRONIDAZOLE 500 MG/100ML IV SOLN
500.0000 mg | Freq: Three times a day (TID) | INTRAVENOUS | Status: DC
  Administered 2020-08-13: 500 mg via INTRAVENOUS
  Filled 2020-08-13: qty 100

## 2020-08-13 MED ORDER — METHOCARBAMOL 500 MG PO TABS
1000.0000 mg | ORAL_TABLET | Freq: Three times a day (TID) | ORAL | Status: DC
  Administered 2020-08-16 – 2020-08-18 (×5): 1000 mg via ORAL
  Filled 2020-08-13 (×8): qty 2

## 2020-08-13 MED ORDER — MENTHOL 3 MG MT LOZG
1.0000 | LOZENGE | OROMUCOSAL | Status: DC | PRN
  Filled 2020-08-13: qty 9

## 2020-08-13 MED ORDER — LACTATED RINGERS IV SOLN: INTRAVENOUS | Status: DC

## 2020-08-13 MED ORDER — ONDANSETRON HCL 4 MG/2ML IJ SOLN
4.0000 mg | Freq: Four times a day (QID) | INTRAMUSCULAR | Status: DC | PRN
  Administered 2020-08-13: 4 mg via INTRAVENOUS
  Filled 2020-08-13: qty 2

## 2020-08-13 MED ORDER — ENOXAPARIN SODIUM 30 MG/0.3ML IJ SOSY
30.0000 mg | PREFILLED_SYRINGE | INTRAMUSCULAR | Status: DC
  Administered 2020-08-14 – 2020-08-18 (×5): 30 mg via SUBCUTANEOUS
  Filled 2020-08-13 (×5): qty 0.3

## 2020-08-13 MED ORDER — SODIUM CHLORIDE 0.9 % IV SOLN
2.0000 g | Freq: Once | INTRAVENOUS | Status: AC
  Administered 2020-08-13: 2 g via INTRAVENOUS
  Filled 2020-08-13: qty 2

## 2020-08-13 MED ORDER — CHLORHEXIDINE GLUCONATE CLOTH 2 % EX PADS
6.0000 | MEDICATED_PAD | Freq: Every day | CUTANEOUS | Status: DC
  Administered 2020-08-13 – 2020-08-18 (×6): 6 via TOPICAL

## 2020-08-13 MED ORDER — SODIUM CHLORIDE 0.9 % IV SOLN: 2.0000 g | INTRAVENOUS | Status: DC

## 2020-08-13 MED ORDER — ONDANSETRON 4 MG PO TBDP
4.0000 mg | ORAL_TABLET | Freq: Four times a day (QID) | ORAL | Status: DC | PRN
  Administered 2020-08-18: 4 mg via ORAL
  Filled 2020-08-13: qty 1

## 2020-08-13 MED ORDER — OXYCODONE HCL 5 MG PO TABS: 5.0000 mg | ORAL_TABLET | ORAL | Status: DC | PRN

## 2020-08-13 MED ORDER — ACETAMINOPHEN 500 MG PO TABS
1000.0000 mg | ORAL_TABLET | Freq: Four times a day (QID) | ORAL | Status: DC
  Administered 2020-08-16 – 2020-08-17 (×4): 1000 mg via ORAL
  Filled 2020-08-13 (×8): qty 2

## 2020-08-13 MED ORDER — DOCUSATE SODIUM 100 MG PO CAPS
100.0000 mg | ORAL_CAPSULE | Freq: Two times a day (BID) | ORAL | Status: DC
  Filled 2020-08-13: qty 1

## 2020-08-13 MED ORDER — SODIUM CHLORIDE 0.9 % IV BOLUS
1000.0000 mL | Freq: Once | INTRAVENOUS | Status: AC
  Administered 2020-08-13: 1000 mL via INTRAVENOUS

## 2020-08-13 MED ORDER — PHENOL 1.4 % MT LIQD
1.0000 | OROMUCOSAL | Status: DC | PRN
  Filled 2020-08-13 (×2): qty 177

## 2020-08-13 NOTE — ED Notes (Signed)
NG tube advanced 8cm per abd xray results, xray reordered to verify

## 2020-08-13 NOTE — ED Provider Notes (Signed)
[name] EMERGENCY DEPARTMENT Provider Note   CSN: [id number] Arrival date & time: 08/13/20  0116     History Chief Complaint  Patient presents with   Loss of Consciousness    Keith [name] is a 80 y.o. male.  80 yo M with a significant past medical history of colon cancer status post right hemicolectomy about a week ago comes in with a chief complaint of a syncopal event.  Patient stood up to try and use the bathroom and ended up collapsing to the ground.  Found to be hypotensive with EMS.  Now feels much better.  Denies any injury in the fall.  Has been having some abdominal discomfort post surgery but does not really have any now.  Denies any nausea or vomiting.  Denies fevers.  Has not really been able to eat and drink.  He is unable to quantify this.  He is not sure why he is not eating or drinking.  The history is provided by the patient.  Loss of Consciousness Episode history:  Single Most recent episode:  2 days ago Duration:  2 minutes Timing:  Rare Progression:  Resolved Chronicity:  New Witnessed: yes   Relieved by:  Certain positions Worsened by:  Nothing Ineffective treatments:  None tried Associated symptoms: no chest pain, no confusion, no fever, no headaches, no nausea, no palpitations, no shortness of breath and no vomiting       Past Medical History:  Diagnosis Date   Anemia    Cataract    planning surgery   Chronic kidney disease    "weak Kidneys"   Complication of anesthesia    patient expressed that he has difficulty post anesthesia with swallowing, chewing, and talking after surgery   Dysrhythmia    GERD (gastroesophageal reflux disease)    tums prn   Glaucoma    History of hiatal hernia    Hypothyroidism    Hypothyroidism, postsurgical 11/20/2016   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   Melanoma ([place])    scalp   Melanoma of scalp ([place]) 04/23/2017   Obstructive uropathy    PONV (postoperative nausea and  vomiting) 03/20/2017   Stroke ([place]) 11/2016   speech and right side at time of stroke 04/20/17- all function return to normal   Underweight 01/24/2018    Patient Active Problem List   Diagnosis Date Noted   Protein-calorie malnutrition, severe 08/11/2020   Cancer of transverse colon ([place]) 08/06/2020   Coronary artery calcification 05/25/2020   Adenocarcinoma ([place]) 05/22/2019   Mass of colon    Symptomatic anemia 05/08/2019   Gastroesophageal reflux disease 02/18/2019   Iron deficiency 09/26/2018   Anemia in stage 4 chronic kidney disease ([place]) 05/21/2018   Vitamin D deficiency 05/21/2018   History of CVA (cerebrovascular accident) 01/31/2018   TSH elevation 01/28/2018   Underweight 01/24/2018   CKD (chronic kidney disease) stage 4, GFR 15-29 ml/min (HCC) 12/10/2017   Hypotension 12/10/2017   Paroxysmal atrial fibrillation ([place]) 11/07/2017   Bilateral carotid artery stenosis 09/03/2017   Malignant melanoma of skin of scalp ([place]) 03/26/2017   Hyperlipidemia 02/08/2017   SAH (subarachnoid hemorrhage) ([place]) 11/20/2016   Cerebrovascular accident (CVA) due to occlusion of left carotid artery ([place]) 11/20/2016    Past Surgical History:  Procedure Laterality Date   APPLICATION OF A-CELL OF HEAD/NECK N/A 04/23/2017   Procedure: APPLICATION OF INTREGRA;  Surgeon: Irene Limbo, MD;  Location: [facility];  Service: Plastics;  Laterality: N/A;   BIOPSY  05/10/2019   Procedure: BIOPSY;  Surgeon: [name], MD;  Location: [facility];  Service: Endoscopy;;   CATARACT EXTRACTION     COLONOSCOPY WITH PROPOFOL N/A 05/10/2019   Procedure: COLONOSCOPY WITH PROPOFOL;  Surgeon: [name], MD;  Location: [facility] ENDOSCOPY;  Service: Endoscopy;  Laterality: N/A;   ESOPHAGOGASTRODUODENOSCOPY (EGD) WITH PROPOFOL N/A 05/10/2019   Procedure: ESOPHAGOGASTRODUODENOSCOPY (EGD) WITH PROPOFOL;  Surgeon: [name], MD;  Location: [facility]COPY;  Service: Endoscopy;  Laterality: N/A;   EXCISION MASS HEAD N/A  03/20/2017   Procedure: EXCISION scalp lesion;  Surgeon: Irene Limbo, MD;  Location: [facility];  Service: Plastics;  Laterality: N/A;  EXCISION scalp lesion   EYE SURGERY     IR ANGIO INTRA EXTRACRAN SEL COM CAROTID INNOMINATE BILAT MOD SED  11/23/2016   IR ANGIO VERTEBRAL SEL VERTEBRAL UNI L MOD SED  11/23/2016   LOOP RECORDER INSERTION N/A 11/23/2016   Procedure: LOOP RECORDER INSERTION;  Surgeon: [name], MD;  Location: [facility] CV LAB;  Service: Cardiovascular;  Laterality: N/A;   MASS EXCISION N/A 04/23/2017   Procedure: EXCISION MALIGNANT LESION OF SCALP;  Surgeon: Irene Limbo, MD;  Location: [place] [facility];  Service: Plastics;  Laterality: N/A;   MELANOMA EXCISION  04/23/2017   BILATERAL SUPERFICIAL PAROTIDECTOMY/NECK SENTINEL NODE BIOPSY WITH FROZEN SECTION; LEFT SELECTIVE NECK DISSECTION   PAROTIDECTOMY Bilateral 04/23/2017   Procedure: BILATERAL SUPERFICIAL PAROTIDECTOMY/NECK SENTINEL NODE BIOPSY WITH FROZEN SECTION; LEFT SELECTIVE NECK DISSECTION;  Surgeon: [name], MD;  Location: [facility];  Service: ENT;  Laterality: Bilateral;   POLYPECTOMY  05/10/2019   Procedure: POLYPECTOMY;  Surgeon: [name], MD;  Location: [facility];  Service: Endoscopy;;   SKIN GRAFT SPLIT THICKNESS HEAD / NECK  06/04/2017   SKIN GRAFT SPLIT THICKNESS TO SCALP FROM RIGHT OR LEFT THIGH POSSIBLE A CELL TO DONOR SITE   SKIN SPLIT GRAFT N/A 06/04/2017   Procedure: SKIN GRAFT SPLIT THICKNESS TO SCALP FROM RIGHT OR LEFT THIGH POSSIBLE A CELL TO DONOR SITE;  Surgeon: Irene Limbo, MD;  Location: [facility];  Service: Plastics;  Laterality: N/A;   SUBMUCOSAL TATTOO INJECTION  05/10/2019   Procedure: SUBMUCOSAL TATTOO INJECTION;  Surgeon: [name], MD;  Location[facility] END[facility]COPY;  Service: Endoscopy;;   THYROIDECTOMY     TONSILLECTOMY         Family History  Problem Relation Age of Onset   Lung cancer Father    Lung cancer Mother    Diabetes Brother    Colon cancer Neg Hx    Colon polyps Neg Hx     Esophageal cancer Neg Hx    Stomach cancer Neg Hx    Pancreatic cancer Neg Hx     Social History   Tobacco Use   Smoking status: Former    Packs/day: 2.00    Years: 32.00    Pack years: 64.00    Types: Cigarettes    Quit date: 1983    Years since quitting: 39.6   Smokeless tobacco: Never   Tobacco comments:    No plans to start  Vaping Use   Vaping Use: Never used  Substance Use Topics   Alcohol use: No   Drug use: No    Home Medications Prior to Admission medications   Medication Sig Start Date End Date Taking? Authorizing Provider  acetaminophen (TYLENOL) 500 MG tablet Take 2 tablets  by mouth every 6  hours. 1/49/70   Leighton Ruff, MD  apixaban (ELIQUIS) 2.5 MG TABS tablet Take 1 tablet (2.5 mg  total) by mouth 2 (two) times daily. 04/26/20   Shirley Friar, PA-C  atorvastatin (LIPITOR) 40 MG tablet TAKE 1 TABLET (40 MG TOTAL) BY MOUTH DAILY AT 6 PM. 03/28/19   Bonnita Hollow, MD  cholecalciferol (VITAMIN D3) 25 MCG (1000 UNIT) tablet Take 1,000 Units by mouth daily.    [provider]  Dapsone 5 % topical gel Apply 1 application topically 2 (two) times a week. 02/08/18   [provider]  famotidine (PEPCID) 20 MG tablet TAKE 1 TABLET BY MOUTH TWICE A DAY 05/12/19   Bonnita Hollow, MD  ferrous sulfate 325 (65 FE) MG tablet Take 325 mg by mouth daily.     [provider]  loperamide (IMODIUM) 2 MG capsule Take 1 capsule  by mouth 2 times daily as needed for diarrhea or loose stools. 5/95/63   Leighton Ruff, MD  Multiple Vitamin (MULTIVITAMIN) capsule Take 1 capsule by mouth daily.     [provider]  tamsulosin (FLOMAX) 0.4 MG CAPS capsule Take 1 capsule (0.4 mg total) by mouth daily. 8/75/64   Leighton Ruff, MD  timolol (TIMOPTIC) 0.5 % ophthalmic solution Place 1 drop into both eyes 2 (two) times daily.  11/24/17   [provider]    Allergies    Penicillins  Review of Systems   Review of Systems   Constitutional:  Positive for appetite change. Negative for chills and fever.  HENT:  Negative for congestion and facial swelling.   Eyes:  Negative for discharge and visual disturbance.  Respiratory:  Negative for shortness of breath.   Cardiovascular:  Positive for syncope. Negative for chest pain and palpitations.  Gastrointestinal:  Positive for abdominal pain (severe earlier but has resolved). Negative for diarrhea, nausea and vomiting.  Musculoskeletal:  Negative for arthralgias and myalgias.  Skin:  Negative for color change and rash.  Neurological:  Negative for tremors, syncope and headaches.  Psychiatric/Behavioral:  Negative for confusion and dysphoric mood.    Physical Exam Updated Vital Signs BP (!) 127/45   Pulse 69   Temp (!) 97.3 F (36.3 C) (Oral)   Resp 16   Ht 5\' 9"  (1.753 m)   Wt 50.2 kg   SpO2 97%   BMI 16.34 kg/m   Physical Exam Vitals and nursing note reviewed.  Constitutional:      Appearance: He is well-developed.  HENT:     Head: Normocephalic and atraumatic.  Eyes:     Pupils: Pupils are equal, round, and reactive to light.  Neck:     Vascular: No JVD.  Cardiovascular:     Rate and Rhythm: Normal rate and regular rhythm.     Heart sounds: No murmur heard.   No friction rub. No gallop.  Pulmonary:     Effort: No respiratory distress.     Breath sounds: No wheezing.  Abdominal:     General: There is distension (tympanitic on percussion).     Tenderness: There is no abdominal tenderness. There is no guarding or rebound.  Musculoskeletal:        General: Normal range of motion.     Cervical back: Normal range of motion and neck supple.  Skin:    Coloration: Skin is not pale.     Findings: No rash.  Neurological:     Mental Status: He is alert and oriented to person, place, and time.  Psychiatric:        Behavior: Behavior normal.    ED Results / Procedures /  Treatments   Labs (all labs ordered are listed, but only abnormal results are  displayed) Labs Reviewed  CBC WITH DIFFERENTIAL/PLATELET - Abnormal; Notable for the following components:      Result Value   RBC 3.62 (*)    Hemoglobin 10.1 (*)    HCT 34.0 (*)    MCHC 29.7 (*)    Abs Immature Granulocytes 0.08 (*)    All other components within normal limits  COMPREHENSIVE METABOLIC PANEL - Abnormal; Notable for the following components:   CO2 20 (*)    Glucose, Bld 132 (*)    BUN 30 (*)    Creatinine, Ser 2.87 (*)    Calcium 8.8 (*)    Total Protein 5.8 (*)    Albumin 2.5 (*)    GFR, Estimated 22 (*)    Anion gap 16 (*)    All other components within normal limits  URINALYSIS, ROUTINE W REFLEX MICROSCOPIC - Abnormal; Notable for the following components:   Hgb urine dipstick SMALL (*)    Ketones, ur 20 (*)    Protein, ur 30 (*)    Leukocytes,Ua TRACE (*)    Bacteria, UA RARE (*)    All other components within normal limits  RESP PANEL BY RT-PCR (FLU A&B, COVID) ARPGX2  CULTURE, BLOOD (ROUTINE X 2)  CULTURE, BLOOD (ROUTINE X 2)  LACTIC ACID, PLASMA  LACTIC ACID, PLASMA    EKG EKG Interpretation  Date/Time:  Friday August 13 2020 01:24:22 EDT Ventricular Rate:  103 PR Interval:  160 QRS Duration: 138 QT Interval:  382 QTC Calculation: 501 R Axis:   -58 Text Interpretation: Sinus tachycardia RBBB and LAFB No significant change since 05/10/19 Confirmed by Deno Etienne [phone]) on 08/13/2020 1:27:49 AM  Radiology CT ABDOMEN PELVIS WO CONTRAST  Result Date: 08/13/2020 CLINICAL DATA:  80 year old male with syncope while trying to use bathroom. History of proximal transverse colon mass. Postoperative day 7 status post robot assisted right colectomy. EXAM: CT ABDOMEN AND PELVIS WITHOUT CONTRAST TECHNIQUE: Multidetector CT imaging of the abdomen and pelvis was performed following the standard protocol without IV contrast. COMPARISON:  CT Abdomen and Pelvis 06/24/2020 and earlier. FINDINGS: Lower chest: Lower chest wall subcutaneous gas more so on the left. No  lung base pneumothorax. No pericardial or pleural effusion. Lung bases remain clear. Hepatobiliary: Non-contrast liver surrounded by free intraperitoneal air, but otherwise negative. Gallbladder appears within normal limits. Pancreas: Negative. Spleen: Negative. Adrenals/Urinary Tract: Bilateral renal atrophy. There is mild right hydroureter today which appears to be secondary to mass effect from abnormal small bowel detailed below. Bladder mostly decompressed by Foley catheter. Stomach/Bowel: Positive pneumoperitoneum. There is a small to moderate volume of free air in the upper abdomen mostly under the central diaphragm and in the ventral peritoneal cavity but also scattered in the mesentery. Associated ventral abdominal wall subcutaneous gas. Decompressed large bowel from the splenic flexure distally. Interval right hemicolectomy with a mid transverse to small bowel anastomosis on series 3, image 30. The small bowel just proximal to the anastomosis is decompressed. But elsewhere there are numerous fluid-filled dilated small bowel loops throughout the abdomen and pelvis individually measuring 4-4.5 cm diameter. There is a fairly abrupt transition in the right abdomen on coronal image 34 and series 3, image 49, located about 12-14 cm proximal to the anastomosis. There is some mesenteric swirling in this region. And there is asymmetric mesenteric stranding among the small bowel loops in the right abdomen here on series 3, image 42. But only  trace if any free fluid. Duodenum is relatively decompressed. Stomach is mildly distended with air and fluid. Vascular/Lymphatic: Extensive Aortoiliac calcified atherosclerosis. Vascular patency is not evaluated in the absence of IV contrast. No lymphadenopathy. Reproductive: Urethral catheter in place. Subcutaneous gas tracking from the abdominal wall into the left hemiscrotum along the inguinal canal. Other: No pelvic free fluid. Musculoskeletal: Osteopenia. No acute or  suspicious osseous lesion identified. IMPRESSION: 1. Moderate volume of free intraperitoneal air - with ventral abdominal wall subcutaneous gas, is more than expected from 7 days postop right colectomy and suspicious for perforated bowel or anastomosis. However, there is no definite associated free fluid. 2. Superimposed High-grade Small Bowel Obstruction with an abrupt transition point in the right abdomen about 12-14 cm proximal to the small to large bowel anastomosis - where a mildly swirled appearance of the mesentery and regionally increased inflammation of obstructed small bowel loops raises the possibility of an Internal Hernia. 3. Negative lung bases. No pleural effusion. Aortic Atherosclerosis (ICD10-I70.0). Study discussed by telephone with Dr. Linna Hoff Sibel Khurana on 08/13/2020 at 05:03 . Electronically Signed   By: Genevie Ann M.D.   On: 08/13/2020 05:06    Procedures Procedures   Medications Ordered in ED Medications  metroNIDAZOLE (FLAGYL) IVPB 500 mg (0 mg Intravenous Stopped 08/13/20 0633)  sodium chloride 0.9 % bolus 1,000 mL (0 mLs Intravenous Stopped 08/13/20 0232)  ceFEPIme (MAXIPIME) 2 g in sodium chloride 0.9 % 100 mL IVPB (0 g Intravenous Stopped 08/13/20 9201)    ED Course  I have reviewed the triage vital signs and the nursing notes.  Pertinent labs & imaging results that were available during my care of the patient were reviewed by me and considered in my medical decision making (see chart for details).    MDM Rules/Calculators/A&P                           80 yo M with a chief complaints of a syncopal event.  Patient endorsing not having much oral intake for the past week after having a right-sided hemicolectomy.  Does have some abdominal distention and is tympanitic on percussion.  Will obtain a CT scan to evaluate for obstruction.  I called by the radiologist to discuss the CT scan.  CT scan is concerning for obstruction and there is some question of possible bowel perforation with  more air than would be anticipated 7 days out of procedure.  Obstruction thought to be caused by an internal hernia.  Will start on broad-spectrum antibiotics.  Discussed with general surgery, Dr. Bobbye Morton will admit.  CRITICAL CARE Performed by: Cecilio Asper   Total critical care time: 35 minutes  Critical care time was exclusive of separately billable procedures and treating other patients.  Critical care was necessary to treat or prevent imminent or life-threatening deterioration.  Critical care was time spent personally by me on the following activities: development of treatment plan with patient and/or surrogate as well as nursing, discussions with consultants, evaluation of patient's response to treatment, examination of patient, obtaining history from patient or surrogate, ordering and performing treatments and interventions, ordering and review of laboratory studies, ordering and review of radiographic studies, pulse oximetry and re-evaluation of patient's condition.  The patients results and plan were reviewed and discussed.   Any x-rays performed were independently reviewed by myself.   Differential diagnosis were considered with the presenting HPI.  Medications  metroNIDAZOLE (FLAGYL) IVPB 500 mg (0 mg Intravenous Stopped  08/13/20 7001)  sodium chloride 0.9 % bolus 1,000 mL (0 mLs Intravenous Stopped 08/13/20 0232)  ceFEPIme (MAXIPIME) 2 g in sodium chloride 0.9 % 100 mL IVPB (0 g Intravenous Stopped 08/13/20 0633)    Vitals:   08/13/20 0500 08/13/20 0530 08/13/20 0600 08/13/20 0630  BP: (!) 123/58 (!) 124/54 (!) 119/46 (!) 127/45  Pulse: 92 65 69 69  Resp: 15 16 18 16   Temp:      TempSrc:      SpO2: 97% 96% 97% 97%  Weight:      Height:        Final diagnoses:  Syncope and collapse  SBO (small bowel obstruction) ([place])    Admission/ observation were discussed with the admitting physician, patient and/or family and they are comfortable with the plan.    Final  Clinical Impression(s) / ED Diagnoses Final diagnoses:  Syncope and collapse  SBO (small bowel obstruction) [facility])    Rx / DC Orders ED Discharge Orders     None        Deno Etienne, DO 08/13/20 [id number]

## 2020-08-13 NOTE — ED Notes (Signed)
NG advanced additional 5cm, secured and attached to low intermit suction

## 2020-08-13 NOTE — ED Notes (Signed)
[facility] contacted for transport  

## 2020-08-13 NOTE — ED Notes (Signed)
Dr. Bobbye Morton made aware that pt is on Eliquis and last dose was yesterday AM.

## 2020-08-13 NOTE — Progress Notes (Signed)
Checked on patient this afternoon.  He has normal vital signs and no abdominal pain.  Hasn't had pain meds in several hours with no return of pain at this time.  His NGT is in place with about 400-500cc of output out currently.  His abdominal exam is very benign currently.  Continue conservative management at this time.  D/W Dr. Marcello Moores.  Henreitta Cea 2:33 PM 08/13/2020

## 2020-08-13 NOTE — ED Triage Notes (Signed)
Pt coming from home after 2 min witnessed syncopal episode. Pt was attempting to get up to use bathroom and passed out (daughter with pt). Pt found to be hypotensive at 80/40 with EMS. Pt recently d/c from hospital for surgery (removal of carcinoma from top of head). Pt with indwelling catheter in place; no bowel movement since his d/c.

## 2020-08-13 NOTE — Progress Notes (Signed)
Pharmacy Antibiotic Note  Keith [name] is a 80 y.o. male admitted on 08/13/2020 with  IAI .  Pharmacy has been consulted for cefepime dosing.  Plan: Cefepime 2g IV q24h -Monitor renal function, clinical status, and antibiotic plan  Height: 5\' 9"  (175.3 cm) Weight: 50.2 kg (110 lb 10.7 oz) IBW/kg (Calculated) : 70.7  Temp (24hrs), Avg:97.3 F (36.3 C), Min:97.3 F (36.3 C), Max:97.3 F (36.3 C)  Recent Labs  Lab 08/07/20 0350 08/07/20 0958 08/08/20 0439 08/09/20 0354 08/13/20 0129 08/13/20 0223  WBC 8.7 9.3 8.1 7.9 9.4  --   CREATININE 2.36*  --  2.29* 2.48* 2.87*  --   LATICACIDVEN  --   --   --   --   --  1.0    Estimated Creatinine Clearance: 15.1 mL/min (A) (by C-G formula based on SCr of 2.87 mg/dL (H)).    Allergies  Allergen Reactions   Penicillins Other (See Comments)    UNKNOWN REACTION FROM CHILDHOOD  Has patient had a PCN reaction causing immediate rash, facial/tongue/throat swelling, SOB or lightheadedness with hypotension: Unknown Has patient had a PCN reaction causing severe rash involving mucus membranes or skin necrosis: Unknown Has patient had a PCN reaction that required hospitalization: Unknown Has patient had a PCN reaction occurring within the last 10 years: No If all of the above answers are "NO", then may proceed with Cephalosporin use.     Antimicrobials this admission: Cefepime 7/29 >>  Flagyl 7/29 >>   Dose adjustments this admission: N/A  Microbiology results: 7/29 BCx:   Thank you for allowing pharmacy to be a part of this patient's care.  Joetta Manners, PharmD, [facility] Emergency Medicine Clinical Pharmacist ED RPh Phone: [place]: [phone]

## 2020-08-13 NOTE — H&P (Signed)
Reason for Consult/Chief Complaint:  SBO, question of internal hernia Consultant: Tyrone Nine, DO  Keith [name] is an 80 y.o. male.   HPI: 42M s/p robotic R colectomy on 7/22 for malignancy, discharged 7/27. Was eating poorly at home and had a brief, witnessed syncopal episode at home when attempting to have a bowel movement/ Denies any pain, n/v at home. Was having loose stools prior to discharge being managed with imodium. Denies having a bowel movement since discharge, reports last BM 0500 on the day of discharge.  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   Melanoma ([place])    scalp   Melanoma of scalp ([place]) 04/23/2017   Obstructive uropathy    PONV (postoperative nausea and vomiting) 03/20/2017   Stroke ([place]) 11/2016   speech and right side at time of stroke 04/20/17- all function return to normal   Underweight 01/24/2018    Past Surgical History:  Procedure Laterality Date   APPLICATION OF A-CELL OF HEAD/NECK N/A 04/23/2017   Procedure: APPLICATION OF INTREGRA;  Surgeon: Irene Limbo, MD;  Location: [facility];  Service: Plastics;  Laterality: N/A;   BIOPSY  05/10/2019   Procedure: BIOPSY;  Surgeon: [name], MD;  Location: [place];  Service: Endoscopy;;   CATARACT EXTRACTION     COLONOSCOPY WITH PROPOFOL N/A 05/10/2019   Procedure: COLONOSCOPY WITH PROPOFOL;  Surgeon: [name], MD;  Location: [facility] ENDOSCOPY;  Service: Endoscopy;  Laterality: N/A;   ESOPHAGOGASTRODUODENOSCOPY (EGD) WITH PROPOFOL N/A 05/10/2019   Procedure:  ESOPHAGOGASTRODUODENOSCOPY (EGD) WITH PROPOFOL;  Surgeon: [name], MD;  Location: [facility] ENDOSCOPY;  Service: Endoscopy;  Laterality: N/A;   EXCISION MASS HEAD N/A 03/20/2017   Procedure: EXCISION scalp lesion;  Surgeon: Irene Limbo, MD;  Location: [place];  Service: Plastics;  Laterality: N/A;  EXCISION scalp lesion   EYE SURGERY     IR ANGIO INTRA EXTRACRAN SEL COM CAROTID INNOMINATE BILAT MOD SED  11/23/2016   IR ANGIO VERTEBRAL SEL VERTEBRAL UNI L MOD SED  11/23/2016   LOOP RECORDER INSERTION N/A 11/23/2016   Procedure: LOOP RECORDER INSERTION;  Surgeon: [name], MD;  Location: [place] CV LAB;  Service: Cardiovascular;  Laterality: N/A;   MASS EXCISION N/A 04/23/2017   Procedure: EXCISION MALIGNANT LESION OF SCALP;  Surgeon: Irene Limbo, MD;  Location: [place];  Service: Plastics;  Laterality: N/A;   MELANOMA EXCISION  04/23/2017   BILATERAL SUPERFICIAL PAROTIDECTOMY/NECK SENTINEL NODE BIOPSY WITH FROZEN SECTION; LEFT SELECTIVE NECK DISSECTION   PAROTIDECTOMY Bilateral 04/23/2017   Procedure: BILATERAL SUPERFICIAL PAROTIDECTOMY/NECK SENTINEL NODE BIOPSY WITH FROZEN SECTION; LEFT SELECTIVE NECK DISSECTION;  Surgeon: [name], MD;  Location: [facility];  Service: ENT;  Laterality: Bilateral;   POLYPECTOMY  05/10/2019   Procedure: POLYPECTOMY;  Surgeon: [name], MD;  Location: [place];  Service: Endoscopy;;   SKIN GRAFT SPLIT THICKNESS HEAD / NECK  06/04/2017   SKIN GRAFT SPLIT THICKNESS TO SCALP FROM RIGHT OR LEFT THIGH POSSIBLE A CELL TO [place]  SKIN SPLIT GRAFT N/A 06/04/2017   Procedure: SKIN GRAFT SPLIT THICKNESS TO SCALP FROM RIGHT OR LEFT THIGH POSSIBLE A CELL TO DONOR SITE;  Surgeon: Irene Limbo, MD;  Location: [place];  Service: Plastics;  Laterality: N/A;   SUBMUCOSAL TATTOO INJECTION  05/10/2019   Procedure: SUBMUCOSAL TATTOO INJECTION;  Surgeon: [name], MD;  Location: [facility] ENDOSCOPY;  Service: Endoscopy;;   THYROIDECTOMY     TONSILLECTOMY       Family History  Problem Relation Age of Onset   Lung cancer Father    Lung cancer Mother    Diabetes Brother    Colon cancer Neg Hx    Colon polyps Neg Hx    Esophageal cancer Neg Hx    Stomach cancer Neg Hx    Pancreatic cancer Neg Hx     Social History:  reports that he quit smoking about 39 years ago. His smoking use included cigarettes. He has a 64.00 pack-year smoking history. He has never used smokeless tobacco. He reports that he does not drink alcohol and does not use drugs.  Allergies:  Allergies  Allergen Reactions   Penicillins Other (See Comments)    UNKNOWN REACTION FROM CHILDHOOD  Has patient had a PCN reaction causing immediate rash, facial/tongue/throat swelling, SOB or lightheadedness with hypotension: Unknown Has patient had a PCN reaction causing severe rash involving mucus membranes or skin necrosis: Unknown Has patient had a PCN reaction that required hospitalization: Unknown Has patient had a PCN reaction occurring within the last 10 years: No If all of the above answers are "NO", then may proceed with Cephalosporin use.     Medications: I have reviewed the patient's current medications.  Results for orders placed or performed during the hospital encounter of 08/13/20 (from the past 48 hour(s))  CBC with Differential     Status: Abnormal   Collection Time: 08/13/20  1:29 AM  Result Value Ref Range   WBC 9.4 4.0 - 10.5 K/uL   RBC 3.62 (L) 4.22 - 5.81 MIL/uL   Hemoglobin 10.1 (L) 13.0 - 17.0 g/dL   HCT 34.0 (L) 39.0 - 52.0 %   MCV 93.9 80.0 - 100.0 fL   MCH 27.9 26.0 - 34.0 pg   MCHC 29.7 (L) 30.0 - 36.0 g/dL   RDW 14.0 11.5 - 15.5 %   Platelets 291 150 - 400 K/uL   nRBC 0.0 0.0 - 0.2 %   Neutrophils Relative % 82 %   Neutro Abs 7.7 1.7 - 7.7 K/uL   Lymphocytes Relative 7 %   Lymphs Abs 0.7 0.7 - 4.0 K/uL   Monocytes Relative 8 %   Monocytes Absolute 0.7 0.1 - 1.0 K/uL   Eosinophils Relative 2 %   Eosinophils Absolute 0.2 0.0 - 0.5 K/uL    Basophils Relative 0 %   Basophils Absolute 0.0 0.0 - 0.1 K/uL   Immature Granulocytes 1 %   Abs Immature Granulocytes 0.08 (H) 0.00 - 0.07 K/uL    Comment: Performed at [facility], [address] [place], [place] [address]  Comprehensive metabolic panel     Status: Abnormal   Collection Time: 08/13/20  1:29 AM  Result Value Ref Range   Sodium 136 135 - 145 mmol/L   Potassium 4.6 3.5 - 5.1 mmol/L   Chloride 100 98 - 111 mmol/L   CO2 20 (L) 22 - 32 mmol/L   Glucose, Bld 132 (H) 70 - 99 mg/dL    Comment: Glucose reference range applies only to  samples taken after fasting for at least 8 hours.   BUN 30 (H) 8 - 23 mg/dL   Creatinine, Ser 2.87 (H) 0.61 - 1.24 mg/dL   Calcium 8.8 (L) 8.9 - 10.3 mg/dL   Total Protein 5.8 (L) 6.5 - 8.1 g/dL   Albumin 2.5 (L) 3.5 - 5.0 g/dL   AST 20 15 - 41 U/L   ALT 13 0 - 44 U/L   Alkaline Phosphatase 60 38 - 126 U/L   Total Bilirubin 0.7 0.3 - 1.2 mg/dL   GFR, Estimated 22 (L) >60 mL/min    Comment: (NOTE) Calculated using the CKD-EPI Creatinine Equation (2021)    Anion gap 16 (H) 5 - 15    Comment: Performed at [facility], [place] [address] [place], [place] [address]  Urinalysis, Routine w reflex microscopic Urine, Catheterized     Status: Abnormal   Collection Time: 08/13/20  2:23 AM  Result Value Ref Range   Color, Urine YELLOW YELLOW   APPearance CLEAR CLEAR   Specific Gravity, Urine 1.023 1.005 - 1.030   pH 5.0 5.0 - 8.0   Glucose, UA NEGATIVE NEGATIVE mg/dL   Hgb urine dipstick SMALL (A) NEGATIVE   Bilirubin Urine NEGATIVE NEGATIVE   Ketones, ur 20 (A) NEGATIVE mg/dL   Protein, ur 30 (A) NEGATIVE mg/dL   Nitrite NEGATIVE NEGATIVE   Leukocytes,Ua TRACE (A) NEGATIVE   RBC / HPF 11-20 0 - 5 RBC/hpf   WBC, UA 0-5 0 - 5 WBC/hpf   Bacteria, UA RARE (A) NONE SEEN   Squamous Epithelial / LPF 0-5 0 - 5   Mucus PRESENT    Hyaline Casts, UA PRESENT     Comment: Performed at [facility], [address], [place], [place]  [address]  Lactic acid, plasma     Status: None   Collection Time: 08/13/20  2:23 AM  Result Value Ref Range   Lactic Acid, Venous 1.0 0.5 - 1.9 mmol/L    Comment: Performed at [facility] [address] [place], [place] [address]    CT ABDOMEN PELVIS WO CONTRAST  Result Date: 08/13/2020 CLINICAL DATA:  80 year old male with syncope while trying to use bathroom. History of proximal transverse colon mass. Postoperative day 7 status post robot assisted right colectomy. EXAM: CT ABDOMEN AND PELVIS WITHOUT CONTRAST TECHNIQUE: Multidetector CT imaging of the abdomen and pelvis was performed following the standard protocol without IV contrast. COMPARISON:  CT Abdomen and Pelvis 06/24/2020 and earlier. FINDINGS: Lower chest: Lower chest wall subcutaneous gas more so on the left. No lung base pneumothorax. No pericardial or pleural effusion. Lung bases remain clear. Hepatobiliary: Non-contrast liver surrounded by free intraperitoneal air, but otherwise negative. Gallbladder appears within normal limits. Pancreas: Negative. Spleen: Negative. Adrenals/Urinary Tract: Bilateral renal atrophy. There is mild right hydroureter today which appears to be secondary to mass effect from abnormal small bowel detailed below. Bladder mostly decompressed by Foley catheter. Stomach/Bowel: Positive pneumoperitoneum. There is a small to moderate volume of free air in the upper abdomen mostly under the central diaphragm and in the ventral peritoneal cavity but also scattered in the mesentery. Associated ventral abdominal wall subcutaneous gas. Decompressed large bowel from the splenic flexure distally. Interval right hemicolectomy with a mid transverse to small bowel anastomosis on series 3, image 30. The small bowel just proximal to the anastomosis is decompressed. But elsewhere there are numerous fluid-filled dilated small bowel loops throughout the abdomen and pelvis individually measuring 4-4.5 cm diameter.  There is a fairly  abrupt transition in the right abdomen on coronal image 34 and series 3, image 49, located about 12-14 cm proximal to the anastomosis. There is some mesenteric swirling in this region. And there is asymmetric mesenteric stranding among the small bowel loops in the right abdomen here on series 3, image 42. But only trace if any free fluid. Duodenum is relatively decompressed. Stomach is mildly distended with air and fluid. Vascular/Lymphatic: Extensive Aortoiliac calcified atherosclerosis. Vascular patency is not evaluated in the absence of IV contrast. No lymphadenopathy. Reproductive: Urethral catheter in place. Subcutaneous gas tracking from the abdominal wall into the left hemiscrotum along the inguinal canal. Other: No pelvic free fluid. Musculoskeletal: Osteopenia. No acute or suspicious osseous lesion identified. IMPRESSION: 1. Moderate volume of free intraperitoneal air - with ventral abdominal wall subcutaneous gas, is more than expected from 7 days postop right colectomy and suspicious for perforated bowel or anastomosis. However, there is no definite associated free fluid. 2. Superimposed High-grade Small Bowel Obstruction with an abrupt transition point in the right abdomen about 12-14 cm proximal to the small to large bowel anastomosis - where a mildly swirled appearance of the mesentery and regionally increased inflammation of obstructed small bowel loops raises the possibility of an Internal Hernia. 3. Negative lung bases. No pleural effusion. Aortic Atherosclerosis (ICD10-I70.0). Study discussed by telephone with Dr. Linna Hoff FLOYD on 08/13/2020 at 05:03 . Electronically Signed   By: Genevie Ann M.D.   On: 08/13/2020 05:06    ROS 10 point review of systems is negative except as listed above in HPI.   Physical Exam Blood pressure (!) 123/58, pulse 92, temperature (!) 97.3 F (36.3 C), temperature source Oral, resp. rate 15, height 5\' 9"  (1.753 m), weight 50.2 kg, SpO2 97 %. Constitutional:  well-developed, well-nourished HEENT: pupils equal, round, reactive to light, 79mm b/l, moist conjunctiva, external inspection of ears and nose normal, hearing diminished but intact Oropharynx: normal oropharyngeal mucosa, normal dentition Neck: no thyromegaly, trachea midline, no midline cervical tenderness to palpation Chest: breath sounds equal bilaterally, normal respiratory effort, no midline or lateral chest wall tenderness to palpation/deformity Abdomen: soft, NT, incisions c/d/i, mildly distended, tympanitic, bruising of L flank, no hepatosplenomegaly GU: no blood at urethral meatus of penis, no scrotal masses or abnormality  Back: no wounds, no thoracic/lumbar spine tenderness to palpation, no thoracic/lumbar spine stepoffs Rectal: deferred Extremities: 2+ radial and pedal pulses bilaterally, motor and sensation intact to bilateral UE and LE, no peripheral edema MSK: unable to assess gait/station, no clubbing/cyanosis of fingers/toes, normal ROM of all four extremities Skin: warm, dry, no rashes Psych: normal memory, normal mood/affect    Assessment/Plan: 66M one week s/p robotic R hemicolectomy with primary anastomosis presents with SBO and question of internal hernia on imaging. Lactate normal and abdominal exam benign. Discussed with original surgeon, Dr. Marcello Moores, and plan for transfer to [facility] for admission and management.    Jesusita Oka, MD General and [facility]

## 2020-08-13 NOTE — ED Notes (Signed)
1st lactic acid normal, 2nd to be discontinued.

## 2020-08-14 ENCOUNTER — Inpatient Hospital Stay (HOSPITAL_COMMUNITY): Payer: Medicare Other

## 2020-08-14 LAB — BASIC METABOLIC PANEL
Anion gap: 12 (ref 5–15)
BUN: 31 mg/dL — ABNORMAL HIGH (ref 8–23)
CO2: 20 mmol/L — ABNORMAL LOW (ref 22–32)
Calcium: 8.3 mg/dL — ABNORMAL LOW (ref 8.9–10.3)
Chloride: 106 mmol/L (ref 98–111)
Creatinine, Ser: 2.26 mg/dL — ABNORMAL HIGH (ref 0.61–1.24)
GFR, Estimated: 29 mL/min — ABNORMAL LOW (ref >60)
Glucose, Bld: 70 mg/dL (ref 70–99)
Potassium: 4.7 mmol/L (ref 3.5–5.1)
Sodium: 138 mmol/L (ref 135–145)

## 2020-08-14 LAB — CBC
HCT: 26.6 % — ABNORMAL LOW (ref 39.0–52.0)
Hemoglobin: 8.1 g/dL — ABNORMAL LOW (ref 13.0–17.0)
MCH: 28.5 pg (ref 26.0–34.0)
MCHC: 30.5 g/dL (ref 30.0–36.0)
MCV: 93.7 fL (ref 80.0–100.0)
Platelets: 233 10*3/uL (ref 150–400)
RBC: 2.84 MIL/uL — ABNORMAL LOW (ref 4.22–5.81)
RDW: 14.3 % (ref 11.5–15.5)
WBC: 6.2 10*3/uL (ref 4.0–10.5)
nRBC: 0 % (ref 0.0–0.2)

## 2020-08-14 NOTE — Progress Notes (Signed)
Subjective: Pt reports no changes.  Denies flatus or BM  Objective: Vital signs in last 24 hours: Temp:  [97.7 F (36.5 C)-98.7 F (37.1 C)] 98.1 F (36.7 C) (07/30 0443) Pulse Rate:  [65-82] 70 (07/30 0443) Resp:  [8-22] 18 (07/30 0443) BP: (105-129)/(41-60) 114/50 (07/30 0443) SpO2:  [94 %-100 %] 94 % (07/30 0443) Weight:  [49.3 kg] 49.3 kg (07/29 1739)   Intake/Output from previous day: 07/29 0701 - 07/30 0700 In: -  Out: 750 [Urine:650; Emesis/NG output:100] Intake/Output this shift: No intake/output data recorded.   General appearance: alert and cooperative GI: soft, nontender, distended  Incision: healing well, no significant drainage  Lab Results:  Recent Labs    08/13/20 0129 08/14/20 0731  WBC 9.4 6.2  HGB 10.1* 8.1*  HCT 34.0* 26.6*  PLT 291 233   BMET Recent Labs    08/13/20 0129  NA 136  K 4.6  CL 100  CO2 20*  GLUCOSE 132*  BUN 30*  CREATININE 2.87*  CALCIUM 8.8*   PT/INR No results for input(s): LABPROT, INR in the last 72 hours. ABG No results for input(s): PHART, HCO3 in the last 72 hours.  Invalid input(s): PCO2, PO2  MEDS, Scheduled  acetaminophen  1,000 mg Oral Q6H   Chlorhexidine Gluconate Cloth  6 each Topical Daily   docusate sodium  100 mg Oral BID   enoxaparin (LOVENOX) injection  30 mg Subcutaneous Q24H   methocarbamol  1,000 mg Oral Q8H    Studies/Results: CT ABDOMEN PELVIS WO CONTRAST  Result Date: 08/13/2020 CLINICAL DATA:  80 year old male with syncope while trying to use bathroom. History of proximal transverse colon mass. Postoperative day 7 status post robot assisted right colectomy. EXAM: CT ABDOMEN AND PELVIS WITHOUT CONTRAST TECHNIQUE: Multidetector CT imaging of the abdomen and pelvis was performed following the standard protocol without IV contrast. COMPARISON:  CT Abdomen and Pelvis 06/24/2020 and earlier. FINDINGS: Lower chest: Lower chest wall subcutaneous gas more so on the left. No lung base  pneumothorax. No pericardial or pleural effusion. Lung bases remain clear. Hepatobiliary: Non-contrast liver surrounded by free intraperitoneal air, but otherwise negative. Gallbladder appears within normal limits. Pancreas: Negative. Spleen: Negative. Adrenals/Urinary Tract: Bilateral renal atrophy. There is mild right hydroureter today which appears to be secondary to mass effect from abnormal small bowel detailed below. Bladder mostly decompressed by Foley catheter. Stomach/Bowel: Positive pneumoperitoneum. There is a small to moderate volume of free air in the upper abdomen mostly under the central diaphragm and in the ventral peritoneal cavity but also scattered in the mesentery. Associated ventral abdominal wall subcutaneous gas. Decompressed large bowel from the splenic flexure distally. Interval right hemicolectomy with a mid transverse to small bowel anastomosis on series 3, image 30. The small bowel just proximal to the anastomosis is decompressed. But elsewhere there are numerous fluid-filled dilated small bowel loops throughout the abdomen and pelvis individually measuring 4-4.5 cm diameter. There is a fairly abrupt transition in the right abdomen on coronal image 34 and series 3, image 49, located about 12-14 cm proximal to the anastomosis. There is some mesenteric swirling in this region. And there is asymmetric mesenteric stranding among the small bowel loops in the right abdomen here on series 3, image 42. But only trace if any free fluid. Duodenum is relatively decompressed. Stomach is mildly distended with air and fluid. Vascular/Lymphatic: Extensive Aortoiliac calcified atherosclerosis. Vascular patency is not evaluated in the absence of IV contrast. No lymphadenopathy. Reproductive: Urethral catheter in place. Subcutaneous gas tracking  from the abdominal wall into the left hemiscrotum along the inguinal canal. Other: No pelvic free fluid. Musculoskeletal: Osteopenia. No acute or suspicious  osseous lesion identified. IMPRESSION: 1. Moderate volume of free intraperitoneal air - with ventral abdominal wall subcutaneous gas, is more than expected from 7 days postop right colectomy and suspicious for perforated bowel or anastomosis. However, there is no definite associated free fluid. 2. Superimposed High-grade Small Bowel Obstruction with an abrupt transition point in the right abdomen about 12-14 cm proximal to the small to large bowel anastomosis - where a mildly swirled appearance of the mesentery and regionally increased inflammation of obstructed small bowel loops raises the possibility of an Internal Hernia. 3. Negative lung bases. No pleural effusion. Aortic Atherosclerosis (ICD10-I70.0). Study discussed by telephone with Dr. Linna Hoff FLOYD on 08/13/2020 at 05:03 . Electronically Signed   By: Genevie Ann M.D.   On: 08/13/2020 05:06   DG Abd Portable 1V  Result Date: 08/13/2020 CLINICAL DATA:  80 year old male NG tube advanced. High-grade small bowel obstruction, recent right colectomy. EXAM: PORTABLE ABDOMEN - 1 VIEW COMPARISON:  0821 hours today and earlier. FINDINGS: Enteric tube tip now within the stomach, side hole at the gastric cardia near the GE J. Stable small-bowel obstruction gas pattern. Lung bases remain negative. IMPRESSION: 1. Enteric tube tip advanced into the stomach, but the side hole remains near the GEJ. Advance 5 more cm to ensure side hole placement within the stomach. 2. Stable small-bowel obstruction gas pattern. Electronically Signed   By: Genevie Ann M.D.   On: 08/13/2020 09:50   DG Abd Portable 1V  Result Date: 08/13/2020 CLINICAL DATA:  NG tube placement EXAM: PORTABLE ABDOMEN - 1 VIEW COMPARISON:  CT abdomen and pelvis dated August 13, 2020 FINDINGS: NG tube in tip overlies the stomach with side port above the GE junction. Dilated air-filled loops of bowel, better evaluated on recent CT of the abdomen and pelvis. Partially visualized lungs are clear. IMPRESSION: NG tube in tip  overlies the stomach with side port above the GE junction. Recommend advancing approximately 8.0 cm. 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   Coronary artery calcification 05/25/2020   Adenocarcinoma ([place]) 05/22/2019   Mass of colon    Symptomatic anemia 05/08/2019   Gastroesophageal reflux disease 02/18/2019   Iron deficiency 09/26/2018   Anemia in stage 4 chronic kidney disease ([place]) 05/21/2018   Vitamin D deficiency 05/21/2018   History of CVA (cerebrovascular accident) 01/31/2018   TSH elevation 01/28/2018   Underweight 01/24/2018   CKD (chronic kidney disease) stage 4, GFR 15-29 ml/min (HCC) 12/10/2017   Hypotension 12/10/2017   Paroxysmal atrial fibrillation ([place]) 11/07/2017   Bilateral carotid artery stenosis 09/03/2017   Malignant melanoma of skin of scalp ([place]) 03/26/2017   Hyperlipidemia 02/08/2017   SAH (subarachnoid hemorrhage) ([place]) 11/20/2016   Cerebrovascular accident (CVA) due to occlusion of left carotid artery ([place]) 11/20/2016    Early post op SBO  Plan: Ambulate in hall Cont NG, NPO   LOS: 1 day     .Rosario Adie, [facility], [place]    08/14/2020 8:05 AM

## 2020-08-15 LAB — PREALBUMIN: Prealbumin: 15.3 mg/dL — ABNORMAL LOW (ref 18–38)

## 2020-08-15 NOTE — Evaluation (Signed)
Occupational Therapy Evaluation Patient Details Name: Keith [name] MRN: [id number] DOB: 1940-10-17 Today's Date: 08/15/2020    History of Present Illness 54M s/p robotic R colectomy on 7/22 for malignancy, discharged 7/27. Was eating poorly at home and had a brief, witnessed syncopal episode at home when attempting to have a bowel movement. Now admitted with SBO.   Clinical Impression   [name] is a 80 year old man with above pertinent medical history who presents with generalized weakness, decreased activity tolerance and impaired balance. Overall patient requiring min guard with RW to ambulate and min assist for ADLs. Will follow patient acutely to improve deficits and limit decline with hospitalization but do no expect OT services needed at discharge. Patient has 24/7 assistance and needed DME.    Follow Up Recommendations  No OT follow up    Equipment Recommendations  None recommended by OT    Recommendations for Other Services       Precautions / Restrictions Precautions Precautions: Fall Restrictions Weight Bearing Restrictions: No      Mobility Bed Mobility Overal bed mobility: Needs Assistance Bed Mobility: Supine to Sit     Supine to sit: Supervision;HOB elevated          Transfers Overall transfer level: Needs assistance Equipment used: Rolling walker (2 wheeled) Transfers: Sit to/from [facility] Sit to Stand: Min guard Stand pivot transfers: Min guard       General transfer comment: Min guard with RW to ambulate in room. No overt loss of balance.    Balance Overall balance assessment: Mild deficits observed, not formally tested                                         ADL either performed or assessed with clinical judgement   ADL Overall ADL's : Needs assistance/impaired Eating/Feeding: Independent Eating/Feeding Details (indicate cue type and reason): able to have ice chips and feed self Grooming:  Min guard;Standing   Upper Body Bathing: Set up;Sitting   Lower Body Bathing: Minimal assistance;Sit to/from stand   Upper Body Dressing : Set up;Sitting   Lower Body Dressing: Minimal assistance;Sit to/from stand Lower Body Dressing Details (indicate cue type and reason): to don shorts and socks Toilet Transfer: Min guard;RW;Ambulation   Toileting- Clothing Manipulation and Hygiene: Sit to/from stand;Minimal assistance               Vision Patient Visual Report: No change from baseline       Perception     Praxis      Pertinent Vitals/Pain Pain Assessment: Faces Faces Pain Scale: Hurts a Durney bit Pain Location: abdomen Pain Descriptors / Indicators: Sore Pain Intervention(s): Limited activity within patient's tolerance     Hand Dominance Right   Extremity/Trunk Assessment Upper Extremity Assessment Upper Extremity Assessment: Overall WFL for tasks assessed   Lower Extremity Assessment Lower Extremity Assessment: Defer to PT evaluation   Cervical / Trunk Assessment Cervical / Trunk Assessment: Kyphotic   Communication Communication Communication: No difficulties   Cognition Arousal/Alertness: Awake/alert Behavior During Therapy: WFL for tasks assessed/performed Overall Cognitive Status: Within Functional Limits for tasks assessed                                     General Comments       Exercises  Shoulder Instructions      Home Living Family/patient expects to be discharged to:: Private residence Living Arrangements: Children (grandchild) Available Help at Discharge: Family Type of Home: House Home Access: Stairs to enter Technical brewer of Steps: 1+1 Entrance Stairs-Rails: None Home Layout: One level     Bathroom Shower/Tub: Occupational psychologist: Handicapped height Bathroom Accessibility: Yes   Home Equipment: Shower seat;Bedside commode   Additional Comments: pt's grandaughter and his son and  son's fiance will help him while he recovers. recently discharged with RW and BSC.      Prior Functioning/Environment Level of Independence: Needs assistance  Gait / Transfers Assistance Needed: recently prescribed walker ADL's / Homemaking Assistance Needed: min guard to min assist due to recent hospitlization and weakness            OT Problem List: Decreased strength;Decreased activity tolerance;Impaired balance (sitting and/or standing);Decreased knowledge of use of DME or AE      OT Treatment/Interventions: Self-care/ADL training;Therapeutic exercise;Patient/family education;Balance training;Therapeutic activities;DME and/or AE instruction    OT Goals(Current goals can be found in the care plan section) Acute Rehab OT Goals Patient Stated Goal: to eat and go home OT Goal Formulation: With patient Time For Goal Achievement: 08/29/20 Potential to Achieve Goals: Good  OT Frequency: Min 2X/week   Barriers to D/C:            Co-evaluation              AM-PAC OT "6 Clicks" Daily Activity     Outcome Measure Help from another person eating meals?: A [place]oria Help from another person taking care of personal grooming?: A Brahm Help from another person toileting, which includes using toliet, bedpan, or urinal?: A [place]y Help from another person bathing (including washing, rinsing, drying)?: A Lenhoff Help from another person to put on and taking off regular upper body clothing?: A Penson Help from another person to put on and taking off regular lower body clothing?: A Kipnis 6 Click Score: 18   End of Session Equipment Utilized During Treatment: Rolling walker Nurse Communication: Mobility status  Activity Tolerance: Patient tolerated treatment well Patient left: in chair;with call bell/phone within reach  OT Visit Diagnosis: Muscle weakness (generalized) (M62.81)                Time: 7106-2694 OT Time Calculation (min): 16 min Charges:  OT General Charges $OT  Visit: 1 Visit OT Evaluation $OT Eval Low Complexity: 1 Low  Orvin Netter, OTR/L [place]  Office [phone] Pager: [phone]   Lenward Chancellor 08/15/2020, 8:49 AM

## 2020-08-15 NOTE — Progress Notes (Signed)
Attending A.Marcello Moores, MD notified via amnion page at 1245 that RN was noticing intermittent bright blood being suctioned via NG.Patient is asymptomatic at this time.

## 2020-08-15 NOTE — Progress Notes (Signed)
Subjective: Has not been out of bed. Denies flatus or BM  Objective: Vital signs in last 24 hours: Temp:  [98 F (36.7 C)-98.5 F (36.9 C)] 98 F (36.7 C) (07/31 0600) Pulse Rate:  [65-70] 65 (07/31 0600) Resp:  [16] 16 (07/31 0600) BP: (117-130)/(50-59) 130/59 (07/31 0600) SpO2:  [96 %-97 %] 97 % (07/31 0600)   Intake/Output from previous day: 07/30 0701 - 07/31 0700 In: 3952.4 [I.V.:3952.4] Out: 1175 [Urine:775; Emesis/NG output:400] Intake/Output this shift: No intake/output data recorded.   General appearance: alert and cooperative GI: soft, nontender, distended NG: bilious fluid Incision: healing well, no significant drainage  Lab Results:  Recent Labs    08/13/20 0129 08/14/20 0731  WBC 9.4 6.2  HGB 10.1* 8.1*  HCT 34.0* 26.6*  PLT 291 233    BMET Recent Labs    08/13/20 0129 08/14/20 0731  NA 136 138  K 4.6 4.7  CL 100 106  CO2 20* 20*  GLUCOSE 132* 70  BUN 30* 31*  CREATININE 2.87* 2.26*  CALCIUM 8.8* 8.3*    PT/INR No results for input(s): LABPROT, INR in the last 72 hours. ABG No results for input(s): PHART, HCO3 in the last 72 hours.  Invalid input(s): PCO2, PO2  MEDS, Scheduled  acetaminophen  1,000 mg Oral Q6H   Chlorhexidine Gluconate Cloth  6 each Topical Daily   docusate sodium  100 mg Oral BID   enoxaparin (LOVENOX) injection  30 mg Subcutaneous Q24H   methocarbamol  1,000 mg Oral Q8H    Studies/Results: DG Abd Portable 1V  Result Date: 08/14/2020 CLINICAL DATA:  Small-bowel obstruction EXAM: PORTABLE ABDOMEN - 1 VIEW COMPARISON:  08/13/2020 FINDINGS: Enteric tube terminates in the region of the gastric body. Diffuse small bowel dilatation has slightly improved since prior exam. IMPRESSION: Diffuse small bowel dilatation, minimally improved since prior exam. Electronically Signed   By: Miachel Roux M.D.   On: 08/14/2020 11:50   DG Abd Portable 1V  Result Date: 08/13/2020 CLINICAL DATA:  80 year old male NG tube advanced.  High-grade small bowel obstruction, recent right colectomy. EXAM: PORTABLE ABDOMEN - 1 VIEW COMPARISON:  0821 hours today and earlier. FINDINGS: Enteric tube tip now within the stomach, side hole at the gastric cardia near the GE J. Stable small-bowel obstruction gas pattern. Lung bases remain negative. IMPRESSION: 1. Enteric tube tip advanced into the stomach, but the side hole remains near the GEJ. Advance 5 more cm to ensure side hole placement within the stomach. 2. Stable small-bowel obstruction gas pattern. Electronically Signed   By: Genevie Ann M.D.   On: 08/13/2020 09:50   DG Abd Portable 1V  Result Date: 08/13/2020 CLINICAL DATA:  NG tube placement EXAM: PORTABLE ABDOMEN - 1 VIEW COMPARISON:  CT abdomen and pelvis dated August 13, 2020 FINDINGS: NG tube in tip overlies the stomach with side port above the GE junction. Dilated air-filled loops of bowel, better evaluated on recent CT of the abdomen and pelvis. Partially visualized lungs are clear. IMPRESSION: NG tube in tip overlies the stomach with side port above the GE junction. Recommend advancing approximately 8.0 cm. Electronically Signed   By: Yetta Glassman MD   On: 08/13/2020 08:37    Assessment: s/p  Patient Active Problem List   Diagnosis Date Noted   Internal hernia 08/13/2020   Encounter for assessment for small bowel obstruciton 08/13/2020   Protein-calorie malnutrition, severe 08/11/2020   Cancer of transverse colon ([place]) 08/06/2020   Coronary artery calcification 05/25/2020   Adenocarcinoma (  [place]) 05/22/2019   Mass of colon    Symptomatic anemia 05/08/2019   Gastroesophageal reflux disease 02/18/2019   Iron deficiency 09/26/2018   Anemia in stage 4 chronic kidney disease ([place]) 05/21/2018   Vitamin D deficiency 05/21/2018   History of CVA (cerebrovascular accident) 01/31/2018   TSH elevation 01/28/2018   Underweight 01/24/2018   CKD (chronic kidney disease) stage 4, GFR 15-29 ml/min (HCC) 12/10/2017   Hypotension  12/10/2017   Paroxysmal atrial fibrillation ([place]) 11/07/2017   Bilateral carotid artery stenosis 09/03/2017   Malignant melanoma of skin of scalp ([place]) 03/26/2017   Hyperlipidemia 02/08/2017   SAH (subarachnoid hemorrhage) ([employer]) 11/20/2016   Cerebrovascular accident (CVA) due to occlusion of left carotid artery ([place]) 11/20/2016    Early post op SBO  Plan: Pt needs to ambulate.  Will transfer to gen surgery floor so this can happen more consistently  Cont NPO and NG.  May need TPN started if he doesn't open up soon.  Recheck labs in AM  LOS: 2 days     .Rosario Adie, MD [facility] Surgery, [place]    08/15/2020 7:38 AM

## 2020-08-15 NOTE — Progress Notes (Addendum)
Patient ambulated in hall about 360 ft. Patient sat up in chair for an befor requesting to go back to bed. Patient reported passing gas about 5 times on shift.

## 2020-08-15 NOTE — Progress Notes (Signed)
PT Cancellation Note  Patient Details Name: Keith [name] MRN: [id number] DOB: Jun 18, 1940   Cancelled Treatment:     PT order received and eval attempted x 2.  Pt declined in am 2* fatigue but stated he had walked with nursing.  In pm, pt sleeping soundly.  RN aware and will follow in am.   Tyrick Dunagan 08/15/2020, 3:13 PM

## 2020-08-16 LAB — COMPREHENSIVE METABOLIC PANEL WITH GFR
ALT: 9 U/L (ref 0–44)
AST: 14 U/L — ABNORMAL LOW (ref 15–41)
Albumin: 2.3 g/dL — ABNORMAL LOW (ref 3.5–5.0)
Alkaline Phosphatase: 42 U/L (ref 38–126)
Anion gap: 12 (ref 5–15)
BUN: 28 mg/dL — ABNORMAL HIGH (ref 8–23)
CO2: 29 mmol/L (ref 22–32)
Calcium: 8.5 mg/dL — ABNORMAL LOW (ref 8.9–10.3)
Chloride: 101 mmol/L (ref 98–111)
Creatinine, Ser: 1.98 mg/dL — ABNORMAL HIGH (ref 0.61–1.24)
GFR, Estimated: 34 mL/min — ABNORMAL LOW
Glucose, Bld: 84 mg/dL (ref 70–99)
Potassium: 4.2 mmol/L (ref 3.5–5.1)
Sodium: 142 mmol/L (ref 135–145)
Total Bilirubin: 1.2 mg/dL (ref 0.3–1.2)
Total Protein: 5.2 g/dL — ABNORMAL LOW (ref 6.5–8.1)

## 2020-08-16 LAB — CBC
HCT: 27.5 % — ABNORMAL LOW (ref 39.0–52.0)
Hemoglobin: 8.5 g/dL — ABNORMAL LOW (ref 13.0–17.0)
MCH: 28 pg (ref 26.0–34.0)
MCHC: 30.9 g/dL (ref 30.0–36.0)
MCV: 90.5 fL (ref 80.0–100.0)
Platelets: 278 10*3/uL (ref 150–400)
RBC: 3.04 MIL/uL — ABNORMAL LOW (ref 4.22–5.81)
RDW: 14.3 % (ref 11.5–15.5)
WBC: 6 10*3/uL (ref 4.0–10.5)
nRBC: 0 % (ref 0.0–0.2)

## 2020-08-16 MED ORDER — BOOST / RESOURCE BREEZE PO LIQD CUSTOM
1.0000 | Freq: Three times a day (TID) | ORAL | Status: DC
Start: 2020-08-16 — End: 2020-08-19
  Administered 2020-08-16: 1 via ORAL

## 2020-08-16 NOTE — Progress Notes (Addendum)
Patient has ambulated with walker twice so far this shift. Once down the hallway. Per previous RN, patient prefers to stay on [place] until he is discharged. AC was made aware per previous RN. Keith [name]

## 2020-08-16 NOTE — Progress Notes (Signed)
  Subjective: Ambulated in the hall.  Had a BM and passing flatus  Objective: Vital signs in last 24 hours: Temp:  [97.6 F (36.4 C)-98.8 F (37.1 C)] 98.8 F (37.1 C) (08/01 0621) Pulse Rate:  [60-81] 63 (08/01 0621) Resp:  [15-16] 16 (08/01 0621) BP: (132-153)/(42-65) 150/50 (08/01 0621) SpO2:  [96 %-98 %] 96 % (08/01 0621)   Intake/Output from previous day: 07/31 0701 - 08/01 0700 In: -  Out: 2001 [Urine:400; Emesis/NG output:1600; Stool:1] Intake/Output this shift: No intake/output data recorded.   General appearance: alert and cooperative GI: soft, nontender, non-distended NG: clear fluid Incision: healing well, no significant drainage  Lab Results:  Recent Labs    08/14/20 0731 08/16/20 0507  WBC 6.2 6.0  HGB 8.1* 8.5*  HCT 26.6* 27.5*  PLT 233 278    BMET Recent Labs    08/14/20 0731 08/16/20 0507  NA 138 142  K 4.7 4.2  CL 106 101  CO2 20* 29  GLUCOSE 70 84  BUN 31* 28*  CREATININE 2.26* 1.98*  CALCIUM 8.3* 8.5*    PT/INR No results for input(s): LABPROT, INR in the last 72 hours. ABG No results for input(s): PHART, HCO3 in the last 72 hours.  Invalid input(s): PCO2, PO2  MEDS, Scheduled  acetaminophen  1,000 mg Oral Q6H   Chlorhexidine Gluconate Cloth  6 each Topical Daily   enoxaparin (LOVENOX) injection  30 mg Subcutaneous Q24H   feeding supplement  1 Container Oral TID BM   methocarbamol  1,000 mg Oral Q8H    Studies/Results: DG Abd Portable 1V  Result Date: 08/14/2020 CLINICAL DATA:  Small-bowel obstruction EXAM: PORTABLE ABDOMEN - 1 VIEW COMPARISON:  08/13/2020 FINDINGS: Enteric tube terminates in the region of the gastric body. Diffuse small bowel dilatation has slightly improved since prior exam. IMPRESSION: Diffuse small bowel dilatation, minimally improved since prior exam. Electronically Signed   By: Miachel Roux M.D.   On: 08/14/2020 11:50    Assessment: s/p  Patient Active Problem List   Diagnosis Date Noted    Internal hernia 08/13/2020   Encounter for assessment for small bowel obstruciton 08/13/2020   Protein-calorie malnutrition, severe 08/11/2020   Cancer of transverse colon ([place]) 08/06/2020   Coronary artery calcification 05/25/2020   Adenocarcinoma ([place]) 05/22/2019   Mass of colon    Symptomatic anemia 05/08/2019   Gastroesophageal reflux disease 02/18/2019   Iron deficiency 09/26/2018   Anemia in stage 4 chronic kidney disease ([place]) 05/21/2018   Vitamin D deficiency 05/21/2018   History of CVA (cerebrovascular accident) 01/31/2018   TSH elevation 01/28/2018   Underweight 01/24/2018   CKD (chronic kidney disease) stage 4, GFR 15-29 ml/min (HCC) 12/10/2017   Hypotension 12/10/2017   Paroxysmal atrial fibrillation ([place]) 11/07/2017   Bilateral carotid artery stenosis 09/03/2017   Malignant melanoma of skin of scalp ([place]) 03/26/2017   Hyperlipidemia 02/08/2017   SAH (subarachnoid hemorrhage) ([place]) 11/20/2016   Cerebrovascular accident (CVA) due to occlusion of left carotid artery ([place]) 11/20/2016    Early post op SBO  Plan:  D/c NG   Advance diet as tolerated Ambulate in hall Decrease IVF's  Recheck labs in AM  LOS: 3 days     .Rosario Adie, MD [facility] Surgery, [place]    08/16/2020 7:30 AM

## 2020-08-16 NOTE — Progress Notes (Signed)
Patient ambulated 2x on my shift with walker. Patient verbalizes he will walk more in the morning.   Patient encouraged to walk 6x daily per order.

## 2020-08-16 NOTE — Progress Notes (Signed)
Per md continue foley catheter.

## 2020-08-16 NOTE — Evaluation (Signed)
Physical Therapy Evaluation Patient Details Name: Keith [name] MRN: [id number] DOB: [birth year] Today's Date: 08/16/2020   History of Present Illness  74M s/p robotic R colectomy on 7/22 for malignancy, discharged 7/27. Was eating poorly at home and had a brief, witnessed syncopal episode at home when attempting to have a bowel movement. Now admitted with SBO.  Clinical Impression  Pt is mobilizing well, he ambulated 150' with RW, no loss of balance. No physical assist needed for bed mobility, transfers, & ambulation. He is ready to DC home from PT standpoint. He has good support available from family at home and has needed DME. PT will sign off as he is modified independent with mobility. I recommend he continue to ambulate with nursing daily. I've referred him to our mobility specialist for ambulation as well.     Follow Up Recommendations No PT follow up    Equipment Recommendations  None recommended by PT    Recommendations for Other Services       Precautions / Restrictions Precautions Precautions: Fall Precaution Comments: denies h/o falls in past 1 year, admitted with syncopal event Restrictions Weight Bearing Restrictions: No      Mobility  Bed Mobility Overal bed mobility: Modified Independent Bed Mobility: Supine to Sit     Supine to sit: Modified independent (Device/Increase time)     General bed mobility comments: used bedrail    Transfers Overall transfer level: Modified independent Equipment used: Rolling walker (2 wheeled) Transfers: Sit to/from Stand Sit to Stand: Supervision;Modified independent (Device/Increase time)         General transfer comment: VCs for hand placement, no assist needed  Ambulation/Gait Ambulation/Gait assistance: Modified independent (Device/Increase time) Gait Distance (Feet): 150 Feet Assistive device: Rolling walker (2 wheeled) Gait Pattern/deviations: Step-through pattern;Decreased stride length;Trunk flexed Gait  velocity: WFL   General Gait Details: steady with RW, no loss of balance  Stairs            Wheelchair Mobility    Modified Rankin (Stroke Patients Only)       Balance Overall balance assessment: Modified Independent                                           Pertinent Vitals/Pain Pain Assessment: No/denies pain    Home Living Family/patient expects to be discharged to:: Private residence Living Arrangements: Children (grandchild) Available Help at Discharge: Family Type of Home: House Home Access: Stairs to enter Entrance Stairs-Rails: None Technical brewer of Steps: 1+1 Home Layout: One level Home Equipment: Shower seat;Bedside commode;Walker - 2 wheels Additional Comments: pt's grandaughter and his son and son's fiance will help him while he recovers. recently discharged with RW and BSC.    Prior Function Level of Independence: Needs assistance   Gait / Transfers Assistance Needed: recently prescribed walker  ADL's / Homemaking Assistance Needed: min guard to min assist due to recent hospitlization and weakness        Hand Dominance   Dominant Hand: Right    Extremity/Trunk Assessment   Upper Extremity Assessment Upper Extremity Assessment: Defer to OT evaluation    Lower Extremity Assessment Lower Extremity Assessment: Overall WFL for tasks assessed    Cervical / Trunk Assessment Cervical / Trunk Assessment: Kyphotic  Communication   Communication: No difficulties  Cognition Arousal/Alertness: Awake/alert Behavior During Therapy: WFL for tasks assessed/performed Overall Cognitive Status: Within Functional Limits for tasks assessed  General Comments: pt very pleasant and eager to mobilize      General Comments      Exercises     Assessment/Plan    PT Assessment Patent does not need any further PT services  PT Problem List         PT Treatment Interventions       PT Goals (Current goals can be found in the Care Plan section)  Acute Rehab PT Goals Patient Stated Goal: to eat and go home PT Goal Formulation: All assessment and education complete, DC therapy    Frequency     Barriers to discharge        Co-evaluation               AM-PAC PT "6 Clicks" Mobility  Outcome Measure Help needed turning from your back to your side while in a flat bed without using bedrails?: None Help needed moving from lying on your back to sitting on the side of a flat bed without using bedrails?: None Help needed moving to and from a bed to a chair (including a wheelchair)?: None Help needed standing up from a chair using your arms (e.g., wheelchair or bedside chair)?: None Help needed to walk in hospital room?: None Help needed climbing 3-5 steps with a railing? : None 6 Click Score: 24    End of Session Equipment Utilized During Treatment: Gait belt Activity Tolerance: Patient tolerated treatment well Patient left: in bed;with call bell/phone within reach Nurse Communication: Mobility status      Time: 2878-6767 PT Time Calculation (min) (ACUTE ONLY): 21 min   Charges:   PT Evaluation $PT Eval Low Complexity: 1 Low         Philomena Doheny PT 08/16/2020  Acute Rehabilitation Services Pager [phone] Office [phone]

## 2020-08-17 MED ORDER — ENSURE ENLIVE PO LIQD
237.0000 mL | Freq: Two times a day (BID) | ORAL | Status: DC
  Administered 2020-08-17 – 2020-08-18 (×2): 237 mL via ORAL

## 2020-08-17 MED ORDER — ARTIFICIAL TEARS OPHTHALMIC OINT
TOPICAL_OINTMENT | OPHTHALMIC | Status: DC | PRN
  Filled 2020-08-17: qty 3.5

## 2020-08-17 NOTE — Progress Notes (Signed)
  Subjective: Ambulated in the hall.  Had a BM and passing flatus  Objective: Vital signs in last 24 hours: Temp:  [97.7 F (36.5 C)-98.7 F (37.1 C)] 98.7 F (37.1 C) (08/02 0516) Pulse Rate:  [59-87] 59 (08/02 0516) Resp:  [14-17] 14 (08/02 0516) BP: (113-139)/(56-60) 128/57 (08/02 0516) SpO2:  [96 %-100 %] 96 % (08/02 0516)   Intake/Output from previous day: 08/01 0701 - 08/02 0700 In: 770 [P.O.:420; I.V.:350] Out: 600 [Urine:600] Intake/Output this shift: No intake/output data recorded.   General appearance: alert and cooperative GI: soft, nontender, non-distended Incision: healing well, no significant drainage  Lab Results:  Recent Labs    08/16/20 0507  WBC 6.0  HGB 8.5*  HCT 27.5*  PLT 278    BMET Recent Labs    08/16/20 0507  NA 142  K 4.2  CL 101  CO2 29  GLUCOSE 84  BUN 28*  CREATININE 1.98*  CALCIUM 8.5*    PT/INR No results for input(s): LABPROT, INR in the last 72 hours. ABG No results for input(s): PHART, HCO3 in the last 72 hours.  Invalid input(s): PCO2, PO2  MEDS, Scheduled  acetaminophen  1,000 mg Oral Q6H   Chlorhexidine Gluconate Cloth  6 each Topical Daily   enoxaparin (LOVENOX) injection  30 mg Subcutaneous Q24H   feeding supplement  1 Container Oral TID BM   feeding supplement  237 mL Oral BID BM   methocarbamol  1,000 mg Oral Q8H    Studies/Results: No results found.  Assessment: s/p  Patient Active Problem List   Diagnosis Date Noted   Internal hernia 08/13/2020   Encounter for assessment for small bowel obstruciton 08/13/2020   Protein-calorie malnutrition, severe 08/11/2020   Cancer of transverse colon ([place]) 08/06/2020   Coronary artery calcification 05/25/2020   Adenocarcinoma ([place]) 05/22/2019   Mass of colon    Symptomatic anemia 05/08/2019   Gastroesophageal reflux disease 02/18/2019   Iron deficiency 09/26/2018   Anemia in stage 4 chronic kidney disease ([place]) 05/21/2018   Vitamin D deficiency  05/21/2018   History of CVA (cerebrovascular accident) 01/31/2018   TSH elevation 01/28/2018   Underweight 01/24/2018   CKD (chronic kidney disease) stage 4, GFR 15-29 ml/min (HCC) 12/10/2017   Hypotension 12/10/2017   Paroxysmal atrial fibrillation ([place]) 11/07/2017   Bilateral carotid artery stenosis 09/03/2017   Malignant melanoma of skin of scalp ([place]) 03/26/2017   Hyperlipidemia 02/08/2017   SAH (subarachnoid hemorrhage) ([place]) 11/20/2016   Cerebrovascular accident (CVA) due to occlusion of left carotid artery ([place]) 11/20/2016    Early post op SBO  Plan:  D/c NG   Advance diet  Ambulate in hall Cont IVF's   LOS: 4 days     .Keith Adie, MD [facility] Surgery, [place]    08/17/2020 9:04 AM

## 2020-08-17 NOTE — Progress Notes (Signed)
Mobility Specialist - Progress Note    08/17/20 1644  Mobility  Activity Ambulated in hall  Level of Assistance Standby assist, set-up cues, supervision of patient - no hands on  Assistive Device Front wheel walker  Distance Ambulated (ft) 200 ft  Mobility Ambulated with assistance in hallway  Mobility Response Tolerated well  Mobility performed by Mobility specialist  $Mobility charge 1 Mobility   Pt ambulated 200 ft in hallway while pushing RW. Pt did not c/o of pain, dizziness, or SOB and tolerated session well. Pt returned to bed after session to rest and was left with call bell at side.   [place] Specialist Acute Rehabilitation Services Phone: [phone] 08/17/20, 4:46 PM

## 2020-08-18 LAB — CULTURE, BLOOD (ROUTINE X 2)
Culture: NO GROWTH
Culture: NO GROWTH
Special Requests: ADEQUATE
Special Requests: ADEQUATE

## 2020-08-18 MED ORDER — ALUM & MAG HYDROXIDE-SIMETH 200-200-20 MG/5ML PO SUSP: 30.0000 mL | ORAL | Status: DC | PRN

## 2020-08-18 MED ORDER — CALCIUM CARBONATE ANTACID 500 MG PO CHEW
1.0000 | CHEWABLE_TABLET | Freq: Four times a day (QID) | ORAL | Status: DC | PRN
  Administered 2020-08-18: 200 mg via ORAL
  Filled 2020-08-18: qty 1

## 2020-08-18 NOTE — Progress Notes (Signed)
Mobility Specialist - Progress Note    08/18/20 1450  Mobility  Activity Ambulated in hall  Level of Assistance Standby assist, set-up cues, supervision of patient - no hands on  Assistive Device Front wheel walker  Distance Ambulated (ft) 250 ft  Mobility Ambulated with assistance in hallway  Mobility Response Tolerated well  Mobility performed by Mobility specialist  $Mobility charge 1 Mobility   Pt ambulated ~250 ft in hallway using RW and did not report any pain, dizziness, or SOB. Pt tolerated session well and returned to bed after session. Pt was left in bed with call bell at side.   [place] Specialist Acute Rehabilitation Services Phone: [phone] 08/18/20, 2:51 PM

## 2020-08-18 NOTE — Progress Notes (Signed)
  Subjective: Having some issues with bloating and abd pain, tolerating ~40-50% of his diet and drinking some nutritional shakes, passing flatus and had a BM this AM  Objective: Vital signs in last 24 hours: Temp:  [97.8 F (36.6 C)-98.1 F (36.7 C)] 98.1 F (36.7 C) (08/03 0503) Pulse Rate:  [56-60] 59 (08/03 0503) Resp:  [17-18] 18 (08/03 0503) BP: (106-123)/(51-67) 123/56 (08/03 0503) SpO2:  [96 %-99 %] 96 % (08/03 0503)   Intake/Output from previous day: 08/02 0701 - 08/03 0700 In: -  Out: 800 [Urine:800] Intake/Output this shift: Total I/O In: 978 [P.O.:978] Out: -    General appearance: alert and cooperative GI: soft, nontender, non-distended Incision: healing well, no significant drainage  Lab Results:  Recent Labs    08/16/20 0507  WBC 6.0  HGB 8.5*  HCT 27.5*  PLT 278    BMET Recent Labs    08/16/20 0507  NA 142  K 4.2  CL 101  CO2 29  GLUCOSE 84  BUN 28*  CREATININE 1.98*  CALCIUM 8.5*    PT/INR No results for input(s): LABPROT, INR in the last 72 hours. ABG No results for input(s): PHART, HCO3 in the last 72 hours.  Invalid input(s): PCO2, PO2  MEDS, Scheduled  acetaminophen  1,000 mg Oral Q6H   Chlorhexidine Gluconate Cloth  6 each Topical Daily   enoxaparin (LOVENOX) injection  30 mg Subcutaneous Q24H   feeding supplement  1 Container Oral TID BM   feeding supplement  237 mL Oral BID BM   methocarbamol  1,000 mg Oral Q8H    Studies/Results: No results found.  Assessment: s/p  Patient Active Problem List   Diagnosis Date Noted   Internal hernia 08/13/2020   Encounter for assessment for small bowel obstruciton 08/13/2020   Protein-calorie malnutrition, severe 08/11/2020   Cancer of transverse colon ([place]) 08/06/2020   Coronary artery calcification 05/25/2020   Adenocarcinoma ([place]) 05/22/2019   Mass of colon    Symptomatic anemia 05/08/2019   Gastroesophageal reflux disease 02/18/2019   Iron deficiency 09/26/2018    Anemia in stage 4 chronic kidney disease ([place]) 05/21/2018   Vitamin D deficiency 05/21/2018   History of CVA (cerebrovascular accident) 01/31/2018   TSH elevation 01/28/2018   Underweight 01/24/2018   CKD (chronic kidney disease) stage 4, GFR 15-29 ml/min ([facility]) 12/10/2017   Hypotension 12/10/2017   Paroxysmal atrial fibrillation ([place]) 11/07/2017   Bilateral carotid artery stenosis 09/03/2017   Malignant melanoma of skin of scalp ([place]) 03/26/2017   Hyperlipidemia 02/08/2017   SAH (subarachnoid hemorrhage) ([place]) 11/20/2016   Cerebrovascular accident (CVA) due to occlusion of left carotid artery ([place]) 11/20/2016    Early post op SBO, resolving slowly  Plan:  Cont diet as tolerated Ambulate in hall Cont IVF's Will try to d/c foley again today   LOS: 5 days     .Rosario Adie, MD [facility], [place]    08/18/2020 11:58 AM

## 2020-08-18 NOTE — Progress Notes (Signed)
Occupational Therapy Treatment Patient Details Name: Keith [name] MRN: [id number] DOB: [birth year] Today's Date: 08/18/2020    History of present illness 61M s/p robotic R colectomy on 7/22 for malignancy, discharged 7/27. Was eating poorly at home and had a brief, witnessed syncopal episode at home when attempting to have a bowel movement. Now admitted with SBO.   OT comments  Patient exhibiting more activity tolerance and improved mobility today compared to evaluation. Today patient able to transfer out of bed and ambulate in hall with RW - without physical assistance or concerns for safety from therapist. Patient demonstrated ability to don socks without difficulty today. Patient reports he was able to perform toileting without physical assistance.  Will continue to follow until all goals are met.   Follow Up Recommendations  No OT follow up    Equipment Recommendations  None recommended by OT    Recommendations for Other Services      Precautions / Restrictions Precautions Precautions: Fall Precaution Comments: denies h/o falls in past 1 year, admitted with syncopal event       Mobility Bed Mobility Overal bed mobility: Modified Independent             General bed mobility comments: transferred to side of bed    Transfers Overall transfer level: Needs assistance Equipment used: Rolling walker (2 wheeled) Transfers: Sit to/from Stand           General transfer comment: Patient requested to ambulate in hall and did so with walker at a brisk pace and without any loss of balance or unsteadiness.    Balance Overall balance assessment: Modified Independent                                         ADL either performed or assessed with clinical judgement   ADL Overall ADL's : Needs assistance/impaired                     Lower Body Dressing: Supervision/safety Lower Body Dressing Details (indicate cue type and reason): able to don socks  today without assistance                     Vision Patient Visual Report: No change from baseline     Perception     Praxis      Cognition Arousal/Alertness: Awake/alert Behavior During Therapy: WFL for tasks assessed/performed Overall Cognitive Status: Within Functional Limits for tasks assessed                                          Exercises     Shoulder Instructions       General Comments      Pertinent Vitals/ Pain       Pain Assessment: Faces Faces Pain Scale: Hurts a Caetano bit Pain Location: abdomen Pain Descriptors / Indicators: Sore Pain Intervention(s): Monitored during session  Home Living                                          Prior Functioning/Environment              Frequency  Progress Toward Goals  OT Goals(current goals can now be found in the care plan section)  Progress towards OT goals: Progressing toward goals  Acute Rehab OT Goals Patient Stated Goal: to eat and go home OT Goal Formulation: With patient Time For Goal Achievement: 08/29/20 Potential to Achieve Goals: Good  Plan Discharge plan remains appropriate    Co-evaluation                 AM-PAC OT "6 Clicks" Daily Activity     Outcome Measure   Help from another person eating meals?: None Help from another person taking care of personal grooming?: None Help from another person toileting, which includes using toliet, bedpan, or urinal?: A [place] Help from another person bathing (including washing, rinsing, drying)?: A [place]o Help from another person to put on and taking off regular upper body clothing?: A [place] Help from another person to put on and taking off regular lower body clothing?: A Gajda 6 Click Score: 20    End of Session Equipment Utilized During Treatment: Rolling walker  OT Visit Diagnosis: Muscle weakness (generalized) (M62.81)   Activity Tolerance Patient tolerated treatment  well   Patient Left in chair;with call bell/phone within reach   Nurse Communication Mobility status        Time: 7915-0413 OT Time Calculation (min): 9 min  Charges: OT General Charges $OT Visit: 1 Visit OT Treatments $Therapeutic Activity: 8-22 mins  Derl Barrow, OTR/L [place]  Office [phone] Pager: [place] 08/18/2020, 9:48 AM

## 2020-08-19 ENCOUNTER — Encounter: Payer: Self-pay | Admitting: Family Medicine

## 2020-08-19 NOTE — Discharge Summary (Signed)
Physician Discharge Summary  Patient ID: Keith [name] MRN: [id number] DOB/AGE: 09-11-1940 80 y.o.  Admit date: 08/13/2020 Discharge date: 08/19/2020  Admission Diagnoses:  Discharge Diagnoses:  Principal Problem:   Encounter for assessment for small bowel obstruciton   Discharged Condition: good  Hospital Course: Pt admitted for early post op bowel obstruction.  This resolved with NGT.  Foley was attempted to be removed again after surgery but patient unable to void.  Patient will go home with foley and f/u with urologist.    Consults: None  Significant Diagnostic Studies: labs: cbc, bmet  Treatments: IV hydration and analgesia: acetaminophen  Discharge Exam: Blood pressure 118/71, pulse (!) 58, temperature 98 F (36.7 C), temperature source Oral, resp. rate 14, height 5\' 9"  (1.753 m), weight 49.3 kg, SpO2 96 %. General appearance: alert and cooperative GI: soft, nondistended Incision/Wound: clean, dry, healing appropriately  Disposition: Discharge disposition: 01-Home or Self Care          Follow-up Information     Leighton Ruff, MD. Schedule an appointment as soon as possible for a visit in 2 week(s).   Specialties: General Surgery, Colon and Rectal Surgery Contact information: [place] [address] [phone]         Tracie Harrier, [place] information: [address] [place] [address] [phone]                 Signed: Rosario Adie 08/19/6960, 8:34 AM

## 2020-08-19 NOTE — Discharge Instructions (Signed)
SURGERY: POST OP INSTRUCTIONS (Surgery for small bowel obstruction, colon resection, etc)   ######################################################################  EAT Gradually transition to a high fiber diet with a fiber supplement over the next few days after discharge  WALK Walk an hour a day.  Control your pain to do that.    CONTROL PAIN Control pain so that you can walk, sleep, tolerate sneezing/coughing, go up/down stairs.  HAVE A BOWEL MOVEMENT DAILY Keep your bowels regular to avoid problems.  OK to try a laxative to override constipation.  OK to use an antidairrheal to slow down diarrhea.  Call if not better after 2 tries  CALL IF YOU HAVE PROBLEMS/CONCERNS Call if you are still struggling despite following these instructions. Call if you have concerns not answered by these instructions  ######################################################################   DIET Follow a light diet the first few days at home.  Start with a bland diet such as soups, liquids, starchy foods, low fat foods, etc.  If you feel full, bloated, or constipated, stay on a ful liquid or pureed/blenderized diet for a few days until you feel better and no longer constipated. Be sure to drink plenty of fluids every day to avoid getting dehydrated (feeling dizzy, not urinating, etc.). Gradually add a fiber supplement to your diet over the next week.  Gradually get back to a regular solid diet.  Avoid fast food or heavy meals the first week as you are more likely to get nauseated. It is expected for your digestive tract to need a few months to get back to normal.  It is common for your bowel movements and stools to be irregular.  You will have occasional bloating and cramping that should eventually fade away.  Until you are eating solid food normally, off all pain medications, and back to regular activities; your bowels will not be normal. Focus on eating a low-fat, high fiber diet the rest of your life  (See Getting to Good Bowel Health, below).  CARE of your INCISION or WOUND  It is good for closed incisions and even open wounds to be washed every day.  Shower every day.  Short baths are fine.  Wash the incisions and wounds clean with soap & water.    You may leave closed incisions open to air if it is dry.   You may cover the incision with clean gauze & replace it after your daily shower for comfort.  STAPLES: You have skin staples.  Leave them in place & set up an appointment for them to be removed by a surgery office nurse ~10 days after surgery. = 1st week of January 2024    ACTIVITIES as tolerated Start light daily activities --- self-care, walking, climbing stairs-- beginning the day after surgery.  Gradually increase activities as tolerated.  Control your pain to be active.  Stop when you are tired.  Ideally, walk several times a day, eventually an hour a day.   Most people are back to most day-to-day activities in a few weeks.  It takes 4-8 weeks to get back to unrestricted, intense activity. If you can walk 30 minutes without difficulty, it is safe to try more intense activity such as jogging, treadmill, bicycling, low-impact aerobics, swimming, etc. Save the most intensive and strenuous activity for last (Usually 4-8 weeks after surgery) such as sit-ups, heavy lifting, contact sports, etc.  Refrain from any intense heavy lifting or straining until you are off narcotics for pain control.  You will have off days, but things should improve   week-by-week. DO NOT PUSH THROUGH PAIN.  Let pain be your guide: If it hurts to do something, don't do it.  Pain is your body warning you to avoid that activity for another week until the pain goes down. You may drive when you are no longer taking narcotic prescription pain medication, you can comfortably wear a seatbelt, and you can safely make sudden turns/stops to protect yourself without hesitating due to pain. You may have sexual intercourse when it  is comfortable. If it hurts to do something, stop.  MEDICATIONS Take your usually prescribed home medications unless otherwise directed.   Blood thinners:  Usually you can restart any strong blood thinners after the second postoperative day.  It is OK to take aspirin right away.     If you are on strong blood thinners (warfarin/Coumadin, Plavix, Xerelto, Eliquis, Pradaxa, etc), discuss with your surgeon, medicine PCP, and/or cardiologist for instructions on when to restart the blood thinner & if blood monitoring is needed (PT/INR blood check, etc).     PAIN CONTROL Pain after surgery or related to activity is often due to strain/injury to muscle, tendon, nerves and/or incisions.  This pain is usually short-term and will improve in a few months.  To help speed the process of healing and to get back to regular activity more quickly, DO THE FOLLOWING THINGS TOGETHER: Increase activity gradually.  DO NOT PUSH THROUGH PAIN Use Ice and/or Heat Try Gentle Massage and/or Stretching Take over the counter pain medication Take Narcotic prescription pain medication for more severe pain  Good pain control = faster recovery.  It is better to take more medicine to be more active than to stay in bed all day to avoid medications.  Increase activity gradually Avoid heavy lifting at first, then increase to lifting as tolerated over the next 6 weeks. Do not "push through" the pain.  Listen to your body and avoid positions and maneuvers than reproduce the pain.  Wait a few days before trying something more intense Walking an hour a day is encouraged to help your body recover faster and more safely.  Start slowly and stop when getting sore.  If you can walk 30 minutes without stopping or pain, you can try more intense activity (running, jogging, aerobics, cycling, swimming, treadmill, sex, sports, weightlifting, etc.) Remember: If it hurts to do it, then don't do it! Use Ice and/or Heat You will have swelling and  bruising around the incisions.  This will take several weeks to resolve. Ice packs or heating pads (6-8 times a day, 30-60 minutes at a time) will help sooth soreness & bruising. Some people prefer to use ice alone, heat alone, or alternate between ice & heat.  Experiment and see what works best for you.  Consider trying ice for the first few days to help decrease swelling and bruising; then, switch to heat to help relax sore spots and speed recovery. Shower every day.  Short baths are fine.  It feels good!  Keep the incisions and wounds clean with soap & water.   Try Gentle Massage and/or Stretching Massage at the area of pain many times a day Stop if you feel pain - do not overdo it Take over the counter pain medication This helps the muscle and nerve tissues become less irritable and calm down faster Choose ONE of the following over-the-counter anti-inflammatory medications: Acetaminophen 500mg tabs (Tylenol) 1-2 pills with every meal and just before bedtime (avoid if you have liver problems or if you have   acetaminophen in you narcotic prescription) Naproxen 220mg tabs (ex. Aleve, Naprosyn) 1-2 pills twice a day (avoid if you have kidney, stomach, IBD, or bleeding problems) Ibuprofen 200mg tabs (ex. Advil, Motrin) 3-4 pills with every meal and just before bedtime (avoid if you have kidney, stomach, IBD, or bleeding problems) Take with food/snack several times a day as directed for at least 2 weeks to help keep pain / soreness down & more manageable. Take Narcotic prescription pain medication for more severe pain A prescription for strong pain control is often given to you upon discharge (for example: oxycodone/Percocet, hydrocodone/Norco/Vicodin, or tramadol/Ultram) Take your pain medication as prescribed. Be mindful that most narcotic prescriptions contain Tylenol (acetaminophen) as well - avoid taking too much Tylenol. If you are having problems/concerns with the prescription medicine (does  not control pain, nausea, vomiting, rash, itching, etc.), please call us [phone] to see if we need to switch you to a different pain medicine that will work better for you and/or control your side effects better. If you need a refill on your pain medication, you must call the office before 4 pm and on weekdays only.  By federal law, prescriptions for narcotics cannot be called into a pharmacy.  They must be filled out on paper & picked up from our office by the patient or authorized caretaker.  Prescriptions cannot be filled after 4 pm nor on weekends.    WHEN TO CALL US [phone] Severe uncontrolled or worsening pain  Fever over 101 F (38.5 C) Concerns with the incision: Worsening pain, redness, rash/hives, swelling, bleeding, or drainage Reactions / problems with new medications (itching, rash, hives, nausea, etc.) Nausea and/or vomiting Difficulty urinating Difficulty breathing Worsening fatigue, dizziness, lightheadedness, blurred vision Other concerns If you are not getting better after two weeks or are noticing you are getting worse, contact our office [phone] for further advice.  We may need to adjust your medications, re-evaluate you in the office, send you to the emergency room, or see what other things we can do to help. The clinic staff is available to answer your questions during regular business hours (8:30am-5pm).  Please don't hesitate to call and ask to speak to one of our nurses for clinical concerns.    A surgeon from [facility] is always on call at the hospitals 24 hours/day If you have a medical emergency, go to the nearest emergency room or call 911.  FOLLOW UP in our office One the day of your discharge from the hospital (or the next business weekday), please call [facility] to set up or confirm an appointment to see your surgeon in the office for a follow-up appointment.  Usually it is 2-3 weeks after your surgery.   If you  have skin staples at your incision(s), let the office know so we can set up a time in the office for the nurse to remove them (usually around 10 days after surgery). Make sure that you call for appointments the day of discharge (or the next business weekday) from the hospital to ensure a convenient appointment time. IF YOU HAVE DISABILITY OR FAMILY LEAVE FORMS, BRING THEM TO THE OFFICE FOR PROCESSING.  DO NOT GIVE THEM TO YOUR DOCTOR.  [facility] [address], [address], [place], [place]  [address] ? [phone] - Main [phone] - Toll Free,  [phone] - Fax [email or web address]    GETTING TO GOOD BOWEL HEALTH. It is expected for your digestive tract to   need a few months to get back to normal.  It is common for your bowel movements and stools to be irregular.  You will have occasional bloating and cramping that should eventually fade away.  Until you are eating solid food normally, off all pain medications, and back to regular activities; your bowels will not be normal.   Avoiding constipation The goal: ONE SOFT BOWEL MOVEMENT A DAY!    Drink plenty of fluids.  Choose water first. TAKE A FIBER SUPPLEMENT EVERY DAY THE REST OF YOUR LIFE During your first week back home, gradually add back a fiber supplement every day Experiment which form you can tolerate.   There are many forms such as powders, tablets, wafers, gummies, etc Psyllium bran (Metamucil), methylcellulose (Citrucel), Miralax or Glycolax, Benefiber, Flax Seed.  Adjust the dose week-by-week (1/2 dose/day to 6 doses a day) until you are moving your bowels 1-2 times a day.  Cut back the dose or try a different fiber product if it is giving you problems such as diarrhea or bloating. Sometimes a laxative is needed to help jump-start bowels if constipated until the fiber supplement can help regulate your bowels.  If you are tolerating eating & you are farting, it is okay to try a gentle  laxative such as double dose MiraLax, prune juice, or Milk of Magnesia.  Avoid using laxatives too often. Stool softeners can sometimes help counteract the constipating effects of narcotic pain medicines.  It can also cause diarrhea, so avoid using for too long. If you are still constipated despite taking fiber daily, eating solids, and a few doses of laxatives, call our office. Controlling diarrhea Try drinking liquids and eating bland foods for a few days to avoid stressing your intestines further. Avoid dairy products (especially milk & ice cream) for a short time.  The intestines often can lose the ability to digest lactose when stressed. Avoid foods that cause gassiness or bloating.  Typical foods include beans and other legumes, cabbage, broccoli, and dairy foods.  Avoid greasy, spicy, fast foods.  Every person has some sensitivity to other foods, so listen to your body and avoid those foods that trigger problems for you. Probiotics (such as active yogurt, Align, etc) may help repopulate the intestines and colon with normal bacteria and calm down a sensitive digestive tract Adding a fiber supplement gradually can help thicken stools by absorbing excess fluid and retrain the intestines to act more normally.  Slowly increase the dose over a few weeks.  Too much fiber too soon can backfire and cause cramping & bloating. It is okay to try and slow down diarrhea with a few doses of antidiarrheal medicines.   Bismuth subsalicylate (ex. Kayopectate, Pepto Bismol) for a few doses can help control diarrhea.  Avoid if pregnant.   Loperamide (Imodium) can slow down diarrhea.  Start with one tablet (2mg) first.  Avoid if you are having fevers or severe pain.  ILEOSTOMY PATIENTS WILL HAVE CHRONIC DIARRHEA since their colon is not in use.    Drink plenty of liquids.  You will need to drink even more glasses of water/liquid a day to avoid getting dehydrated. Record output from your ileostomy.  Expect to empty  the bag every 3-4 hours at first.  Most people with a permanent ileostomy empty their bag 4-6 times at the least.   Use antidiarrheal medicine (especially Imodium) several times a day to avoid getting dehydrated.  Start with a dose at bedtime & breakfast.  Adjust up or   down as needed.  Increase antidiarrheal medications as directed to avoid emptying the bag more than 8 times a day (every 3 hours). Work with your wound ostomy nurse to learn care for your ostomy.  See ostomy care instructions. TROUBLESHOOTING IRREGULAR BOWELS 1) Start with a soft & bland diet. No spicy, greasy, or fried foods.  2) Avoid gluten/wheat or dairy products from diet to see if symptoms improve. 3) Miralax 17gm or flax seed mixed in 8oz. water or juice-daily. May use 2-4 times a day as needed. 4) Gas-X, Phazyme, etc. as needed for gas & bloating.  5) Prilosec (omeprazole) over-the-counter as needed 6)  Consider probiotics (Align, Activa, etc) to help calm the bowels down  Call your doctor if you are getting worse or not getting better.  Sometimes further testing (cultures, endoscopy, X-ray studies, CT scans, bloodwork, etc.) may be needed to help diagnose and treat the cause of the diarrhea. [facility] [address], [address], [place], [place]  [address] [phone] - Main.    [phone]  - Toll Free.   [phone] - Fax [email or web address]   ###############################   #######################################################  Ostomy Support Information  You've heard that people get along just fine with only one of their eyes, or one of their lungs, or one of their kidneys. But you also know that you have only one intestine and only one bladder, and that leaves you feeling awfully empty, both physically and emotionally: You think no other people go around without part of their intestine with the ends of their intestines sticking out through their abdominal walls.    YOU ARE NOT ALONE.  There are nearly three quarters of a million people in the [place] who have an ostomy; people who have had surgery to remove all or part of their colons or bladders.   There is even a national association, the [employer] with over 350 local affiliated support groups that are organized by volunteers who provide peer support and counseling. [employer] has a toll free telephone num-ber, 800-826-0826 and an educational, interactive website, www.ostomy.org   An ostomy is an opening in the belly (abdominal wall) made by surgery. Ostomates are people who have had this procedure. The opening (stoma) allows the kidney or bowel to grdischarge waste. An external pouch covers the stoma to collect waste. Pouches are are a simple bag and are odor free. Different companies have disposable or reusable pouches to fit one's lifestyle. An ostomy can either be temporary or permanent.   THERE ARE THREE MAIN TYPES OF OSTOMIES Colostomy. A colostomy is a surgically created opening in the large intestine (colon). Ileostomy. An ileostomy is a surgically created opening in the small intestine. Urostomy. A urostomy is a surgically created opening to divert urine away from the bladder.  OSTOMY Care  The following guidelines will make care of your colostomy easier. Keep this information close by for quick reference.  Helpful DIET hints Eat a well-balanced diet including vegetables and fresh fruits. Eat on a regular schedule.  Drink at least 6 to 8 glasses of fluids daily. Eat slowly in a relaxed atmosphere. Chew your food thoroughly. Avoid chewing gum, smoking, and drinking from a straw. This will help decrease the amount of air you swallow, which may help reduce gas. Eating yogurt or drinking buttermilk may help reduce gas.  To control gas at night, do not eat after 8 p.m. This will give your bowel time to quiet down before you go   to bed.  If gas is a problem, you can purchase  Beano. Sprinkle Beano on the first bite of food before eating to reduce gas. It has no flavor and should not change the taste of your food. You can buy Beano over the counter at your local drugstore.  Foods like fish, onions, garlic, broccoli, asparagus, and cabbage produce odor. Although your pouch is odor-proof, if you eat these foods you may notice a stronger odor when emptying your pouch. If this is a concern, you may want to limit these foods in your diet.  If you have an ileostomy, you will have chronic diarrhea & need to drink more liquids to avoid getting dehydrated.  Consider antidiarrheal medicine like imodium (loperamide) or Lomotil to help slow down bowel movements / diarrhea into your ileostomy bag.  GETTING TO GOOD BOWEL HEALTH WITH AN ILEOSTOMY    With the colon bypassed & not in use, you will have small bowel diarrhea.   It is important to thicken & slow your bowel movements down.   The goal: 4-6 small BOWEL MOVEMENTS A DAY It is important to drink plenty of liquids to avoid getting dehydrated  CONTROLLING ILEOSTOMY DIARRHEA  TAKE A FIBER SUPPLEMENT (FiberCon or Benefiner soluble fiber) twice a day - to thicken stools by absorbing excess fluid and retrain the intestines to act more normally.  Slowly increase the dose over a few weeks.  Too much fiber too soon can backfire and cause cramping & bloating.  TAKE AN IRON SUPPLEMENT twice a day to naturally constipate your bowels.  Usually ferrous sulfate 325mg twice a day)  TAKE ANTI-DIARRHEAL MEDICINES: Loperamide (Imodium) can slow down diarrhea.  Start with two tablets (= 4mg) first and then try one tablet every 6 hours.  Can go up to 2 pills four times day (8 pills of 2mg max) Avoid if you are having fevers or severe pain.  If you are not better or start feeling worse, stop all medicines and call your doctor for advice LoMotil (Diphenoxylate / Atropine) is another medicine that can constipate & slow down bowel moevements Pepto  Bismol (bismuth) can gently thicken bowels as well  If diarrhea is worse,: drink plenty of liquids and try simpler foods for a few days to avoid stressing your intestines further. Avoid dairy products (especially milk & ice cream) for a short time.  The intestines often can lose the ability to digest lactose when stressed. Avoid foods that cause gassiness or bloating.  Typical foods include beans and other legumes, cabbage, broccoli, and dairy foods.  Every person has some sensitivity to other foods, so listen to our body and avoid those foods that trigger problems for you.Call your doctor if you are getting worse or not better.  Sometimes further testing (cultures, endoscopy, X-ray studies, bloodwork, etc) may be needed to help diagnose and treat the cause of the diarrhea. Take extra anti-diarrheal medicines (maximum is 8 pills of 2mg loperamide a day)   Tips for POUCHING an OSTOMY   Changing Your Pouch The best time to change your pouch is in the morning, before eating or drinking anything. Your stoma can function at any time, but it will function more after eating or drinking.   Applying the pouching system  Place all your equipment close at hand before removing your pouch.  Wash your hands.  Stand or sit in front of a mirror. Use the position that works best for you. Remember that you must keep the skin around the stoma   wrinkle-free for a good seal.  Gently remove the used pouch (1-piece system) or the pouch and old wafer (2-piece system). Empty the pouch into the toilet. Save the closure clip to use again.  Wash the stoma itself and the skin around the stoma. Your stoma may bleed a Pascal when being washed. This is normal. Rinse and pat dry. You may use a wash cloth or soft paper towels (like Bounty), mild soap (like Dial, Safeguard, or Ivory), and water. Avoid soaps that contain perfumes or lotions.  For a new pouch (1-piece system) or a new wafer (2-piece system), measure your  stoma using the stoma guide in each box of supplies.  Trace the shape of your stoma onto the back of the new pouch or the back of the new wafer. Cut out the opening. Remove the paper backing and set it aside.  Optional: Apply a skin barrier powder to surrounding skin if it is irritated (bare or weeping), and dust off the excess. Optional: Apply a skin-prep wipe (such as Skin Prep or All-Kare) to the skin around the stoma, and let it dry. Do not apply this solution if the skin is irritated (red, tender, or broken) or if you have shaved around the stoma. Optional: Apply a skin barrier paste (such as Stomahesive, Coloplast, or Premium) around the opening cut in the back of the pouch or wafer. Allow it to dry for 30 to 60 seconds.  Hold the pouch (1-piece system) or wafer (2-piece system) with the sticky side toward your body. Make sure the skin around the stoma is wrinkle-free. Center the opening on the stoma, then press firmly to your abdomen (Fig. 4). Look in the mirror to check if you are placing the pouch, or wafer, in the right position. For a 2-piece system, snap the pouch onto the wafer. Make sure it snaps into place securely.  Place your hand over the stoma and the pouch or wafer for about 30 seconds. The heat from your hand can help the pouch or wafer stick to your skin.  Add deodorant (such as Super Banish or Nullo) to your pouch. Other options include food extracts such as vanilla oil and peppermint extract. Add about 10 drops of the deodorant to the pouch. Then apply the closure clamp. Note: Do not use toxic  chemicals or commercial cleaning agents in your pouch. These substances may harm the stoma.  Optional: For extra seal, apply tape to all 4 sides around the pouch or wafer, as if you were framing a picture. You may use any brand of medical adhesive tape. Change your pouch every 5 to 7 days. Change it immediately if a leak occurs.  Wash your hands afterwards.  If you are wearing a  2-piece system, you may use 2 new pouches per week and alternate them. Rinse the pouch with mild soap and warm water and hang it to dry for the next day. Apply the fresh pouch. Alternate the 2 pouches like this for a week. After a week, change the wafer and begin with 2 new pouches. Place the old pouches in a plastic bag, and put them in the trash.   LIVING WITH AN OSTOMY  Emptying Your Pouch Empty your pouch when it is one-third full (of urine, stool, and/or gas). If you wait until your pouch is fuller than this, it will be more difficult to empty and more noticeable. When you empty your pouch, either put toilet paper in the toilet bowl first, or flush the   toilet while you empty the pouch. This will reduce splashing. You can empty the pouch between your legs or to one side while sitting, or while standing or stooping. If you have a 2-piece system, you can snap off the pouch to empty it. Remember that your stoma may function during this time. If you wish to rinse your pouch after you empty it, a turkey baster can be helpful. When using a baster, squirt water up into the pouch through the opening at the bottom. With a 2-piece system, you can snap off the pouch to rinse it. After rinsing  your pouch, empty it into the toilet. When rinsing your pouch at home, put a few granules of Dreft soap in the rinse water. This helps lubricate and freshen your pouch. The inside of your pouch can be sprayed with non-stick cooking oil (Pam spray). This may help reduce stool sticking to the inside of the pouch.  Bathing You may shower or bathe with your pouch on or off. Remember that your stoma may function during this time.  The materials you use to wash your stoma and the skin around it should be clean, but they do not need to be sterile.  Wearing Your Pouch During hot weather, or if you perspire a lot in general, wear a cover over your pouch. This may prevent a rash on your skin under the pouch. Pouch covers are  sold at ostomy supply stores. Wear the pouch inside your underwear for better support. Watch your weight. Any gain or loss of 10 to 15 pounds or more can change the way your pouch fits.  Going Away From Home A collapsible cup (like those that come in travel kits) or a soft plastic squirt bottle with a pull-up top (like a travel bottle for shampoo) can be used for rinsing your pouch when you are away from home. Tilt the opening of the pouch at an upward angle when using a cup to rinse.  Carry wet wipes or extra tissues to use in public bathrooms.  Carry an extra pouching system with you at all times.  Never keep ostomy supplies in the glove compartment of your car. Extreme heat or cold can damage the skin barriers and adhesive wafers on the pouch.  When you travel, carry your ostomy supplies with you at all times. Keep them within easy reach. Do not pack ostomy supplies in baggage that will be checked or otherwise separated from you, because your baggage might be lost. If you're traveling out of the country, it is helpful to have a letter stating that you are carrying ostomy supplies as a medical necessity.  If you need ostomy supplies while traveling, look in the yellow pages of the telephone book under "Surgical Supplies." Or call the local ostomy organization to find out where supplies are available.  Do not let your ostomy supplies get low. Always order new pouches before you use the last one.  Reducing Odor Limit foods such as broccoli, cabbage, onions, fish, and garlic in your diet to help reduce odor. Each time you empty your pouch, carefully clean the opening of the pouch, both inside and outside, with toilet paper. Rinse your pouch 1 or 2 times daily after you empty it (see directions for emptying your pouch and going away from home). Add deodorant (such as Super Banish or Nullo) to your pouch. Use air deodorizers in your bathroom. Do not add aspirin to your pouch. Even though  aspirin can help prevent odor, it   could cause ulcers on your stoma.  When to call the doctor Call the doctor if you have any of the following symptoms: Purple, black, or white stoma Severe cramps lasting more than 6 hours Severe watery discharge from the stoma lasting more than 6 hours No output from the colostomy for 3 days Excessive bleeding from your stoma Swelling of your stoma to more than 1/2-inch larger than usual Pulling inward of your stoma below skin level Severe skin irritation or deep ulcers Bulging or other changes in your abdomen  When to call your ostomy nurse Call your ostomy/enterostomal therapy (WOCN) nurse if any of the following occurs: Frequent leaking of your pouching system Change in size or appearance of your stoma, causing discomfort or problems with your pouch Skin rash or rawness Weight gain or loss that causes problems with your pouch     FREQUENTLY ASKED QUESTIONS   Why haven't you met any of these folks who have an ostomy?  Well, maybe you have! You just did not recognize them because an ostomy doesn't show. It can be kept secret if you wish. Why, maybe some of your best friends, office associates or neighbors have an ostomy ... you never can tell. People facing ostomy surgery have many quality-of-life questions like: Will you bulge? Smell? Make noises? Will you feel waste leaving your body? Will you be a captive of the toilet? Will you starve? Be a social outcast? Get/stay married? Have babies? Easily bathe, go swimming, bend over?  OK, let's look at what you can expect:   Will you bulge?  Remember, without part of the intestine or bladder, and its contents, you should have a flatter tummy than before. You can expect to wear, with [place] exception, what you wore before surgery ... and this in-cludes tight clothing and bathing suits.   Will you smell?  Today, thanks to modern odor proof pouching systems, you can walk into an ostomy support group  meeting and not smell anything that is foul or offensive. And, for those with an ileostomy or colostomy who are concerned about odor when emptying their pouch, there are in-pouch deodorants that can be used to eliminate any waste odors that may exist.   Will you make noises?  Everyone produces gas, especially if they are an air-swallower. But intestinal sounds that occur from time to time are no differ-ent than a gurgling tummy, and quite often your clothing will muffle any sounds.   Will you feel the waste discharges?  For those with a colostomy or ileostomy there might be a slight pressure when waste leaves your body, but understand that the intestines have no nerve endings, so there will be no unpleasant sensations. Those with a urostomy will probably be unaware of any kidney drainage.   Will you be a captive of the toilet?  Immediately post-op you will spend more time in the bathroom than you will after your body recovers from surgery. Every person is different, but on average those with an ileostomy or urostomy may empty their pouches 4 to 6 times a day; a [place]ender[place]  less if you have a colostomy. The average wear time between pouch system changes is 3 to 5 days and the changing process should take less than 30 minutes.   Will I need to be on a special diet? Most people return to their normal diet when they have recovered from surgery. Be sure to chew your food well, eat a well-balanced diet and drink plenty of fluids. If   you experience problems with a certain food, wait a couple of weeks and try it again.  Will there be odor and noises? Pouching systems are designed to be odor-proof or odor-resistant. There are deodorants that can be used in the pouch. Medications are also available to help reduce odor. Limit gas-producing foods and carbonated beverages. You will experience less gas and fewer noises as you heal from surgery.  How much time will it take to care for my ostomy? At first, you may  spend a lot of time learning about your ostomy and how to take care of it. As you become more comfortable and skilled at changing the pouching system, it will take very [place] time to care for it.   Will I be able to return to work? People with ostomies can perform most jobs. As soon as you have healed from surgery, you should be able to return to work. Heavy lifting (more than 10 pounds) may be discouraged.   What about intimacy? Sexual relationships and intimacy are important and fulfilling aspects of your life. They should continue after ostomy surgery. Intimacy-related concerns should be discussed openly between you and your partner.   Can I wear regular clothing? You do not need to wear special clothing. Ostomy pouches are fairly flat and barely noticeable. Elastic undergarments will not hurt the stoma or prevent the ostomy from functioning.   Can I participate in sports? An ostomy should not limit your involvement in sports. Many people with ostomies are runners, skiers, swimmers or participate in other active lifestyles. Talk with your caregiver first before doing heavy physical activity.  Will you starve?  Not if you follow doctor's orders at each stage of your post-op adjustment. There is no such thing as an "ostomy diet". Some people with an ostomy will be able to eat and tolerate anything; others may find diffi-culty with some foods. Each person is an individual and must determine, by trial, what is best for them. A good practice for all is to drink plenty of water.   Will you be a social outcast?  Have you met anyone who has an ostomy and is a social outcast? Why should you be the first? Only your attitude and self image will effect how you are treated. No confi-dent person is an outcast.    PROFESSIONAL HELP   Resources are available if you need help or have questions about your ostomy.   Specially trained nurses called Wound, Ostomy Continence Nurses (WOCN) are available for  consultation in most major medical centers.  Consider getting an ostomy consult at an outpatient ostomy clinic.   [place] has an Ost[facility] Clinic run by an WOCN ostomy nurse at the [facility].  [phone]. [facility] can help set up an appointment   The United Ostomy Association (UOA) is a group made up of many local chapters throughout the [place]. These local groups hold meetings and provide support to prospective and existing ostomates. They sponsor educational events and have qualified visitors to make personal or telephone visits. Contact the UOA for the chapter nearest you and for other educational publications.  More detailed information can be found in Colostomy Guide, a publication of the United Ostomy Association (UOA). Contact UOA at 1-800-826-0826 or visit their web site at www.uoaa.org. The website contains links to other sites, suppliers and resources.  [employer] Secure Start Services: Start at the website to enlist for support.  Your Wound Ostomy (WOCN) nurse may have started this   process. https://www.hollister.com/en/securestart Secure Start services are designed to support people as they live their lives with an ostomy or neurogenic bladder. Enrolling is easy and at no cost to the patient. We realize that each person's needs and life journey are different. Through Secure Start services, we want to help people live their life, their way.  #######################################################  

## 2020-08-20 ENCOUNTER — Other Ambulatory Visit: Payer: Self-pay | Admitting: Family Medicine

## 2020-08-20 DIAGNOSIS — D509 Iron deficiency anemia, unspecified: Secondary | ICD-10-CM | POA: Diagnosis not present

## 2020-08-20 DIAGNOSIS — D631 Anemia in chronic kidney disease: Secondary | ICD-10-CM | POA: Diagnosis not present

## 2020-08-20 DIAGNOSIS — C19 Malignant neoplasm of rectosigmoid junction: Secondary | ICD-10-CM | POA: Diagnosis not present

## 2020-08-20 DIAGNOSIS — I4891 Unspecified atrial fibrillation: Secondary | ICD-10-CM | POA: Diagnosis not present

## 2020-08-20 DIAGNOSIS — N184 Chronic kidney disease, stage 4 (severe): Secondary | ICD-10-CM | POA: Diagnosis not present

## 2020-08-20 DIAGNOSIS — Z9181 History of falling: Secondary | ICD-10-CM | POA: Diagnosis not present

## 2020-08-20 DIAGNOSIS — Z87891 Personal history of nicotine dependence: Secondary | ICD-10-CM | POA: Diagnosis not present

## 2020-08-20 DIAGNOSIS — Z7901 Long term (current) use of anticoagulants: Secondary | ICD-10-CM | POA: Diagnosis not present

## 2020-08-20 DIAGNOSIS — I2510 Atherosclerotic heart disease of native coronary artery without angina pectoris: Secondary | ICD-10-CM | POA: Diagnosis not present

## 2020-08-20 DIAGNOSIS — D630 Anemia in neoplastic disease: Secondary | ICD-10-CM | POA: Diagnosis not present

## 2020-08-20 DIAGNOSIS — Z86010 Personal history of colonic polyps: Secondary | ICD-10-CM | POA: Diagnosis not present

## 2020-08-20 DIAGNOSIS — C434 Malignant melanoma of scalp and neck: Secondary | ICD-10-CM | POA: Diagnosis not present

## 2020-08-20 DIAGNOSIS — Z466 Encounter for fitting and adjustment of urinary device: Secondary | ICD-10-CM | POA: Diagnosis not present

## 2020-08-20 DIAGNOSIS — H9190 Unspecified hearing loss, unspecified ear: Secondary | ICD-10-CM | POA: Diagnosis not present

## 2020-08-20 DIAGNOSIS — Z483 Aftercare following surgery for neoplasm: Secondary | ICD-10-CM | POA: Diagnosis not present

## 2020-08-20 DIAGNOSIS — Z960 Presence of urogenital implants: Secondary | ICD-10-CM

## 2020-08-20 DIAGNOSIS — E43 Unspecified severe protein-calorie malnutrition: Secondary | ICD-10-CM | POA: Diagnosis not present

## 2020-08-20 DIAGNOSIS — Z8673 Personal history of transient ischemic attack (TIA), and cerebral infarction without residual deficits: Secondary | ICD-10-CM | POA: Diagnosis not present

## 2020-08-20 LAB — SURGICAL PATHOLOGY

## 2020-08-20 NOTE — Progress Notes (Signed)
Referral to urology placed per patient request for cathter care after discharge from [place], DO

## 2020-08-21 ENCOUNTER — Encounter: Payer: Self-pay | Admitting: Family Medicine

## 2020-08-21 DIAGNOSIS — K449 Diaphragmatic hernia without obstruction or gangrene: Secondary | ICD-10-CM

## 2020-08-21 DIAGNOSIS — K219 Gastro-esophageal reflux disease without esophagitis: Secondary | ICD-10-CM

## 2020-08-23 MED ORDER — FAMOTIDINE 20 MG PO TABS: 20.0000 mg | ORAL_TABLET | Freq: Two times a day (BID) | ORAL | 1 refills | Status: DC

## 2020-08-24 ENCOUNTER — Telehealth: Payer: Self-pay

## 2020-08-24 DIAGNOSIS — D509 Iron deficiency anemia, unspecified: Secondary | ICD-10-CM | POA: Diagnosis not present

## 2020-08-24 DIAGNOSIS — I2510 Atherosclerotic heart disease of native coronary artery without angina pectoris: Secondary | ICD-10-CM | POA: Diagnosis not present

## 2020-08-24 DIAGNOSIS — Z9181 History of falling: Secondary | ICD-10-CM | POA: Diagnosis not present

## 2020-08-24 DIAGNOSIS — Z87891 Personal history of nicotine dependence: Secondary | ICD-10-CM | POA: Diagnosis not present

## 2020-08-24 DIAGNOSIS — N184 Chronic kidney disease, stage 4 (severe): Secondary | ICD-10-CM | POA: Diagnosis not present

## 2020-08-24 DIAGNOSIS — H9190 Unspecified hearing loss, unspecified ear: Secondary | ICD-10-CM | POA: Diagnosis not present

## 2020-08-24 DIAGNOSIS — Z7901 Long term (current) use of anticoagulants: Secondary | ICD-10-CM | POA: Diagnosis not present

## 2020-08-24 DIAGNOSIS — Z466 Encounter for fitting and adjustment of urinary device: Secondary | ICD-10-CM | POA: Diagnosis not present

## 2020-08-24 DIAGNOSIS — C434 Malignant melanoma of scalp and neck: Secondary | ICD-10-CM | POA: Diagnosis not present

## 2020-08-24 DIAGNOSIS — Z483 Aftercare following surgery for neoplasm: Secondary | ICD-10-CM | POA: Diagnosis not present

## 2020-08-24 DIAGNOSIS — D630 Anemia in neoplastic disease: Secondary | ICD-10-CM | POA: Diagnosis not present

## 2020-08-24 DIAGNOSIS — I4891 Unspecified atrial fibrillation: Secondary | ICD-10-CM | POA: Diagnosis not present

## 2020-08-24 DIAGNOSIS — E43 Unspecified severe protein-calorie malnutrition: Secondary | ICD-10-CM | POA: Diagnosis not present

## 2020-08-24 DIAGNOSIS — Z86010 Personal history of colonic polyps: Secondary | ICD-10-CM | POA: Diagnosis not present

## 2020-08-24 DIAGNOSIS — Z8673 Personal history of transient ischemic attack (TIA), and cerebral infarction without residual deficits: Secondary | ICD-10-CM | POA: Diagnosis not present

## 2020-08-24 DIAGNOSIS — D631 Anemia in chronic kidney disease: Secondary | ICD-10-CM | POA: Diagnosis not present

## 2020-08-24 DIAGNOSIS — C19 Malignant neoplasm of rectosigmoid junction: Secondary | ICD-10-CM | POA: Diagnosis not present

## 2020-08-24 NOTE — Telephone Encounter (Signed)
Jim from [facility] calling for PT verbal orders as follows:  2 time(s) weekly for 4 week(s). Also requesting speech therapy evaluation for intermittent choking on food and medication.   Verbal orders given per [facility] protocol  Talbot Grumbling, RN

## 2020-08-25 DIAGNOSIS — C434 Malignant melanoma of scalp and neck: Secondary | ICD-10-CM | POA: Diagnosis not present

## 2020-08-25 DIAGNOSIS — D509 Iron deficiency anemia, unspecified: Secondary | ICD-10-CM | POA: Diagnosis not present

## 2020-08-25 DIAGNOSIS — C19 Malignant neoplasm of rectosigmoid junction: Secondary | ICD-10-CM | POA: Diagnosis not present

## 2020-08-25 DIAGNOSIS — I4891 Unspecified atrial fibrillation: Secondary | ICD-10-CM | POA: Diagnosis not present

## 2020-08-25 DIAGNOSIS — Z483 Aftercare following surgery for neoplasm: Secondary | ICD-10-CM | POA: Diagnosis not present

## 2020-08-25 DIAGNOSIS — Z87891 Personal history of nicotine dependence: Secondary | ICD-10-CM | POA: Diagnosis not present

## 2020-08-25 DIAGNOSIS — I2510 Atherosclerotic heart disease of native coronary artery without angina pectoris: Secondary | ICD-10-CM | POA: Diagnosis not present

## 2020-08-25 DIAGNOSIS — Z86010 Personal history of colonic polyps: Secondary | ICD-10-CM | POA: Diagnosis not present

## 2020-08-25 DIAGNOSIS — R339 Retention of urine, unspecified: Secondary | ICD-10-CM | POA: Diagnosis not present

## 2020-08-25 DIAGNOSIS — Z7901 Long term (current) use of anticoagulants: Secondary | ICD-10-CM | POA: Diagnosis not present

## 2020-08-25 DIAGNOSIS — N184 Chronic kidney disease, stage 4 (severe): Secondary | ICD-10-CM | POA: Diagnosis not present

## 2020-08-25 DIAGNOSIS — Z9181 History of falling: Secondary | ICD-10-CM | POA: Diagnosis not present

## 2020-08-25 DIAGNOSIS — E43 Unspecified severe protein-calorie malnutrition: Secondary | ICD-10-CM | POA: Diagnosis not present

## 2020-08-25 DIAGNOSIS — Z466 Encounter for fitting and adjustment of urinary device: Secondary | ICD-10-CM | POA: Diagnosis not present

## 2020-08-25 DIAGNOSIS — D631 Anemia in chronic kidney disease: Secondary | ICD-10-CM | POA: Diagnosis not present

## 2020-08-25 DIAGNOSIS — D630 Anemia in neoplastic disease: Secondary | ICD-10-CM | POA: Diagnosis not present

## 2020-08-25 DIAGNOSIS — H9190 Unspecified hearing loss, unspecified ear: Secondary | ICD-10-CM | POA: Diagnosis not present

## 2020-08-25 DIAGNOSIS — Z8673 Personal history of transient ischemic attack (TIA), and cerebral infarction without residual deficits: Secondary | ICD-10-CM | POA: Diagnosis not present

## 2020-08-27 ENCOUNTER — Telehealth: Payer: Self-pay

## 2020-08-27 DIAGNOSIS — D630 Anemia in neoplastic disease: Secondary | ICD-10-CM | POA: Diagnosis not present

## 2020-08-27 DIAGNOSIS — Z7901 Long term (current) use of anticoagulants: Secondary | ICD-10-CM | POA: Diagnosis not present

## 2020-08-27 DIAGNOSIS — I2510 Atherosclerotic heart disease of native coronary artery without angina pectoris: Secondary | ICD-10-CM | POA: Diagnosis not present

## 2020-08-27 DIAGNOSIS — D631 Anemia in chronic kidney disease: Secondary | ICD-10-CM | POA: Diagnosis not present

## 2020-08-27 DIAGNOSIS — C19 Malignant neoplasm of rectosigmoid junction: Secondary | ICD-10-CM | POA: Diagnosis not present

## 2020-08-27 DIAGNOSIS — D509 Iron deficiency anemia, unspecified: Secondary | ICD-10-CM | POA: Diagnosis not present

## 2020-08-27 DIAGNOSIS — H9190 Unspecified hearing loss, unspecified ear: Secondary | ICD-10-CM | POA: Diagnosis not present

## 2020-08-27 DIAGNOSIS — C434 Malignant melanoma of scalp and neck: Secondary | ICD-10-CM | POA: Diagnosis not present

## 2020-08-27 DIAGNOSIS — Z9181 History of falling: Secondary | ICD-10-CM | POA: Diagnosis not present

## 2020-08-27 DIAGNOSIS — N184 Chronic kidney disease, stage 4 (severe): Secondary | ICD-10-CM | POA: Diagnosis not present

## 2020-08-27 DIAGNOSIS — Z466 Encounter for fitting and adjustment of urinary device: Secondary | ICD-10-CM | POA: Diagnosis not present

## 2020-08-27 DIAGNOSIS — Z87891 Personal history of nicotine dependence: Secondary | ICD-10-CM | POA: Diagnosis not present

## 2020-08-27 DIAGNOSIS — E43 Unspecified severe protein-calorie malnutrition: Secondary | ICD-10-CM | POA: Diagnosis not present

## 2020-08-27 DIAGNOSIS — Z86010 Personal history of colonic polyps: Secondary | ICD-10-CM | POA: Diagnosis not present

## 2020-08-27 DIAGNOSIS — Z483 Aftercare following surgery for neoplasm: Secondary | ICD-10-CM | POA: Diagnosis not present

## 2020-08-27 DIAGNOSIS — Z8673 Personal history of transient ischemic attack (TIA), and cerebral infarction without residual deficits: Secondary | ICD-10-CM | POA: Diagnosis not present

## 2020-08-27 DIAGNOSIS — I4891 Unspecified atrial fibrillation: Secondary | ICD-10-CM | POA: Diagnosis not present

## 2020-08-27 NOTE — Telephone Encounter (Signed)
Clair Gulling, PT at [facility] health calls nurse line to report abnormal vital signs.   Vitals today at PT  Sitting BP: 90/60 Standing BP: 70/50 States HR increased at least 20-30 when standing.   At the end of visit, after exercise, BP: 120/60.  Reports that patient was asymptomatic the entire visit.   Called and spoke with patient. Reports that he "feels great" and is not having any symptoms. Patient lives with granddaughter who helps with his care. Scheduled patient follow up appointment on 09/10/20.   Patient voiced understanding of ED precautions.   *Precepted with Dr. Andria Frames who is agreeable with plan.   Talbot Grumbling, RN

## 2020-08-30 DIAGNOSIS — H9190 Unspecified hearing loss, unspecified ear: Secondary | ICD-10-CM | POA: Diagnosis not present

## 2020-08-30 DIAGNOSIS — I2510 Atherosclerotic heart disease of native coronary artery without angina pectoris: Secondary | ICD-10-CM | POA: Diagnosis not present

## 2020-08-30 DIAGNOSIS — C434 Malignant melanoma of scalp and neck: Secondary | ICD-10-CM | POA: Diagnosis not present

## 2020-08-30 DIAGNOSIS — Z86010 Personal history of colonic polyps: Secondary | ICD-10-CM | POA: Diagnosis not present

## 2020-08-30 DIAGNOSIS — E43 Unspecified severe protein-calorie malnutrition: Secondary | ICD-10-CM | POA: Diagnosis not present

## 2020-08-30 DIAGNOSIS — D509 Iron deficiency anemia, unspecified: Secondary | ICD-10-CM | POA: Diagnosis not present

## 2020-08-30 DIAGNOSIS — Z466 Encounter for fitting and adjustment of urinary device: Secondary | ICD-10-CM | POA: Diagnosis not present

## 2020-08-30 DIAGNOSIS — D630 Anemia in neoplastic disease: Secondary | ICD-10-CM | POA: Diagnosis not present

## 2020-08-30 DIAGNOSIS — Z7901 Long term (current) use of anticoagulants: Secondary | ICD-10-CM | POA: Diagnosis not present

## 2020-08-30 DIAGNOSIS — Z87891 Personal history of nicotine dependence: Secondary | ICD-10-CM | POA: Diagnosis not present

## 2020-08-30 DIAGNOSIS — N184 Chronic kidney disease, stage 4 (severe): Secondary | ICD-10-CM | POA: Diagnosis not present

## 2020-08-30 DIAGNOSIS — Z8673 Personal history of transient ischemic attack (TIA), and cerebral infarction without residual deficits: Secondary | ICD-10-CM | POA: Diagnosis not present

## 2020-08-30 DIAGNOSIS — Z483 Aftercare following surgery for neoplasm: Secondary | ICD-10-CM | POA: Diagnosis not present

## 2020-08-30 DIAGNOSIS — D631 Anemia in chronic kidney disease: Secondary | ICD-10-CM | POA: Diagnosis not present

## 2020-08-30 DIAGNOSIS — C19 Malignant neoplasm of rectosigmoid junction: Secondary | ICD-10-CM | POA: Diagnosis not present

## 2020-08-30 DIAGNOSIS — I4891 Unspecified atrial fibrillation: Secondary | ICD-10-CM | POA: Diagnosis not present

## 2020-08-30 DIAGNOSIS — Z9181 History of falling: Secondary | ICD-10-CM | POA: Diagnosis not present

## 2020-08-31 DIAGNOSIS — I2510 Atherosclerotic heart disease of native coronary artery without angina pectoris: Secondary | ICD-10-CM | POA: Diagnosis not present

## 2020-08-31 DIAGNOSIS — I4891 Unspecified atrial fibrillation: Secondary | ICD-10-CM | POA: Diagnosis not present

## 2020-08-31 DIAGNOSIS — D630 Anemia in neoplastic disease: Secondary | ICD-10-CM | POA: Diagnosis not present

## 2020-08-31 DIAGNOSIS — C434 Malignant melanoma of scalp and neck: Secondary | ICD-10-CM | POA: Diagnosis not present

## 2020-08-31 DIAGNOSIS — Z86010 Personal history of colonic polyps: Secondary | ICD-10-CM | POA: Diagnosis not present

## 2020-08-31 DIAGNOSIS — D631 Anemia in chronic kidney disease: Secondary | ICD-10-CM | POA: Diagnosis not present

## 2020-08-31 DIAGNOSIS — E43 Unspecified severe protein-calorie malnutrition: Secondary | ICD-10-CM | POA: Diagnosis not present

## 2020-08-31 DIAGNOSIS — Z9181 History of falling: Secondary | ICD-10-CM | POA: Diagnosis not present

## 2020-08-31 DIAGNOSIS — Z466 Encounter for fitting and adjustment of urinary device: Secondary | ICD-10-CM | POA: Diagnosis not present

## 2020-08-31 DIAGNOSIS — D509 Iron deficiency anemia, unspecified: Secondary | ICD-10-CM | POA: Diagnosis not present

## 2020-08-31 DIAGNOSIS — Z483 Aftercare following surgery for neoplasm: Secondary | ICD-10-CM | POA: Diagnosis not present

## 2020-08-31 DIAGNOSIS — Z7901 Long term (current) use of anticoagulants: Secondary | ICD-10-CM | POA: Diagnosis not present

## 2020-08-31 DIAGNOSIS — Z87891 Personal history of nicotine dependence: Secondary | ICD-10-CM | POA: Diagnosis not present

## 2020-08-31 DIAGNOSIS — H9190 Unspecified hearing loss, unspecified ear: Secondary | ICD-10-CM | POA: Diagnosis not present

## 2020-08-31 DIAGNOSIS — Z8673 Personal history of transient ischemic attack (TIA), and cerebral infarction without residual deficits: Secondary | ICD-10-CM | POA: Diagnosis not present

## 2020-08-31 DIAGNOSIS — C19 Malignant neoplasm of rectosigmoid junction: Secondary | ICD-10-CM | POA: Diagnosis not present

## 2020-08-31 DIAGNOSIS — N184 Chronic kidney disease, stage 4 (severe): Secondary | ICD-10-CM | POA: Diagnosis not present

## 2020-09-01 ENCOUNTER — Other Ambulatory Visit: Payer: Self-pay

## 2020-09-01 DIAGNOSIS — Z9181 History of falling: Secondary | ICD-10-CM | POA: Diagnosis not present

## 2020-09-01 DIAGNOSIS — D630 Anemia in neoplastic disease: Secondary | ICD-10-CM | POA: Diagnosis not present

## 2020-09-01 DIAGNOSIS — Z87891 Personal history of nicotine dependence: Secondary | ICD-10-CM | POA: Diagnosis not present

## 2020-09-01 DIAGNOSIS — D631 Anemia in chronic kidney disease: Secondary | ICD-10-CM | POA: Diagnosis not present

## 2020-09-01 DIAGNOSIS — N184 Chronic kidney disease, stage 4 (severe): Secondary | ICD-10-CM | POA: Diagnosis not present

## 2020-09-01 DIAGNOSIS — Z7901 Long term (current) use of anticoagulants: Secondary | ICD-10-CM | POA: Diagnosis not present

## 2020-09-01 DIAGNOSIS — Z483 Aftercare following surgery for neoplasm: Secondary | ICD-10-CM | POA: Diagnosis not present

## 2020-09-01 DIAGNOSIS — C434 Malignant melanoma of scalp and neck: Secondary | ICD-10-CM | POA: Diagnosis not present

## 2020-09-01 DIAGNOSIS — D509 Iron deficiency anemia, unspecified: Secondary | ICD-10-CM | POA: Diagnosis not present

## 2020-09-01 DIAGNOSIS — H9190 Unspecified hearing loss, unspecified ear: Secondary | ICD-10-CM | POA: Diagnosis not present

## 2020-09-01 DIAGNOSIS — Z466 Encounter for fitting and adjustment of urinary device: Secondary | ICD-10-CM | POA: Diagnosis not present

## 2020-09-01 DIAGNOSIS — I2510 Atherosclerotic heart disease of native coronary artery without angina pectoris: Secondary | ICD-10-CM | POA: Diagnosis not present

## 2020-09-01 DIAGNOSIS — Z86010 Personal history of colonic polyps: Secondary | ICD-10-CM | POA: Diagnosis not present

## 2020-09-01 DIAGNOSIS — C19 Malignant neoplasm of rectosigmoid junction: Secondary | ICD-10-CM | POA: Diagnosis not present

## 2020-09-01 DIAGNOSIS — Z8673 Personal history of transient ischemic attack (TIA), and cerebral infarction without residual deficits: Secondary | ICD-10-CM | POA: Diagnosis not present

## 2020-09-01 DIAGNOSIS — I4891 Unspecified atrial fibrillation: Secondary | ICD-10-CM | POA: Diagnosis not present

## 2020-09-01 DIAGNOSIS — E43 Unspecified severe protein-calorie malnutrition: Secondary | ICD-10-CM | POA: Diagnosis not present

## 2020-09-01 NOTE — Progress Notes (Signed)
The proposed treatment discussed in conference is for discussion purpose only and is not a binding recommendation.  The patients have not been physically examined, or presented with their treatment options.  Therefore, final treatment plans cannot be decided.  

## 2020-09-01 NOTE — Telephone Encounter (Signed)
Received phone call from PT regarding continued low BP. PT reports that sitting BP today is 98/56, standing: 70/50. Patient is asymptomatic, however, began to feel fatigued with exercises. After exercises, BP returned to 108/60.  Called patient daughter in law. Scheduled access to care appointment for tomorrow morning, as low BP has persisted since last week. Advised of ED precautions.   Precepted with Dr. Andria Frames who is agreeable with plan.   Talbot Grumbling, RN

## 2020-09-01 NOTE — Progress Notes (Signed)
    SUBJECTIVE:   CHIEF COMPLAINT / HPI:  low blood pressure  Asymptomatic.  Reports that he was told to come see doctor for low blood pressure.  Report BP in 70's.  Feels fine.  Drinking plenty of fluids. Does find it hard to swallow since surgery.  Has foley catheter in and no changes in urine.  Bowel movements normal now.  Denies any headaches, weakness, dizziness, chest pain, heart palpitations, shortness of breath.  No LOC or recent falls.  Denies any hematuria, hematochezia or diarrhea.  Was started on Flomax while in hospital for urinary retention.  He was discharged 07/27 and readmitted to hospital 07/29 for poor po intake and witnessed syncopal episode while having BM.  Had early post op bowel obstruction that resolved with NG tube placement. Was discharged on 08/04.    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 Abdomen soft and non-tender.  Extremities: No peripheral edema.  Neuro: CN II: PERRL CN III, IV,VI: EOMI CV V: Normal sensation in V1, V2, V3 CVII: Symmetric smile and brow raise CN VIII: Normal hearing CN IX,X: Symmetric palate raise  CN XI: 5/5 shoulder shrug CN XII: Symmetric tongue protrusion  UE and LE strength 5/5 2+ UE and LE reflexes  Normal sensation in UE and LE bilaterally  No ataxia with finger to nose, normal heel to shin  Negative Rhomberg     ASSESSMENT/PLAN:   Orthostatic hypotension Asymptomatic.  Orthostatics positive from sitting to standing with significant drop  > 38mmHg. ECG no significant changes from previous. I think likely Flomax contributing to low BP -Stop Flomax, has follow up with Urology next week -CMet, CBC, Cortisol today -Compression stockings -Midodrine 2.5 mg BID x 14 days -Continue to H2O hydration and Ensure -Follow up with PCP in 1 week  -Strict return precautions provided     Carollee Leitz, MD [place]

## 2020-09-02 ENCOUNTER — Ambulatory Visit (INDEPENDENT_AMBULATORY_CARE_PROVIDER_SITE_OTHER): Payer: Medicare Other | Admitting: Family Medicine

## 2020-09-02 ENCOUNTER — Ambulatory Visit (HOSPITAL_COMMUNITY)
Admission: RE | Admit: 2020-09-02 | Discharge: 2020-09-02 | Disposition: A | Discharge status: Home / Self Care | Payer: Medicare Other | Source: Ambulatory Visit | Attending: Family Medicine | Admitting: Family Medicine

## 2020-09-02 ENCOUNTER — Other Ambulatory Visit: Payer: Self-pay

## 2020-09-02 VITALS — BP 100/40 | HR 77 | Wt 99.0 lb

## 2020-09-02 DIAGNOSIS — I951 Orthostatic hypotension: Secondary | ICD-10-CM

## 2020-09-02 DIAGNOSIS — E875 Hyperkalemia: Secondary | ICD-10-CM | POA: Diagnosis not present

## 2020-09-02 DIAGNOSIS — I959 Hypotension, unspecified: Secondary | ICD-10-CM | POA: Diagnosis not present

## 2020-09-02 MED ORDER — MIDODRINE HCL 2.5 MG PO TABS: 2.5000 mg | ORAL_TABLET | Freq: Two times a day (BID) | ORAL | 0 refills | Status: DC

## 2020-09-02 NOTE — Patient Instructions (Addendum)
Thank you for coming to see me today. It was a pleasure.   We will get some labs today.  If they are abnormal or we need to do something about them, I will call you.  If they are normal, I will send you a message on MyChart (if it is active) or a letter in the mail.  If you don't hear from [place] in 2 weeks, please call the office at the number below.   Continue to drink fluids and  Ensure three times a day.  Use compression stockings daily  Stop Flomax  Start Midodrine 2.5 mg take 1 tablet two times a day for 2 weeks  Please follow-up with PCP in 1 week  If you have any questions or concerns, please do not hesitate to call the office at [phone].  Best,   Carollee Leitz, MD    Orthostatic Hypotension Blood pressure is a measurement of how strongly, or weakly, your blood is pressing against the walls of your arteries. Orthostatic hypotension is a sudden drop in blood pressure that happens when you quickly change positions,such as when you get up from sitting or lying down. Arteries are blood vessels that carry blood from your heart throughout your body. When blood pressure is too low, you may not get enough blood to your brain or to the rest of your organs. This can cause weakness, light-headedness, rapid heartbeat, and fainting. This can last for just a few seconds or for up to a few minutes. Orthostatic hypotension is usually not a serious problem. However, if it happens frequently or gets worse, it may be a sign of somethingmore serious. What are the causes? This condition may be caused by: Sudden changes in posture, such as standing up quickly after you have been sitting or lying down. Blood loss. Loss of body fluids (dehydration). Heart problems. Hormone (endocrine) problems. Pregnancy. Severe infection. Lack of certain nutrients. Severe allergic reactions (anaphylaxis). Certain medicines, such as blood pressure medicine or medicines that make the body lose excess fluids  (diuretics). Sometimes, this condition can be caused by not taking medicine as directed, such as taking too much of a certain medicine. What increases the risk? The following factors may make you more likely to develop this condition: Age. Risk increases as you get older. Conditions that affect the heart or the central nervous system. Taking certain medicines, such as blood pressure medicine or diuretics. Being pregnant. What are the signs or symptoms? Symptoms of this condition may include: Weakness. Light-headedness. Dizziness. Blurred vision. Fatigue. Rapid heartbeat. Fainting, in severe cases. How is this diagnosed? This condition is diagnosed based on: Your medical history. Your symptoms. Your blood pressure measurement. Your health care provider will check your blood pressure when you are: Lying down. Sitting. Standing. A blood pressure reading is recorded as two numbers, such as "120 over 80" (or 120/80). The first ("top") number is called the systolic pressure. It is a measure of the pressure in your arteries as your heart beats. The second ("bottom") number is called the diastolic pressure. It is a measure of the pressure in your arteries when your heart relaxes between beats. Blood pressure is measured in a unit called mm Hg. Healthy blood pressure for most adults is 120/80. If your blood pressure is below 90/60, you may be diagnosed withhypotension. Other information or tests that may be used to diagnose orthostatic hypotension include: Your other vital signs, such as your heart rate and temperature. Blood tests. Tilt table test. For this  test, you will be safely secured to a table that moves you from a lying position to an upright position. Your heart rhythm and blood pressure will be monitored during the test. How is this treated? This condition may be treated by: Changing your diet. This may involve eating more salt (sodium) or drinking more water. Taking medicines to  raise your blood pressure. Changing the dosage of certain medicines you are taking that might be lowering your blood pressure. Wearing compression stockings. These stockings help to prevent blood clots and reduce swelling in your legs. In some cases, you may need to go to the hospital for: Fluid replacement. This means you will receive fluids through an IV. Blood replacement. This means you will receive donated blood through an IV (transfusion). Treating an infection or heart problems, if this applies. Monitoring. You may need to be monitored while medicines that you are taking wear off. Follow these instructions at home: Eating and drinking  Drink enough fluid to keep your urine pale yellow. Eat a healthy diet, and follow instructions from your health care provider about eating or drinking restrictions. A healthy diet includes: Fresh fruits and vegetables. Whole grains. Lean meats. Low-fat dairy products. Eat extra salt only as directed. Do not add extra salt to your diet unless your health care provider told you to do that. Eat frequent, small meals. Avoid standing up suddenly after eating.  Medicines Take over-the-counter and prescription medicines only as told by your health care provider. Follow instructions from your health care provider about changing the dosage of your current medicines, if this applies. Do not stop or adjust any of your medicines on your own. General instructions  Wear compression stockings as told by your health care provider. Get up slowly from lying down or sitting positions. This gives your blood pressure a chance to adjust. Avoid hot showers and excessive heat as directed by your health care provider. Return to your normal activities as told by your health care provider. Ask your health care provider what activities are safe for you. Do not use any products that contain nicotine or tobacco, such as cigarettes, e-cigarettes, and chewing tobacco. If you  need help quitting, ask your health care provider. Keep all follow-up visits as told by your health care provider. This is important.  Contact a health care provider if you: Vomit. Have diarrhea. Have a fever for more than 2-3 days. Feel more thirsty than usual. Feel weak and tired. Get help right away if you: Have chest pain. Have a fast or irregular heartbeat. Develop numbness in any part of your body. Cannot move your arms or your legs. Have trouble speaking. Become sweaty or feel light-headed. Faint. Feel short of breath. Have trouble staying awake. Feel confused. Summary Orthostatic hypotension is a sudden drop in blood pressure that happens when you quickly change positions. Orthostatic hypotension is usually not a serious problem. It is diagnosed by having your blood pressure taken lying down, sitting, and then standing. It may be treated by changing your diet or adjusting your medicines. This information is not intended to replace advice given to you by your health care provider. Make sure you discuss any questions you have with your healthcare provider. Document Revised: 06/28/2017 Document Reviewed: 06/28/2017 Elsevier Patient Education  [employer].

## 2020-09-03 ENCOUNTER — Other Ambulatory Visit: Payer: Medicare Other

## 2020-09-03 ENCOUNTER — Telehealth: Payer: Self-pay | Admitting: Family Medicine

## 2020-09-03 DIAGNOSIS — E875 Hyperkalemia: Secondary | ICD-10-CM

## 2020-09-03 LAB — CBC
Hematocrit: 27.4 % — ABNORMAL LOW (ref 37.5–51.0)
Hemoglobin: 8.6 g/dL — ABNORMAL LOW (ref 13.0–17.7)
MCH: 27.7 pg (ref 26.6–33.0)
MCHC: 31.4 g/dL — ABNORMAL LOW (ref 31.5–35.7)
MCV: 88 fL (ref 79–97)
Platelets: 299 10*3/uL (ref 150–450)
RBC: 3.1 x10E6/uL — ABNORMAL LOW (ref 4.14–5.80)
RDW: 14.4 % (ref 11.6–15.4)
WBC: 7.1 10*3/uL (ref 3.4–10.8)

## 2020-09-03 LAB — BASIC METABOLIC PANEL
BUN/Creatinine Ratio: 12 (ref 10–24)
BUN: 24 mg/dL (ref 8–27)
CO2: 25 mmol/L (ref 20–29)
Calcium: 9.1 mg/dL (ref 8.6–10.2)
Chloride: 103 mmol/L (ref 96–106)
Creatinine, Ser: 1.98 mg/dL — ABNORMAL HIGH (ref 0.76–1.27)
Glucose: 101 mg/dL — ABNORMAL HIGH (ref 65–99)
Potassium: 5.8 mmol/L — ABNORMAL HIGH (ref 3.5–5.2)
Sodium: 138 mmol/L (ref 134–144)
eGFR: 34 mL/min/{1.73_m2} — ABNORMAL LOW (ref >59)

## 2020-09-03 LAB — LIPID PANEL
Chol/HDL Ratio: 2.8 ratio (ref 0.0–5.0)
Cholesterol, Total: 112 mg/dL (ref 100–199)
HDL: 40 mg/dL (ref >39)
LDL Chol Calc (NIH): 50 mg/dL (ref 0–99)
Triglycerides: 121 mg/dL (ref 0–149)
VLDL Cholesterol Cal: 22 mg/dL (ref 5–40)

## 2020-09-03 LAB — POTASSIUM: Potassium: 4.5 mmol/L (ref 3.5–5.2)

## 2020-09-03 LAB — CORTISOL: Cortisol: 15.3 ug/dL

## 2020-09-03 NOTE — Telephone Encounter (Signed)
Spoke with Granddaughter to inform of recent potassium results.  Initial results from yesterday 5.8, recollect today normal at 4.5.  Carollee Leitz, MD [facility]

## 2020-09-05 ENCOUNTER — Encounter: Payer: Self-pay | Admitting: Family Medicine

## 2020-09-05 DIAGNOSIS — I951 Orthostatic hypotension: Secondary | ICD-10-CM | POA: Insufficient documentation

## 2020-09-05 NOTE — Assessment & Plan Note (Signed)
Asymptomatic.  Orthostatics positive from sitting to standing with significant drop > 79mmHg. ECG no significant changes from previous. I think likely Flomax contributing to low BP -Stop Flomax, has follow up with Urology next week -CMet, CBC, Cortisol today -Compression stockings -Midodrine 2.5 mg BID x 14 days -Continue to H2O hydration and Ensure -Follow up with PCP in 1 week  -Strict return precautions provided

## 2020-09-06 DIAGNOSIS — Z7901 Long term (current) use of anticoagulants: Secondary | ICD-10-CM | POA: Diagnosis not present

## 2020-09-06 DIAGNOSIS — D509 Iron deficiency anemia, unspecified: Secondary | ICD-10-CM | POA: Diagnosis not present

## 2020-09-06 DIAGNOSIS — E43 Unspecified severe protein-calorie malnutrition: Secondary | ICD-10-CM | POA: Diagnosis not present

## 2020-09-06 DIAGNOSIS — Z9181 History of falling: Secondary | ICD-10-CM | POA: Diagnosis not present

## 2020-09-06 DIAGNOSIS — D631 Anemia in chronic kidney disease: Secondary | ICD-10-CM | POA: Diagnosis not present

## 2020-09-06 DIAGNOSIS — I2510 Atherosclerotic heart disease of native coronary artery without angina pectoris: Secondary | ICD-10-CM | POA: Diagnosis not present

## 2020-09-06 DIAGNOSIS — Z466 Encounter for fitting and adjustment of urinary device: Secondary | ICD-10-CM | POA: Diagnosis not present

## 2020-09-06 DIAGNOSIS — Z86010 Personal history of colonic polyps: Secondary | ICD-10-CM | POA: Diagnosis not present

## 2020-09-06 DIAGNOSIS — Z87891 Personal history of nicotine dependence: Secondary | ICD-10-CM | POA: Diagnosis not present

## 2020-09-06 DIAGNOSIS — C434 Malignant melanoma of scalp and neck: Secondary | ICD-10-CM | POA: Diagnosis not present

## 2020-09-06 DIAGNOSIS — D630 Anemia in neoplastic disease: Secondary | ICD-10-CM | POA: Diagnosis not present

## 2020-09-06 DIAGNOSIS — Z483 Aftercare following surgery for neoplasm: Secondary | ICD-10-CM | POA: Diagnosis not present

## 2020-09-06 DIAGNOSIS — C19 Malignant neoplasm of rectosigmoid junction: Secondary | ICD-10-CM | POA: Diagnosis not present

## 2020-09-06 DIAGNOSIS — Z8673 Personal history of transient ischemic attack (TIA), and cerebral infarction without residual deficits: Secondary | ICD-10-CM | POA: Diagnosis not present

## 2020-09-06 DIAGNOSIS — I4891 Unspecified atrial fibrillation: Secondary | ICD-10-CM | POA: Diagnosis not present

## 2020-09-06 DIAGNOSIS — H9190 Unspecified hearing loss, unspecified ear: Secondary | ICD-10-CM | POA: Diagnosis not present

## 2020-09-06 DIAGNOSIS — N184 Chronic kidney disease, stage 4 (severe): Secondary | ICD-10-CM | POA: Diagnosis not present

## 2020-09-07 DIAGNOSIS — D630 Anemia in neoplastic disease: Secondary | ICD-10-CM | POA: Diagnosis not present

## 2020-09-07 DIAGNOSIS — Z86010 Personal history of colonic polyps: Secondary | ICD-10-CM | POA: Diagnosis not present

## 2020-09-07 DIAGNOSIS — D631 Anemia in chronic kidney disease: Secondary | ICD-10-CM | POA: Diagnosis not present

## 2020-09-07 DIAGNOSIS — Z8673 Personal history of transient ischemic attack (TIA), and cerebral infarction without residual deficits: Secondary | ICD-10-CM | POA: Diagnosis not present

## 2020-09-07 DIAGNOSIS — E43 Unspecified severe protein-calorie malnutrition: Secondary | ICD-10-CM | POA: Diagnosis not present

## 2020-09-07 DIAGNOSIS — C19 Malignant neoplasm of rectosigmoid junction: Secondary | ICD-10-CM | POA: Diagnosis not present

## 2020-09-07 DIAGNOSIS — D509 Iron deficiency anemia, unspecified: Secondary | ICD-10-CM | POA: Diagnosis not present

## 2020-09-07 DIAGNOSIS — I2510 Atherosclerotic heart disease of native coronary artery without angina pectoris: Secondary | ICD-10-CM | POA: Diagnosis not present

## 2020-09-07 DIAGNOSIS — Z87891 Personal history of nicotine dependence: Secondary | ICD-10-CM | POA: Diagnosis not present

## 2020-09-07 DIAGNOSIS — H9190 Unspecified hearing loss, unspecified ear: Secondary | ICD-10-CM | POA: Diagnosis not present

## 2020-09-07 DIAGNOSIS — Z7901 Long term (current) use of anticoagulants: Secondary | ICD-10-CM | POA: Diagnosis not present

## 2020-09-07 DIAGNOSIS — C434 Malignant melanoma of scalp and neck: Secondary | ICD-10-CM | POA: Diagnosis not present

## 2020-09-07 DIAGNOSIS — N184 Chronic kidney disease, stage 4 (severe): Secondary | ICD-10-CM | POA: Diagnosis not present

## 2020-09-07 DIAGNOSIS — Z466 Encounter for fitting and adjustment of urinary device: Secondary | ICD-10-CM | POA: Diagnosis not present

## 2020-09-07 DIAGNOSIS — Z483 Aftercare following surgery for neoplasm: Secondary | ICD-10-CM | POA: Diagnosis not present

## 2020-09-07 DIAGNOSIS — Z9181 History of falling: Secondary | ICD-10-CM | POA: Diagnosis not present

## 2020-09-07 DIAGNOSIS — I4891 Unspecified atrial fibrillation: Secondary | ICD-10-CM | POA: Diagnosis not present

## 2020-09-09 DIAGNOSIS — I2510 Atherosclerotic heart disease of native coronary artery without angina pectoris: Secondary | ICD-10-CM | POA: Diagnosis not present

## 2020-09-09 DIAGNOSIS — Z9181 History of falling: Secondary | ICD-10-CM | POA: Diagnosis not present

## 2020-09-09 DIAGNOSIS — Z7901 Long term (current) use of anticoagulants: Secondary | ICD-10-CM | POA: Diagnosis not present

## 2020-09-09 DIAGNOSIS — D509 Iron deficiency anemia, unspecified: Secondary | ICD-10-CM | POA: Diagnosis not present

## 2020-09-09 DIAGNOSIS — Z483 Aftercare following surgery for neoplasm: Secondary | ICD-10-CM | POA: Diagnosis not present

## 2020-09-09 DIAGNOSIS — Z8673 Personal history of transient ischemic attack (TIA), and cerebral infarction without residual deficits: Secondary | ICD-10-CM | POA: Diagnosis not present

## 2020-09-09 DIAGNOSIS — H9190 Unspecified hearing loss, unspecified ear: Secondary | ICD-10-CM | POA: Diagnosis not present

## 2020-09-09 DIAGNOSIS — Z87891 Personal history of nicotine dependence: Secondary | ICD-10-CM | POA: Diagnosis not present

## 2020-09-09 DIAGNOSIS — D630 Anemia in neoplastic disease: Secondary | ICD-10-CM | POA: Diagnosis not present

## 2020-09-09 DIAGNOSIS — I4891 Unspecified atrial fibrillation: Secondary | ICD-10-CM | POA: Diagnosis not present

## 2020-09-09 DIAGNOSIS — E43 Unspecified severe protein-calorie malnutrition: Secondary | ICD-10-CM | POA: Diagnosis not present

## 2020-09-09 DIAGNOSIS — D631 Anemia in chronic kidney disease: Secondary | ICD-10-CM | POA: Diagnosis not present

## 2020-09-09 DIAGNOSIS — C19 Malignant neoplasm of rectosigmoid junction: Secondary | ICD-10-CM | POA: Diagnosis not present

## 2020-09-09 DIAGNOSIS — Z86010 Personal history of colonic polyps: Secondary | ICD-10-CM | POA: Diagnosis not present

## 2020-09-09 DIAGNOSIS — C434 Malignant melanoma of scalp and neck: Secondary | ICD-10-CM | POA: Diagnosis not present

## 2020-09-09 DIAGNOSIS — N184 Chronic kidney disease, stage 4 (severe): Secondary | ICD-10-CM | POA: Diagnosis not present

## 2020-09-09 DIAGNOSIS — Z466 Encounter for fitting and adjustment of urinary device: Secondary | ICD-10-CM | POA: Diagnosis not present

## 2020-09-10 ENCOUNTER — Other Ambulatory Visit: Payer: Self-pay

## 2020-09-10 ENCOUNTER — Ambulatory Visit (INDEPENDENT_AMBULATORY_CARE_PROVIDER_SITE_OTHER): Payer: Medicare Other

## 2020-09-10 ENCOUNTER — Ambulatory Visit (INDEPENDENT_AMBULATORY_CARE_PROVIDER_SITE_OTHER): Payer: Medicare Other | Admitting: Family Medicine

## 2020-09-10 VITALS — BP 97/46 | HR 87 | Ht 69.0 in | Wt 99.6 lb

## 2020-09-10 DIAGNOSIS — Z466 Encounter for fitting and adjustment of urinary device: Secondary | ICD-10-CM | POA: Diagnosis not present

## 2020-09-10 DIAGNOSIS — Z483 Aftercare following surgery for neoplasm: Secondary | ICD-10-CM | POA: Diagnosis not present

## 2020-09-10 DIAGNOSIS — D631 Anemia in chronic kidney disease: Secondary | ICD-10-CM | POA: Diagnosis not present

## 2020-09-10 DIAGNOSIS — I4891 Unspecified atrial fibrillation: Secondary | ICD-10-CM | POA: Diagnosis not present

## 2020-09-10 DIAGNOSIS — C434 Malignant melanoma of scalp and neck: Secondary | ICD-10-CM | POA: Diagnosis not present

## 2020-09-10 DIAGNOSIS — D630 Anemia in neoplastic disease: Secondary | ICD-10-CM | POA: Diagnosis not present

## 2020-09-10 DIAGNOSIS — Z9181 History of falling: Secondary | ICD-10-CM | POA: Diagnosis not present

## 2020-09-10 DIAGNOSIS — N184 Chronic kidney disease, stage 4 (severe): Secondary | ICD-10-CM | POA: Diagnosis not present

## 2020-09-10 DIAGNOSIS — Z978 Presence of other specified devices: Secondary | ICD-10-CM | POA: Diagnosis not present

## 2020-09-10 DIAGNOSIS — Z86010 Personal history of colonic polyps: Secondary | ICD-10-CM | POA: Diagnosis not present

## 2020-09-10 DIAGNOSIS — C19 Malignant neoplasm of rectosigmoid junction: Secondary | ICD-10-CM | POA: Diagnosis not present

## 2020-09-10 DIAGNOSIS — Z87891 Personal history of nicotine dependence: Secondary | ICD-10-CM | POA: Diagnosis not present

## 2020-09-10 DIAGNOSIS — H9190 Unspecified hearing loss, unspecified ear: Secondary | ICD-10-CM | POA: Diagnosis not present

## 2020-09-10 DIAGNOSIS — I2510 Atherosclerotic heart disease of native coronary artery without angina pectoris: Secondary | ICD-10-CM | POA: Diagnosis not present

## 2020-09-10 DIAGNOSIS — Z23 Encounter for immunization: Secondary | ICD-10-CM

## 2020-09-10 DIAGNOSIS — Z7901 Long term (current) use of anticoagulants: Secondary | ICD-10-CM | POA: Diagnosis not present

## 2020-09-10 DIAGNOSIS — I951 Orthostatic hypotension: Secondary | ICD-10-CM | POA: Diagnosis not present

## 2020-09-10 DIAGNOSIS — D509 Iron deficiency anemia, unspecified: Secondary | ICD-10-CM | POA: Diagnosis not present

## 2020-09-10 DIAGNOSIS — Z8673 Personal history of transient ischemic attack (TIA), and cerebral infarction without residual deficits: Secondary | ICD-10-CM | POA: Diagnosis not present

## 2020-09-10 DIAGNOSIS — E43 Unspecified severe protein-calorie malnutrition: Secondary | ICD-10-CM | POA: Diagnosis not present

## 2020-09-10 MED ORDER — MIDODRINE HCL 2.5 MG PO TABS: 2.5000 mg | ORAL_TABLET | Freq: Two times a day (BID) | ORAL | 0 refills | Status: DC

## 2020-09-10 NOTE — Patient Instructions (Addendum)
Blood pressure was 97/46 today.  I still recommend that you wear the compression stockings and make sure to take your midodrine as prescribed.  I have sent in a refill and I want [place] to start taking this medication for now and then follow-up in the next 2 to 4 weeks.  If you start feeling like you are getting dizzy on your feet, are about to fall or pass out, or are becoming very lightheaded when going from sitting to standing did not want you to follow-up with me so we can adjust medications as needed.   I would continue taking the statin medication for your cholesterol given your medical history. If taking medications becomes a significant issue we can re-evaluate your medications and see if we can make any further adjustments.

## 2020-09-10 NOTE — Assessment & Plan Note (Signed)
Asymptomatic.  Work-up at last visit was unremarkable.  BP has improved somewhat, though of note it has been persistently low for some time when measured at home.  Flomax was discontinued and midodrine was started at last visit. - Continue midodrine - Continue compression stockings - Continue hydration and ensure - Strict return precautions were provided - Follow-up in 3 to 4 weeks

## 2020-09-10 NOTE — Progress Notes (Signed)
    SUBJECTIVE:   CHIEF COMPLAINT / HPI:   Orthostatic hypotension Patient history of orthostatic hypotension was seen last week in the office by Dr. Volanda Napoleon.  At that time they decided to discontinue the Flomax and to start taking midodrine twice daily as needed for 14 days.  Patient lives with granddaughter and she has been taking his blood pressures, most recently at home was 102/48.  Does note that he has not had any of his medication this morning and has not have his compression socks on.  He states he does have some issues with swallowing pills but they are able to dissolve the midodrine.  Patient denies any dizziness with standing and feels like he is steadier on his feet than he has been in a while due to working with physical therapy.  He did have some lightheadedness yesterday but does not think it was related to his blood pressure. He has a Foley in place still, urology appointment is [facility] to [facility] will be his [facility] visit.   PERTINENT  PMH / PSH: Reviewed  OBJECTIVE:   BP (!) 97/46   Pulse 87   Ht 5\' 9"  (1.753 m)   Wt 99 lb 9.6 oz (45.2 kg)   SpO2 100%   BMI 14.71 kg/m   General: NAD, well-appearing, well-nourished, Foley in place Respiratory: No respiratory distress, breathing comfortably, able to speak in full sentences Skin: warm and dry, no rashes noted on exposed skin Psych: Appropriate affect and mood  ASSESSMENT/PLAN:   Orthostatic hypotension Asymptomatic.  Work-up at last visit was unremarkable.  BP has improved somewhat, though of note it has been persistently low for some time when measured at home.  Flomax was discontinued and midodrine was started at last visit. - Continue midodrine - Continue compression stockings - Continue hydration and ensure - Strict return precautions were provided - Follow-up in 3 to 4 weeks     Keith [name], [place]

## 2020-09-13 DIAGNOSIS — D631 Anemia in chronic kidney disease: Secondary | ICD-10-CM | POA: Diagnosis not present

## 2020-09-13 DIAGNOSIS — E43 Unspecified severe protein-calorie malnutrition: Secondary | ICD-10-CM | POA: Diagnosis not present

## 2020-09-13 DIAGNOSIS — Z483 Aftercare following surgery for neoplasm: Secondary | ICD-10-CM | POA: Diagnosis not present

## 2020-09-13 DIAGNOSIS — Z86010 Personal history of colonic polyps: Secondary | ICD-10-CM | POA: Diagnosis not present

## 2020-09-13 DIAGNOSIS — Z8673 Personal history of transient ischemic attack (TIA), and cerebral infarction without residual deficits: Secondary | ICD-10-CM | POA: Diagnosis not present

## 2020-09-13 DIAGNOSIS — Z7901 Long term (current) use of anticoagulants: Secondary | ICD-10-CM | POA: Diagnosis not present

## 2020-09-13 DIAGNOSIS — Z87891 Personal history of nicotine dependence: Secondary | ICD-10-CM | POA: Diagnosis not present

## 2020-09-13 DIAGNOSIS — C434 Malignant melanoma of scalp and neck: Secondary | ICD-10-CM | POA: Diagnosis not present

## 2020-09-13 DIAGNOSIS — I2510 Atherosclerotic heart disease of native coronary artery without angina pectoris: Secondary | ICD-10-CM | POA: Diagnosis not present

## 2020-09-13 DIAGNOSIS — N184 Chronic kidney disease, stage 4 (severe): Secondary | ICD-10-CM | POA: Diagnosis not present

## 2020-09-13 DIAGNOSIS — D509 Iron deficiency anemia, unspecified: Secondary | ICD-10-CM | POA: Diagnosis not present

## 2020-09-13 DIAGNOSIS — I4891 Unspecified atrial fibrillation: Secondary | ICD-10-CM | POA: Diagnosis not present

## 2020-09-13 DIAGNOSIS — C19 Malignant neoplasm of rectosigmoid junction: Secondary | ICD-10-CM | POA: Diagnosis not present

## 2020-09-13 DIAGNOSIS — Z9181 History of falling: Secondary | ICD-10-CM | POA: Diagnosis not present

## 2020-09-13 DIAGNOSIS — R338 Other retention of urine: Secondary | ICD-10-CM | POA: Diagnosis not present

## 2020-09-13 DIAGNOSIS — D630 Anemia in neoplastic disease: Secondary | ICD-10-CM | POA: Diagnosis not present

## 2020-09-13 DIAGNOSIS — Z466 Encounter for fitting and adjustment of urinary device: Secondary | ICD-10-CM | POA: Diagnosis not present

## 2020-09-13 DIAGNOSIS — H9190 Unspecified hearing loss, unspecified ear: Secondary | ICD-10-CM | POA: Diagnosis not present

## 2020-09-15 ENCOUNTER — Telehealth: Payer: Self-pay

## 2020-09-15 DIAGNOSIS — Z87891 Personal history of nicotine dependence: Secondary | ICD-10-CM | POA: Diagnosis not present

## 2020-09-15 DIAGNOSIS — D630 Anemia in neoplastic disease: Secondary | ICD-10-CM | POA: Diagnosis not present

## 2020-09-15 DIAGNOSIS — D509 Iron deficiency anemia, unspecified: Secondary | ICD-10-CM | POA: Diagnosis not present

## 2020-09-15 DIAGNOSIS — I2510 Atherosclerotic heart disease of native coronary artery without angina pectoris: Secondary | ICD-10-CM | POA: Diagnosis not present

## 2020-09-15 DIAGNOSIS — Z483 Aftercare following surgery for neoplasm: Secondary | ICD-10-CM | POA: Diagnosis not present

## 2020-09-15 DIAGNOSIS — Z9181 History of falling: Secondary | ICD-10-CM | POA: Diagnosis not present

## 2020-09-15 DIAGNOSIS — C434 Malignant melanoma of scalp and neck: Secondary | ICD-10-CM | POA: Diagnosis not present

## 2020-09-15 DIAGNOSIS — Z86010 Personal history of colonic polyps: Secondary | ICD-10-CM | POA: Diagnosis not present

## 2020-09-15 DIAGNOSIS — I4891 Unspecified atrial fibrillation: Secondary | ICD-10-CM | POA: Diagnosis not present

## 2020-09-15 DIAGNOSIS — Z8673 Personal history of transient ischemic attack (TIA), and cerebral infarction without residual deficits: Secondary | ICD-10-CM | POA: Diagnosis not present

## 2020-09-15 DIAGNOSIS — H9190 Unspecified hearing loss, unspecified ear: Secondary | ICD-10-CM | POA: Diagnosis not present

## 2020-09-15 DIAGNOSIS — D631 Anemia in chronic kidney disease: Secondary | ICD-10-CM | POA: Diagnosis not present

## 2020-09-15 DIAGNOSIS — Z7901 Long term (current) use of anticoagulants: Secondary | ICD-10-CM | POA: Diagnosis not present

## 2020-09-15 DIAGNOSIS — Z466 Encounter for fitting and adjustment of urinary device: Secondary | ICD-10-CM | POA: Diagnosis not present

## 2020-09-15 DIAGNOSIS — C19 Malignant neoplasm of rectosigmoid junction: Secondary | ICD-10-CM | POA: Diagnosis not present

## 2020-09-15 DIAGNOSIS — N184 Chronic kidney disease, stage 4 (severe): Secondary | ICD-10-CM | POA: Diagnosis not present

## 2020-09-15 DIAGNOSIS — E43 Unspecified severe protein-calorie malnutrition: Secondary | ICD-10-CM | POA: Diagnosis not present

## 2020-09-15 NOTE — Telephone Encounter (Signed)
Received phone call from patient's home health nurse regarding low BP reading. Reports that BP at visit today was 98/52. Patient asymptomatic. RN reports that patient had not yet taken midodrine, and reports he will take medication after visit.   FYI to PCP.   Talbot Grumbling, RN

## 2020-09-15 NOTE — Telephone Encounter (Signed)
Jim from [facility] calling for PT verbal orders as follows:  1 time(s) weekly for 4 week(s)  Verbal orders given per [facility] protocol.   Clair Gulling also reports that BP was low at visit, with notable orthostatic hypotension. Reports that sitting BP was 97/84, standing systolic 60. Patient currently asymptomatic.   Of note orthostatic BP at visit on 8/18 was laying 113/55 P 81; sitting 113/44 P 82; standing 72/40, 81/43  P 95  Precepted with Dr. Nori Riis. No new orders at this time. Will forward to PCP. Patient aware of reasons to return to care or seek care in ED.    Talbot Grumbling, RN

## 2020-09-16 ENCOUNTER — Telehealth: Payer: Self-pay

## 2020-09-16 ENCOUNTER — Other Ambulatory Visit: Payer: Self-pay | Admitting: Family Medicine

## 2020-09-16 DIAGNOSIS — R1310 Dysphagia, unspecified: Secondary | ICD-10-CM

## 2020-09-16 MED ORDER — MIDODRINE HCL 2.5 MG PO TABS: 2.5000 mg | ORAL_TABLET | Freq: Two times a day (BID) | ORAL | 0 refills | Status: DC

## 2020-09-16 NOTE — Telephone Encounter (Signed)
Orders faxed to provided number.   Talbot Grumbling, RN

## 2020-09-16 NOTE — Telephone Encounter (Signed)
Deidre, speech therapist with [facility] home health calls nurse line requesting orders for a modified barium swallow study. Patient has been having continued difficulty with swallowing.   Once order is placed, please route back to RN team. Order will need to be faxed to [facility] at [phone].   Talbot Grumbling, RN

## 2020-09-17 ENCOUNTER — Other Ambulatory Visit (HOSPITAL_COMMUNITY): Payer: Self-pay

## 2020-09-17 DIAGNOSIS — R1310 Dysphagia, unspecified: Secondary | ICD-10-CM

## 2020-09-20 DIAGNOSIS — D630 Anemia in neoplastic disease: Secondary | ICD-10-CM | POA: Diagnosis not present

## 2020-09-20 DIAGNOSIS — C19 Malignant neoplasm of rectosigmoid junction: Secondary | ICD-10-CM | POA: Diagnosis not present

## 2020-09-20 DIAGNOSIS — C434 Malignant melanoma of scalp and neck: Secondary | ICD-10-CM | POA: Diagnosis not present

## 2020-09-20 DIAGNOSIS — Z86010 Personal history of colonic polyps: Secondary | ICD-10-CM | POA: Diagnosis not present

## 2020-09-20 DIAGNOSIS — Z9181 History of falling: Secondary | ICD-10-CM | POA: Diagnosis not present

## 2020-09-20 DIAGNOSIS — Z87891 Personal history of nicotine dependence: Secondary | ICD-10-CM | POA: Diagnosis not present

## 2020-09-20 DIAGNOSIS — I4891 Unspecified atrial fibrillation: Secondary | ICD-10-CM | POA: Diagnosis not present

## 2020-09-20 DIAGNOSIS — D631 Anemia in chronic kidney disease: Secondary | ICD-10-CM | POA: Diagnosis not present

## 2020-09-20 DIAGNOSIS — N184 Chronic kidney disease, stage 4 (severe): Secondary | ICD-10-CM | POA: Diagnosis not present

## 2020-09-20 DIAGNOSIS — Z8673 Personal history of transient ischemic attack (TIA), and cerebral infarction without residual deficits: Secondary | ICD-10-CM | POA: Diagnosis not present

## 2020-09-20 DIAGNOSIS — H9190 Unspecified hearing loss, unspecified ear: Secondary | ICD-10-CM | POA: Diagnosis not present

## 2020-09-20 DIAGNOSIS — E43 Unspecified severe protein-calorie malnutrition: Secondary | ICD-10-CM | POA: Diagnosis not present

## 2020-09-20 DIAGNOSIS — Z466 Encounter for fitting and adjustment of urinary device: Secondary | ICD-10-CM | POA: Diagnosis not present

## 2020-09-20 DIAGNOSIS — I2510 Atherosclerotic heart disease of native coronary artery without angina pectoris: Secondary | ICD-10-CM | POA: Diagnosis not present

## 2020-09-20 DIAGNOSIS — Z483 Aftercare following surgery for neoplasm: Secondary | ICD-10-CM | POA: Diagnosis not present

## 2020-09-20 DIAGNOSIS — D509 Iron deficiency anemia, unspecified: Secondary | ICD-10-CM | POA: Diagnosis not present

## 2020-09-20 DIAGNOSIS — Z7901 Long term (current) use of anticoagulants: Secondary | ICD-10-CM | POA: Diagnosis not present

## 2020-09-21 ENCOUNTER — Telehealth: Payer: Self-pay

## 2020-09-21 ENCOUNTER — Encounter: Payer: Self-pay | Admitting: Family Medicine

## 2020-09-21 DIAGNOSIS — Z483 Aftercare following surgery for neoplasm: Secondary | ICD-10-CM | POA: Diagnosis not present

## 2020-09-21 DIAGNOSIS — Z9181 History of falling: Secondary | ICD-10-CM | POA: Diagnosis not present

## 2020-09-21 DIAGNOSIS — Z7901 Long term (current) use of anticoagulants: Secondary | ICD-10-CM | POA: Diagnosis not present

## 2020-09-21 DIAGNOSIS — C19 Malignant neoplasm of rectosigmoid junction: Secondary | ICD-10-CM | POA: Diagnosis not present

## 2020-09-21 DIAGNOSIS — D509 Iron deficiency anemia, unspecified: Secondary | ICD-10-CM | POA: Diagnosis not present

## 2020-09-21 DIAGNOSIS — D631 Anemia in chronic kidney disease: Secondary | ICD-10-CM | POA: Diagnosis not present

## 2020-09-21 DIAGNOSIS — H9190 Unspecified hearing loss, unspecified ear: Secondary | ICD-10-CM | POA: Diagnosis not present

## 2020-09-21 DIAGNOSIS — I2510 Atherosclerotic heart disease of native coronary artery without angina pectoris: Secondary | ICD-10-CM | POA: Diagnosis not present

## 2020-09-21 DIAGNOSIS — N184 Chronic kidney disease, stage 4 (severe): Secondary | ICD-10-CM | POA: Diagnosis not present

## 2020-09-21 DIAGNOSIS — Z8673 Personal history of transient ischemic attack (TIA), and cerebral infarction without residual deficits: Secondary | ICD-10-CM | POA: Diagnosis not present

## 2020-09-21 DIAGNOSIS — E43 Unspecified severe protein-calorie malnutrition: Secondary | ICD-10-CM | POA: Diagnosis not present

## 2020-09-21 DIAGNOSIS — Z87891 Personal history of nicotine dependence: Secondary | ICD-10-CM | POA: Diagnosis not present

## 2020-09-21 DIAGNOSIS — I4891 Unspecified atrial fibrillation: Secondary | ICD-10-CM | POA: Diagnosis not present

## 2020-09-21 DIAGNOSIS — D630 Anemia in neoplastic disease: Secondary | ICD-10-CM | POA: Diagnosis not present

## 2020-09-21 DIAGNOSIS — Z466 Encounter for fitting and adjustment of urinary device: Secondary | ICD-10-CM | POA: Diagnosis not present

## 2020-09-21 DIAGNOSIS — C434 Malignant melanoma of scalp and neck: Secondary | ICD-10-CM | POA: Diagnosis not present

## 2020-09-21 DIAGNOSIS — Z86010 Personal history of colonic polyps: Secondary | ICD-10-CM | POA: Diagnosis not present

## 2020-09-21 NOTE — Telephone Encounter (Signed)
Keith [name], PT with [facility] calls nurse line regarding intermittent tremors in right arm and leg. Reports this has been going on for approx. 2 weeks. Denies neurological symptoms. Patient is alert and oriented.   We do not have any appointments until the end of next week. Patient has follow up with PCP on 9/20.  UC/ED precautions given.   Please advise additional recommendations.   Talbot Grumbling, RN

## 2020-09-22 ENCOUNTER — Inpatient Hospital Stay (HOSPITAL_COMMUNITY): Payer: Medicare Other

## 2020-09-22 ENCOUNTER — Emergency Department (HOSPITAL_COMMUNITY): Payer: Medicare Other

## 2020-09-22 ENCOUNTER — Inpatient Hospital Stay (HOSPITAL_COMMUNITY)
Admission: EM | Admit: 2020-09-22 | Discharge: 2020-09-26 | DRG: 378 | Disposition: A | Discharge status: Home Health | Payer: Medicare Other | Attending: Family Medicine | Admitting: Family Medicine

## 2020-09-22 ENCOUNTER — Encounter: Payer: Self-pay | Admitting: Family Medicine

## 2020-09-22 ENCOUNTER — Other Ambulatory Visit: Payer: Self-pay

## 2020-09-22 DIAGNOSIS — K319 Disease of stomach and duodenum, unspecified: Secondary | ICD-10-CM | POA: Diagnosis present

## 2020-09-22 DIAGNOSIS — D649 Anemia, unspecified: Secondary | ICD-10-CM | POA: Diagnosis not present

## 2020-09-22 DIAGNOSIS — I480 Paroxysmal atrial fibrillation: Secondary | ICD-10-CM | POA: Diagnosis not present

## 2020-09-22 DIAGNOSIS — Z419 Encounter for procedure for purposes other than remedying health state, unspecified: Secondary | ICD-10-CM

## 2020-09-22 DIAGNOSIS — Z66 Do not resuscitate: Secondary | ICD-10-CM | POA: Diagnosis present

## 2020-09-22 DIAGNOSIS — Z880 Allergy status to penicillin: Secondary | ICD-10-CM | POA: Diagnosis not present

## 2020-09-22 DIAGNOSIS — G9389 Other specified disorders of brain: Secondary | ICD-10-CM | POA: Diagnosis not present

## 2020-09-22 DIAGNOSIS — D500 Iron deficiency anemia secondary to blood loss (chronic): Secondary | ICD-10-CM | POA: Diagnosis not present

## 2020-09-22 DIAGNOSIS — K222 Esophageal obstruction: Secondary | ICD-10-CM | POA: Diagnosis present

## 2020-09-22 DIAGNOSIS — E559 Vitamin D deficiency, unspecified: Secondary | ICD-10-CM | POA: Diagnosis not present

## 2020-09-22 DIAGNOSIS — Z79899 Other long term (current) drug therapy: Secondary | ICD-10-CM | POA: Diagnosis not present

## 2020-09-22 DIAGNOSIS — N184 Chronic kidney disease, stage 4 (severe): Secondary | ICD-10-CM | POA: Diagnosis not present

## 2020-09-22 DIAGNOSIS — I700 Atherosclerosis of aorta: Secondary | ICD-10-CM | POA: Diagnosis not present

## 2020-09-22 DIAGNOSIS — Z8673 Personal history of transient ischemic attack (TIA), and cerebral infarction without residual deficits: Secondary | ICD-10-CM

## 2020-09-22 DIAGNOSIS — D125 Benign neoplasm of sigmoid colon: Secondary | ICD-10-CM | POA: Diagnosis not present

## 2020-09-22 DIAGNOSIS — Z681 Body mass index (BMI) 19 or less, adult: Secondary | ICD-10-CM

## 2020-09-22 DIAGNOSIS — K219 Gastro-esophageal reflux disease without esophagitis: Secondary | ICD-10-CM | POA: Diagnosis present

## 2020-09-22 DIAGNOSIS — D509 Iron deficiency anemia, unspecified: Secondary | ICD-10-CM | POA: Diagnosis not present

## 2020-09-22 DIAGNOSIS — K449 Diaphragmatic hernia without obstruction or gangrene: Secondary | ICD-10-CM | POA: Diagnosis not present

## 2020-09-22 DIAGNOSIS — R188 Other ascites: Secondary | ICD-10-CM | POA: Diagnosis not present

## 2020-09-22 DIAGNOSIS — R6889 Other general symptoms and signs: Secondary | ICD-10-CM | POA: Diagnosis not present

## 2020-09-22 DIAGNOSIS — Z85820 Personal history of malignant melanoma of skin: Secondary | ICD-10-CM | POA: Diagnosis not present

## 2020-09-22 DIAGNOSIS — R1310 Dysphagia, unspecified: Secondary | ICD-10-CM | POA: Diagnosis not present

## 2020-09-22 DIAGNOSIS — D631 Anemia in chronic kidney disease: Secondary | ICD-10-CM | POA: Diagnosis not present

## 2020-09-22 DIAGNOSIS — E785 Hyperlipidemia, unspecified: Secondary | ICD-10-CM | POA: Diagnosis not present

## 2020-09-22 DIAGNOSIS — K648 Other hemorrhoids: Secondary | ICD-10-CM | POA: Diagnosis not present

## 2020-09-22 DIAGNOSIS — D62 Acute posthemorrhagic anemia: Secondary | ICD-10-CM | POA: Diagnosis not present

## 2020-09-22 DIAGNOSIS — Z20822 Contact with and (suspected) exposure to covid-19: Secondary | ICD-10-CM | POA: Diagnosis present

## 2020-09-22 DIAGNOSIS — Z7901 Long term (current) use of anticoagulants: Secondary | ICD-10-CM | POA: Diagnosis not present

## 2020-09-22 DIAGNOSIS — G378 Other specified demyelinating diseases of central nervous system: Secondary | ICD-10-CM | POA: Diagnosis present

## 2020-09-22 DIAGNOSIS — R636 Underweight: Secondary | ICD-10-CM | POA: Diagnosis present

## 2020-09-22 DIAGNOSIS — Z87891 Personal history of nicotine dependence: Secondary | ICD-10-CM

## 2020-09-22 DIAGNOSIS — Z833 Family history of diabetes mellitus: Secondary | ICD-10-CM | POA: Diagnosis not present

## 2020-09-22 DIAGNOSIS — K2951 Unspecified chronic gastritis with bleeding: Secondary | ICD-10-CM | POA: Diagnosis not present

## 2020-09-22 DIAGNOSIS — Z743 Need for continuous supervision: Secondary | ICD-10-CM | POA: Diagnosis not present

## 2020-09-22 DIAGNOSIS — Z980 Intestinal bypass and anastomosis status: Secondary | ICD-10-CM | POA: Diagnosis not present

## 2020-09-22 DIAGNOSIS — R531 Weakness: Secondary | ICD-10-CM | POA: Diagnosis not present

## 2020-09-22 DIAGNOSIS — R253 Fasciculation: Secondary | ICD-10-CM | POA: Diagnosis present

## 2020-09-22 DIAGNOSIS — K5731 Diverticulosis of large intestine without perforation or abscess with bleeding: Secondary | ICD-10-CM | POA: Diagnosis present

## 2020-09-22 DIAGNOSIS — I959 Hypotension, unspecified: Secondary | ICD-10-CM | POA: Diagnosis not present

## 2020-09-22 DIAGNOSIS — Z85038 Personal history of other malignant neoplasm of large intestine: Secondary | ICD-10-CM

## 2020-09-22 DIAGNOSIS — G319 Degenerative disease of nervous system, unspecified: Secondary | ICD-10-CM | POA: Diagnosis not present

## 2020-09-22 DIAGNOSIS — K6389 Other specified diseases of intestine: Secondary | ICD-10-CM | POA: Diagnosis not present

## 2020-09-22 DIAGNOSIS — K5730 Diverticulosis of large intestine without perforation or abscess without bleeding: Secondary | ICD-10-CM | POA: Diagnosis not present

## 2020-09-22 DIAGNOSIS — K6289 Other specified diseases of anus and rectum: Secondary | ICD-10-CM | POA: Diagnosis not present

## 2020-09-22 DIAGNOSIS — K635 Polyp of colon: Secondary | ICD-10-CM | POA: Diagnosis not present

## 2020-09-22 DIAGNOSIS — K3189 Other diseases of stomach and duodenum: Secondary | ICD-10-CM | POA: Diagnosis not present

## 2020-09-22 DIAGNOSIS — R9082 White matter disease, unspecified: Secondary | ICD-10-CM | POA: Diagnosis not present

## 2020-09-22 DIAGNOSIS — R479 Unspecified speech disturbances: Secondary | ICD-10-CM

## 2020-09-22 DIAGNOSIS — R9431 Abnormal electrocardiogram [ECG] [EKG]: Secondary | ICD-10-CM | POA: Diagnosis not present

## 2020-09-22 HISTORY — DX: Acute posthemorrhagic anemia: D62

## 2020-09-22 LAB — CBC WITH DIFFERENTIAL/PLATELET
Abs Immature Granulocytes: 0.05 10*3/uL (ref 0.00–0.07)
Basophils Absolute: 0 10*3/uL (ref 0.0–0.1)
Basophils Relative: 0 %
Eosinophils Absolute: 0.2 10*3/uL (ref 0.0–0.5)
Eosinophils Relative: 3 %
HCT: 18.9 % — ABNORMAL LOW (ref 39.0–52.0)
Hemoglobin: 5.5 g/dL — CL (ref 13.0–17.0)
Immature Granulocytes: 1 %
Lymphocytes Relative: 24 %
Lymphs Abs: 1.9 10*3/uL (ref 0.7–4.0)
MCH: 28.6 pg (ref 26.0–34.0)
MCHC: 29.1 g/dL — ABNORMAL LOW (ref 30.0–36.0)
MCV: 98.4 fL (ref 80.0–100.0)
Monocytes Absolute: 0.8 10*3/uL (ref 0.1–1.0)
Monocytes Relative: 10 %
Neutro Abs: 4.9 10*3/uL (ref 1.7–7.7)
Neutrophils Relative %: 62 %
Platelets: 354 10*3/uL (ref 150–400)
RBC: 1.92 MIL/uL — ABNORMAL LOW (ref 4.22–5.81)
RDW: 16.1 % — ABNORMAL HIGH (ref 11.5–15.5)
WBC: 7.8 10*3/uL (ref 4.0–10.5)
nRBC: 0 % (ref 0.0–0.2)

## 2020-09-22 LAB — COMPREHENSIVE METABOLIC PANEL
ALT: 9 U/L (ref 0–44)
AST: 15 U/L (ref 15–41)
Albumin: 2.7 g/dL — ABNORMAL LOW (ref 3.5–5.0)
Alkaline Phosphatase: 62 U/L (ref 38–126)
Anion gap: 8 (ref 5–15)
BUN: 34 mg/dL — ABNORMAL HIGH (ref 8–23)
CO2: 23 mmol/L (ref 22–32)
Calcium: 9.1 mg/dL (ref 8.9–10.3)
Chloride: 103 mmol/L (ref 98–111)
Creatinine, Ser: 2.17 mg/dL — ABNORMAL HIGH (ref 0.61–1.24)
GFR, Estimated: 30 mL/min — ABNORMAL LOW (ref >60)
Glucose, Bld: 110 mg/dL — ABNORMAL HIGH (ref 70–99)
Potassium: 4.9 mmol/L (ref 3.5–5.1)
Sodium: 134 mmol/L — ABNORMAL LOW (ref 135–145)
Total Bilirubin: 0.3 mg/dL (ref 0.3–1.2)
Total Protein: 6 g/dL — ABNORMAL LOW (ref 6.5–8.1)

## 2020-09-22 LAB — URINALYSIS, ROUTINE W REFLEX MICROSCOPIC
Bilirubin Urine: NEGATIVE
Glucose, UA: NEGATIVE mg/dL
Hgb urine dipstick: NEGATIVE
Ketones, ur: NEGATIVE mg/dL
Nitrite: POSITIVE — AB
Protein, ur: NEGATIVE mg/dL
Specific Gravity, Urine: 1.008 (ref 1.005–1.030)
WBC, UA: >50 WBC/hpf — ABNORMAL HIGH (ref 0–5)
pH: 7 (ref 5.0–8.0)

## 2020-09-22 LAB — PROTIME-INR
INR: 1.1 (ref 0.8–1.2)
Prothrombin Time: 14.4 seconds (ref 11.4–15.2)

## 2020-09-22 LAB — PREPARE RBC (CROSSMATCH)

## 2020-09-22 LAB — APTT: aPTT: 32 seconds (ref 24–36)

## 2020-09-22 LAB — CBG MONITORING, ED: Glucose-Capillary: 93 mg/dL (ref 70–99)

## 2020-09-22 LAB — POC OCCULT BLOOD, ED: Fecal Occult Bld: POSITIVE — AB

## 2020-09-22 LAB — MAGNESIUM: Magnesium: 2.5 mg/dL — ABNORMAL HIGH (ref 1.7–2.4)

## 2020-09-22 MED ORDER — SODIUM CHLORIDE 0.9 % IV SOLN
10.0000 mL/h | Freq: Once | INTRAVENOUS | Status: AC
  Administered 2020-09-22: 10 mL/h via INTRAVENOUS

## 2020-09-22 MED ORDER — SODIUM CHLORIDE 0.9 % IV BOLUS
500.0000 mL | Freq: Once | INTRAVENOUS | Status: AC
  Administered 2020-09-22: 500 mL via INTRAVENOUS

## 2020-09-22 MED ORDER — ATORVASTATIN CALCIUM 40 MG PO TABS
40.0000 mg | ORAL_TABLET | Freq: Every day | ORAL | Status: DC
  Administered 2020-09-22 – 2020-09-25 (×4): 40 mg via ORAL
  Filled 2020-09-22 (×5): qty 1

## 2020-09-22 MED ORDER — ACETAMINOPHEN 650 MG RE SUPP: 650.0000 mg | Freq: Four times a day (QID) | RECTAL | Status: DC | PRN

## 2020-09-22 MED ORDER — PANTOPRAZOLE SODIUM 40 MG IV SOLR: 40.0000 mg | INTRAVENOUS | Status: DC

## 2020-09-22 MED ORDER — PANTOPRAZOLE SODIUM 40 MG IV SOLR
40.0000 mg | Freq: Two times a day (BID) | INTRAVENOUS | Status: DC
  Administered 2020-09-23 – 2020-09-25 (×5): 40 mg via INTRAVENOUS
  Filled 2020-09-22 (×5): qty 40

## 2020-09-22 MED ORDER — ACETAMINOPHEN 325 MG PO TABS: 650.0000 mg | ORAL_TABLET | Freq: Four times a day (QID) | ORAL | Status: DC | PRN

## 2020-09-22 MED ORDER — PROTHROMBIN COMPLEX CONC HUMAN 500 UNITS IV KIT
2252.0000 [IU] | PACK | Status: AC
  Administered 2020-09-22: 2252 [IU] via INTRAVENOUS
  Filled 2020-09-22: qty 2252

## 2020-09-22 NOTE — H&P (View-Only) (Signed)
[facility] Consult: 10:04 AM 09/22/2020  LOS: 0 days    Referring Provider: [place] Crane in ED  Primary Care Physician:  Rise Patience, DO Primary Gastroenterologist:  Dr. Arelia Sneddon     Reason for Consultation: Anemia.   HPI: Keith [name] is a 80 y.o. male.  [place] 2017 CVA with complete resolution of right hemiweakness/paresis.  On chronic Eliquis.  CKD with GFR anywhere from the low 20s to mid 30s C/W stage 3b to stage 4 CKD.  Iron deficiency dates back to at least 2020.  Low iron 34, low iron sats, ferritin 24 on 06/24/2020; takes iron once daily.  No records of parenteral iron or CSA agent (epo) administration takes famotidine 20 mg bid.    04/2019 EGD for GI bleed.  4 cm HH, otherwise normal study.  Stomach biopsies obtained from normal-appearing mucosa.  Pathology from stomach showed mild, chronic inflammation, no H. pylori.  Duodenal pathology benign. 04/2019 colonoscopy.  For rectal bleeding.  Malignant looking tumor at distal transverse colon biopsied and tattooed.  4 polyps resected.  These measured anywhere from 4 to 12 mm.  The largest, 12 mm polypectomy site in the sigmoid was tattooed.  Mild sigmoid tics.  Normal examined TI.  Nonbleeding, small internal hemorrhoids.  Pathology confirmed adenocarcinoma in the transverse colon mass and sigmoid polyp showed carcinoma involved in tubular adenoma.  Other polyp in the a sending colon was a TA without HGD.  Not clear why it was delayed but pt did not undergo surgery until 3/53/2992 Dr Leighton Ruff performed robot assisted right colectomy.  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  Tumor invading muscularis and into pericolonic tissue.  Resection margins negative.  Within the resected did colon were also to tubular adenomas.   Discharged after 6-day admission on 7/27.  Discharge diagnosis included severe protein calorie malnutrition.  Patient reports that when he had his stroke in 2017 he weighed 130#, and dropped down to 106# prior to surgery.  Charge summary mentions limited diarrhea which responded to Imodium.  Patient had difficulty voiding and was discharged with chronic indwelling Foley catheter. Readmission 7/29 -08/19/2020 with SBO, syncope.  This improved with medical management.  Foley catheter in place at discharge.  Patient presents back to the ED at 430 this morning.  For a few days experiencing tremors in his arms and legs.  At one point his legs gave out from under him.  This was more noticeable yesterday so he asked his family to wake him up in the early morning to confirm he was doing okay, which they did.  At that point his speech was mumbled.  There was concern he was having a stroke and he was brought to the ED.  Patient says his appetites been good.  He does suffer from heartburn which he treats with Tums infrequently.  Having dysphagia to solid foods, rice, breads, pills and has a scheduled swallowing evaluation next week.  Dysphagia experienced at the lower esophagus.  At times he has had to regurgitate to relieve the dysphagia.  Also says his stools have been dark but not burgundy or melenic for a week or so.  Denies abdominal pain, swelling.  Hgb 5.5.  Baseline in July, early August was anywhere from ~ 8.1 to 10.4.  MCV is 98.  FOBT positive.  INR 1.1.  BUN and creatinine elevated from baseline at 34/2.1.   Protonix 40 mg IV every 24 hours initiated.  Denies family history of colorectal cancer, anemia, blood transfusions, ulcer disease.    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   Melanoma ([place])    scalp   Melanoma of scalp ([place]) 04/23/2017   Obstructive uropathy    PONV (postoperative nausea and vomiting) 03/20/2017   [place]roke ([place] 11/2016   speech and right side at time of stroke 04/20/17- all function return to normal   Underweight 01/24/2018    Past Surgical History:  Procedure Laterality Date   APPLICATION OF A-CELL OF HEAD/NECK N/A 04/23/2017   Procedure: APPLICATION OF INTREGRA;  Surgeon: Irene Limbo, MD;  Location: [place];  Service: Plastics;  Laterality: N/A;   BIOPSY  05/10/2019   Procedure: BIOPSY;  Surgeon: [name], MD;  Location: [place];  Service: Endoscopy;;   CATARACT EXTRACTION     COLONOSCOPY WITH PROPOFOL N/A 05/10/2019   Procedure: COLONOSCOPY WITH PROPOFOL;  Surgeon: [name], MD;  Location: [facility] ENDOSCOPY;  Service: Endoscopy;  Laterality: N/A;   ESOPHAGOGASTRODUODENOSCOPY (EGD) WITH PROPOFOL N/A 05/10/2019   Procedure: ESOPHAGOGASTRODUODENOSCOPY (EGD) WITH PROPOFOL;  Surgeon: [name], MD;  Location: [facility] ENDOSCOPY;  Service: Endoscopy;  Laterality: N/A;   EXCISION MASS HEAD N/A 03/20/2017   Procedure: EXCISION scalp lesion;  Surgeon: Irene Limbo, MD;  Location: [place];  Service: Plastics;  Laterality: N/A;  EXCISION scalp lesion   EYE SURGERY     IR ANGIO INTRA EXTRACRAN SEL COM CAROTID INNOMINATE BILAT MOD SED  11/23/2016   IR ANGIO VERTEBRAL SEL VERTEBRAL UNI L MOD SED  11/23/2016   LOOP RECORDER INSERTION N/A 11/23/2016   Procedure: LOOP RECORDER INSERTION;  Surgeon: [name], MD;  Location: [place] CV LAB;  Service: Cardiovascular;  Laterality: N/A;   MASS EXCISION N/A 04/23/2017   Procedure: EXCISION MALIGNANT LESION OF SCALP;  Surgeon: Irene Limbo, MD;  Location: [place];  Service:  Plastics;  Laterality: N/A;   MELANOMA EXCISION  04/23/2017   BILATERAL SUPERFICIAL PAROTIDECTOMY/NECK SENTINEL NODE BIOPSY WITH FROZEN SECTION; LEFT SELECTIVE NECK DISSECTION   PAROTIDECTOMY Bilateral 04/23/2017   Procedure: BILATERAL SUPERFICIAL PAROTIDECTOMY/NECK SENTINEL NODE BIOPSY WITH FROZEN SECTION; LEFT SELECTIVE NECK DISSECTION;  Surgeon: [name], MD;  Location: [place];  Service: ENT;  Laterality: Bilateral;   POLYPECTOMY  05/10/2019   Procedure: POLYPECTOMY;  Surgeon: [name], MD;  Location: [place];  Service: Endoscopy;;   SKIN GRAFT SPLIT THICKNESS HEAD / NECK  06/04/2017   SKIN GRAFT SPLIT THICKNESS TO SCALP FROM RIGHT OR LEFT THIGH POSSIBLE A CELL TO DONOR SITE   SKIN SPLIT GRAFT N/A 06/04/2017   Procedure:  SKIN GRAFT SPLIT THICKNESS TO SCALP FROM RIGHT OR LEFT THIGH POSSIBLE A CELL TO DONOR SITE;  Surgeon: Irene Limbo, MD;  Location: [place];  Service: Plastics;  Laterality: N/A;   SUBMUCOSAL TATTOO INJECTION  05/10/2019   Procedure: SUBMUCOSAL TATTOO INJECTION;  Surgeon: [name], MD;  Location: [facility] ENDOSCOPY;  Service: Endoscopy;;   THYROIDECTOMY     TONSILLECTOMY      Prior to Admission medications   Medication Sig Start Date End Date Taking? Authorizing Provider  apixaban (ELIQUIS) 2.5 MG TABS tablet Take 1 tablet (2.5 mg total) by mouth 2 (two) times daily. 04/26/20  Yes Shirley Friar, PA-C  atorvastatin (LIPITOR) 40 MG tablet TAKE 1 TABLET (40 MG TOTAL) BY MOUTH DAILY AT 6 PM. 03/28/19  Yes Bonnita Hollow, MD  cholecalciferol (VITAMIN D3) 25 MCG (1000 UNIT) tablet Take 1,000 Units by mouth daily.   Yes [provider]  Dapsone 5 % topical gel Apply 1 application topically 2 (two) times a week. 02/08/18  Yes [provider]  Ensure Plus (ENSURE PLUS) LIQD Take 237 mLs by mouth 2 (two) times daily between meals. Choc or Vanilla   Yes [provider]  famotidine (PEPCID) 20 MG tablet Take 1 tablet (20 mg total) by mouth  2 (two) times daily. Patient taking differently: Take 20 mg by mouth daily. 08/23/20  Yes [name], [name], DO  ferrous sulfate 325 (65 FE) MG tablet Take 325 mg by mouth daily.    Yes [provider]  midodrine (PROAMATINE) 2.5 MG tablet Take 1 tablet (2.5 mg total) by mouth 2 (two) times daily with a meal. 09/16/20  Yes [name], [name], DO  Multiple Vitamin (MULTIVITAMIN) capsule Take 1 capsule by mouth daily.    Yes [provider]  timolol (TIMOPTIC) 0.5 % ophthalmic solution Place 1 drop into both eyes 2 (two) times daily.  11/24/17  Yes [provider]  acetaminophen (TYLENOL) 500 MG tablet Take 2 tablets  by mouth every 6  hours. Patient not taking: No sig reported 2/35/57   Leighton Ruff, MD    Scheduled Meds:  Infusions:  PRN Meds:    Allergies as of 09/22/2020 - Review Complete 09/22/2020  Allergen Reaction Noted   Penicillins Other (See Comments) 06/24/2014    Family History  Problem Relation Age of Onset   Lung cancer Father    Lung cancer Mother    Diabetes Brother    Colon cancer Neg Hx    Colon polyps Neg Hx    Esophageal cancer Neg Hx    Stomach cancer Neg Hx    Pancreatic cancer Neg Hx     Social History   Socioeconomic History   Marital status: Widowed    Spouse name: Not on file   Number of children: 1   Years of education: 71   Highest education level: 12th grade  Occupational History   Occupation: laborer    Comment: retired  Tobacco Use   Smoking status: Former    Packs/day: 2.00    Years: 32.00    Pack years: 64.00    Types: Cigarettes    Quit date: 1983    Years since quitting: 39.7   Smokeless tobacco: Never   Tobacco comments:    No plans to start  Vaping Use   Vaping Use: Never used  Substance and Sexual Activity   Alcohol use: No   Drug use: No   Sexual activity: Not Currently  Other Topics Concern   Not on  file  Social History Narrative   Patient lives alone in [place].    Patient has one son and a  daughter in law, [name]. Patient is close with them.    Patient has a dog named "boo boo bear." Great joy for him.   Patient enjoys spending time with his family and walking his dog.          Social Determinants of Health   Financial Resource Strain: Not on file  Food Insecurity: Not on file  Transportation Needs: Not on file  Physical Activity: Not on file  Stress: Not on file  Social Connections: Not on file  Intimate Partner Violence: Not on file    REVIEW OF SYSTEMS: Constitutional: Weakness. ENT:  No nose bleeds Pulm: Productive cough at times but no dyspnea. CV:  No palpitations, no LE edema.  No angina. GU:  No hematuria, no frequency.  Foley catheter for urinary retention as per HPI. GI: See HPI. Heme: Denies excessive or unusual bleeding or bruising. Transfusions: Recalls transfusions PRBC back in 2005. Neuro: Limb tremors as per HPI.  No seizures.  No recurrent syncope for more than 1 month. Derm: Locations on scalp where skin cancers had been removed have never successfully healed. Endocrine:  No sweats or chills.  No polyuria or dysuria Immunization: Reviewed.  He is received 4 separate Pfizer COVID-19 vaccines Travel:  None beyond local counties in last few months.    PHYSICAL EXAM: Vital signs in last 24 hours: Vitals:   09/22/20 0915 09/22/20 0930  BP: (!) 104/48 113/65  Pulse: (!) 59 78  Resp: 12 (!) 31  Temp:    SpO2: 100% 97%   Wt Readings from Last 3 Encounters:  09/22/20 44.5 kg  09/10/20 45.2 kg  09/02/20 44.9 kg    General: Thin, pale, somewhat unwell but comfortable and alert. Head: No facial asymmetry or swelling.  No signs of head trauma.  A bandage covers nonhealing sores on his scalp. Eyes: No scleral icterus.  No conjunctival pallor.  EOMI. Ears: Not hard of hearing Nose: No congestion or discharge Mouth: Edentulous.  Tongue midline.  Mucosa is moist, pink, clear. Neck: No thyromegaly, no masses, no JVD. Lungs: Slightly diminished but  clear.  No labored breathing or cough. Heart: RRR.  No MRG.  S1, S2 present. Abdomen: Soft without tenderness.  No HSM, masses, bruits, hernias.  Well-healed, 2 to 3 inch horizontal, surgical incision at pubic area.   Rectal: Deferred Musc/Skeltl: No joint redness, swelling or gross deformity. Extremities: No CCE. Neurologic: No tremors.  No limb weakness.  No facial asymmetry.  Moves all 4 limbs.  Fluid speech.  Good historian. Skin: No rash.  Sore on the top of the scalp as above. Tattoos: None observed. Nodes: No cervical adenopathy Psych: Pleasant, cooperative, calm.  Intake/Output from previous day: No intake/output data recorded. Intake/Output this shift: No intake/output data recorded.  LAB RESULTS: Recent Labs    09/22/20 0433  WBC 7.8  HGB 5.5*  HCT 18.9*  PLT 354   BMET Lab Results  Component Value Date   NA 134 (L) 09/22/2020   NA 138 09/02/2020   NA 142 08/16/2020   K 4.9 09/22/2020   K 4.5 09/03/2020   K 5.8 (H) 09/02/2020   CL 103 09/22/2020   CL 103 09/02/2020   CL 101 08/16/2020   CO2 23 09/22/2020   CO2 25 09/02/2020   CO2 29 08/16/2020   GLUCOSE 110 (H) 09/22/2020   GLUCOSE 101 (H)  09/02/2020   GLUCOSE 84 08/16/2020   BUN 34 (H) 09/22/2020   BUN 24 09/02/2020   BUN 28 (H) 08/16/2020   CREATININE 2.17 (H) 09/22/2020   CREATININE 1.98 (H) 09/02/2020   CREATININE 1.98 (H) 08/16/2020   CALCIUM 9.1 09/22/2020   CALCIUM 9.1 09/02/2020   CALCIUM 8.5 (L) 08/16/2020   LFT Recent Labs    09/22/20 0433  PROT 6.0*  ALBUMIN 2.7*  AST 15  ALT 9  ALKPHOS 62  BILITOT 0.3   PT/INR Lab Results  Component Value Date   INR 1.1 09/22/2020   INR 0.98 11/19/2016   INR 1.31 07/08/2010   Hepatitis Panel No results for input(s): HEPBSAG, HCVAB, HEPAIGM, HEPBIGM in the last 72 hours. C-Diff No components found for: CDIFF Lipase  No results found for: LIPASE  Drugs of Abuse  No results found for: LABOPIA, COCAINSCRNUR, LABBENZ, AMPHETMU, THCU,  LABBARB   RADIOLOGY STUDIES: CT HEAD WO CONTRAST  Result Date: 09/22/2020 CLINICAL DATA:  Transient ischemic attack. EXAM: CT HEAD WITHOUT CONTRAST TECHNIQUE: Contiguous axial images were obtained from the base of the skull through the vertex without intravenous contrast. COMPARISON:  11/21/2016 FINDINGS: Brain: Stable mild age related cerebral atrophy, ventriculomegaly and periventricular white matter disease. No extra-axial fluid collections are identified. Area remote encephalomalacia related to a prior insular cortex infarct on the left. No CT findings for acute hemispheric infarction or intracranial hemorrhage. No mass lesions. The brainstem and cerebellum are normal. Vascular: Vascular calcifications but aneurysm hyperdense vessels. Skull: No skull fracture bone lesions. Sinuses/Orbits: The paranasal sinuses mastoid air cells are clear. The globes are. Other: No scalp lesions scalp hematoma. IMPRESSION: 1. Stable mild age related cerebral atrophy, ventriculomegaly and periventricular white matter disease. 2. Remote left insular cortex infarct. 3. No acute intracranial findings or mass lesions. Electronically Signed   By: Marijo Sanes M.D.   On: 09/22/2020 06:10     IMPRESSION:      Normocytic anemia.  Acute on chronic.  Contributing is CKD stage 3 to 4.  Longstanding iron deficiency on once daily oral iron.  Receiving 1 of 2 ordered RBCs currently.     FOBT positive.  Patient reports dark stools and heartburn.  Rule out ulcer disease, gastritis.  Had gastritis on EGD in 04/2019.  Takes Pepcid 20 mg daily  Dysphagia at the level of GE junction.  Rule out stricture.  Rule out neoplasm.  Rule out dysmotility.  Has scheduled MBS for 09/29/2020  Chronic Eliquis following stroke 2017.  Last Eliquis was 09/22/2018 2 PM.  Received Kcentra reversal agent this morning.  Diagnosis colon cancer 04/2019 at colonoscopy.  Underwent robotic right colectomy with resection of the cancer as well as additional  tubular adenoma resections.  08/06/2020.  Urinary retention in recent weeks, indwelling Foley catheter in place.  CKD stage III-IV.  Currently with AKI.  Followed by nephrology at [facility].  Stage IIc melanoma of scalp.  Underwent sentinel lymph node biopsy and selective left neck dissection April 2019.  Site of resection with significant delay in healing.     PLAN:       EGD tomorrow.   Clear liquids today.  Upped Protonix IV to bid   Azucena Freed  09/22/2020, 10:04 AM Phone [phone]   Attending physician's note   I have taken an interval history, reviewed the chart and examined the patient. I agree with the Advanced Practitioner's note, impression and recommendations.   Symptomatic acute on chronic anemia with H +  stools. No overt bleeding. Hb 5.5 s/p 2U. GERD with intermittent dysphagia H/O Colon Ca 04/2019 s/p R hemicolectomy 07/2020. Post-op course complicated by SBO, managed conservatively. H/O CVA on Eliquis (last dose 9/6) CKD4  Plan: -IV Protonix -Transfuse to Hb >7 -CT AP with PO contrast (no IV contrast) -EGD in a.m. with possible dil. -If neg, may need colon. Not keen in getting it done at this point.   Carmell Austria, MD [facility] [phone]

## 2020-09-22 NOTE — H&P (Signed)
[facility] Admission History and Physical Service Pager: [phone]  Patient name: Keith [name] Medical record number: [id number] Date of birth: [birth year] Age: 80 y.o. Gender: male  Primary Care Provider: Rise Patience, DO Consultants: GI Code Status: DNR Preferred Emergency Contact: Louanne Skye, Son, [phone]  Chief Complaint: Speech difficulty  Assessment and Plan: Helios Kohlmann [place] is a 80 y.o. male presenting with speech difficulty and found to have symptomatic anemia with hgb of 5.5. PMH is significant for pAF, colon cancer, SAH, CVA, HLD, CKD stage IV, GERD, IDA and urinary retention with indwelling Foley catheter.  Symptomatic anemia  Hx of colon cancer  Hgb 5.5 on admission. 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  Since patient has been able to tolerate regular diet. Patient's HAS-BLED score of 4 and categorized as high risk for major bleed. This is second admission for symptomatic anemia.Eliquis reversal agent administered. Follows with oncology for scalp melanoma and transverse colon cancer now s/p resection in July.  - admit to medical tele, attending Dr. [name]  - 2 units pRBCs ordered  - f/u post transfusion Hgb  - GI following, appreciate recommendations  - patient scheduled for 9/8 EGD with Dr. Lyndel Safe - SCDs for DVT ppx  - recommend PT/OT following stabilization of bleeding, patient was receiving [facility] services prior to admission  - VS per floor protocol   Paroxysmal AF Hm meds include eliquis, requiring reversal due to likely GI bleed.  EKG on admission normal sinus rhythm. - cardiac monitoring  - holding home eliquis in setting of bleeding  - will need continued discussion of risk vs benefit of continued anticoagulation   Indwelling Foley Catheter Foley placed  for urinary retention during hospitalization in July 2022 for SBO.  Patient referred to follow with [facility]. Patient denies any gross hematuria that he has noted in his Foley bag.  No signs of hematuria on my exam today, urine is pale yellow in color. - monitor urine output for any signs of hematuria  Dysphagia Patient reports increasing difficulty with swallowing sandwich meat and rice over the last few weeks.  He states that he was in the process of getting a swallow study completed. -SLP evaluation for swallow study while inpatient  -CLD while awaiting GI recommendations   Hx CVA  speech difficulty (resolved)  Symptoms resolved on admission. Head CT with remote left insular cortex infarct without acute intracranial findings or mass lesions. Patient alert and oriented with 5/5 strength on exam of bilateral upper and lower extremities.  - monitor for changes in mental status   CKD, Stage 4 Creatinine 2.17 on admission. Appears to be near patient's baseline.  Creatinine has ranged from 1.98-2.5 since Jan 2022. Follows with nephrology as outpatient in [facility] system.  -Monitor creatinine with a.m. CMP  GERD  - switch to IV protonix while admitted    FEN/GI: CLD, NPO at 0500 9/8   Prophylaxis: SCDs due to active bleeding   Disposition: med tele   History of Present Illness:  Keith [name] is a 80 y.o. male presenting with speech difficulty and symptomatic anemia. Patient reports that following his stroke in 2017, he had right upper and lower extremity weakness.  He reports that around 350 this morning, he called his granddaughter into his room for help but could not formulate words and so he asked for her to call EMS.  He reports that he was  also feeling weak on that side and it reminded him of his prior stroke.  Patient reports that his symptoms have since resolved.  Patient son is present at bedside and states that his father appeared pale over the last couple of days and states that he often reported not feeling well.  They both deny any  frank blood in the patient's stool but do note some intermittent melena.  Patient denies diarrhea.  Patient denies any hematemesis or hemoptysis or hematuria.  They report that the patient was off of Eliquis for close to 1 year and did not have bleeding problems at that time.  Patient does not have any fevers or decreased appetite but does report some dysphagia for which she was planning to have a swallow study completed soon.  Patient reports that his appetite has been good and his son asked that the patient eats 5 times per day.  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  Patient's son states that they live no more than 5 miles away.  Review Of Systems: Per HPI with the following additions:   Review of Systems  Constitutional:  Negative for chills and fever.  HENT:  Positive for trouble swallowing. Negative for sneezing and sore throat.   Eyes:  Positive for visual disturbance.       Glaucoma in right eye   Respiratory:  Negative for cough, chest tightness and shortness of breath.   Cardiovascular:  Negative for chest pain and palpitations.  Gastrointestinal:  Negative for abdominal pain, diarrhea and nausea.       Melena  Genitourinary:  Negative for hematuria.  Musculoskeletal:  Negative for back pain.  Skin:  Positive for pallor.  Neurological:  Negative for seizures, facial asymmetry and headaches.  Psychiatric/Behavioral:  Negative for confusion.     Patient Active Problem List   Diagnosis Date Noted   Orthostatic hypotension 09/05/2020   Internal hernia 08/13/2020   Encounter for assessment for small bowel obstruciton 08/13/2020   Protein-calorie malnutrition, severe 08/11/2020   Cancer of transverse colon ([place]) 08/06/2020   Coronary artery calcification 05/25/2020   Adenocarcinoma ([place]) 05/22/2019   Mass of colon    Symptomatic anemia 05/08/2019   Gastroesophageal reflux disease 02/18/2019   Iron deficiency 09/26/2018    Anemia in stage 4 chronic kidney disease ([place]) 05/21/2018   Vitamin D deficiency 05/21/2018   History of CVA (cerebrovascular accident) 01/31/2018   TSH elevation 01/28/2018   Underweight 01/24/2018   CKD (chronic kidney disease) stage 4, GFR 15-29 ml/min (HCC) 12/10/2017   Hypotension 12/10/2017   Paroxysmal atrial fibrillation ([place]) 11/07/2017   Bilateral carotid artery stenosis 09/03/2017   Malignant melanoma of skin of scalp ([place]) 03/26/2017   Hyperlipidemia 02/08/2017   SAH (subarachnoid hemorrhage) ([place]) 11/20/2016   Cerebrovascular accident (CVA) due to occlusion of left carotid artery ([place]) 11/20/2016    Past Medical History: Past Medical History:  Diagnosis Date   Anemia    Cataract    planning surgery   Chronic kidney disease    "weak Kidneys"   Complication of anesthesia    patient expressed that he has difficulty post anesthesia with swallowing, chewing, and talking after surgery   Dysrhythmia    GERD (gastroesophageal reflux disease)    tums prn   Glaucoma    History of hiatal hernia    Hypothyroidism    Hypothyroidism, postsurgical 11/20/2016   Left middle cerebral artery stroke ([place]) 11/23/2016   Melanoma ([place])  scalp   Melanoma of scalp ([place]) 04/23/2017   Obstructive uropathy    PONV (postoperative nausea and vomiting) 03/20/2017   Stroke ([place]) 11/2016   speech and right side at time of stroke 04/20/17- all function return to normal   Underweight 01/24/2018    Past Surgical History: Past Surgical History:  Procedure Laterality Date   APPLICATION OF A-CELL OF HEAD/NECK N/A 04/23/2017   Procedure: APPLICATION OF INTREGRA;  Surgeon: Irene Limbo, MD;  Location: [place];  Service: Plastics;  Laterality: N/A;   BIOPSY  05/10/2019   Procedure: BIOPSY;  Surgeon: [name], MD;  Location: [place];  Service: Endoscopy;;   CATARACT EXTRACTION     COLONOSCOPY WITH PROPOFOL N/A 05/10/2019   Procedure: COLONOSCOPY WITH PROPOFOL;  Surgeon: [name], MD;  Location[facility] ENDOSCOPY;  Service: Endoscopy;  Laterality: N/A;   ESOPHAGOGASTRODUODENOSCOPY (EGD) WITH PROPOFOL N/A 05/10/2019   Procedure: ESOPHAGOGASTRODUODENOSCOPY (EGD) WITH PROPOFOL;  Surgeon: [name], MD;  Location: [facility] ENDOSCOPY;  Service: Endoscopy;  Laterality: N/A;   EXCISION MASS HEAD N/A 03/20/2017   Procedure: EXCISION scalp lesion;  Surgeon: Irene Limbo, MD;  Location: [place];  Service: Plastics;  Laterality: N/A;  EXCISION scalp lesion   EYE SURGERY     IR ANGIO INTRA EXTRACRAN SEL COM CAROTID INNOMINATE BILAT MOD SED  11/23/2016   IR ANGIO VERTEBRAL SEL VERTEBRAL UNI L MOD SED  11/23/2016   LOOP RECORDER INSERTION N/A 11/23/2016   Procedure: LOOP RECORDER INSERTION;  Surgeon: [name], MD;  Location: [place] CV LAB;  Service: Cardiovascular;  Laterality: N/A;   MASS EXCISION N/A 04/23/2017   Procedure: EXCISION MALIGNANT LESION OF SCALP;  Surgeon: Irene Limbo, MD;  Location: [place];  Service: Plastics;  Laterality: N/A;   MELANOMA EXCISION  04/23/2017   BILATERAL SUPERFICIAL PAROTIDECTOMY/NECK SENTINEL NODE BIOPSY WITH FROZEN SECTION; LEFT SELECTIVE NECK DISSECTION   PAROTIDECTOMY Bilateral 04/23/2017   Procedure: BILATERAL SUPERFICIAL PAROTIDECTOMY/NECK SENTINEL NODE BIOPSY WITH FROZEN SECTION; LEFT SELECTIVE NECK DISSECTION;  Surgeon: [name], MD;  Location: [facility];  Service: ENT;  Laterality: Bilateral;   POLYPECTOMY  05/10/2019   Procedure: POLYPECTOMY;  Surgeon: [name], MD;  Location: [place];  Service: Endoscopy;;   SKIN GRAFT SPLIT THICKNESS HEAD / NECK  06/04/2017   SKIN GRAFT SPLIT THICKNESS TO SCALP FROM RIGHT OR LEFT THIGH POSSIBLE A CELL TO DONOR SITE   SKIN SPLIT GRAFT N/A 06/04/2017   Procedure: SKIN GRAFT SPLIT THICKNESS TO SCALP FROM RIGHT OR LEFT THIGH POSSIBLE A CELL TO DONOR SITE;  Surgeon: Irene Limbo, MD;  Location: [place];  Service: Plastics;  Laterality: N/A;   SUBMUCOSAL TATTOO INJECTION  05/10/2019    Procedure: SUBMUCOSAL TATTOO INJECTION;  Surgeon: [name], MD;  Location: [facility] ENDOSCOPY;  Service: Endoscopy;;   THYROIDECTOMY     TONSILLECTOMY      Social History: Social History   Tobacco Use   Smoking status: Former    Packs/day: 2.00    Years: 32.00    Pack years: 64.00    Types: Cigarettes    Quit date: 1983    Years since quitting: 39.7   Smokeless tobacco: Never   Tobacco comments:    No plans to start  Vaping Use   Vaping Use: Never used  Substance Use Topics   Alcohol use: No   Drug use: No   Additional social history: patient's grand daugther lives with him and provides assistance with ADLs  Please also refer to relevant sections of EMR.  Family History: Family History  Problem Relation Age of Onset   Lung cancer Father    Lung cancer Mother    Diabetes Brother    Colon cancer Neg Hx    Colon polyps Neg Hx    Esophageal cancer Neg Hx    Stomach cancer Neg Hx    Pancreatic cancer Neg Hx     Allergies and Medications: Allergies  Allergen Reactions   Penicillins Other (See Comments)    Has patient had a PCN reaction causing immediate rash, facial/tongue/throat swelling, SOB or lightheadedness with hypotension: Unknown Has patient had a PCN reaction causing severe rash involving mucus membranes or skin necrosis: Unknown Has patient had a PCN reaction that required hospitalization: Unknown Has patient had a PCN reaction occurring within the last 10 years: No If all of the above answers are "NO", then may proceed with Cephalosporin use. Tolerated cephlasporins   No current facility-administered medications on file prior to encounter.   Current Outpatient Medications on File Prior to Encounter  Medication Sig Dispense Refill   apixaban (ELIQUIS) 2.5 MG TABS tablet Take 1 tablet (2.5 mg total) by mouth 2 (two) times daily. 180 tablet 1   atorvastatin (LIPITOR) 40 MG tablet TAKE 1 TABLET (40 MG TOTAL) BY MOUTH DAILY AT 6 PM. 90 tablet 1    cholecalciferol (VITAMIN D3) 25 MCG (1000 UNIT) tablet Take 1,000 Units by mouth daily.     Dapsone 5 % topical gel Apply 1 application topically 2 (two) times a week.     Ensure Plus (ENSURE PLUS) LIQD Take 237 mLs by mouth 2 (two) times daily between meals. Choc or Vanilla     famotidine (PEPCID) 20 MG tablet Take 1 tablet (20 mg total) by mouth 2 (two) times daily. (Patient taking differently: Take 20 mg by mouth daily.) 180 tablet 1   ferrous sulfate 325 (65 FE) MG tablet Take 325 mg by mouth daily.      midodrine (PROAMATINE) 2.5 MG tablet Take 1 tablet (2.5 mg total) by mouth 2 (two) times daily with a meal. 60 tablet 0   Multiple Vitamin (MULTIVITAMIN) capsule Take 1 capsule by mouth daily.      timolol (TIMOPTIC) 0.5 % ophthalmic solution Place 1 drop into both eyes 2 (two) times daily.   3   acetaminophen (TYLENOL) 500 MG tablet Take 2 tablets  by mouth every 6  hours. (Patient not taking: No sig reported) 30 tablet 0    Objective: BP (!) 122/45   Pulse 60   Temp 98.2 F (36.8 C)   Resp 15   Ht 5\' 9"  (1.753 m)   Wt 44.5 kg   SpO2 100%   BMI 14.47 kg/m   Exam: General: Elderly male, chronically ill-appearing,pale complexion, lying in bed in no acute distress, conversant Eyes: Pupils are equal and reactive to light, no scleral icterus Cardiovascular: Normal rate and rhythm Respiratory: Clear to auscultation without wheezing or crackles, stable on room air Gastrointestinal: Abdomen is soft and nontender to palpation, bowel sounds present throughout MSK: Moves bilateral upper and lower extremities with normal range of motion Derm: Bandages on scalp, secondary to resection Neuro: Pupils are equal round and reactive to light, extraocular muscles intact bilaterally, 5/5 strength in bilateral upper and lower extremities, sensation intact to light touch Psych: Appropriate affect and eye contact  Labs and Imaging: CBC BMET  Recent Labs  Lab 09/22/20 0433  WBC 7.8  HGB 5.5*   HCT 18.9*  PLT 354   Recent  Labs  Lab 09/22/20 0433  NA 134*  K 4.9  CL 103  CO2 23  BUN 34*  CREATININE 2.17*  GLUCOSE 110*  CALCIUM 9.1     EKG: NSR  Simmons-Robinson, Kristeena Meineke, MD 09/22/2020, 12:07 PM PGY-3, [place] Intern pager: [phone], text pages welcome

## 2020-09-22 NOTE — Consult Note (Addendum)
[name]: 10:04 AM 09/22/2020  LOS: 0 days    Referring Provider: [place] [name] in ED  Primary Care Physician:  Keith Patience, DO Primary Gastroenterologist:  Dr. Arelia Sneddon     Reason for Consultation: Anemia.   HPI: Keith [name] is a 80 y.o. male.  [place] 2017 CVA with complete resolution of right hemiweakness/paresis.  On chronic Eliquis.  CKD with GFR anywhere from the low 20s to mid 30s C/W stage 3b to stage 4 CKD.  Iron deficiency dates back to at least 2020.  Low iron 34, low iron sats, ferritin 24 on 06/24/2020; takes iron once daily.  No records of parenteral iron or CSA agent (epo) administration takes famotidine 20 mg bid.    04/2019 EGD for GI bleed.  4 cm HH, otherwise normal study.  Stomach biopsies obtained from normal-appearing mucosa.  Pathology from stomach showed mild, chronic inflammation, no H. pylori.  Duodenal pathology benign. 04/2019 colonoscopy.  For rectal bleeding.  Malignant looking tumor at distal transverse colon biopsied and tattooed.  4 polyps resected.  These measured anywhere from 4 to 12 mm.  The largest, 12 mm polypectomy site in the sigmoid was tattooed.  Mild sigmoid tics.  Normal examined TI.  Nonbleeding, small internal hemorrhoids.  Pathology confirmed adenocarcinoma in the transverse colon mass and sigmoid polyp showed carcinoma involved in tubular adenoma.  Other polyp in the a sending colon was a TA without HGD.  Not clear why it was delayed but pt did not undergo surgery until 5/39/7673 Dr Leighton Ruff performed robot assisted right colectomy.  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  Tumor invading muscularis and into pericolonic tissue.  Resection margins negative.  Within the resected did colon were also to tubular adenomas.   Discharged after 6-day admission on 7/27.  Discharge diagnosis included severe protein calorie malnutrition.  Patient reports that when he had his stroke in 2017 he weighed 130#, and dropped down to 106# prior to surgery.  Charge summary mentions limited diarrhea which responded to Imodium.  Patient had difficulty voiding and was discharged with chronic indwelling Foley catheter. Readmission 7/29 -08/19/2020 with SBO, syncope.  This improved with medical management.  Foley catheter in place at discharge.  Patient presents back to the ED at 430 this morning.  For a few days experiencing tremors in his arms and legs.  At one point his legs gave out from under him.  This was more noticeable yesterday so he asked his family to wake him up in the early morning to confirm he was doing okay, which they did.  At that point his speech was mumbled.  There was concern he was having a stroke and he was brought to the ED.  Patient says his appetites been good.  He does suffer from heartburn which he treats with Tums infrequently.  Having dysphagia to solid foods, rice, breads, pills and has a scheduled swallowing evaluation next week.  Dysphagia experienced at the lower esophagus.  At times he has had to regurgitate to relieve the dysphagia.  Also says his stools have been dark but not burgundy or melenic for a week or so.  Denies abdominal pain, swelling.  Hgb 5.5.  Baseline in July, early August was anywhere from ~ 8.1 to 10.4.  MCV is 98.  FOBT positive.  INR 1.1.  BUN and creatinine elevated from baseline at 34/2.1.   Protonix 40 mg IV every 24 hours initiated.  Denies family history of colorectal cancer, anemia, blood transfusions, ulcer disease.    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   Melanoma ([place])    scalp   Melanoma of scalp ([place]) 04/23/2017   Obstructive uropathy    PONV (postoperative nausea and vomiting) 03/20/2017   Stroke ([place]) 11/2016   speech and right side at time of stroke 04/20/17- all function return to normal   Underweight 01/24/2018    Past Surgical History:  Procedure Laterality Date   APPLICATION OF A-CELL OF HEAD/NECK N/A 04/23/2017   Procedure: APPLICATION OF INTREGRA;  Surgeon: Irene Limbo, MD;  Location: [place];  Service: Plastics;  Laterality: N/A;   BIOPSY  05/10/2019   Procedure: BIOPSY;  Surgeon: [name], MD;  Location: [place];  Service: Endoscopy;;   CATARACT EXTRACTION     COLONOSCOPY WITH PROPOFOL N/A 05/10/2019   Procedure: COLONOSCOPY WITH PROPOFOL;  Surgeon: [name], MD;  Location: [facility] ENDOSCOPY;  Service: Endoscopy;  Laterality: N/A;   ESOPHAGOGASTRODUODENOSCOPY (EGD) WITH PROPOFOL N/A 05/10/2019   Procedure: ESOPHAGOGASTRODUODENOSCOPY (EGD) WITH PROPOFOL;  Surgeon: [name], MD;  Location: [facility] END[facility]COPY;  Service: Endoscopy;  Laterality: N/A;   EXCISION MASS HEAD N/A 03/20/2017   Procedure: EXCISION scalp lesion;  Surgeon: Irene Limbo, MD;  Location: [place];  Service: Plastics;  Laterality: N/A;  EXCISION scalp lesion   EYE SURGERY     IR ANGIO INTRA EXTRACRAN SEL COM CAROTID INNOMINATE BILAT MOD SED  11/23/2016   IR ANGIO VERTEBRAL SEL VERTEBRAL UNI L MOD SED  11/23/2016   LOOP RECORDER INSERTION N/A 11/23/2016   Procedure: LOOP RECORDER INSERTION;  Surgeon: [name], MD;  Location: [place] CV LAB;  Service: Cardiovascular;  Laterality: N/A;   MASS EXCISION N/A 04/23/2017   Procedure: EXCISION MALIGNANT LESION OF SCALP;  Surgeon: Irene Limbo, MD;  Location: [place];  Service:  Plastics;  Laterality: N/A;   MELANOMA EXCISION  04/23/2017   BILATERAL SUPERFICIAL PAROTIDECTOMY/NECK SENTINEL NODE BIOPSY WITH FROZEN SECTION; LEFT SELECTIVE NECK DISSECTION   PAROTIDECTOMY Bilateral 04/23/2017   Procedure: BILATERAL SUPERFICIAL PAROTIDECTOMY/NECK SENTINEL NODE BIOPSY WITH FROZEN SECTION; LEFT SELECTIVE NECK DISSECTION;  Surgeon: [name], MD;  Location: [place];  Service: ENT;  Laterality: Bilateral;   POLYPECTOMY  05/10/2019   Procedure: POLYPECTOMY;  Surgeon: [name], MD;  Location: [place];  Service: Endoscopy;;   SKIN GRAFT SPLIT THICKNESS HEAD / NECK  06/04/2017   SKIN GRAFT SPLIT THICKNESS TO SCALP FROM RIGHT OR LEFT THIGH POSSIBLE A CELL TO DONOR SITE   SKIN SPLIT GRAFT N/A 06/04/2017   Procedure:  SKIN GRAFT SPLIT THICKNESS TO SCALP FROM RIGHT OR LEFT THIGH POSSIBLE A CELL TO DONOR SITE;  Surgeon: Irene Limbo, MD;  Location: [place];  Service: Plastics;  Laterality: N/A;   SUBMUCOSAL TATTOO INJECTION  05/10/2019   Procedure: SUBMUCOSAL TATTOO INJECTION;  Surgeon: [name], MD;  Location: [facility] ENDOSCOPY;  Service: Endoscopy;;   THYROIDECTOMY     TONSILLECTOMY      Prior to Admission medications   Medication Sig Start Date End Date Taking? Authorizing Provider  apixaban (ELIQUIS) 2.5 MG TABS tablet Take 1 tablet (2.5 mg total) by mouth 2 (two) times daily. 04/26/20  Yes Shirley Friar, PA-C  atorvastatin (LIPITOR) 40 MG tablet TAKE 1 TABLET (40 MG TOTAL) BY MOUTH DAILY AT 6 PM. 03/28/19  Yes Bonnita Hollow, MD  cholecalciferol (VITAMIN D3) 25 MCG (1000 UNIT) tablet Take 1,000 Units by mouth daily.   Yes [provider]  Dapsone 5 % topical gel Apply 1 application topically 2 (two) times a week. 02/08/18  Yes [provider]  Ensure Plus (ENSURE PLUS) LIQD Take 237 mLs by mouth 2 (two) times daily between meals. Choc or Vanilla   Yes [provider]  famotidine (PEPCID) 20 MG tablet Take 1 tablet (20 mg total) by mouth  2 (two) times daily. Patient taking differently: Take 20 mg by mouth daily. 08/23/20  Yes [name], [name], DO  ferrous sulfate 325 (65 FE) MG tablet Take 325 mg by mouth daily.    Yes [provider]  midodrine (PROAMATINE) 2.5 MG tablet Take 1 tablet (2.5 mg total) by mouth 2 (two) times daily with a meal. 09/16/20  Yes [name], [name], DO  Multiple Vitamin (MULTIVITAMIN) capsule Take 1 capsule by mouth daily.    Yes [provider]  timolol (TIMOPTIC) 0.5 % ophthalmic solution Place 1 drop into both eyes 2 (two) times daily.  11/24/17  Yes [provider]  acetaminophen (TYLENOL) 500 MG tablet Take 2 tablets  by mouth every 6  hours. Patient not taking: No sig reported 5/80/99   Leighton Ruff, MD    Scheduled Meds:  Infusions:  PRN Meds:    Allergies as of 09/22/2020 - Review Complete 09/22/2020  Allergen Reaction Noted   Penicillins Other (See Comments) 06/24/2014    Family History  Problem Relation Age of Onset   Lung cancer Father    Lung cancer Mother    Diabetes Brother    Colon cancer Neg Hx    Colon polyps Neg Hx    Esophageal cancer Neg Hx    Stomach cancer Neg Hx    Pancreatic cancer Neg Hx     Social History   Socioeconomic History   Marital status: Widowed    Spouse name: Not on file   Number of children: 1   Years of education: 7   Highest education level: 12th grade  Occupational History   Occupation: laborer    Comment: retired  Tobacco Use   Smoking status: Former    Packs/day: 2.00    Years: 32.00    Pack years: 64.00    Types: Cigarettes    Quit date: 1983    Years since quitting: 39.7   Smokeless tobacco: Never   Tobacco comments:    No plans to start  Vaping Use   Vaping Use: Never used  Substance and Sexual Activity   Alcohol use: No   Drug use: No   Sexual activity: Not Currently  Other Topics Concern   Not on  file  Social History Narrative   Patient lives alone in [place].    Patient has one son and a  daughter in law, [name]. Patient is close with them.    Patient has a dog named "boo boo bear." Great joy for him.   Patient enjoys spending time with his family and walking his dog.          Social Determinants of Health   Financial Resource Strain: Not on file  Food Insecurity: Not on file  Transportation Needs: Not on file  Physical Activity: Not on file  Stress: Not on file  Social Connections: Not on file  Intimate Partner Violence: Not on file    REVIEW OF SYSTEMS: Constitutional: Weakness. ENT:  No nose bleeds Pulm: Productive cough at times but no dyspnea. CV:  No palpitations, no LE edema.  No angina. GU:  No hematuria, no frequency.  Foley catheter for urinary retention as per HPI. GI: See HPI. Heme: Denies excessive or unusual bleeding or bruising. Transfusions: Recalls transfusions PRBC back in 2005. Neuro: Limb tremors as per HPI.  No seizures.  No recurrent syncope for more than 1 month. Derm: Locations on scalp where skin cancers had been removed have never successfully healed. Endocrine:  No sweats or chills.  No polyuria or dysuria Immunization: Reviewed.  He is received 4 separate Pfizer COVID-19 vaccines Travel:  None beyond local counties in last few months.    PHYSICAL EXAM: Vital signs in last 24 hours: Vitals:   09/22/20 0915 09/22/20 0930  BP: (!) 104/48 113/65  Pulse: (!) 59 78  Resp: 12 (!) 31  Temp:    SpO2: 100% 97%   Wt Readings from Last 3 Encounters:  09/22/20 44.5 kg  09/10/20 45.2 kg  09/02/20 44.9 kg    General: Thin, pale, somewhat unwell but comfortable and alert. Head: No facial asymmetry or swelling.  No signs of head trauma.  A bandage covers nonhealing sores on his scalp. Eyes: No scleral icterus.  No conjunctival pallor.  EOMI. Ears: Not hard of hearing Nose: No congestion or discharge Mouth: Edentulous.  Tongue midline.  Mucosa is moist, pink, clear. Neck: No thyromegaly, no masses, no JVD. Lungs: Slightly diminished but  clear.  No labored breathing or cough. Heart: RRR.  No MRG.  S1, S2 present. Abdomen: Soft without tenderness.  No HSM, masses, bruits, hernias.  Well-healed, 2 to 3 inch horizontal, surgical incision at pubic area.   Rectal: Deferred Musc/Skeltl: No joint redness, swelling or gross deformity. Extremities: No CCE. Neurologic: No tremors.  No limb weakness.  No facial asymmetry.  Moves all 4 limbs.  Fluid speech.  Good historian. Skin: No rash.  Sore on the top of the scalp as above. Tattoos: None observed. Nodes: No cervical adenopathy Psych: Pleasant, cooperative, calm.  Intake/Output from previous day: No intake/output data recorded. Intake/Output this shift: No intake/output data recorded.  LAB RESULTS: Recent Labs    09/22/20 0433  WBC 7.8  HGB 5.5*  HCT 18.9*  PLT 354   BMET Lab Results  Component Value Date   NA 134 (L) 09/22/2020   NA 138 09/02/2020   NA 142 08/16/2020   K 4.9 09/22/2020   K 4.5 09/03/2020   K 5.8 (H) 09/02/2020   CL 103 09/22/2020   CL 103 09/02/2020   CL 101 08/16/2020   CO2 23 09/22/2020   CO2 25 09/02/2020   CO2 29 08/16/2020   GLUCOSE 110 (H) 09/22/2020   GLUCOSE 101 (H)  09/02/2020   GLUCOSE 84 08/16/2020   BUN 34 (H) 09/22/2020   BUN 24 09/02/2020   BUN 28 (H) 08/16/2020   CREATININE 2.17 (H) 09/22/2020   CREATININE 1.98 (H) 09/02/2020   CREATININE 1.98 (H) 08/16/2020   CALCIUM 9.1 09/22/2020   CALCIUM 9.1 09/02/2020   CALCIUM 8.5 (L) 08/16/2020   LFT Recent Labs    09/22/20 0433  PROT 6.0*  ALBUMIN 2.7*  AST 15  ALT 9  ALKPHOS 62  BILITOT 0.3   PT/INR Lab Results  Component Value Date   INR 1.1 09/22/2020   INR 0.98 11/19/2016   INR 1.31 07/08/2010   Hepatitis Panel No results for input(s): HEPBSAG, HCVAB, HEPAIGM, HEPBIGM in the last 72 hours. C-Diff No components found for: CDIFF Lipase  No results found for: LIPASE  Drugs of Abuse  No results found for: LABOPIA, COCAINSCRNUR, LABBENZ, AMPHETMU, THCU,  LABBARB   RADIOLOGY STUDIES: CT HEAD WO CONTRAST  Result Date: 09/22/2020 CLINICAL DATA:  Transient ischemic attack. EXAM: CT HEAD WITHOUT CONTRAST TECHNIQUE: Contiguous axial images were obtained from the base of the skull through the vertex without intravenous contrast. COMPARISON:  11/21/2016 FINDINGS: Brain: Stable mild age related cerebral atrophy, ventriculomegaly and periventricular white matter disease. No extra-axial fluid collections are identified. Area remote encephalomalacia related to a prior insular cortex infarct on the left. No CT findings for acute hemispheric infarction or intracranial hemorrhage. No mass lesions. The brainstem and cerebellum are normal. Vascular: Vascular calcifications but aneurysm hyperdense vessels. Skull: No skull fracture bone lesions. Sinuses/Orbits: The paranasal sinuses mastoid air cells are clear. The globes are. Other: No scalp lesions scalp hematoma. IMPRESSION: 1. Stable mild age related cerebral atrophy, ventriculomegaly and periventricular white matter disease. 2. Remote left insular cortex infarct. 3. No acute intracranial findings or mass lesions. Electronically Signed   By: Marijo Sanes M.D.   On: 09/22/2020 06:10     IMPRESSION:      Normocytic anemia.  Acute on chronic.  Contributing is CKD stage 3 to 4.  Longstanding iron deficiency on once daily oral iron.  Receiving 1 of 2 ordered RBCs currently.     FOBT positive.  Patient reports dark stools and heartburn.  Rule out ulcer disease, gastritis.  Had gastritis on EGD in 04/2019.  Takes Pepcid 20 mg daily  Dysphagia at the level of GE junction.  Rule out stricture.  Rule out neoplasm.  Rule out dysmotility.  Has scheduled MBS for 09/29/2020  Chronic Eliquis following stroke 2017.  Last Eliquis was 09/22/2018 2 PM.  Received Kcentra reversal agent this morning.  Diagnosis colon cancer 04/2019 at colonoscopy.  Underwent robotic right colectomy with resection of the cancer as well as additional  tubular adenoma resections.  08/06/2020.  Urinary retention in recent weeks, indwelling Foley catheter in place.  CKD stage III-IV.  Currently with AKI.  Followed by nephrology at [facility].  Stage IIc melanoma of scalp.  Underwent sentinel lymph node biopsy and selective left neck dissection April 2019.  Site of resection with significant delay in healing.     PLAN:       EGD tomorrow.   Clear liquids today.  Upped Protonix IV to bid   Azucena Freed  09/22/2020, 10:04 AM Phone [phone]   Attending physician's note   I have taken an interval history, reviewed the chart and examined the patient. I agree with the Advanced Practitioner's note, impression and recommendations.   Symptomatic acute on chronic anemia with H +  stools. No overt bleeding. Hb 5.5 s/p 2U. GERD with intermittent dysphagia H/O Colon Ca 04/2019 s/p R hemicolectomy 07/2020. Post-op course complicated by SBO, managed conservatively. H/O CVA on Eliquis (last dose 9/6) CKD4  Plan: -IV Protonix -Transfuse to Hb >7 -CT AP with PO contrast (no IV contrast) -EGD in a.m. with possible dil. -If neg, may need colon. Not keen in getting it done at this point.   Carmell Austria, MD [facility] [phone]

## 2020-09-22 NOTE — Progress Notes (Signed)
FPTS Brief Progress Note  S: Patient was resting in bed.  He denies any complaints at this time.  He notes that after receiving blood he feels much better and is no longer having speech difficulty.   O: BP (!) 135/58 (BP Location: Right Arm)   Pulse 63   Temp 98.3 F (36.8 C) (Oral)   Resp 14   Ht 5\' 9"  (1.753 m)   Wt 44.5 kg   SpO2 100%   BMI 14.47 kg/m   Constitutional: No acute distress, elderly man laying in bed Respiratory: Normal work of breathing, CTA B Cardio: RRR Skin: Warm and dry Neuro: Behavior and mood normal  A/P: Keith [name] is our new admission from today who is a 80 year old male presenting initially with speech difficulty but was ultimately admitted for hemoglobin of 5.5.  He has since received 2 units of blood and feels much better.  He has been seen by GI with a history of colon cancer on 04/2019 with a right hemicolectomy in July 2022.  He is going to receive an EGD tomorrow morning.  Erskine Emery, MD 09/22/2020, 10:50 PM PGY-1, [facility] Night Resident  Please page [phone] with questions.

## 2020-09-22 NOTE — ED Triage Notes (Signed)
Pt brought by EMS for speech difficulty at 2300. Pt states it lasted for about 30 minutes. No neuro deficits noted at this time.

## 2020-09-22 NOTE — ED Notes (Signed)
Lab called, hemoglobin 5.5, PA Kelsie made aware

## 2020-09-22 NOTE — ED Provider Notes (Signed)
Emergency Medicine Provider Triage Evaluation Note  Keith [name] , a 80 y.o. male  was evaluated in triage.  Pt complains of speech difficulty. Pt reports tonight he had sudden onset of speech difficulty and he reports he couldn't get his words out. Symptoms lasted 15-30 minutes, and were resolved when EMS arrived. Pt has had issues with twitching movements in his right arm and leg and his leg giving out on him but this has been going on for a few weeks and he has had previous evaluation in the ED for this.  Currently patient is able to speak and provides entire history.  Last known well time 11 PM.  Review of Systems  Positive: Speech difficulty Negative: Headache, vision changes, numbness, weakness  Physical Exam  BP (!) 90/51 (BP Location: Right Arm)   Pulse 65   Temp 97.7 F (36.5 C) (Oral)   Resp 15   Ht 5\' 9"  (1.753 m)   Wt 44.5 kg   SpO2 100%   BMI 14.47 kg/m  Gen:   Awake, no distress   Resp:  Normal effort  MSK:   Moves extremities without difficulty  Other:  Speech is clear, able to follow commands, cranial nerves III through XII grossly intact, 5/5 strength in bilateral upper and lower extremities and normal sensation, no pronator drift  Medical Decision Making  Medically screening exam initiated at 4:33 AM.  Appropriate orders placed.  Keith [name] was informed that the remainder of the evaluation will be completed by another provider, this initial triage assessment does not replace that evaluation, and the importance of remaining in the ED until their evaluation is complete.  Last known well time 11 PM, speech symptoms lasted less than 30 minutes and currently patient with no deficits, will initiate work-up for TIA.  BP noted to be 90/51, on chart review this appears to be close to patient's typical baseline blood pressure   Jacqlyn Larsen, [place] 09/22/20 0443    Quintella Reichert, MD 09/22/20 0700

## 2020-09-22 NOTE — ED Provider Notes (Signed)
[name] EMERGENCY DEPARTMENT Provider Note   CSN: [id number] Arrival date & time: 09/22/20  0434     History Chief Complaint  Patient presents with   Speech Problem    Keith [name] is a 80 y.o. male history includes paroxysmal A. fib, CVA, on Eliquis, GERD, colon cancer status postresection, CKD, GERD, anemia.  Patient presents to the ER today for speech difficulty and right-sided and twitching intermittent for the past few days.  Patient reports speech difficulty has resolved, he denies any associated numbness, vision changes, balance issues or weakness.  Patient feels that his right hand is still occasionally twitching and is not sure what is causing that.  Finally patient reports that he has been feeling generalized weakness for the past few days as well, he denies any blood in his stool.  He denies fever/chills, fall/injury, headache, chest pain, abdominal pain, vomiting, diarrhea or any additional concerns.  HPI     Past Medical History:  Diagnosis Date   Anemia    Cataract    planning surgery   Chronic kidney disease    "weak Kidneys"   Complication of anesthesia    patient expressed that he has difficulty post anesthesia with swallowing, chewing, and talking after surgery   Dysrhythmia    GERD (gastroesophageal reflux disease)    tums prn   Glaucoma    History of hiatal hernia    Hypothyroidism    Hypothyroidism, postsurgical 11/20/2016   Left middle cerebral artery stroke ([place]) 11/23/2016   Melanoma ([place])    scalp   Melanoma of scalp ([place]) 04/23/2017   Obstructive uropathy    PONV (postoperative nausea and vomiting) 03/20/2017   Stroke ([place]) 11/2016   speech and right side at time of stroke 04/20/17- all function return to normal   Underweight 01/24/2018    Patient Active Problem List   Diagnosis Date Noted   Orthostatic hypotension 09/05/2020   Internal hernia 08/13/2020   Encounter for assessment for small bowel obstruciton  08/13/2020   Protein-calorie malnutrition, severe 08/11/2020   Cancer of transverse colon ([place]) 08/06/2020   Coronary artery calcification 05/25/2020   Adenocarcinoma ([place]) 05/22/2019   Mass of colon    Symptomatic anemia 05/08/2019   Gastroesophageal reflux disease 02/18/2019   Iron deficiency 09/26/2018   Anemia in stage 4 chronic kidney disease ([place]) 05/21/2018   Vitamin D deficiency 05/21/2018   History of CVA (cerebrovascular accident) 01/31/2018   TSH elevation 01/28/2018   Underweight 01/24/2018   CKD (chronic kidney disease) stage 4, GFR 15-29 ml/min (HCC) 12/10/2017   Hypotension 12/10/2017   Paroxysmal atrial fibrillation ([place]) 11/07/2017   Bilateral carotid artery stenosis 09/03/2017   Malignant melanoma of skin of scalp ([place]) 03/26/2017   Hyperlipidemia 02/08/2017   SAH (subarachnoid hemorrhage) ([place]) 11/20/2016   Cerebrovascular accident (CVA) due to occlusion of left carotid artery ([place]) 11/20/2016    Past Surgical History:  Procedure Laterality Date   APPLICATION OF A-CELL OF HEAD/NECK N/A 04/23/2017   Procedure: APPLICATION OF INTREGRA;  Surgeon: Irene Limbo, MD;  Location: [facility];  Service: Plastics;  Laterality: N/A;   BIOPSY  05/10/2019   Procedure: BIOPSY;  Surgeon: [name], MD;  Location: [facility];  Service: Endoscopy;;   CATARACT EXTRACTION     COLONOSCOPY WITH PROPOFOL N/A 05/10/2019   Procedure: COLONOSCOPY WITH PROPOFOL;  Surgeon: [name], MD;  Location: [facility] ENDOSCOPY;  Service: Endoscopy;  Laterality: N/A;   ESOPHAGOGASTRODUODENOSCOPY (EGD) WITH PROPOFOL N/A 05/10/2019   Procedure: ESOPHAGOGASTRODUODENOSCOPY (  EGD) WITH PROPOFOL;  Surgeon: [name], MD;  Location: [facility] ENDOSCOPY;  Service: Endoscopy;  Laterality: N/A;   EXCISION MASS HEAD N/A 03/20/2017   Procedure: EXCISION scalp lesion;  Surgeon: Irene Limbo, MD;  Location: [facility];  Service: Plastics;  Laterality: N/A;  EXCISION scalp lesion   EYE SURGERY     IR ANGIO INTRA  EXTRACRAN SEL COM CAROTID INNOMINATE BILAT MOD SED  11/23/2016   IR ANGIO VERTEBRAL SEL VERTEBRAL UNI L MOD SED  11/23/2016   LOOP RECORDER INSERTION N/A 11/23/2016   Procedure: LOOP RECORDER INSERTION;  Surgeon: [name], MD;  Location: [place] CV LAB;  Service: Cardiovascular;  Laterality: N/A;   MASS EXCISION N/A 04/23/2017   Procedure: EXCISION MALIGNANT LESION OF SCALP;  Surgeon: Irene Limbo, MD;  Location: [place];  Service: Plastics;  Laterality: N/A;   MELANOMA EXCISION  04/23/2017   BILATERAL SUPERFICIAL PAROTIDECTOMY/NECK SENTINEL NODE BIOPSY WITH FROZEN SECTION; LEFT SELECTIVE NECK DISSECTION   PAROTIDECTOMY Bilateral 04/23/2017   Procedure: BILATERAL SUPERFICIAL PAROTIDECTOMY/NECK SENTINEL NODE BIOPSY WITH FROZEN SECTION; LEFT SELECTIVE NECK DISSECTION;  Surgeon: [name], MD;  Location: [place];  Service: ENT;  Laterality: Bilateral;   POLYPECTOMY  05/10/2019   Procedure: POLYPECTOMY;  Surgeon: [name], MD;  Location: [place];  Service: Endoscopy;;   SKIN GRAFT SPLIT THICKNESS HEAD / NECK  06/04/2017   SKIN GRAFT SPLIT THICKNESS TO SCALP FROM RIGHT OR LEFT THIGH POSSIBLE A CELL TO DONOR SITE   SKIN SPLIT GRAFT N/A 06/04/2017   Procedure: SKIN GRAFT SPLIT THICKNESS TO SCALP FROM RIGHT OR LEFT THIGH POSSIBLE A CELL TO DONOR SITE;  Surgeon: Irene Limbo, MD;  Location: [place];  Service: Plastics;  Laterality: N/A;   SUBMUCOSAL TATTOO INJECTION  05/10/2019   Procedure: SUBMUCOSAL TATTOO INJECTION;  Surgeon: [name], MD;  Location: [facility] ENDOSCOPY;  Service: Endoscopy;;   THYROIDECTOMY     TONSILLECTOMY         Family History  Problem Relation Age of Onset   Lung cancer Father    Lung cancer Mother    Diabetes Brother    Colon cancer Neg Hx    Colon polyps Neg Hx    Esophageal cancer Neg Hx    Stomach cancer Neg Hx    Pancreatic cancer Neg Hx     Social History   Tobacco Use   Smoking status: Former    Packs/day: 2.00    Years: 32.00    Pack  years: 64.00    Types: Cigarettes    Quit date: 1983    Years since quitting: 39.7   Smokeless tobacco: Never   Tobacco comments:    No plans to start  Vaping Use   Vaping Use: Never used  Substance Use Topics   Alcohol use: No   Drug use: No    Home Medications Prior to Admission medications   Medication Sig Start Date End Date Taking? Authorizing Provider  apixaban (ELIQUIS) 2.5 MG TABS tablet Take 1 tablet (2.5 mg total) by mouth 2 (two) times daily. 04/26/20  Yes Shirley Friar, PA-C  atorvastatin (LIPITOR) 40 MG tablet TAKE 1 TABLET (40 MG TOTAL) BY MOUTH DAILY AT 6 PM. 03/28/19  Yes Bonnita Hollow, MD  cholecalciferol (VITAMIN D3) 25 MCG (1000 UNIT) tablet Take 1,000 Units by mouth daily.   Yes [provider]  Dapsone 5 % topical gel Apply 1 application topically 2 (two) times a week. 02/08/18  Yes [provider]  Ensure Plus (ENSURE  PLUS) LIQD Take 237 mLs by mouth 2 (two) times daily between meals. Choc or Vanilla   Yes [provider]  famotidine (PEPCID) 20 MG tablet Take 1 tablet (20 mg total) by mouth 2 (two) times daily. Patient taking differently: Take 20 mg by mouth daily. 08/23/20  Yes [name], [name], DO  ferrous sulfate 325 (65 FE) MG tablet Take 325 mg by mouth daily.    Yes [provider]  midodrine (PROAMATINE) 2.5 MG tablet Take 1 tablet (2.5 mg total) by mouth 2 (two) times daily with a meal. 09/16/20  Yes [name], [name], DO  Multiple Vitamin (MULTIVITAMIN) capsule Take 1 capsule by mouth daily.    Yes [provider]  timolol (TIMOPTIC) 0.5 % ophthalmic solution Place 1 drop into both eyes 2 (two) times daily.  11/24/17  Yes [provider]  acetaminophen (TYLENOL) 500 MG tablet Take 2 tablets  by mouth every 6  hours. Patient not taking: No sig reported 06/17/07   Leighton Ruff, MD    Allergies    Penicillins  Review of Systems   Review of Systems Ten systems are reviewed and are negative for  acute change except as noted in the HPI  Physical Exam Updated Vital Signs BP 113/65   Pulse 78   Temp 98 F (36.7 C) (Oral)   Resp (!) 31   Ht 5\' 9"  (1.753 m)   Wt 44.5 kg   SpO2 97%   BMI 14.47 kg/m   Physical Exam Constitutional:      General: He is not in acute distress.    Appearance: Normal appearance. He is well-developed. He is not ill-appearing or diaphoretic.  HENT:     Head: Normocephalic and atraumatic.  Eyes:     General: Vision grossly intact. Gaze aligned appropriately.     Pupils: Pupils are equal, round, and reactive to light.  Neck:     Trachea: Trachea and phonation normal.  Pulmonary:     Effort: Pulmonary effort is normal. No respiratory distress.  Abdominal:     General: There is no distension.     Palpations: Abdomen is soft.     Tenderness: There is no abdominal tenderness. There is no guarding or rebound.  Genitourinary:    Comments: Rectal examination chaperoned by Drucie Opitz.  Palpable internal hemorrhoid.  No gross blood on exam.  Dark stool present.  Normal rectal tone. Musculoskeletal:        General: Normal range of motion.     Cervical back: Normal range of motion.  Skin:    General: Skin is warm and dry.  Neurological:     Mental Status: He is alert.     GCS: GCS eye subscore is 4. GCS verbal subscore is 5. GCS motor subscore is 6.     Comments: Speech is clear and goal oriented, follows commands Major Cranial nerves without deficit, no facial droop Moves extremities without ataxia, coordination intact  Psychiatric:        Behavior: Behavior normal.    ED Results / Procedures / Treatments   Labs (all labs ordered are listed, but only abnormal results are displayed) Labs Reviewed  COMPREHENSIVE METABOLIC PANEL - Abnormal; Notable for the following components:      Result Value   Sodium 134 (*)    Glucose, Bld 110 (*)    BUN 34 (*)    Creatinine, Ser 2.17 (*)    Total Protein 6.0 (*)    Albumin 2.7 (*)    GFR,  Estimated 30 (*)     All other components within normal limits  CBC WITH DIFFERENTIAL/PLATELET - Abnormal; Notable for the following components:   RBC 1.92 (*)    Hemoglobin 5.5 (*)    HCT 18.9 (*)    MCHC 29.1 (*)    RDW 16.1 (*)    All other components within normal limits  POC OCCULT BLOOD, ED - Abnormal; Notable for the following components:   Fecal Occult Bld POSITIVE (*)    All other components within normal limits  SARS CORONAVIRUS 2 (TAT 6-24 HRS)  PROTIME-INR  APTT  URINALYSIS, ROUTINE W REFLEX MICROSCOPIC  MAGNESIUM  CBG MONITORING, ED  TYPE AND SCREEN  PREPARE RBC (CROSSMATCH)    EKG EKG Interpretation  Date/Time:  Wednesday September 22 2020 04:46:01 EDT Ventricular Rate:  86 PR Interval:  176 QRS Duration: 104 QT Interval:  398 QTC Calculation: 476 R Axis:   -57 Text Interpretation: Normal sinus rhythm with sinus arrhythmia Left axis deviation Incomplete right bundle branch block Nonspecific ST abnormality Confirmed by Randal Buba, April ([id number]) on 09/22/2020 6:49:34 AM  Radiology CT HEAD WO CONTRAST  Result Date: 09/22/2020 CLINICAL DATA:  Transient ischemic attack. EXAM: CT HEAD WITHOUT CONTRAST TECHNIQUE: Contiguous axial images were obtained from the base of the skull through the vertex without intravenous contrast. COMPARISON:  11/21/2016 FINDINGS: Brain: Stable mild age related cerebral atrophy, ventriculomegaly and periventricular white matter disease. No extra-axial fluid collections are identified. Area remote encephalomalacia related to a prior insular cortex infarct on the left. No CT findings for acute hemispheric infarction or intracranial hemorrhage. No mass lesions. The brainstem and cerebellum are normal. Vascular: Vascular calcifications but aneurysm hyperdense vessels. Skull: No skull fracture bone lesions. Sinuses/Orbits: The paranasal sinuses mastoid air cells are clear. The globes are. Other: No scalp lesions scalp hematoma. IMPRESSION: 1. Stable mild age related cerebral  atrophy, ventriculomegaly and periventricular white matter disease. 2. Remote left insular cortex infarct. 3. No acute intracranial findings or mass lesions. Electronically Signed   By: Marijo Sanes M.D.   On: 09/22/2020 06:10    Procedures .Critical Care  Date/Time: 09/22/2020 9:45 AM Performed by: Deliah Boston, PA-C Authorized by: Deliah Boston, PA-C   Critical care provider statement:    Critical care time (minutes):  40   Critical care was necessary to treat or prevent imminent or life-threatening deterioration of the following conditions: Symptomatic Anemia and GI bleed requiring blood transfusion.   Critical care was time spent personally by me on the following activities:  Discussions with consultants, evaluation of patient's response to treatment, examination of patient, ordering and performing treatments and interventions, ordering and review of laboratory studies, ordering and review of radiographic studies, pulse oximetry, re-evaluation of patient's condition, obtaining history from patient or surrogate, review of old charts and development of treatment plan with patient or surrogate   Medications Ordered in ED Medications  atorvastatin (LIPITOR) tablet 40 mg (has no administration in time range)  pantoprazole (PROTONIX) injection 40 mg (has no administration in time range)  acetaminophen (TYLENOL) tablet 650 mg (has no administration in time range)    Or  acetaminophen (TYLENOL) suppository 650 mg (has no administration in time range)  sodium chloride 0.9 % bolus 500 mL (500 mLs Intravenous New Bag/Given 09/22/20 0818)  0.9 %  sodium chloride infusion (10 mL/hr Intravenous New Bag/Given 09/22/20 0848)  prothrombin complex conc human (KCENTRA) IVPB 2,252 Units (0 Units Intravenous Stopped 09/22/20 0917)    ED Course  I have  reviewed the triage vital signs and the nursing notes.  Pertinent labs & imaging results that were available during my care of the patient were  reviewed by me and considered in my medical decision making (see chart for details).    MDM Rules/Calculators/A&P                           Additional history obtained from: Nursing notes from this visit. Review of electronic medical records. Patient's son at bedside. --------- I reviewed and interpreted labs which include: CBG 93 Hemoccult positive Type and screen O+, antibody negative CMP shows no emergent electrolyte derangement, LFT elevations or gap.  Creatinine and BUN slightly elevated from baseline. INR within normal limits. CBC shows anemia of 5.5, no leukocytosis or thrombocytopenia.  EKG: Normal sinus rhythm with sinus arrhythmia Left axis deviation Incomplete right bundle branch block Nonspecific ST abnormality Confirmed by Palumbo, April ([id number]) on 09/22/2020 6:49:34 AM  CT head:    IMPRESSION:  1. Stable mild age related cerebral atrophy, ventriculomegaly and  periventricular white matter disease.  2. Remote left insular cortex infarct.  3. No acute intracranial findings or mass lesions.   On reevaluation patient remained stable no acute distress vital signs stable.  Risk versus benefits of blood transfusion were discussed with patient, patient consented for blood transfusion.  Transfusion 2 units indicated given patient's symptomatic anemia and apparent GI bleed.  Case was discussed with Dr. Almyra Free, decision was made to reverse patient's Eliquis in the setting of GI bleed.  I consulted gastroenterology who has seen and evaluated the patient.  I consulted with family medicine service and patient was accepted for admission.    Note: Portions of this report may have been transcribed using voice recognition software. Every effort was made to ensure accuracy; however, inadvertent computerized transcription errors may still be present.  Final Clinical Impression(s) / ED Diagnoses Final diagnoses:  Symptomatic anemia  Difficulty with speech    Rx / DC Orders ED  Discharge Orders     None        Gari Crown 09/22/20 1100    Luna Fuse, MD 10/02/20 1553

## 2020-09-23 ENCOUNTER — Inpatient Hospital Stay (HOSPITAL_COMMUNITY): Payer: Medicare Other | Admitting: Certified Registered"

## 2020-09-23 ENCOUNTER — Encounter (HOSPITAL_COMMUNITY)
Admission: EM | Disposition: A | Discharge status: Home Health | Payer: Self-pay | Source: Home / Self Care | Attending: Family Medicine

## 2020-09-23 ENCOUNTER — Inpatient Hospital Stay (HOSPITAL_COMMUNITY): Payer: Medicare Other

## 2020-09-23 ENCOUNTER — Encounter: Payer: Self-pay | Admitting: Family Medicine

## 2020-09-23 DIAGNOSIS — K3189 Other diseases of stomach and duodenum: Secondary | ICD-10-CM

## 2020-09-23 DIAGNOSIS — K222 Esophageal obstruction: Secondary | ICD-10-CM | POA: Diagnosis not present

## 2020-09-23 DIAGNOSIS — R1310 Dysphagia, unspecified: Secondary | ICD-10-CM

## 2020-09-23 DIAGNOSIS — D62 Acute posthemorrhagic anemia: Secondary | ICD-10-CM

## 2020-09-23 HISTORY — PX: BIOPSY: SHX5522

## 2020-09-23 HISTORY — PX: ESOPHAGOGASTRODUODENOSCOPY (EGD) WITH PROPOFOL: SHX5813

## 2020-09-23 LAB — CBC
HCT: 25.3 % — ABNORMAL LOW (ref 39.0–52.0)
Hemoglobin: 7.9 g/dL — ABNORMAL LOW (ref 13.0–17.0)
MCH: 28.6 pg (ref 26.0–34.0)
MCHC: 31.2 g/dL (ref 30.0–36.0)
MCV: 91.7 fL (ref 80.0–100.0)
Platelets: 256 10*3/uL (ref 150–400)
RBC: 2.76 MIL/uL — ABNORMAL LOW (ref 4.22–5.81)
RDW: 15.6 % — ABNORMAL HIGH (ref 11.5–15.5)
WBC: 6.4 10*3/uL (ref 4.0–10.5)
nRBC: 0 % (ref 0.0–0.2)

## 2020-09-23 LAB — PROTIME-INR
INR: 1 (ref 0.8–1.2)
Prothrombin Time: 13.2 seconds (ref 11.4–15.2)

## 2020-09-23 LAB — BPAM RBC
Blood Product Expiration Date: 202210062359
Blood Product Expiration Date: 202210062359
ISSUE DATE / TIME: 202209070831
ISSUE DATE / TIME: 202209071135
Unit Type and Rh: 5100
Unit Type and Rh: 5100

## 2020-09-23 LAB — TYPE AND SCREEN
ABO/RH(D): O POS
Antibody Screen: NEGATIVE
Unit division: 0
Unit division: 0

## 2020-09-23 LAB — COMPREHENSIVE METABOLIC PANEL
ALT: 8 U/L (ref 0–44)
AST: 13 U/L — ABNORMAL LOW (ref 15–41)
Albumin: 2.5 g/dL — ABNORMAL LOW (ref 3.5–5.0)
Alkaline Phosphatase: 59 U/L (ref 38–126)
Anion gap: 6 (ref 5–15)
BUN: 28 mg/dL — ABNORMAL HIGH (ref 8–23)
CO2: 25 mmol/L (ref 22–32)
Calcium: 9 mg/dL (ref 8.9–10.3)
Chloride: 106 mmol/L (ref 98–111)
Creatinine, Ser: 2.15 mg/dL — ABNORMAL HIGH (ref 0.61–1.24)
GFR, Estimated: 31 mL/min — ABNORMAL LOW (ref >60)
Glucose, Bld: 91 mg/dL (ref 70–99)
Potassium: 4.6 mmol/L (ref 3.5–5.1)
Sodium: 137 mmol/L (ref 135–145)
Total Bilirubin: 1 mg/dL (ref 0.3–1.2)
Total Protein: 5.5 g/dL — ABNORMAL LOW (ref 6.5–8.1)

## 2020-09-23 LAB — SARS CORONAVIRUS 2 (TAT 6-24 HRS): SARS Coronavirus 2: NEGATIVE

## 2020-09-23 LAB — HEMOGLOBIN AND HEMATOCRIT, BLOOD
HCT: 27.3 % — ABNORMAL LOW (ref 39.0–52.0)
Hemoglobin: 8.6 g/dL — ABNORMAL LOW (ref 13.0–17.0)

## 2020-09-23 SURGERY — ESOPHAGOGASTRODUODENOSCOPY (EGD) WITH PROPOFOL
Anesthesia: Monitor Anesthesia Care

## 2020-09-23 MED ORDER — PEG-KCL-NACL-NASULF-NA ASC-C 100 G PO SOLR
0.5000 | Freq: Once | ORAL | Status: AC
  Administered 2020-09-23: 100 g via ORAL
  Filled 2020-09-23: qty 1

## 2020-09-23 MED ORDER — CHLORHEXIDINE GLUCONATE CLOTH 2 % EX PADS
6.0000 | MEDICATED_PAD | Freq: Every day | CUTANEOUS | Status: DC
  Administered 2020-09-24 – 2020-09-26 (×3): 6 via TOPICAL

## 2020-09-23 MED ORDER — LIDOCAINE 2% (20 MG/ML) 5 ML SYRINGE
INTRAMUSCULAR | Status: DC | PRN
  Administered 2020-09-23: 60 mg via INTRAVENOUS

## 2020-09-23 MED ORDER — SODIUM CHLORIDE 0.9 % IV SOLN
INTRAVENOUS | Status: AC | PRN
  Administered 2020-09-23: 500 mL via INTRAMUSCULAR

## 2020-09-23 MED ORDER — PHENYLEPHRINE 40 MCG/ML (10ML) SYRINGE FOR IV PUSH (FOR BLOOD PRESSURE SUPPORT)
PREFILLED_SYRINGE | INTRAVENOUS | Status: DC | PRN
  Administered 2020-09-23: 80 ug via INTRAVENOUS

## 2020-09-23 MED ORDER — PROPOFOL 10 MG/ML IV BOLUS
INTRAVENOUS | Status: DC | PRN
  Administered 2020-09-23 (×2): 20 mg via INTRAVENOUS

## 2020-09-23 MED ORDER — PROPOFOL 500 MG/50ML IV EMUL
INTRAVENOUS | Status: DC | PRN
  Administered 2020-09-23: 100 ug/kg/min via INTRAVENOUS

## 2020-09-23 MED ORDER — TIMOLOL MALEATE 0.5 % OP SOLN
1.0000 [drp] | Freq: Two times a day (BID) | OPHTHALMIC | Status: DC
  Administered 2020-09-23 – 2020-09-26 (×5): 1 [drp] via OPHTHALMIC
  Filled 2020-09-23: qty 5

## 2020-09-23 MED ORDER — ADULT MULTIVITAMIN W/MINERALS CH
1.0000 | ORAL_TABLET | Freq: Every day | ORAL | Status: DC
  Administered 2020-09-23 – 2020-09-26 (×3): 1 via ORAL
  Filled 2020-09-23 (×3): qty 1

## 2020-09-23 MED ORDER — PEG-KCL-NACL-NASULF-NA ASC-C 100 G PO SOLR: 1.0000 | Freq: Once | ORAL | Status: DC

## 2020-09-23 SURGICAL SUPPLY — 15 items

## 2020-09-23 NOTE — Progress Notes (Signed)
Family Medicine Teaching Service Daily Progress Note Intern Pager: [phone]  Patient name: Keith [name] Medical record number: [id number] Date of birth: [birth year] Age: 80 y.o. Gender: male  Primary Care Provider: Rise Patience, DO Consultants: GI Code Status: DNR  Assessment and Plan: Keith [name] is a 80 y.o. male presenting with speech difficulty and found to have symptomatic anemia with hgb of 5.5. PMH is significant for pAF, colon cancer, SAH, CVA, HLD, CKD stage IV, GERD, IDA and urinary retention with indwelling Foley catheter.   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 Came to the ED for dysarthria, which resolved on admission. Patient reports recent and continued melena. Found to have a hgb of 5.5. Now S/P 2U PRBCs, Hgb 7.9 on recheck. Eliquis for AF reversed. GI consulted, plan for EGD this AM, colonoscopy if necessary.  -GI consult -Transfuse to above 7, per GI recs -IV Protonix 40 mg BID -Recheck hgb tomorrow this afternoon -SCDs for DVT PPX -PT/OT  Paroxysmal AF Hm meds include eliquis, requiring reversal due to GI bleed.  EKG on admission normal sinus rhythm. - cardiac monitoring  - holding home eliquis in setting of bleeding  - will need continued discussion of risk vs benefit of continued anticoagulation   Hx CVA  speech difficulty (resolved)  Symptoms resolved on admission. Head CT with remote left insular cortex infarct without acute intracranial findings or mass lesions. MRI brain with no concern for acute infarct. (See below for other findings). Patient alert and oriented with 5/5 strength on exam of bilateral upper and lower extremities.  -monitor for changes in mental status   MRI incidental findings Sclerosis of the calvarium c/f malignancy or post treatment changes (pt had a melanoma excised at this location). Radiology recommending contrasted MRI study. -Call  radiology and ask about gadolinium and CKD status   Indwelling Foley Catheter Foley placed  for urinary retention during hospitalization in July 2022 for SBO. Patient referred to follow with [facility]. -Monitor urine output for any signs of hematuria   Dysphagia Patient reports increasing difficulty with swallowing sandwich meat and rice over the last few weeks.  He states that he was in the process of getting a swallow study completed. -SLP evaluation for swallow study while inpatient   Malnutrition 30 lb weight loss recently -RD consult -F/u recs  CKD, Stage 4 Creatinine 2.17 on admission. Appears to be near patient's baseline.  Creatinine has ranged from 1.98-2.5 since Jan 2022. Follows with nephrology as outpatient in [facility]. Cr of 2.15 this AM.  -Monitor creatinine with a.m. BMPs   GERD  - switch to IV protonix while admitted    FEN/GI: CLD, until SLP eval Prophylaxis: SCDs due to active bleeding   Subjective:  Patient found in NAD lying in bed. Fully alert and oriented. Reports resolution of dysarthria (no evidence of any on exam). Reports continued melanic stools. ROS negative for CP, SoB, N/V.   Objective: Temp:  [97.8 F (36.6 C)-98.3 F (36.8 C)] 97.9 F (36.6 C) (09/08 0752) Pulse Rate:  [57-78] 67 (09/08 0752) Resp:  [9-31] 16 (09/08 0752) BP: (100-138)/(39-78) 100/54 (09/08 0752) SpO2:  [93 %-100 %] 98 % (09/08 0752) Physical Exam Vitals reviewed.  Cardiovascular:     Rate and Rhythm: Normal rate and regular rhythm.     Pulses: Normal pulses.  Pulmonary:     Effort: Pulmonary effort is normal.  Breath sounds: Normal breath sounds.  Abdominal:     General: Bowel sounds are normal.     Palpations: Abdomen is soft.     Tenderness: There is no abdominal tenderness.  Neurological:     General: No focal deficit present.     Mental Status: He is alert.     Cranial Nerves: No cranial nerve deficit.     Laboratory: Recent Labs  Lab  09/22/20 0433 09/23/20 0426  WBC 7.8 6.4  HGB 5.5* 7.9*  HCT 18.9* 25.3*  PLT 354 256   Recent Labs  Lab 09/22/20 0433 09/23/20 0426  NA 134* 137  K 4.9 4.6  CL 103 106  CO2 23 25  BUN 34* 28*  CREATININE 2.17* 2.15*  CALCIUM 9.1 9.0  PROT 6.0* 5.5*  BILITOT 0.3 1.0  ALKPHOS 62 59  ALT 9 8  AST 15 13*  GLUCOSE 110* 91     Imaging/Diagnostic Tests: MRI as above  Corky Sox PGY-1, Psychiatry FPTS Intern pager: [phone], text pages welcome

## 2020-09-23 NOTE — Progress Notes (Signed)
FPTS Interim Progress Note  S: No complaints at this time.  States he has been taking his prep for the colonoscopy tomorrow and has had to have multiple bowel movements due to this as expected, he denies any blood in stool or jet black stools.  He otherwise has no complaints or concerns.  O: BP (!) 128/54 (BP Location: Right Arm)   Pulse (!) 58   Temp 97.6 F (36.4 C) (Temporal)   Resp 16   Ht 5\' 9"  (1.753 m)   Wt 44.5 kg   SpO2 100%   BMI 14.49 kg/m   General: NAD, elderly male, pleasant, able to participate in exam Respiratory: No respiratory distress Skin: warm and dry, no rashes noted Psych: Normal affect and mood   A/P: Patient planning for colonoscopy tomorrow.  He is continuing his prep.  He is not experiencing any jet black stools or blood in his stool at this time.  He otherwise has no concerns or complaints.  Remainder per day team progress note.  Lurline Del, DO 09/23/2020, 7:59 PM PGY-3, [place] Medicine Service pager [phone]

## 2020-09-23 NOTE — Anesthesia Preprocedure Evaluation (Addendum)
Anesthesia Evaluation  Patient identified by MRN, date of birth, ID band Patient awake    Reviewed: Allergy & Precautions, NPO status , Patient's Chart, lab work & pertinent test results  History of Anesthesia Complications (+) PONV and history of anesthetic complications  Airway Mallampati: III  TM Distance: >3 FB Neck ROM: Full    Dental  (+) Edentulous Upper, Edentulous Lower   Pulmonary former smoker,    Pulmonary exam normal        Cardiovascular + CAD  Normal cardiovascular exam+ dysrhythmias Atrial Fibrillation    '19 Carotid US - Right Carotid: Velocities in the right ICA are consistent with a 40-59% stenosis.  Left Carotid: Velocities in the left ICA are consistent with a total occlusion.   '18 TTE - EF 55% to 60%.Grade 2 diastolic dysfunction. Mild AI, trivial MR.    Neuro/Psych  Hx SAH  CVA, No Residual Symptoms negative psych ROS   GI/Hepatic Neg liver ROS, hiatal hernia, GERD  Medicated and Controlled, S/p right colectomy for colon cancer    Endo/Other  Hypothyroidism   Renal/GU CRFRenal disease     Musculoskeletal negative musculoskeletal ROS (+)   Abdominal   Peds  Hematology  (+) anemia ,  On eliquis    Anesthesia Other Findings   Reproductive/Obstetrics                            Anesthesia Physical Anesthesia Plan  ASA: 3  Anesthesia Plan: MAC   Post-op Pain Management:    Induction:   PONV Risk Score and Plan: 2 and Propofol infusion and Treatment may vary due to age or medical condition  Airway Management Planned: Nasal Cannula and Natural Airway  Additional Equipment: None  Intra-op Plan:   Post-operative Plan:   Informed Consent: I have reviewed the patients History and Physical, chart, labs and discussed the procedure including the risks, benefits and alternatives for the proposed anesthesia with the patient or authorized representative who  has indicated his/her understanding and acceptance.   Patient has DNR.  Suspend DNR and Discussed DNR with patient.     Plan Discussed with: CRNA and Anesthesiologist  Anesthesia Plan Comments:       Anesthesia Quick Evaluation

## 2020-09-23 NOTE — Progress Notes (Signed)
Initial Nutrition Assessment  DOCUMENTATION CODES:  Severe malnutrition in context of chronic illness  INTERVENTION:  Will await SLP recommendations to provide further nutrition recommendations.   NUTRITION DIAGNOSIS:  Severe Malnutrition related to chronic illness (cancer) as evidenced by severe muscle depletion, severe fat depletion, percent weight loss.  GOAL:  Patient will meet greater than or equal to 90% of their needs  MONITOR:  Weight trends, Labs, I & O's, Diet advancement  REASON FOR ASSESSMENT:  Consult Assessment of nutrition requirement/status  ASSESSMENT:  Pt presented with speech difficulty and symptomatic anemia with hgb of 5.5. PMH significant for colon cancer, SAH, CVA, HLD, CKD stage IV, GERD, IDA and urinary retention with indwelling Foley catheter. 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  Pt to have EGD today and swallow study as pt reports increasing difficulty with swallowing foods over the last few weeks. Pt currently NPO for EGD, so SLP unable to evaluate pt to determine diet recommendations. SLP following. Will await SLP recommendations to provide further nutrition recommendations.   Reviewed weight history. Pt weighed 52 kg on 03/25/20 and now weighs 44.5 kg on 9/08. This indicates a clinically significant 14.4% weight loss over 6 month period.   UOP: 463ml x24 hours I/O: +438ml since admit  Medications: protonix, mvi with minerals Labs reviewed.    NUTRITION - FOCUSED PHYSICAL EXAM: Flowsheet Row Most Recent Value  Orbital Region Severe depletion  Upper Arm Region Severe depletion  Thoracic and Lumbar Region Severe depletion  Buccal Region Severe depletion  Temple Region Severe depletion  Clavicle Bone Region Severe depletion  Clavicle and Acromion Bone Region Severe depletion  Scapular Bone Region Severe depletion  Dorsal Hand Severe depletion  Patellar Region Severe  depletion  Anterior Thigh Region Severe depletion  Posterior Calf Region Severe depletion  Edema (RD Assessment) None  Hair Reviewed  Eyes Reviewed  Mouth Reviewed  Skin Reviewed  Nails Reviewed      Diet Order:   Diet Order             Diet NPO time specified  Diet effective 0500 tomorrow                  EDUCATION NEEDS:  No education needs have been identified at this time  Skin:  Skin Assessment: Reviewed RN Assessment  Last BM:  9/8  Height:  Ht Readings from Last 1 Encounters:  09/23/20 5\' 9"  (1.753 m)   Weight:  Wt Readings from Last 10 Encounters:  09/23/20 44.5 kg  09/10/20 45.2 kg  09/02/20 44.9 kg  08/13/20 49.3 kg  08/11/20 50.2 kg  07/29/20 48.1 kg  07/01/20 51 kg  05/10/20 51.3 kg  04/26/20 52.1 kg  03/25/20 52 kg   BMI:  Body mass index is 14.49 kg/m.  Estimated Nutritional Needs:  Kcal:  1550-1750 Protein:  75-90 grams Fluid:  >1.55L/d    Larkin Ina, MS, RD, LDN (she/her/hers) RD pager number and weekend/on-call pager number located in [place].

## 2020-09-23 NOTE — Op Note (Addendum)
[facility] Patient Name: Keith [name] Procedure Date : 09/23/2020 MRN: [id number] Attending MD: [name] , MD Date of Birth: July 20, 1940 CSN: [id number] Age: 80 Admit Type: Inpatient Procedure:                Upper GI endoscopy Indications:              Dysphagia, symptomatic acute on chronic anemia. Providers:                [name], MD, Dulcy Fanny, Cherylynn Ridges,                            Technician Referring MD:              Medicines:                Monitored Anesthesia Care Complications:            No immediate complications. Estimated Blood Loss:     Estimated blood loss: none. Procedure:                Pre-Anesthesia Assessment:                           - Prior to the procedure, a History and Physical                            was performed, and patient medications and                            allergies were reviewed. The patient's tolerance of                            previous anesthesia was also reviewed. The risks                            and benefits of the procedure and the sedation                            options and risks were discussed with the patient.                            All questions were answered, and informed consent                            was obtained. Prior Anticoagulants: The patient has                            taken Eliquis (apixaban), last dose was 2 days                            prior to procedure. ASA Grade Assessment: III - A                            patient with severe systemic disease. After  reviewing the risks and benefits, the patient was                            deemed in satisfactory condition to undergo the                            procedure.                           After obtaining informed consent, the endoscope was                            passed under direct vision. Throughout the                            procedure, the patient's blood pressure,  pulse, and                            oxygen saturations were monitored continuously. The                            [id number]-H190 ([id number]) Olympus endoscope was introduced                            through the mouth, and advanced to the second part                            of duodenum. The upper GI endoscopy was                            accomplished without difficulty. The patient                            tolerated the procedure well. Scope In: Scope Out: Findings:      One benign-appearing, intrinsic moderate (circumferential scarring or       stenosis; an endoscope may pass) stenosis was found 32 cm from the       incisors at [employer]. This stenosis measured 1 cm (inner diameter).       The stenosis was traversed. A TTS dilator was passed through the scope.       Dilation with a 12-13.5-15 mm balloon dilator was performed to 15 mm.       The dilation site was examined and showed mild mucosal disruption and       moderate improvement in luminal narrowing. Estimated blood loss: none.      A 4 cm hiatal hernia was present.      A few localized 2 mm erosions with no bleeding and no stigmata of recent       bleeding were found in the gastric antrum. Biopsies were taken with a       cold forceps for histology.      The examined duodenum was normal. Impression:               - Benign-appearing esophageal stenosis. Dilated.                           -  4 cm hiatal hernia.                           - Erosive gastropathy with no bleeding and no                            stigmata of recent bleeding. Biopsied. Recommendation:           - Clear liquid diet.                           - Continue present medications including Protonix                            40 mg p.o. once a day.                           - Await pathology results.                           - Proceed with colonoscopy in AM.                           - The findings and recommendations were discussed                             with the patient's family. Procedure Code(s):        --- Professional ---                           [phone], Esophagogastroduodenoscopy, flexible,                            transoral; with transendoscopic balloon dilation of                            esophagus (less than 30 mm diameter)                           [id number], 59, Esophagogastroduodenoscopy, flexible,                            transoral; with biopsy, single or multiple Diagnosis Code(s):        --- Professional ---                           K22.2, Esophageal obstruction                           K44.9, Diaphragmatic hernia without obstruction or                            gangrene                           K31.89, Other diseases of stomach and duodenum  R13.10, Dysphagia, unspecified CPT copyright 2019 American Medical Association. All rights reserved. The codes documented in this report are preliminary and upon coder review may  be revised to meet current compliance requirements. [name], MD 09/23/2020 2:09:45 PM This report has been signed electronically. Number of Addenda: 0

## 2020-09-23 NOTE — Transfer of Care (Signed)
Immediate Anesthesia Transfer of Care Note  Patient: Keith [name]  Procedure(s) Performed: ESOPHAGOGASTRODUODENOSCOPY (EGD) WITH PROPOFOL Balloon dilation wire-guided BIOPSY  Patient Location: PACU  Anesthesia Type:MAC  Level of Consciousness: awake, alert  and oriented  Airway & Oxygen Therapy: Patient Spontanous Breathing  Post-op Assessment: Report given to RN, Post -op Vital signs reviewed and stable and Patient moving all extremities  Post vital signs: Reviewed and stable  Last Vitals:  Vitals Value Taken Time  BP 92/35 09/23/20 1411  Temp    Pulse 61 09/23/20 1414  Resp 15 09/23/20 1414  SpO2 100 % 09/23/20 1414  Vitals shown include unvalidated device data.  Last Pain:  Vitals:   09/23/20 1141  TempSrc: Temporal  PainSc: 0-No pain         Complications: No notable events documented.

## 2020-09-23 NOTE — Interval H&P Note (Signed)
History and Physical Interval Note:  09/23/2020 1:25 PM  Keith [name]  has presented today for surgery, with the diagnosis of Anemia.  FOBT positive.  Chronic Eliquis reversed with Kcentra.  08/06/2020 robotic right colectomy to address colon cancer.  Dysphagia to solids..  The various methods of treatment have been discussed with the patient and family. After consideration of risks, benefits and other options for treatment, the patient has consented to  Procedure(s): ESOPHAGOGASTRODUODENOSCOPY (EGD) WITH PROPOFOL (N/A) SAVORY DILATION (N/A) as a surgical intervention.  The patient's history has been reviewed, patient examined, no change in status, stable for surgery.  I have reviewed the patient's chart and labs.  Questions were answered to the patient's satisfaction.     [name]

## 2020-09-23 NOTE — Anesthesia Preprocedure Evaluation (Addendum)
Anesthesia Evaluation  Patient identified by MRN, date of birth, ID band Patient awake    Reviewed: Allergy & Precautions, NPO status , Patient's Chart, lab work & pertinent test results  History of Anesthesia Complications (+) PONV and history of anesthetic complications  Airway Mallampati: III  TM Distance: >3 FB Neck ROM: Full    Dental no notable dental hx. (+) Edentulous Upper, Edentulous Lower   Pulmonary former smoker,    Pulmonary exam normal breath sounds clear to auscultation       Cardiovascular + CAD  Normal cardiovascular exam+ dysrhythmias Atrial Fibrillation  Rhythm:Regular Rate:Normal   '19 Carotid US - Right Carotid: Velocities in the right ICA are consistent with a 40-59% stenosis.  Left Carotid: Velocities in the left ICA are consistent with a total occlusion.   '18 TTE - EF 55% to 60%.Grade 2 diastolic dysfunction. Mild AI, trivial MR.    Neuro/Psych  Hx SAH  CVA, No Residual Symptoms negative psych ROS   GI/Hepatic Neg liver ROS, hiatal hernia, GERD  Medicated and Controlled, S/p right colectomy for colon cancer    Endo/Other  Hypothyroidism   Renal/GU CRFRenal disease     Musculoskeletal negative musculoskeletal ROS (+)   Abdominal   Peds  Hematology  (+) Blood dyscrasia, anemia ,  On eliquis    Anesthesia Other Findings   Reproductive/Obstetrics                            Anesthesia Physical Anesthesia Plan  ASA: 3  Anesthesia Plan: MAC   Post-op Pain Management:    Induction:   PONV Risk Score and Plan: Treatment may vary due to age or medical condition  Airway Management Planned: Natural Airway and Nasal Cannula  Additional Equipment: None  Intra-op Plan:   Post-operative Plan:   Informed Consent: I have reviewed the patients History and Physical, chart, labs and discussed the procedure including the risks, benefits and alternatives for  the proposed anesthesia with the patient or authorized representative who has indicated his/her understanding and acceptance.     Dental advisory given  Plan Discussed with: CRNA  Anesthesia Plan Comments:        Anesthesia Quick Evaluation

## 2020-09-23 NOTE — Anesthesia Postprocedure Evaluation (Signed)
Anesthesia Post Note  Patient: Keith [name]  Procedure(s) Performed: ESOPHAGOGASTRODUODENOSCOPY (EGD) WITH PROPOFOL Balloon dilation wire-guided BIOPSY     Patient location during evaluation: PACU Anesthesia Type: MAC Level of consciousness: awake and alert Pain management: pain level controlled Vital Signs Assessment: post-procedure vital signs reviewed and stable Respiratory status: spontaneous breathing, nonlabored ventilation, respiratory function stable and patient connected to nasal cannula oxygen Cardiovascular status: stable and blood pressure returned to baseline Postop Assessment: no apparent nausea or vomiting Anesthetic complications: no   No notable events documented.  Last Vitals:  Vitals:   09/23/20 1425 09/23/20 1428  BP: (!) 101/40 (!) 107/35  Pulse: (!) 59 (!) 55  Resp: 16 13  Temp:    SpO2: 100% 100%    Last Pain:  Vitals:   09/23/20 1408  TempSrc:   PainSc: 0-No pain                 Eryka Dolinger S

## 2020-09-23 NOTE — TOC Initial Note (Addendum)
Transition of Care [facility]) - Initial/Assessment Note    Patient Details  Name: Keith [name] MRN: [id number] Date of Birth: 11-14-1940  [facility] CM/SW Contact:    Tom-Johnson, Renea Ee, RN Phone Number: 09/23/2020, 2:57 PM  Clinical Narrative:                 CM spoke with patient at bedside for post hospital transition needs. Admitted for symptomatic anemia with Hgb at 5.5.2 units PRBC given and Hgb currently is 7.9. Patient states he feels better now than when he first arrived. States he lives with his grand daughter. Son, who was at the bedside checks upon him frequently and takes him to and from his appointments. Has a walker, cane, bedside commode at home. [facility] with [facility]  for PT/OT/RN/ST services. No TOC needs at this time. Will continue to follow with needs.    Barriers to Discharge: Continued Medical Work up   Patient Goals and CMS Choice Patient states their goals for this hospitalization and ongoing recovery are:: To go home CMS Medicare.gov Compare Post Acute Care list provided to:: Patient    Expected Discharge Plan and Services     Discharge Planning Services: CM Consult   Living arrangements for the past 2 months: Single Family Home                                      Prior Living Arrangements/Services Living arrangements for the past 2 months: Single Family Home Lives with:: Adult Children (Son) Patient language and need for interpreter reviewed:: Yes Do you feel safe going back to the place where you live?: Yes      Need for Family Participation in Patient Care: Yes (Comment) Care giver support system in place?: Yes (comment) Current home services: DME, Home OT, Home PT, Home RN, Homehealth aide (Walker, cnae, bedside commode.) Criminal Activity/Legal Involvement Pertinent to Current Situation/Hospitalization: No - Comment as needed  Activities of Daily Living Home Assistive Devices/Equipment: Eyeglasses, Environmental consultant  (specify type) ADL Screening (condition at time of admission) Patient's cognitive ability adequate to safely complete daily activities?: Yes Is the patient deaf or have difficulty hearing?: No Does the patient have difficulty seeing, even when wearing glasses/contacts?: No Does the patient have difficulty concentrating, remembering, or making decisions?: No Patient able to express need for assistance with ADLs?: Yes Does the patient have difficulty dressing or bathing?: No Independently performs ADLs?: Yes (appropriate for developmental age)  Permission Sought/Granted Permission sought to share information with : Case Freight forwarder, Chartered certified accountant granted to share information with : Yes, Verbal Permission Granted              Emotional Assessment Appearance:: Appears stated age Attitude/Demeanor/Rapport: Engaged Affect (typically observed): Accepting, Appropriate, Hopeful Orientation: : Oriented to Self, Oriented to Place, Oriented to  Time, Oriented to Situation Alcohol / Substance Use: Not Applicable Psych Involvement: No (comment)  Admission diagnosis:  Acute blood loss anemia [D62] Symptomatic anemia [D64.9] Difficulty with speech [R47.9] Patient Active Problem List   Diagnosis Date Noted   Benign esophageal stricture    Acute blood loss anemia 09/22/2020   Difficulty with speech    Orthostatic hypotension 09/05/2020   Internal hernia 08/13/2020   Encounter for assessment for small bowel obstruciton 08/13/2020   Protein-calorie malnutrition, severe 08/11/2020   Cancer of transverse colon ([place]) 08/06/2020   Coronary artery calcification 05/25/2020  Adenocarcinoma ([place]) 05/22/2019   Mass of colon    Symptomatic anemia 05/08/2019   Gastroesophageal reflux disease 02/18/2019   Iron deficiency 09/26/2018   Anemia in stage 4 chronic kidney disease ([place]) 05/21/2018   Vitamin D deficiency 05/21/2018   History of CVA (cerebrovascular accident)  01/31/2018   TSH elevation 01/28/2018   Underweight 01/24/2018   CKD (chronic kidney disease) stage 4, GFR 15-29 ml/min (HCC) 12/10/2017   Hypotension 12/10/2017   Paroxysmal atrial fibrillation ([place]) 11/07/2017   Bilateral carotid artery stenosis 09/03/2017   Malignant melanoma of skin of scalp ([place]) 03/26/2017   Hyperlipidemia 02/08/2017   SAH (subarachnoid hemorrhage) ([place]) 11/20/2016   Cerebrovascular accident (CVA) due to occlusion of left carotid artery ([place]) 11/20/2016   PCP:  Rise Patience, DO Pharmacy:   [id number] - [place], [place] [address] Phone: [phone] Fax: [phone]  [place] [address] Phone: [phone] Fax: [phone]     Social Determinants of Health (SDOH) Interventions    Readmission Risk Interventions Readmission Risk Prevention Plan 08/18/2020  Transportation Screening Complete  PCP or Specialist Appt within 3-5 Days Complete  HRI or [place] Complete  Social Work Consult for [place] Planning/Counseling Complete  Palliative Care Screening Not Applicable  Medication Review Press photographer) Complete  Some recent data might be hidden

## 2020-09-23 NOTE — Progress Notes (Signed)
SLP Cancellation Note  Patient Details Name: Keith [name] MRN: [id number] DOB: Dec 28, 1940   Cancelled treatment:        Attempted to see pt for clinical evaluation of swallowing. PT is NPO at present for EGD later today.  Will reattempt as SLP schedule permits, when pt is medically appropriate for POs.   Celedonio Savage, [place], [place] Office: [phone]  09/23/2020, 9:44 AM

## 2020-09-23 NOTE — Progress Notes (Signed)
FPTS Interim Progress Note Contacted Dr. Alen Blew, Mr[facility] oncologist regarding MR brain results with sclerosis. He does not recommend any further work up at this time as he has had active surveillance of melanoma site q9m and does not suspect metastatic spread.  Gladys Damme, MD 09/23/2020, 3:37 PM PGY-3, [place] [facility] Service pager [phone]

## 2020-09-23 NOTE — Progress Notes (Signed)
FPTS Interim Progress Note  Spoke with Dr. Ronnald Ramp, radiologist, regarding most recent MRI w/o contrast. Recommends MRI with contrast follow up imaging to further evaluate suspicious area. Verified that it is appropriate to order this imaging with contrast in spite of Cr 2.15 AKI in the setting of CKD stage 4 as he explained newer agents do not have a minimum threshold. Dr. Ronnald Ramp also recommends contacting Mr[facility] outpatient oncologist for further clarification and update. Follow up MRI imaging ordered, greatly appreciate recommendations and expertise of Dr. Ronnald Ramp.   Donney Dice, DO 09/23/2020, 3:04 PM PGY-2, [place] [facility] Service pager [phone]

## 2020-09-23 NOTE — Progress Notes (Signed)
This chaplain responded to the spiritual consult for creating/updating the Pt. Advance Directive.  The chaplain understands the Pt. has a Forensic scientist in [place].  However, the document is not readable.  The Pt. verbally indicated his son-Steven Carrender is his HCPOA and the Pt. would not want any life prolonging measures in a life ending situation.   This chaplain updated the Pt. RN-Amber on the challenge opening the Pt. AD.  The chaplain was able the document in the media tab and the Pt. Advance Directive dated 01/24/2018 agreed with the Pt. verbal statements.

## 2020-09-23 NOTE — Hospital Course (Addendum)
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 Came to the ED for dysarthria, which resolved on admission. Patient reported recent melena. Found to have a hgb of 5.5. S/P 2U PRBCs, Hgb 7.9 on recheck. Eliquis for AF reversed. Started on IV Protonix. GI consulted, performed an EGD and colonoscopy, neither of which showed an obvious source of bleeding. Biopsies were obtained during both procedures, pending at time of D/C***. After these procedures, GI recommended restarting home Eliquis for Afib 24 hours after the procedure and return to the GI office as needed. His Hgb at discharge was***   Paroxysmal AF Home meds include eliquis, requiring reversal due to GI bleed.  EKG on admission normal sinus rhythm. Telemetry was unremarkable during the admission.    Hx CVA  speech difficulty (resolved)  Symptoms resolved on admission. Head CT with remote left insular cortex infarct without acute intracranial findings or mass lesions. MRI brain with no concern for acute infarct. (See below for other findings). Patient alert and oriented with 5/5 strength on exam of bilateral upper and lower extremities.    MRI incidental findings Sclerosis of the calvarium c/f malignancy or post treatment changes (pt had a melanoma excised at this location). Radiology recommending contrasted MRI study, however, outpatient oncologist was contacted and he did not think this was necessary. MRI w contrast not obtained.    Indwelling Foley Catheter Foley placed  for urinary retention during hospitalization in July 2022 for SBO. Patient referred to follow with [facility]. During this admission, foley did not need to be re-placed. No evidence of hematuria, acceptable UOP throughout admit.    Dysphagia Patient reported increasing difficulty with swallowing sandwich meat and rice over the last few weeks. His swallow study***   Malnutrition 30  lb weight loss recently in the context of cancer. Appears cachectic. Nutrition was consulted and did not provide recs until after colonoscopy and EGD were completed***   CKD, Stage 4 Creatinine 2.17 on admission. Appears to be near patient's baseline.  Creatinine has ranged from 1.98-2.5 since Jan 2022. Follows with nephrology as outpatient in [facility] system. Cr stayed within this range during hospitalization.   Issues for follow up: -Held midodrine on discharge, consider restarting as appropriate -Held iron supplementation on discharge, consider restarting if needed.

## 2020-09-23 NOTE — Anesthesia Procedure Notes (Signed)
Procedure Name: MAC Date/Time: 09/23/2020 1:43 PM Performed by: Amadeo Garnet, CRNA Pre-anesthesia Checklist: Patient identified, Emergency Drugs available, Suction available and Patient being monitored Patient Re-evaluated:Patient Re-evaluated prior to induction Oxygen Delivery Method: Nasal cannula Induction Type: IV induction Placement Confirmation: positive ETCO2 Dental Injury: Teeth and Oropharynx as per pre-operative assessment

## 2020-09-24 ENCOUNTER — Encounter (HOSPITAL_COMMUNITY): Payer: Self-pay | Admitting: Gastroenterology

## 2020-09-24 ENCOUNTER — Encounter (HOSPITAL_COMMUNITY)
Admission: EM | Disposition: A | Discharge status: Home Health | Payer: Self-pay | Source: Home / Self Care | Attending: Family Medicine

## 2020-09-24 ENCOUNTER — Inpatient Hospital Stay (HOSPITAL_COMMUNITY): Payer: Medicare Other | Admitting: Anesthesiology

## 2020-09-24 DIAGNOSIS — K6289 Other specified diseases of anus and rectum: Secondary | ICD-10-CM | POA: Diagnosis not present

## 2020-09-24 DIAGNOSIS — K648 Other hemorrhoids: Secondary | ICD-10-CM

## 2020-09-24 DIAGNOSIS — D125 Benign neoplasm of sigmoid colon: Secondary | ICD-10-CM

## 2020-09-24 DIAGNOSIS — D509 Iron deficiency anemia, unspecified: Secondary | ICD-10-CM

## 2020-09-24 DIAGNOSIS — Z980 Intestinal bypass and anastomosis status: Secondary | ICD-10-CM

## 2020-09-24 DIAGNOSIS — D62 Acute posthemorrhagic anemia: Secondary | ICD-10-CM | POA: Diagnosis not present

## 2020-09-24 DIAGNOSIS — K635 Polyp of colon: Secondary | ICD-10-CM

## 2020-09-24 DIAGNOSIS — K5730 Diverticulosis of large intestine without perforation or abscess without bleeding: Secondary | ICD-10-CM

## 2020-09-24 HISTORY — PX: POLYPECTOMY: SHX5525

## 2020-09-24 HISTORY — PX: BIOPSY: SHX5522

## 2020-09-24 HISTORY — PX: COLONOSCOPY WITH PROPOFOL: SHX5780

## 2020-09-24 LAB — BASIC METABOLIC PANEL
Anion gap: 10 (ref 5–15)
BUN: 23 mg/dL (ref 8–23)
CO2: 20 mmol/L — ABNORMAL LOW (ref 22–32)
Calcium: 9.3 mg/dL (ref 8.9–10.3)
Chloride: 111 mmol/L (ref 98–111)
Creatinine, Ser: 1.99 mg/dL — ABNORMAL HIGH (ref 0.61–1.24)
GFR, Estimated: 34 mL/min — ABNORMAL LOW (ref >60)
Glucose, Bld: 93 mg/dL (ref 70–99)
Potassium: 4.2 mmol/L (ref 3.5–5.1)
Sodium: 141 mmol/L (ref 135–145)

## 2020-09-24 LAB — CBC
HCT: 26.5 % — ABNORMAL LOW (ref 39.0–52.0)
Hemoglobin: 8.3 g/dL — ABNORMAL LOW (ref 13.0–17.0)
MCH: 29.4 pg (ref 26.0–34.0)
MCHC: 31.3 g/dL (ref 30.0–36.0)
MCV: 94 fL (ref 80.0–100.0)
Platelets: 243 10*3/uL (ref 150–400)
RBC: 2.82 MIL/uL — ABNORMAL LOW (ref 4.22–5.81)
RDW: 15.7 % — ABNORMAL HIGH (ref 11.5–15.5)
WBC: 7 10*3/uL (ref 4.0–10.5)
nRBC: 0 % (ref 0.0–0.2)

## 2020-09-24 LAB — CANCER ANTIGEN 19-9: CA 19-9: 11 U/mL (ref 0–35)

## 2020-09-24 LAB — CEA: CEA: 1.2 ng/mL (ref 0.0–4.7)

## 2020-09-24 LAB — SURGICAL PATHOLOGY

## 2020-09-24 LAB — GLUCOSE, CAPILLARY: Glucose-Capillary: 148 mg/dL — ABNORMAL HIGH (ref 70–99)

## 2020-09-24 SURGERY — COLONOSCOPY WITH PROPOFOL
Anesthesia: Monitor Anesthesia Care

## 2020-09-24 MED ORDER — EPHEDRINE SULFATE-NACL 50-0.9 MG/10ML-% IV SOSY
PREFILLED_SYRINGE | INTRAVENOUS | Status: DC | PRN
  Administered 2020-09-24 (×2): 5 mg via INTRAVENOUS

## 2020-09-24 MED ORDER — PROPOFOL 500 MG/50ML IV EMUL
INTRAVENOUS | Status: DC | PRN
  Administered 2020-09-24: 75 ug/kg/min via INTRAVENOUS

## 2020-09-24 MED ORDER — PROPOFOL 10 MG/ML IV BOLUS
INTRAVENOUS | Status: DC | PRN
  Administered 2020-09-24: 20 mg via INTRAVENOUS

## 2020-09-24 MED ORDER — LIDOCAINE 2% (20 MG/ML) 5 ML SYRINGE
INTRAMUSCULAR | Status: DC | PRN
  Administered 2020-09-24: 60 mg via INTRAVENOUS

## 2020-09-24 MED ORDER — SODIUM CHLORIDE 0.9 % IV SOLN
INTRAVENOUS | Status: DC
  Administered 2020-09-24: 500 mL via INTRAVENOUS

## 2020-09-24 MED ORDER — PHENYLEPHRINE 40 MCG/ML (10ML) SYRINGE FOR IV PUSH (FOR BLOOD PRESSURE SUPPORT)
PREFILLED_SYRINGE | INTRAVENOUS | Status: DC | PRN
  Administered 2020-09-24 (×3): 80 ug via INTRAVENOUS

## 2020-09-24 SURGICAL SUPPLY — 21 items

## 2020-09-24 NOTE — Op Note (Signed)
[facility] Patient Name: Keith [name] Procedure Date : 09/24/2020 MRN: [id number] Attending MD: [name] , MD Date of Birth: 05-07-1940 CSN: [id number] Age: 80 Admit Type: Inpatient Procedure:                Colonoscopy Indications:              Unexplained iron deficiency anemia with H + stools.                            H/O colon cancer s/p R hemicolectomy. Providers:                [name], MD, Particia Nearing, RN, Tyna Jaksch                            Technician Referring MD:              Medicines:                Monitored Anesthesia Care Complications:            No immediate complications. Estimated Blood Loss:     Estimated blood loss: none. Procedure:                Pre-Anesthesia Assessment:                           - Prior to the procedure, a History and Physical                            was performed, and patient medications and                            allergies were reviewed. The patient's tolerance of                            previous anesthesia was also reviewed. The risks                            and benefits of the procedure and the sedation                            options and risks were discussed with the patient.                            All questions were answered, and informed consent                            was obtained. Prior Anticoagulants: The patient has                            taken Eliquis (apixaban), last dose was 3 days                            prior to procedure. ASA Grade Assessment: III - A  patient with severe systemic disease. After                            reviewing the risks and benefits, the patient was                            deemed in satisfactory condition to undergo the                            procedure.                           After obtaining informed consent, the colonoscope                            was passed under direct vision. Throughout the                             procedure, the patient's blood pressure, pulse, and                            oxygen saturations were monitored continuously. The                            [id number]-HQ190L ([id number]) Olympus colonoscope was                            introduced through the anus and advanced to the the                            ileocolonic anastomosis. The colonoscopy was                            performed without difficulty. The patient tolerated                            the procedure well. The quality of the bowel                            preparation was good. The neo-terminal ileum,                            ileocolic anastomosis and rectum were photographed. Scope In: 2:12:03 PM Scope Out: 2:26:40 PM Total Procedure Duration: 0 hours 14 minutes 37 seconds  Findings:      A 4 mm polyp was found in the mid sigmoid colon with surrounding tattoo,       25 cm from the anal verge. The polyp was sessile. The polyp was removed       with a cold snare. Resection and retrieval were complete.      A few small-mouthed diverticula were found in the sigmoid colon.      There was evidence of a prior end-to-side ileo-colonic anastomosis in       the ascending colon. This was patent and was characterized by erosions,       erythema and granulation  tissue. Multiple biopsies were taken and sent       for histology. One clip was loose which was removed with a cold biopsy       forceps. No recurrence of colon cancer.      The neo-terminal ileum appeared normal. Good 10 to 15 cm neo-TI was       intubated.      Non-bleeding internal hemorrhoids were found during retroflexion. The       hemorrhoids were moderate. Also healed posterior anal fissure was noted       on rectal exam.      The exam was otherwise without abnormality. Impression:               - One 4 mm polyp in the mid sigmoid colon, removed                            with a cold snare. Resected and retrieved.                           -  Minimal sigmoid diverticulosis.                           - Patent end-to-side ileo-colonic anastomosis,                            characterized by erosion and erythema. Biopsied. No                            recurrence.                           - The examined portion of the neo-ileum was normal.                           - Non-bleeding internal hemorrhoids. Recommendation:           - Return patient to hospital ward for ongoing care.                           - Resume regular diet.                           - Continue present medications.                           - Await pathology results.                           - Repeat colonoscopy is not recommended for                            surveillance. Hence repeat colonoscopy only if with                            new problems.                           - If needed, can start Eliquis in 24  hours.                           - Return to GI office PRN. Procedure Code(s):        --- Professional ---                           [phone], Colonoscopy, flexible; with removal of                            tumor(s), polyp(s), or other lesion(s) by snare                            technique                           45380, 50, Colonoscopy, flexible; with biopsy,                            single or multiple Diagnosis Code(s):        --- Professional ---                           K63.5, Polyp of colon                           K64.8, Other hemorrhoids                           Z98.0, Intestinal bypass and anastomosis status                           D50.9, Iron deficiency anemia, unspecified                           K57.30, Diverticulosis of large intestine without                            perforation or abscess without bleeding CPT copyright 2019 American [employer] Association. All rights reserved. The codes documented in this report are preliminary and upon coder review may  be revised to meet current compliance requirements. [name],  MD 09/24/2020 2:59:49 PM This report has been signed electronically. Number of Addenda: 0

## 2020-09-24 NOTE — Plan of Care (Signed)
  Problem: Nutrition: Goal: Adequate nutrition will be maintained Outcome: Progressing   Problem: Bowel/Gastric: Goal: Will show no signs and symptoms of gastrointestinal bleeding Outcome: Progressing

## 2020-09-24 NOTE — Anesthesia Procedure Notes (Signed)
Procedure Name: MAC Date/Time: 09/24/2020 2:00 PM Performed by: Trinna Post., CRNA Pre-anesthesia Checklist: Patient identified, Emergency Drugs available, Suction available, Patient being monitored and Timeout performed Patient Re-evaluated:Patient Re-evaluated prior to induction Oxygen Delivery Method: Nasal cannula Preoxygenation: Pre-oxygenation with 100% oxygen Induction Type: IV induction Placement Confirmation: positive ETCO2

## 2020-09-24 NOTE — Evaluation (Signed)
SLP Cancellation Note  Patient Details Name: Keith [name] MRN: [id number] DOB: [birth year]   Cancelled treatment:       Reason Eval/Treat Not Completed: Per RN, pt NPO for colonoscopy scheduled for this afternoon. SLP will continue f/u for completion of bedside swallow eval.    Ellwood Dense, [place], [place] Office Number: [place] 09/24/2020, 9:59 AM

## 2020-09-24 NOTE — Transfer of Care (Signed)
Immediate Anesthesia Transfer of Care Note  Patient: Keith [name]  Procedure(s) Performed: COLONOSCOPY WITH PROPOFOL BIOPSY POLYPECTOMY  Patient Location: PACU and Endoscopy Unit  Anesthesia Type:MAC  Level of Consciousness: awake, alert  and oriented  Airway & Oxygen Therapy: Patient Spontanous Breathing and Patient connected to nasal cannula oxygen  Post-op Assessment: Report given to RN and Post -op Vital signs reviewed and stable  Post vital signs: Reviewed and stable  Last Vitals:  Vitals Value Taken Time  BP 165/44 09/24/20 1442  Temp 36.2 C 09/24/20 1433  Pulse 64 09/24/20 1443  Resp 16 09/24/20 1443  SpO2 100 % 09/24/20 1443  Vitals shown include unvalidated device data.  Last Pain:  Vitals:   09/24/20 1442  TempSrc:   PainSc: 0-No pain         Complications: No notable events documented.

## 2020-09-24 NOTE — Interval H&P Note (Signed)
History and Physical Interval Note:  09/24/2020 1:54 PM  Keith [name]  has presented today for surgery, with the diagnosis of Anemia.  The various methods of treatment have been discussed with the patient and family. After consideration of risks, benefits and other options for treatment, the patient has consented to  Procedure(s): COLONOSCOPY WITH PROPOFOL (N/A) as a surgical intervention.  The patient's history has been reviewed, patient examined, no change in status, stable for surgery.  I have reviewed the patient's chart and labs.  Questions were answered to the patient's satisfaction.     [name]

## 2020-09-24 NOTE — Progress Notes (Signed)
SLP Cancellation Note  Patient Details Name: MARIK SEDORE MRN: [id number] DOB: 1940/12/23   Cancelled treatment:       Reason Eval/Treat Not Completed: Spoke with DO and clarified that SLP is unable to complete MBS (per order) today or over the weekend due to scheduling/radiology availability. Plan is for completion on Monday (9/12) schedule/radiology permitting.     Ellwood Dense, [place], [place] [facility] Office Number: [phone]   Acie Fredrickson 09/24/2020, 3:53 PM

## 2020-09-24 NOTE — Progress Notes (Signed)
Family Medicine Teaching Service Daily Progress Note Intern Pager: [phone]  Patient name: Keith [name] Medical record number: [id number] Date of birth: 04/27/1940 Age: 80 y.o. Gender: male  Primary Care Provider: Rise Patience, DO Consultants: GI Code Status: DNR  Pt Overview and Major Events to Date:  9/7: admitted 9/8: EGD, no bleeding source found 9/9: colonoscopy scheduled  Assessment and Plan: Patient is a 80 year old male presenting with speech difficulty and found to have symptomatic anemia with a hemoglobin of 5.5.  Past medical history significant for paroxysmal A. fib, colon cancer, SAH, CVA, HLD, CKD stage IV, GERD, IDA and urinary retention with indwelling Foley catheter.  Symptomatic anemia, history of colon cancer Patient status post surgery in July for resection of adenocarcinoma the transverse colon.  Came to the ED for dysarthria which is since resolved.  Reported melena.  Found to have hemoglobin of 5.5.  Now status post 2 units packed red blood cells, hemoglobin 7.9 on recheck.  Eliquis reversed.  GI consulted, EGD performed yesterday, with dilation and biopsy. No source of bleeding found. Hemoglobin 8.6->8.3 this AM. - GI consult -Follow-up biopsy results - Colonoscopy this morning, patient n.p.o. since midnight and given bowel prep. - Transfuse to hemoglobin above 7, per GI recs -- Recheck hemoglobin tomorrow a.m. or this afternoon depending on colonoscopy results -- IV Protonix 40 mg twice daily -SCDs for DVT prophylaxis - PT/OT  History of CVA, speech difficulty (resolved) MRI brain with no concern for acute infarct.  Neuro exam nonfocal. Symptoms likely due to anemia. Now resolved with correction of hgb.  -Monitor for changes in mental status  MRI incidental findings Sclerosis of the calvarium concerning for malignancy or posttreatment changes (patient had a melanoma excised in this location).  Radiology recommended contrasted MRI study for follow-up,  however, outpatient oncologist contacted and recommends no further work-up. -No further work-up needed  Indwelling Foley catheter Foley placed for urinary retention during hospitalization of July 2022 for small bowel obstruction.  Patient referred to follow-up with [facility].  Urine output 0.8 mL/kg/h. -Continue to monitor urine output  Dysphagia Patient reporting increased difficulty with swallowing sandwich meat and rice over the last few weeks.  He has an outpatient SLP evaluation for swallow study close to his Urology f/u.  -Complete swallow study while inpatient, order in  Malnutrition 30 pound weight loss recently in the context of cancer - RD consult, they are awaiting SLP recommendations  CKD 4 Baseline creatinine of 1.98-2.5.  Creatinine of 1.99 this morning.  GERD - IV Protonix while admitted  FEN/GI: CLD, until SLP eval Prophylaxis: SCDs due to active bleeding  Dispo: Awaiting PT/OT Recs  Subjective:  On interview this morning patient is in good spirits.  He is fully alert and oriented. He denies any pain.  He reports almost continuous bowel movements overnight due to colonoscopy prep.  Review of systems negative for chest pain or shortness of breath.  Objective: Temp:  [97.4 F (36.3 C)-97.9 F (36.6 C)] 97.4 F (36.3 C) (09/09 0435) Pulse Rate:  [55-67] 58 (09/09 0435) Resp:  [13-18] 18 (09/09 0435) BP: (92-147)/(29-65) 110/65 (09/09 0435) SpO2:  [98 %-100 %] 100 % (09/09 0435) Weight:  [44.5 kg] 44.5 kg (09/08 1141) Physical Exam Vitals reviewed.  Cardiovascular:     Rate and Rhythm: Normal rate and regular rhythm.     Heart sounds: No murmur heard. Pulmonary:     Effort: Pulmonary effort is normal.     Breath sounds: Normal breath sounds.  Abdominal:     General: Bowel sounds are normal.     Palpations: Abdomen is soft.     Tenderness: There is no abdominal tenderness.  Neurological:     Mental Status: He is alert.     Laboratory: Recent  Labs  Lab 09/22/20 0433 09/23/20 0426 09/23/20 1621 09/24/20 0435  WBC 7.8 6.4  --  7.0  HGB 5.5* 7.9* 8.6* 8.3*  HCT 18.9* 25.3* 27.3* 26.5*  PLT 354 256  --  243   Recent Labs  Lab 09/22/20 0433 09/23/20 0426 09/24/20 0435  NA 134* 137 141  K 4.9 4.6 4.2  CL 103 106 111  CO2 23 25 20*  BUN 34* 28* 23  CREATININE 2.17* 2.15* 1.99*  CALCIUM 9.1 9.0 9.3  PROT 6.0* 5.5*  --   BILITOT 0.3 1.0  --   ALKPHOS 62 59  --   ALT 9 8  --   AST 15 13*  --   GLUCOSE 110* 91 93    Imaging/Diagnostic Tests: EGD as above  Corky Sox PGY-1, Psychiatry FPTS Intern pager: [phone], text pages welcome

## 2020-09-24 NOTE — Anesthesia Postprocedure Evaluation (Signed)
Anesthesia Post Note  Patient: Keith [name]  Procedure(s) Performed: COLONOSCOPY WITH PROPOFOL BIOPSY POLYPECTOMY     Patient location during evaluation: Endoscopy Anesthesia Type: MAC Level of consciousness: awake and alert Pain management: pain level controlled Vital Signs Assessment: post-procedure vital signs reviewed and stable Respiratory status: spontaneous breathing, nonlabored ventilation, respiratory function stable and patient connected to nasal cannula oxygen Cardiovascular status: blood pressure returned to baseline and stable Postop Assessment: no apparent nausea or vomiting Anesthetic complications: no   No notable events documented.  Last Vitals:  Vitals:   09/24/20 1442 09/24/20 1448  BP: (!) 165/44 (!) 130/43  Pulse: 60 69  Resp: (!) 21 15  Temp:    SpO2: 100% 100%    Last Pain:  Vitals:   09/24/20 1448  TempSrc:   PainSc: 0-No pain                 Barnet Glasgow

## 2020-09-25 ENCOUNTER — Encounter (HOSPITAL_COMMUNITY): Payer: Self-pay | Admitting: Gastroenterology

## 2020-09-25 DIAGNOSIS — D62 Acute posthemorrhagic anemia: Secondary | ICD-10-CM | POA: Diagnosis not present

## 2020-09-25 LAB — BASIC METABOLIC PANEL
Anion gap: 9 (ref 5–15)
BUN: 19 mg/dL (ref 8–23)
CO2: 18 mmol/L — ABNORMAL LOW (ref 22–32)
Calcium: 8.5 mg/dL — ABNORMAL LOW (ref 8.9–10.3)
Chloride: 111 mmol/L (ref 98–111)
Creatinine, Ser: 1.96 mg/dL — ABNORMAL HIGH (ref 0.61–1.24)
GFR, Estimated: 34 mL/min — ABNORMAL LOW (ref >60)
Glucose, Bld: 101 mg/dL — ABNORMAL HIGH (ref 70–99)
Potassium: 3.9 mmol/L (ref 3.5–5.1)
Sodium: 138 mmol/L (ref 135–145)

## 2020-09-25 LAB — CBC
HCT: 25.5 % — ABNORMAL LOW (ref 39.0–52.0)
Hemoglobin: 7.9 g/dL — ABNORMAL LOW (ref 13.0–17.0)
MCH: 29.4 pg (ref 26.0–34.0)
MCHC: 31 g/dL (ref 30.0–36.0)
MCV: 94.8 fL (ref 80.0–100.0)
Platelets: 238 10*3/uL (ref 150–400)
RBC: 2.69 MIL/uL — ABNORMAL LOW (ref 4.22–5.81)
RDW: 15.9 % — ABNORMAL HIGH (ref 11.5–15.5)
WBC: 5.4 10*3/uL (ref 4.0–10.5)
nRBC: 0 % (ref 0.0–0.2)

## 2020-09-25 LAB — GLUCOSE, CAPILLARY
Glucose-Capillary: 107 mg/dL — ABNORMAL HIGH (ref 70–99)
Glucose-Capillary: 108 mg/dL — ABNORMAL HIGH (ref 70–99)

## 2020-09-25 MED ORDER — APIXABAN 2.5 MG PO TABS
2.5000 mg | ORAL_TABLET | Freq: Two times a day (BID) | ORAL | Status: DC
  Administered 2020-09-25 – 2020-09-26 (×2): 2.5 mg via ORAL
  Filled 2020-09-25 (×2): qty 1

## 2020-09-25 MED ORDER — PANTOPRAZOLE SODIUM 40 MG PO TBEC
40.0000 mg | DELAYED_RELEASE_TABLET | Freq: Two times a day (BID) | ORAL | Status: DC
  Administered 2020-09-25 – 2020-09-26 (×2): 40 mg via ORAL
  Filled 2020-09-25 (×2): qty 1

## 2020-09-25 NOTE — Evaluation (Signed)
Physical Therapy Evaluation Patient Details Name: Keith [name] MRN: [id number] DOB: Mar 23, 1940 Today's Date: 09/25/2020   History of Present Illness  80 y.o. male presents to [facility] ED on 09/22/2020 with speech difficulty, found to have symptomatic anemia with Hgb of 5.5. Pt underwent upper GI endoscopy on 9/8 and colonoscopy on 9/9. PMH includes  pAF, colon cancer, SAH, CVA, HLD, CKD stage IV, GERD, IDA and urinary retention with indwelling Foley catheter.  Clinical Impression  Pt presents to PT with deficits in power, strength, endurance, balance. Pt is able to transfer and ambulate short household distances without physical assistance at this time. Pt will benefit from continued aggressive mobilization and PT services to improve balance and restore independence in mobility. PT recommends discharge home with HHPT.    Follow Up Recommendations Home health PT    Equipment Recommendations  None recommended by PT    Recommendations for Other Services       Precautions / Restrictions Precautions Precautions: Fall Restrictions Weight Bearing Restrictions: No      Mobility  Bed Mobility Overal bed mobility: Modified Independent             General bed mobility comments: HOB elevated    Transfers Overall transfer level: Needs assistance Equipment used: None Transfers: Sit to/from Stand;Stand Pivot Transfers Sit to Stand: Supervision Stand pivot transfers: Supervision          Ambulation/Gait Ambulation/Gait assistance: Supervision Gait Distance (Feet): 60 Feet Assistive device:  (PRN use of furniture when walking by) Gait Pattern/deviations: Step-through pattern Gait velocity: functional Gait velocity interpretation: 1.31 - 2.62 ft/sec, indicative of limited community ambulator General Gait Details: pt with slowed step-through gait, mild increase in lateral drift, no LOB noted  Stairs            Wheelchair Mobility    Modified Rankin (Stroke Patients Only)        Balance Overall balance assessment: Needs assistance Sitting-balance support: No upper extremity supported;Feet supported Sitting balance-[name] Scale: Good     Standing balance support: No upper extremity supported;During functional activity Standing balance-[name] Scale: Good Standing balance comment: supervision                             Pertinent Vitals/Pain Pain Assessment: No/denies pain    Home Living Family/patient expects to be discharged to:: Private residence Living Arrangements: Other relatives (granddaughter) Available Help at Discharge: Family;Available 24 hours/day Type of Home: House Home Access: Stairs to enter Entrance Stairs-Rails: None Entrance Stairs-Number of Steps: 1+1 Home Layout: One level Home Equipment: Shower seat;Bedside commode;Walker - 2 wheels      Prior Function Level of Independence: Needs assistance   Gait / Transfers Assistance Needed: granddaughter provides minG for mobility, pt has been working with HHPT recently since surgery           Hand Dominance   Dominant Hand: Right    Extremity/Trunk Assessment   Upper Extremity Assessment Upper Extremity Assessment: Overall WFL for tasks assessed    Lower Extremity Assessment Lower Extremity Assessment: Generalized weakness    Cervical / Trunk Assessment Cervical / Trunk Assessment: Normal  Communication   Communication: No difficulties  Cognition Arousal/Alertness: Awake/alert Behavior During Therapy: WFL for tasks assessed/performed Overall Cognitive Status: Within Functional Limits for tasks assessed  General Comments General comments (skin integrity, edema, etc.): VSS on RA    Exercises     Assessment/Plan    PT Assessment Patient needs continued PT services  PT Problem List Decreased strength;Decreased activity tolerance;Decreased balance;Decreased mobility       PT Treatment  Interventions DME instruction;Gait training;Stair training;Functional mobility training;Therapeutic activities;Therapeutic exercise;Balance training;Neuromuscular re-education;Patient/family education    PT Goals (Current goals can be found in the Care Plan section)  Acute Rehab PT Goals Patient Stated Goal: to go home PT Goal Formulation: With patient Time For Goal Achievement: 10/09/20 Potential to Achieve Goals: Good Additional Goals Additional Goal #1: Pt will score >19/24 on DGI to indicate reduced risk for falls    Frequency Min 3X/week   Barriers to discharge        Co-evaluation               AM-PAC PT "6 Clicks" Mobility  Outcome Measure Help needed turning from your back to your side while in a flat bed without using bedrails?: None Help needed moving from lying on your back to sitting on the side of a flat bed without using bedrails?: None Help needed moving to and from a bed to a chair (including a wheelchair)?: None Help needed standing up from a chair using your arms (e.g., wheelchair or bedside chair)?: A [place] Help needed to walk in hospital room?: A [place] Help needed climbing 3-5 steps with a railing? : A [place] 6 Click Score: 21    End of Session   Activity Tolerance: Patient tolerated treatment well Patient left: in chair;with call bell/phone within reach;with chair alarm set Nurse Communication: Mobility status PT Visit Diagnosis: Unsteadiness on feet (R26.81);Muscle weakness (generalized) (M62.81)    Time: 3704-8889 PT Time Calculation (min) (ACUTE ONLY): 12 min   Charges:   PT Evaluation $PT Eval Low Complexity: 1 Low          [name], PT, DPT Acute Rehabilitation Pager: [phone]   [name] 09/25/2020, 12:36 PM

## 2020-09-25 NOTE — Progress Notes (Signed)
   09/24/20 2219  Provider Notification  Provider Name/Title Dr. Zigmund Daniel  Date Provider Notified 09/24/20  Time Provider Notified 2219  Notification Type Page  Notification Reason Change in status  Provider response No new orders  Date of Provider Response 09/24/20   Notified by CCM that patient had a 3 beat run of [facility].  VS are stable.  Patient is A & O x 4 and no complaints of CP or SOB.  Dr. Zigmund Daniel made aware and no new orders received.  Will continue to monitor patient.  Earleen Reaper RN

## 2020-09-25 NOTE — Progress Notes (Signed)
   09/25/20 2230  Provider Notification  Provider Name/Title Dr. Rock Nephew  Date Provider Notified 09/24/20  Time Provider Notified 2230  Notification Type Page  Notification Reason Change in status  Provider response No new orders  Date of Provider Response 09/24/20  Time of Provider Response 2033   Notified by CCM that patient had 11 beat run of SVT.  Upon assessment, patient is A & O x4 and asymptomatic.  B/P is 97/58 and HR is 64.  Dr. Rock Nephew made aware.  No new orders at this time.  Will continue to monitor patient.  Earleen Reaper RN

## 2020-09-25 NOTE — Progress Notes (Signed)
Family Medicine Teaching Service Daily Progress Note Intern Pager: [phone]  Patient name: Keith [name] Medical record number: [id number] Date of birth: [birth year] Age: 80 y.o. Gender: male  Primary Care Provider: Rise Patience, DO Consultants: GI Code Status: DNR/DNI  Pt Overview and Major Events to Date:  9/7: Admitted 9/8: EGD, no bleeding source found 9/9: Colonoscopy performed, no bleeding source found  Assessment and Plan: Patient is a 80 year old male presenting with dysarthria, found to have symptomatic anemia with a hemoglobin of 5.5.  Past medical history significant for paroxysmal A. fib, colon cancer, SAH, CVA, HLD, CKD stage IV, GERD, IDA and urinary retention with indwelling Foley catheter.  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  Came to the ED for dysarthria which has since resolved. Report a recent Hx of melena.  Found to have a hemoglobin of 5.5, patient received 2 units packed red blood cells.  Eliquis reversed.  GI consulted, EGD and colonoscopy performed.  No source of bleeding found.  Hemoglobin peaked at 8.6, currently 7.9. Pt denies any more melanic stools.  - GI consult - Follow-up biopsy results - Transfuse hemoglobin to above 7, per GI recs - Recheck hemoglobin tomorrow a.m. - Switch IV Protonix twice daily to oral Protonix twice daily - Restart home Eliquis 2.5 mg twice daily, per GI recommendation (patient on lower dose given kidney function) - Continue SCDs for DVT prophylaxis - PT/OT  History of CVA, dysarthria (resolved) MRI brain with no concern for acute infarct.  Neuro exam nonfocal.  Symptoms likely due to anemia.  Now resolved with correction of hemoglobin. - Monitor for changes in mental status  MRI incidental findings Sclerosis of the calvarium concerning for malignancy or posttreatment changes (patient had a melanoma excised in this location).   Radiology recommended contrasted MRI study for follow-up, however, outpatient oncologist contacted and recommends no further work-up. - No further work-up needed  Paroxysmal AF Hm meds include eliquis, requiring reversal due to GI bleed.  EKG on admission normal sinus rhythm. Tele unremarkable thus far. - cardiac monitoring   Indwelling Foley catheter Foley placed for urinary retention during hospitalization for July 2022.  Urine output 0.65 mL/kg/h. - Continue to monitor urine output  Dysphagia Patient reporting increased difficulty with swallowing sandwich meat and rice over the last few weeks.  He has an outpatient SLP evaluation for swallow study close to his urology appointment.  Plan is currently for swallow study while inpatient, however, patient may be discharged sooner. - Soft diet, thin fluids  Malnutrition 30 pound weight loss recently in the context of cancer - RD consult -Nutritional supplements per RD  CKD 4 Baseline creatinine 1.98-2.5.  Creatinine of 1.96 this morning.  GERD - Protonix as above  FEN/GI: Soft diet, thin fluids PPx: On therapeutic Eliquis Dispo:f/u PT/OT recs  Subjective:  On interview this morning patient voices no complaints.  He is found eating breakfast.  He is fully alert and oriented.  He denies any pain and is appreciative of the care he is receiving.  Review of systems is negative for chest pain, shortness of breath, nausea/vomiting.  Objective: Temp:  [97.1 F (36.2 C)-97.7 F (36.5 C)] 97.5 F (36.4 C) (09/09 2040) Pulse Rate:  [57-69] 62 (09/09 2040) Resp:  [11-21] 17 (09/09 2040) BP: (81-165)/(20-63) 105/51 (09/09 2040) SpO2:  [97 %-100 %] 100 % (09/09 2040) Physical Exam Vitals reviewed.  Constitutional:      Appearance: He  is ill-appearing.  Cardiovascular:     Rate and Rhythm: Normal rate and regular rhythm.     Pulses: Normal pulses.     Heart sounds: No murmur heard. Pulmonary:     Effort: Pulmonary effort is normal.      Breath sounds: Normal breath sounds.  Abdominal:     General: Bowel sounds are normal.     Palpations: Abdomen is soft.     Tenderness: There is no abdominal tenderness.  Neurological:     Mental Status: He is alert.     Laboratory: Recent Labs  Lab 09/23/20 0426 09/23/20 1621 09/24/20 0435 09/25/20 0358  WBC 6.4  --  7.0 5.4  HGB 7.9* 8.6* 8.3* 7.9*  HCT 25.3* 27.3* 26.5* 25.5*  PLT 256  --  243 238   Recent Labs  Lab 09/22/20 0433 09/23/20 0426 09/24/20 0435 09/25/20 0358  NA 134* 137 141 138  K 4.9 4.6 4.2 3.9  CL 103 106 111 111  CO2 23 25 20* 18*  BUN 34* 28* 23 19  CREATININE 2.17* 2.15* 1.99* 1.96*  CALCIUM 9.1 9.0 9.3 8.5*  PROT 6.0* 5.5*  --   --   BILITOT 0.3 1.0  --   --   ALKPHOS 62 59  --   --   ALT 9 8  --   --   AST 15 13*  --   --   GLUCOSE 110* 91 93 101*    Imaging/Diagnostic Tests: Colonoscopy as above  Corky Sox PGY-1, Psychiatry FPTS Intern pager: [phone], text pages welcome

## 2020-09-26 DIAGNOSIS — D62 Acute posthemorrhagic anemia: Secondary | ICD-10-CM | POA: Diagnosis not present

## 2020-09-26 LAB — HEMOGLOBIN AND HEMATOCRIT, BLOOD
HCT: 25.7 % — ABNORMAL LOW (ref 39.0–52.0)
Hemoglobin: 7.9 g/dL — ABNORMAL LOW (ref 13.0–17.0)

## 2020-09-26 NOTE — Discharge Summary (Addendum)
[facility] Discharge Summary  Patient name: Keith [name] Medical record number: [id number] Date of birth: 07/19/1940 Age: 80 y.o. Gender: male Date of Admission: 09/22/2020  Date of Discharge: 09/26/2020 Admitting Physician: Lenoria Chime, MD  Primary Care Provider: Rise Patience, DO Consultants: GI  Indication for Hospitalization: dysarthria and symptomatic anemia   Discharge Diagnoses/Problem List:  Symptomatic anemia  Hx of colon cancer Hx of CVA Paroxysmal atrial fibrillation Malnutrition CKD stage 4 GERD   Disposition: Home  Discharge Condition: stable   Discharge Exam:  Temp:  [97.4 F (36.3 C)-98.4 F (36.9 C)] 97.6 F (36.4 C) (09/11 0631) Pulse Rate:  [57-66] 66 (09/11 0631) Resp:  [17-18] 17 (09/11 0631) BP: (93-115)/(44-58) 101/44 (09/11 0631) SpO2:  [98 %-100 %] 100 % (09/11 0631) Physical Exam: General: Patient resting comfortably in bed, in no acute distress. Cardiovascular: RRR, no murmurs or gallops auscultated Respiratory: CTAB, breathing comfortably on room air Abdomen: soft, nontender, nondistended, presence of bowel sounds Extremities: no LE edema noted bilaterally, radial and distal pulses strong and equal bilaterally  Neuro: AOx4, CN 2-12 grossly intact with exception of CN8 limited, gross sensation intact, 5/5 UE and LE strength bilaterally Psych: mood appropriate   Brief Hospital Course:  Patient is a 80 year old male presenting with dysarthria, found to have symptomatic anemia with a hemoglobin of 5.5.  Past medical history significant for paroxysmal A. fib, colon cancer, SAH, CVA, HLD, CKD stage IV, GERD, IDA and urinary retention with indwelling Foley catheter.  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 Came to the ED for dysarthria, which resolved on admission. Patient reported recent melena. Found to have a hgb of 5.5.  S/P 2U PRBCs, Hgb 7.9 on recheck. Eliquis for AF reversed. Started on IV Protonix. GI consulted, performed an EGD and colonoscopy, neither of which showed an obvious source of bleeding. Biopsies were obtained during both procedures, pending at time of discharge. GI recommended follow up as needed. His Hgb at discharge was 7.9 without any further episodes of bleeding.    Paroxysmal AF Home meds include eliquis, requiring reversal due to GI bleed .  EKG on admission normal sinus rhythm. Telemetry was unremarkable during the admission. After extensive discussion of risks vs benefits, patient decided to continue eliquis and was discharged home on his home anticoagulation.   Hx CVA  speech difficulty (resolved)  Symptoms resolved on admission, thought to be due to symptomatic anemia. Head CT with remote left insular cortex infarct without acute intracranial findings or mass lesions. MRI brain with no concern for acute infarct. Patient alert and oriented with 5/5 strength on exam of bilateral upper and lower extremities without focal findings. No further symptoms noted throughout the remainder of patient's hospitalization.    MRI incidental findings Sclerosis of the calvarium c/f malignancy or post treatment changes (pt had a melanoma excised at this location). Radiology recommending contrasted MRI study, however, outpatient oncologist was contacted and he did not think further workup was warranted given patient has routine surveillance follow up with oncology outpatient.    Indwelling Foley Catheter Foley placed  for urinary retention during hospitalization in July 2022 for SBO. Patient referred to follow with [facility]. During this admission, foley did not need to be re-placed. No evidence of hematuria, acceptable UOP throughout admit.   All other issues chronic and stable.    Issues for Follow Up:  Held midodrine on  discharge, consider restarting as appropriate. Held iron supplementation on  discharge, consider restarting if needed. Given patient's dysphagia, please ensure patient gets outpatient swallow study performed, scheduled for 9/14. Ensure that patient follow's up with urology outpatient as appropriate, has a foley catheter in place. Repeat CBC at hospital follow up, patient's transfusion threshold is 7, per GI. Patient restarted on eliquis per his choice, ensure no further bleeding episodes.  Patient should follow up with GI outpatient as needed, per GI recommendations.  Patient sent home with home health, please ensure he is receiving these services.  Significant Procedures:  9/8: EGD performed demonstrated esophageal stenosis and 4 cm hiatal hernia, with erosive gastropathy without bleeding.  9/9: Colonoscopy performed notable for one 4 mm sessile mid-sigmoid polyp resected and retrieved, minimal sigmoid diverticulosis, ileo-colonic anastomosis with non-bleeding internal hemorrhoids.   Significant Labs and Imaging:  Recent Labs  Lab 09/23/20 0426 09/23/20 1621 09/24/20 0435 09/25/20 0358 09/26/20 0455  WBC 6.4  --  7.0 5.4  --   HGB 7.9*   < > 8.3* 7.9* 7.9*  HCT 25.3*   < > 26.5* 25.5* 25.7*  PLT 256  --  243 238  --    < > = values in this interval not displayed.   Recent Labs  Lab 09/22/20 0433 09/22/20 2200 09/23/20 0426 09/24/20 0435 09/25/20 0358  NA 134*  --  137 141 138  K 4.9  --  4.6 4.2 3.9  CL 103  --  106 111 111  CO2 23  --  25 20* 18*  GLUCOSE 110*  --  91 93 101*  BUN 34*  --  28* 23 19  CREATININE 2.17*  --  2.15* 1.99* 1.96*  CALCIUM 9.1  --  9.0 9.3 8.5*  MG  --  2.5*  --   --   --   ALKPHOS 62  --  59  --   --   AST 15  --  13*  --   --   ALT 9  --  8  --   --   ALBUMIN 2.7*  --  2.5*  --   --       Results/Tests Pending at Time of Discharge:  Biopsies from both EGD and colonoscopy pending at time of discharge.   Discharge Medications:  Allergies as of 09/26/2020       Reactions   Penicillins Other (See Comments)    Has patient had a PCN reaction causing immediate rash, facial/tongue/throat swelling, SOB or lightheadedness with hypotension: Unknown Has patient had a PCN reaction causing severe rash involving mucus membranes or skin necrosis: Unknown Has patient had a PCN reaction that required hospitalization: Unknown Has patient had a PCN reaction occurring within the last 10 years: No If all of the above answers are "NO", then may proceed with Cephalosporin use. Tolerated cephlasporins        Medication List     STOP taking these medications    acetaminophen 500 MG tablet Commonly known as: TYLENOL   ferrous sulfate 325 (65 FE) MG tablet   midodrine 2.5 MG tablet Commonly known as: PROAMATINE       TAKE these medications    apixaban 2.5 MG Tabs tablet Commonly known as: ELIQUIS Take 1 tablet (2.5 mg total) by mouth 2 (two) times daily.   atorvastatin 40 MG tablet Commonly known as: LIPITOR TAKE 1 TABLET (40 MG TOTAL) BY MOUTH DAILY AT 6 PM.   cholecalciferol 25 MCG (1000 UNIT) tablet  Commonly known as: VITAMIN D3 Take 1,000 Units by mouth daily.   Dapsone 5 % topical gel Apply 1 application topically 2 (two) times a week.   Ensure Plus Liqd Take 237 mLs by mouth 2 (two) times daily between meals. Choc or Vanilla   famotidine 20 MG tablet Commonly known as: PEPCID Take 1 tablet (20 mg total) by mouth 2 (two) times daily. What changed: when to take this   multivitamin capsule Take 1 capsule by mouth daily.   timolol 0.5 % ophthalmic solution Commonly known as: TIMOPTIC Place 1 drop into both eyes 2 (two) times daily.        Discharge Instructions: Please refer to Patient Instructions section of EMR for full details.  Patient was counseled important signs and symptoms that should prompt return to medical care, changes in medications, dietary instructions, activity restrictions, and follow up appointments.   Follow-Up Appointments:  Follow-up [facility]. Go on 10/05/2020.   Specialty: Family Medicine Why: Appointment at 11 am, please arrive at least 15 minutes earlier than your scheduled appointment. Contact information: [address] [id number] [place], [facility] Follow up.   Specialty: Home Health Services Why: the office will call to schedule home health visits. please allow 48 hours from discharge for follow up call. thanks. Contact information: [place] [address] [phone]                 Donney Dice, DO 09/26/2020, 2:20 PM PGY-2, [place]

## 2020-09-26 NOTE — Progress Notes (Signed)
Discharge instructions (including medications) discussed with using teach-back method and a copy of his after-visit summary was provided to directly to the patient.  Patient given the opportunity to ask questions.  He had no further questions or concerns.  He will reach out to his urologist with follow-up appointment for urinary catheter and is planning to reschedule his barium swallow test if there is a conflict with his urologist.  Discussed number for rescheduling test at [facility], if needed.  IVs removed with no complications.  All belongings collected in patient belongings bag- including tablet, phone and tablet chargers, personal care items, clothes and cell phone.  Foley catheter bag changed to a leg bag and replacement drainage bag for nighttime given to patient.  Patient shown how to change bag and voiced having his niece at home to assist (already helps with this at home).  He was taken down to patient discharge via wheelchair and left in private vehicle driven by his son.

## 2020-09-26 NOTE — Discharge Instructions (Addendum)
You were hospitalized at [facility] due to weakness.  We expect this is from anemia given your low hemoglobin level which improved after interventions from gastroenterology including an endoscopy and colonoscopy.  We are so glad you are feeling better.  Be sure to follow-up with your regularly scheduled appointments.  Please also be sure to follow-up with our clinic/PCP on 9/20.  Please also go to your scheduled speech swallow study on 9/14 at 1pm at [facility]. Thank you for allowing [place] to be a part of your medical care.  Take care, [facility] family medicine team    Information on my medicine - ELIQUIS (apixaban)  Why was Eliquis prescribed for you? Eliquis was prescribed for you to reduce the risk of a blood clot forming that can cause a stroke if you have a medical condition called atrial fibrillation (a type of irregular heartbeat).  What do You need to know about Eliquis ? Take your Eliquis TWICE DAILY - one tablet in the morning and one tablet in the evening with or without food. If you have difficulty swallowing the tablet whole please discuss with your pharmacist how to take the medication safely.  Take Eliquis exactly as prescribed by your doctor and DO NOT stop taking Eliquis without talking to the doctor who prescribed the medication.  Stopping may increase your risk of developing a stroke.  Refill your prescription before you run out.  After discharge, you should have regular check-up appointments with your healthcare provider that is prescribing your Eliquis.  In the future your dose may need to be changed if your kidney function or weight changes by a significant amount or as you get older.  What do you do if you miss a dose? If you miss a dose, take it as soon as you remember on the same day and resume taking twice daily.  Do not take more than one dose of ELIQUIS at the same time to make up a missed dose.  Important Safety Information A possible side effect of  Eliquis is bleeding. You should call your healthcare provider right away if you experience any of the following: Bleeding from an injury or your nose that does not stop. Unusual colored urine (red or dark brown) or unusual colored stools (red or black). Unusual bruising for unknown reasons. A serious fall or if you hit your head (even if there is no bleeding).  Some medicines may interact with Eliquis and might increase your risk of bleeding or clotting while on Eliquis. To help avoid this, consult your healthcare provider or pharmacist prior to using any new prescription or non-prescription medications, including herbals, vitamins, non-steroidal anti-inflammatory drugs (NSAIDs) and supplements.  This website has more information on Eliquis (apixaban): http://www.eliquis.com/eliquis/home

## 2020-09-26 NOTE — Progress Notes (Signed)
Family Medicine Teaching Service Daily Progress Note Intern Pager: [phone]  Patient name: Keith [name] Medical record number: [id number] Date of birth: 01-23-1940 Age: 80 y.o. Gender: male  Primary Care Provider: Rise Patience, DO Consultants: GI Code Status: DNR/DNI  Pt Overview and Major Events to Date:  9/7: Admitted 9/8: EGD performed 9/9: Colonoscopy performed   Assessment and Plan: Patient is a 80 year old male presenting with dysarthria, found to have symptomatic anemia with a hemoglobin of 5.5.  Past medical history significant for paroxysmal A. fib, colon cancer, SAH, CVA, HLD, CKD stage IV, GERD, IDA and urinary retention with indwelling Foley catheter.   Symptomatic anemia, history of colon cancer Hgb 7.9, stable from yesterday. Denies any source of bleeding. Transfusion threshold hold remains 7, per GI.  -GI consulted, appreciate continued involvement  -Follow-up biopsy results -am CBC -continue protonix 40 mg bid  -home eliquis  -SCDs -PT/OT  History of CVA, dysarthria  Resolved. MRI brain with no concern for acute infarct.  Neurology exam remains unremarkable. Symptoms likely due to anemia.  Now resolved with correction of hemoglobin. -continue to monitor clinically    Incidental findings MRI noted to sclerosis of the calvarium concerning for malignancy or posttreatment changes (patient had a melanoma excised in this location).  Radiology recommended contrasted MRI study for follow-up, however, outpatient oncologist contacted and recommends no further work-up.   Paroxysmal AF Hm meds include eliquis, requiring reversal due to GI bleed.  EKG on admission normal sinus rhythm.  -restarted home eliquis yesterday -cardiac monitoring    Indwelling Foley catheter Foley placed for urinary retention during hospitalization for July 2022.  Urine output 1 mL/kg/h, improved since yestday. -monitor urine output   Dysphagia Plan for SLP evaluation with swallow study  0/10 due to conflicting outpatient swallow study with scheduled clinic visit. Previously spoke with SLP, study scheduled while inpatient.  -continue soft diet with thin fluids  -plan for swallow study 9/12   Malnutrition 30 pound weight loss recently in the context of cancer -RD consulted, appreciate recs  -nutritional supplements per RD   CKD 4 Improving, most recent Cr 1.96. Baseline creatinine 1.98-2.5.   -monitor as appropriate    GERD -continue protonix    FEN/GI: soft diet with thin fluids PPx: eliquis  Dispo:Pending PT recommendations  tomorrow. Barriers include SLP therapy.   Subjective:  No acute overnight events reported. Patient denies any concerns. States that he prefers to get the swallow study done tomorrow inpatient to avoid future scheduling conflicts with his outpatient urology appointment. Denies any bleeding source. Denies dyspnea or any pain.   Objective: Temp:  [97.4 F (36.3 C)-98.4 F (36.9 C)] 97.6 F (36.4 C) (09/11 0631) Pulse Rate:  [57-66] 66 (09/11 0631) Resp:  [17-18] 17 (09/11 0631) BP: (93-115)/(44-58) 101/44 (09/11 0631) SpO2:  [98 %-100 %] 100 % (09/11 0631) Physical Exam: General: Patient resting comfortably in bed, in no acute distress. Cardiovascular: RRR, no murmurs or gallops auscultated Respiratory: CTAB, breathing comfortably on room air Abdomen: soft, nontender, nondistended, presence of bowel sounds Extremities: no LE edema noted bilaterally, radial and distal pulses strong and equal bilaterally  Neuro: AOx4, CN 2-12 grossly intact with exception of CN8 limited, gross sensation intact, 5/5 UE and LE strength bilaterally Psych: mood appropriate   Laboratory: Recent Labs  Lab 09/23/20 0426 09/23/20 1621 09/24/20 0435 09/25/20 0358 09/26/20 0455  WBC 6.4  --  7.0 5.4  --   HGB 7.9*   < > 8.3* 7.9* 7.9*  HCT 25.3*   < >  26.5* 25.5* 25.7*  PLT 256  --  243 238  --    < > = values in this interval not displayed.   Recent  Labs  Lab 09/22/20 0433 09/23/20 0426 09/24/20 0435 09/25/20 0358  NA 134* 137 141 138  K 4.9 4.6 4.2 3.9  CL 103 106 111 111  CO2 23 25 20* 18*  BUN 34* 28* 23 19  CREATININE 2.17* 2.15* 1.99* 1.96*  CALCIUM 9.1 9.0 9.3 8.5*  PROT 6.0* 5.5*  --   --   BILITOT 0.3 1.0  --   --   ALKPHOS 62 59  --   --   ALT 9 8  --   --   AST 15 13*  --   --   GLUCOSE 110* 91 93 101*      Imaging/Diagnostic Tests: No results found.   Donney Dice, DO 09/26/2020, 7:53 AM PGY-2, [place] Intern pager: [phone], text pages welcome

## 2020-09-26 NOTE — Evaluation (Signed)
Occupational Therapy Evaluation Patient Details Name: Keith [name] MRN: [id number] DOB: 1940-05-23 Today's Date: 09/26/2020    History of Present Illness 80 y.o. male presents to [facility] ED on 09/22/2020 with speech difficulty, found to have symptomatic anemia with Hgb of 5.5. Pt underwent upper GI endoscopy on 9/8 and colonoscopy on 9/9. PMH includes  pAF, colon cancer, SAH, CVA, HLD, CKD stage IV, GERD, IDA and urinary retention with indwelling Foley catheter.   Clinical Impression   Keith [name] was evaluated s/p the above hospital admission list. PTA he was relatively mod I for ADLs and mobility PTA, his granddaughter provided close supervision for all function tasks. Upon evaluation pt was set up- supervision for all sitting ADLs, and min A for lower body ADLs. He completes functional mobility with close supervision/min guard, and is limited by fatigue and poor activity tolerance. Pt would benefit from continued OT acutely should his length of stay continue. Pt has active plans to d/c home this afternoon with assist of his granddaughter.     Follow Up Recommendations  Home health OT;Supervision/Assistance - 24 hour    Equipment Recommendations  None recommended by OT       Precautions / Restrictions Precautions Precautions: Fall Restrictions Weight Bearing Restrictions: No      Mobility Bed Mobility Overal bed mobility: Modified Independent                  Transfers Overall transfer level: Needs assistance Equipment used: None Transfers: Sit to/from Stand Sit to Stand: Supervision              Balance Overall balance assessment: Needs assistance Sitting-balance support: No upper extremity supported;Feet supported Sitting balance-[name] Scale: Good     Standing balance support: No upper extremity supported;During functional activity Standing balance-[name] Scale: Fair                             ADL either performed or assessed with clinical judgement    ADL Overall ADL's : Needs assistance/impaired Eating/Feeding: Supervision/ safety Eating/Feeding Details (indicate cue type and reason): pt continues to have difficulty swallowing Grooming: Set up;Sitting   Upper Body Bathing: Set up;Sitting   Lower Body Bathing: Sit to/from stand;Minimal assistance   Upper Body Dressing : Set up;Sitting   Lower Body Dressing: Sit to/from stand;Minimal assistance   Toilet Transfer: Min guard;Stand-pivot;BSC   Toileting- Clothing Manipulation and Hygiene: Min guard;Sitting/lateral lean       Functional mobility during ADLs: Min guard General ADL Comments: mod A for lower body dressing to reach bilat feet and for balance in standing during dynamic task     Vision Baseline Vision/History: 1 Wears glasses Ability to See in Adequate Light: 0 Adequate Patient Visual Report: No change from baseline Vision Assessment?: No apparent visual deficits     Perception     Praxis      Pertinent Vitals/Pain Pain Assessment: Faces Faces Pain Scale: Hurts a [name] bit Pain Location: generalized wtih movement Pain Descriptors / Indicators: Discomfort;Grimacing Pain Intervention(s): Limited activity within patient's tolerance;Monitored during session     Hand Dominance Right   Extremity/Trunk Assessment Upper Extremity Assessment Upper Extremity Assessment: Generalized weakness   Lower Extremity Assessment Lower Extremity Assessment: Defer to PT evaluation   Cervical / Trunk Assessment Cervical / Trunk Assessment: Normal   Communication Communication Communication: No difficulties   Cognition Arousal/Alertness: Awake/alert Behavior During Therapy: WFL for tasks assessed/performed Overall Cognitive Status: Within Functional Limits for tasks  assessed                                     General Comments  VSS on RA, pt requesting leg bag prior to dc    Exercises     Shoulder Instructions      Home Living Family/patient  expects to be discharged to:: Private residence Living Arrangements: Other relatives Available Help at Discharge: Family;Available 24 hours/day Type of Home: House Home Access: Stairs to enter [employer] of Steps: 1+1 Entrance Stairs-Rails: None Home Layout: One level     Bathroom Shower/Tub: Occupational psychologist: Handicapped height Bathroom Accessibility: Yes   Home Equipment: Shower seat;Bedside commode;Walker - 2 wheels   Additional Comments: pt states his granddaughter will be with him 24/7      Prior Functioning/Environment Level of Independence: Needs assistance  Gait / Transfers Assistance Needed: granddaughter provides minG for mobility, pt has been working with HHPT recently since surgery ADL's / Homemaking Assistance Needed: min guard to min assist due to recent hospitlization and weakness            OT Problem List: Decreased strength;Decreased range of motion;Decreased activity tolerance;Impaired balance (sitting and/or standing);Decreased safety awareness;Pain      OT Treatment/Interventions: Self-care/ADL training;Therapeutic exercise;DME and/or AE instruction;Therapeutic activities;Balance training;Patient/family education    OT Goals(Current goals can be found in the care plan section) Acute Rehab OT Goals Patient Stated Goal: to go home OT Goal Formulation: With patient Time For Goal Achievement: 10/10/20 Potential to Achieve Goals: Fair  OT Frequency: Min 2X/week   Barriers to D/C:            Co-evaluation              AM-PAC OT "6 Clicks" Daily Activity     Outcome Measure Help from another person eating meals?: [name] Help from another person taking care of personal grooming?: [name] Help from another person toileting, which includes using toliet, bedpan, or urinal?: [name] Help from another person bathing (including washing, rinsing, drying)?: [name] Help from another person to put on and taking off  regular upper body clothing?: None Help from another person to put on and taking off regular lower body clothing?: [name] 6 Click Score: 19   End of Session Nurse Communication: Mobility status;Precautions;Weight bearing status  Activity Tolerance: Patient tolerated treatment well Patient left: in bed;with call bell/phone within reach  OT Visit Diagnosis: Unsteadiness on feet (R26.81);Muscle weakness (generalized) (M62.81);Pain                Time: 1340-1353 OT Time Calculation (min): 13 min Charges:  OT General Charges $OT Visit: 1 Visit OT Evaluation $OT Eval Moderate Complexity: 1 Mod   Kassius Battiste A Wilhelmena Zea 09/26/2020, 2:06 PM

## 2020-09-26 NOTE — Progress Notes (Signed)
FPTS Brief Progress Note  S: Patient seen at bedside for evening rounds. He was resting comfortably and denied complaints. No palpitations or chest pain. Mentating appropriately. Appreciative of the visit.  Notified by RN that patient had 11 beat run of asymptomatic SVT. No other concerns from RN at this time.   O: BP (!) 97/58 (BP Location: Right Arm)   Pulse 64   Temp 98.4 F (36.9 C) (Oral)   Resp 17   Ht 5\' 9"  (1.753 m)   Wt 44.5 kg   SpO2 98%   BMI 14.49 kg/m   Gen: alert, frail elderly male resting comfortably, no distress CV: RRR, normal S1/S2 Resp: normal work of breathing Abd: soft, nontender  A/P: Stable. -No changes to current care -Plan per day team -Orders reviewed. Labs for AM ordered.   -Await results of AM Hemoglobin   Alcus Dad, MD 09/26/2020, 12:27 AM PGY-2, [facility] Night Resident  Please page [phone] with questions.

## 2020-09-26 NOTE — TOC Transition Note (Signed)
Transition of Care [facility]) - CM/SW Discharge Note   Patient Details  Name: Keith [name] MRN: [id number] Date of Birth: Nov 26, 1940  [facility] CM/SW Contact:  Bartholomew Crews, RN Phone Number: [phone] 09/26/2020, 2:32 PM   Clinical Narrative:     Patient to transition home today. Verified active disciplines with [facility]. HH orders received from provider for resumption of services.   Final next level of care: [place] Barriers to Discharge: No Barriers Identified   Patient Goals and CMS Choice Patient states their goals for this hospitalization and ongoing recovery are:: To go home CMS Medicare.gov Compare Post Acute Care list provided to:: Patient    Discharge Placement                       Discharge Plan and Services   Discharge Planning Services: CM Consult            DME Arranged: N/A DME Agency: NA       HH Arranged: RN, PT, OT, Speech Therapy HH Agency: [place] Date [facility] Agency Contacted: 09/26/20 Time [place]: [id number] Representative spoke with at [place]: [place] ([place]) Interventions     Readmission Risk Interventions Readmission Risk Prevention Plan 08/18/2020  Transportation Screening Complete  PCP or Specialist Appt within 3-5 Days Complete  HRI or [place] Complete  Social Work Consult for [place] Planning/Counseling Complete  Palliative Care Screening Not Applicable  Medication Review Press photographer) Complete  Some recent data might be hidden

## 2020-09-27 LAB — SURGICAL PATHOLOGY

## 2020-09-28 ENCOUNTER — Other Ambulatory Visit: Payer: Self-pay | Admitting: Family Medicine

## 2020-09-28 DIAGNOSIS — Z7901 Long term (current) use of anticoagulants: Secondary | ICD-10-CM | POA: Diagnosis not present

## 2020-09-28 DIAGNOSIS — E43 Unspecified severe protein-calorie malnutrition: Secondary | ICD-10-CM | POA: Diagnosis not present

## 2020-09-28 DIAGNOSIS — D630 Anemia in neoplastic disease: Secondary | ICD-10-CM | POA: Diagnosis not present

## 2020-09-28 DIAGNOSIS — I4891 Unspecified atrial fibrillation: Secondary | ICD-10-CM | POA: Diagnosis not present

## 2020-09-28 DIAGNOSIS — Z483 Aftercare following surgery for neoplasm: Secondary | ICD-10-CM | POA: Diagnosis not present

## 2020-09-28 DIAGNOSIS — C19 Malignant neoplasm of rectosigmoid junction: Secondary | ICD-10-CM | POA: Diagnosis not present

## 2020-09-28 DIAGNOSIS — D631 Anemia in chronic kidney disease: Secondary | ICD-10-CM | POA: Diagnosis not present

## 2020-09-28 DIAGNOSIS — H9190 Unspecified hearing loss, unspecified ear: Secondary | ICD-10-CM | POA: Diagnosis not present

## 2020-09-28 DIAGNOSIS — Z8673 Personal history of transient ischemic attack (TIA), and cerebral infarction without residual deficits: Secondary | ICD-10-CM | POA: Diagnosis not present

## 2020-09-28 DIAGNOSIS — R1310 Dysphagia, unspecified: Secondary | ICD-10-CM

## 2020-09-28 DIAGNOSIS — I2510 Atherosclerotic heart disease of native coronary artery without angina pectoris: Secondary | ICD-10-CM | POA: Diagnosis not present

## 2020-09-28 DIAGNOSIS — Z86010 Personal history of colonic polyps: Secondary | ICD-10-CM | POA: Diagnosis not present

## 2020-09-28 DIAGNOSIS — Z9181 History of falling: Secondary | ICD-10-CM | POA: Diagnosis not present

## 2020-09-28 DIAGNOSIS — D509 Iron deficiency anemia, unspecified: Secondary | ICD-10-CM | POA: Diagnosis not present

## 2020-09-28 DIAGNOSIS — N184 Chronic kidney disease, stage 4 (severe): Secondary | ICD-10-CM | POA: Diagnosis not present

## 2020-09-28 DIAGNOSIS — C434 Malignant melanoma of scalp and neck: Secondary | ICD-10-CM | POA: Diagnosis not present

## 2020-09-28 DIAGNOSIS — Z87891 Personal history of nicotine dependence: Secondary | ICD-10-CM | POA: Diagnosis not present

## 2020-09-28 DIAGNOSIS — Z466 Encounter for fitting and adjustment of urinary device: Secondary | ICD-10-CM | POA: Diagnosis not present

## 2020-09-29 ENCOUNTER — Ambulatory Visit (HOSPITAL_COMMUNITY): Payer: Medicare Other

## 2020-09-29 ENCOUNTER — Telehealth: Payer: Self-pay

## 2020-09-29 DIAGNOSIS — Z483 Aftercare following surgery for neoplasm: Secondary | ICD-10-CM | POA: Diagnosis not present

## 2020-09-29 DIAGNOSIS — E43 Unspecified severe protein-calorie malnutrition: Secondary | ICD-10-CM | POA: Diagnosis not present

## 2020-09-29 DIAGNOSIS — Z9181 History of falling: Secondary | ICD-10-CM | POA: Diagnosis not present

## 2020-09-29 DIAGNOSIS — D509 Iron deficiency anemia, unspecified: Secondary | ICD-10-CM | POA: Diagnosis not present

## 2020-09-29 DIAGNOSIS — C434 Malignant melanoma of scalp and neck: Secondary | ICD-10-CM | POA: Diagnosis not present

## 2020-09-29 DIAGNOSIS — Z8673 Personal history of transient ischemic attack (TIA), and cerebral infarction without residual deficits: Secondary | ICD-10-CM | POA: Diagnosis not present

## 2020-09-29 DIAGNOSIS — Z86010 Personal history of colonic polyps: Secondary | ICD-10-CM | POA: Diagnosis not present

## 2020-09-29 DIAGNOSIS — H9190 Unspecified hearing loss, unspecified ear: Secondary | ICD-10-CM | POA: Diagnosis not present

## 2020-09-29 DIAGNOSIS — D631 Anemia in chronic kidney disease: Secondary | ICD-10-CM | POA: Diagnosis not present

## 2020-09-29 DIAGNOSIS — C19 Malignant neoplasm of rectosigmoid junction: Secondary | ICD-10-CM | POA: Diagnosis not present

## 2020-09-29 DIAGNOSIS — R338 Other retention of urine: Secondary | ICD-10-CM | POA: Diagnosis not present

## 2020-09-29 DIAGNOSIS — Z466 Encounter for fitting and adjustment of urinary device: Secondary | ICD-10-CM | POA: Diagnosis not present

## 2020-09-29 DIAGNOSIS — D630 Anemia in neoplastic disease: Secondary | ICD-10-CM | POA: Diagnosis not present

## 2020-09-29 DIAGNOSIS — I2510 Atherosclerotic heart disease of native coronary artery without angina pectoris: Secondary | ICD-10-CM | POA: Diagnosis not present

## 2020-09-29 DIAGNOSIS — I4891 Unspecified atrial fibrillation: Secondary | ICD-10-CM | POA: Diagnosis not present

## 2020-09-29 DIAGNOSIS — N184 Chronic kidney disease, stage 4 (severe): Secondary | ICD-10-CM | POA: Diagnosis not present

## 2020-09-29 DIAGNOSIS — Z87891 Personal history of nicotine dependence: Secondary | ICD-10-CM | POA: Diagnosis not present

## 2020-09-29 DIAGNOSIS — Z7901 Long term (current) use of anticoagulants: Secondary | ICD-10-CM | POA: Diagnosis not present

## 2020-09-29 NOTE — Telephone Encounter (Signed)
Received phone call from Clair Gulling, Paia with [facility] regarding patient. Patient continues to have low range BP. Sitting BP: 90/50, standing 60/40. Patient is asymptomatic. Precepted with Dr. Andria Frames regarding low range BP. No new orders received. Will forward as FYI to PCP.   Patient does report pain 3/10 in proximal right thigh. Patient was told that he cannot take tylenol or ibuprofen for pain. Please advise pain management for patient.   Also received request to continue PT two times per week for two weeks. Verbal orders given per protocol.   Talbot Grumbling, RN

## 2020-09-30 DIAGNOSIS — I2510 Atherosclerotic heart disease of native coronary artery without angina pectoris: Secondary | ICD-10-CM | POA: Diagnosis not present

## 2020-09-30 DIAGNOSIS — Z8673 Personal history of transient ischemic attack (TIA), and cerebral infarction without residual deficits: Secondary | ICD-10-CM | POA: Diagnosis not present

## 2020-09-30 DIAGNOSIS — I4891 Unspecified atrial fibrillation: Secondary | ICD-10-CM | POA: Diagnosis not present

## 2020-09-30 DIAGNOSIS — H9190 Unspecified hearing loss, unspecified ear: Secondary | ICD-10-CM | POA: Diagnosis not present

## 2020-09-30 DIAGNOSIS — E43 Unspecified severe protein-calorie malnutrition: Secondary | ICD-10-CM | POA: Diagnosis not present

## 2020-09-30 DIAGNOSIS — Z87891 Personal history of nicotine dependence: Secondary | ICD-10-CM | POA: Diagnosis not present

## 2020-09-30 DIAGNOSIS — Z483 Aftercare following surgery for neoplasm: Secondary | ICD-10-CM | POA: Diagnosis not present

## 2020-09-30 DIAGNOSIS — Z9181 History of falling: Secondary | ICD-10-CM | POA: Diagnosis not present

## 2020-09-30 DIAGNOSIS — Z86010 Personal history of colonic polyps: Secondary | ICD-10-CM | POA: Diagnosis not present

## 2020-09-30 DIAGNOSIS — C434 Malignant melanoma of scalp and neck: Secondary | ICD-10-CM | POA: Diagnosis not present

## 2020-09-30 DIAGNOSIS — Z466 Encounter for fitting and adjustment of urinary device: Secondary | ICD-10-CM | POA: Diagnosis not present

## 2020-09-30 DIAGNOSIS — D630 Anemia in neoplastic disease: Secondary | ICD-10-CM | POA: Diagnosis not present

## 2020-09-30 DIAGNOSIS — Z7901 Long term (current) use of anticoagulants: Secondary | ICD-10-CM | POA: Diagnosis not present

## 2020-09-30 DIAGNOSIS — C19 Malignant neoplasm of rectosigmoid junction: Secondary | ICD-10-CM | POA: Diagnosis not present

## 2020-09-30 DIAGNOSIS — D509 Iron deficiency anemia, unspecified: Secondary | ICD-10-CM | POA: Diagnosis not present

## 2020-09-30 DIAGNOSIS — N184 Chronic kidney disease, stage 4 (severe): Secondary | ICD-10-CM | POA: Diagnosis not present

## 2020-09-30 DIAGNOSIS — D631 Anemia in chronic kidney disease: Secondary | ICD-10-CM | POA: Diagnosis not present

## 2020-10-04 ENCOUNTER — Other Ambulatory Visit: Payer: Self-pay | Admitting: Family Medicine

## 2020-10-04 ENCOUNTER — Telehealth: Payer: Self-pay | Admitting: Family Medicine

## 2020-10-04 DIAGNOSIS — C434 Malignant melanoma of scalp and neck: Secondary | ICD-10-CM | POA: Diagnosis not present

## 2020-10-04 DIAGNOSIS — E785 Hyperlipidemia, unspecified: Secondary | ICD-10-CM

## 2020-10-04 DIAGNOSIS — Z466 Encounter for fitting and adjustment of urinary device: Secondary | ICD-10-CM | POA: Diagnosis not present

## 2020-10-04 DIAGNOSIS — H9190 Unspecified hearing loss, unspecified ear: Secondary | ICD-10-CM | POA: Diagnosis not present

## 2020-10-04 DIAGNOSIS — D509 Iron deficiency anemia, unspecified: Secondary | ICD-10-CM | POA: Diagnosis not present

## 2020-10-04 DIAGNOSIS — N184 Chronic kidney disease, stage 4 (severe): Secondary | ICD-10-CM | POA: Diagnosis not present

## 2020-10-04 DIAGNOSIS — D630 Anemia in neoplastic disease: Secondary | ICD-10-CM | POA: Diagnosis not present

## 2020-10-04 DIAGNOSIS — I2510 Atherosclerotic heart disease of native coronary artery without angina pectoris: Secondary | ICD-10-CM | POA: Diagnosis not present

## 2020-10-04 DIAGNOSIS — K219 Gastro-esophageal reflux disease without esophagitis: Secondary | ICD-10-CM

## 2020-10-04 DIAGNOSIS — I4891 Unspecified atrial fibrillation: Secondary | ICD-10-CM | POA: Diagnosis not present

## 2020-10-04 DIAGNOSIS — Z9181 History of falling: Secondary | ICD-10-CM | POA: Diagnosis not present

## 2020-10-04 DIAGNOSIS — D631 Anemia in chronic kidney disease: Secondary | ICD-10-CM | POA: Diagnosis not present

## 2020-10-04 DIAGNOSIS — K449 Diaphragmatic hernia without obstruction or gangrene: Secondary | ICD-10-CM

## 2020-10-04 DIAGNOSIS — Z7901 Long term (current) use of anticoagulants: Secondary | ICD-10-CM | POA: Diagnosis not present

## 2020-10-04 DIAGNOSIS — C19 Malignant neoplasm of rectosigmoid junction: Secondary | ICD-10-CM | POA: Diagnosis not present

## 2020-10-04 DIAGNOSIS — Z87891 Personal history of nicotine dependence: Secondary | ICD-10-CM | POA: Diagnosis not present

## 2020-10-04 DIAGNOSIS — Z86010 Personal history of colonic polyps: Secondary | ICD-10-CM | POA: Diagnosis not present

## 2020-10-04 DIAGNOSIS — Z483 Aftercare following surgery for neoplasm: Secondary | ICD-10-CM | POA: Diagnosis not present

## 2020-10-04 DIAGNOSIS — Z8673 Personal history of transient ischemic attack (TIA), and cerebral infarction without residual deficits: Secondary | ICD-10-CM | POA: Diagnosis not present

## 2020-10-04 DIAGNOSIS — E43 Unspecified severe protein-calorie malnutrition: Secondary | ICD-10-CM | POA: Diagnosis not present

## 2020-10-04 NOTE — Telephone Encounter (Signed)
[place] aide calling to report that patient has been loosing weight steadily for the past few weeks and at this rate is going to be hitting the 90's. Patient has an appointment tomorrow, 9/20, with PCP and would like to get this issue checked out. Aide would like for PCP to call her after this appt with an update of what to do going forward. Her phone number is [phone].

## 2020-10-05 ENCOUNTER — Encounter: Payer: Self-pay | Admitting: Family Medicine

## 2020-10-05 ENCOUNTER — Other Ambulatory Visit: Payer: Self-pay

## 2020-10-05 ENCOUNTER — Ambulatory Visit (INDEPENDENT_AMBULATORY_CARE_PROVIDER_SITE_OTHER): Payer: Medicare Other | Admitting: Family Medicine

## 2020-10-05 VITALS — BP 87/41 | HR 77 | Ht 69.0 in | Wt 100.5 lb

## 2020-10-05 DIAGNOSIS — D649 Anemia, unspecified: Secondary | ICD-10-CM | POA: Diagnosis not present

## 2020-10-05 DIAGNOSIS — I4891 Unspecified atrial fibrillation: Secondary | ICD-10-CM | POA: Diagnosis not present

## 2020-10-05 DIAGNOSIS — N184 Chronic kidney disease, stage 4 (severe): Secondary | ICD-10-CM

## 2020-10-05 DIAGNOSIS — R636 Underweight: Secondary | ICD-10-CM | POA: Diagnosis not present

## 2020-10-05 DIAGNOSIS — Z23 Encounter for immunization: Secondary | ICD-10-CM

## 2020-10-05 DIAGNOSIS — E43 Unspecified severe protein-calorie malnutrition: Secondary | ICD-10-CM | POA: Diagnosis not present

## 2020-10-05 DIAGNOSIS — C434 Malignant melanoma of scalp and neck: Secondary | ICD-10-CM | POA: Diagnosis not present

## 2020-10-05 DIAGNOSIS — R479 Unspecified speech disturbances: Secondary | ICD-10-CM

## 2020-10-05 DIAGNOSIS — D631 Anemia in chronic kidney disease: Secondary | ICD-10-CM | POA: Diagnosis not present

## 2020-10-05 DIAGNOSIS — D509 Iron deficiency anemia, unspecified: Secondary | ICD-10-CM | POA: Diagnosis not present

## 2020-10-05 DIAGNOSIS — Z8673 Personal history of transient ischemic attack (TIA), and cerebral infarction without residual deficits: Secondary | ICD-10-CM | POA: Diagnosis not present

## 2020-10-05 DIAGNOSIS — C19 Malignant neoplasm of rectosigmoid junction: Secondary | ICD-10-CM | POA: Diagnosis not present

## 2020-10-05 DIAGNOSIS — D630 Anemia in neoplastic disease: Secondary | ICD-10-CM | POA: Diagnosis not present

## 2020-10-05 DIAGNOSIS — Z9181 History of falling: Secondary | ICD-10-CM | POA: Diagnosis not present

## 2020-10-05 DIAGNOSIS — I480 Paroxysmal atrial fibrillation: Secondary | ICD-10-CM

## 2020-10-05 DIAGNOSIS — H9190 Unspecified hearing loss, unspecified ear: Secondary | ICD-10-CM | POA: Diagnosis not present

## 2020-10-05 DIAGNOSIS — Z483 Aftercare following surgery for neoplasm: Secondary | ICD-10-CM | POA: Diagnosis not present

## 2020-10-05 DIAGNOSIS — I959 Hypotension, unspecified: Secondary | ICD-10-CM

## 2020-10-05 DIAGNOSIS — Z466 Encounter for fitting and adjustment of urinary device: Secondary | ICD-10-CM | POA: Diagnosis not present

## 2020-10-05 DIAGNOSIS — Z87891 Personal history of nicotine dependence: Secondary | ICD-10-CM | POA: Diagnosis not present

## 2020-10-05 DIAGNOSIS — Z86010 Personal history of colonic polyps: Secondary | ICD-10-CM | POA: Diagnosis not present

## 2020-10-05 DIAGNOSIS — Z7901 Long term (current) use of anticoagulants: Secondary | ICD-10-CM | POA: Diagnosis not present

## 2020-10-05 DIAGNOSIS — I2510 Atherosclerotic heart disease of native coronary artery without angina pectoris: Secondary | ICD-10-CM | POA: Diagnosis not present

## 2020-10-05 NOTE — Assessment & Plan Note (Addendum)
Chronic history of hypertension previously treated with midodrine.  Midodrine was stopped at discharge on September 11.  Blood pressures remained above 80/40.  He has remained completely asymptomatic.  We extensively discussed the risks and benefits of treating his hypertension and patient prefers not to go on midodrine at this time.  We think this is reasonable and we will inform his home health nurses.

## 2020-10-05 NOTE — Progress Notes (Signed)
I was personally present and performed or re-performed the history, physical exam and medical decision making activities of this service and have verified that the service and findings are accurately documented in the student's note.  Concha Sudol, DO                  10/05/2020, 12:40 PM

## 2020-10-05 NOTE — Assessment & Plan Note (Addendum)
Extensively discussed risk and benefit of Eliquis treatment given his recent severe anemic episode.  He remains asymptomatic at this time.  Patient is comfortable with continued Eliquis therapy and we think this is reasonable. -Continue Eliquis

## 2020-10-05 NOTE — Progress Notes (Signed)
SUBJECTIVE:   CHIEF COMPLAINT: Fatigue HPI:  Patient presents with his adult son Annie Main.  HPI is taken from both son and patient.  Hypotension Blood pressure in the office today is 87/41.  Patient and son report that patient's blood pressure ranges from 80/40-100/60 at home, however he has never been symptomatic with these blood pressures.  He has not become lightheaded, dizzy, unsteady nor has he fallen.  He was previously on midodrine for low blood pressure, however this was not for symptom control either.  He was just charged home from the hospital on September 11 and they had stopped midodrine.  After thorough discussion of risks and benefits of hypotensive medication patient and son are okay with staying off midodrine this time.  Iron deficiency anemia  Hx symptomatic anemia Patient reports that he is still feeling fatigued after his hospitalization.  He has a history of iron deficiency anemia for which she used to take iron supplementation, but was stopped during his hospitalization.  Hemoglobin was 7.9 at discharge.  We discussed checking his hemoglobin today and patient is in agreement.  Paroxysmal A. fib Currently on Eliquis therapy.  Compliant without side effects.  Underweight Patient's weight has remained stable around 100 pounds.  Patient and caregivers report that he is eating many meals a day, and he will likely stay near this weight.  We extensively discussed expectations for weight and physical functioning.  We do not see need for extensive interventions at this time.   Hx CVA  Dysphagia Patient had speech difficulty upon admission.  He was not found to have an acute infarct, and his strokelike symptoms were attributed to his severe anemia.  He has an appointment for swallow study on September 22.  Both the patient and son endorse no difficulty swallowing solids or liquids.  Minor trouble swallowing pills, however he does not aspirate or choke.  Indwelling  catheter Patient has a history of BPH.  Used to take tamsulosin but stopped due to hypotension.  He is followed closely by urology and we will let them decide Foley and medication management.   Review of Systems  Constitutional:  Negative for chills, fever and weight loss.  Respiratory:  Negative for cough.   Cardiovascular:  Negative for chest pain, palpitations, orthopnea and leg swelling.  Gastrointestinal:  Negative for abdominal pain, blood in stool, constipation, diarrhea, nausea and vomiting.  Genitourinary:  Negative for dysuria, flank pain and hematuria.  Neurological:  Negative for dizziness, focal weakness, loss of consciousness, weakness and headaches.     Keith [name] is a 80 y.o. yo with history notable for adenocarcinoma of colon, history of CVA, paroxysmal A. Fib, symptomatic anemia presenting for management of chronic medical conditions.   PERTINENT  PMH / PSH/Family/Social History : Reviewed and updated.  OBJECTIVE:   BP (!) 87/41   Pulse 77   Ht 5\' 9"  (1.753 m)   Wt 100 lb 8 oz (45.6 kg)   SpO2 97%   BMI 14.84 kg/m   Today's weight:  Last Weight  Most recent update: 10/05/2020 10:52 AM    Weight  45.6 kg (100 lb 8 oz)            Review of prior weights: Filed Weights   10/05/20 1052  Weight: 100 lb 8 oz (45.6 kg)   Physical Exam Constitutional:      General: He is not in acute distress.    Appearance: Normal appearance. He is not ill-appearing.  HENT:  Head: Normocephalic and atraumatic.  Cardiovascular:     Rate and Rhythm: Normal rate. Rhythm irregular.     Pulses: Normal pulses.     Heart sounds: Normal heart sounds.  Pulmonary:     Effort: Pulmonary effort is normal. No respiratory distress.     Breath sounds: Normal breath sounds.  Abdominal:     General: Abdomen is flat. Bowel sounds are normal. There is no distension.     Palpations: Abdomen is soft.     Tenderness: There is no abdominal tenderness. There is no guarding.   Musculoskeletal:        General: Normal range of motion.  Skin:    General: Skin is warm and dry.     Coloration: Skin is not jaundiced or pale.     Findings: No erythema or rash.  Neurological:     General: No focal deficit present.     Mental Status: He is alert and oriented to person, place, and time.     ASSESSMENT/PLAN:   Hypotension Chronic history of hypertension previously treated with midodrine.  Midodrine was stopped at discharge on September 11.  Blood pressures remained above 80/40.  He has remained completely asymptomatic.  We extensively discussed the risks and benefits of treating his hypertension and patient prefers not to go on midodrine at this time.  We think this is reasonable and we will inform his home health nurses.  Paroxysmal atrial fibrillation (HCC) Extensively discussed risk and benefit of Eliquis treatment given his recent severe anemic episode.  He remains asymptomatic at this time.  Patient is comfortable with continued Eliquis therapy and we think this is reasonable. -Continue Eliquis  Symptomatic anemia History of iron deficiency anemia.  Not currently on iron supplementation.  Recently discharged on September 11 for severe symptomatic anemia.  Last hemoglobin was 7.9, with a threshold of 7 to transfuse.  He still remains fatigued post discharge. -Repeat CBC  Underweight Patient has remained stable at 100 pounds.  This is likely his new normal.  We extensively discussed the importance of nutrition.  Patient and caregivers report good appetite and hydration.  We do not see need for extensive intervention at this time. -Continue home diet       Leslie Dales, [place]

## 2020-10-05 NOTE — Assessment & Plan Note (Signed)
Patient has remained stable at 100 pounds.  This is likely his new normal.  We extensively discussed the importance of nutrition.  Patient and caregivers report good appetite and hydration.  We do not see need for extensive intervention at this time. -Continue home diet

## 2020-10-05 NOTE — Assessment & Plan Note (Signed)
>>  ASSESSMENT AND PLAN FOR HYPOTENSION WRITTEN ON 10/05/2020 11:51 AM BY Lavada Porteous H  Chronic history of hypertension previously treated with midodrine .  Midodrine  was stopped at discharge on September 11.  Blood pressures remained above 80/40.  He has remained completely asymptomatic.  We extensively discussed the risks and benefits of treating his hypertension and patient prefers not to go on midodrine  at this time.  We think this is reasonable and we will inform his home health nurses.

## 2020-10-05 NOTE — Patient Instructions (Signed)
Everything seems to be doing good, I will contact your therapy aide to discuss about your blood pressures and weight.  I feel this time that you are overall doing pretty good.  I do want to check your hemoglobin today and just make sure that we are doing good, if it is on the lower side I may want to have you come back in a week and have it rechecked to make sure it is not dropping.

## 2020-10-05 NOTE — Assessment & Plan Note (Addendum)
History of iron deficiency anemia.  Not currently on iron supplementation.  Recently discharged on September 11 for severe symptomatic anemia.  Last hemoglobin was 7.9, with a threshold of 7 to transfuse.  He still remains fatigued post discharge. -Repeat CBC

## 2020-10-05 NOTE — Telephone Encounter (Signed)
Spoke with patient's speech therapist who brought up concerns regarding patient's swallowing dysphagia with persistent weight loss.  In discussion today, patient had mentioned that he eats about 5-6 times per day with at least 2-3 of those being meals.  In discussion with the speech therapist, she states that she had the granddaughter (who is his caretaker) fill out his intake through an app and it appears like the patient is only eating about 2 meals per day, and small ones at that.  There is a discrepancy between what is reported in the app versus what the family was saying, patient states that he does not want to be on the schedule and that he wants to eat when he is hungry.  Patient has a swallowing study in the next few days and we will await the results of that.  Discussed with speech therapist about optimizing his intake/food based on the results.  Patient is to follow-up in the next 3 to 4 weeks with me and we will continue to monitor his weight.  I do feel that family could possibly benefit from a discussion with palliative care medicine, I will try to reach out to family and gauge their interest so that he can maintain a good quality of life.   Alias Villagran, DO

## 2020-10-06 ENCOUNTER — Other Ambulatory Visit: Payer: Self-pay | Admitting: *Deleted

## 2020-10-06 ENCOUNTER — Telehealth: Payer: Self-pay | Admitting: Family Medicine

## 2020-10-06 DIAGNOSIS — Z7901 Long term (current) use of anticoagulants: Secondary | ICD-10-CM | POA: Diagnosis not present

## 2020-10-06 DIAGNOSIS — N184 Chronic kidney disease, stage 4 (severe): Secondary | ICD-10-CM | POA: Diagnosis not present

## 2020-10-06 DIAGNOSIS — I4891 Unspecified atrial fibrillation: Secondary | ICD-10-CM | POA: Diagnosis not present

## 2020-10-06 DIAGNOSIS — H9190 Unspecified hearing loss, unspecified ear: Secondary | ICD-10-CM | POA: Diagnosis not present

## 2020-10-06 DIAGNOSIS — Z87891 Personal history of nicotine dependence: Secondary | ICD-10-CM | POA: Diagnosis not present

## 2020-10-06 DIAGNOSIS — Z466 Encounter for fitting and adjustment of urinary device: Secondary | ICD-10-CM | POA: Diagnosis not present

## 2020-10-06 DIAGNOSIS — Z483 Aftercare following surgery for neoplasm: Secondary | ICD-10-CM | POA: Diagnosis not present

## 2020-10-06 DIAGNOSIS — D631 Anemia in chronic kidney disease: Secondary | ICD-10-CM | POA: Diagnosis not present

## 2020-10-06 DIAGNOSIS — D649 Anemia, unspecified: Secondary | ICD-10-CM

## 2020-10-06 DIAGNOSIS — Z8673 Personal history of transient ischemic attack (TIA), and cerebral infarction without residual deficits: Secondary | ICD-10-CM | POA: Diagnosis not present

## 2020-10-06 DIAGNOSIS — E43 Unspecified severe protein-calorie malnutrition: Secondary | ICD-10-CM | POA: Diagnosis not present

## 2020-10-06 DIAGNOSIS — C434 Malignant melanoma of scalp and neck: Secondary | ICD-10-CM | POA: Diagnosis not present

## 2020-10-06 DIAGNOSIS — I2510 Atherosclerotic heart disease of native coronary artery without angina pectoris: Secondary | ICD-10-CM | POA: Diagnosis not present

## 2020-10-06 DIAGNOSIS — D630 Anemia in neoplastic disease: Secondary | ICD-10-CM | POA: Diagnosis not present

## 2020-10-06 DIAGNOSIS — Z9181 History of falling: Secondary | ICD-10-CM | POA: Diagnosis not present

## 2020-10-06 DIAGNOSIS — C19 Malignant neoplasm of rectosigmoid junction: Secondary | ICD-10-CM | POA: Diagnosis not present

## 2020-10-06 DIAGNOSIS — D509 Iron deficiency anemia, unspecified: Secondary | ICD-10-CM | POA: Diagnosis not present

## 2020-10-06 DIAGNOSIS — Z86010 Personal history of colonic polyps: Secondary | ICD-10-CM | POA: Diagnosis not present

## 2020-10-06 LAB — CBC
Hematocrit: 22.6 % — ABNORMAL LOW (ref 37.5–51.0)
Hemoglobin: 6.9 g/dL — CL (ref 13.0–17.7)
MCH: 27.7 pg (ref 26.6–33.0)
MCHC: 30.5 g/dL — ABNORMAL LOW (ref 31.5–35.7)
MCV: 91 fL (ref 79–97)
Platelets: 394 10*3/uL (ref 150–450)
RBC: 2.49 x10E6/uL — CL (ref 4.14–5.80)
RDW: 13.7 % (ref 11.6–15.4)
WBC: 7.6 10*3/uL (ref 3.4–10.8)

## 2020-10-06 NOTE — Telephone Encounter (Signed)
Call patient regarding critical Hgb result of 6.9. Patient was recently hospitalized for symptomatic anemia and at that time had a negative work-up. Patient had just woken up this morning when I talked with him, he states that he doesn't feel fatigued at the moment, but just woke up. Yesterday during our visit, patient had reported that he had fatigue that was persistent since his hospitalization. Discussed that we could repeat his CBC, set up an outpatient transfusion, or he could go to the ED if he didn't feel well. Patient felt that an outpatient transfusion would be okay going forward, we will coordinate this with the patient.   Patient has a history of adenocarcinoma in the transverse colon, sigmoid polyp with carcinoma involved in tubular  adenoma. Patient had right colectomy on 9/56/2130 with Dr. Leighton Ruff. During his last hospitalization, EGD showed benign-appearing esophageal stenosis and erosive gastropathy that was biopsied and showed slight chronic inflammation but no dysplasia. His colonoscopy showed a mid-sigmoid colon polyp that was resected, ileo-colonic anastomosis with erosion and erythema that was biopsied (biopsies showed no overt malignancy at the anastomosis and tubular adenoma without high-grade dysplasia in the sigmoid).  Patient had decided to continue on Eliquis after significant discussions in the hospital about risks and benefits.   Layaan Mott, DO

## 2020-10-06 NOTE — Progress Notes (Signed)
Verbal order per Dr. Alen Blew: Please place an order for I unit of [facility] with tylenol 650 mg po and benadryl 25 mg po for 9/22. Contacted patient's son to confirm app[t times for lab and transfusion. Son verbalized understanding of appts.  Orders for T&S and prepare confirmed with Beth in [facility]

## 2020-10-06 NOTE — Telephone Encounter (Signed)
Called and spoke with patient,  caregiver and son,  and daughter-in-law about plan.  I have  also spoken with the infusion center about transfusion.  They requested Dr. Alen Blew placed orders.  Message to Dr. Alen Blew sent.  We will also fax over physician orders.  Discussed time and date and location of infusion center visit.  Also discussed benefits risks of infusion as well as reasons to return to care including symptoms, melena, hematochezia.  All questions were answered.

## 2020-10-06 NOTE — Telephone Encounter (Signed)
Called infusion center- charge nurse will call back re: transfusion.  Dorris Singh, MD  Family Medicine Teaching Service

## 2020-10-07 ENCOUNTER — Inpatient Hospital Stay: Payer: Medicare Other

## 2020-10-07 ENCOUNTER — Ambulatory Visit (HOSPITAL_COMMUNITY)
Admission: RE | Admit: 2020-10-07 | Discharge: 2020-10-07 | Disposition: A | Discharge status: Home / Self Care | Payer: Medicare Other | Source: Ambulatory Visit | Attending: Family Medicine | Admitting: Family Medicine

## 2020-10-07 ENCOUNTER — Other Ambulatory Visit: Payer: Self-pay

## 2020-10-07 ENCOUNTER — Inpatient Hospital Stay: Payer: Medicare Other | Attending: Oncology

## 2020-10-07 ENCOUNTER — Telehealth: Payer: Self-pay | Admitting: *Deleted

## 2020-10-07 DIAGNOSIS — K219 Gastro-esophageal reflux disease without esophagitis: Secondary | ICD-10-CM | POA: Diagnosis not present

## 2020-10-07 DIAGNOSIS — C434 Malignant melanoma of scalp and neck: Secondary | ICD-10-CM

## 2020-10-07 DIAGNOSIS — Z85038 Personal history of other malignant neoplasm of large intestine: Secondary | ICD-10-CM | POA: Diagnosis not present

## 2020-10-07 DIAGNOSIS — D649 Anemia, unspecified: Secondary | ICD-10-CM

## 2020-10-07 DIAGNOSIS — D508 Other iron deficiency anemias: Secondary | ICD-10-CM | POA: Insufficient documentation

## 2020-10-07 DIAGNOSIS — R1310 Dysphagia, unspecified: Secondary | ICD-10-CM | POA: Diagnosis not present

## 2020-10-07 LAB — CBC WITH DIFFERENTIAL (CANCER CENTER ONLY)
Abs Immature Granulocytes: 0.06 10*3/uL (ref 0.00–0.07)
Basophils Absolute: 0 10*3/uL (ref 0.0–0.1)
Basophils Relative: 0 %
Eosinophils Absolute: 0.2 10*3/uL (ref 0.0–0.5)
Eosinophils Relative: 3 %
HCT: 19.6 % — ABNORMAL LOW (ref 39.0–52.0)
Hemoglobin: 6.1 g/dL — CL (ref 13.0–17.0)
Immature Granulocytes: 1 %
Lymphocytes Relative: 18 %
Lymphs Abs: 1.3 10*3/uL (ref 0.7–4.0)
MCH: 28.6 pg (ref 26.0–34.0)
MCHC: 31.1 g/dL (ref 30.0–36.0)
MCV: 92 fL (ref 80.0–100.0)
Monocytes Absolute: 0.7 10*3/uL (ref 0.1–1.0)
Monocytes Relative: 9 %
Neutro Abs: 5.3 10*3/uL (ref 1.7–7.7)
Neutrophils Relative %: 69 %
Platelet Count: 345 10*3/uL (ref 150–400)
RBC: 2.13 MIL/uL — ABNORMAL LOW (ref 4.22–5.81)
RDW: 14.5 % (ref 11.5–15.5)
WBC Count: 7.5 10*3/uL (ref 4.0–10.5)
nRBC: 0 % (ref 0.0–0.2)

## 2020-10-07 LAB — SAMPLE TO BLOOD BANK

## 2020-10-07 LAB — FERRITIN: Ferritin: 27 ng/mL (ref 24–336)

## 2020-10-07 LAB — IRON AND TIBC
Iron: 18 ug/dL — ABNORMAL LOW (ref 42–163)
Saturation Ratios: 6 % — ABNORMAL LOW (ref 20–55)
TIBC: 315 ug/dL (ref 202–409)
UIBC: 297 ug/dL (ref 117–376)

## 2020-10-07 LAB — PREPARE RBC (CROSSMATCH)

## 2020-10-07 MED ORDER — DIPHENHYDRAMINE HCL 25 MG PO CAPS
25.0000 mg | ORAL_CAPSULE | Freq: Once | ORAL | Status: AC
  Administered 2020-10-07: 25 mg via ORAL
  Filled 2020-10-07: qty 1

## 2020-10-07 MED ORDER — SODIUM CHLORIDE 0.9% IV SOLUTION
250.0000 mL | Freq: Once | INTRAVENOUS | Status: AC
  Administered 2020-10-07: 250 mL via INTRAVENOUS

## 2020-10-07 MED ORDER — ACETAMINOPHEN 325 MG PO TABS
650.0000 mg | ORAL_TABLET | Freq: Once | ORAL | Status: AC
  Administered 2020-10-07: 650 mg via ORAL
  Filled 2020-10-07: qty 2

## 2020-10-07 NOTE — Telephone Encounter (Signed)
CRITICAL VALUE STICKER  CRITICAL VALUE: Hgb 6.1  RECEIVER (on-site recipient of call): Velna Ochs RN  DATE & TIME NOTIFIED: 10/07/20 @ [phone]  MESSENGER (representative from lab): Hillary  MD NOTIFIED: Dr. Alen Blew  TIME OF NOTIFICATION:0850  RESPONSE:  Patient scheduled for blood transfusion today

## 2020-10-07 NOTE — Patient Instructions (Signed)
Blood Transfusion, Adult, Care After This sheet gives you information about how to care for yourself after your procedure. Your doctor may also give you more specific instructions. If you have problems or questions, contact your doctor. What can I expect after the procedure? After the procedure, it is common to have: Bruising and soreness at the IV site. A headache. Follow these instructions at home: Insertion site care   Follow instructions from your doctor about how to take care of your insertion site. This is where an IV tube was put into your vein. Make sure you: Wash your hands with soap and water before and after you change your bandage (dressing). If you cannot use soap and water, use hand sanitizer. Change your bandage as told by your doctor. Check your insertion site every day for signs of infection. Check for: Redness, swelling, or pain. Bleeding from the site. Warmth. Pus or a bad smell. General instructions Take over-the-counter and prescription medicines only as told by your doctor. Rest as told by your doctor. Go back to your normal activities as told by your doctor. Keep all follow-up visits as told by your doctor. This is important. Contact a doctor if: You have itching or red, swollen areas of skin (hives). You feel worried or nervous (anxious). You feel weak after doing your normal activities. You have redness, swelling, warmth, or pain around the insertion site. You have blood coming from the insertion site, and the blood does not stop with pressure. You have pus or a bad smell coming from the insertion site. Get help right away if: You have signs of a serious reaction. This may be coming from an allergy or the body's defense system (immune system). Signs include: Trouble breathing or shortness of breath. Swelling of the face or feeling warm (flushed). Fever or chills. Head, chest, or back pain. Dark pee (urine) or blood in the pee. Widespread rash. Fast  heartbeat. Feeling dizzy or light-headed. You may receive your blood transfusion in an outpatient setting. If so, you will be told whom to contact to report any reactions. These symptoms may be an emergency. Do not wait to see if the symptoms will go away. Get medical help right away. Call your local emergency services (911 in the [place] Do not drive yourself to the hospital. Summary Bruising and soreness at the IV site are common. Check your insertion site every day for signs of infection. Rest as told by your doctor. Go back to your normal activities as told by your doctor. Get help right away if you have signs of a serious reaction. This information is not intended to replace advice given to you by your health care provider. Make sure you discuss any questions you have with your health care provider. Document Revised: 04/29/2020 Document Reviewed: 06/27/2018 Elsevier Patient Education  2022 Elsevier Inc.  

## 2020-10-08 ENCOUNTER — Telehealth: Payer: Self-pay

## 2020-10-08 DIAGNOSIS — Z86010 Personal history of colonic polyps: Secondary | ICD-10-CM | POA: Diagnosis not present

## 2020-10-08 DIAGNOSIS — I4891 Unspecified atrial fibrillation: Secondary | ICD-10-CM | POA: Diagnosis not present

## 2020-10-08 DIAGNOSIS — Z466 Encounter for fitting and adjustment of urinary device: Secondary | ICD-10-CM | POA: Diagnosis not present

## 2020-10-08 DIAGNOSIS — I2510 Atherosclerotic heart disease of native coronary artery without angina pectoris: Secondary | ICD-10-CM | POA: Diagnosis not present

## 2020-10-08 DIAGNOSIS — Z9181 History of falling: Secondary | ICD-10-CM | POA: Diagnosis not present

## 2020-10-08 DIAGNOSIS — C19 Malignant neoplasm of rectosigmoid junction: Secondary | ICD-10-CM | POA: Diagnosis not present

## 2020-10-08 DIAGNOSIS — Z8673 Personal history of transient ischemic attack (TIA), and cerebral infarction without residual deficits: Secondary | ICD-10-CM | POA: Diagnosis not present

## 2020-10-08 DIAGNOSIS — D509 Iron deficiency anemia, unspecified: Secondary | ICD-10-CM | POA: Diagnosis not present

## 2020-10-08 DIAGNOSIS — D630 Anemia in neoplastic disease: Secondary | ICD-10-CM | POA: Diagnosis not present

## 2020-10-08 DIAGNOSIS — Z483 Aftercare following surgery for neoplasm: Secondary | ICD-10-CM | POA: Diagnosis not present

## 2020-10-08 DIAGNOSIS — H9190 Unspecified hearing loss, unspecified ear: Secondary | ICD-10-CM | POA: Diagnosis not present

## 2020-10-08 DIAGNOSIS — Z7901 Long term (current) use of anticoagulants: Secondary | ICD-10-CM | POA: Diagnosis not present

## 2020-10-08 DIAGNOSIS — N184 Chronic kidney disease, stage 4 (severe): Secondary | ICD-10-CM | POA: Diagnosis not present

## 2020-10-08 DIAGNOSIS — D631 Anemia in chronic kidney disease: Secondary | ICD-10-CM | POA: Diagnosis not present

## 2020-10-08 DIAGNOSIS — C434 Malignant melanoma of scalp and neck: Secondary | ICD-10-CM | POA: Diagnosis not present

## 2020-10-08 DIAGNOSIS — E43 Unspecified severe protein-calorie malnutrition: Secondary | ICD-10-CM | POA: Diagnosis not present

## 2020-10-08 DIAGNOSIS — Z87891 Personal history of nicotine dependence: Secondary | ICD-10-CM | POA: Diagnosis not present

## 2020-10-08 LAB — TYPE AND SCREEN
ABO/RH(D): O POS
Antibody Screen: NEGATIVE
Unit division: 0

## 2020-10-08 LAB — BPAM RBC
Blood Product Expiration Date: 202210252359
ISSUE DATE / TIME: 202209220942
Unit Type and Rh: 5100

## 2020-10-08 NOTE — Telephone Encounter (Signed)
Keith [name] from [place] calls nurse line regarding patient's low hemoglobin. Patient had transfusion yesterday, 9/23.  PT is requesting parameters for when they should not do therapy due to low BP and low hemoglobin.   Please advise.   Talbot Grumbling, RN

## 2020-10-10 ENCOUNTER — Other Ambulatory Visit: Payer: Self-pay | Admitting: Family Medicine

## 2020-10-11 DIAGNOSIS — Z9181 History of falling: Secondary | ICD-10-CM | POA: Diagnosis not present

## 2020-10-11 DIAGNOSIS — Z466 Encounter for fitting and adjustment of urinary device: Secondary | ICD-10-CM | POA: Diagnosis not present

## 2020-10-11 DIAGNOSIS — D509 Iron deficiency anemia, unspecified: Secondary | ICD-10-CM | POA: Diagnosis not present

## 2020-10-11 DIAGNOSIS — D631 Anemia in chronic kidney disease: Secondary | ICD-10-CM | POA: Diagnosis not present

## 2020-10-11 DIAGNOSIS — Z86010 Personal history of colonic polyps: Secondary | ICD-10-CM | POA: Diagnosis not present

## 2020-10-11 DIAGNOSIS — N184 Chronic kidney disease, stage 4 (severe): Secondary | ICD-10-CM | POA: Diagnosis not present

## 2020-10-11 DIAGNOSIS — I4891 Unspecified atrial fibrillation: Secondary | ICD-10-CM | POA: Diagnosis not present

## 2020-10-11 DIAGNOSIS — Z483 Aftercare following surgery for neoplasm: Secondary | ICD-10-CM | POA: Diagnosis not present

## 2020-10-11 DIAGNOSIS — D630 Anemia in neoplastic disease: Secondary | ICD-10-CM | POA: Diagnosis not present

## 2020-10-11 DIAGNOSIS — E43 Unspecified severe protein-calorie malnutrition: Secondary | ICD-10-CM | POA: Diagnosis not present

## 2020-10-11 DIAGNOSIS — I2510 Atherosclerotic heart disease of native coronary artery without angina pectoris: Secondary | ICD-10-CM | POA: Diagnosis not present

## 2020-10-11 DIAGNOSIS — Z8673 Personal history of transient ischemic attack (TIA), and cerebral infarction without residual deficits: Secondary | ICD-10-CM | POA: Diagnosis not present

## 2020-10-11 DIAGNOSIS — C434 Malignant melanoma of scalp and neck: Secondary | ICD-10-CM | POA: Diagnosis not present

## 2020-10-11 DIAGNOSIS — C19 Malignant neoplasm of rectosigmoid junction: Secondary | ICD-10-CM | POA: Diagnosis not present

## 2020-10-11 DIAGNOSIS — H9190 Unspecified hearing loss, unspecified ear: Secondary | ICD-10-CM | POA: Diagnosis not present

## 2020-10-11 DIAGNOSIS — Z7901 Long term (current) use of anticoagulants: Secondary | ICD-10-CM | POA: Diagnosis not present

## 2020-10-11 DIAGNOSIS — Z87891 Personal history of nicotine dependence: Secondary | ICD-10-CM | POA: Diagnosis not present

## 2020-10-12 DIAGNOSIS — Z483 Aftercare following surgery for neoplasm: Secondary | ICD-10-CM | POA: Diagnosis not present

## 2020-10-12 DIAGNOSIS — Z86010 Personal history of colonic polyps: Secondary | ICD-10-CM | POA: Diagnosis not present

## 2020-10-12 DIAGNOSIS — C434 Malignant melanoma of scalp and neck: Secondary | ICD-10-CM | POA: Diagnosis not present

## 2020-10-12 DIAGNOSIS — D631 Anemia in chronic kidney disease: Secondary | ICD-10-CM | POA: Diagnosis not present

## 2020-10-12 DIAGNOSIS — Z9181 History of falling: Secondary | ICD-10-CM | POA: Diagnosis not present

## 2020-10-12 DIAGNOSIS — Z8673 Personal history of transient ischemic attack (TIA), and cerebral infarction without residual deficits: Secondary | ICD-10-CM | POA: Diagnosis not present

## 2020-10-12 DIAGNOSIS — I4891 Unspecified atrial fibrillation: Secondary | ICD-10-CM | POA: Diagnosis not present

## 2020-10-12 DIAGNOSIS — Z466 Encounter for fitting and adjustment of urinary device: Secondary | ICD-10-CM | POA: Diagnosis not present

## 2020-10-12 DIAGNOSIS — D630 Anemia in neoplastic disease: Secondary | ICD-10-CM | POA: Diagnosis not present

## 2020-10-12 DIAGNOSIS — C19 Malignant neoplasm of rectosigmoid junction: Secondary | ICD-10-CM | POA: Diagnosis not present

## 2020-10-12 DIAGNOSIS — Z7901 Long term (current) use of anticoagulants: Secondary | ICD-10-CM | POA: Diagnosis not present

## 2020-10-12 DIAGNOSIS — Z87891 Personal history of nicotine dependence: Secondary | ICD-10-CM | POA: Diagnosis not present

## 2020-10-12 DIAGNOSIS — D509 Iron deficiency anemia, unspecified: Secondary | ICD-10-CM | POA: Diagnosis not present

## 2020-10-12 DIAGNOSIS — N184 Chronic kidney disease, stage 4 (severe): Secondary | ICD-10-CM | POA: Diagnosis not present

## 2020-10-12 DIAGNOSIS — I2510 Atherosclerotic heart disease of native coronary artery without angina pectoris: Secondary | ICD-10-CM | POA: Diagnosis not present

## 2020-10-12 DIAGNOSIS — H9190 Unspecified hearing loss, unspecified ear: Secondary | ICD-10-CM | POA: Diagnosis not present

## 2020-10-12 DIAGNOSIS — E43 Unspecified severe protein-calorie malnutrition: Secondary | ICD-10-CM | POA: Diagnosis not present

## 2020-10-14 DIAGNOSIS — C434 Malignant melanoma of scalp and neck: Secondary | ICD-10-CM | POA: Diagnosis not present

## 2020-10-14 DIAGNOSIS — Z466 Encounter for fitting and adjustment of urinary device: Secondary | ICD-10-CM | POA: Diagnosis not present

## 2020-10-14 DIAGNOSIS — Z8673 Personal history of transient ischemic attack (TIA), and cerebral infarction without residual deficits: Secondary | ICD-10-CM | POA: Diagnosis not present

## 2020-10-14 DIAGNOSIS — D631 Anemia in chronic kidney disease: Secondary | ICD-10-CM | POA: Diagnosis not present

## 2020-10-14 DIAGNOSIS — E43 Unspecified severe protein-calorie malnutrition: Secondary | ICD-10-CM | POA: Diagnosis not present

## 2020-10-14 DIAGNOSIS — D509 Iron deficiency anemia, unspecified: Secondary | ICD-10-CM | POA: Diagnosis not present

## 2020-10-14 DIAGNOSIS — N184 Chronic kidney disease, stage 4 (severe): Secondary | ICD-10-CM | POA: Diagnosis not present

## 2020-10-14 DIAGNOSIS — Z87891 Personal history of nicotine dependence: Secondary | ICD-10-CM | POA: Diagnosis not present

## 2020-10-14 DIAGNOSIS — Z86010 Personal history of colonic polyps: Secondary | ICD-10-CM | POA: Diagnosis not present

## 2020-10-14 DIAGNOSIS — Z7901 Long term (current) use of anticoagulants: Secondary | ICD-10-CM | POA: Diagnosis not present

## 2020-10-14 DIAGNOSIS — I2510 Atherosclerotic heart disease of native coronary artery without angina pectoris: Secondary | ICD-10-CM | POA: Diagnosis not present

## 2020-10-14 DIAGNOSIS — H9190 Unspecified hearing loss, unspecified ear: Secondary | ICD-10-CM | POA: Diagnosis not present

## 2020-10-14 DIAGNOSIS — Z9181 History of falling: Secondary | ICD-10-CM | POA: Diagnosis not present

## 2020-10-14 DIAGNOSIS — Z483 Aftercare following surgery for neoplasm: Secondary | ICD-10-CM | POA: Diagnosis not present

## 2020-10-14 DIAGNOSIS — C19 Malignant neoplasm of rectosigmoid junction: Secondary | ICD-10-CM | POA: Diagnosis not present

## 2020-10-14 DIAGNOSIS — I4891 Unspecified atrial fibrillation: Secondary | ICD-10-CM | POA: Diagnosis not present

## 2020-10-14 DIAGNOSIS — D630 Anemia in neoplastic disease: Secondary | ICD-10-CM | POA: Diagnosis not present

## 2020-10-15 ENCOUNTER — Telehealth: Payer: Self-pay | Admitting: Oncology

## 2020-10-15 DIAGNOSIS — D509 Iron deficiency anemia, unspecified: Secondary | ICD-10-CM | POA: Diagnosis not present

## 2020-10-15 DIAGNOSIS — I2510 Atherosclerotic heart disease of native coronary artery without angina pectoris: Secondary | ICD-10-CM | POA: Diagnosis not present

## 2020-10-15 DIAGNOSIS — D631 Anemia in chronic kidney disease: Secondary | ICD-10-CM | POA: Diagnosis not present

## 2020-10-15 DIAGNOSIS — Z87891 Personal history of nicotine dependence: Secondary | ICD-10-CM | POA: Diagnosis not present

## 2020-10-15 DIAGNOSIS — D630 Anemia in neoplastic disease: Secondary | ICD-10-CM | POA: Diagnosis not present

## 2020-10-15 DIAGNOSIS — Z8673 Personal history of transient ischemic attack (TIA), and cerebral infarction without residual deficits: Secondary | ICD-10-CM | POA: Diagnosis not present

## 2020-10-15 DIAGNOSIS — C19 Malignant neoplasm of rectosigmoid junction: Secondary | ICD-10-CM | POA: Diagnosis not present

## 2020-10-15 DIAGNOSIS — I4891 Unspecified atrial fibrillation: Secondary | ICD-10-CM | POA: Diagnosis not present

## 2020-10-15 DIAGNOSIS — E43 Unspecified severe protein-calorie malnutrition: Secondary | ICD-10-CM | POA: Diagnosis not present

## 2020-10-15 DIAGNOSIS — Z7901 Long term (current) use of anticoagulants: Secondary | ICD-10-CM | POA: Diagnosis not present

## 2020-10-15 DIAGNOSIS — H9190 Unspecified hearing loss, unspecified ear: Secondary | ICD-10-CM | POA: Diagnosis not present

## 2020-10-15 DIAGNOSIS — N184 Chronic kidney disease, stage 4 (severe): Secondary | ICD-10-CM | POA: Diagnosis not present

## 2020-10-15 DIAGNOSIS — Z86010 Personal history of colonic polyps: Secondary | ICD-10-CM | POA: Diagnosis not present

## 2020-10-15 DIAGNOSIS — Z466 Encounter for fitting and adjustment of urinary device: Secondary | ICD-10-CM | POA: Diagnosis not present

## 2020-10-15 DIAGNOSIS — C434 Malignant melanoma of scalp and neck: Secondary | ICD-10-CM | POA: Diagnosis not present

## 2020-10-15 DIAGNOSIS — Z9181 History of falling: Secondary | ICD-10-CM | POA: Diagnosis not present

## 2020-10-15 DIAGNOSIS — Z483 Aftercare following surgery for neoplasm: Secondary | ICD-10-CM | POA: Diagnosis not present

## 2020-10-15 NOTE — Telephone Encounter (Signed)
Called patient regarding upcoming October appointments, left a voicemail.  

## 2020-10-18 ENCOUNTER — Telehealth: Payer: Self-pay

## 2020-10-18 DIAGNOSIS — Z9181 History of falling: Secondary | ICD-10-CM | POA: Diagnosis not present

## 2020-10-18 DIAGNOSIS — D630 Anemia in neoplastic disease: Secondary | ICD-10-CM | POA: Diagnosis not present

## 2020-10-18 DIAGNOSIS — H9190 Unspecified hearing loss, unspecified ear: Secondary | ICD-10-CM | POA: Diagnosis not present

## 2020-10-18 DIAGNOSIS — Z483 Aftercare following surgery for neoplasm: Secondary | ICD-10-CM | POA: Diagnosis not present

## 2020-10-18 DIAGNOSIS — Z466 Encounter for fitting and adjustment of urinary device: Secondary | ICD-10-CM | POA: Diagnosis not present

## 2020-10-18 DIAGNOSIS — Z87891 Personal history of nicotine dependence: Secondary | ICD-10-CM | POA: Diagnosis not present

## 2020-10-18 DIAGNOSIS — Z8673 Personal history of transient ischemic attack (TIA), and cerebral infarction without residual deficits: Secondary | ICD-10-CM | POA: Diagnosis not present

## 2020-10-18 DIAGNOSIS — Z7901 Long term (current) use of anticoagulants: Secondary | ICD-10-CM | POA: Diagnosis not present

## 2020-10-18 DIAGNOSIS — D631 Anemia in chronic kidney disease: Secondary | ICD-10-CM | POA: Diagnosis not present

## 2020-10-18 DIAGNOSIS — Z86010 Personal history of colonic polyps: Secondary | ICD-10-CM | POA: Diagnosis not present

## 2020-10-18 DIAGNOSIS — I2510 Atherosclerotic heart disease of native coronary artery without angina pectoris: Secondary | ICD-10-CM | POA: Diagnosis not present

## 2020-10-18 DIAGNOSIS — N184 Chronic kidney disease, stage 4 (severe): Secondary | ICD-10-CM | POA: Diagnosis not present

## 2020-10-18 DIAGNOSIS — D509 Iron deficiency anemia, unspecified: Secondary | ICD-10-CM | POA: Diagnosis not present

## 2020-10-18 DIAGNOSIS — C434 Malignant melanoma of scalp and neck: Secondary | ICD-10-CM | POA: Diagnosis not present

## 2020-10-18 DIAGNOSIS — C19 Malignant neoplasm of rectosigmoid junction: Secondary | ICD-10-CM | POA: Diagnosis not present

## 2020-10-18 DIAGNOSIS — I4891 Unspecified atrial fibrillation: Secondary | ICD-10-CM | POA: Diagnosis not present

## 2020-10-18 DIAGNOSIS — E43 Unspecified severe protein-calorie malnutrition: Secondary | ICD-10-CM | POA: Diagnosis not present

## 2020-10-18 NOTE — Telephone Encounter (Signed)
[facility] OT calls nurse line requesting verbal orders for [facility] OT.  1x a week for 4 weeks   Verbal order given per [facility] protocol.

## 2020-10-20 ENCOUNTER — Other Ambulatory Visit: Payer: Self-pay | Admitting: Family Medicine

## 2020-10-20 ENCOUNTER — Other Ambulatory Visit: Payer: Self-pay

## 2020-10-20 ENCOUNTER — Encounter: Payer: Self-pay | Admitting: Family Medicine

## 2020-10-20 ENCOUNTER — Ambulatory Visit (INDEPENDENT_AMBULATORY_CARE_PROVIDER_SITE_OTHER): Payer: Medicare Other | Admitting: Family Medicine

## 2020-10-20 VITALS — BP 97/53 | HR 92 | Ht 69.0 in | Wt 104.4 lb

## 2020-10-20 DIAGNOSIS — R5383 Other fatigue: Secondary | ICD-10-CM | POA: Diagnosis not present

## 2020-10-20 DIAGNOSIS — H9190 Unspecified hearing loss, unspecified ear: Secondary | ICD-10-CM | POA: Diagnosis not present

## 2020-10-20 DIAGNOSIS — D649 Anemia, unspecified: Secondary | ICD-10-CM | POA: Diagnosis not present

## 2020-10-20 DIAGNOSIS — D500 Iron deficiency anemia secondary to blood loss (chronic): Secondary | ICD-10-CM | POA: Diagnosis not present

## 2020-10-20 DIAGNOSIS — R636 Underweight: Secondary | ICD-10-CM | POA: Diagnosis not present

## 2020-10-20 DIAGNOSIS — C434 Malignant melanoma of scalp and neck: Secondary | ICD-10-CM | POA: Diagnosis not present

## 2020-10-20 DIAGNOSIS — D631 Anemia in chronic kidney disease: Secondary | ICD-10-CM

## 2020-10-20 DIAGNOSIS — E559 Vitamin D deficiency, unspecified: Secondary | ICD-10-CM | POA: Diagnosis not present

## 2020-10-20 DIAGNOSIS — I2510 Atherosclerotic heart disease of native coronary artery without angina pectoris: Secondary | ICD-10-CM | POA: Diagnosis not present

## 2020-10-20 DIAGNOSIS — I951 Orthostatic hypotension: Secondary | ICD-10-CM | POA: Diagnosis not present

## 2020-10-20 DIAGNOSIS — C184 Malignant neoplasm of transverse colon: Secondary | ICD-10-CM | POA: Diagnosis not present

## 2020-10-20 DIAGNOSIS — I959 Hypotension, unspecified: Secondary | ICD-10-CM | POA: Diagnosis not present

## 2020-10-20 DIAGNOSIS — C19 Malignant neoplasm of rectosigmoid junction: Secondary | ICD-10-CM | POA: Diagnosis not present

## 2020-10-20 DIAGNOSIS — Z87891 Personal history of nicotine dependence: Secondary | ICD-10-CM | POA: Diagnosis not present

## 2020-10-20 DIAGNOSIS — N184 Chronic kidney disease, stage 4 (severe): Secondary | ICD-10-CM

## 2020-10-20 DIAGNOSIS — I480 Paroxysmal atrial fibrillation: Secondary | ICD-10-CM | POA: Diagnosis not present

## 2020-10-20 DIAGNOSIS — E43 Unspecified severe protein-calorie malnutrition: Secondary | ICD-10-CM | POA: Diagnosis not present

## 2020-10-20 DIAGNOSIS — K219 Gastro-esophageal reflux disease without esophagitis: Secondary | ICD-10-CM | POA: Diagnosis not present

## 2020-10-20 DIAGNOSIS — E890 Postprocedural hypothyroidism: Secondary | ICD-10-CM | POA: Diagnosis not present

## 2020-10-20 DIAGNOSIS — Z9181 History of falling: Secondary | ICD-10-CM | POA: Diagnosis not present

## 2020-10-20 DIAGNOSIS — Z7901 Long term (current) use of anticoagulants: Secondary | ICD-10-CM | POA: Diagnosis not present

## 2020-10-20 DIAGNOSIS — D630 Anemia in neoplastic disease: Secondary | ICD-10-CM | POA: Diagnosis not present

## 2020-10-20 DIAGNOSIS — Z466 Encounter for fitting and adjustment of urinary device: Secondary | ICD-10-CM | POA: Diagnosis not present

## 2020-10-20 LAB — POCT HEMOGLOBIN: Hemoglobin: 7.1 g/dL — AB (ref 11–14.6)

## 2020-10-20 NOTE — Assessment & Plan Note (Signed)
Patient transfused on 9/22, with initial improvement in symptoms. Symptomatic today with weakness and fatigue, POCT hemoglobin 7.1 with a transfusion threshold of 7. Concerned that patient may need another transfusion in the near future. No known sources of bleeding, consideration that it could be a bone marrow issue or related to his chronic kidney disease. Patient may need a EPO stimulating medication, will refer to nephrology and discussed this with family. Discussed consideration to discontinue Eliquis, but patient more concerned about stroke risk than bleeding; discussed following-up with cardiology to discuss further continuation or not. We will re-check hemoglobin in 2 days with a point of care and CBC and schedule an infusion with assistance from Dr. Alen Blew if necessary. - Restarted iron supplementation - POCT hemoglobin and CBC to be drawn on 9/7, will determine if transfusion necessary - Patient to follow-up with nephrology, cardiology, and oncology - Follow-up in 4 weeks or sooner

## 2020-10-20 NOTE — Assessment & Plan Note (Signed)
>>  ASSESSMENT AND PLAN FOR HYPOTENSION WRITTEN ON 10/20/2020  3:41 PM BY Bud Care, DO  Chronic hypotension, BP today 97/53 and patient asymptomatic. Discussed with patient and family that they can resume Midodrine  as needed for his activities with home health.

## 2020-10-20 NOTE — Patient Instructions (Addendum)
Your hemoglobin is still low and I want to do a repeat check on Friday, you can come in at 8:30 AM to get this test done.  We will go ahead and get working on trying to get a transfusion set up for next week and we will update you as we can.  When you go to the nephrologist I want you to talk to them about the anemia that has been happening as well.  The GI doctors did not find any obvious signs of bleeding when they did their exams and if we need to consider discussing options with the kidney doctor.  I want you to go ahead and restart your iron supplement at home.  Something to consider as if we need to continue on the Eliquis or not or if it needs to be at this dose.  I want you to follow-up with your cardiologist and see if they have any input on this.  I am also putting in a referral to palliative medicine to help [place] with coordinating quality of life measures.      Therapy and Counseling Resources Most providers on this list will take Medicaid. Patients with commercial insurance or Medicare should contact their insurance company to get a list of in network providers.  [facility]:Psychiatry and Counseling [address] [facility] [address]. [place], [place] [address], [place], [place] [address]      [place] [address]  [place], [place] [address] [phone]  [place] [address] [place]  [place], [place] [address]       [phone]     [facility] (virtual only) [phone]  [facility] Bl[facility] [address] [place], [place]  [facility]:  [place]. [place] [phone]  [facility]:  [place] [address] [phone]  [facility] (under & uninsured) [address] [place] [phone]    [email or web address]    [place] [address]  [place]    [phone]  [facility] [place]     Phone:  [phone]     [place]  [place] [address]      [place], [place] [address] [place]: [name], [place], [place]   [place] ([place] therapist) [email or web address]  [place] [address]   [place] [address]    [phone]    [facility]   [address],  [place], [place]   [place]  [place]  [facility]  [address] [place], [place]        [phone]  Open Access/Walk In Clinic under & uninsured  [facility]  [address] [place], [place] Crisis [phone]  [facility] [place],  ([place])   [place], [place]: [phone]) 8:30 - 12; 1 - 2:30  [facility] of the [place],  [place], [place]    [phone]):8:30 - 12; 2 - 3PM  [facility] [employer],  [address],  [place]; [phone]):   Mon - Fri  8 AM - 5 PM  [facility] [place]  MWF 12:30 to 3:00 or call to schedule an appointment  [phone]  Specific Provider options Psychology Today  [email or web address] click on find a therapist  enter your zip code left side and select or tailor a therapist for your specific need.   [facility] Provider Directory [email or web address]  (Medicaid)   Follow all drop down to find a provider  [place] or [email or web address] [address], [place], [place] Recovery support and educational   24- Hour Availability:   [facility]   [address] [place], [place] Crisis [phone]  [facility] [employer] [phone]  [place]  [phone]   [place]  [phone] (after hours)  Therapeutic Alternative/Mobile Crisis   [phone]  [place] National Suicide Hotline  (660)[phone] Diamantina Monks)  Call 911 or go to emergency room  [facility]  [phone]);  [place] and [employer]  [phone]); [place], [place], [place], [place], [place], [place], [place]

## 2020-10-20 NOTE — Progress Notes (Signed)
    SUBJECTIVE:   CHIEF COMPLAINT / HPI:   Patient reports that he is feeling more fatigued and weaker lately than usual. He had his transfusion on 9/22 and initially was feeling much better. About a week later, patient began to start feeling fatigued again, which has continued to worsen. He is not having any dizziness, vision changes or signs of hypotension. He reports no melena or hematochezia. He has not been on his iron supplement as it was discontinued initially when discharged from the hospital last month. Patient still wishes to remain on Eliquis as he is more concerned with stroke risk (as it will be more debilitating) than bleeding. He has not seen his cardiologist in some time.  Patient has follow-up appointments with his nephrologist on 9/17, oncologist 9/20, and dermatologist on 9/26.    PERTINENT  PMH / PSH: CVA, paroxysmal Afib, hypotension, colon cancer, CKD stage 4, anemia, SAH  OBJECTIVE:   BP (!) 97/53   Pulse 92   Ht 5\' 9"  (1.753 m)   Wt 104 lb 6.4 oz (47.4 kg)   SpO2 100%   BMI 15.42 kg/m   Gen: well-appearing, NAD, pale extremities and pale conjunctiva CV: RRR, no m/r/g appreciated, no peripheral edema Pulm: CTAB, no wheezes/crackles  ASSESSMENT/PLAN:   Symptomatic anemia Patient transfused on 9/22, with initial improvement in symptoms. Symptomatic today with weakness and fatigue, POCT hemoglobin 7.1 with a transfusion threshold of 7. Concerned that patient may need another transfusion in the near future. No known sources of bleeding, consideration that it could be a bone marrow issue or related to his chronic kidney disease. Patient may need a EPO stimulating medication, will refer to nephrology and discussed this with family. Discussed consideration to discontinue Eliquis, but patient more concerned about stroke risk than bleeding; discussed following-up with cardiology to discuss further continuation or not. We will re-check hemoglobin in 2 days with a point of  care and CBC and schedule an infusion with assistance from Dr. Alen Blew if necessary. - Restarted iron supplementation - POCT hemoglobin and CBC to be drawn on 9/7, will determine if transfusion necessary - Patient to follow-up with nephrology, cardiology, and oncology - Follow-up in 4 weeks or sooner  Underweight Weight today 104lb 6.4oz, improved from last visit. Patient to continue current nutrition as able.  Hypotension Chronic hypotension, BP today 97/53 and patient asymptomatic. Discussed with patient and family that they can resume Midodrine as needed for his activities with home health.   Palliative Medicine Discussion Discussed with family and patient about the want to maximize the quality of life at this time. Patient has multiple chronic conditions that include CKD, colon cancer, hx of CVA and SAH, paroxysmal atrial fibrillation. Feel that a discussion regarding goals of medical treatment and evaluation would be beneficial for patient and the family. They are agreeable to this and would like to discuss with palliative medicine. - Referral to palliative medicine placed.   Rise Patience, [place]

## 2020-10-20 NOTE — Assessment & Plan Note (Signed)
Chronic hypotension, BP today 97/53 and patient asymptomatic. Discussed with patient and family that they can resume Midodrine as needed for his activities with home health.

## 2020-10-20 NOTE — Assessment & Plan Note (Signed)
Weight today 104lb 6.4oz, improved from last visit. Patient to continue current nutrition as able.

## 2020-10-21 DIAGNOSIS — C19 Malignant neoplasm of rectosigmoid junction: Secondary | ICD-10-CM | POA: Diagnosis not present

## 2020-10-21 DIAGNOSIS — I480 Paroxysmal atrial fibrillation: Secondary | ICD-10-CM | POA: Diagnosis not present

## 2020-10-21 DIAGNOSIS — E559 Vitamin D deficiency, unspecified: Secondary | ICD-10-CM | POA: Diagnosis not present

## 2020-10-21 DIAGNOSIS — K219 Gastro-esophageal reflux disease without esophagitis: Secondary | ICD-10-CM | POA: Diagnosis not present

## 2020-10-21 DIAGNOSIS — Z7901 Long term (current) use of anticoagulants: Secondary | ICD-10-CM | POA: Diagnosis not present

## 2020-10-21 DIAGNOSIS — D630 Anemia in neoplastic disease: Secondary | ICD-10-CM | POA: Diagnosis not present

## 2020-10-21 DIAGNOSIS — Z9181 History of falling: Secondary | ICD-10-CM | POA: Diagnosis not present

## 2020-10-21 DIAGNOSIS — D500 Iron deficiency anemia secondary to blood loss (chronic): Secondary | ICD-10-CM | POA: Diagnosis not present

## 2020-10-21 DIAGNOSIS — D631 Anemia in chronic kidney disease: Secondary | ICD-10-CM | POA: Diagnosis not present

## 2020-10-21 DIAGNOSIS — Z87891 Personal history of nicotine dependence: Secondary | ICD-10-CM | POA: Diagnosis not present

## 2020-10-21 DIAGNOSIS — Z466 Encounter for fitting and adjustment of urinary device: Secondary | ICD-10-CM | POA: Diagnosis not present

## 2020-10-21 DIAGNOSIS — H9190 Unspecified hearing loss, unspecified ear: Secondary | ICD-10-CM | POA: Diagnosis not present

## 2020-10-21 DIAGNOSIS — E890 Postprocedural hypothyroidism: Secondary | ICD-10-CM | POA: Diagnosis not present

## 2020-10-21 DIAGNOSIS — I2510 Atherosclerotic heart disease of native coronary artery without angina pectoris: Secondary | ICD-10-CM | POA: Diagnosis not present

## 2020-10-21 DIAGNOSIS — N184 Chronic kidney disease, stage 4 (severe): Secondary | ICD-10-CM | POA: Diagnosis not present

## 2020-10-21 DIAGNOSIS — E43 Unspecified severe protein-calorie malnutrition: Secondary | ICD-10-CM | POA: Diagnosis not present

## 2020-10-21 DIAGNOSIS — C434 Malignant melanoma of scalp and neck: Secondary | ICD-10-CM | POA: Diagnosis not present

## 2020-10-21 DIAGNOSIS — I951 Orthostatic hypotension: Secondary | ICD-10-CM | POA: Diagnosis not present

## 2020-10-22 ENCOUNTER — Telehealth: Payer: Self-pay

## 2020-10-22 ENCOUNTER — Other Ambulatory Visit: Payer: Medicare Other

## 2020-10-22 NOTE — Telephone Encounter (Signed)
Spoke with patient's DIL Trish and scheduled an in-person Palliative Consult for 11/10/20 @ 1:30PM  with Dr. Hollace Kinnier. Documentation will be noted in [place].   COVID screening was negative. No pets in home. Patient's granddaughter Faith lives with him.   Consent obtained; updated Outlook/Netsmart/Team List and Epic.   Family is aware they may be receiving a call from provider the day before or day of to confirm appointment.

## 2020-10-25 ENCOUNTER — Emergency Department (HOSPITAL_COMMUNITY)
Admission: EM | Admit: 2020-10-25 | Discharge: 2020-10-25 | Disposition: A | Discharge status: Home / Self Care | Payer: Medicare Other | Attending: Emergency Medicine | Admitting: Emergency Medicine

## 2020-10-25 ENCOUNTER — Other Ambulatory Visit (INDEPENDENT_AMBULATORY_CARE_PROVIDER_SITE_OTHER): Payer: Medicare Other

## 2020-10-25 ENCOUNTER — Other Ambulatory Visit: Payer: Self-pay

## 2020-10-25 ENCOUNTER — Telehealth: Payer: Self-pay | Admitting: Family Medicine

## 2020-10-25 DIAGNOSIS — R531 Weakness: Secondary | ICD-10-CM | POA: Insufficient documentation

## 2020-10-25 DIAGNOSIS — Z87891 Personal history of nicotine dependence: Secondary | ICD-10-CM | POA: Diagnosis not present

## 2020-10-25 DIAGNOSIS — E039 Hypothyroidism, unspecified: Secondary | ICD-10-CM | POA: Insufficient documentation

## 2020-10-25 DIAGNOSIS — D649 Anemia, unspecified: Secondary | ICD-10-CM

## 2020-10-25 DIAGNOSIS — Z85038 Personal history of other malignant neoplasm of large intestine: Secondary | ICD-10-CM | POA: Insufficient documentation

## 2020-10-25 DIAGNOSIS — R42 Dizziness and giddiness: Secondary | ICD-10-CM | POA: Insufficient documentation

## 2020-10-25 DIAGNOSIS — I480 Paroxysmal atrial fibrillation: Secondary | ICD-10-CM | POA: Insufficient documentation

## 2020-10-25 DIAGNOSIS — N184 Chronic kidney disease, stage 4 (severe): Secondary | ICD-10-CM | POA: Diagnosis not present

## 2020-10-25 DIAGNOSIS — Z85820 Personal history of malignant melanoma of skin: Secondary | ICD-10-CM | POA: Diagnosis not present

## 2020-10-25 DIAGNOSIS — R7989 Other specified abnormal findings of blood chemistry: Secondary | ICD-10-CM | POA: Diagnosis not present

## 2020-10-25 DIAGNOSIS — Z7901 Long term (current) use of anticoagulants: Secondary | ICD-10-CM | POA: Diagnosis not present

## 2020-10-25 LAB — CBC
HCT: 21.2 % — ABNORMAL LOW (ref 39.0–52.0)
Hemoglobin: 6.3 g/dL — CL (ref 13.0–17.0)
MCH: 28 pg (ref 26.0–34.0)
MCHC: 29.7 g/dL — ABNORMAL LOW (ref 30.0–36.0)
MCV: 94.2 fL (ref 80.0–100.0)
Platelets: 338 10*3/uL (ref 150–400)
RBC: 2.25 MIL/uL — ABNORMAL LOW (ref 4.22–5.81)
RDW: 14.3 % (ref 11.5–15.5)
WBC: 8.8 10*3/uL (ref 4.0–10.5)
nRBC: 0 % (ref 0.0–0.2)

## 2020-10-25 LAB — BASIC METABOLIC PANEL
Anion gap: 7 (ref 5–15)
BUN: 30 mg/dL — ABNORMAL HIGH (ref 8–23)
CO2: 24 mmol/L (ref 22–32)
Calcium: 9.1 mg/dL (ref 8.9–10.3)
Chloride: 105 mmol/L (ref 98–111)
Creatinine, Ser: 2.12 mg/dL — ABNORMAL HIGH (ref 0.61–1.24)
GFR, Estimated: 31 mL/min — ABNORMAL LOW (ref >60)
Glucose, Bld: 120 mg/dL — ABNORMAL HIGH (ref 70–99)
Potassium: 4.7 mmol/L (ref 3.5–5.1)
Sodium: 136 mmol/L (ref 135–145)

## 2020-10-25 LAB — PROTIME-INR
INR: 1 (ref 0.8–1.2)
Prothrombin Time: 13.4 seconds (ref 11.4–15.2)

## 2020-10-25 LAB — POCT HEMOGLOBIN: Hemoglobin: 6.3 g/dL — AB (ref 11–14.6)

## 2020-10-25 LAB — PREPARE RBC (CROSSMATCH)

## 2020-10-25 LAB — POC OCCULT BLOOD, ED: Fecal Occult Bld: POSITIVE — AB

## 2020-10-25 MED ORDER — LACTATED RINGERS IV BOLUS
500.0000 mL | Freq: Once | INTRAVENOUS | Status: AC
  Administered 2020-10-25: 500 mL via INTRAVENOUS

## 2020-10-25 MED ORDER — SODIUM CHLORIDE 0.9 % IV SOLN
10.0000 mL/h | Freq: Once | INTRAVENOUS | Status: AC
  Administered 2020-10-25: 10 mL/h via INTRAVENOUS

## 2020-10-25 NOTE — ED Notes (Signed)
Hgb. 6.2

## 2020-10-25 NOTE — ED Provider Notes (Addendum)
[facility] EM[facility]Y DEPARTMENT Provider Note   CSN: [id number] Arrival date & time: 10/25/20  8115     History Chief Complaint  Patient presents with   Dizziness    Keith [name] is a 80 y.o. male with history of symptomatic anemia of unknown etiology who presents today with lightheadedness with change in position, feeling cold, and generalized weakness.  Outpatient hemoglobin at PCPs office was 6.3.  I personally read this patient's medical records.  He has history of CVA on Eliquis, melanoma of the scalp, adenocarcinoma of the colon status postsurgical resection, and CKD.  He has undergone scopes with GI to evaluate for source of bleeding without clear etiology.  Has been referred to nephrology in the outpatient setting to evaluate for possible bone marrow versus EKG etiology for his recurring anemia in 09/2020.  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   Melanoma ([place])    scalp   Melanoma of scalp ([place]) 04/23/2017   Obstructive uropathy    PONV (postoperative nausea and vomiting) 03/20/2017   Stroke ([place]) 11/2016   speech and right side at time of stroke 04/20/17- all function return to normal   Underweight 01/24/2018    Patient Active Problem List   Diagnosis Date Noted   Benign esophageal stricture    Acute blood loss anemia 09/22/2020   Difficulty with speech    Orthostatic hypotension 09/05/2020   Internal hernia 08/13/2020   Encounter for assessment for small bowel obstruciton 08/13/2020    Protein-calorie malnutrition, severe 08/11/2020   Cancer of transverse colon ([place]) 08/06/2020   Coronary artery calcification 05/25/2020   Adenocarcinoma ([place]) 05/22/2019   Mass of colon    Symptomatic anemia 05/08/2019   Gastroesophageal reflux disease 02/18/2019   Iron deficiency 09/26/2018   Anemia in stage 4 chronic kidney disease ([place]) 05/21/2018   Vitamin D deficiency 05/21/2018   History of CVA (cerebrovascular accident) 01/31/2018   TSH elevation 01/28/2018   Underweight 01/24/2018   CKD (chronic kidney disease) stage 4, GFR 15-29 ml/min (HCC) 12/10/2017   Hypotension 12/10/2017   Paroxysmal atrial fibrillation ([place]) 11/07/2017   Bilateral carotid artery stenosis 09/03/2017   Malignant melanoma of skin of scalp ([place]) 03/26/2017   Hyperlipidemia 02/08/2017   SAH (subarachnoid hemorrhage) ([place]) 11/20/2016   Cerebrovascular accident (CVA) due to occlusion of left carotid artery ([place]) 11/20/2016    Past Surgical History:  Procedure Laterality Date   APPLICATION OF A-CELL OF HEAD/NECK N/A 04/23/2017   Procedure: APPLICATION OF INTREGRA;  Surgeon: Irene Limbo, MD;  Location: [place];  Service: Plastics;  Laterality: N/A;   BIOPSY  05/10/2019   Procedure: BIOPSY;  Surgeon: [name], MD;  Location: [facility] ENDOSCOPY;  Service: Endoscopy;;   BIOPSY  09/23/2020   Procedure: BIOPSY;  Surgeon: [name], MD;  Location: [facility] ENDOSCOPY;  Service: Endoscopy;;   BIOPSY  09/24/2020   Procedure: BIOPSY;  Surgeon: [name], MD;  Location: [facility] ENDOSCOPY;  Service: Endoscopy;;  CATARACT EXTRACTION     COLONOSCOPY WITH PROPOFOL N/A 05/10/2019   Procedure: COLONOSCOPY WITH PROPOFOL;  Surgeon: [name], MD;  Location: [facility] ENDOSCOPY;  Service: Endoscopy;  Laterality: N/A;   COLONOSCOPY WITH PROPOFOL N/A 09/24/2020   Procedure: COLONOSCOPY WITH PROPOFOL;  Surgeon: [name], MD;  Location: [facility] ENDOSCOPY;  Service: Endoscopy;  Laterality: N/A;   ESOPHAGOGASTRODUODENOSCOPY (EGD) WITH  PROPOFOL N/A 05/10/2019   Procedure: ESOPHAGOGASTRODUODENOSCOPY (EGD) WITH PROPOFOL;  Surgeon: [name], MD;  Location: [facility] ENDOSCOPY;  Service: Endoscopy;  Laterality: N/A;   ESOPHAGOGASTRODUODENOSCOPY (EGD) WITH PROPOFOL N/A 09/23/2020   Procedure: ESOPHAGOGASTRODUODENOSCOPY (EGD) WITH PROPOFOL;  Surgeon: [name], MD;  Location: [facility] ENDOSCOPY;  Service: Endoscopy;  Laterality: N/A;   EXCISION MASS HEAD N/A 03/20/2017   Procedure: EXCISION scalp lesion;  Surgeon: Irene Limbo, MD;  Location: [place];  Service: Plastics;  Laterality: N/A;  EXCISION scalp lesion   EYE SURGERY     IR ANGIO INTRA EXTRACRAN SEL COM CAROTID INNOMINATE BILAT MOD SED  11/23/2016   IR ANGIO VERTEBRAL SEL VERTEBRAL UNI L MOD SED  11/23/2016   LOOP RECORDER INSERTION N/A 11/23/2016   Procedure: LOOP RECORDER INSERTION;  Surgeon: [name], MD;  Location: [place] CV LAB;  Service: Cardiovascular;  Laterality: N/A;   MASS EXCISION N/A 04/23/2017   Procedure: EXCISION MALIGNANT LESION OF SCALP;  Surgeon: Irene Limbo, MD;  Location: [place];  Service: Plastics;  Laterality: N/A;   MELANOMA EXCISION  04/23/2017   BILATERAL SUPERFICIAL PAROTIDECTOMY/NECK SENTINEL NODE BIOPSY WITH FROZEN SECTION; LEFT SELECTIVE NECK DISSECTION   PAROTIDECTOMY Bilateral 04/23/2017   Procedure: BILATERAL SUPERFICIAL PAROTIDECTOMY/NECK SENTINEL NODE BIOPSY WITH FROZEN SECTION; LEFT SELECTIVE NECK DISSECTION;  Surgeon: [name], MD;  Location: [place];  Service: ENT;  Laterality: Bilateral;   POLYPECTOMY  05/10/2019   Procedure: POLYPECTOMY;  Surgeon: [name], MD;  Location: [facility] ENDOSCOPY;  Service: Endoscopy;;   POLYPECTOMY  09/24/2020   Procedure: POLYPECTOMY;  Surgeon: [name], MD;  Location: [place];  Service: Endoscopy;;   SKIN GRAFT SPLIT THICKNESS HEAD / NECK  06/04/2017   SKIN GRAFT SPLIT THICKNESS TO SCALP FROM RIGHT OR LEFT THIGH POSSIBLE A CELL TO DONOR SITE   SKIN SPLIT GRAFT N/A 06/04/2017   Procedure: SKIN  GRAFT SPLIT THICKNESS TO SCALP FROM RIGHT OR LEFT THIGH POSSIBLE A CELL TO DONOR SITE;  Surgeon: Irene Limbo, MD;  Location: [place];  Service: Plastics;  Laterality: N/A;   SUBMUCOSAL TATTOO INJECTION  05/10/2019   Procedure: SUBMUCOSAL TATTOO INJECTION;  Surgeon: [name], MD;  Location: [facility] ENDOSCOPY;  Service: Endoscopy;;   THYROIDECTOMY     TONSILLECTOMY         Family History  Problem Relation Age of Onset   Lung cancer Father    Lung cancer Mother    Diabetes Brother    Colon cancer Neg Hx    Colon polyps Neg Hx    Esophageal cancer Neg Hx    Stomach cancer Neg Hx    Pancreatic cancer Neg Hx     Social History   Tobacco Use   Smoking status: Former    Packs/day: 2.00    Years: 32.00    Pack years: 64.00    Types: Cigarettes    Quit date: 1983    Years since quitting: 39.8   Smokeless tobacco: Never   Tobacco comments:    No plans to start  Vaping Use   Vaping Use: Never used  Substance Use Topics   Alcohol use:  No   Drug use: No    Home Medications Prior to Admission medications   Medication Sig Start Date End Date Taking? Authorizing Provider  ACETAMINOPHEN EXTRA STRENGTH 500 MG tablet NEW PRESCRIPTION REQUEST: TAKE ONE TABLET BY MOUTH EVERY 6 HOURS AS NEEDED 10/05/20   [name], [name], DO  atorvastatin (LIPITOR) 40 MG tablet NEW PRESCRIPTION REQUEST: TAKE ONE TABLET BY MOUTH EVERY MORNING 10/05/20   [name], [name], DO  cholecalciferol (VITAMIN D) 25 MCG (1000 UNIT) tablet NEW PRESCRIPTION REQUEST: TAKE ONE TABLET BY MOUTH EVERY MORNING 10/05/20   [name], [name], DO  Dapsone 5 % topical gel Apply 1 application topically 2 (two) times a week. 02/08/18   [provider]  ELIQUIS 2.5 MG TABS tablet NEW PRESCRIPTION REQUEST: TAKE ONE TABLET BY MOUTH IN THE MORNING AND IN THE EVENING 10/05/20   [name], [name], DO  Ensure Plus (ENSURE PLUS) LIQD Take 237 mLs by mouth 2 (two) times daily between meals. Choc or Materials engineer, Historical, MD   famotidine (PEPCID) 20 MG tablet NEW PRESCRIPTION REQUEST: TAKE ONE TABLET BY MOUTH IN THE MORNING AND IN THE EVENING 10/05/20   [name], [name], DO  midodrine (PROAMATINE) 2.5 MG tablet TAKE 1 TABLET (2.5 MG TOTAL) BY MOUTH 2 (TWO) TIMES DAILY WITH A MEAL. 10/17/20   [name], Lorrin Goodell, DO  Multiple Vitamin (MULTIVITAMIN) capsule Take 1 capsule by mouth daily.     [provider]  timolol (TIMOPTIC) 0.5 % ophthalmic solution NEW PRESCRIPTION REQUEST: INSTILL ONE DROP IN [facility] EYE IN THE MORNING AND IN THE EVENING 10/05/20   [name], [name], DO    Allergies    Penicillins  Review of Systems   Review of Systems  Constitutional:  Positive for activity change and fatigue. Negative for appetite change, chills, diaphoresis and fever.  HENT: Negative.    Eyes: Negative.   Respiratory: Negative.    Cardiovascular: Negative.   Gastrointestinal: Negative.   Endocrine: Positive for cold intolerance. Negative for heat intolerance.  Genitourinary: Negative.   Neurological:  Positive for weakness and light-headedness. Negative for dizziness, facial asymmetry and headaches.   Physical Exam Updated Vital Signs BP 123/70   Pulse 62   Temp 97.9 F (36.6 C) (Oral)   Resp 16   Ht 5\' 9"  (1.753 m)   Wt 47.2 kg   SpO2 99%   BMI 15.36 kg/m   Physical Exam Vitals and nursing note reviewed. Exam conducted with a chaperone present.  Constitutional:      General: He is not in acute distress.    Appearance: He is underweight. He is not ill-appearing or toxic-appearing.  HENT:     Head: Normocephalic and atraumatic.     Nose: Nose normal. No congestion.     Mouth/Throat:     Mouth: Mucous membranes are moist.     Pharynx: Oropharynx is clear. Uvula midline. No oropharyngeal exudate or posterior oropharyngeal erythema.     Tonsils: No tonsillar exudate.  Eyes:     General: Lids are normal. Vision grossly intact.        Right eye: No discharge.        Left eye: No discharge.     Extraocular  Movements: Extraocular movements intact.     Conjunctiva/sclera: Conjunctivae normal.     Pupils: Pupils are equal, round, and reactive to light.  Neck:     Trachea: Trachea and phonation normal.  Cardiovascular:     Rate and Rhythm: Normal rate and regular rhythm.  Pulses: Normal pulses.     Heart sounds: Normal heart sounds. No murmur heard. Pulmonary:     Effort: Pulmonary effort is normal. No tachypnea, bradypnea, accessory muscle usage, prolonged expiration or respiratory distress.     Breath sounds: Normal breath sounds. No wheezing or rales.  Chest:     Chest wall: No mass, lacerations, deformity, swelling, tenderness, crepitus or edema.  Abdominal:     General: Bowel sounds are normal. There is no distension.     Palpations: Abdomen is soft.     Tenderness: There is no abdominal tenderness. There is no right CVA tenderness, left CVA tenderness, guarding or rebound.  Genitourinary:    Rectum: Normal. No external hemorrhoid or internal hemorrhoid.  Musculoskeletal:        General: No deformity.     Cervical back: Normal range of motion and neck supple. No edema, rigidity, tenderness or crepitus. No pain with movement, spinous process tenderness or muscular tenderness.     Right lower leg: No edema.     Left lower leg: No edema.  Lymphadenopathy:     Cervical: No cervical adenopathy.  Skin:    General: Skin is warm and dry.     Capillary Refill: Capillary refill takes less than 2 seconds.     Coloration: Skin is pale.  Neurological:     General: No focal deficit present.     Mental Status: He is alert and oriented to person, place, and time. Mental status is at baseline.     Gait: Gait is intact. Gait normal.  Psychiatric:        Mood and Affect: Mood normal.    ED Results / Procedures / Treatments   Labs (all labs ordered are listed, but only abnormal results are displayed) Labs Reviewed  BASIC METABOLIC PANEL - Abnormal; Notable for the following components:       Result Value   Glucose, Bld 120 (*)    BUN 30 (*)    Creatinine, Ser 2.12 (*)    GFR, Estimated 31 (*)    All other components within normal limits  CBC - Abnormal; Notable for the following components:   RBC 2.25 (*)    Hemoglobin 6.3 (*)    HCT 21.2 (*)    MCHC 29.7 (*)    All other components within normal limits  POC OCCULT BLOOD, ED - Abnormal; Notable for the following components:   Fecal Occult Bld POSITIVE (*)    All other components within normal limits  PROTIME-INR  URINALYSIS, ROUTINE W REFLEX MICROSCOPIC  CBG MONITORING, ED  TYPE AND SCREEN  PREPARE RBC (CROSSMATCH)    EKG EKG Interpretation  Date/Time:  Monday October 25 2020 10:06:59 EDT Ventricular Rate:  78 PR Interval:  164 QRS Duration: 128 QT Interval:  402 QTC Calculation: 458 R Axis:   -85 Text Interpretation: Normal sinus rhythm with sinus arrhythmia Right bundle branch block Left anterior fascicular block Possible Inferior infarct , age undetermined No significant change since last tracing Confirmed by Blanchie Dessert [phone]) on 10/25/2020 1:06:57 PM  Radiology No results found.  Procedures .Critical Care Performed by: Emeline Darling, PA-C Authorized by: Emeline Darling, PA-C   Critical care provider statement:    Critical care time (minutes):  45   Critical care was necessary to treat or prevent imminent or life-threatening deterioration of the following conditions: symptomatic anemia, Hgb <7.   Critical care was time spent personally by me on the following activities:  Ordering and review  of laboratory studies, ordering and performing treatments and interventions, pulse oximetry, re-evaluation of patient's condition, review of old charts, obtaining history from patient or surrogate, examination of patient and development of treatment plan with patient or surrogate   Medications Ordered in ED Medications  0.9 %  sodium chloride infusion (10 mL/hr Intravenous New Bag/Given  10/25/20 1635)  lactated ringers bolus 500 mL (0 mLs Intravenous Stopped 10/25/20 1411)    ED Course  I have reviewed the triage vital signs and the nursing notes.  Pertinent labs & imaging results that were available during my care of the patient were reviewed by me and considered in my medical decision making (see chart for details).    MDM Rules/Calculators/A&P                         80 year old male presents with concern for symptomatic anemia with outpatient hemoglobin of 6.3.  Chronic symptomatic anemia requiring multiple blood transfusions most recently 3 weeks ago.  Hypotensive on intake, vital signs otherwise normal.  Normotensive at time of my exam.  Cardiopulmonary exam is normal, abdominal exam is benign.  Patient with skin changes to the scalp chronic wound following Mohs surgery without purulent drainage.  Neurovascularly intact in all 4 extremities.  Basic labs were obtained in triage.  CBC with hemoglobin of 6.3, BMP with creatinine mildly elevated from baseline at 2.1 , baseline of 2.  EKG with normal sinus rhythm with sinus arrhythmia without ischemic changes.  Patient with symptomatic anemia.  Feel he will benefit from blood transfusion.  Risks versus benefits were discussed with the patient at the bedside as well as his daughter-in-law.  He expresses to proceed with blood transfusion at this time.  Hemoccult with nonmelanotic appearing stool present on the gloved finger following exam.  Hemoccult positive, however patient has been Hemoccult positive with negative scopes by gastroenterology.  Patient reevaluated and remains feeling well.  He is approximately halfway through his first unit of blood; given this is an ongoing issue without any acute decompensation and normal vital signs at this time do feel patient is a good candidate for discharge home following blood transfusion.  He has outpatient follow-up with nephrology scheduled and will continue to follow with  gastroenterology and his PCP as previously scheduled.  No further work-up warranted in ED at this time.  At time of shift change, care this patient was signed out to oncoming ED provider Benedetto Goad, PA-C.  All pertinent HPI, physical exam, laboratory findings were discussed with her prior to my departure.  Patient will be discharged home following blood transfusion pending normal vital signs throughout his stay and no acute decompensation.  Jyden voiced understanding with medical evaluation and treatment plan.  Each of his questions was answered to his expressed satisfaction.  He is amenable to plan for blood transfusion and likely discharged home.  This chart was dictated using voice recognition software, Dragon. Despite the best efforts of this provider to proofread and correct errors, errors may still occur which can change documentation meaning.   Final Clinical Impression(s) / ED Diagnoses Final diagnoses:  None    Rx / DC Orders ED Discharge Orders     None        Aura Dials 10/25/20 1757    Blanchie Dessert, MD 10/28/20 [address], Gypsy Balsam, PA-C 11/02/20 1338    Blanchie Dessert, MD 11/10/20 1533

## 2020-10-25 NOTE — ED Provider Notes (Signed)
Care assumed from [facility], please see her note for full details, but in brief Keith [name] is a 80 y.o. male with known hx of symptomatic anemia, requiring transfusions, sent from PCP office for Hgb 6.3.  Patient has been undergoing work-up for this anemia has had negative endoscopy and colonoscopy, unclear source for bleeding at this time.  Plan today is for 2 units transfusion.  The rest of patient's work-up has been unremarkable today.  Plan for continued outpatient follow-up after transfusion today, has been referred to nephrology.  Plan: Discharge after 2 unit PRBC transfusion  BP (!) 130/44   Pulse 63   Temp 97.8 F (36.6 C) (Oral)   Resp (!) 24   SpO2 100%    ED Course/Procedures   Labs Reviewed  BASIC METABOLIC PANEL - Abnormal; Notable for the following components:      Result Value   Glucose, Bld 120 (*)    BUN 30 (*)    Creatinine, Ser 2.12 (*)    GFR, Estimated 31 (*)    All other components within normal limits  CBC - Abnormal; Notable for the following components:   RBC 2.25 (*)    Hemoglobin 6.3 (*)    HCT 21.2 (*)    MCHC 29.7 (*)    All other components within normal limits  POC OCCULT BLOOD, ED - Abnormal; Notable for the following components:   Fecal Occult Bld POSITIVE (*)    All other components within normal limits  PROTIME-INR  URINALYSIS, ROUTINE W REFLEX MICROSCOPIC  CBG MONITORING, ED  TYPE AND SCREEN  PREPARE RBC (CROSSMATCH)     Procedures  MDM    Patient tolerated transfusion well, he remains hemodynamically stable.  He is already scheduled for close outpatient follow-up tomorrow and at this time is stable for discharge home.      Keith Larsen, PA-C 10/25/20 2017    Keith Freeze, MD 10/25/20 [phone]

## 2020-10-25 NOTE — ED Triage Notes (Signed)
Hgb 6.3

## 2020-10-25 NOTE — Telephone Encounter (Signed)
Called Trish to check in if patient would be getting his hemoglobin checked, they are currently in the lobby and will be getting the labs done today. I will call them afterwards with results and if we need to schedule a transfusion or not.    Keith Berges, DO

## 2020-10-25 NOTE — ED Provider Notes (Signed)
Emergency Medicine Provider Triage Evaluation Note  Keith [name] , a 80 y.o. male  was evaluated in triage.  Pt complains of low hgb of 6.3 at PCP's office today. Daughter reports he has been dealing with low hgb quiring blood transfusions over the past month or so.  His last transfusion was on 9/22 at [facility].  She reports that he has been near syncopal in the mornings and sometimes lightheaded.  She took him to the PCPs office today who did a hemoglobin at 6.3 and advised to come to the ED for further eval.  He has no complaints.  Patient is on Eliquis.  He has not noticed any dark stools..  Review of Systems  Positive: + low hgb, lightheaded Negative: - melena  Physical Exam  BP (!) 84/52 (BP Location: Left Arm)   Pulse 90   Temp (!) 97.5 F (36.4 C)   Resp 14   SpO2 100%  Gen:   Awake, no distress   Resp:  Normal effort  MSK:   Moves extremities without difficulty  Other:    Medical Decision Making  Medically screening exam initiated at 10:04 AM.  Appropriate orders placed.  Tahji A Tetterton was informed that the remainder of the evaluation will be completed by another provider, this initial triage assessment does not replace that evaluation, and the importance of remaining in the ED until their evaluation is complete.     Eustaquio Maize, PA-C 10/25/20 [place], [place], DO 10/25/20 1252

## 2020-10-25 NOTE — ED Triage Notes (Signed)
Daughter in law stated, Dr. Durene Cal him here due to his hgb  is 6.3 at the Dr's office. Sometimes gets dizzy standing up.

## 2020-10-25 NOTE — Discharge Instructions (Signed)
Follow-up as planned tomorrow.  Return for any new or worsening symptoms.

## 2020-10-26 DIAGNOSIS — I2510 Atherosclerotic heart disease of native coronary artery without angina pectoris: Secondary | ICD-10-CM | POA: Diagnosis not present

## 2020-10-26 DIAGNOSIS — Z466 Encounter for fitting and adjustment of urinary device: Secondary | ICD-10-CM | POA: Diagnosis not present

## 2020-10-26 DIAGNOSIS — D630 Anemia in neoplastic disease: Secondary | ICD-10-CM | POA: Diagnosis not present

## 2020-10-26 DIAGNOSIS — N184 Chronic kidney disease, stage 4 (severe): Secondary | ICD-10-CM | POA: Diagnosis not present

## 2020-10-26 DIAGNOSIS — D631 Anemia in chronic kidney disease: Secondary | ICD-10-CM | POA: Diagnosis not present

## 2020-10-26 DIAGNOSIS — C19 Malignant neoplasm of rectosigmoid junction: Secondary | ICD-10-CM | POA: Diagnosis not present

## 2020-10-26 DIAGNOSIS — H9190 Unspecified hearing loss, unspecified ear: Secondary | ICD-10-CM | POA: Diagnosis not present

## 2020-10-26 DIAGNOSIS — E890 Postprocedural hypothyroidism: Secondary | ICD-10-CM | POA: Diagnosis not present

## 2020-10-26 DIAGNOSIS — I951 Orthostatic hypotension: Secondary | ICD-10-CM | POA: Diagnosis not present

## 2020-10-26 DIAGNOSIS — I480 Paroxysmal atrial fibrillation: Secondary | ICD-10-CM | POA: Diagnosis not present

## 2020-10-26 DIAGNOSIS — D500 Iron deficiency anemia secondary to blood loss (chronic): Secondary | ICD-10-CM | POA: Diagnosis not present

## 2020-10-26 DIAGNOSIS — Z7901 Long term (current) use of anticoagulants: Secondary | ICD-10-CM | POA: Diagnosis not present

## 2020-10-26 DIAGNOSIS — K219 Gastro-esophageal reflux disease without esophagitis: Secondary | ICD-10-CM | POA: Diagnosis not present

## 2020-10-26 DIAGNOSIS — C434 Malignant melanoma of scalp and neck: Secondary | ICD-10-CM | POA: Diagnosis not present

## 2020-10-26 DIAGNOSIS — E559 Vitamin D deficiency, unspecified: Secondary | ICD-10-CM | POA: Diagnosis not present

## 2020-10-26 DIAGNOSIS — E43 Unspecified severe protein-calorie malnutrition: Secondary | ICD-10-CM | POA: Diagnosis not present

## 2020-10-26 DIAGNOSIS — Z87891 Personal history of nicotine dependence: Secondary | ICD-10-CM | POA: Diagnosis not present

## 2020-10-26 DIAGNOSIS — Z9181 History of falling: Secondary | ICD-10-CM | POA: Diagnosis not present

## 2020-10-26 LAB — CBC
Hematocrit: 19.8 % — ABNORMAL LOW (ref 37.5–51.0)
Hemoglobin: 6.3 g/dL — CL (ref 13.0–17.7)
MCH: 28 pg (ref 26.6–33.0)
MCHC: 31.8 g/dL (ref 31.5–35.7)
MCV: 88 fL (ref 79–97)
Platelets: 347 10*3/uL (ref 150–450)
RBC: 2.25 x10E6/uL — CL (ref 4.14–5.80)
RDW: 13.7 % (ref 11.6–15.4)
WBC: 8.4 10*3/uL (ref 3.4–10.8)

## 2020-10-26 LAB — BPAM RBC
Blood Product Expiration Date: 202211032359
Blood Product Expiration Date: 202211032359
ISSUE DATE / TIME: 202210101350
ISSUE DATE / TIME: 202210101620
Unit Type and Rh: 5100
Unit Type and Rh: 5100

## 2020-10-26 LAB — TYPE AND SCREEN
ABO/RH(D): O POS
Antibody Screen: NEGATIVE
Unit division: 0
Unit division: 0

## 2020-10-27 ENCOUNTER — Inpatient Hospital Stay (HOSPITAL_COMMUNITY): Admit: 2020-10-27 | Payer: Medicare Other

## 2020-10-27 ENCOUNTER — Telehealth: Payer: Self-pay

## 2020-10-27 DIAGNOSIS — N184 Chronic kidney disease, stage 4 (severe): Secondary | ICD-10-CM | POA: Diagnosis not present

## 2020-10-27 DIAGNOSIS — C19 Malignant neoplasm of rectosigmoid junction: Secondary | ICD-10-CM | POA: Diagnosis not present

## 2020-10-27 DIAGNOSIS — D630 Anemia in neoplastic disease: Secondary | ICD-10-CM | POA: Diagnosis not present

## 2020-10-27 DIAGNOSIS — E890 Postprocedural hypothyroidism: Secondary | ICD-10-CM | POA: Diagnosis not present

## 2020-10-27 DIAGNOSIS — Z466 Encounter for fitting and adjustment of urinary device: Secondary | ICD-10-CM | POA: Diagnosis not present

## 2020-10-27 DIAGNOSIS — D500 Iron deficiency anemia secondary to blood loss (chronic): Secondary | ICD-10-CM | POA: Diagnosis not present

## 2020-10-27 DIAGNOSIS — C434 Malignant melanoma of scalp and neck: Secondary | ICD-10-CM | POA: Diagnosis not present

## 2020-10-27 DIAGNOSIS — E43 Unspecified severe protein-calorie malnutrition: Secondary | ICD-10-CM | POA: Diagnosis not present

## 2020-10-27 DIAGNOSIS — I951 Orthostatic hypotension: Secondary | ICD-10-CM | POA: Diagnosis not present

## 2020-10-27 DIAGNOSIS — I2510 Atherosclerotic heart disease of native coronary artery without angina pectoris: Secondary | ICD-10-CM | POA: Diagnosis not present

## 2020-10-27 DIAGNOSIS — Z87891 Personal history of nicotine dependence: Secondary | ICD-10-CM | POA: Diagnosis not present

## 2020-10-27 DIAGNOSIS — E559 Vitamin D deficiency, unspecified: Secondary | ICD-10-CM | POA: Diagnosis not present

## 2020-10-27 DIAGNOSIS — D631 Anemia in chronic kidney disease: Secondary | ICD-10-CM | POA: Diagnosis not present

## 2020-10-27 DIAGNOSIS — I480 Paroxysmal atrial fibrillation: Secondary | ICD-10-CM | POA: Diagnosis not present

## 2020-10-27 DIAGNOSIS — Z9181 History of falling: Secondary | ICD-10-CM | POA: Diagnosis not present

## 2020-10-27 DIAGNOSIS — H9190 Unspecified hearing loss, unspecified ear: Secondary | ICD-10-CM | POA: Diagnosis not present

## 2020-10-27 DIAGNOSIS — K219 Gastro-esophageal reflux disease without esophagitis: Secondary | ICD-10-CM | POA: Diagnosis not present

## 2020-10-27 DIAGNOSIS — Z7901 Long term (current) use of anticoagulants: Secondary | ICD-10-CM | POA: Diagnosis not present

## 2020-10-27 NOTE — Telephone Encounter (Signed)
Trish calls nurse line with questions regarding plan after blood transfusion on 10/10. Patient was previously scheduled for blood transfusion on 10/12, however, was canceled since patient had transfusion on 10/10.  [name] has questions regarding follow up plan of care.   Please advise.   Talbot Grumbling, RN

## 2020-10-28 ENCOUNTER — Encounter: Payer: Self-pay | Admitting: Family Medicine

## 2020-10-28 DIAGNOSIS — Z7901 Long term (current) use of anticoagulants: Secondary | ICD-10-CM | POA: Diagnosis not present

## 2020-10-28 DIAGNOSIS — D630 Anemia in neoplastic disease: Secondary | ICD-10-CM | POA: Diagnosis not present

## 2020-10-28 DIAGNOSIS — D500 Iron deficiency anemia secondary to blood loss (chronic): Secondary | ICD-10-CM | POA: Diagnosis not present

## 2020-10-28 DIAGNOSIS — C434 Malignant melanoma of scalp and neck: Secondary | ICD-10-CM | POA: Diagnosis not present

## 2020-10-28 DIAGNOSIS — K219 Gastro-esophageal reflux disease without esophagitis: Secondary | ICD-10-CM | POA: Diagnosis not present

## 2020-10-28 DIAGNOSIS — Z466 Encounter for fitting and adjustment of urinary device: Secondary | ICD-10-CM | POA: Diagnosis not present

## 2020-10-28 DIAGNOSIS — D631 Anemia in chronic kidney disease: Secondary | ICD-10-CM | POA: Diagnosis not present

## 2020-10-28 DIAGNOSIS — H9190 Unspecified hearing loss, unspecified ear: Secondary | ICD-10-CM | POA: Diagnosis not present

## 2020-10-28 DIAGNOSIS — I951 Orthostatic hypotension: Secondary | ICD-10-CM | POA: Diagnosis not present

## 2020-10-28 DIAGNOSIS — E43 Unspecified severe protein-calorie malnutrition: Secondary | ICD-10-CM | POA: Diagnosis not present

## 2020-10-28 DIAGNOSIS — N184 Chronic kidney disease, stage 4 (severe): Secondary | ICD-10-CM | POA: Diagnosis not present

## 2020-10-28 DIAGNOSIS — I2510 Atherosclerotic heart disease of native coronary artery without angina pectoris: Secondary | ICD-10-CM | POA: Diagnosis not present

## 2020-10-28 DIAGNOSIS — I480 Paroxysmal atrial fibrillation: Secondary | ICD-10-CM | POA: Diagnosis not present

## 2020-10-28 DIAGNOSIS — C19 Malignant neoplasm of rectosigmoid junction: Secondary | ICD-10-CM | POA: Diagnosis not present

## 2020-10-28 DIAGNOSIS — E890 Postprocedural hypothyroidism: Secondary | ICD-10-CM | POA: Diagnosis not present

## 2020-10-28 DIAGNOSIS — Z9181 History of falling: Secondary | ICD-10-CM | POA: Diagnosis not present

## 2020-10-28 DIAGNOSIS — Z87891 Personal history of nicotine dependence: Secondary | ICD-10-CM | POA: Diagnosis not present

## 2020-10-28 DIAGNOSIS — E559 Vitamin D deficiency, unspecified: Secondary | ICD-10-CM | POA: Diagnosis not present

## 2020-10-29 ENCOUNTER — Other Ambulatory Visit: Payer: Self-pay | Admitting: Student

## 2020-10-29 DIAGNOSIS — K219 Gastro-esophageal reflux disease without esophagitis: Secondary | ICD-10-CM | POA: Diagnosis not present

## 2020-10-29 DIAGNOSIS — D631 Anemia in chronic kidney disease: Secondary | ICD-10-CM | POA: Diagnosis not present

## 2020-10-29 DIAGNOSIS — D500 Iron deficiency anemia secondary to blood loss (chronic): Secondary | ICD-10-CM | POA: Diagnosis not present

## 2020-10-29 DIAGNOSIS — I480 Paroxysmal atrial fibrillation: Secondary | ICD-10-CM | POA: Diagnosis not present

## 2020-10-29 DIAGNOSIS — N184 Chronic kidney disease, stage 4 (severe): Secondary | ICD-10-CM | POA: Diagnosis not present

## 2020-10-29 DIAGNOSIS — C19 Malignant neoplasm of rectosigmoid junction: Secondary | ICD-10-CM | POA: Diagnosis not present

## 2020-10-29 DIAGNOSIS — I951 Orthostatic hypotension: Secondary | ICD-10-CM | POA: Diagnosis not present

## 2020-10-29 DIAGNOSIS — H9190 Unspecified hearing loss, unspecified ear: Secondary | ICD-10-CM | POA: Diagnosis not present

## 2020-10-29 DIAGNOSIS — I2510 Atherosclerotic heart disease of native coronary artery without angina pectoris: Secondary | ICD-10-CM | POA: Diagnosis not present

## 2020-10-29 DIAGNOSIS — E43 Unspecified severe protein-calorie malnutrition: Secondary | ICD-10-CM | POA: Diagnosis not present

## 2020-10-29 DIAGNOSIS — Z9181 History of falling: Secondary | ICD-10-CM | POA: Diagnosis not present

## 2020-10-29 DIAGNOSIS — E890 Postprocedural hypothyroidism: Secondary | ICD-10-CM | POA: Diagnosis not present

## 2020-10-29 DIAGNOSIS — C434 Malignant melanoma of scalp and neck: Secondary | ICD-10-CM | POA: Diagnosis not present

## 2020-10-29 DIAGNOSIS — E559 Vitamin D deficiency, unspecified: Secondary | ICD-10-CM | POA: Diagnosis not present

## 2020-10-29 DIAGNOSIS — Z87891 Personal history of nicotine dependence: Secondary | ICD-10-CM | POA: Diagnosis not present

## 2020-10-29 DIAGNOSIS — D630 Anemia in neoplastic disease: Secondary | ICD-10-CM | POA: Diagnosis not present

## 2020-10-29 DIAGNOSIS — Z7901 Long term (current) use of anticoagulants: Secondary | ICD-10-CM | POA: Diagnosis not present

## 2020-10-29 DIAGNOSIS — Z466 Encounter for fitting and adjustment of urinary device: Secondary | ICD-10-CM | POA: Diagnosis not present

## 2020-10-29 NOTE — Telephone Encounter (Signed)
Prescription refill request for Eliquis received.  Indication: afib  Last office visit: [place], 05/10/2020 Scr: 2.12 10/25/2020 Age: 80 yo  Weight: 47.2 kg   Refill sent.

## 2020-10-31 ENCOUNTER — Other Ambulatory Visit: Payer: Self-pay | Admitting: Family Medicine

## 2020-11-01 DIAGNOSIS — D631 Anemia in chronic kidney disease: Secondary | ICD-10-CM | POA: Diagnosis not present

## 2020-11-01 DIAGNOSIS — Z8673 Personal history of transient ischemic attack (TIA), and cerebral infarction without residual deficits: Secondary | ICD-10-CM | POA: Diagnosis not present

## 2020-11-01 DIAGNOSIS — N184 Chronic kidney disease, stage 4 (severe): Secondary | ICD-10-CM | POA: Diagnosis not present

## 2020-11-01 DIAGNOSIS — R338 Other retention of urine: Secondary | ICD-10-CM | POA: Diagnosis not present

## 2020-11-02 DIAGNOSIS — E559 Vitamin D deficiency, unspecified: Secondary | ICD-10-CM | POA: Diagnosis not present

## 2020-11-02 DIAGNOSIS — Z466 Encounter for fitting and adjustment of urinary device: Secondary | ICD-10-CM | POA: Diagnosis not present

## 2020-11-02 DIAGNOSIS — D631 Anemia in chronic kidney disease: Secondary | ICD-10-CM | POA: Diagnosis not present

## 2020-11-02 DIAGNOSIS — C434 Malignant melanoma of scalp and neck: Secondary | ICD-10-CM | POA: Diagnosis not present

## 2020-11-02 DIAGNOSIS — Z7901 Long term (current) use of anticoagulants: Secondary | ICD-10-CM | POA: Diagnosis not present

## 2020-11-02 DIAGNOSIS — N184 Chronic kidney disease, stage 4 (severe): Secondary | ICD-10-CM | POA: Diagnosis not present

## 2020-11-02 DIAGNOSIS — D630 Anemia in neoplastic disease: Secondary | ICD-10-CM | POA: Diagnosis not present

## 2020-11-02 DIAGNOSIS — K219 Gastro-esophageal reflux disease without esophagitis: Secondary | ICD-10-CM | POA: Diagnosis not present

## 2020-11-02 DIAGNOSIS — I951 Orthostatic hypotension: Secondary | ICD-10-CM | POA: Diagnosis not present

## 2020-11-02 DIAGNOSIS — Z87891 Personal history of nicotine dependence: Secondary | ICD-10-CM | POA: Diagnosis not present

## 2020-11-02 DIAGNOSIS — C19 Malignant neoplasm of rectosigmoid junction: Secondary | ICD-10-CM | POA: Diagnosis not present

## 2020-11-02 DIAGNOSIS — Z9181 History of falling: Secondary | ICD-10-CM | POA: Diagnosis not present

## 2020-11-02 DIAGNOSIS — H9190 Unspecified hearing loss, unspecified ear: Secondary | ICD-10-CM | POA: Diagnosis not present

## 2020-11-02 DIAGNOSIS — I2510 Atherosclerotic heart disease of native coronary artery without angina pectoris: Secondary | ICD-10-CM | POA: Diagnosis not present

## 2020-11-02 DIAGNOSIS — E43 Unspecified severe protein-calorie malnutrition: Secondary | ICD-10-CM | POA: Diagnosis not present

## 2020-11-02 DIAGNOSIS — D500 Iron deficiency anemia secondary to blood loss (chronic): Secondary | ICD-10-CM | POA: Diagnosis not present

## 2020-11-02 DIAGNOSIS — E890 Postprocedural hypothyroidism: Secondary | ICD-10-CM | POA: Diagnosis not present

## 2020-11-02 DIAGNOSIS — I480 Paroxysmal atrial fibrillation: Secondary | ICD-10-CM | POA: Diagnosis not present

## 2020-11-03 DIAGNOSIS — N184 Chronic kidney disease, stage 4 (severe): Secondary | ICD-10-CM | POA: Diagnosis not present

## 2020-11-03 DIAGNOSIS — D500 Iron deficiency anemia secondary to blood loss (chronic): Secondary | ICD-10-CM | POA: Diagnosis not present

## 2020-11-03 DIAGNOSIS — I2510 Atherosclerotic heart disease of native coronary artery without angina pectoris: Secondary | ICD-10-CM | POA: Diagnosis not present

## 2020-11-03 DIAGNOSIS — Z7901 Long term (current) use of anticoagulants: Secondary | ICD-10-CM | POA: Diagnosis not present

## 2020-11-03 DIAGNOSIS — E43 Unspecified severe protein-calorie malnutrition: Secondary | ICD-10-CM | POA: Diagnosis not present

## 2020-11-03 DIAGNOSIS — H9190 Unspecified hearing loss, unspecified ear: Secondary | ICD-10-CM | POA: Diagnosis not present

## 2020-11-03 DIAGNOSIS — D630 Anemia in neoplastic disease: Secondary | ICD-10-CM | POA: Diagnosis not present

## 2020-11-03 DIAGNOSIS — Z466 Encounter for fitting and adjustment of urinary device: Secondary | ICD-10-CM | POA: Diagnosis not present

## 2020-11-03 DIAGNOSIS — E559 Vitamin D deficiency, unspecified: Secondary | ICD-10-CM | POA: Diagnosis not present

## 2020-11-03 DIAGNOSIS — E890 Postprocedural hypothyroidism: Secondary | ICD-10-CM | POA: Diagnosis not present

## 2020-11-03 DIAGNOSIS — D631 Anemia in chronic kidney disease: Secondary | ICD-10-CM | POA: Diagnosis not present

## 2020-11-03 DIAGNOSIS — I480 Paroxysmal atrial fibrillation: Secondary | ICD-10-CM | POA: Diagnosis not present

## 2020-11-03 DIAGNOSIS — K219 Gastro-esophageal reflux disease without esophagitis: Secondary | ICD-10-CM | POA: Diagnosis not present

## 2020-11-03 DIAGNOSIS — C434 Malignant melanoma of scalp and neck: Secondary | ICD-10-CM | POA: Diagnosis not present

## 2020-11-03 DIAGNOSIS — I951 Orthostatic hypotension: Secondary | ICD-10-CM | POA: Diagnosis not present

## 2020-11-03 DIAGNOSIS — C19 Malignant neoplasm of rectosigmoid junction: Secondary | ICD-10-CM | POA: Diagnosis not present

## 2020-11-03 DIAGNOSIS — Z9181 History of falling: Secondary | ICD-10-CM | POA: Diagnosis not present

## 2020-11-03 DIAGNOSIS — Z87891 Personal history of nicotine dependence: Secondary | ICD-10-CM | POA: Diagnosis not present

## 2020-11-04 ENCOUNTER — Other Ambulatory Visit: Payer: Self-pay

## 2020-11-04 ENCOUNTER — Inpatient Hospital Stay: Payer: Medicare Other | Attending: Oncology

## 2020-11-04 ENCOUNTER — Inpatient Hospital Stay (HOSPITAL_BASED_OUTPATIENT_CLINIC_OR_DEPARTMENT_OTHER): Payer: Medicare Other | Admitting: Oncology

## 2020-11-04 VITALS — BP 103/52 | HR 69 | Temp 98.0°F | Resp 16 | Ht 69.0 in | Wt 104.5 lb

## 2020-11-04 DIAGNOSIS — Z85038 Personal history of other malignant neoplasm of large intestine: Secondary | ICD-10-CM | POA: Diagnosis not present

## 2020-11-04 DIAGNOSIS — E43 Unspecified severe protein-calorie malnutrition: Secondary | ICD-10-CM | POA: Diagnosis not present

## 2020-11-04 DIAGNOSIS — Z79899 Other long term (current) drug therapy: Secondary | ICD-10-CM | POA: Diagnosis not present

## 2020-11-04 DIAGNOSIS — C434 Malignant melanoma of scalp and neck: Secondary | ICD-10-CM | POA: Diagnosis not present

## 2020-11-04 DIAGNOSIS — D630 Anemia in neoplastic disease: Secondary | ICD-10-CM | POA: Diagnosis not present

## 2020-11-04 DIAGNOSIS — D649 Anemia, unspecified: Secondary | ICD-10-CM | POA: Diagnosis not present

## 2020-11-04 DIAGNOSIS — N189 Chronic kidney disease, unspecified: Secondary | ICD-10-CM | POA: Diagnosis not present

## 2020-11-04 DIAGNOSIS — Z9181 History of falling: Secondary | ICD-10-CM | POA: Diagnosis not present

## 2020-11-04 DIAGNOSIS — E890 Postprocedural hypothyroidism: Secondary | ICD-10-CM | POA: Diagnosis not present

## 2020-11-04 DIAGNOSIS — D508 Other iron deficiency anemias: Secondary | ICD-10-CM | POA: Insufficient documentation

## 2020-11-04 DIAGNOSIS — I480 Paroxysmal atrial fibrillation: Secondary | ICD-10-CM | POA: Diagnosis not present

## 2020-11-04 DIAGNOSIS — I951 Orthostatic hypotension: Secondary | ICD-10-CM | POA: Diagnosis not present

## 2020-11-04 DIAGNOSIS — D500 Iron deficiency anemia secondary to blood loss (chronic): Secondary | ICD-10-CM | POA: Diagnosis not present

## 2020-11-04 DIAGNOSIS — Z7901 Long term (current) use of anticoagulants: Secondary | ICD-10-CM | POA: Insufficient documentation

## 2020-11-04 DIAGNOSIS — D631 Anemia in chronic kidney disease: Secondary | ICD-10-CM | POA: Diagnosis not present

## 2020-11-04 DIAGNOSIS — N184 Chronic kidney disease, stage 4 (severe): Secondary | ICD-10-CM | POA: Diagnosis not present

## 2020-11-04 DIAGNOSIS — Z466 Encounter for fitting and adjustment of urinary device: Secondary | ICD-10-CM | POA: Diagnosis not present

## 2020-11-04 DIAGNOSIS — H9190 Unspecified hearing loss, unspecified ear: Secondary | ICD-10-CM | POA: Diagnosis not present

## 2020-11-04 DIAGNOSIS — Z87891 Personal history of nicotine dependence: Secondary | ICD-10-CM | POA: Diagnosis not present

## 2020-11-04 DIAGNOSIS — Z85828 Personal history of other malignant neoplasm of skin: Secondary | ICD-10-CM | POA: Diagnosis not present

## 2020-11-04 DIAGNOSIS — C19 Malignant neoplasm of rectosigmoid junction: Secondary | ICD-10-CM | POA: Diagnosis not present

## 2020-11-04 DIAGNOSIS — I2510 Atherosclerotic heart disease of native coronary artery without angina pectoris: Secondary | ICD-10-CM | POA: Diagnosis not present

## 2020-11-04 DIAGNOSIS — E559 Vitamin D deficiency, unspecified: Secondary | ICD-10-CM | POA: Diagnosis not present

## 2020-11-04 DIAGNOSIS — K219 Gastro-esophageal reflux disease without esophagitis: Secondary | ICD-10-CM | POA: Diagnosis not present

## 2020-11-04 LAB — CMP (CANCER CENTER ONLY)
ALT: 11 U/L (ref 0–44)
AST: 16 U/L (ref 15–41)
Albumin: 3 g/dL — ABNORMAL LOW (ref 3.5–5.0)
Alkaline Phosphatase: 88 U/L (ref 38–126)
Anion gap: 8 (ref 5–15)
BUN: 28 mg/dL — ABNORMAL HIGH (ref 8–23)
CO2: 24 mmol/L (ref 22–32)
Calcium: 9.4 mg/dL (ref 8.9–10.3)
Chloride: 107 mmol/L (ref 98–111)
Creatinine: 2.05 mg/dL — ABNORMAL HIGH (ref 0.61–1.24)
GFR, Estimated: 32 mL/min — ABNORMAL LOW (ref >60)
Glucose, Bld: 109 mg/dL — ABNORMAL HIGH (ref 70–99)
Potassium: 4.5 mmol/L (ref 3.5–5.1)
Sodium: 139 mmol/L (ref 135–145)
Total Bilirubin: <0.2 mg/dL — ABNORMAL LOW (ref 0.3–1.2)
Total Protein: 7.1 g/dL (ref 6.5–8.1)

## 2020-11-04 LAB — CBC WITH DIFFERENTIAL (CANCER CENTER ONLY)
Abs Immature Granulocytes: 0.03 10*3/uL (ref 0.00–0.07)
Basophils Absolute: 0 10*3/uL (ref 0.0–0.1)
Basophils Relative: 1 %
Eosinophils Absolute: 0.3 10*3/uL (ref 0.0–0.5)
Eosinophils Relative: 4 %
HCT: 28.4 % — ABNORMAL LOW (ref 39.0–52.0)
Hemoglobin: 8.8 g/dL — ABNORMAL LOW (ref 13.0–17.0)
Immature Granulocytes: 0 %
Lymphocytes Relative: 18 %
Lymphs Abs: 1.3 10*3/uL (ref 0.7–4.0)
MCH: 28.2 pg (ref 26.0–34.0)
MCHC: 31 g/dL (ref 30.0–36.0)
MCV: 91 fL (ref 80.0–100.0)
Monocytes Absolute: 0.6 10*3/uL (ref 0.1–1.0)
Monocytes Relative: 9 %
Neutro Abs: 5.1 10*3/uL (ref 1.7–7.7)
Neutrophils Relative %: 68 %
Platelet Count: 258 10*3/uL (ref 150–400)
RBC: 3.12 MIL/uL — ABNORMAL LOW (ref 4.22–5.81)
RDW: 14.8 % (ref 11.5–15.5)
WBC Count: 7.4 10*3/uL (ref 4.0–10.5)
nRBC: 0 % (ref 0.0–0.2)

## 2020-11-04 LAB — IRON AND TIBC
Iron: 40 ug/dL — ABNORMAL LOW (ref 42–163)
Saturation Ratios: 11 % — ABNORMAL LOW (ref 20–55)
TIBC: 345 ug/dL (ref 202–409)
UIBC: 306 ug/dL (ref 117–376)

## 2020-11-04 LAB — FERRITIN: Ferritin: 29 ng/mL (ref 24–336)

## 2020-11-04 NOTE — Progress Notes (Signed)
Hematology and Oncology Follow Up   Keith [name] [id number] Jun 28, 1940 80 y.o. 11/04/2020 9:20 AM Keith Oyster, MD      Principle Diagnosis: 80 year old man with:  1.  Scalp melanoma diagnosed in 2019.  He was found to have stage IIc disease.  2.  T3N0 colon cancer arising from the transverse colon confirmed after surgical resection on August 06, 2020.  He had an intact mismatch repair protein analysis.  3.  Multifactorial anemia related to iron deficiency as well as chronic renal insufficiency.   Prior Therapy:    He is status post excision of a scalp lesion and sentinel lymph node biopsy and a selective left neck dissection completed in April 2019.  He is status post robotic assisted right colectomy for transverse colon cancer diagnosed on August 06, 2020.  The final pathology showed invasive adenocarcinoma that is moderately differentiated with 0 out of 18 lymph nodes involved.   Current therapy: Active surveillance.      Interim History: Keith [name] presents today for return evaluation.  Since the last visit, he underwent right colectomy in July 2022.  Postoperatively he did have worsening anemia that required transfusion most recently on October 25, 2020.  He was seen by nephrology on 11/01/2020 and hemoglobin is improved at that time up to 9.4.  Clinically, he reports feeling better and noting slow improvement.  He denies any chest pain, shortness of breath or difficulty breathing.  He denies any dizziness or lightheadedness.  He is eating better and ambulating with the help of a walker although his dependence on it has improved.      Medications: Reviewed without changes. Current Outpatient Medications  Medication Sig Dispense Refill   ACETAMINOPHEN EXTRA STRENGTH 500 MG tablet NEW PRESCRIPTION REQUEST: TAKE ONE TABLET BY MOUTH EVERY 6 HOURS AS NEEDED 360 tablet 3   atorvastatin (LIPITOR) 40 MG tablet NEW PRESCRIPTION REQUEST: TAKE ONE TABLET BY MOUTH  EVERY MORNING 90 tablet 3   cholecalciferol (VITAMIN D) 25 MCG (1000 UNIT) tablet NEW PRESCRIPTION REQUEST: TAKE ONE TABLET BY MOUTH EVERY MORNING 90 tablet 3   Dapsone 5 % topical gel Apply 1 application topically 2 (two) times a week.     ELIQUIS 2.5 MG TABS tablet TAKE 1 TABLET BY MOUTH TWICE A DAY 180 tablet 1   Ensure Plus (ENSURE PLUS) LIQD Take 237 mLs by mouth 2 (two) times daily between meals. Choc or Vanilla     famotidine (PEPCID) 20 MG tablet NEW PRESCRIPTION REQUEST: TAKE ONE TABLET BY MOUTH IN THE MORNING AND IN THE EVENING 180 tablet 3   midodrine (PROAMATINE) 2.5 MG tablet TAKE 1 TABLET (2.5 MG TOTAL) BY MOUTH 2 (TWO) TIMES DAILY WITH A MEAL. 180 tablet 1   Multiple Vitamin (MULTIVITAMIN) capsule Take 1 capsule by mouth daily.      timolol (TIMOPTIC) 0.5 % ophthalmic solution NEW PRESCRIPTION REQUEST: INSTILL ONE DROP IN EACH EYE IN THE MORNING AND IN THE EVENING 45 mL 3   No current facility-administered medications for this visit.     Allergies:  Allergies  Allergen Reactions   Penicillins Other (See Comments)    Has patient had a PCN reaction causing immediate rash, facial/tongue/throat swelling, SOB or lightheadedness with hypotension: Unknown Has patient had a PCN reaction causing severe rash involving mucus membranes or skin necrosis: Unknown Has patient had a PCN reaction that required hospitalization: Unknown Has patient had a PCN reaction occurring within the last 10 years: No If all of the above  answers are "NO", then may proceed with Cephalosporin use. Tolerated cephlasporins   Physical exam:    Blood pressure (!) 103/52, pulse 69, temperature 98 F (36.7 C), temperature source Oral, resp. rate 16, height $RemoveBe'5\' 9"'zLErYNWiQ$  (1.753 m), weight 104 lb 8 oz (47.4 kg), SpO2 100 %.       ECOG 1   General appearance: Comfortable appearing without any discomfort Head: Normocephalic without any trauma Oropharynx: Mucous membranes are moist and pink without any thrush or  ulcers. Eyes: Pupils are equal and round reactive to light. Lymph nodes: No cervical, supraclavicular, inguinal or axillary lymphadenopathy.   Heart:regular rate and rhythm.  S1 and S2 without leg edema. Lung: Clear without any rhonchi or wheezes.  No dullness to percussion. Abdomin: Soft, nontender, nondistended with good bowel sounds.  No hepatosplenomegaly. Musculoskeletal: No joint deformity or effusion.  Full range of motion noted. Neurological: No deficits noted on motor, sensory and deep tendon reflex exam. Skin: No petechial rash or dryness.  Appeared moist.           Lab Results: Lab Results  Component Value Date   WBC 8.8 10/25/2020   HGB 6.3 (LL) 10/25/2020   HCT 21.2 (L) 10/25/2020   MCV 94.2 10/25/2020   PLT 338 10/25/2020     Chemistry      Component Value Date/Time   NA 136 10/25/2020 1006   NA 138 09/02/2020 1234   K 4.7 10/25/2020 1006   CL 105 10/25/2020 1006   CO2 24 10/25/2020 1006   BUN 30 (H) 10/25/2020 1006   BUN 24 09/02/2020 1234   CREATININE 2.12 (H) 10/25/2020 1006   CREATININE 2.61 (H) 06/24/2020 1346      Component Value Date/Time   CALCIUM 9.1 10/25/2020 1006   CALCIUM 7.5 (L) 07/11/2010 0400   ALKPHOS 59 09/23/2020 0426   AST 13 (L) 09/23/2020 0426   AST 13 (L) 06/24/2020 1346   ALT 8 09/23/2020 0426   ALT <6 06/24/2020 1346   BILITOT 1.0 09/23/2020 0426   BILITOT 0.3 06/24/2020 7464       80 year old man with:   1.    Stage IIc melanoma of the scalp diagnosed in March 2019.  He is currently on active surveillance without any evidence of metastatic disease.  No additional treatment will be needed unless documented metastasis is noted.  We will continue active surveillance.    2.  T3N0 transverse colon cancer diagnosed in April 2021.   He is status post surgical resection in July 2022 with a final pathology showed intact mismatch repair protein.  The natural course of this disease was reviewed at this time and treatment  choices were discussed.  Given his age and frail status.  I recommended no adjuvant therapy at this time and continued active surveillance.   3.  Dermatology surveillance: He is currently following with dermatology regularly.  I recommended continuing this.  4.  Anemia: Multifactorial in nature related to iron deficiency, chronic renal failure as well as a GI blood losses from his colon cancer.  His hemoglobin today is 8.8 and does not require transfusion.  Iron studies on October 07, 2020 were reviewed.  Risks and benefits of intravenous iron infusion were discussed at this time.  Complications that include arthralgias, myalgias and infusion related issues were reviewed.  Upon replacing his iron stores will consider growth factor support at that time.   5.  Follow-up: In 3 months for repeat follow-up.  30  minutes were dedicated to this  visit.  Time spent on reviewing laboratory data, disease status update, outlining future plan of care.  Zola Button, MD 11/04/2020 9:20 AM

## 2020-11-08 DIAGNOSIS — Z466 Encounter for fitting and adjustment of urinary device: Secondary | ICD-10-CM | POA: Diagnosis not present

## 2020-11-08 DIAGNOSIS — E43 Unspecified severe protein-calorie malnutrition: Secondary | ICD-10-CM | POA: Diagnosis not present

## 2020-11-08 DIAGNOSIS — N184 Chronic kidney disease, stage 4 (severe): Secondary | ICD-10-CM | POA: Diagnosis not present

## 2020-11-08 DIAGNOSIS — I2510 Atherosclerotic heart disease of native coronary artery without angina pectoris: Secondary | ICD-10-CM | POA: Diagnosis not present

## 2020-11-08 DIAGNOSIS — I480 Paroxysmal atrial fibrillation: Secondary | ICD-10-CM | POA: Diagnosis not present

## 2020-11-08 DIAGNOSIS — E890 Postprocedural hypothyroidism: Secondary | ICD-10-CM | POA: Diagnosis not present

## 2020-11-08 DIAGNOSIS — I951 Orthostatic hypotension: Secondary | ICD-10-CM | POA: Diagnosis not present

## 2020-11-08 DIAGNOSIS — C434 Malignant melanoma of scalp and neck: Secondary | ICD-10-CM | POA: Diagnosis not present

## 2020-11-08 DIAGNOSIS — Z9181 History of falling: Secondary | ICD-10-CM | POA: Diagnosis not present

## 2020-11-08 DIAGNOSIS — K219 Gastro-esophageal reflux disease without esophagitis: Secondary | ICD-10-CM | POA: Diagnosis not present

## 2020-11-08 DIAGNOSIS — D631 Anemia in chronic kidney disease: Secondary | ICD-10-CM | POA: Diagnosis not present

## 2020-11-08 DIAGNOSIS — H9190 Unspecified hearing loss, unspecified ear: Secondary | ICD-10-CM | POA: Diagnosis not present

## 2020-11-08 DIAGNOSIS — D630 Anemia in neoplastic disease: Secondary | ICD-10-CM | POA: Diagnosis not present

## 2020-11-08 DIAGNOSIS — C19 Malignant neoplasm of rectosigmoid junction: Secondary | ICD-10-CM | POA: Diagnosis not present

## 2020-11-08 DIAGNOSIS — Z87891 Personal history of nicotine dependence: Secondary | ICD-10-CM | POA: Diagnosis not present

## 2020-11-08 DIAGNOSIS — E559 Vitamin D deficiency, unspecified: Secondary | ICD-10-CM | POA: Diagnosis not present

## 2020-11-08 DIAGNOSIS — Z7901 Long term (current) use of anticoagulants: Secondary | ICD-10-CM | POA: Diagnosis not present

## 2020-11-08 DIAGNOSIS — D500 Iron deficiency anemia secondary to blood loss (chronic): Secondary | ICD-10-CM | POA: Diagnosis not present

## 2020-11-09 ENCOUNTER — Telehealth: Payer: Self-pay

## 2020-11-09 DIAGNOSIS — D630 Anemia in neoplastic disease: Secondary | ICD-10-CM | POA: Diagnosis not present

## 2020-11-09 DIAGNOSIS — H9190 Unspecified hearing loss, unspecified ear: Secondary | ICD-10-CM | POA: Diagnosis not present

## 2020-11-09 DIAGNOSIS — E43 Unspecified severe protein-calorie malnutrition: Secondary | ICD-10-CM | POA: Diagnosis not present

## 2020-11-09 DIAGNOSIS — C434 Malignant melanoma of scalp and neck: Secondary | ICD-10-CM | POA: Diagnosis not present

## 2020-11-09 DIAGNOSIS — C19 Malignant neoplasm of rectosigmoid junction: Secondary | ICD-10-CM | POA: Diagnosis not present

## 2020-11-09 DIAGNOSIS — E559 Vitamin D deficiency, unspecified: Secondary | ICD-10-CM | POA: Diagnosis not present

## 2020-11-09 DIAGNOSIS — D631 Anemia in chronic kidney disease: Secondary | ICD-10-CM | POA: Diagnosis not present

## 2020-11-09 DIAGNOSIS — I2510 Atherosclerotic heart disease of native coronary artery without angina pectoris: Secondary | ICD-10-CM | POA: Diagnosis not present

## 2020-11-09 DIAGNOSIS — N184 Chronic kidney disease, stage 4 (severe): Secondary | ICD-10-CM | POA: Diagnosis not present

## 2020-11-09 DIAGNOSIS — I951 Orthostatic hypotension: Secondary | ICD-10-CM | POA: Diagnosis not present

## 2020-11-09 DIAGNOSIS — Z87891 Personal history of nicotine dependence: Secondary | ICD-10-CM | POA: Diagnosis not present

## 2020-11-09 DIAGNOSIS — Z9181 History of falling: Secondary | ICD-10-CM | POA: Diagnosis not present

## 2020-11-09 DIAGNOSIS — K219 Gastro-esophageal reflux disease without esophagitis: Secondary | ICD-10-CM | POA: Diagnosis not present

## 2020-11-09 DIAGNOSIS — Z7901 Long term (current) use of anticoagulants: Secondary | ICD-10-CM | POA: Diagnosis not present

## 2020-11-09 DIAGNOSIS — D500 Iron deficiency anemia secondary to blood loss (chronic): Secondary | ICD-10-CM | POA: Diagnosis not present

## 2020-11-09 DIAGNOSIS — I480 Paroxysmal atrial fibrillation: Secondary | ICD-10-CM | POA: Diagnosis not present

## 2020-11-09 DIAGNOSIS — E890 Postprocedural hypothyroidism: Secondary | ICD-10-CM | POA: Diagnosis not present

## 2020-11-09 DIAGNOSIS — Z466 Encounter for fitting and adjustment of urinary device: Secondary | ICD-10-CM | POA: Diagnosis not present

## 2020-11-09 NOTE — Telephone Encounter (Signed)
Jim from [facility] calling for PT verbal orders as follows:  1 time(s) weekly for 4 week(s).  Verbal orders given per [facility] protocol  PT also reports that BP was low at today's visit.  Sitting BP: 102/50 Standing BP: 86/48 Patient remains asymptomatic.   Talbot Grumbling, RN

## 2020-11-10 DIAGNOSIS — L131 Subcorneal pustular dermatitis: Secondary | ICD-10-CM | POA: Diagnosis not present

## 2020-11-15 DIAGNOSIS — Z9181 History of falling: Secondary | ICD-10-CM | POA: Diagnosis not present

## 2020-11-15 DIAGNOSIS — H9190 Unspecified hearing loss, unspecified ear: Secondary | ICD-10-CM | POA: Diagnosis not present

## 2020-11-15 DIAGNOSIS — I951 Orthostatic hypotension: Secondary | ICD-10-CM | POA: Diagnosis not present

## 2020-11-15 DIAGNOSIS — I2510 Atherosclerotic heart disease of native coronary artery without angina pectoris: Secondary | ICD-10-CM | POA: Diagnosis not present

## 2020-11-15 DIAGNOSIS — D631 Anemia in chronic kidney disease: Secondary | ICD-10-CM | POA: Diagnosis not present

## 2020-11-15 DIAGNOSIS — D630 Anemia in neoplastic disease: Secondary | ICD-10-CM | POA: Diagnosis not present

## 2020-11-15 DIAGNOSIS — C19 Malignant neoplasm of rectosigmoid junction: Secondary | ICD-10-CM | POA: Diagnosis not present

## 2020-11-15 DIAGNOSIS — N184 Chronic kidney disease, stage 4 (severe): Secondary | ICD-10-CM | POA: Diagnosis not present

## 2020-11-15 DIAGNOSIS — Z7901 Long term (current) use of anticoagulants: Secondary | ICD-10-CM | POA: Diagnosis not present

## 2020-11-15 DIAGNOSIS — E43 Unspecified severe protein-calorie malnutrition: Secondary | ICD-10-CM | POA: Diagnosis not present

## 2020-11-15 DIAGNOSIS — C434 Malignant melanoma of scalp and neck: Secondary | ICD-10-CM | POA: Diagnosis not present

## 2020-11-15 DIAGNOSIS — D500 Iron deficiency anemia secondary to blood loss (chronic): Secondary | ICD-10-CM | POA: Diagnosis not present

## 2020-11-15 DIAGNOSIS — E559 Vitamin D deficiency, unspecified: Secondary | ICD-10-CM | POA: Diagnosis not present

## 2020-11-15 DIAGNOSIS — Z466 Encounter for fitting and adjustment of urinary device: Secondary | ICD-10-CM | POA: Diagnosis not present

## 2020-11-15 DIAGNOSIS — Z87891 Personal history of nicotine dependence: Secondary | ICD-10-CM | POA: Diagnosis not present

## 2020-11-15 DIAGNOSIS — I480 Paroxysmal atrial fibrillation: Secondary | ICD-10-CM | POA: Diagnosis not present

## 2020-11-15 DIAGNOSIS — E890 Postprocedural hypothyroidism: Secondary | ICD-10-CM | POA: Diagnosis not present

## 2020-11-15 DIAGNOSIS — K219 Gastro-esophageal reflux disease without esophagitis: Secondary | ICD-10-CM | POA: Diagnosis not present

## 2020-11-16 ENCOUNTER — Inpatient Hospital Stay: Payer: Medicare Other | Attending: Oncology

## 2020-11-16 ENCOUNTER — Other Ambulatory Visit: Payer: Self-pay

## 2020-11-16 VITALS — BP 120/51 | HR 58 | Temp 98.3°F | Resp 17

## 2020-11-16 DIAGNOSIS — D631 Anemia in chronic kidney disease: Secondary | ICD-10-CM | POA: Diagnosis not present

## 2020-11-16 DIAGNOSIS — D509 Iron deficiency anemia, unspecified: Secondary | ICD-10-CM | POA: Diagnosis present

## 2020-11-16 DIAGNOSIS — N189 Chronic kidney disease, unspecified: Secondary | ICD-10-CM | POA: Diagnosis not present

## 2020-11-16 DIAGNOSIS — D649 Anemia, unspecified: Secondary | ICD-10-CM

## 2020-11-16 MED ORDER — SODIUM CHLORIDE 0.9 % IV SOLN
200.0000 mg | Freq: Once | INTRAVENOUS | Status: AC
  Administered 2020-11-16: 200 mg via INTRAVENOUS
  Filled 2020-11-16: qty 200

## 2020-11-16 MED ORDER — SODIUM CHLORIDE 0.9 % IV SOLN
Freq: Once | INTRAVENOUS | Status: AC
Start: 2020-11-16 — End: 2020-11-16

## 2020-11-16 NOTE — Patient Instructions (Signed)

## 2020-11-23 ENCOUNTER — Other Ambulatory Visit: Payer: Self-pay

## 2020-11-23 ENCOUNTER — Inpatient Hospital Stay: Payer: Medicare Other

## 2020-11-23 VITALS — BP 115/66 | HR 64 | Temp 98.0°F | Resp 16

## 2020-11-23 DIAGNOSIS — D649 Anemia, unspecified: Secondary | ICD-10-CM

## 2020-11-23 DIAGNOSIS — D509 Iron deficiency anemia, unspecified: Secondary | ICD-10-CM | POA: Diagnosis not present

## 2020-11-23 MED ORDER — SODIUM CHLORIDE 0.9 % IV SOLN
200.0000 mg | Freq: Once | INTRAVENOUS | Status: AC
  Administered 2020-11-23: 200 mg via INTRAVENOUS
  Filled 2020-11-23: qty 200

## 2020-11-23 MED ORDER — SODIUM CHLORIDE 0.9 % IV SOLN
Freq: Once | INTRAVENOUS | Status: AC
Start: 2020-11-23 — End: 2020-11-23

## 2020-11-23 NOTE — Patient Instructions (Signed)
Anemia Anemia is a condition in which there is not enough red blood cells or hemoglobin in the blood. Hemoglobin is a substance in red blood cells that carries oxygen. When you do not have enough red blood cells or hemoglobin (are anemic), your body cannot get enough oxygen and your organs may not work properly. As a result, you may feel very tired or have other problems. What are the causes? Common causes of anemia include: Excessive bleeding. Anemia can be caused by excessive bleeding inside or outside the body, including bleeding from the intestines or from heavy menstrual periods in females. Poor nutrition. Long-lasting (chronic) kidney, thyroid, and liver disease. Bone marrow disorders, spleen problems, and blood disorders. Cancer and treatments for cancer. HIV (human immunodeficiency virus) and AIDS (acquired immunodeficiency syndrome). Infections, medicines, and autoimmune disorders that destroy red blood cells. What are the signs or symptoms? Symptoms of this condition include: Minor weakness. Dizziness. Headache, or difficulties concentrating and sleeping. Heartbeats that feel irregular or faster than normal (palpitations). Shortness of breath, especially with exercise. Pale skin, lips, and nails, or cold hands and feet. Indigestion and nausea. Symptoms may occur suddenly or develop slowly. If your anemia is mild, you may not have symptoms. How is this diagnosed? This condition is diagnosed based on blood tests, your medical history, and a physical exam. In some cases, a test may be needed in which cells are removed from the soft tissue inside of a bone and looked at under a microscope (bone marrow biopsy). Your health care provider may also check your stool (feces) for blood and may do additional testing to look for the cause of your bleeding. Other tests may include: Imaging tests, such as a CT scan or MRI. A procedure to see inside your esophagus and stomach (endoscopy). A  procedure to see inside your colon and rectum (colonoscopy). How is this treated? Treatment for this condition depends on the cause. If you continue to lose a lot of blood, you may need to be treated at a hospital. Treatment may include: Taking supplements of iron, vitamin B12, or folic acid. Taking a hormone medicine (erythropoietin) that can help to stimulate red blood cell growth. Having a blood transfusion. This may be needed if you lose a lot of blood. Making changes to your diet. Having surgery to remove your spleen. Follow these instructions at home: Take over-the-counter and prescription medicines only as told by your health care provider. Take supplements only as told by your health care provider. Follow any diet instructions that you were given by your health care provider. Keep all follow-up visits as told by your health care provider. This is important. Contact a health care provider if: You develop new bleeding anywhere in the body. Get help right away if: You are very weak. You are short of breath. You have pain in your abdomen or chest. You are dizzy or feel faint. You have trouble concentrating. You have bloody stools, black stools, or tarry stools. You vomit repeatedly or you vomit up blood. These symptoms may represent a serious problem that is an emergency. Do not wait to see if the symptoms will go away. Get medical help right away. Call your local emergency services (911 in the [place] Do not drive yourself to the hospital. Summary Anemia is a condition in which you do not have enough red blood cells or enough of a substance in your red blood cells that carries oxygen (hemoglobin). Symptoms may occur suddenly or develop slowly. If your anemia is   mild, you may not have symptoms. °This condition is diagnosed with blood tests, a medical history, and a physical exam. Other tests may be needed. °Treatment for this condition depends on the cause of the anemia. °This  information is not intended to replace advice given to you by your health care provider. Make sure you discuss any questions you have with your health care provider. °Document Revised: 12/10/2018 Document Reviewed: 12/10/2018 °Elsevier Patient Education © 2022 Elsevier Inc[employer] °Iron Sucrose Injection °What is this medication? °IRON SUCROSE (EYE ern SOO krose) treats low levels of iron (iron deficiency anemia) in people with kidney disease. Iron is a mineral that plays an important role in making red blood cells, which carry oxygen from your lungs to the rest of your body. °This medicine may be used for other purposes; ask your health care provider or pharmacist if you have questions. °COMMON BRAND NAME(S): Venofer °What should I tell my care team before I take this medication? °They need to know if you have any of these conditions: °Anemia not caused by low iron levels °Heart disease °High levels of iron in the blood °Kidney disease °Liver disease °An unusual or allergic reaction to iron, other medications, foods, dyes, or preservatives °Pregnant or trying to get pregnant °Breast-feeding °How should I use this medication? °This medication is for infusion into a vein. It is given in a hospital or clinic setting. °Talk to your care team about the use of this medication in children. While this medication may be prescribed for children as young as 2 years for selected conditions, precautions do apply. °Overdosage: If you think you have taken too much of this medicine contact a poison control center or emergency room at once. °NOTE: This medicine is only for you. Do not share this medicine with others. °What if I miss a dose? °It is important not to miss your dose. Call your care team if you are unable to keep an appointment. °What may interact with this medication? °Do not take this medication with any of the following: °Deferoxamine °Dimercaprol °Other iron products °This medication may also interact with the  following: °Chloramphenicol °Deferasirox °This list may not describe all possible interactions. Give your health care provider a list of all the medicines, herbs, non-prescription drugs, or dietary supplements you use. Also tell them if you smoke, drink alcohol, or use illegal drugs. Some items may interact with your medicine. °What should I watch for while using this medication? °Visit your care team regularly. Tell your care team if your symptoms do not start to get better or if they get worse. You may need blood work done while you are taking this medication. °You may need to follow a special diet. Talk to your care team. Foods that contain iron include: whole grains/cereals, dried fruits, beans, or peas, leafy green vegetables, and organ meats (liver, kidney). °What side effects may I notice from receiving this medication? °Side effects that you should report to your care team as soon as possible: °Allergic reactions--skin rash, itching, hives, swelling of the face, lips, tongue, or throat °Low blood pressure--dizziness, feeling faint or lightheaded, blurry vision °Shortness of breath °Side effects that usually do not require medical attention (report to your care team if they continue or are bothersome): °Flushing °Headache °Joint pain °Muscle pain °Nausea °Pain, redness, or irritation at injection site °This list may not describe all possible side effects. Call your doctor for medical advice about side effects. You may report side effects to FDA at 1-800-FDA-1088. °Where   should I keep my medication? °This medication is given in a hospital or clinic and will not be stored at home. °NOTE: This sheet is a summary. It may not cover all possible information. If you have questions about this medicine, talk to your doctor, pharmacist, or health care provider. °© 2022 Elsevier/Gold Standard (2020-05-28 00:00:00) ° °

## 2020-11-30 ENCOUNTER — Inpatient Hospital Stay: Payer: Medicare Other

## 2020-11-30 ENCOUNTER — Other Ambulatory Visit: Payer: Self-pay

## 2020-11-30 VITALS — BP 119/40 | HR 61 | Temp 98.4°F | Resp 16

## 2020-11-30 DIAGNOSIS — D649 Anemia, unspecified: Secondary | ICD-10-CM

## 2020-11-30 DIAGNOSIS — D509 Iron deficiency anemia, unspecified: Secondary | ICD-10-CM | POA: Diagnosis not present

## 2020-11-30 MED ORDER — SODIUM CHLORIDE 0.9 % IV SOLN
200.0000 mg | Freq: Once | INTRAVENOUS | Status: AC
  Administered 2020-11-30: 200 mg via INTRAVENOUS
  Filled 2020-11-30: qty 200

## 2020-11-30 MED ORDER — SODIUM CHLORIDE 0.9 % IV SOLN
Freq: Once | INTRAVENOUS | Status: AC
Start: 2020-11-30 — End: 2020-11-30

## 2020-11-30 NOTE — Progress Notes (Signed)
Pt observed for 30 minutes post iron infusion. No complications, V/S stable.

## 2020-11-30 NOTE — Patient Instructions (Signed)

## 2020-12-07 ENCOUNTER — Inpatient Hospital Stay: Payer: Medicare Other

## 2020-12-07 ENCOUNTER — Other Ambulatory Visit: Payer: Self-pay

## 2020-12-07 VITALS — BP 94/42 | HR 57 | Temp 98.1°F | Resp 18

## 2020-12-07 DIAGNOSIS — D509 Iron deficiency anemia, unspecified: Secondary | ICD-10-CM | POA: Diagnosis not present

## 2020-12-07 DIAGNOSIS — D649 Anemia, unspecified: Secondary | ICD-10-CM

## 2020-12-07 MED ORDER — SODIUM CHLORIDE 0.9 % IV SOLN
200.0000 mg | Freq: Once | INTRAVENOUS | Status: AC
  Administered 2020-12-07: 200 mg via INTRAVENOUS
  Filled 2020-12-07: qty 200

## 2020-12-07 MED ORDER — SODIUM CHLORIDE 0.9 % IV SOLN
Freq: Once | INTRAVENOUS | Status: AC
Start: 2020-12-07 — End: 2020-12-07

## 2020-12-07 NOTE — Progress Notes (Signed)
Patient tolerated IV iron infusion well. Monitored for 30 minutes post infusion without incident. Vitals WDL and ambulatory to lobby upon discharge.

## 2020-12-07 NOTE — Patient Instructions (Signed)

## 2020-12-30 ENCOUNTER — Encounter: Payer: Self-pay | Admitting: Family Medicine

## 2021-01-26 ENCOUNTER — Encounter: Payer: Self-pay | Admitting: Family Medicine

## 2021-02-03 ENCOUNTER — Ambulatory Visit (INDEPENDENT_AMBULATORY_CARE_PROVIDER_SITE_OTHER): Payer: Medicare Other | Admitting: Family Medicine

## 2021-02-03 ENCOUNTER — Encounter: Payer: Self-pay | Admitting: Family Medicine

## 2021-02-03 ENCOUNTER — Other Ambulatory Visit: Payer: Self-pay

## 2021-02-03 VITALS — BP 100/50 | HR 66 | Ht 69.0 in | Wt 109.0 lb

## 2021-02-03 DIAGNOSIS — N184 Chronic kidney disease, stage 4 (severe): Secondary | ICD-10-CM | POA: Diagnosis not present

## 2021-02-03 DIAGNOSIS — Z23 Encounter for immunization: Secondary | ICD-10-CM

## 2021-02-03 DIAGNOSIS — D649 Anemia, unspecified: Secondary | ICD-10-CM

## 2021-02-03 DIAGNOSIS — E43 Unspecified severe protein-calorie malnutrition: Secondary | ICD-10-CM

## 2021-02-03 NOTE — Progress Notes (Signed)
° ° °  SUBJECTIVE:   CHIEF COMPLAINT / HPI:   Persistent fatigue Patient notes that over the last couple weeks he has been having intermittent diarrhea with decreased appetite and increased weakness.  He feels like he is hydrating well but has been feeling a lot weaker.  He notes that after his for iron infusions in November that he felt significantly better.  Daughter-in-law notes that he looks a lot healthier and seem to be doing a Ronda better after these as well.  He has an appoint with Dr. Alen Blew this coming Tuesday and does note that he still prefers to have any infusions to be scheduled for [facility] if it is able to be done.  PERTINENT  PMH / PSH: Reviewed  OBJECTIVE:   BP (!) 100/50    Pulse 66    Ht 5\' 9"  (1.753 m)    Wt 109 lb (49.4 kg)    SpO2 100%    BMI 16.10 kg/m   Gen: Frail appearing elderly gentleman CV: RRR, no m/r/g appreciated, no peripheral edema Pulm: CTAB, no wheezes/crackles GI: soft, non-tender, non-distended  ASSESSMENT/PLAN:   Symptomatic anemia Patient was improved after 4 iron infusions in November 2022.  He has been having progressive worsening of his fatigue in the last few weeks and is concerned it may be related to his anemia.  We will collect further labs today, but concerned that patient would probably benefit from routine scheduled infusions.  Patient does have a preference for [facility] and we would need to coordinate with Dr. Alen Blew, patient reassuringly has an appointment with their office on 1/24 and may be able to facilitate coordinating this if appropriate.  CKD (chronic kidney disease) stage 4, GFR 15-29 ml/min (HCC) We will obtain CMP today.     Rise Patience, [place]

## 2021-02-03 NOTE — Patient Instructions (Signed)
It was so great seeing you today! Today we discussed the following:  - I am getting labs to check on your anemia and iron levels today. I think it will be important to try and get something set up with your cancer physician to see if routine infusions should be considered at [facility].  - Make sure to follow the instructions the speech therapy people recommended based on your swallow study.    Please make sure to bring any medications you take to your appointments. If you have any questions or concerns please call the office at [phone].   If any tests were collected today, I will notify you of results via MyChart, phone call, or letter. Please contact the office if you have not heard back about results within 2 weeks.

## 2021-02-03 NOTE — Assessment & Plan Note (Signed)
Patient was improved after 4 iron infusions in November 2022.  He has been having progressive worsening of his fatigue in the last few weeks and is concerned it may be related to his anemia.  We will collect further labs today, but concerned that patient would probably benefit from routine scheduled infusions.  Patient does have a preference for [name] and we would need to coordinate with Dr. Alen Blew, patient reassuringly has an appointment with their office on 1/24 and may be able to facilitate coordinating this if appropriate.

## 2021-02-03 NOTE — Assessment & Plan Note (Signed)
We will obtain CMP today.

## 2021-02-04 LAB — COMPREHENSIVE METABOLIC PANEL
ALT: 7 IU/L (ref 0–44)
AST: 12 IU/L (ref 0–40)
Albumin/Globulin Ratio: 1.2 (ref 1.2–2.2)
Albumin: 3.8 g/dL (ref 3.7–4.7)
Alkaline Phosphatase: 117 IU/L (ref 44–121)
BUN/Creatinine Ratio: 10 (ref 10–24)
BUN: 22 mg/dL (ref 8–27)
Bilirubin Total: 0.2 mg/dL (ref 0.0–1.2)
CO2: 22 mmol/L (ref 20–29)
Calcium: 9.3 mg/dL (ref 8.6–10.2)
Chloride: 102 mmol/L (ref 96–106)
Creatinine, Ser: 2.15 mg/dL — ABNORMAL HIGH (ref 0.76–1.27)
Globulin, Total: 3.3 g/dL (ref 1.5–4.5)
Glucose: 87 mg/dL (ref 70–99)
Potassium: 4.5 mmol/L (ref 3.5–5.2)
Sodium: 137 mmol/L (ref 134–144)
Total Protein: 7.1 g/dL (ref 6.0–8.5)
eGFR: 31 mL/min/{1.73_m2} — ABNORMAL LOW (ref >59)

## 2021-02-04 LAB — IRON,TIBC AND FERRITIN PANEL
Ferritin: 195 ng/mL (ref 30–400)
Iron Saturation: 15 % (ref 15–55)
Iron: 32 ug/dL — ABNORMAL LOW (ref 38–169)
Total Iron Binding Capacity: 215 ug/dL — ABNORMAL LOW (ref 250–450)
UIBC: 183 ug/dL (ref 111–343)

## 2021-02-04 LAB — CBC
Hematocrit: 33.5 % — ABNORMAL LOW (ref 37.5–51.0)
Hemoglobin: 11.4 g/dL — ABNORMAL LOW (ref 13.0–17.7)
MCH: 28.4 pg (ref 26.6–33.0)
MCHC: 34 g/dL (ref 31.5–35.7)
MCV: 83 fL (ref 79–97)
Platelets: 325 10*3/uL (ref 150–450)
RBC: 4.02 x10E6/uL — ABNORMAL LOW (ref 4.14–5.80)
RDW: 14 % (ref 11.6–15.4)
WBC: 6.1 10*3/uL (ref 3.4–10.8)

## 2021-02-08 ENCOUNTER — Inpatient Hospital Stay: Payer: Medicare Other

## 2021-02-08 ENCOUNTER — Other Ambulatory Visit: Payer: Self-pay

## 2021-02-08 ENCOUNTER — Inpatient Hospital Stay: Payer: Medicare Other | Attending: Oncology | Admitting: Oncology

## 2021-02-08 VITALS — BP 111/58 | HR 67 | Temp 97.9°F | Resp 15 | Ht 69.0 in | Wt 110.1 lb

## 2021-02-08 DIAGNOSIS — D631 Anemia in chronic kidney disease: Secondary | ICD-10-CM | POA: Diagnosis not present

## 2021-02-08 DIAGNOSIS — D509 Iron deficiency anemia, unspecified: Secondary | ICD-10-CM | POA: Diagnosis not present

## 2021-02-08 DIAGNOSIS — C184 Malignant neoplasm of transverse colon: Secondary | ICD-10-CM | POA: Diagnosis not present

## 2021-02-08 DIAGNOSIS — C434 Malignant melanoma of scalp and neck: Secondary | ICD-10-CM | POA: Insufficient documentation

## 2021-02-08 DIAGNOSIS — N189 Chronic kidney disease, unspecified: Secondary | ICD-10-CM | POA: Diagnosis not present

## 2021-02-08 DIAGNOSIS — C189 Malignant neoplasm of colon, unspecified: Secondary | ICD-10-CM | POA: Diagnosis not present

## 2021-02-08 DIAGNOSIS — D649 Anemia, unspecified: Secondary | ICD-10-CM

## 2021-02-08 NOTE — Progress Notes (Signed)
Hematology and Oncology Follow Up   Keith [name] [id number] 1940-07-11 81 y.o. 02/08/2021 3:12 PM Keith Ser, DO      Principle Diagnosis: 81 year old man with:  1.  Stage IIc scalp melanoma diagnosed in 2019.    2.  T3N0 colon cancer arising from the transverse colon confirmed after surgical resection on August 06, 2020.  He had an intact mismatch repair protein analysis.  3.  Anemia related to chronic blood losses, iron deficiency and chronic renal failure.   Prior Therapy:    He is status post excision of a scalp lesion and sentinel lymph node biopsy and a selective left neck dissection completed in April 2019.  He is status post robotic assisted right colectomy for transverse colon cancer diagnosed on August 06, 2020.  The final pathology showed invasive adenocarcinoma that is moderately differentiated with 0 out of 18 lymph nodes involved.   Current therapy: Active surveillance.      Interim History: Keith [name] returns today for a follow-up visit.  He reports no major changes in his health.  He has reported some mild fatigue and tiredness but no hematochezia or melena.  He denies any hemoptysis or hematemesis.  His performance status quality of life remains at baseline.  He denies any worsening scalp lesions.      Medications: Updated on review. Current Outpatient Medications  Medication Sig Dispense Refill   ACETAMINOPHEN EXTRA STRENGTH 500 MG tablet NEW PRESCRIPTION REQUEST: TAKE ONE TABLET BY MOUTH EVERY 6 HOURS AS NEEDED 360 tablet 3   atorvastatin (LIPITOR) 40 MG tablet NEW PRESCRIPTION REQUEST: TAKE ONE TABLET BY MOUTH EVERY MORNING 90 tablet 3   cholecalciferol (VITAMIN D) 25 MCG (1000 UNIT) tablet NEW PRESCRIPTION REQUEST: TAKE ONE TABLET BY MOUTH EVERY MORNING 90 tablet 3   Dapsone 5 % topical gel Apply 1 application topically 2 (two) times a week.     ELIQUIS 2.5 MG TABS tablet TAKE 1 TABLET BY MOUTH TWICE A DAY 180 tablet 1   Ensure Plus  (ENSURE PLUS) LIQD Take 237 mLs by mouth 2 (two) times daily between meals. Choc or Vanilla     famotidine (PEPCID) 20 MG tablet NEW PRESCRIPTION REQUEST: TAKE ONE TABLET BY MOUTH IN THE MORNING AND IN THE EVENING 180 tablet 3   midodrine (PROAMATINE) 2.5 MG tablet TAKE 1 TABLET (2.5 MG TOTAL) BY MOUTH 2 (TWO) TIMES DAILY WITH A MEAL. 180 tablet 1   Multiple Vitamin (MULTIVITAMIN) capsule Take 1 capsule by mouth daily.      timolol (TIMOPTIC) 0.5 % ophthalmic solution NEW PRESCRIPTION REQUEST: INSTILL ONE DROP IN EACH EYE IN THE MORNING AND IN THE EVENING 45 mL 3   No current facility-administered medications for this visit.     Allergies:  Allergies  Allergen Reactions   Penicillins Other (See Comments)    Has patient had a PCN reaction causing immediate rash, facial/tongue/throat swelling, SOB or lightheadedness with hypotension: Unknown Has patient had a PCN reaction causing severe rash involving mucus membranes or skin necrosis: Unknown Has patient had a PCN reaction that required hospitalization: Unknown Has patient had a PCN reaction occurring within the last 10 years: No If all of the above answers are "NO", then may proceed with Cephalosporin use. Tolerated cephlasporins   Physical exam:       Blood pressure (!) 111/58, pulse 67, temperature 97.9 F (36.6 C), temperature source Temporal, resp. rate 15, height 5' 9"  (1.753 m), weight 110 lb 1.6 oz (49.9 kg), SpO2 100 %.  ECOG 1   General appearance: Alert, awake without any distress. Head: Atraumatic without abnormalities Oropharynx: Without any thrush or ulcers. Eyes: No scleral icterus. Lymph nodes: No lymphadenopathy noted in the cervical, supraclavicular, or axillary nodes Heart:regular rate and rhythm, without any murmurs or gallops.   Lung: Clear to auscultation without any rhonchi, wheezes or dullness to percussion. Abdomin: Soft, nontender without any shifting dullness or ascites. Musculoskeletal: No  clubbing or cyanosis. Neurological: No motor or sensory deficits. Skin: No rashes or lesions.           Lab Results: Lab Results  Component Value Date   WBC 6.1 02/03/2021   HGB 11.4 (L) 02/03/2021   HCT 33.5 (L) 02/03/2021   MCV 83 02/03/2021   PLT 325 02/03/2021     Chemistry      Component Value Date/Time   NA 137 02/03/2021 1601   K 4.5 02/03/2021 1601   CL 102 02/03/2021 1601   CO2 22 02/03/2021 1601   BUN 22 02/03/2021 1601   CREATININE 2.15 (H) 02/03/2021 1601   CREATININE 2.05 (H) 11/04/2020 0916      Component Value Date/Time   CALCIUM 9.3 02/03/2021 1601   CALCIUM 7.5 (L) 07/11/2010 0400   ALKPHOS 117 02/03/2021 1601   AST 12 02/03/2021 1601   AST 16 11/04/2020 0916   ALT 7 02/03/2021 1601   ALT 11 11/04/2020 0916   BILITOT 0.2 02/03/2021 1601   BILITOT <0.2 (L) 11/04/2020 8931       81 year old man with:   1.    Melanoma of the scalp diagnosed in 2019.  He was found to have stage IIc without any evidence of relapse.  The natural course of this disease was reviewed at this time and treatment choices were discussed.  No additional treatment is needed unless he has recurrent disease.    2.  Colon cancer diagnosed in April 2021.  He underwent surgical resection in 2022 and found to have T3N0 disease.  He is currently on active surveillance and not a candidate for any additional therapy.  Additional treatment could be considered if he has metastatic disease detected.  I recommended repeat imaging studies in the future to evaluate for any recurrent disease.   3.  Anemia: Related to iron deficiency and chronic blood losses.  His hemoglobin is up to 11 in January 2023.   5.  Follow-up: In 5 months for repeat evaluation.  30  minutes were dedicated to this visit.  Time spent on reviewing laboratory data, disease status update, outlining future plan of care.  Zola Button, MD 02/08/2021 3:12 PM

## 2021-02-15 DIAGNOSIS — R338 Other retention of urine: Secondary | ICD-10-CM | POA: Diagnosis not present

## 2021-02-28 DIAGNOSIS — N184 Chronic kidney disease, stage 4 (severe): Secondary | ICD-10-CM | POA: Diagnosis not present

## 2021-03-09 DIAGNOSIS — Z8673 Personal history of transient ischemic attack (TIA), and cerebral infarction without residual deficits: Secondary | ICD-10-CM | POA: Diagnosis not present

## 2021-03-09 DIAGNOSIS — D631 Anemia in chronic kidney disease: Secondary | ICD-10-CM | POA: Diagnosis not present

## 2021-03-09 DIAGNOSIS — N184 Chronic kidney disease, stage 4 (severe): Secondary | ICD-10-CM | POA: Diagnosis not present

## 2021-03-16 DIAGNOSIS — R338 Other retention of urine: Secondary | ICD-10-CM | POA: Diagnosis not present

## 2021-03-21 DIAGNOSIS — Z85820 Personal history of malignant melanoma of skin: Secondary | ICD-10-CM | POA: Diagnosis not present

## 2021-03-24 ENCOUNTER — Encounter: Payer: Self-pay | Admitting: Oncology

## 2021-03-31 ENCOUNTER — Other Ambulatory Visit: Payer: Medicare Other | Admitting: Internal Medicine

## 2021-03-31 ENCOUNTER — Other Ambulatory Visit: Payer: Self-pay

## 2021-03-31 DIAGNOSIS — D500 Iron deficiency anemia secondary to blood loss (chronic): Secondary | ICD-10-CM

## 2021-03-31 DIAGNOSIS — C434 Malignant melanoma of scalp and neck: Secondary | ICD-10-CM | POA: Diagnosis not present

## 2021-03-31 DIAGNOSIS — Z515 Encounter for palliative care: Secondary | ICD-10-CM

## 2021-03-31 DIAGNOSIS — R54 Age-related physical debility: Secondary | ICD-10-CM

## 2021-03-31 DIAGNOSIS — N184 Chronic kidney disease, stage 4 (severe): Secondary | ICD-10-CM | POA: Diagnosis not present

## 2021-03-31 DIAGNOSIS — C184 Malignant neoplasm of transverse colon: Secondary | ICD-10-CM | POA: Diagnosis not present

## 2021-03-31 DIAGNOSIS — F32A Depression, unspecified: Secondary | ICD-10-CM

## 2021-03-31 DIAGNOSIS — Z8673 Personal history of transient ischemic attack (TIA), and cerebral infarction without residual deficits: Secondary | ICD-10-CM

## 2021-03-31 NOTE — Progress Notes (Signed)
[facility] [facility] Palliative Care Follow-Up Visit Telephone: [phone]  Fax: [phone]   Date of encounter: 03/31/21 10:16 AM PATIENT NAME: Keith [name] [address] [place] [address]   [phone] (home) [phone] (work) DOB: 1940-07-03 MRN: [id number] PRIMARY CARE PROVIDER:    Rise Patience, DO,  [place] [address] [phone]  REFERRING PROVIDER:   Talbert Cage, MD  RESPONSIBLE PARTY:    Contact Information     Name Relation Home Work Keith [name] [phone]  [phone]   Keith [name] [phone]  [phone]        I met face to face with patient and family in his home. Palliative Care was asked to follow this patient by consultation request of Keith [name] to address advance care planning and complex medical decision making. This is follow-up visit.                                     ASSESSMENT AND PLAN / RECOMMENDATIONS:   Advance Care Planning/Goals of Care: Goals include to maximize quality of life and symptom management. Patient/health care surrogate gave his/her permission to discuss.Our advance care planning conversation included a discussion about:    The value and importance of advance care planning  Experiences with loved ones who have been seriously ill or have died  Exploration of personal, cultural or spiritual beliefs that might influence medical decisions  Exploration of goals of care in the event of a sudden injury or illness  Identification  of a healthcare agent  Review and updating or creation of an  advance directive document . Decision not to resuscitate or to de-escalate disease focused treatments due to poor prognosis. 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.4):  s/p resection, continues to have mild  anemia, but overall doing much better, functioning well at home--able to cook, do some other chores, and looks forward to walking more outside in warmer weather  Iron deficiency anemia (D50.9):  remains with last hgb 2/13 of 10.2 which is much better than he'd been running--no longer requiring transfusions and iron infusions regularly after cancer removed  CKD IV (N18.4):  continue to avoid nephrotoxic meds like nsaids (use tylenol for pain or topical agents), hydrate well with water (discussed extensively today), and monitor output carefully--still has foley which bothers him, but there were some issues with his urology follow-up  History of stroke (Z86.73):  doing much better, continue secondary preventive strategies with bp, lipid control, staying active mentally and physically  Frailty (R54):  remains thin, but mobility is much improved, continue to do therapy exercises provided (emphasized today), and do things on his own as able (granddaughter helping as she lives with him)  Depression and anxiety (F41.8):  has good and bad days, writes poetry as an Development worker, community, watches tv, reads, likes to look at recipes, reminisce  Melanoma of scalp (C43.4): wound on scalp remains and he follows regularly with plastic surgery and derm to monitor, but it's not expected to ever heal fully due to thin skin on that area; not known to have any residual malignancy though   Follow up Palliative Care Visit: Palliative care will continue to follow for complex medical decision making, advance care planning, and clarification of goals. Return  07/05/2021 and PRN.  This visit was coded based on medical decision making (MDM).  PPS: 60%  HOSPICE ELIGIBILITY/DIAGNOSIS: Not presently/colon cancer/melanoma   Chief Complaint: Follow-up palliative visit  HISTORY OF PRESENT ILLNESS:  Keith [name] is a 81 y.o. year old male  with melanoma of his scalp (resected and clear margins), colon ca s/p right colectomy in 07/2020 on  active surveillance thru Keith [name] but not a candidate for further tx (repeat imaging recommended though), stroke, iron deficiency anemia, CKD IV, depression and anxiety seen for palliative care follow up.    He was seen at [facility] on 1/19 with concerns for diarrhea and fatigue.  His h/h and cmp were checked. Hgb 11.  He's to f/u with oncology next in 3 more months from now.  Wt 113 lb on 03/09/21 with bmi 16.69 which was an 8 lb weight gain in 4 mos.  He saw nephrology and cr 2.32 and BUN 26 on 02/29/20, hgb 10.2 then History obtained from review of EMR, discussion with primary team, and interview with family, facility staff/caregiver and/or Keith [name].  I reviewed available labs, medications, imaging, studies and related documents from the EMR.  Records reviewed and summarized above.   He saw plastic surgery again re his scalp wounds/prior mohs--grafting not recommended----cont to monitor and keeps f/u with derm, Keith [name] in June and oncology.  When seen, he shared that he is more mobile now, able to do his therapy exercises, take short walks, do some cooking--he'd just cooked some homemade chicken fingers that looked like he'd breaded them himself and smelled delicious.  He continues to have good and bad days with his mood--will write poetry and reminisce to help.  Usually can work himself out of a bad mood.  He denies pain, feels pretty good overall.    ROS  General: NAD EYES: denies vision changes, uses glasses ENMT: denies dysphagia Cardiovascular: denies chest pain, denies DOE Pulmonary: denies cough, denies increased SOB Abdomen: endorses good appetite--much improved from what it was--his granddaughter still wishes he'd eat more, but he has put on weight, denies constipation, endorses continence of bowel GU: foley remains for retention and he wishes he could be rid of it--on meds to help shrink prostate MSK:  denies increased weakness,  no falls reported Skin: denies  rashes or wounds Neurological: denies pain, denies insomnia Psych: Endorses positive mood much of the time, but has some bad days where he feels sad and blue Heme/lymph/immuno: denies bruises, abnormal bleeding  Physical Exam: Current and past weights: 110 lbs with bmi 16 on 02/08/21 up from 98 lbs in 09/2020 Constitutional: NAD General: frail appearing, thin, pale EYES: anicteric sclera, lids intact, no discharge  ENMT: intact [name], oral mucous membranes moist CV: S1S2, RRR, no LE edema Pulmonary: LCTA, no increased work of breathing, no cough, room air Abdomen: intake 75%, normo-active BS + 4 quadrants, soft and non tender, no ascites GU: deferred MSK:  sarcopenia, moves all extremities, ambulatory w/o assistive device Skin: warm and dry, dressing over wound on scalp, wears a hat Neuro:  generalized weakness,  no cognitive impairment Psych: non-anxious affect, A and O x 3 Hem/lymph/immuno: no widespread bruising  CURRENT PROBLEM LIST:  Patient Active Problem List   Diagnosis Date Noted   Anemia 11/04/2020   Benign esophageal stricture    Acute blood loss anemia 09/22/2020   Difficulty with speech    Orthostatic hypotension 09/05/2020   Internal hernia 08/13/2020   Encounter for assessment for small  bowel obstruciton 08/13/2020   Protein-calorie malnutrition, severe 08/11/2020   Cancer of transverse colon ([place]) 08/06/2020   Coronary artery calcification 05/25/2020   Adenocarcinoma ([place]) 05/22/2019   Mass of colon    Symptomatic anemia 05/08/2019   Gastroesophageal reflux disease 02/18/2019   Iron deficiency 09/26/2018   Anemia in stage 4 chronic kidney disease ([place]) 05/21/2018   Vitamin D deficiency 05/21/2018   History of CVA (cerebrovascular accident) 01/31/2018   TSH elevation 01/28/2018   Underweight 01/24/2018   CKD (chronic kidney disease) stage 4, GFR 15-29 ml/min (HCC) 12/10/2017   Hypotension 12/10/2017   Paroxysmal atrial fibrillation ([place]) 11/07/2017    Bilateral carotid artery stenosis 09/03/2017   Malignant melanoma of skin of scalp ([employer]) 03/26/2017   Hyperlipidemia 02/08/2017   SAH (subarachnoid hemorrhage) ([place]) 11/20/2016   Cerebrovascular accident (CVA) due to occlusion of left carotid artery ([place]) 11/20/2016   PAST MEDICAL HISTORY:  Active Ambulatory Problems    Diagnosis Date Noted   SAH (subarachnoid hemorrhage) ([place]) 11/20/2016   Cerebrovascular accident (CVA) due to occlusion of left carotid artery ([place]) 11/20/2016   Hyperlipidemia 02/08/2017   Malignant melanoma of skin of scalp ([place]) 03/26/2017   Bilateral carotid artery stenosis 09/03/2017   Paroxysmal atrial fibrillation ([place]) 11/07/2017   CKD (chronic kidney disease) stage 4, GFR 15-29 ml/min (HCC) 12/10/2017   Hypotension 12/10/2017   Underweight 01/24/2018   TSH elevation 01/28/2018   Gastroesophageal reflux disease 02/18/2019   Anemia in stage 4 chronic kidney disease ([place]) 05/21/2018   History of CVA (cerebrovascular accident) 01/31/2018   Iron deficiency 09/26/2018   Vitamin D deficiency 05/21/2018   Symptomatic anemia 05/08/2019   Mass of colon    Adenocarcinoma ([place]) 05/22/2019   Coronary artery calcification 05/25/2020   Cancer of transverse colon ([place]) 08/06/2020   Protein-calorie malnutrition, severe 08/11/2020   Internal hernia 08/13/2020   Encounter for assessment for small bowel obstruciton 08/13/2020   Orthostatic hypotension 09/05/2020   Difficulty with speech    Acute blood loss anemia 09/22/2020   Benign esophageal stricture    Anemia 11/04/2020   Resolved Ambulatory Problems    Diagnosis Date Noted   Neoplasm of skin of scalp 11/20/2016   Gait disturbance    Subarachnoid hemorrhage (HCC)    Dysphagia, post-stroke    CKD (chronic kidney disease) stage 3, GFR 30-59 ml/min (HCC)    Hyperglycemia    Acute blood loss anemia    Hemiparesis affecting right side as late effect of cerebrovascular accident (CVA) ([place]) 11/23/2016   Gait  disturbance, post-stroke 11/23/2016   Left middle cerebral artery stroke ([place]) 11/23/2016   Cataract of both eyes 02/08/2017   Carotid occlusion, left 02/08/2017   Carotid stenosis, asymptomatic, right 46/65/9935   Dentures complicating chewing 70/17/7939   Acute left ankle pain 06/27/2018   Past Medical History:  Diagnosis Date   Cataract    Chronic kidney disease    Complication of anesthesia    Dysrhythmia    GERD (gastroesophageal reflux disease)    Glaucoma    History of hiatal hernia    Hypothyroidism    Hypothyroidism, postsurgical 11/20/2016   Melanoma ([place])    Melanoma of scalp ([place]) 04/23/2017   Obstructive uropathy    PONV (postoperative nausea and vomiting) 03/20/2017   Stroke ([place]) 11/2016   SOCIAL HX:  Social History   Tobacco Use   Smoking status: Former    Packs/day: 2.00    Years: 32.00    Pack years: 64.00    Types:  Cigarettes    Quit date: 80    Years since quitting: 40.2   Smokeless tobacco: Never   Tobacco comments:    No plans to start  Substance Use Topics   Alcohol use: No     ALLERGIES:  Allergies  Allergen Reactions   Penicillins Other (See Comments)    Has patient had a PCN reaction causing immediate rash, facial/tongue/throat swelling, SOB or lightheadedness with hypotension: Unknown Has patient had a PCN reaction causing severe rash involving mucus membranes or skin necrosis: Unknown Has patient had a PCN reaction that required hospitalization: Unknown Has patient had a PCN reaction occurring within the last 10 years: No If all of the above answers are "NO", then may proceed with Cephalosporin use. Tolerated cephlasporins     PERTINENT MEDICATIONS:  Outpatient Encounter Medications as of 03/31/2021  Medication Sig   ACETAMINOPHEN EXTRA STRENGTH 500 MG tablet NEW PRESCRIPTION REQUEST: TAKE ONE TABLET BY MOUTH EVERY 6 HOURS AS NEEDED   atorvastatin (LIPITOR) 40 MG tablet NEW PRESCRIPTION REQUEST: TAKE ONE TABLET BY MOUTH EVERY  MORNING   cholecalciferol (VITAMIN D) 25 MCG (1000 UNIT) tablet NEW PRESCRIPTION REQUEST: TAKE ONE TABLET BY MOUTH EVERY MORNING   Dapsone 5 % topical gel Apply 1 application topically 2 (two) times a week.   ELIQUIS 2.5 MG TABS tablet TAKE 1 TABLET BY MOUTH TWICE A DAY   Ensure Plus (ENSURE PLUS) LIQD Take 237 mLs by mouth 2 (two) times daily between meals. Choc or Vanilla   famotidine (PEPCID) 20 MG tablet NEW PRESCRIPTION REQUEST: TAKE ONE TABLET BY MOUTH IN THE MORNING AND IN THE EVENING   midodrine (PROAMATINE) 2.5 MG tablet TAKE 1 TABLET (2.5 MG TOTAL) BY MOUTH 2 (TWO) TIMES DAILY WITH A MEAL.   Multiple Vitamin (MULTIVITAMIN) capsule Take 1 capsule by mouth daily.    timolol (TIMOPTIC) 0.5 % ophthalmic solution NEW PRESCRIPTION REQUEST: INSTILL ONE DROP IN [facility] EYE IN THE MORNING AND IN THE EVENING   No facility-administered encounter medications on file as of 03/31/2021.   Thank you for the opportunity to participate in the care of Mr. Spink.  The palliative care team will continue to follow. Please call our office at [phone] if we can be of additional assistance.   Hollace Kinnier, DO  COVID-19 PATIENT SCREENING TOOL Asked and negative response unless otherwise noted:  Have you had symptoms of covid, tested positive or been in contact with someone with symptoms/positive test in the past 5-10 days? no

## 2021-04-17 ENCOUNTER — Encounter: Payer: Self-pay | Admitting: Internal Medicine

## 2021-04-21 DIAGNOSIS — R338 Other retention of urine: Secondary | ICD-10-CM | POA: Diagnosis not present

## 2021-05-09 DIAGNOSIS — R338 Other retention of urine: Secondary | ICD-10-CM | POA: Diagnosis not present

## 2021-05-23 ENCOUNTER — Encounter: Payer: Self-pay | Admitting: Family Medicine

## 2021-05-23 ENCOUNTER — Ambulatory Visit (INDEPENDENT_AMBULATORY_CARE_PROVIDER_SITE_OTHER): Payer: Medicare Other | Admitting: Family Medicine

## 2021-05-23 VITALS — BP 130/52 | HR 56 | Ht 69.0 in | Wt 111.2 lb

## 2021-05-23 DIAGNOSIS — C434 Malignant melanoma of scalp and neck: Secondary | ICD-10-CM

## 2021-05-23 DIAGNOSIS — N184 Chronic kidney disease, stage 4 (severe): Secondary | ICD-10-CM | POA: Diagnosis not present

## 2021-05-23 DIAGNOSIS — D631 Anemia in chronic kidney disease: Secondary | ICD-10-CM | POA: Diagnosis not present

## 2021-05-23 NOTE — Patient Instructions (Signed)
Everything seems to be going very well, I am very excited for you! ? ?I would recommend just calling up with dermatologist or the skin surgery center to see if he can get a second opinion.  If they tell you that you need a referral, then just let me know and I can send it into them I does need to know which office and if you have a particular doctor you want. ? ?We will go ahead and check hemoglobin today just to make sure everything is fine but I am hopeful everything will be stable. ?

## 2021-05-23 NOTE — Assessment & Plan Note (Signed)
Wound on scalp waxes and wanes in healing process.  Currently seeing Dr. Ronnald Ramp with dermatology but patient and family would like to consider a second opinion to see if there are any other options for treatment. ?- Discussed calling the skin surgery center or another dermatologist ?- If the offices require a new referral, we will place him at that time. ?

## 2021-05-23 NOTE — Progress Notes (Addendum)
? ? ?  SUBJECTIVE:  ? ?CHIEF COMPLAINT / HPI:  ? ?Patient reports that he feels like he is doing much better now than he previously was.  He is not having any dizziness and the fatigue has much improved.  He is excited that he was able to get his catheter out a few weeks ago and has been not having any symptoms since then. ? ?His most concerning thing right now is that his wound from the melanoma removal on the top of his head has continued to wax and wane.  They are not going to do any grafts and he thinks that he would like to have a second opinion regarding the treatment.  He is currently using dapsone gel for treatment but that is more expensive than they would like. ? ?PERTINENT  PMH / PSH: Reviewed ? ?OBJECTIVE:  ? ?BP (!) 130/52   Pulse (!) 56   Ht '5\' 9"'$  (1.753 m)   Wt 111 lb 3.2 oz (50.4 kg)   SpO2 100%   BMI 16.42 kg/m?   ?Gen: NAD, elderly male sitting in clinic ?CV: RRR, no m/r/g appreciated, no peripheral edema ?Pulm: CTAB, no wheezes/crackles ?GI: soft, non-tender, non-distended ?HEENT: head wound covered and not visualized during this encounter ? ?ASSESSMENT/PLAN:  ? ?Anemia in stage 4 chronic kidney disease ([place]) ?Patient has required multiple transfusions in the recent past.  We will obtain a CBC today.  If it remains stable, we will evaluate again if patient starts to have symptoms concerning for blood loss. ?- CBC today ? ?Malignant melanoma of skin of scalp ([place]) ?Wound on scalp waxes and wanes in healing process.  Currently seeing Dr. Ronnald Ramp with dermatology but patient and family would like to consider a second opinion to see if there are any other options for treatment. ?- Discussed calling the skin surgery center or another dermatologist ?- If the offices require a new referral, we will place him at that time. ?  ? ? ?Johnice Riebe, DO ?[place]  ?

## 2021-05-23 NOTE — Assessment & Plan Note (Signed)
Patient has required multiple transfusions in the recent past.  We will obtain a CBC today.  If it remains stable, we will evaluate again if patient starts to have symptoms concerning for blood loss. ?- CBC today ?

## 2021-05-24 LAB — CBC
Hematocrit: 32 % — ABNORMAL LOW (ref 37.5–51.0)
Hemoglobin: 10.6 g/dL — ABNORMAL LOW (ref 13.0–17.7)
MCH: 28.6 pg (ref 26.6–33.0)
MCHC: 33.1 g/dL (ref 31.5–35.7)
MCV: 86 fL (ref 79–97)
Platelets: 279 10*3/uL (ref 150–450)
RBC: 3.71 x10E6/uL — ABNORMAL LOW (ref 4.14–5.80)
RDW: 13.4 % (ref 11.6–15.4)
WBC: 6.1 10*3/uL (ref 3.4–10.8)

## 2021-06-03 ENCOUNTER — Telehealth: Payer: Self-pay

## 2021-06-03 NOTE — Telephone Encounter (Signed)
Volunteer check in call for palliative care, no answer 

## 2021-06-15 ENCOUNTER — Emergency Department (HOSPITAL_COMMUNITY): Payer: Medicare Other

## 2021-06-15 ENCOUNTER — Other Ambulatory Visit: Payer: Self-pay

## 2021-06-15 ENCOUNTER — Inpatient Hospital Stay (HOSPITAL_COMMUNITY)
Admission: EM | Admit: 2021-06-15 | Discharge: 2021-06-17 | DRG: 690 | Disposition: A | Discharge status: Home Health | Payer: Medicare Other | Attending: Family Medicine | Admitting: Family Medicine

## 2021-06-15 ENCOUNTER — Encounter (HOSPITAL_COMMUNITY): Payer: Self-pay | Admitting: Emergency Medicine

## 2021-06-15 DIAGNOSIS — E860 Dehydration: Secondary | ICD-10-CM | POA: Diagnosis present

## 2021-06-15 DIAGNOSIS — E559 Vitamin D deficiency, unspecified: Secondary | ICD-10-CM | POA: Diagnosis not present

## 2021-06-15 DIAGNOSIS — Z681 Body mass index (BMI) 19 or less, adult: Secondary | ICD-10-CM

## 2021-06-15 DIAGNOSIS — K219 Gastro-esophageal reflux disease without esophagitis: Secondary | ICD-10-CM | POA: Diagnosis not present

## 2021-06-15 DIAGNOSIS — H409 Unspecified glaucoma: Secondary | ICD-10-CM | POA: Diagnosis present

## 2021-06-15 DIAGNOSIS — S0990XA Unspecified injury of head, initial encounter: Secondary | ICD-10-CM | POA: Diagnosis not present

## 2021-06-15 DIAGNOSIS — I480 Paroxysmal atrial fibrillation: Secondary | ICD-10-CM | POA: Diagnosis present

## 2021-06-15 DIAGNOSIS — I959 Hypotension, unspecified: Secondary | ICD-10-CM | POA: Diagnosis present

## 2021-06-15 DIAGNOSIS — Z8673 Personal history of transient ischemic attack (TIA), and cerebral infarction without residual deficits: Secondary | ICD-10-CM

## 2021-06-15 DIAGNOSIS — N184 Chronic kidney disease, stage 4 (severe): Secondary | ICD-10-CM | POA: Diagnosis present

## 2021-06-15 DIAGNOSIS — Z85038 Personal history of other malignant neoplasm of large intestine: Secondary | ICD-10-CM | POA: Diagnosis not present

## 2021-06-15 DIAGNOSIS — Y92002 Bathroom of unspecified non-institutional (private) residence single-family (private) house as the place of occurrence of the external cause: Secondary | ICD-10-CM

## 2021-06-15 DIAGNOSIS — R54 Age-related physical debility: Secondary | ICD-10-CM | POA: Diagnosis present

## 2021-06-15 DIAGNOSIS — N390 Urinary tract infection, site not specified: Secondary | ICD-10-CM | POA: Diagnosis not present

## 2021-06-15 DIAGNOSIS — R55 Syncope and collapse: Secondary | ICD-10-CM | POA: Diagnosis not present

## 2021-06-15 DIAGNOSIS — Z87891 Personal history of nicotine dependence: Secondary | ICD-10-CM

## 2021-06-15 DIAGNOSIS — E440 Moderate protein-calorie malnutrition: Secondary | ICD-10-CM | POA: Diagnosis present

## 2021-06-15 DIAGNOSIS — Z7901 Long term (current) use of anticoagulants: Secondary | ICD-10-CM | POA: Diagnosis not present

## 2021-06-15 DIAGNOSIS — Z880 Allergy status to penicillin: Secondary | ICD-10-CM | POA: Diagnosis not present

## 2021-06-15 DIAGNOSIS — E785 Hyperlipidemia, unspecified: Secondary | ICD-10-CM | POA: Diagnosis present

## 2021-06-15 DIAGNOSIS — R531 Weakness: Secondary | ICD-10-CM | POA: Diagnosis not present

## 2021-06-15 DIAGNOSIS — Z79899 Other long term (current) drug therapy: Secondary | ICD-10-CM

## 2021-06-15 DIAGNOSIS — Z20822 Contact with and (suspected) exposure to covid-19: Secondary | ICD-10-CM | POA: Diagnosis present

## 2021-06-15 DIAGNOSIS — Z85820 Personal history of malignant melanoma of skin: Secondary | ICD-10-CM

## 2021-06-15 DIAGNOSIS — W1839XA Other fall on same level, initial encounter: Secondary | ICD-10-CM | POA: Diagnosis present

## 2021-06-15 DIAGNOSIS — W19XXXA Unspecified fall, initial encounter: Secondary | ICD-10-CM | POA: Diagnosis not present

## 2021-06-15 DIAGNOSIS — Z9049 Acquired absence of other specified parts of digestive tract: Secondary | ICD-10-CM | POA: Diagnosis not present

## 2021-06-15 DIAGNOSIS — E890 Postprocedural hypothyroidism: Secondary | ICD-10-CM | POA: Diagnosis not present

## 2021-06-15 DIAGNOSIS — Z743 Need for continuous supervision: Secondary | ICD-10-CM | POA: Diagnosis not present

## 2021-06-15 DIAGNOSIS — Z66 Do not resuscitate: Secondary | ICD-10-CM | POA: Diagnosis not present

## 2021-06-15 DIAGNOSIS — R6889 Other general symptoms and signs: Secondary | ICD-10-CM | POA: Diagnosis not present

## 2021-06-15 DIAGNOSIS — I672 Cerebral atherosclerosis: Secondary | ICD-10-CM | POA: Diagnosis not present

## 2021-06-15 DIAGNOSIS — D631 Anemia in chronic kidney disease: Secondary | ICD-10-CM | POA: Diagnosis not present

## 2021-06-15 LAB — CBC WITH DIFFERENTIAL/PLATELET
Abs Immature Granulocytes: 0.06 10*3/uL (ref 0.00–0.07)
Basophils Absolute: 0 10*3/uL (ref 0.0–0.1)
Basophils Relative: 0 %
Eosinophils Absolute: 0 10*3/uL (ref 0.0–0.5)
Eosinophils Relative: 0 %
HCT: 36.4 % — ABNORMAL LOW (ref 39.0–52.0)
Hemoglobin: 11.3 g/dL — ABNORMAL LOW (ref 13.0–17.0)
Immature Granulocytes: 1 %
Lymphocytes Relative: 5 %
Lymphs Abs: 0.7 10*3/uL (ref 0.7–4.0)
MCH: 28.6 pg (ref 26.0–34.0)
MCHC: 31 g/dL (ref 30.0–36.0)
MCV: 92.2 fL (ref 80.0–100.0)
Monocytes Absolute: 0.8 10*3/uL (ref 0.1–1.0)
Monocytes Relative: 7 %
Neutro Abs: 10.6 10*3/uL — ABNORMAL HIGH (ref 1.7–7.7)
Neutrophils Relative %: 87 %
Platelets: 260 10*3/uL (ref 150–400)
RBC: 3.95 MIL/uL — ABNORMAL LOW (ref 4.22–5.81)
RDW: 13.7 % (ref 11.5–15.5)
WBC: 12.2 10*3/uL — ABNORMAL HIGH (ref 4.0–10.5)
nRBC: 0 % (ref 0.0–0.2)

## 2021-06-15 LAB — SARS CORONAVIRUS 2 BY RT PCR: SARS Coronavirus 2 by RT PCR: NEGATIVE

## 2021-06-15 LAB — CBG MONITORING, ED: Glucose-Capillary: 148 mg/dL — ABNORMAL HIGH (ref 70–99)

## 2021-06-15 LAB — BASIC METABOLIC PANEL
Anion gap: 6 (ref 5–15)
BUN: 28 mg/dL — ABNORMAL HIGH (ref 8–23)
CO2: 23 mmol/L (ref 22–32)
Calcium: 9.2 mg/dL (ref 8.9–10.3)
Chloride: 108 mmol/L (ref 98–111)
Creatinine, Ser: 2.64 mg/dL — ABNORMAL HIGH (ref 0.61–1.24)
GFR, Estimated: 24 mL/min — ABNORMAL LOW (ref >60)
Glucose, Bld: 141 mg/dL — ABNORMAL HIGH (ref 70–99)
Potassium: 4.8 mmol/L (ref 3.5–5.1)
Sodium: 137 mmol/L (ref 135–145)

## 2021-06-15 LAB — URINALYSIS, ROUTINE W REFLEX MICROSCOPIC
Bilirubin Urine: NEGATIVE
Glucose, UA: NEGATIVE mg/dL
Ketones, ur: NEGATIVE mg/dL
Nitrite: POSITIVE — AB
Protein, ur: 30 mg/dL — AB
Specific Gravity, Urine: 1.01 (ref 1.005–1.030)
WBC, UA: >50 WBC/hpf — ABNORMAL HIGH (ref 0–5)
pH: 6 (ref 5.0–8.0)

## 2021-06-15 LAB — TROPONIN I (HIGH SENSITIVITY)
Troponin I (High Sensitivity): 7 ng/L (ref <18)
Troponin I (High Sensitivity): 6 ng/L (ref <18)

## 2021-06-15 MED ORDER — FERROUS SULFATE 325 (65 FE) MG PO TABS
325.0000 mg | ORAL_TABLET | Freq: Every day | ORAL | Status: DC
  Administered 2021-06-16 – 2021-06-17 (×2): 325 mg via ORAL
  Filled 2021-06-15 (×2): qty 1

## 2021-06-15 MED ORDER — FAMOTIDINE 20 MG PO TABS
20.0000 mg | ORAL_TABLET | Freq: Two times a day (BID) | ORAL | Status: DC
  Administered 2021-06-15 – 2021-06-16 (×2): 20 mg via ORAL
  Filled 2021-06-15 (×2): qty 1

## 2021-06-15 MED ORDER — SODIUM CHLORIDE 0.9 % IV SOLN: INTRAVENOUS | Status: DC

## 2021-06-15 MED ORDER — APIXABAN 2.5 MG PO TABS
2.5000 mg | ORAL_TABLET | Freq: Two times a day (BID) | ORAL | Status: DC
  Administered 2021-06-15 – 2021-06-17 (×4): 2.5 mg via ORAL
  Filled 2021-06-15 (×4): qty 1

## 2021-06-15 MED ORDER — SODIUM CHLORIDE 0.9 % IV SOLN: 1.0000 g | INTRAVENOUS | Status: DC

## 2021-06-15 MED ORDER — ATORVASTATIN CALCIUM 40 MG PO TABS
40.0000 mg | ORAL_TABLET | Freq: Every day | ORAL | Status: DC
  Administered 2021-06-15 – 2021-06-17 (×2): 40 mg via ORAL
  Filled 2021-06-15 (×3): qty 1

## 2021-06-15 MED ORDER — SODIUM CHLORIDE 0.9 % IV BOLUS
1000.0000 mL | Freq: Once | INTRAVENOUS | Status: AC
  Administered 2021-06-15: 1000 mL via INTRAVENOUS

## 2021-06-15 MED ORDER — SODIUM CHLORIDE 0.9 % IV SOLN
1.0000 g | Freq: Once | INTRAVENOUS | Status: AC
  Administered 2021-06-15: 1 g via INTRAVENOUS
  Filled 2021-06-15: qty 10

## 2021-06-15 NOTE — Evaluation (Signed)
Physical Therapy Evaluation Patient Details Name: Keith [name] MRN: [id number] DOB: 21-Jan-1940 Today's Date: 06/15/2021  History of Present Illness  81 y.o. male presents to [facility] ED on 06/15/2021 with possible syncope, fall and head inury. Pt pending admission for management of possible UTI. PMH includes  pAF, colon cancer, SAH, CVA, HLD, CKD stage IV, GERD, IDA and urinary retention with indwelling Foley catheter.  Clinical Impression  Pt presents to PT with mild deficits in strength, power, balance, and gait. Pt is able to perform all mobility during session without physical assistance, demonstrating mild drift and requiring UE support for dynamic standing balance activities. Pt does demonstrate a drop in BP from supine to sitting and sitting to standing, but BP recovers with increased time standing and with mobilization. Pt denies symptoms during session. PT recommends discharge home with HHPT when medically ready.       Recommendations for follow up therapy are one component of a multi-disciplinary discharge planning process, led by the attending physician.  Recommendations may be updated based on patient status, additional functional criteria and insurance authorization.  Follow Up Recommendations Home health PT    Assistance Recommended at Discharge Intermittent Supervision/Assistance  Patient can return home with the following  A Macias help with walking and/or transfers;A Macke help with bathing/dressing/bathroom;Assistance with cooking/housework;Assist for transportation;Help with stairs or ramp for entrance    Equipment Recommendations None recommended by PT  Recommendations for Other Services       Functional Status Assessment Patient has had a recent decline in their functional status and demonstrates the ability to make significant improvements in function in a reasonable and predictable amount of time.     Precautions / Restrictions Precautions Precautions: Fall Precaution  Comments: monitor BP Restrictions Weight Bearing Restrictions: No      Mobility  Bed Mobility Overal bed mobility: Needs Assistance Bed Mobility: Supine to Sit, Sit to Supine     Supine to sit: Supervision Sit to supine: Supervision        Transfers Overall transfer level: Needs assistance Equipment used: None Transfers: Sit to/from Stand Sit to Stand: Supervision                Ambulation/Gait Ambulation/Gait assistance: Supervision Gait Distance (Feet): 200 Feet Assistive device: None Gait Pattern/deviations: Step-through pattern Gait velocity: reduced Gait velocity interpretation: <1.8 ft/sec, indicate of risk for recurrent falls   General Gait Details: pt with slowed step-through gait, drifting laterally intermittently  Stairs            Wheelchair Mobility    Modified Rankin (Stroke Patients Only)       Balance Overall balance assessment: Needs assistance Sitting-balance support: No upper extremity supported, Feet supported Sitting balance-[name] Scale: Good     Standing balance support: No upper extremity supported, During functional activity Standing balance-[name] Scale: Good                               Pertinent Vitals/Pain Pain Assessment Pain Assessment: No/denies pain    Home Living Family/patient expects to be discharged to:: Private residence Living Arrangements: Other relatives (granddaughter) Available Help at Discharge: Family;Available 24 hours/day Type of Home: House Home Access: Stairs to enter Entrance Stairs-Rails: None Entrance Stairs-Number of Steps: 1+1   Home Layout: One level Home Equipment: Conservation officer, nature (2 wheels);Cane - single point;BSC/3in1;Shower seat      Prior Function Prior Level of Function : Independent/Modified Independent  Mobility Comments: pt reports ambulating without device in the home and with cane outdoors. ADLs Comments: Pt is able to bathe and dress  himself, granddaughter assists with household chores and cooking     Hand Dominance   Dominant Hand: Right    Extremity/Trunk Assessment   Upper Extremity Assessment Upper Extremity Assessment: Overall WFL for tasks assessed    Lower Extremity Assessment Lower Extremity Assessment: Generalized weakness    Cervical / Trunk Assessment Cervical / Trunk Assessment: Kyphotic  Communication   Communication: No difficulties  Cognition Arousal/Alertness: Awake/alert Behavior During Therapy: WFL for tasks assessed/performed Overall Cognitive Status: Within Functional Limits for tasks assessed                                          General Comments General comments (skin integrity, edema, etc.): VSS on RA, orthostatic BP documented in vitals    Exercises     Assessment/Plan    PT Assessment Patient needs continued PT services  PT Problem List Decreased strength;Decreased activity tolerance;Decreased balance;Decreased mobility       PT Treatment Interventions DME instruction;Gait training;Stair training;Functional mobility training;Therapeutic activities;Therapeutic exercise;Balance training;Neuromuscular re-education;Patient/family education    PT Goals (Current goals can be found in the Care Plan section)  Acute Rehab PT Goals Patient Stated Goal: to regain strength and return to prior level of function PT Goal Formulation: With patient Time For Goal Achievement: 06/29/21 Potential to Achieve Goals: Good    Frequency Min 3X/week     Co-evaluation               AM-PAC PT "6 Clicks" Mobility  Outcome Measure Help needed turning from your back to your side while in a flat bed without using bedrails?: A Milks Help needed moving from lying on your back to sitting on the side of a flat bed without using bedrails?: A Macqueen Help needed moving to and from a bed to a chair (including a wheelchair)?: A Fayad Help needed standing up from a chair  using your arms (e.g., wheelchair or bedside chair)?: A Olvey Help needed to walk in hospital room?: A Hoff Help needed climbing 3-5 steps with a railing? : A Ivanoff 6 Click Score: 18    End of Session   Activity Tolerance: Patient tolerated treatment well Patient left: in bed;with call bell/phone within reach Nurse Communication: Mobility status PT Visit Diagnosis: Other abnormalities of gait and mobility (R26.89);Muscle weakness (generalized) (M62.81)    Time: 1650-1720 PT Time Calculation (min) (ACUTE ONLY): 30 min   Charges:   PT Evaluation $PT Eval Low Complexity: [place], PT, DPT Acute Rehabilitation Pager: [phone] Office [phone]   [name] 06/15/2021, 5:32 PM

## 2021-06-15 NOTE — ED Triage Notes (Signed)
Pt arrived via [facility] from home as a Level 2 Fall on Thinners. Per EMS pt had a unwitnessed fall this morning. Pt stated he went to bathroom and passed out where he woke on floor and had hit his head. Pt previous had a procedure on his head which is where he hit, opening the spot back up. Pt is on eliquis. Complaints of weakness and HA  64HR, 121/83, 98%, CBG 208

## 2021-06-15 NOTE — Discharge Instructions (Signed)
Dear Keith [name],  Thank you for letting us participate in your care. You were hospitalized for weakness and a fall. You were also found to have a UTI.   POST-HOSPITAL & CARE INSTRUCTIONS Continue your antibiotic as prescribed until complete. Do not miss any doses.  Restart your midodrine  Follow up with your primary care physician (PCP) Dr. [name]. Appointment details below. If this day and time does not work well for you, please call the clinic directly to reschedule.  Go to your follow up appointments (listed below)  DOCTOR'S APPOINTMENT   Future Appointments  Date Time Provider [place]  06/24/2021  9:30 AM Rise Patience, DO [facility] [place]  06/28/2021 10:00 AM [facility] None  07/05/2021  3:00 PM [name], MD [facility] None  07/07/2021  1:00 PM [name], [name], DO [facility] None    Follow-up Information     [name], [name], DO. Go on 06/24/2021.   Specialty: Family Medicine Why: At 9:30 am. Please arrive by 9:15 am. This is your hospital follow up appointment with your primary care doctor, Dr. [name]. Contact information: [place] [address] [phone]         [name], MD .   Specialty: Cardiology Contact information: [address] [address] [phone]               Take care and be well!  [facility]  [place], [place] [address] [phone]

## 2021-06-15 NOTE — Hospital Course (Addendum)
Keith [name] is a 81 year-old male who was admitted after a fall, found to have UTI. PMH significant for paroxysmal A fib on eliquis, colon cancer s/p resection, CVA, HLD, CKD stage 4, urinary retention. Hospital course is outlined below:  Fall Pt presented after fall at home with head trauma, LOC. CT head negative for acute abnormality. CXR without . Likely secondary to dehydration, UTI and progressive frailty/weakness. PT recommended home PT.   UTI UA positive for infection. He was started on CTX before transitioning to PO *** for a total of *** days antibiotics.  Hypotension Resolved with IVF. Home Finasteride was held.  Paroxysmal atrial fibrillation Pt remained in sinus bradycardia throughout admission. *** Home Eliquis was continued.  Anemia Hgb 11.3 on admission.  Other conditions chronic and stable:   PCP follow-up items:

## 2021-06-15 NOTE — ED Provider Notes (Signed)
[facility] EMERGENCY DEPARTMENT Provider Note   CSN: [id number] Arrival date & time: 06/15/21  2725     History  Chief Complaint  Patient presents with  . Level 2  . Fall    Keith [name] is a 81 y.o. male with history of melanoma and excisional biopsy of the scalp presenting to the ED with a fall and head injury.  The patient reports that he began to feel lightheaded this morning, went in the bathroom and passed out, thinks he struck his head on the edge of the sink.  He is not certain whether he truly lost consciousness or simply became very lightheaded.  He does take Eliquis.  He reports he has a mild headache, denies any pain in the rest of his body, including the hips or the extremities or chest.  He feels back to normal now.  He says he changes his bandages on his scalp about once every other day.  Medical record review shows that he does get chronic iron infusions  Last hgb was 10.6 on 05/23/21 check.  He has been anemic to 6.3 in the past 7 months (hgb level)  HPI     Home Medications Prior to Admission medications   Medication Sig Start Date End Date Taking? Authorizing Provider  ACETAMINOPHEN EXTRA STRENGTH 500 MG tablet NEW PRESCRIPTION REQUEST: TAKE ONE TABLET BY MOUTH EVERY 6 HOURS AS NEEDED 10/05/20  Yes [name], [name], DO  atorvastatin (LIPITOR) 40 MG tablet NEW PRESCRIPTION REQUEST: TAKE ONE TABLET BY MOUTH EVERY MORNING 10/05/20  Yes [name], [name], DO  cholecalciferol (VITAMIN D) 25 MCG (1000 UNIT) tablet NEW PRESCRIPTION REQUEST: TAKE ONE TABLET BY MOUTH EVERY MORNING 10/05/20  Yes [name], [name], DO  ELIQUIS 2.5 MG TABS tablet TAKE 1 TABLET BY MOUTH TWICE A DAY 10/29/20  Yes Shirley Friar, PA-C  famotidine (PEPCID) 20 MG tablet Take 20 mg by mouth 2 (two) times daily.   Yes [provider]  Ferrous Sulfate (IRON PO) Take 1 tablet by mouth daily.   Yes [provider]  finasteride (PROSCAR) 5 MG tablet Take 5 mg by mouth  daily.   Yes [provider]  loperamide (IMODIUM) 2 MG capsule Take by mouth as needed for diarrhea or loose stools.   Yes [provider]  timolol (TIMOPTIC) 0.5 % ophthalmic solution NEW PRESCRIPTION REQUEST: INSTILL ONE DROP IN [facility] IN THE MORNING AND IN THE EVENING 10/05/20  Yes [name], [name], DO  Dapsone 5 % topical gel Apply 1 application topically 2 (two) times a week. Patient not taking: Reported on 06/15/2021 02/08/18   [provider]  Ensure Plus (ENSURE PLUS) LIQD Take 237 mLs by mouth 2 (two) times daily between meals. Choc or Vanilla Patient not taking: Reported on 06/15/2021    [provider]  midodrine (PROAMATINE) 2.5 MG tablet TAKE 1 TABLET (2.5 MG TOTAL) BY MOUTH 2 (TWO) TIMES DAILY WITH A MEAL. Patient not taking: Reported on 06/15/2021 11/02/20   Rise Patience, DO  Multiple Vitamin (MULTIVITAMIN) capsule Take 1 capsule by mouth daily.  Patient not taking: Reported on 06/15/2021    [provider]      Allergies    Penicillins    Review of Systems   Review of Systems  Physical Exam Updated Vital Signs BP (!) 129/44   Pulse (!) 54   Temp 97.9 F (36.6 C) (Temporal)   Resp 13   Ht '5\' 9"'$  (1.753 m)   Wt 51.7 kg  SpO2 99%   BMI 16.83 kg/m  Physical Exam Constitutional:      General: He is not in acute distress.    Comments: Thin, frail, chronically ill-appearing  HENT:     Head: Normocephalic and atraumatic.     Comments: Scalp biopsy sites do have some purulence, mild, after peeling back Band-Aids -there is no surrounding erythema Eyes:     Conjunctiva/sclera: Conjunctivae normal.     Pupils: Pupils are equal, round, and reactive to light.  Cardiovascular:     Rate and Rhythm: Normal rate. Rhythm irregular.  Pulmonary:     Effort: Pulmonary effort is normal. No respiratory distress.  Abdominal:     General: There is no distension.     Tenderness: There is no abdominal tenderness.  Skin:    General: Skin  is warm and dry.  Neurological:     General: No focal deficit present.     Mental Status: He is alert. Mental status is at baseline.  Psychiatric:        Mood and Affect: Mood normal.        Behavior: Behavior normal.    ED Results / Procedures / Treatments   Labs (all labs ordered are listed, but only abnormal results are displayed) Labs Reviewed  BASIC METABOLIC PANEL - Abnormal; Notable for the following components:      Result Value   Glucose, Bld 141 (*)    BUN 28 (*)    Creatinine, Ser 2.64 (*)    GFR, Estimated 24 (*)    All other components within normal limits  CBC WITH DIFFERENTIAL/PLATELET - Abnormal; Notable for the following components:   WBC 12.2 (*)    RBC 3.95 (*)    Hemoglobin 11.3 (*)    HCT 36.4 (*)    Neutro Abs 10.6 (*)    All other components within normal limits  URINALYSIS, ROUTINE W REFLEX MICROSCOPIC - Abnormal; Notable for the following components:   APPearance CLOUDY (*)    Hgb urine dipstick SMALL (*)    Protein, ur 30 (*)    Nitrite POSITIVE (*)    Leukocytes,Ua LARGE (*)    WBC, UA >50 (*)    Bacteria, UA MANY (*)    All other components within normal limits  CBG MONITORING, ED - Abnormal; Notable for the following components:   Glucose-Capillary 148 (*)    All other components within normal limits  SARS CORONAVIRUS 2 BY RT PCR  URINE CULTURE  TROPONIN I (HIGH SENSITIVITY)  TROPONIN I (HIGH SENSITIVITY)    EKG None  Radiology DG Chest 2 View  Result Date: 06/15/2021 CLINICAL DATA:  Fall today with head injury.  Rule out pneumonia EXAM: CHEST - 2 VIEW COMPARISON:  07/07/2020 FINDINGS: The heart size and mediastinal contours are within normal limits. Cardiac loop recorder noted. Atherosclerotic calcification aortic arch. Both lungs are clear. The visualized skeletal structures are unremarkable. IMPRESSION: No active cardiopulmonary disease. Electronically Signed   By: Franchot Gallo M.D.   On: 06/15/2021 14:13   CT HEAD WO CONTRAST  (5MM)  Result Date: 06/15/2021 CLINICAL DATA:  Unwitnessed fall in bathroom this morning, passed out, woke up on floor after having struck his head, on Eliquis. History melanoma, stroke, former smoker EXAM: CT HEAD WITHOUT CONTRAST TECHNIQUE: Contiguous axial images were obtained from the base of the skull through the vertex without intravenous contrast. RADIATION DOSE REDUCTION: This exam was performed according to the departmental dose-optimization program which includes automated exposure control, adjustment of the  mA and/or kV according to patient size and/or use of iterative reconstruction technique. COMPARISON:  09/22/2020 FINDINGS: Brain: Generalized atrophy. Normal ventricular morphology. No midline shift or mass effect. Small vessel chronic ischemic changes of deep cerebral white matter. Small old LEFT frontal infarct. No intracranial hemorrhage, mass lesion, evidence of acute infarction, or extra-axial fluid collection. Vascular: Atherosclerotic calcifications of internal carotid and vertebral arteries at skull base Skull: Intact Sinuses/Orbits: Tiny air-fluid level LEFT maxillary sinus. Remaining paranasal sinuses and mastoid air cells clear Other: N/A IMPRESSION: Atrophy with small vessel chronic ischemic changes of deep cerebral white matter. Small old LEFT frontal infarct. No acute intracranial abnormalities. Electronically Signed   By: Lavonia Dana M.D.   On: 06/15/2021 09:22    Procedures Procedures    Medications Ordered in ED Medications  cefTRIAXone (ROCEPHIN) 1 g in sodium chloride 0.9 % 100 mL IVPB (1 g Intravenous New Bag/Given 06/15/21 1519)  sodium chloride 0.9 % bolus 1,000 mL (0 mLs Intravenous Stopped 06/15/21 1137)    ED Course/ Medical Decision Making/ A&P Clinical Course as of 06/15/21 1537  Wed Jun 15, 2021  1343 On my reassessment the patient reports that he is not able to urinate at this point, though he has tried and feels he needs to.  He says he has had problems with  urinary retention in the past requiring a Foley catheter.  I think is reasonable to insert a catheter at this point to collect a UA.  He also reports to me now that he had fevers, shaking chills last night, and felt weak this morning.  He says he has had "aspiration pneumonia" in the past.  He denies cough or shortness of breath.  I have added on a chest x-ray and a COVID swab. [MT]  1522 UA was consistent with an infection, Foley catheter is in place at this point, he will be started on Rocephin, patient reports that he feels too weak for safe discharge home, and I think it is reasonable to admit him for observation overnight for IV antibiotics.  He is in agreement.  Family practice service consulted [MT]  1537 Admitted to family practice [MT]    Clinical Course User Index [MT] Radames Mejorado, Carola Rhine, MD                           Medical Decision Making Amount and/or Complexity of Data Reviewed Labs: ordered. Radiology: ordered. ECG/medicine tests: ordered.  Risk Decision regarding hospitalization.   This patient presents to the ED with concern for near syncope, head injury. This involves an extensive number of treatment options, and is a complaint that carries with it a high risk of complications and morbidity.  The differential diagnosis includes symptomatic anemia versus dehydration versus arrhythmia versus orthostatic hypotension versus other  Co-morbidities that complicate the patient evaluation: Patient is on Eliquis which raises risk of intracranial bleed from head injury  Additional history obtained from EMS and patient's  External records from outside source obtained and reviewed including iron transfusion history for chronic anemia  I ordered and personally interpreted labs.  The pertinent results include: UA with positive leukocytes, nitrites, white blood cell, LE elevated at 12.2, troponin unremarkable, BMP close to baseline, very minor elevation of BUN and creatinine.  I ordered  imaging studies including CT imaging of the head, x-ray of the chest I independently visualized and interpreted imaging which showed no focal infiltrate or evidence of pneumonia I agree with the radiologist  interpretation  Based on my clinical exam, I do not see evidence of any other traumatic injuries or fractures to warrant further x-ray imaging at this time.  He is not hypoxic or complaining of chest pain or cough to raise concern for pneumonia.  The patient was maintained on a cardiac monitor.  I personally viewed and interpreted the cardiac monitored which showed an underlying rhythm of: Sinus rhythm  Per my interpretation the patient's ECG shows normal sinus rhythm  I ordered medication including IV fluids, IV antibiotics for UTI and dehydration I have reviewed the patients home medicines and have made adjustments as needed  Test Considered: Suspicion for acute PE, do not feel CT PE indicated at this time  After the interventions noted above, I reevaluated the patient and found that they have: stayed the same  He otherwise does not meet SIRS or sepsis criteria, with only a small isolated leukocytosis, I do not believe any blood cultures at this time, will continue with antibiotics and fluids as an inpatient.  Dispostion:  After consideration of the diagnostic results and the patients response to treatment, I feel that the patent would benefit from medical admission for UTI management         Final Clinical Impression(s) / ED Diagnoses Final diagnoses:  Urinary tract infection without hematuria, site unspecified    Rx / DC Orders ED Discharge Orders     None         Miana Politte, Carola Rhine, MD 06/15/21 1523

## 2021-06-15 NOTE — Progress Notes (Shared)
FPTS Brief Progress Note  S:Patient was laying comfortable in bed. He said he is doing well. Denies having any dizziness and has no complain at this time.   O: BP 124/61 Comment: BP at end of ambulation with PT  Pulse (!) 54   Temp 97.9 F (36.6 C) (Temporal)   Resp 13   Ht '5\' 9"'$  (1.753 m)   Wt 51.7 kg   SpO2 99%   BMI 16.83 kg/m    General: Alert, well appearing, NAD Pulm: Good work of breathing on room air   A/P: - Orders reviewed. Labs for AM ordered, which was adjusted as needed.  -Follow plan per day team.  Alen Bleacher, MD 06/15/2021, 8:04 PM PGY-1, [facility] Family Medicine Night Resident  Please page [phone] with questions.

## 2021-06-15 NOTE — Progress Notes (Signed)
Family medicine teaching service will be admitting this patient. Our pager information can be located in the physician sticky notes, treatment team sticky notes, and the headers of all our official daily progress notes.   FAMILY MEDICINE TEACHING SERVICE Patient - Please contact intern pager [phone] or text page via website [email or web address] (login: [email or web address]) for questions regarding care. DO NOT page listed attending provider unless there is no answer from the number above.   Decie Verne, MD PGY-2, [place] [facility] pager [phone]   

## 2021-06-15 NOTE — ED Notes (Signed)
Pt arrived via [facility] from home as a Level 2 Fall on [place]. Per EMS pt had a unwitnessed fall this morning. Pt stated he went to bathroom and passed out where he woke on floor and had hit his head. Pt previous had a procedure on his head which is where he hit, opening the spot back up. Pt is on eliquis. Complaints of weakness and HA  64HR, 121/83, 98%, CBG 208

## 2021-06-15 NOTE — H&P (Addendum)
[facility] Admission History and Physical Service Pager: [phone]  Patient name: Keith [name] Medical record number: [id number] Date of birth: 1940/05/12 Age: 81 y.o. Gender: male  Primary Care Provider: Rise Patience, DO Consultants: None Code Status: DNR which was confirmed with family if patient unable to confirm   Preferred Emergency Contact:  Contact Information     Name Relation Home Work Mobile   [place] Son [phone]  [phone]   Candace Cruise [phone]  [phone]      [place] [phone] (Granddaughter)  Chief Complaint: Fall, generalized weakness  Assessment and Plan: Keith [name] is a 81 y.o. male presenting with fall and generalized weakness. PMH is significant for HTN, urinary retention, dysphagia, paroxysmal atrial fibrillation, CKD, CVA and SAH, anemia, hx colon adenocarcinoma s/p transverse colon resection 08/06/2020, stage IIc scalp melanoma w/ left neck dissection 2019.  Generalized weakness  Fall  UTI  Hx frailty/decline in function Fall likely 2/2 dehydration w/ deconditioning/frailty and UTI Normal neuro exam- no concern for hemorrhage/ CT head without new Has hx of progressive weakness an frailty- thin but he is able to do things on his own as able- granddaughter helps (lives with pt) May need SNF for further rehabilitation, will await PT recommendations In the meantime, will treat UTI w/ IV abx and transition to PO when appropriate sensitivities result - Admit to med-surg, attending Dr. Ardelia Mems - PT/OT eval and treat - Orthostatic vital signs - Strict I/O - NS mIVF @ 90 mL/hr - RD consult - Fall precautions - S/p IV CTX x1, continue q 24h  - Echocardiogram  Urinary retention and UTI  BPH w/ hx TURP and b/l ureteral stents UA w/ + nitrites, large leuks, many bacteria, cloudy appearance No signs of sepsis/ systemic infection. Does have leukocytosis, 12.2k. Has hx urinary retention  w/ indwelling foley catheter in the past- failed 2 voiding trials w/ urology in the past Currently undergoing void trial w/ removal of foley catheter (per urology note in 10/2020) Takes Finasteride at home - Hold Finasteride given initial hypotension, can resume as appropriate - Foley catheter in place (inserted 5/31 in ED) with turbid urine noted - Strict I/O - F/u urine culture  Hx hiatal hernia and dysphagia Supposed to have modified barium swallow study in the past.   Regular diet for now- pt's granddaughter says he eats variety of foods at home but chews very carefully - SLP consult  Hypotension Initially hypotensive on admission at 90/60, resolved with fluid resuscitation. Takes midodrine in the past at home, but granddaughter says they have been holding it lately d/t normal BP - Monitor BP  Paroxysmal atrial fibrillation In sinus bradycardia today. Has had discussions w/ PCP regarding risks/benefits of Eliquis and possible discontinuing, but patient decided to continue with medication. - Cardiac monitoring - Continue Eliquis  CKD stage 4 Cr 2.64, GFR 24. [facility]/ [facility]rology [facility]. Had indwelling foley catheter in the past - Monitor kidney function w/ labs  Anemia, likely of chronic disease v. Bone marrow dysfunction  Hx Colon CA  Hx repeated blood transfusions Hgb 11. 3 on admission. Baseline of 8.0-10.0. EGD on 09/23/20 w/ erosive gastropathy w/o bleeding. Follows w/ oncology and has outpatient IV transfusions in the past- considering growth factor support - Monitor Hgb w/ CBC - PO ferrous sulfate 325 mg daily  Hx CVA Occluded lt ICA prox in 2018.  Goals of Care Follows o/p with palliative medicine- code status is DNR. He has a living will  and HCPOA on file- Keith [name]. MOST is to be completed at next palliative care appointment  FEN/GI: Regular diet Prophylaxis: SCDs  Disposition: Med-surg  History of Present Illness:  Keith [name] is a 81 y.o.  male presenting with . Felt well yesterday- cooked, cleaned and did laundry. Last night felt like he had a virus- nose started running, throat was sore. Went to bed and took 10 mg melatonin. Woke up this morning to go to the bathroom, felt woozy. When he stood up from the toilet, he lost consciusness and hit his head and shoulder on the vanity. He laid on the bathroom floor for a moment before waking up his granddaughter to help him.A& O x4. No headache, chest pain, no abdominal pain.  No shaking of extremities. No loss of bladder control. No sick contacts. Mostly stays at home. No pain, burning, frequency or hesitation.  No fever, chills. Temp at home was 98.6 F. Felt really clammy and sweaty past two days. Denies any burning with urination, frequency or hesitation. He was supposed to see his urologist today but was unable to make it d/t his fall. He was urinating without difficulty past few months- around 3 times per day. The past couple days he has had difficulty urinating.  Not drinking as much water as he should- granddaughter had him drinking 2 8 oz bottles a day. Says his appetite is good. Has not had anything to eat today. Feeling a [place] hungry.  In the ED, labs overall reassuring other than UA w/ signs of infection (leuks. + nitrites) and CXR without any active cardiopulmonary disease, CT  head without acute abnormality. He was offered to go home and treat UTI as an outpatient but pt desired admission due to weakness when standing up.    Review Of Systems: Per HPI with the following additions:   Review of Systems   Patient Active Problem List   Diagnosis Date Noted   Anemia 11/04/2020   Benign esophageal stricture    Acute blood loss anemia 09/22/2020   Difficulty with speech    Orthostatic hypotension 09/05/2020   Internal hernia 08/13/2020   Encounter for assessment for small bowel obstruciton 08/13/2020   Protein-calorie malnutrition, severe 08/11/2020   Cancer of transverse  colon ([place]) 08/06/2020   Coronary artery calcification 05/25/2020   Adenocarcinoma ([place]) 05/22/2019   Mass of colon    Symptomatic anemia 05/08/2019   Gastroesophageal reflux disease 02/18/2019   Iron deficiency 09/26/2018   Anemia in stage 4 chronic kidney disease ([place]) 05/21/2018   Vitamin D deficiency 05/21/2018   History of CVA (cerebrovascular accident) 01/31/2018   TSH elevation 01/28/2018   Underweight 01/24/2018   CKD (chronic kidney disease) stage 4, GFR 15-29 ml/min (HCC) 12/10/2017   Hypotension 12/10/2017   Paroxysmal atrial fibrillation ([place]) 11/07/2017   Bilateral carotid artery stenosis 09/03/2017   Malignant melanoma of skin of scalp ([place]) 03/26/2017   Hyperlipidemia 02/08/2017   SAH (subarachnoid hemorrhage) ([place]) 11/20/2016   Cerebrovascular accident (CVA) due to occlusion of left carotid artery ([place]) 11/20/2016    Past Medical History: Past Medical History:  Diagnosis Date   Anemia    Cataract    planning surgery   Chronic kidney disease    "weak Kidneys"   Complication of anesthesia    patient expressed that he has difficulty post anesthesia with swallowing, chewing, and talking after surgery   Dysrhythmia    GERD (gastroesophageal reflux disease)    tums prn   Glaucoma  History of hiatal hernia    Hypothyroidism    Hypothyroidism, postsurgical 11/20/2016   Left middle cerebral artery stroke ([place]) 11/23/2016   Melanoma ([place])    scalp   Melanoma of scalp ([place]) 04/23/2017   Obstructive uropathy    PONV (postoperative nausea and vomiting) 03/20/2017   Stroke ([place]) 11/2016   speech and right side at time of stroke 04/20/17- all function return to normal   Underweight 01/24/2018    Past Surgical History: Past Surgical History:  Procedure Laterality Date   APPLICATION OF A-CELL OF HEAD/NECK N/A 04/23/2017   Procedure: APPLICATION OF INTREGRA;  Surgeon: Irene Limbo, MD;  Location: [place];  Service: Plastics;  Laterality: N/A;   BIOPSY  05/10/2019    Procedure: BIOPSY;  Surgeon: [name], MD;  Location: [facility] ENDOSCOPY;  Service: Endoscopy;;   BIOPSY  09/23/2020   Procedure: BIOPSY;  Surgeon: [name], MD;  Location: [facility] ENDOSCOPY;  Service: Endoscopy;;   BIOPSY  09/24/2020   Procedure: BIOPSY;  Surgeon: [name], MD;  Location: [place];  Service: Endoscopy;;   CATARACT EXTRACTION     COLONOSCOPY WITH PROPOFOL N/A 05/10/2019   Procedure: COLONOSCOPY WITH PROPOFOL;  Surgeon: [name], MD;  Location: [facility] ENDOSCOPY;  Service: Endoscopy;  Laterality: N/A;   COLONOSCOPY WITH PROPOFOL N/A 09/24/2020   Procedure: COLONOSCOPY WITH PROPOFOL;  Surgeon: [name], MD;  Location: [facility] ENDOSCOPY;  Service: Endoscopy;  Laterality: N/A;   ESOPHAGOGASTRODUODENOSCOPY (EGD) WITH PROPOFOL N/A 05/10/2019   Procedure: ESOPHAGOGASTRODUODENOSCOPY (EGD) WITH PROPOFOL;  Surgeon: [name], MD;  Location: [facility] ENDOSCOPY;  Service: Endoscopy;  Laterality: N/A;   ESOPHAGOGASTRODUODENOSCOPY (EGD) WITH PROPOFOL N/A 09/23/2020   Procedure: ESOPHAGOGASTRODUODENOSCOPY (EGD) WITH PROPOFOL;  Surgeon: [name], MD;  Location: [facility] ENDOSCOPY;  Service: Endoscopy;  Laterality: N/A;   EXCISION MASS HEAD N/A 03/20/2017   Procedure: EXCISION scalp lesion;  Surgeon: Irene Limbo, MD;  Location: [place];  Service: Plastics;  Laterality: N/A;  EXCISION scalp lesion   EYE SURGERY     IR ANGIO INTRA EXTRACRAN SEL COM CAROTID INNOMINATE BILAT MOD SED  11/23/2016   IR ANGIO VERTEBRAL SEL VERTEBRAL UNI L MOD SED  11/23/2016   LOOP RECORDER INSERTION N/A 11/23/2016   Procedure: LOOP RECORDER INSERTION;  Surgeon: [name], MD;  Location: [place] CV LAB;  Service: Cardiovascular;  Laterality: N/A;   MASS EXCISION N/A 04/23/2017   Procedure: EXCISION MALIGNANT LESION OF SCALP;  Surgeon: Irene Limbo, MD;  Location: [place];  Service: Plastics;  Laterality: N/A;   MELANOMA EXCISION  04/23/2017   BILATERAL SUPERFICIAL PAROTIDECTOMY/NECK SENTINEL NODE BIOPSY WITH  FROZEN SECTION; LEFT SELECTIVE NECK DISSECTION   PAROTIDECTOMY Bilateral 04/23/2017   Procedure: BILATERAL SUPERFICIAL PAROTIDECTOMY/NECK SENTINEL NODE BIOPSY WITH FROZEN SECTION; LEFT SELECTIVE NECK DISSECTION;  Surgeon: [name], MD;  Location: [place];  Service: ENT;  Laterality: Bilateral;   POLYPECTOMY  05/10/2019   Procedure: POLYPECTOMY;  Surgeon: [name], MD;  Location: [facility] ENDOSCOPY;  Service: Endoscopy;;   POLYPECTOMY  09/24/2020   Procedure: POLYPECTOMY;  Surgeon: [name], MD;  Location: [place];  Service: Endoscopy;;   SKIN GRAFT SPLIT THICKNESS HEAD / NECK  06/04/2017   SKIN GRAFT SPLIT THICKNESS TO SCALP FROM RIGHT OR LEFT THIGH POSSIBLE A CELL TO DONOR SITE   SKIN SPLIT GRAFT N/A 06/04/2017   Procedure: SKIN GRAFT SPLIT THICKNESS TO SCALP FROM RIGHT OR LEFT THIGH POSSIBLE A CELL TO DONOR SITE;  Surgeon: Irene Limbo, MD;  Location: [place];  Service: Plastics;  Laterality: N/A;   SUBMUCOSAL TATTOO INJECTION  05/10/2019   Procedure: SUBMUCOSAL TATTOO INJECTION;  Surgeon: [name], MD;  Location: [facility] ENDOSCOPY;  Service: Endoscopy;;   THYROIDECTOMY     TONSILLECTOMY      Social History: Social History   Tobacco Use   Smoking status: Former    Packs/day: 2.00    Years: 32.00    Pack years: 64.00    Types: Cigarettes    Quit date: 1983    Years since quitting: 40.4   Smokeless tobacco: Never   Tobacco comments:    No plans to start  Vaping Use   Vaping Use: Never used  Substance Use Topics   Alcohol use: No   Drug use: No    Family History: Family History  Problem Relation Age of Onset   Lung cancer Father    Lung cancer Mother    Diabetes Brother    Colon cancer Neg Hx    Colon polyps Neg Hx    Esophageal cancer Neg Hx    Stomach cancer Neg Hx    Pancreatic cancer Neg Hx     Allergies and Medications: Allergies  Allergen Reactions   Penicillins Other (See Comments)    Has patient had a PCN reaction causing immediate rash,  facial/tongue/throat swelling, SOB or lightheadedness with hypotension: Unknown Has patient had a PCN reaction causing severe rash involving mucus membranes or skin necrosis: Unknown Has patient had a PCN reaction that required hospitalization: Unknown Has patient had a PCN reaction occurring within the last 10 years: No If all of the above answers are "NO", then may proceed with Cephalosporin use. Tolerated cephlasporins   No current facility-administered medications on file prior to encounter.   Current Outpatient Medications on File Prior to Encounter  Medication Sig Dispense Refill   ACETAMINOPHEN EXTRA STRENGTH 500 MG tablet NEW PRESCRIPTION REQUEST: TAKE ONE TABLET BY MOUTH EVERY 6 HOURS AS NEEDED 360 tablet 3   atorvastatin (LIPITOR) 40 MG tablet NEW PRESCRIPTION REQUEST: TAKE ONE TABLET BY MOUTH EVERY MORNING 90 tablet 3   cholecalciferol (VITAMIN D) 25 MCG (1000 UNIT) tablet NEW PRESCRIPTION REQUEST: TAKE ONE TABLET BY MOUTH EVERY MORNING 90 tablet 3   ELIQUIS 2.5 MG TABS tablet TAKE 1 TABLET BY MOUTH TWICE A DAY 180 tablet 1   famotidine (PEPCID) 20 MG tablet Take 20 mg by mouth 2 (two) times daily.     Ferrous Sulfate (IRON PO) Take 1 tablet by mouth daily.     finasteride (PROSCAR) 5 MG tablet Take 5 mg by mouth daily.     loperamide (IMODIUM) 2 MG capsule Take by mouth as needed for diarrhea or loose stools.     timolol (TIMOPTIC) 0.5 % ophthalmic solution NEW PRESCRIPTION REQUEST: INSTILL ONE DROP IN EACH EYE IN THE MORNING AND IN THE EVENING 45 mL 3   Dapsone 5 % topical gel Apply 1 application topically 2 (two) times a week. (Patient not taking: Reported on 06/15/2021)     Ensure Plus (ENSURE PLUS) LIQD Take 237 mLs by mouth 2 (two) times daily between meals. Choc or Vanilla (Patient not taking: Reported on 06/15/2021)     midodrine (PROAMATINE) 2.5 MG tablet TAKE 1 TABLET (2.5 MG TOTAL) BY MOUTH 2 (TWO) TIMES DAILY WITH A MEAL. (Patient not taking: Reported on 06/15/2021) 180  tablet 1   Multiple Vitamin (MULTIVITAMIN) capsule Take 1 capsule by mouth daily.  (Patient not taking: Reported on 06/15/2021)      Objective: BP (!) 129/44  Pulse (!) 54   Temp 97.9 F (36.6 C) (Temporal)   Resp 13   Ht '5\' 9"'$  (1.753 m)   Wt 51.7 kg   SpO2 99%   BMI 16.83 kg/m  Exam: General: Elderly gentleman in no distress sitting in bed, awake and alert Eyes: Pupils PERRLA, EOMI ENTM: Oropharynx clear, MMM, edentulous Neck: Normal ROM Cardiovascular: Sinus bradycardia in 50s, normal rhythm, no murmurs appreciated Respiratory: Normal WOB on room air. No wheezing or crackles Gastrointestinal: Soft, NTND, bowel sounds present MSK: No joint deformities Derm: A few abrasions over left shoulder and left forearm (pics in media tab), wearing dressing over head Neuro: A&O x4, Speech clear and fluent. Strength 5/5 in BL UE and LE, normal finger-to-nose testing, no focal neuro deficits. Psych: Very pleasant. Normal mood and affect.  Labs and Imaging: CBC BMET  Recent Labs  Lab 06/15/21 0855  WBC 12.2*  HGB 11.3*  HCT 36.4*  PLT 260   Recent Labs  Lab 06/15/21 0855  NA 137  K 4.8  CL 108  CO2 23  BUN 28*  CREATININE 2.64*  GLUCOSE 141*  CALCIUM 9.2      Orvis Brill, DO 06/15/2021, 3:35 PM PGY-1, [place] Intern pager: [phone], text pages welcome

## 2021-06-15 NOTE — Progress Notes (Signed)
Pt fall on thinners and hit head.. Pt went to bathroom and passed out. Current;y pt is ok.  Provided support.  Chaplain will follow as needed.  Jaclynn Major, [place], [facility], Pager [phone]

## 2021-06-15 NOTE — ED Notes (Signed)
Patient provided urinal but stated that he cannot urinate at this time. Will try again in a few minutes to get urine sample.

## 2021-06-16 ENCOUNTER — Observation Stay (HOSPITAL_COMMUNITY): Payer: Medicare Other

## 2021-06-16 DIAGNOSIS — Z85820 Personal history of malignant melanoma of skin: Secondary | ICD-10-CM | POA: Diagnosis not present

## 2021-06-16 DIAGNOSIS — W1839XA Other fall on same level, initial encounter: Secondary | ICD-10-CM | POA: Diagnosis present

## 2021-06-16 DIAGNOSIS — K219 Gastro-esophageal reflux disease without esophagitis: Secondary | ICD-10-CM | POA: Diagnosis present

## 2021-06-16 DIAGNOSIS — Z85038 Personal history of other malignant neoplasm of large intestine: Secondary | ICD-10-CM | POA: Diagnosis not present

## 2021-06-16 DIAGNOSIS — R55 Syncope and collapse: Secondary | ICD-10-CM | POA: Diagnosis not present

## 2021-06-16 DIAGNOSIS — I959 Hypotension, unspecified: Secondary | ICD-10-CM | POA: Diagnosis present

## 2021-06-16 DIAGNOSIS — R54 Age-related physical debility: Secondary | ICD-10-CM | POA: Diagnosis present

## 2021-06-16 DIAGNOSIS — Z20822 Contact with and (suspected) exposure to covid-19: Secondary | ICD-10-CM | POA: Diagnosis present

## 2021-06-16 DIAGNOSIS — N184 Chronic kidney disease, stage 4 (severe): Secondary | ICD-10-CM | POA: Diagnosis present

## 2021-06-16 DIAGNOSIS — E559 Vitamin D deficiency, unspecified: Secondary | ICD-10-CM | POA: Diagnosis present

## 2021-06-16 DIAGNOSIS — Z79899 Other long term (current) drug therapy: Secondary | ICD-10-CM | POA: Diagnosis not present

## 2021-06-16 DIAGNOSIS — Z880 Allergy status to penicillin: Secondary | ICD-10-CM | POA: Diagnosis not present

## 2021-06-16 DIAGNOSIS — D631 Anemia in chronic kidney disease: Secondary | ICD-10-CM | POA: Diagnosis present

## 2021-06-16 DIAGNOSIS — E785 Hyperlipidemia, unspecified: Secondary | ICD-10-CM | POA: Diagnosis present

## 2021-06-16 DIAGNOSIS — Z87891 Personal history of nicotine dependence: Secondary | ICD-10-CM | POA: Diagnosis not present

## 2021-06-16 DIAGNOSIS — E860 Dehydration: Secondary | ICD-10-CM | POA: Diagnosis present

## 2021-06-16 DIAGNOSIS — Y92002 Bathroom of unspecified non-institutional (private) residence single-family (private) house as the place of occurrence of the external cause: Secondary | ICD-10-CM | POA: Diagnosis not present

## 2021-06-16 DIAGNOSIS — Z9049 Acquired absence of other specified parts of digestive tract: Secondary | ICD-10-CM | POA: Diagnosis not present

## 2021-06-16 DIAGNOSIS — N390 Urinary tract infection, site not specified: Secondary | ICD-10-CM | POA: Diagnosis not present

## 2021-06-16 DIAGNOSIS — I480 Paroxysmal atrial fibrillation: Secondary | ICD-10-CM | POA: Diagnosis present

## 2021-06-16 DIAGNOSIS — R531 Weakness: Secondary | ICD-10-CM | POA: Diagnosis present

## 2021-06-16 DIAGNOSIS — H409 Unspecified glaucoma: Secondary | ICD-10-CM | POA: Diagnosis present

## 2021-06-16 DIAGNOSIS — E890 Postprocedural hypothyroidism: Secondary | ICD-10-CM | POA: Diagnosis present

## 2021-06-16 DIAGNOSIS — Z7901 Long term (current) use of anticoagulants: Secondary | ICD-10-CM | POA: Diagnosis not present

## 2021-06-16 DIAGNOSIS — Z8673 Personal history of transient ischemic attack (TIA), and cerebral infarction without residual deficits: Secondary | ICD-10-CM | POA: Diagnosis not present

## 2021-06-16 DIAGNOSIS — Z66 Do not resuscitate: Secondary | ICD-10-CM | POA: Diagnosis present

## 2021-06-16 LAB — ECHOCARDIOGRAM COMPLETE
AR max vel: 3.08 cm2
AV Area VTI: 3.12 cm2
AV Area mean vel: 3.45 cm2
AV Mean grad: 3 mmHg
AV Peak grad: 5.7 mmHg
Ao pk vel: 1.19 m/s
Area-P 1/2: 3.89 cm2
Height: 69 in
P 1/2 time: 674 msec
S' Lateral: 2.8 cm
Weight: 1824 oz

## 2021-06-16 LAB — CBC
HCT: 30.3 % — ABNORMAL LOW (ref 39.0–52.0)
Hemoglobin: 9.5 g/dL — ABNORMAL LOW (ref 13.0–17.0)
MCH: 28.6 pg (ref 26.0–34.0)
MCHC: 31.4 g/dL (ref 30.0–36.0)
MCV: 91.3 fL (ref 80.0–100.0)
Platelets: 183 10*3/uL (ref 150–400)
RBC: 3.32 MIL/uL — ABNORMAL LOW (ref 4.22–5.81)
RDW: 13.7 % (ref 11.5–15.5)
WBC: 8 10*3/uL (ref 4.0–10.5)
nRBC: 0 % (ref 0.0–0.2)

## 2021-06-16 LAB — BASIC METABOLIC PANEL
Anion gap: 5 (ref 5–15)
BUN: 24 mg/dL — ABNORMAL HIGH (ref 8–23)
CO2: 19 mmol/L — ABNORMAL LOW (ref 22–32)
Calcium: 8.6 mg/dL — ABNORMAL LOW (ref 8.9–10.3)
Chloride: 115 mmol/L — ABNORMAL HIGH (ref 98–111)
Creatinine, Ser: 2.28 mg/dL — ABNORMAL HIGH (ref 0.61–1.24)
GFR, Estimated: 28 mL/min — ABNORMAL LOW (ref >60)
Glucose, Bld: 81 mg/dL (ref 70–99)
Potassium: 4.1 mmol/L (ref 3.5–5.1)
Sodium: 139 mmol/L (ref 135–145)

## 2021-06-16 MED ORDER — TIMOLOL MALEATE 0.5 % OP SOLN
1.0000 [drp] | Freq: Two times a day (BID) | OPHTHALMIC | Status: DC
  Administered 2021-06-16 (×2): 1 [drp] via OPHTHALMIC
  Filled 2021-06-16: qty 5

## 2021-06-16 MED ORDER — CEFDINIR 300 MG PO CAPS
300.0000 mg | ORAL_CAPSULE | Freq: Every day | ORAL | Status: DC
  Administered 2021-06-16 – 2021-06-17 (×2): 300 mg via ORAL
  Filled 2021-06-16 (×3): qty 1

## 2021-06-16 MED ORDER — ENSURE ENLIVE PO LIQD
237.0000 mL | Freq: Three times a day (TID) | ORAL | Status: DC
  Administered 2021-06-16 (×2): 237 mL via ORAL

## 2021-06-16 MED ORDER — FAMOTIDINE 20 MG PO TABS
20.0000 mg | ORAL_TABLET | Freq: Every day | ORAL | Status: DC
  Administered 2021-06-17: 20 mg via ORAL
  Filled 2021-06-16: qty 1

## 2021-06-16 MED ORDER — CHLORHEXIDINE GLUCONATE CLOTH 2 % EX PADS
6.0000 | MEDICATED_PAD | Freq: Every day | CUTANEOUS | Status: DC
  Administered 2021-06-16: 6 via TOPICAL

## 2021-06-16 MED ORDER — FINASTERIDE 5 MG PO TABS
5.0000 mg | ORAL_TABLET | Freq: Every day | ORAL | Status: DC
  Administered 2021-06-16 – 2021-06-17 (×2): 5 mg via ORAL
  Filled 2021-06-16 (×2): qty 1

## 2021-06-16 NOTE — Evaluation (Signed)
Occupational Therapy Evaluation and Discharge  Patient Details Name: Keith [name] MRN: [id number] DOB: Aug 19, 1940 Today's Date: 06/16/2021   History of Present Illness 81 y.o. male presents to [facility] ED on 06/15/2021 with possible syncope, fall and head inury. Pt pending admission for management of possible UTI. PMH includes  pAF, colon cancer, SAH, CVA, HLD, CKD stage IV, GERD, IDA and urinary retention with indwelling Foley catheter.   Clinical Impression   Pt is functioning at a supervision level. Recommend ADLs with nursing staff.      Recommendations for follow up therapy are one component of a multi-disciplinary discharge planning process, led by the attending physician.  Recommendations may be updated based on patient status, additional functional criteria and insurance authorization.   Follow Up Recommendations  No OT follow up    Assistance Recommended at Discharge Intermittent Supervision/Assistance  Patient can return home with the following Assistance with cooking/housework;Assist for transportation;Help with stairs or ramp for entrance    Functional Status Assessment  Patient has had a recent decline in their functional status and demonstrates the ability to make significant improvements in function in a reasonable and predictable amount of time.  Equipment Recommendations  None recommended by OT    Recommendations for Other Services       Precautions / Restrictions Precautions Precautions: Fall Precaution Comments: monitor BP      Mobility Bed Mobility Overal bed mobility: Needs Assistance Bed Mobility: Supine to Sit, Sit to Supine     Supine to sit: Supervision Sit to supine: Supervision        Transfers Overall transfer level: Needs assistance Equipment used: None Transfers: Sit to/from Stand Sit to Stand: Supervision                  Balance Overall balance assessment: Needs assistance   Sitting balance-[name] Scale: Good     Standing  balance support: No upper extremity supported, During functional activity Standing balance-[name] Scale: Good                             ADL either performed or assessed with clinical judgement   ADL Overall ADL's : Needs assistance/impaired Eating/Feeding: Independent;Sitting   Grooming: Wash/dry hands;Standing;Supervision/safety   Upper Body Bathing: Set up;Sitting   Lower Body Bathing: Supervison/ safety;Sit to/from stand   Upper Body Dressing : Set up;Sitting   Lower Body Dressing: Supervision/safety;Sit to/from stand   Toilet Transfer: Supervision/safety   Toileting- Water quality scientist and Hygiene: Sit to/from stand;Supervision/safety       Functional mobility during ADLs: Supervision/safety General ADL Comments: pushed IV pole     Vision Baseline Vision/History: 1 Wears glasses Ability to See in Adequate Light: 0 Adequate       Perception     Praxis      Pertinent Vitals/Pain Pain Assessment Pain Assessment: No/denies pain     Hand Dominance Right   Extremity/Trunk Assessment Upper Extremity Assessment Upper Extremity Assessment: Overall WFL for tasks assessed   Lower Extremity Assessment Lower Extremity Assessment: Defer to PT evaluation   Cervical / Trunk Assessment Cervical / Trunk Assessment: Kyphotic   Communication Communication Communication: No difficulties   Cognition Arousal/Alertness: Awake/alert Behavior During Therapy: WFL for tasks assessed/performed Overall Cognitive Status: Within Functional Limits for tasks assessed  General Comments       Exercises     Shoulder Instructions      Home Living Family/patient expects to be discharged to:: Private residence Living Arrangements: Other relatives (granddaughter) Available Help at Discharge: Family;Available 24 hours/day Type of Home: House   Entrance Stairs-Number of Steps: 1+1 Entrance Stairs-Rails:  None Home Layout: One level     Bathroom Shower/Tub: Occupational psychologist: Handicapped height     Home Equipment: Conservation officer, nature (2 wheels);Cane - single point;BSC/3in1;Shower seat          Prior Functioning/Environment Prior Level of Function : Independent/Modified Independent             Mobility Comments: pt reports ambulating without device in the home and with cane outdoors. ADLs Comments: Pt is able to bathe and dress himself, granddaughter assists with household chores and cooking        OT Problem List:        OT Treatment/Interventions:      OT Goals(Current goals can be found in the care plan section)    OT Frequency:      Co-evaluation              AM-PAC OT "6 Clicks" Daily Activity     Outcome Measure Help from another person eating meals?: None Help from another person taking care of personal grooming?: None Help from another person toileting, which includes using toliet, bedpan, or urinal?: None Help from another person bathing (including washing, rinsing, drying)?: None Help from another person to put on and taking off regular upper body clothing?: None Help from another person to put on and taking off regular lower body clothing?: None 6 Click Score: 24   End of Session Equipment Utilized During Treatment: Gait belt  Activity Tolerance: Patient tolerated treatment well Patient left: in bed;with call bell/phone within reach;with bed alarm set  OT Visit Diagnosis: Muscle weakness (generalized) (M62.81)                Time: 2725-3664 OT Time Calculation (min): 21 min Charges:  OT General Charges $OT Visit: 1 Visit OT Evaluation $OT Eval Low Complexity: 1 Low  Nestor Lewandowsky, OTR/L Acute Rehabilitation Services Pager: [phone] Office: [phone]   Malka So 06/16/2021, 12:37 PM

## 2021-06-16 NOTE — TOC Initial Note (Signed)
Transition of Care [facility]) - Initial/Assessment Note    Patient Details  Name: Keith [name] MRN: [id number] Date of Birth: 01-01-1941  [facility] CM/SW Contact:    Tom-Johnson, Renea Ee, RN Phone Number: 06/16/2021, 4:23 PM  Clinical Narrative:                  CM spoke with patient at bedside about needs for post hospital transition. Admitted for Weakness after a fall at home. Has hx of HTN, Urinary Retention, Dysphagia, Paroxysmal A. Fib, CKD4, CVA Anemia, Colon Adenocarcinoma s/p transverse colon resection (08/06/20), stage IIc Scalp Melanoma w/ left neck dissection 2019, BPH w/ hx TURP and bilateral ureteral stents. Patient states his scalp wound would not heal, has a dressing on it.  From home and states his grand daughter lives with him. Has one supportive son. Retired form Arboriculturist. Independent with care and granddaughter assists as needed.  Has a cane, walker, shower seat and grab bars at home.  PCP is Rise Patience, DO and [facility] [address].  PT/OT recommends home health. Patient states he has no preference. Referral sent to [facility] and [place] voiced acceptance. Info on AVS.  Son to transport at discharge. CM will continue to follow with needs.   Expected Discharge Plan: [place] Barriers to Discharge: Continued Medical Work up   Patient Goals and CMS Choice Patient states their goals for this hospitalization and ongoing recovery are:: To return home CMS Medicare.gov Compare Post Acute Care list provided to:: Patient Choice offered to / list presented to : Patient  Expected Discharge Plan and Services Expected Discharge Plan: [place]   Discharge Planning Services: CM Consult Post Acute Care Choice: [place] arrangements for the past 2 months: Single Family Home                 DME Arranged: N/A DME Agency: NA       HH Arranged: PT, OT HH Agency: [facility] Date [place]: 06/16/21 Time HH Agency Contacted: 66 Representative spoke with at [place]: Delsa Sale  Prior Living Arrangements/Services Living arrangements for the past 2 months: [place] with:: Adult Children ([place] daughter) Patient language and need for interpreter reviewed:: Yes Do you feel safe going back to the place where you live?: Yes      Need for Family Participation in Patient Care: Yes (Comment) Care giver support system in place?: Yes (comment) Current home services:  Angela Burke, bedside commode) Criminal Activity/Legal Involvement Pertinent to Current Situation/Hospitalization: No - Comment as needed  Activities of Daily Living Home Assistive Devices/Equipment: None ADL Screening (condition at time of admission) Patient's cognitive ability adequate to safely complete daily activities?: Yes Is the patient deaf or have difficulty hearing?: Yes Does the patient have difficulty seeing, even when wearing glasses/contacts?: No Does the patient have difficulty concentrating, remembering, or making decisions?: No Patient able to express need for assistance with ADLs?: Yes Does the patient have difficulty dressing or bathing?: No Independently performs ADLs?: Yes (appropriate for developmental age) Does the patient have difficulty walking or climbing stairs?: No Weakness of Legs: None Weakness of Arms/Hands: None  Permission Sought/Granted Permission sought to share information with : Case Manager, Customer service manager, Family Supports Permission granted to share information with : Yes, Verbal Permission Granted              Emotional Assessment Appearance:: Appears stated age Attitude/Demeanor/Rapport:  Engaged, Gracious Affect (typically observed): Accepting, Appropriate, Calm, Hopeful Orientation: : Oriented to Self, Oriented to Place, Oriented to  Time, Oriented to Situation Alcohol / Substance Use: Not Applicable Psych Involvement: No  (comment)  Admission diagnosis:  Weakness [R53.1] Urinary tract infection without hematuria, site unspecified [N39.0] Patient Active Problem List   Diagnosis Date Noted   Weakness 06/15/2021   Urinary tract infection without hematuria    Anemia 11/04/2020   Benign esophageal stricture    Acute blood loss anemia 09/22/2020   Difficulty with speech    Orthostatic hypotension 09/05/2020   Internal hernia 08/13/2020   Encounter for assessment for small bowel obstruciton 08/13/2020   Protein-calorie malnutrition, severe 08/11/2020   Cancer of transverse colon ([place]) 08/06/2020   Coronary artery calcification 05/25/2020   Adenocarcinoma ([place]) 05/22/2019   Mass of colon    Symptomatic anemia 05/08/2019   Gastroesophageal reflux disease 02/18/2019   Iron deficiency 09/26/2018   Anemia in stage 4 chronic kidney disease ([place]) 05/21/2018   Vitamin D deficiency 05/21/2018   History of CVA (cerebrovascular accident) 01/31/2018   TSH elevation 01/28/2018   Underweight 01/24/2018   CKD (chronic kidney disease) stage 4, GFR 15-29 ml/min (HCC) 12/10/2017   Hypotension 12/10/2017   Paroxysmal atrial fibrillation ([place]) 11/07/2017   Bilateral carotid artery stenosis 09/03/2017   Malignant melanoma of skin of scalp ([place]) 03/26/2017   Hyperlipidemia 02/08/2017   SAH (subarachnoid hemorrhage) ([place]) 11/20/2016   Cerebrovascular accident (CVA) due to occlusion of left carotid artery ([place]) 11/20/2016   PCP:  Rise Patience, DO Pharmacy:   [facility] [place] [address] Phone: [phone] Fax: [phone]  [facility] - [facility] [place], [place] - [address] [place] [address] [place] [address] Phone: [phone] Fax: [phone]     Social Determinants of Health ([place]) Interventions    Readmission Risk Interventions    08/18/2020   10:41 AM  Readmission Risk Prevention Plan  Transportation Screening Complete   PCP or Specialist Appt within 3-5 Days Complete  HRI or [place] Complete  Social Work Consult for [place] Planning/Counseling Complete  Palliative Care Screening Not Applicable  Medication Review Press photographer) Complete

## 2021-06-16 NOTE — Progress Notes (Addendum)
Family Medicine Teaching Service Daily Progress Note Intern Pager: [phone]  Patient name: Keith [name] Medical record number: [id number] Date of birth: February 09, 1940 Age: 81 y.o. Gender: male  Primary Care Provider: Rise Patience, DO Consultants: None Code Status: DNR  Pt Overview and Major Events to Date:  5/31: Admitted  Assessment and Plan: Keith [name] is a 81 y.o. male who presented after fall and generalized weakness. PMHx significant for HTN, urinary retention, dysphagia, paroxysmal A. Fib, CKD4, CVA and SAH, anemia, hx colon adenocarcinoma s/p transverse colon resection (08/06/20), stage IIc scalp melanoma w/ left neck dissection 2019, BPH w/ hx TURP and b/l ureteral stents.   Syncope  hx frailty Likely multifactorial related to frailty/malnutrition and orthostatic hypotension given positive orthostatics. -PT/OT eval and treat -RD to see this a.m. -Echocardiogram -Discontinue mIVF -Fall precautions  UTI Leukocytosis resolved with most recent WBC 8.0.  Status post IV CTX x1. -Transition abx to Cefdinir '300mg'$  daily (dosing per pharmacy for CKD stage 4), today is day 2/7 -Follow-up urine culture  Urinary retention  BPH w/ hx TURP and b/l ureteral stents Leukocytosis resolved with most recent WBC 8.0.  Still currently has indwelling Foley catheter in place.  Finasteride was held due to hypotension which has improved. -Voiding trial today -Restart finasteride  Hypotension: Improved Blood pressure improved, now 116/55 (MAP 75).  Paroxysmal A. Fib: Stable NSR today. -Continue Eliquis 2.5 mg twice daily -Continue cardiac monitoring  CKD stage 4: Improved Creatinine 2.28, GFR 28.  Improved with fluid bolus and continuous NS. -A.m. BMP  Anemia, likely of chronic dz vs. Bone marrow dysfxn  hx colon CA  Hx repeated blood transfusions Hemoglobin 11.3 (baseline 8-10) on admission, 9.5 this AM likely dilutional effect given decrease in all cell lines. -Continue p.o.  ferrous sulfate 325 mg daily  FEN/GI: Regular PPx: Eliquis Dispo:Pending PT recommendations  pending clinical improvement . Barriers include clinical improvement and ongoing work-up.   Subjective:  Doing well this AM with breakfast tray in front of him.   Objective: Temp:  [98 F (36.7 C)-99 F (37.2 C)] 98.2 F (36.8 C) (06/01 0900) Pulse Rate:  [52-83] 71 (06/01 0900) Resp:  [12-21] 18 (06/01 0900) BP: (109-144)/(44-97) 122/58 (06/01 0900) SpO2:  [95 %-100 %] 96 % (06/01 0900) Physical Exam: General: awake, alert, NAD, large bandage on head Cardiovascular: RRR, no murmurs auscultated Respiratory: CTAB, normal WOB Abdomen: soft, nontender, normoactive bowel sounds  Laboratory: Recent Labs  Lab 06/15/21 0855 06/16/21 0314  WBC 12.2* 8.0  HGB 11.3* 9.5*  HCT 36.4* 30.3*  PLT 260 183   Recent Labs  Lab 06/15/21 0855 06/16/21 0314  NA 137 139  K 4.8 4.1  CL 108 115*  CO2 23 19*  BUN 28* 24*  CREATININE 2.64* 2.28*  CALCIUM 9.2 8.6*  GLUCOSE 141* 81   Imaging/Diagnostic Tests: DG Chest 2 View  Result Date: 06/15/2021 CLINICAL DATA:  Fall today with head injury.  Rule out pneumonia EXAM: CHEST - 2 VIEW COMPARISON:  07/07/2020 FINDINGS: The heart size and mediastinal contours are within normal limits. Cardiac loop recorder noted. Atherosclerotic calcification aortic arch. Both lungs are clear. The visualized skeletal structures are unremarkable. IMPRESSION: No active cardiopulmonary disease. Electronically Signed   By: Franchot Gallo M.D.   On: 06/15/2021 14:13    Wells Guiles, DO 06/16/2021, 11:25 AM PGY-1, [place] Intern pager: [phone], text pages welcome

## 2021-06-16 NOTE — Progress Notes (Signed)
2D Echocardiogram has been performed.  Keith [name] 06/16/2021, 11:34 AM

## 2021-06-16 NOTE — Evaluation (Signed)
Clinical/Bedside Swallow Evaluation Patient Details  Name: Keith [name] MRN: [id number] Date of Birth: 1940/06/07  Today's Date: 06/16/2021 Time: SLP Start Time (ACUTE ONLY): 0815 SLP Stop Time (ACUTE ONLY): 0828 SLP Time Calculation (min) (ACUTE ONLY): 13 min  Past Medical History:  Past Medical History:  Diagnosis Date   Anemia    Cataract    planning surgery   Chronic kidney disease    "weak Kidneys"   Complication of anesthesia    patient expressed that he has difficulty post anesthesia with swallowing, chewing, and talking after surgery   Dysrhythmia    GERD (gastroesophageal reflux disease)    tums prn   Glaucoma    History of hiatal hernia    Hypothyroidism    Hypothyroidism, postsurgical 11/20/2016   Left middle cerebral artery stroke ([place]) 11/23/2016   Melanoma ([place])    scalp   Melanoma of scalp ([place]) 04/23/2017   Obstructive uropathy    PONV (postoperative nausea and vomiting) 03/20/2017   Stroke ([place]) 11/2016   speech and right side at time of stroke 04/20/17- all function return to normal   Underweight 01/24/2018   Past Surgical History:  Past Surgical History:  Procedure Laterality Date   APPLICATION OF A-CELL OF HEAD/NECK N/A 04/23/2017   Procedure: APPLICATION OF INTREGRA;  Surgeon: Irene Limbo, MD;  Location: [place];  Service: Plastics;  Laterality: N/A;   BIOPSY  05/10/2019   Procedure: BIOPSY;  Surgeon: [name], MD;  Location[facility] ENDOSCOPY;  Service: Endoscopy;;   BIOPSY  09/23/2020   Procedure: BIOPSY;  Surgeon: [name], MD;  Location: [facility] ENDOSCOPY;  Service: Endoscopy;;   BIOPSY  09/24/2020   Procedure: BIOPSY;  Surgeon: [name], MD;  Location: [place];  Service: Endoscopy;;   CATARACT EXTRACTION     COLONOSCOPY WITH PROPOFOL N/A 05/10/2019   Procedure: COLONOSCOPY WITH PROPOFOL;  Surgeon: [name], MD;  Location: [facility] ENDOSCOPY;  Service: Endoscopy;  Laterality: N/A;   COLONOSCOPY WITH PROPOFOL N/A 09/24/2020   Procedure:  COLONOSCOPY WITH PROPOFOL;  Surgeon: [name], MD;  Location: [facility] ENDOSCOPY;  Service: Endoscopy;  Laterality: N/A;   ESOPHAGOGASTRODUODENOSCOPY (EGD) WITH PROPOFOL N/A 05/10/2019   Procedure: ESOPHAGOGASTRODUODENOSCOPY (EGD) WITH PROPOFOL;  Surgeon: [name], MD;  Location: [facility] ENDOSCOPY;  Service: Endoscopy;  Laterality: N/A;   ESOPHAGOGASTRODUODENOSCOPY (EGD) WITH PROPOFOL N/A 09/23/2020   Procedure: ESOPHAGOGASTRODUODENOSCOPY (EGD) WITH PROPOFOL;  Surgeon: [name], MD;  Location: [facility] ENDOSCOPY;  Service: Endoscopy;  Laterality: N/A;   EXCISION MASS HEAD N/A 03/20/2017   Procedure: EXCISION scalp lesion;  Surgeon: Irene Limbo, MD;  Location: [place];  Service: Plastics;  Laterality: N/A;  EXCISION scalp lesion   EYE SURGERY     IR ANGIO INTRA EXTRACRAN SEL COM CAROTID INNOMINATE BILAT MOD SED  11/23/2016   IR ANGIO VERTEBRAL SEL VERTEBRAL UNI L MOD SED  11/23/2016   LOOP RECORDER INSERTION N/A 11/23/2016   Procedure: LOOP RECORDER INSERTION;  Surgeon: [name], MD;  Location: [place] CV LAB;  Service: Cardiovascular;  Laterality: N/A;   MASS EXCISION N/A 04/23/2017   Procedure: EXCISION MALIGNANT LESION OF SCALP;  Surgeon: Irene Limbo, MD;  Location: [place];  Service: Plastics;  Laterality: N/A;   MELANOMA EXCISION  04/23/2017   BILATERAL SUPERFICIAL PAROTIDECTOMY/NECK SENTINEL NODE BIOPSY WITH FROZEN SECTION; LEFT SELECTIVE NECK DISSECTION   PAROTIDECTOMY Bilateral 04/23/2017   Procedure: BILATERAL SUPERFICIAL PAROTIDECTOMY/NECK SENTINEL NODE BIOPSY WITH FROZEN SECTION; LEFT SELECTIVE NECK DISSECTION;  Surgeon: [name], MD;  Location: [place];  Service: ENT;  Laterality: Bilateral;   POLYPECTOMY  05/10/2019   Procedure: POLYPECTOMY;  Surgeon: [name], MD;  Location: [facility] ENDOSCOPY;  Service: Endoscopy;;   POLYPECTOMY  09/24/2020   Procedure: POLYPECTOMY;  Surgeon: [name], MD;  Location: [place];  Service: Endoscopy;;   SKIN GRAFT SPLIT THICKNESS HEAD /  NECK  06/04/2017   SKIN GRAFT SPLIT THICKNESS TO SCALP FROM RIGHT OR LEFT THIGH POSSIBLE A CELL TO DONOR SITE   SKIN SPLIT GRAFT N/A 06/04/2017   Procedure: SKIN GRAFT SPLIT THICKNESS TO SCALP FROM RIGHT OR LEFT THIGH POSSIBLE A CELL TO DONOR SITE;  Surgeon: Irene Limbo, MD;  Location: [place];  Service: Plastics;  Laterality: N/A;   SUBMUCOSAL TATTOO INJECTION  05/10/2019   Procedure: SUBMUCOSAL TATTOO INJECTION;  Surgeon: [name], MD;  Location: [facility] ENDOSCOPY;  Service: Endoscopy;;   THYROIDECTOMY     TONSILLECTOMY     HPI:     Illness   81 y.o. male presents to [facility] ED on 06/15/2021 with possible syncope, fall and head inury. Pt pending admission for management of possible UTI. PMH includes  pAF, colon cancer, SAH, CVA, HLD, CKD stage IV, GERD, IDA and urinary retention with indwelling Foley catheter.     Assessment / Plan / Recommendation  Clinical Impression  Patient known to SLP from prior visit - - as an OP where mild oral and moderately severe pharyngeal dysphagia noted with trace silent penetration/aspiration.   Pt had retention in pharynx during MBS- He reports use of compensation strategies helpful to decrease his "choking".  Patient continues with left facial asymmetry - has h/o left frontal CVA.  He demonstrates adequate cough, voice and swallow function based on clinical swallow evaluation. No indication of aspiration or penetration during intake.  Did not conduct 3 ounce [place] water screen due to pt's known h/o aspiration- Reviewed swallow precautions and video images of MBS with pt using teach back.  Pt able to reiterate her precautions without cues.  Recommended pt continue to strengthen his "cough" and "expectoration". He reports he has had to cough up a pill 3 times since his dysphagia stated last summer (2022).  Pt is managing his chronic dysphagia. No SLP follow up indicated. SLP Visit Diagnosis: Dysphagia, oropharyngeal phase (R13.12)    Aspiration Risk  Moderate  aspiration risk    Diet Recommendation Thin liquid;Regular   Liquid Administration via: Cup;Straw Medication Administration: Other (Comment) (defer to pt) Supervision: Patient able to self feed;Staff to assist with self feeding Compensations: Slow rate;Small sips/bites;Follow solids with liquid Postural Changes: Seated upright at 90 degrees;Remain upright for at least 30 minutes after po intake    Other  Recommendations Oral Care Recommendations: Oral care BID    Recommendations for follow up therapy are one component of a multi-disciplinary discharge planning process, led by the attending physician.  Recommendations may be updated based on patient status, additional functional criteria and insurance authorization.  Follow up Recommendations No SLP follow up      Assistance Recommended at Discharge None  Functional Status Assessment Patient has not had a recent decline in their functional status  Frequency and Duration   N/a         Prognosis    N/a    Swallow Study   General Date of Onset: 06/16/21 Type of Study: Bedside Swallow Evaluation Temperature Spikes Noted: No Respiratory Status: Room air History of Recent Intubation: No Behavior/Cognition: Alert;Cooperative;Pleasant mood Oral Cavity Assessment: Within Functional Limits Oral Care Completed by SLP: No  Oral Cavity - Dentition: Dentures, top;Dentures, bottom Vision: Functional for self-feeding Self-Feeding Abilities: Able to feed self Patient Positioning: Upright in bed Baseline Vocal Quality: Low vocal intensity Volitional Cough: Strong Volitional Swallow: Able to elicit    Oral/Motor/Sensory Function Overall Oral Motor/Sensory Function: Mild impairment (left facial asymmetry, palatal elevation deviated slightly - pulling left)   Ice Chips Ice chips: Not tested   Thin Liquid Thin Liquid: Within functional limits Presentation: Cup;Straw    Nectar Thick Nectar Thick Liquid: Not tested   Honey Thick Honey Thick  Liquid: Not tested   Puree Puree: Not tested   Solid     Solid: Within functional limits Presentation: Self Fed Other Comments: pt uses liquids to aid clearance of pharynx      Macario Golds 06/16/2021,8:45 AM  Kathleen Lime, MS [place] Office [phone] Pager [phone]

## 2021-06-16 NOTE — Progress Notes (Signed)
Initial Nutrition Assessment  DOCUMENTATION CODES:   Non-severe (moderate) malnutrition in context of chronic illness, Underweight  INTERVENTION:   Continue Regular diet  Ensure Enlive po TID, each supplement provides 350 kcal and 20 grams of protein.    NUTRITION DIAGNOSIS:   Moderate Malnutrition related to chronic illness as evidenced by moderate muscle depletion, severe fat depletion, moderate fat depletion.  GOAL:   Patient will meet greater than or equal to 90% of their needs  MONITOR:   PO intake, Supplement acceptance, Labs, Weight trends  REASON FOR ASSESSMENT:   Consult Assessment of nutrition requirement/status  ASSESSMENT:   81 yo admitted with syncope, fall with generalized weakness. PMH stage II scalp melanoma with left neck dissection in 2019, HTN, CKD IV, CVA, SAH, anemia, hx of colon adenocarcinoma s/p transverse colon resection  Recorded po intake 70-100% of meals. Pt reports good appetite currently and PTA.   Pt lives at home with his granddaughter (56 yo). He reports he does most of the cooking (his mother taught him how to cook and he also worked [employer] as a Training and development officer). Pt reports he usually gets breakfast of coffee with buttered toast or waffles with sausage. Pt reports he nibbles all day long after that. Pt reports he has Ensure at home and does not drink every day, just when he needs them.   Pt reports hx of dysphagia but reports he is able to tolerate most foods as long as he chews well. Pt avoids things like rice that he does not tolerate well.   Pt reports UBW around 114-116 pounds; current wt 114 pounds. Per weight encounters, no recent wt loss  Pt with bandages on his scalp; pt reports he has wounds there from where he had spots removed due to melanoma. They will heal but then pop back open.   Labs: reviewed Meds: ferrous sulfate   NUTRITION - FOCUSED PHYSICAL EXAM:  Flowsheet Row Most Recent Value  Orbital Region Severe depletion   Upper Arm Region Mild depletion  Thoracic and Lumbar Region Severe depletion  Buccal Region Moderate depletion  Temple Region Severe depletion  Clavicle Bone Region Moderate depletion  Clavicle and Acromion Bone Region Moderate depletion  Scapular Bone Region Moderate depletion  Dorsal Hand Moderate depletion  Patellar Region Moderate depletion  Anterior Thigh Region Moderate depletion  Posterior Calf Region Moderate depletion       Diet Order:   Diet Order             Diet regular Room service appropriate? Yes; Fluid consistency: Thin  Diet effective now                   EDUCATION NEEDS:   Education needs have been addressed  Skin:  Skin Assessment: Reviewed RN Assessment  Last BM:  no documented BM  Height:   Ht Readings from Last 1 Encounters:  06/15/21 '5\' 9"'$  (1.753 m)    Weight:   Wt Readings from Last 1 Encounters:  06/15/21 51.7 kg    BMI:  Body mass index is 16.83 kg/m.  Estimated Nutritional Needs:   Kcal:  1600-1800 kcals  Protein:  78-104 g  Fluid:  >/= 1.6 L   Kerman Passey MS, RDN, LDN, CNSC Registered Dietitian III Clinical Nutrition RD Pager and On-Call Pager Number Located in [place]

## 2021-06-17 ENCOUNTER — Other Ambulatory Visit (HOSPITAL_COMMUNITY): Payer: Self-pay

## 2021-06-17 DIAGNOSIS — R55 Syncope and collapse: Secondary | ICD-10-CM

## 2021-06-17 LAB — URINE CULTURE: Culture: >=100000 — AB

## 2021-06-17 LAB — BASIC METABOLIC PANEL
Anion gap: 5 (ref 5–15)
BUN: 14 mg/dL (ref 8–23)
CO2: 19 mmol/L — ABNORMAL LOW (ref 22–32)
Calcium: 8.2 mg/dL — ABNORMAL LOW (ref 8.9–10.3)
Chloride: 115 mmol/L — ABNORMAL HIGH (ref 98–111)
Creatinine, Ser: 2.24 mg/dL — ABNORMAL HIGH (ref 0.61–1.24)
GFR, Estimated: 29 mL/min — ABNORMAL LOW (ref >60)
Glucose, Bld: 86 mg/dL (ref 70–99)
Potassium: 4.2 mmol/L (ref 3.5–5.1)
Sodium: 139 mmol/L (ref 135–145)

## 2021-06-17 MED ORDER — CEFADROXIL 500 MG PO CAPS: 500.0000 mg | ORAL_CAPSULE | Freq: Every day | ORAL | Status: DC

## 2021-06-17 MED ORDER — FAMOTIDINE 20 MG PO TABS: 20.0000 mg | ORAL_TABLET | Freq: Every day | ORAL | Status: DC

## 2021-06-17 MED ORDER — CEFADROXIL 500 MG PO CAPS
500.0000 mg | ORAL_CAPSULE | Freq: Two times a day (BID) | ORAL | Status: DC
Start: 2021-06-17 — End: 2021-06-17

## 2021-06-17 MED ORDER — CEFADROXIL 500 MG PO CAPS
500.0000 mg | ORAL_CAPSULE | Freq: Every day | ORAL | 0 refills | Status: AC
  Filled 2021-06-17: qty 5, 5d supply, fill #0

## 2021-06-17 NOTE — Progress Notes (Signed)
CALL PAGER [phone] for any questions or notifications regarding this patient  FMTS Attending Note: Dorcas Mcmurray MD Clarification: Patient has moderate malnutrition.

## 2021-06-17 NOTE — Discharge Summary (Signed)
[facility] Discharge Summary  Patient name: Keith [name] Medical record number: [id number] Date of birth: [birth year] Age: 81 y.o. Gender: male Date of Admission: 06/15/2021  Date of Discharge: 06/17/2021 Admitting Physician: Leeanne Rio, MD  Primary Care Provider: Rise Patience, DO Consultants: None  Indication for Hospitalization: Fall and generalized weakness  Discharge Diagnoses/Problem List:  Principal Problem:   Weakness  Disposition: Home health PT  Discharge Condition: Stable  Discharge Exam:  Blood pressure (!) 148/85, pulse 62, temperature 98.2 F (36.8 C), temperature source Oral, resp. rate 18, height '5\' 9"'$  (1.753 m), weight 51.7 kg, SpO2 100 %.  General: awake, alert, NAD, pleasant, large bandage on head CV: RRR, no murmurs auscultated Respiratory: CTAB, normal WOB Abdomen: soft, nontender, normoactive BS  Brief Hospital Course:  Keith [name] is a 81 y.o.male with a history of paroxysmal A-fib on Eliquis, colon cancer s/p resection, CVA, HLD, CKD stage IV, urinary retention who was admitted to the [facility] Teaching Service at [facility] for fall and found to have UTI. His hospital course is detailed below:  Fall Pt presented after fall at home with head trauma, LOC. CT head negative for acute abnormality. CXR without . Likely secondary to dehydration, UTI and progressive frailty/weakness. PT recommended home PT.   UTI  Urinary retention UA positive for infection. He was started on CTX before transitioning to PO Cefdinir and then Cefadroxil for a total of 7 days antibiotics. Required foley catheter placement for urinary retention. Advised patient to follow-up with Urology.   Hypotension Resolved with IVF. Home Finasteride was held. Advised to restart Midodrine as he was not taking it at home for concern of elevated blood pressures.   Other chronic conditions were medically managed with home medications and formulary  alternatives as necessary (anemia, paroxysmal A-fib)  PCP Follow-up Recommendations: Ensure taking Midodrine Needs f/u with Urology   Significant Procedures: None  Significant Labs and Imaging:  Recent Labs  Lab 06/15/21 0855 06/16/21 0314  WBC 12.2* 8.0  HGB 11.3* 9.5*  HCT 36.4* 30.3*  PLT 260 183   Recent Labs  Lab 06/15/21 0855 06/16/21 0314 06/17/21 0318  NA 137 139 139  K 4.8 4.1 4.2  CL 108 115* 115*  CO2 23 19* 19*  GLUCOSE 141* 81 86  BUN 28* 24* 14  CREATININE 2.64* 2.28* 2.24*  CALCIUM 9.2 8.6* 8.2*   Results/Tests Pending at Time of Discharge:   Discharge Medications:  Allergies as of 06/17/2021       Reactions   Penicillins Other (See Comments)   Has patient had a PCN reaction causing immediate rash, facial/tongue/throat swelling, SOB or lightheadedness with hypotension: Unknown Has patient had a PCN reaction causing severe rash involving mucus membranes or skin necrosis: Unknown Has patient had a PCN reaction that required hospitalization: Unknown Has patient had a PCN reaction occurring within the last 10 years: No If all of the above answers are "NO", then may proceed with Cephalosporin use. Tolerated cephlasporins        Medication List     TAKE these medications    Acetaminophen Extra Strength 500 MG tablet Generic drug: acetaminophen NEW PRESCRIPTION REQUEST: TAKE ONE TABLET BY MOUTH EVERY 6 HOURS AS NEEDED   atorvastatin 40 MG tablet Commonly known as: LIPITOR NEW PRESCRIPTION REQUEST: TAKE ONE TABLET BY MOUTH EVERY MORNING   cefadroxil 500 MG capsule Commonly known as: DURICEF Take 1 capsule (500 mg total) by mouth daily for 5 days. Start taking on:  June 18, 2021   cholecalciferol 25 MCG (1000 UNIT) tablet Commonly known as: VITAMIN D NEW PRESCRIPTION REQUEST: TAKE ONE TABLET BY MOUTH EVERY MORNING   Dapsone 5 % topical gel Apply 1 application. topically 2 (two) times a week.   Eliquis 2.5 MG Tabs tablet Generic drug:  apixaban TAKE 1 TABLET BY MOUTH TWICE A DAY   Ensure Plus Liqd Take 237 mLs by mouth 2 (two) times daily between meals. Choc or Vanilla   famotidine 20 MG tablet Commonly known as: PEPCID Take 1 tablet (20 mg total) by mouth daily. What changed: when to take this   finasteride 5 MG tablet Commonly known as: PROSCAR Take 5 mg by mouth daily.   IRON PO Take 1 tablet by mouth daily.   loperamide 2 MG capsule Commonly known as: IMODIUM Take by mouth as needed for diarrhea or loose stools.   midodrine 2.5 MG tablet Commonly known as: PROAMATINE TAKE 1 TABLET (2.5 MG TOTAL) BY MOUTH 2 (TWO) TIMES DAILY WITH A MEAL.   multivitamin capsule Take 1 capsule by mouth daily.   timolol 0.5 % ophthalmic solution Commonly known as: TIMOPTIC NEW PRESCRIPTION REQUEST: INSTILL ONE DROP IN [facility] EYE IN THE MORNING AND IN THE EVENING       Discharge Instructions: Please refer to Patient Instructions section of EMR for full details.  Patient was counseled important signs and symptoms that should prompt return to medical care, changes in medications, dietary instructions, activity restrictions, and follow up appointments.   Follow-Up Appointments: Future Appointments  Date Time Provider [place]  06/24/2021  9:30 AM Rise Patience, DO [facility] [place]  06/28/2021 10:00 AM [facility] None  07/05/2021  3:00 PM [name], MD [facility] None  07/07/2021  1:00 PM Gayland Curry, DO [facility] None    Wells Guiles, DO 06/17/2021, 3:26 PM PGY-1, [place]

## 2021-06-17 NOTE — Progress Notes (Signed)
PT AVS reviewed and pt verbalized understanding of all DC teaching and instructions. Pt will be going home with family. IV removed. Pt going home with [facility] and will follow up with urology.

## 2021-06-17 NOTE — Progress Notes (Signed)
Physical Therapy Treatment Patient Details Name: Keith [name] MRN: [id number] DOB: Feb 07, 1940 Today's Date: 06/17/2021   History of Present Illness 81 y.o. male presents to [facility] ED on 06/15/2021 with possible syncope, fall and head inury. Pt pending admission for management of possible UTI. PMH includes  pAF, colon cancer, SAH, CVA, HLD, CKD stage IV, GERD, IDA and urinary retention with indwelling Foley catheter.    PT Comments    Patient progressing towards physical therapy goals. Patient ambulating 350' with supervision and no AD, however did have LOB x 1 with 180 degree turn. Educated patient on taking his time with turns due to balance deficits. Able to perform sit to stand x 10 from low bed surface with hands on knees. D/c plan remains appropriate.     Recommendations for follow up therapy are one component of a multi-disciplinary discharge planning process, led by the attending physician.  Recommendations may be updated based on patient status, additional functional criteria and insurance authorization.  Follow Up Recommendations  Home health PT     Assistance Recommended at Discharge Intermittent Supervision/Assistance  Patient can return home with the following A Knauer help with walking and/or transfers;A Rochette help with bathing/dressing/bathroom;Assistance with cooking/housework;Assist for transportation;Help with stairs or ramp for entrance   Equipment Recommendations  None recommended by PT    Recommendations for Other Services       Precautions / Restrictions Precautions Precautions: Fall Restrictions Weight Bearing Restrictions: No     Mobility  Bed Mobility Overal bed mobility: Modified Independent                  Transfers Overall transfer level: Needs assistance Equipment used: None Transfers: Sit to/from Stand Sit to Stand: Supervision           General transfer comment: supervision for safety    Ambulation/Gait Ambulation/Gait  assistance: Supervision Gait Distance (Feet): 350 Feet Assistive device: None Gait Pattern/deviations: Step-through pattern Gait velocity: reduced     General Gait Details: LOB x 1 when turning 180 degrees otherwise supervision for mobility   Stairs             Wheelchair Mobility    Modified Rankin (Stroke Patients Only)       Balance Overall balance assessment: Mild deficits observed, not formally tested                                          Cognition Arousal/Alertness: Awake/alert Behavior During Therapy: WFL for tasks assessed/performed Overall Cognitive Status: Within Functional Limits for tasks assessed                                          Exercises Other Exercises Other Exercises: sit to stand x 10    General Comments        Pertinent Vitals/Pain Pain Assessment Pain Assessment: No/denies pain    Home Living                          Prior Function            PT Goals (current goals can now be found in the care plan section) Acute Rehab PT Goals Patient Stated Goal: to regain strength and return to prior level of function PT Goal  Formulation: With patient Time For Goal Achievement: 06/29/21 Potential to Achieve Goals: Good Progress towards PT goals: Progressing toward goals    Frequency    Min 3X/week      PT Plan Current plan remains appropriate    Co-evaluation              AM-PAC PT "6 Clicks" Mobility   Outcome Measure  Help needed turning from your back to your side while in a flat bed without using bedrails?: None Help needed moving from lying on your back to sitting on the side of a flat bed without using bedrails?: None Help needed moving to and from a bed to a chair (including a wheelchair)?: A Castner Help needed standing up from a chair using your arms (e.g., wheelchair or bedside chair)?: A Burdo Help needed to walk in hospital room?: A [place] Help needed  climbing 3-5 steps with a railing? : A Strathman 6 Click Score: 20    End of Session   Activity Tolerance: Patient tolerated treatment well Patient left: in bed;with call bell/phone within reach Nurse Communication: Mobility status PT Visit Diagnosis: Other abnormalities of gait and mobility (R26.89);Muscle weakness (generalized) (M62.81)     Time: 9935-7017 PT Time Calculation (min) (ACUTE ONLY): 24 min  Charges:  $Gait Training: 8-22 mins $Therapeutic Exercise: 8-22 mins                     Vandy Tsuchiya A. Gilford Rile PT, DPT Acute Rehabilitation Services Pager [phone] Office [phone]    Linna Hoff 06/17/2021, 4:59 PM

## 2021-06-17 NOTE — Progress Notes (Addendum)
FPTS Brief Progress Note  S:Patient awake and laying comfortably in bed. Denies any complain and expressed gratitude to the entire medical team   O: BP (!) 125/49 (BP Location: Right Arm)   Pulse 62   Temp 98.7 F (37.1 C) (Oral)   Resp 18   Ht '5\' 9"'$  (1.753 m)   Wt 51.7 kg   SpO2 97%   BMI 16.83 kg/m    General: Awake, Pleasant well, NAD HEENT: Atraumatic, MMM, No sclera icterus CV: RRR, no murmurs, normal S1/S2 Pulm: CTAB, good WOB on RA, no crackles or wheezing  A/P: -Foley to be out tomorrow AM for void trial - Orders reviewed. Labs for AM ordered, which was adjusted as needed.  - Plan per day team   Alen Bleacher, MD 06/17/2021, 12:05 AM PGY-1, [facility] Night Resident  Please page [phone] with questions.

## 2021-06-20 ENCOUNTER — Telehealth: Payer: Self-pay

## 2021-06-20 DIAGNOSIS — Z85820 Personal history of malignant melanoma of skin: Secondary | ICD-10-CM | POA: Diagnosis not present

## 2021-06-20 DIAGNOSIS — L929 Granulomatous disorder of the skin and subcutaneous tissue, unspecified: Secondary | ICD-10-CM | POA: Diagnosis not present

## 2021-06-20 DIAGNOSIS — D485 Neoplasm of uncertain behavior of skin: Secondary | ICD-10-CM | POA: Diagnosis not present

## 2021-06-20 DIAGNOSIS — L131 Subcorneal pustular dermatitis: Secondary | ICD-10-CM | POA: Diagnosis not present

## 2021-06-20 DIAGNOSIS — L570 Actinic keratosis: Secondary | ICD-10-CM | POA: Diagnosis not present

## 2021-06-20 NOTE — Telephone Encounter (Signed)
Transition Care Management Follow-up Telephone Call Date of discharge and from where: 06/19/21 [facility] How have you been since you were released from the hospital? He feels ok but  a [place] weak and tired.  He is sleeping and eating good. Any questions or concerns? No  Items Reviewed: Did the pt receive and understand the discharge instructions provided? Yes  Medications obtained and verified? Yes  Other? No  Any new allergies since your discharge? No  Dietary orders reviewed? Yes Do you have support at home? Yes Granddaughter lives with him  Home Care and Equipment/Supplies: Were home health services ordered? yes If so, what is the name of the agency? Well [place] He said they usually call in a couple of days and he has the number on the discharge summary Has the agency set up a time to come to the patient's home? no Were any new equipment or medical supplies ordered?  No What is the name of the medical supply agency? N/a Were you able to get the supplies/equipment? not applicable Do you have any questions related to the use of the equipment or supplies? No  Functional Questionnaire: (I = Independent and D = Dependent) ADLs: I  Bathing/Dressing- I  Meal Prep- I  Eating- I  Maintaining continence- I  Transferring/Ambulation- I  Managing Meds- I  Follow up appointments reviewed:  PCP Hospital f/u appt confirmed? Yes  Scheduled to see Dr.[name] on 06/24/21 @ 930 am. [facility] f/u appt confirmed? Yes  Scheduled to see 07/05/21 on Dr. Alen Blew @ 3 pm. Are transportation arrangements needed? No  If their condition worsens, is the pt aware to call PCP or go to the Emergency Dept.? Yes Was the patient provided with contact information for the PCP's office or ED? Yes Was to pt encouraged to call back with questions or concerns? Yes   Lazaro Arms RN, BSN, [facility] Care Management Coordinator Transition of Care  Phone: [phone]

## 2021-06-21 ENCOUNTER — Encounter: Payer: Self-pay | Admitting: *Deleted

## 2021-06-23 ENCOUNTER — Other Ambulatory Visit: Payer: Self-pay

## 2021-06-23 DIAGNOSIS — I6523 Occlusion and stenosis of bilateral carotid arteries: Secondary | ICD-10-CM | POA: Diagnosis not present

## 2021-06-23 DIAGNOSIS — Z85828 Personal history of other malignant neoplasm of skin: Secondary | ICD-10-CM | POA: Diagnosis not present

## 2021-06-23 DIAGNOSIS — R339 Retention of urine, unspecified: Secondary | ICD-10-CM | POA: Diagnosis not present

## 2021-06-23 DIAGNOSIS — I480 Paroxysmal atrial fibrillation: Secondary | ICD-10-CM | POA: Diagnosis not present

## 2021-06-23 DIAGNOSIS — Z85038 Personal history of other malignant neoplasm of large intestine: Secondary | ICD-10-CM | POA: Diagnosis not present

## 2021-06-23 DIAGNOSIS — K219 Gastro-esophageal reflux disease without esophagitis: Secondary | ICD-10-CM | POA: Diagnosis not present

## 2021-06-23 DIAGNOSIS — I129 Hypertensive chronic kidney disease with stage 1 through stage 4 chronic kidney disease, or unspecified chronic kidney disease: Secondary | ICD-10-CM | POA: Diagnosis not present

## 2021-06-23 DIAGNOSIS — I951 Orthostatic hypotension: Secondary | ICD-10-CM | POA: Diagnosis not present

## 2021-06-23 DIAGNOSIS — D631 Anemia in chronic kidney disease: Secondary | ICD-10-CM | POA: Diagnosis not present

## 2021-06-23 DIAGNOSIS — R1310 Dysphagia, unspecified: Secondary | ICD-10-CM | POA: Diagnosis not present

## 2021-06-23 DIAGNOSIS — N184 Chronic kidney disease, stage 4 (severe): Secondary | ICD-10-CM | POA: Diagnosis not present

## 2021-06-23 DIAGNOSIS — Z7901 Long term (current) use of anticoagulants: Secondary | ICD-10-CM | POA: Diagnosis not present

## 2021-06-23 DIAGNOSIS — Z87891 Personal history of nicotine dependence: Secondary | ICD-10-CM | POA: Diagnosis not present

## 2021-06-23 DIAGNOSIS — Z9049 Acquired absence of other specified parts of digestive tract: Secondary | ICD-10-CM | POA: Diagnosis not present

## 2021-06-23 DIAGNOSIS — Z87440 Personal history of urinary (tract) infections: Secondary | ICD-10-CM | POA: Diagnosis not present

## 2021-06-23 DIAGNOSIS — E785 Hyperlipidemia, unspecified: Secondary | ICD-10-CM | POA: Diagnosis not present

## 2021-06-23 DIAGNOSIS — E43 Unspecified severe protein-calorie malnutrition: Secondary | ICD-10-CM | POA: Diagnosis not present

## 2021-06-23 DIAGNOSIS — Z8673 Personal history of transient ischemic attack (TIA), and cerebral infarction without residual deficits: Secondary | ICD-10-CM | POA: Diagnosis not present

## 2021-06-23 DIAGNOSIS — Z9181 History of falling: Secondary | ICD-10-CM | POA: Diagnosis not present

## 2021-06-23 DIAGNOSIS — L309 Dermatitis, unspecified: Secondary | ICD-10-CM | POA: Diagnosis not present

## 2021-06-23 DIAGNOSIS — E039 Hypothyroidism, unspecified: Secondary | ICD-10-CM | POA: Diagnosis not present

## 2021-06-23 DIAGNOSIS — Z960 Presence of urogenital implants: Secondary | ICD-10-CM | POA: Diagnosis not present

## 2021-06-23 MED ORDER — MIDODRINE HCL 2.5 MG PO TABS: 2.5000 mg | ORAL_TABLET | Freq: Two times a day (BID) | ORAL | 1 refills | Status: DC

## 2021-06-24 ENCOUNTER — Encounter: Payer: Self-pay | Admitting: Family Medicine

## 2021-06-24 ENCOUNTER — Ambulatory Visit (INDEPENDENT_AMBULATORY_CARE_PROVIDER_SITE_OTHER): Payer: Medicare Other | Admitting: Family Medicine

## 2021-06-24 VITALS — BP 110/58 | HR 48 | Wt 112.0 lb

## 2021-06-24 DIAGNOSIS — Z09 Encounter for follow-up examination after completed treatment for conditions other than malignant neoplasm: Secondary | ICD-10-CM

## 2021-06-24 DIAGNOSIS — Z978 Presence of other specified devices: Secondary | ICD-10-CM

## 2021-06-24 DIAGNOSIS — I951 Orthostatic hypotension: Secondary | ICD-10-CM

## 2021-06-24 DIAGNOSIS — R531 Weakness: Secondary | ICD-10-CM | POA: Diagnosis not present

## 2021-06-24 NOTE — Progress Notes (Signed)
Follow up outpatient visit after Hospitalization  Keith [name] is accompanied by daughter-in-law Sources of clinical information for visit is/are patient, caregiver / friend, and past medical records. The Discharge Summary for the hospitalization from 06/15/2021 to 06/17/2021 was reviewed.  Nursing assessment for this office visit was reviewed with the patient for accuracy and revision.   HPI Principle Diagnosis requiring hospitalization: Fall with generalized weakness   Brief Hospital course summary:  Keith [name] is a 81 y.o.male with a history of paroxysmal A-fib on Eliquis, colon cancer s/p resection, CVA, HLD, CKD stage IV, urinary retention who was admitted to the [facility] Teaching Service at [facility] for fall and found to have UTI. His hospital course is detailed below:  Fall Pt presented after fall at home with head trauma, LOC. CT head negative for acute abnormality. CXR without . Likely secondary to dehydration, UTI and progressive frailty/weakness. PT recommended home PT.  UTI  Urinary retention UA positive for infection. He was started on CTX before transitioning to PO Cefdinir and then Cefadroxil for a total of 7 days antibiotics. Required foley catheter placement for urinary retention. Advised patient to follow-up with Urology.  Hypotension Resolved with IVF. Home Finasteride was held. Advised to restart Midodrine as he was not taking it at home for concern of elevated blood pressures.     Follow up instructions from patient's hospital healthcare providers:  Ensure taking Midodrine Needs f/u with Urology ---------------------------------------------------------------------------------------------------------------------- Problems since hospital discharge: improved but continued weakness and dizziness with standing This has been present for several years. Patient is currently working with physical therapy to ensure safety during movements and body position changes. He has  not had any further falls or worsening symptoms since his discharge.   Physical exam Gen: overall well-appearing, NAD, cachetic elderly male  CV: RRR, no m/r/g appreciated, no peripheral edema Pulm: CTAB, no wheezes/crackles  ---------------------------------------------------------------------------------------------------------------------- Follow up appointments with specialists:  Pending: Urology ---------------------------------------------------------------------------------------------------------------------- New medications started during hospitalization: None but encouraged to continue Midodrine Chronic medications stopped during hospitalization: None Patient's Medication List was updated in the EMR: yes --------------------------------------------------------------------------------------------------------------------- Home Health Services: PT at home about twice per week, palliative care Durable Medical Equipment: cane, rollator --------------------------------------------------------------------------------------------------------------------- ADLs Independent Needs Assistance Dependent  Bathing X    Dressing X    Ambulation  X   Toileting X    Eating X     IADL [place] X    Housework  X   Manage Medications  X   Manage the telephone X    Shopping for food, clothes, Meds, etc   X  Use transportation   X  Manage Finances  X      Moet Mikulski, DO

## 2021-06-24 NOTE — Patient Instructions (Signed)
Make sure to keep an eye on doing slower movements especially when turning or when getting up.  Your blood pressure dropping during those movements is probably what is making you dizzy and can cause falls slowly want to be very careful.  Make sure to follow-up with urology and we will plan to follow-up in about 3 months or so unless you are needing to follow-up sooner.

## 2021-06-28 ENCOUNTER — Other Ambulatory Visit: Payer: Medicare Other

## 2021-06-28 DIAGNOSIS — I480 Paroxysmal atrial fibrillation: Secondary | ICD-10-CM | POA: Diagnosis not present

## 2021-06-28 DIAGNOSIS — I129 Hypertensive chronic kidney disease with stage 1 through stage 4 chronic kidney disease, or unspecified chronic kidney disease: Secondary | ICD-10-CM | POA: Diagnosis not present

## 2021-06-28 DIAGNOSIS — R339 Retention of urine, unspecified: Secondary | ICD-10-CM | POA: Diagnosis not present

## 2021-06-28 DIAGNOSIS — I6523 Occlusion and stenosis of bilateral carotid arteries: Secondary | ICD-10-CM | POA: Diagnosis not present

## 2021-06-28 DIAGNOSIS — Z8673 Personal history of transient ischemic attack (TIA), and cerebral infarction without residual deficits: Secondary | ICD-10-CM | POA: Diagnosis not present

## 2021-06-28 DIAGNOSIS — Z9049 Acquired absence of other specified parts of digestive tract: Secondary | ICD-10-CM | POA: Diagnosis not present

## 2021-06-28 DIAGNOSIS — Z960 Presence of urogenital implants: Secondary | ICD-10-CM | POA: Diagnosis not present

## 2021-06-28 DIAGNOSIS — L309 Dermatitis, unspecified: Secondary | ICD-10-CM | POA: Diagnosis not present

## 2021-06-28 DIAGNOSIS — D631 Anemia in chronic kidney disease: Secondary | ICD-10-CM | POA: Diagnosis not present

## 2021-06-28 DIAGNOSIS — Z9181 History of falling: Secondary | ICD-10-CM | POA: Diagnosis not present

## 2021-06-28 DIAGNOSIS — Z85038 Personal history of other malignant neoplasm of large intestine: Secondary | ICD-10-CM | POA: Diagnosis not present

## 2021-06-28 DIAGNOSIS — Z85828 Personal history of other malignant neoplasm of skin: Secondary | ICD-10-CM | POA: Diagnosis not present

## 2021-06-28 DIAGNOSIS — R1310 Dysphagia, unspecified: Secondary | ICD-10-CM | POA: Diagnosis not present

## 2021-06-28 DIAGNOSIS — N184 Chronic kidney disease, stage 4 (severe): Secondary | ICD-10-CM | POA: Diagnosis not present

## 2021-06-28 DIAGNOSIS — Z87440 Personal history of urinary (tract) infections: Secondary | ICD-10-CM | POA: Diagnosis not present

## 2021-06-28 DIAGNOSIS — E43 Unspecified severe protein-calorie malnutrition: Secondary | ICD-10-CM | POA: Diagnosis not present

## 2021-06-28 DIAGNOSIS — K219 Gastro-esophageal reflux disease without esophagitis: Secondary | ICD-10-CM | POA: Diagnosis not present

## 2021-06-28 DIAGNOSIS — E785 Hyperlipidemia, unspecified: Secondary | ICD-10-CM | POA: Diagnosis not present

## 2021-06-28 DIAGNOSIS — Z87891 Personal history of nicotine dependence: Secondary | ICD-10-CM | POA: Diagnosis not present

## 2021-06-28 DIAGNOSIS — I951 Orthostatic hypotension: Secondary | ICD-10-CM | POA: Diagnosis not present

## 2021-06-28 DIAGNOSIS — Z7901 Long term (current) use of anticoagulants: Secondary | ICD-10-CM | POA: Diagnosis not present

## 2021-06-28 DIAGNOSIS — E039 Hypothyroidism, unspecified: Secondary | ICD-10-CM | POA: Diagnosis not present

## 2021-07-01 ENCOUNTER — Inpatient Hospital Stay: Payer: Medicare Other | Attending: Oncology

## 2021-07-01 ENCOUNTER — Ambulatory Visit (HOSPITAL_COMMUNITY)
Admission: RE | Admit: 2021-07-01 | Discharge: 2021-07-01 | Disposition: A | Discharge status: Home / Self Care | Payer: Medicare Other | Source: Ambulatory Visit | Attending: Oncology | Admitting: Oncology

## 2021-07-01 ENCOUNTER — Other Ambulatory Visit: Payer: Self-pay

## 2021-07-01 DIAGNOSIS — J439 Emphysema, unspecified: Secondary | ICD-10-CM | POA: Diagnosis not present

## 2021-07-01 DIAGNOSIS — Z85820 Personal history of malignant melanoma of skin: Secondary | ICD-10-CM | POA: Insufficient documentation

## 2021-07-01 DIAGNOSIS — C434 Malignant melanoma of scalp and neck: Secondary | ICD-10-CM | POA: Diagnosis not present

## 2021-07-01 DIAGNOSIS — C184 Malignant neoplasm of transverse colon: Secondary | ICD-10-CM | POA: Insufficient documentation

## 2021-07-01 DIAGNOSIS — I700 Atherosclerosis of aorta: Secondary | ICD-10-CM | POA: Diagnosis not present

## 2021-07-01 DIAGNOSIS — R339 Retention of urine, unspecified: Secondary | ICD-10-CM | POA: Insufficient documentation

## 2021-07-01 DIAGNOSIS — K5730 Diverticulosis of large intestine without perforation or abscess without bleeding: Secondary | ICD-10-CM | POA: Diagnosis not present

## 2021-07-01 DIAGNOSIS — D649 Anemia, unspecified: Secondary | ICD-10-CM

## 2021-07-01 DIAGNOSIS — N2889 Other specified disorders of kidney and ureter: Secondary | ICD-10-CM | POA: Diagnosis not present

## 2021-07-01 DIAGNOSIS — C189 Malignant neoplasm of colon, unspecified: Secondary | ICD-10-CM | POA: Diagnosis not present

## 2021-07-01 DIAGNOSIS — D509 Iron deficiency anemia, unspecified: Secondary | ICD-10-CM | POA: Insufficient documentation

## 2021-07-01 LAB — CBC WITH DIFFERENTIAL (CANCER CENTER ONLY)
Abs Immature Granulocytes: 0.02 10*3/uL (ref 0.00–0.07)
Basophils Absolute: 0 10*3/uL (ref 0.0–0.1)
Basophils Relative: 1 %
Eosinophils Absolute: 0.2 10*3/uL (ref 0.0–0.5)
Eosinophils Relative: 3 %
HCT: 33.3 % — ABNORMAL LOW (ref 39.0–52.0)
Hemoglobin: 10.4 g/dL — ABNORMAL LOW (ref 13.0–17.0)
Immature Granulocytes: 0 %
Lymphocytes Relative: 23 %
Lymphs Abs: 1.2 10*3/uL (ref 0.7–4.0)
MCH: 28.3 pg (ref 26.0–34.0)
MCHC: 31.2 g/dL (ref 30.0–36.0)
MCV: 90.5 fL (ref 80.0–100.0)
Monocytes Absolute: 0.4 10*3/uL (ref 0.1–1.0)
Monocytes Relative: 8 %
Neutro Abs: 3.4 10*3/uL (ref 1.7–7.7)
Neutrophils Relative %: 65 %
Platelet Count: 302 10*3/uL (ref 150–400)
RBC: 3.68 MIL/uL — ABNORMAL LOW (ref 4.22–5.81)
RDW: 14 % (ref 11.5–15.5)
WBC Count: 5.3 10*3/uL (ref 4.0–10.5)
nRBC: 0 % (ref 0.0–0.2)

## 2021-07-01 LAB — CMP (CANCER CENTER ONLY)
ALT: <5 U/L (ref 0–44)
AST: 12 U/L — ABNORMAL LOW (ref 15–41)
Albumin: 3.7 g/dL (ref 3.5–5.0)
Alkaline Phosphatase: 84 U/L (ref 38–126)
Anion gap: 5 (ref 5–15)
BUN: 23 mg/dL (ref 8–23)
CO2: 28 mmol/L (ref 22–32)
Calcium: 9.5 mg/dL (ref 8.9–10.3)
Chloride: 105 mmol/L (ref 98–111)
Creatinine: 2.31 mg/dL — ABNORMAL HIGH (ref 0.61–1.24)
GFR, Estimated: 28 mL/min — ABNORMAL LOW (ref >60)
Glucose, Bld: 93 mg/dL (ref 70–99)
Potassium: 5.1 mmol/L (ref 3.5–5.1)
Sodium: 138 mmol/L (ref 135–145)
Total Bilirubin: 0.3 mg/dL (ref 0.3–1.2)
Total Protein: 7.5 g/dL (ref 6.5–8.1)

## 2021-07-01 LAB — IRON AND IRON BINDING CAPACITY (CC-WL,HP ONLY)
Iron: 30 ug/dL — ABNORMAL LOW (ref 45–182)
Saturation Ratios: 9 % — ABNORMAL LOW (ref 17.9–39.5)
TIBC: 318 ug/dL (ref 250–450)
UIBC: 288 ug/dL (ref 117–376)

## 2021-07-01 LAB — FERRITIN: Ferritin: 48 ng/mL (ref 24–336)

## 2021-07-04 DIAGNOSIS — E785 Hyperlipidemia, unspecified: Secondary | ICD-10-CM | POA: Diagnosis not present

## 2021-07-04 DIAGNOSIS — I129 Hypertensive chronic kidney disease with stage 1 through stage 4 chronic kidney disease, or unspecified chronic kidney disease: Secondary | ICD-10-CM | POA: Diagnosis not present

## 2021-07-04 DIAGNOSIS — Z87440 Personal history of urinary (tract) infections: Secondary | ICD-10-CM | POA: Diagnosis not present

## 2021-07-04 DIAGNOSIS — K219 Gastro-esophageal reflux disease without esophagitis: Secondary | ICD-10-CM | POA: Diagnosis not present

## 2021-07-04 DIAGNOSIS — I951 Orthostatic hypotension: Secondary | ICD-10-CM | POA: Diagnosis not present

## 2021-07-04 DIAGNOSIS — Z87891 Personal history of nicotine dependence: Secondary | ICD-10-CM | POA: Diagnosis not present

## 2021-07-04 DIAGNOSIS — R339 Retention of urine, unspecified: Secondary | ICD-10-CM | POA: Diagnosis not present

## 2021-07-04 DIAGNOSIS — I480 Paroxysmal atrial fibrillation: Secondary | ICD-10-CM | POA: Diagnosis not present

## 2021-07-04 DIAGNOSIS — Z8673 Personal history of transient ischemic attack (TIA), and cerebral infarction without residual deficits: Secondary | ICD-10-CM | POA: Diagnosis not present

## 2021-07-04 DIAGNOSIS — Z9181 History of falling: Secondary | ICD-10-CM | POA: Diagnosis not present

## 2021-07-04 DIAGNOSIS — D631 Anemia in chronic kidney disease: Secondary | ICD-10-CM | POA: Diagnosis not present

## 2021-07-04 DIAGNOSIS — Z960 Presence of urogenital implants: Secondary | ICD-10-CM | POA: Diagnosis not present

## 2021-07-04 DIAGNOSIS — Z9049 Acquired absence of other specified parts of digestive tract: Secondary | ICD-10-CM | POA: Diagnosis not present

## 2021-07-04 DIAGNOSIS — N184 Chronic kidney disease, stage 4 (severe): Secondary | ICD-10-CM | POA: Diagnosis not present

## 2021-07-04 DIAGNOSIS — E43 Unspecified severe protein-calorie malnutrition: Secondary | ICD-10-CM | POA: Diagnosis not present

## 2021-07-04 DIAGNOSIS — Z85828 Personal history of other malignant neoplasm of skin: Secondary | ICD-10-CM | POA: Diagnosis not present

## 2021-07-04 DIAGNOSIS — I6523 Occlusion and stenosis of bilateral carotid arteries: Secondary | ICD-10-CM | POA: Diagnosis not present

## 2021-07-04 DIAGNOSIS — L309 Dermatitis, unspecified: Secondary | ICD-10-CM | POA: Diagnosis not present

## 2021-07-04 DIAGNOSIS — E039 Hypothyroidism, unspecified: Secondary | ICD-10-CM | POA: Diagnosis not present

## 2021-07-04 DIAGNOSIS — Z85038 Personal history of other malignant neoplasm of large intestine: Secondary | ICD-10-CM | POA: Diagnosis not present

## 2021-07-04 DIAGNOSIS — Z7901 Long term (current) use of anticoagulants: Secondary | ICD-10-CM | POA: Diagnosis not present

## 2021-07-04 DIAGNOSIS — R1310 Dysphagia, unspecified: Secondary | ICD-10-CM | POA: Diagnosis not present

## 2021-07-05 ENCOUNTER — Other Ambulatory Visit: Payer: Medicare Other | Admitting: Internal Medicine

## 2021-07-05 ENCOUNTER — Inpatient Hospital Stay (HOSPITAL_BASED_OUTPATIENT_CLINIC_OR_DEPARTMENT_OTHER): Payer: Medicare Other | Admitting: Oncology

## 2021-07-05 ENCOUNTER — Other Ambulatory Visit: Payer: Self-pay

## 2021-07-05 VITALS — BP 101/43 | HR 56 | Temp 97.6°F | Resp 16 | Ht 69.0 in | Wt 111.8 lb

## 2021-07-05 DIAGNOSIS — D649 Anemia, unspecified: Secondary | ICD-10-CM

## 2021-07-05 DIAGNOSIS — C184 Malignant neoplasm of transverse colon: Secondary | ICD-10-CM | POA: Diagnosis not present

## 2021-07-05 DIAGNOSIS — Z85820 Personal history of malignant melanoma of skin: Secondary | ICD-10-CM | POA: Diagnosis not present

## 2021-07-05 DIAGNOSIS — R339 Retention of urine, unspecified: Secondary | ICD-10-CM | POA: Diagnosis not present

## 2021-07-05 DIAGNOSIS — D509 Iron deficiency anemia, unspecified: Secondary | ICD-10-CM | POA: Diagnosis not present

## 2021-07-05 NOTE — Progress Notes (Signed)
Hematology and Oncology Follow Up   MARTEL GALVAN [id number] 10-29-1940 81 y.o. 07/05/2021 2:42 PM [name], DO      Principle Diagnosis: 81 year old man with T3N0 colon cancer arising from the transverse colon confirmed after surgical resection on August 06, 2020.  He had an intact mismatch repair protein analysis.  Secondary diagnoses:  1.  Stage IIc scalp melanoma diagnosed in 2019.     2.  Iron deficiency anemia related to chronic blood losses.    Prior Therapy:    He is status post excision of a scalp lesion and sentinel lymph node biopsy and a selective left neck dissection completed in April 2019.  He is status post robotic assisted right colectomy for transverse colon cancer diagnosed on August 06, 2020.  The final pathology showed invasive adenocarcinoma that is moderately differentiated with 0 out of 18 lymph nodes involved.   Current therapy: Active surveillance.      Interim History: Mr. Hilbert is here for return evaluation.  Since the last visit, he reports no major changes in his health.  He did develop urinary retention and required a catheter which remains in place.  He denies any hematochezia, melena or hemoptysis.  He denies any recent hospitalizations or illnesses.  He denies any bone pain or pathological fractures.      Medications: Reviewed without changes. Current Outpatient Medications  Medication Sig Dispense Refill   ACETAMINOPHEN EXTRA STRENGTH 500 MG tablet NEW PRESCRIPTION REQUEST: TAKE ONE TABLET BY MOUTH EVERY 6 HOURS AS NEEDED 360 tablet 3   atorvastatin (LIPITOR) 40 MG tablet NEW PRESCRIPTION REQUEST: TAKE ONE TABLET BY MOUTH EVERY MORNING 90 tablet 3   cholecalciferol (VITAMIN D) 25 MCG (1000 UNIT) tablet NEW PRESCRIPTION REQUEST: TAKE ONE TABLET BY MOUTH EVERY MORNING 90 tablet 3   Dapsone 5 % topical gel Apply 1 application. topically 2 (two) times a week.     ELIQUIS 2.5 MG TABS tablet TAKE 1 TABLET BY MOUTH TWICE A DAY  180 tablet 1   Ensure Plus (ENSURE PLUS) LIQD Take 237 mLs by mouth 2 (two) times daily between meals. Choc or Vanilla (Patient not taking: Reported on 06/15/2021)     famotidine (PEPCID) 20 MG tablet Take 1 tablet (20 mg total) by mouth daily.     Ferrous Sulfate (IRON PO) Take 1 tablet by mouth daily.     finasteride (PROSCAR) 5 MG tablet Take 5 mg by mouth daily.     loperamide (IMODIUM) 2 MG capsule Take by mouth as needed for diarrhea or loose stools.     midodrine (PROAMATINE) 2.5 MG tablet Take 1 tablet (2.5 mg total) by mouth 2 (two) times daily with a meal. 180 tablet 1   Multiple Vitamin (MULTIVITAMIN) capsule Take 1 capsule by mouth daily.  (Patient not taking: Reported on 06/15/2021)     timolol (TIMOPTIC) 0.5 % ophthalmic solution NEW PRESCRIPTION REQUEST: INSTILL ONE DROP IN EACH EYE IN THE MORNING AND IN THE EVENING 45 mL 3   No current facility-administered medications for this visit.     Allergies:  Allergies  Allergen Reactions   Penicillins Other (See Comments)    Has patient had a PCN reaction causing immediate rash, facial/tongue/throat swelling, SOB or lightheadedness with hypotension: Unknown Has patient had a PCN reaction causing severe rash involving mucus membranes or skin necrosis: Unknown Has patient had a PCN reaction that required hospitalization: Unknown Has patient had a PCN reaction occurring within the last 10 years: No If all  of the above answers are "NO", then may proceed with Cephalosporin use. Tolerated cephlasporins   Physical exam:     Blood pressure (!) 101/43, pulse (!) 56, temperature 97.6 F (36.4 C), temperature source Temporal, resp. rate 16, height 5' 9"  (1.753 m), weight 111 lb 12.8 oz (50.7 kg), SpO2 100 %.         ECOG 1    General appearance: Comfortable appearing without any discomfort Head: Normocephalic without any trauma Oropharynx: Mucous membranes are moist and pink without any thrush or ulcers. Eyes: Pupils are  equal and round reactive to light. Lymph nodes: No cervical, supraclavicular, inguinal or axillary lymphadenopathy.   Heart:regular rate and rhythm.  S1 and S2 without leg edema. Lung: Clear without any rhonchi or wheezes.  No dullness to percussion. Abdomin: Soft, nontender, nondistended with good bowel sounds.  No hepatosplenomegaly. Musculoskeletal: No joint deformity or effusion.  Full range of motion noted. Neurological: No deficits noted on motor, sensory and deep tendon reflex exam. Skin: No petechial rash or dryness.  Appeared moist.  Psychiatric: Mood and affect appeared appropriate.           Lab Results: Lab Results  Component Value Date   WBC 5.3 07/01/2021   HGB 10.4 (L) 07/01/2021   HCT 33.3 (L) 07/01/2021   MCV 90.5 07/01/2021   PLT 302 07/01/2021   PSA 0.82 Test Methodology: [employer] PSA 06/12/2010     Chemistry      Component Value Date/Time   NA 138 07/01/2021 1448   NA 137 02/03/2021 1601   K 5.1 07/01/2021 1448   CL 105 07/01/2021 1448   CO2 28 07/01/2021 1448   BUN 23 07/01/2021 1448   BUN 22 02/03/2021 1601   CREATININE 2.31 (H) 07/01/2021 1448      Component Value Date/Time   CALCIUM 9.5 07/01/2021 1448   CALCIUM 7.5 (L) 07/11/2010 0400   ALKPHOS 84 07/01/2021 1448   AST 12 (L) 07/01/2021 1448   ALT <5 07/01/2021 1448   BILITOT 0.3 07/01/2021 1448     IMPRESSION: 1. Stable emphysematous changes and pulmonary scarring. No worrisome pulmonary lesions or pulmonary nodules to suggest metastatic disease. 2. Stable advanced atherosclerotic calcifications involving the thoracic and abdominal aorta and branch vessels including the coronary arteries. 3. No acute abdominal/pelvic findings, mass lesions or adenopathy. 4. Remote surgical changes from a partial right hemicolectomy. No complicating features. 5. No worrisome subcutaneous lesions.     81 year old man with:   1.    Stage IIC melanoma of the scalp diagnosed in 2019.   He is  currently on active surveillance without any evidence of relapsed disease.  CT scan obtained on 03/03/2021 was personally reviewed and showed no areas of concerning metastasis.  I recommended continued active surveillance.    2.  T3N0 colon cancer diagnosed in April 2021.  He is currently on active surveillance without any evidence of relapsed disease.  CT scan obtained on 03/03/2021 showed no evidence of relapse.  I have recommended that we repeat the scan in 1 year and potentially suspend surveillance after that.     3.  Iron deficiency anemia: Related to chronic GI blood losses from colon cancer.  Iron level continues to be close to normal range with hemoglobin is improving.  Recommended continued oral iron treatment and will repeat IV iron if needed in the future.  4.  Follow-up: In 6 months for repeat evaluation.   30  minutes were spent on this encounter.  The time  was dedicated to reviewing laboratory data, disease status update and outlining future plan of care discussion.  Zola Button, MD 07/05/2021 2:42 PM

## 2021-07-07 ENCOUNTER — Other Ambulatory Visit: Payer: Medicare Other | Admitting: Internal Medicine

## 2021-07-07 VITALS — BP 129/71 | HR 50

## 2021-07-07 DIAGNOSIS — C184 Malignant neoplasm of transverse colon: Secondary | ICD-10-CM

## 2021-07-07 DIAGNOSIS — R339 Retention of urine, unspecified: Secondary | ICD-10-CM | POA: Diagnosis not present

## 2021-07-07 DIAGNOSIS — D500 Iron deficiency anemia secondary to blood loss (chronic): Secondary | ICD-10-CM

## 2021-07-07 DIAGNOSIS — N184 Chronic kidney disease, stage 4 (severe): Secondary | ICD-10-CM | POA: Diagnosis not present

## 2021-07-07 NOTE — Progress Notes (Signed)
[facility] Palliative Care Follow-Up Visit Telephone: [phone]  Fax: [phone]   Date of encounter: 07/07/21 8:01 AM PATIENT NAME: Keith [name] [address] [place] [address]   [phone] (home)  DOB: [birth year] MRN: [id number] PRIMARY CARE PROVIDER:    Rise Patience, DO,  [place] [address] [phone]  REFERRING PROVIDER:   Rise Patience, DO [address] [place],  [place] [address] [phone]  RESPONSIBLE PARTY:    Contact Information     Name Relation Home Work [place] Son [phone]  [phone]   Candace Cruise [phone]  [phone]        I met face to face with patient and family in his home. Palliative Care was asked to follow this patient by consultation request of  [name], [name], DO to address advance care planning and complex medical decision making. This is follow-up visit.                                     ASSESSMENT AND PLAN / RECOMMENDATIONS:   Advance Care Planning/Goals of Care: Goals include to maximize quality of life and symptom management. Patient/health care surrogate gave his/her permission to discuss.Our advance care planning conversation included a discussion about:    The value and importance of advance care planning  Experiences with loved ones who have been seriously ill or have died  Exploration of personal, cultural or spiritual beliefs that might influence medical decisions  Exploration of goals of care in the event of a sudden injury or illness  Identification  of a healthcare agent--living will/hcpoa in vyn[place] Review and updating or creation of an  advance directive document . Decision not to resuscitate or to de-escalate disease focused treatments due to poor prognosis. CODE STATUS:  DNR on file   Symptom Management/Plan: 1. Malignant neoplasm of transverse colon ([place]) -no further issues in this area--last CT without any  recurrence -weight fairly stable   2. Iron deficiency anemia due to chronic blood loss -continues on oral iron, but has not required any recent infusions, continue monitoring by primary team and heme/onc  3. Chronic kidney disease, stage IV (severe) (HCC) -had been stable until episode 2 wks ago, maintain hydration as he did have an episode of syncope attributed to dehydration and UTI 2 wks ago (also had urinary retention recur), avoid nsaids and other nephrotoxic meds  4. Urinary retention -has foley back in and hopes to once again go without it after urology follow-up   Follow up Palliative Care Visit: Palliative care will continue to follow for complex medical decision making, advance care planning, and clarification of goals. Return 10/06/2021 And prn.  This visit was coded based on medical decision making (MDM).  PPS: 50%  HOSPICE ELIGIBILITY/DIAGNOSIS: TBD  Chief Complaint: Follow-up palliative visit  HISTORY OF PRESENT ILLNESS:  Keith [name] is a 81 y.o. year old male  with CKD, urinary retention, colon cancer, p afib, prior stroke, gerd, hypothyroidism, seen for palliative follow-up at home.   Updates me on his appts:  had good report from nephrology about kidneys, also oncology visit was good without recurrence of cancer on CT scans.  Dr. Alen Blew following him for his mild iron deficiency--no longer needs the infusions.    Scalp wound doing better--down to three bandaids to cover.  Had a fall in the bathroom when got lightheaded--hit head on bathroom  sink after passing out.  Turns out he was dehydrated and treated for UTI.  Notes indicate he had retention again--he says about 300cc.  Unfortunately, wound up with catheter back in that had finally been taken out.  Now has to f/u with urology again.    He's had [facility] home health after hospitalization.  Someone to come next week.  Has had some PT and also walks with his cane down the block in pretty weather.  If he's tired  and weak, he sits and rests and then finishes the routine.  He makes his own meals--cooks himself food.  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    He overdid going out in the yard trimming bushes.  Has some soreness of his neck and back from that.    Spirits ok here lately.  Denies depression and SI, HI.    He is taking the midodrine again by his report (was not taking which resulted in hypotension).    Bowels are doing very well.  No diarrhea and going regularly.    History obtained from review of EMR, discussion with primary team, and interview with family, facility staff/caregiver and/or Mr. Kozma.   I reviewed available labs, medications, imaging, studies and related documents from the EMR.  Records reviewed and summarized above.   ROS Review of Systems  Constitutional:  Positive for fatigue. Negative for activity change, appetite change, chills, fever and unexpected weight change.  HENT:  Negative for congestion, hearing loss and trouble swallowing.   Eyes:  Negative for visual disturbance.       Glasses  Respiratory:  Negative for chest tightness and shortness of breath.   Cardiovascular:  Negative for chest pain, palpitations and leg swelling.  Gastrointestinal:  Negative for constipation, diarrhea, nausea and vomiting.  Genitourinary:  Negative for dysuria.       Retention, has foley again  Musculoskeletal:  Positive for myalgias.  Skin:  Positive for pallor.  Neurological:  Positive for weakness. Negative for dizziness and light-headedness.       Admits he's a Leppo wobbly since his stroke (had right hemiparesis before--functions fine other than a Platner trouble with small hand coordination)  Psychiatric/Behavioral:  Negative for dysphoric mood and sleep disturbance. The patient is not nervous/anxious.     Physical Exam: wt staying 110-113 lbs There were no vitals filed for  this visit. There is no height or weight on file to calculate BMI. Wt Readings from Last 500 Encounters:  07/05/21 111 lb 12.8 oz (50.7 kg)  06/24/21 112 lb (50.8 kg)  06/15/21 114 lb (51.7 kg)  05/23/21 111 lb 3.2 oz (50.4 kg)  02/08/21 110 lb 1.6 oz (49.9 kg)  02/03/21 109 lb (49.4 kg)  11/04/20 104 lb 8 oz (47.4 kg)  10/25/20 104 lb (47.2 kg)  10/20/20 104 lb 6.4 oz (47.4 kg)  10/05/20 100 lb 8 oz (45.6 kg)  09/23/20 98 lb 1.7 oz (44.5 kg)  09/10/20 99 lb 9.6 oz (45.2 kg)  09/02/20 99 lb (44.9 kg)  08/13/20 108 lb 11 oz (49.3 kg)  08/11/20 110 lb 10.7 oz (50.2 kg)  07/29/20 106 lb (48.1 kg)  07/01/20 112 lb 6.4 oz (51 kg)  05/10/20 113 lb (51.3 kg)  04/26/20 114 lb 12.8 oz (52.1 kg)  03/25/20 114 lb 9.6 oz (52 kg)  11/05/19 116 lb 2.9 oz (52.7 kg)  09/26/19 116 lb 3.2 oz (  52.7 kg)  06/06/19 115 lb 6.4 oz (52.3 kg)  05/22/19 112 lb 8 oz (51 kg)  05/15/19 112 lb (50.8 kg)  05/12/19 113 lb 15.7 oz (51.7 kg)  05/08/19 116 lb (52.6 kg)  03/26/19 113 lb (51.3 kg)  02/18/19 111 lb 6 oz (50.5 kg)  01/24/18 115 lb (52.2 kg)  01/23/18 118 lb 3.2 oz (53.6 kg)  01/17/18 115 lb 6.4 oz (52.3 kg)  12/10/17 117 lb (53.1 kg)  11/22/17 116 lb 9.6 oz (52.9 kg)  11/07/17 115 lb 12.8 oz (52.5 kg)  11/02/17 115 lb (52.2 kg)  10/10/17 116 lb (52.6 kg)  09/11/17 113 lb 9.6 oz (51.5 kg)  09/03/17 115 lb 12.8 oz (52.5 kg)  06/04/17 117 lb 4.6 oz (53.2 kg)  05/29/17 111 lb 3.2 oz (50.4 kg)  05/16/17 110 lb 12.8 oz (50.3 kg)  05/08/17 108 lb 9.6 oz (49.3 kg)  04/23/17 120 lb 5.9 oz (54.6 kg)  03/20/17 120 lb (54.4 kg)  02/07/17 119 lb 9.6 oz (54.3 kg)  11/23/16 139 lb 13.3 oz (63.4 kg)  11/19/16 140 lb (63.5 kg)   Physical Exam Vitals reviewed.  Constitutional:      Appearance: Normal appearance.     Comments: Frail, pale  HENT:     Head: Normocephalic and atraumatic.  Eyes:     Conjunctiva/sclera: Conjunctivae normal.     Pupils: Pupils are equal, round, and reactive to light.   Pulmonary:     Effort: Pulmonary effort is normal.     Breath sounds: Normal breath sounds. No wheezing, rhonchi or rales.  Abdominal:     General: Bowel sounds are normal.     Palpations: Abdomen is soft.  Genitourinary:    Comments: Foley catheter in place Musculoskeletal:        General: Normal range of motion.     Right lower leg: No edema.     Left lower leg: No edema.  Skin:    Coloration: Skin is pale.  Neurological:     General: No focal deficit present.     Mental Status: He is alert and oriented to person, place, and time.  Psychiatric:        Mood and Affect: Mood normal.     Comments: Pleasant and talkative     CURRENT PROBLEM LIST:  Patient Active Problem List   Diagnosis Date Noted   Weakness 06/15/2021   Anemia 11/04/2020   Benign esophageal stricture    Difficulty with speech    Orthostatic hypotension 09/05/2020   Internal hernia 08/13/2020   Encounter for assessment for small bowel obstruciton 08/13/2020   Protein-calorie malnutrition, severe 08/11/2020   Cancer of transverse colon ([place]) 08/06/2020   Coronary artery calcification 05/25/2020   Adenocarcinoma ([place]) 05/22/2019   Mass of colon    Gastroesophageal reflux disease 02/18/2019   Iron deficiency 09/26/2018   Anemia in stage 4 chronic kidney disease ([place]) 05/21/2018   Vitamin D deficiency 05/21/2018   History of CVA (cerebrovascular accident) 01/31/2018   TSH elevation 01/28/2018   Underweight 01/24/2018   CKD (chronic kidney disease) stage 4, GFR 15-29 ml/min (HCC) 12/10/2017   Hypotension 12/10/2017   Paroxysmal atrial fibrillation ([place]) 11/07/2017   Bilateral carotid artery stenosis 09/03/2017   Malignant melanoma of skin of scalp ([place]) 03/26/2017   Hyperlipidemia 02/08/2017   SAH (subarachnoid hemorrhage) ([place]) 11/20/2016   Cerebrovascular accident (CVA) due to occlusion of left carotid artery ([place]) 11/20/2016    PAST MEDICAL HISTORY:  Active Ambulatory Problems  Diagnosis Date  Noted   SAH (subarachnoid hemorrhage) ([place]) 11/20/2016   Cerebrovascular accident (CVA) due to occlusion of left carotid artery ([place]) 11/20/2016   Hyperlipidemia 02/08/2017   Malignant melanoma of skin of scalp ([place]) 03/26/2017   Bilateral carotid artery stenosis 09/03/2017   Paroxysmal atrial fibrillation ([place]) 11/07/2017   CKD (chronic kidney disease) stage 4, GFR 15-29 ml/min (HCC) 12/10/2017   Hypotension 12/10/2017   Underweight 01/24/2018   TSH elevation 01/28/2018   Gastroesophageal reflux disease 02/18/2019   Anemia in stage 4 chronic kidney disease ([place]) 05/21/2018   History of CVA (cerebrovascular accident) 01/31/2018   Iron deficiency 09/26/2018   Vitamin D deficiency 05/21/2018   Mass of colon    Adenocarcinoma ([place]) 05/22/2019   Coronary artery calcification 05/25/2020   Cancer of transverse colon ([place]) 08/06/2020   Protein-calorie malnutrition, severe 08/11/2020   Internal hernia 08/13/2020   Encounter for assessment for small bowel obstruciton 08/13/2020   Orthostatic hypotension 09/05/2020   Difficulty with speech    Benign esophageal stricture    Anemia 11/04/2020   Weakness 06/15/2021   Resolved Ambulatory Problems    Diagnosis Date Noted   Neoplasm of skin of scalp 11/20/2016   Gait disturbance    Subarachnoid hemorrhage (HCC)    Dysphagia, post-stroke    CKD (chronic kidney disease) stage 3, GFR 30-59 ml/min (HCC)    Hyperglycemia    Acute blood loss anemia    Hemiparesis affecting right side as late effect of cerebrovascular accident (CVA) ([place]) 11/23/2016   Gait disturbance, post-stroke 11/23/2016   Left middle cerebral artery stroke ([place]) 11/23/2016   Cataract of both eyes 02/08/2017   Carotid occlusion, left 02/08/2017   Carotid stenosis, asymptomatic, right 78/46/9629   Dentures complicating chewing 52/84/1324   Acute left ankle pain 06/27/2018   Symptomatic anemia 05/08/2019   Acute blood loss anemia 09/22/2020   Urinary tract infection  without hematuria    Past Medical History:  Diagnosis Date   Cataract    Chronic kidney disease    Complication of anesthesia    Dysrhythmia    GERD (gastroesophageal reflux disease)    Glaucoma    History of hiatal hernia    Hypothyroidism    Hypothyroidism, postsurgical 11/20/2016   Melanoma ([place])    Melanoma of scalp ([place]) 04/23/2017   Obstructive uropathy    PONV (postoperative nausea and vomiting) 03/20/2017   Stroke ([place]) 11/2016    SOCIAL HX:  Social History   Tobacco Use   Smoking status: Former    Packs/day: 2.00    Years: 32.00    Total pack years: 64.00    Types: Cigarettes    Quit date: 1983    Years since quitting: 40.4   Smokeless tobacco: Never   Tobacco comments:    No plans to start  Substance Use Topics   Alcohol use: No     ALLERGIES:  Allergies  Allergen Reactions   Penicillins Other (See Comments)    Has patient had a PCN reaction causing immediate rash, facial/tongue/throat swelling, SOB or lightheadedness with hypotension: Unknown Has patient had a PCN reaction causing severe rash involving mucus membranes or skin necrosis: Unknown Has patient had a PCN reaction that required hospitalization: Unknown Has patient had a PCN reaction occurring within the last 10 years: No If all of the above answers are "NO", then may proceed with Cephalosporin use. Tolerated cephlasporins      PERTINENT MEDICATIONS:  Outpatient Encounter Medications as of 07/07/2021  Medication Sig  ACETAMINOPHEN EXTRA STRENGTH 500 MG tablet NEW PRESCRIPTION REQUEST: TAKE ONE TABLET BY MOUTH EVERY 6 HOURS AS NEEDED   atorvastatin (LIPITOR) 40 MG tablet NEW PRESCRIPTION REQUEST: TAKE ONE TABLET BY MOUTH EVERY MORNING   cholecalciferol (VITAMIN D) 25 MCG (1000 UNIT) tablet NEW PRESCRIPTION REQUEST: TAKE ONE TABLET BY MOUTH EVERY MORNING   Dapsone 5 % topical gel Apply 1 application. topically 2 (two) times a week.   ELIQUIS 2.5 MG TABS tablet TAKE 1 TABLET BY MOUTH TWICE A  DAY   Ensure Plus (ENSURE PLUS) LIQD Take 237 mLs by mouth 2 (two) times daily between meals. Choc or Vanilla (Patient not taking: Reported on 06/15/2021)   famotidine (PEPCID) 20 MG tablet Take 1 tablet (20 mg total) by mouth daily.   Ferrous Sulfate (IRON PO) Take 1 tablet by mouth daily.   finasteride (PROSCAR) 5 MG tablet Take 5 mg by mouth daily.   loperamide (IMODIUM) 2 MG capsule Take by mouth as needed for diarrhea or loose stools.   midodrine (PROAMATINE) 2.5 MG tablet Take 1 tablet (2.5 mg total) by mouth 2 (two) times daily with a meal.   Multiple Vitamin (MULTIVITAMIN) capsule Take 1 capsule by mouth daily.  (Patient not taking: Reported on 06/15/2021)   timolol (TIMOPTIC) 0.5 % ophthalmic solution NEW PRESCRIPTION REQUEST: INSTILL ONE DROP IN [facility] IN THE MORNING AND IN THE EVENING   No facility-administered encounter medications on file as of 07/07/2021.    Thank you for the opportunity to participate in the care of Mr. Reuss.  The palliative care team will continue to follow. Please call our office at [phone] if we can be of additional assistance.   Hollace Kinnier, DO  COVID-19 PATIENT SCREENING TOOL Asked and negative response unless otherwise noted:  Have you had symptoms of covid, tested positive or been in contact with someone with symptoms/positive test in the past 5-10 days? no

## 2021-07-08 DIAGNOSIS — Z9049 Acquired absence of other specified parts of digestive tract: Secondary | ICD-10-CM | POA: Diagnosis not present

## 2021-07-08 DIAGNOSIS — R1310 Dysphagia, unspecified: Secondary | ICD-10-CM | POA: Diagnosis not present

## 2021-07-08 DIAGNOSIS — N184 Chronic kidney disease, stage 4 (severe): Secondary | ICD-10-CM | POA: Diagnosis not present

## 2021-07-08 DIAGNOSIS — Z7901 Long term (current) use of anticoagulants: Secondary | ICD-10-CM | POA: Diagnosis not present

## 2021-07-08 DIAGNOSIS — E43 Unspecified severe protein-calorie malnutrition: Secondary | ICD-10-CM | POA: Diagnosis not present

## 2021-07-08 DIAGNOSIS — R338 Other retention of urine: Secondary | ICD-10-CM | POA: Diagnosis not present

## 2021-07-08 DIAGNOSIS — I951 Orthostatic hypotension: Secondary | ICD-10-CM | POA: Diagnosis not present

## 2021-07-08 DIAGNOSIS — Z85038 Personal history of other malignant neoplasm of large intestine: Secondary | ICD-10-CM | POA: Diagnosis not present

## 2021-07-08 DIAGNOSIS — Z960 Presence of urogenital implants: Secondary | ICD-10-CM | POA: Diagnosis not present

## 2021-07-08 DIAGNOSIS — L309 Dermatitis, unspecified: Secondary | ICD-10-CM | POA: Diagnosis not present

## 2021-07-08 DIAGNOSIS — Z85828 Personal history of other malignant neoplasm of skin: Secondary | ICD-10-CM | POA: Diagnosis not present

## 2021-07-08 DIAGNOSIS — I6523 Occlusion and stenosis of bilateral carotid arteries: Secondary | ICD-10-CM | POA: Diagnosis not present

## 2021-07-08 DIAGNOSIS — Z9181 History of falling: Secondary | ICD-10-CM | POA: Diagnosis not present

## 2021-07-08 DIAGNOSIS — Z87440 Personal history of urinary (tract) infections: Secondary | ICD-10-CM | POA: Diagnosis not present

## 2021-07-08 DIAGNOSIS — I129 Hypertensive chronic kidney disease with stage 1 through stage 4 chronic kidney disease, or unspecified chronic kidney disease: Secondary | ICD-10-CM | POA: Diagnosis not present

## 2021-07-08 DIAGNOSIS — E039 Hypothyroidism, unspecified: Secondary | ICD-10-CM | POA: Diagnosis not present

## 2021-07-08 DIAGNOSIS — Z8673 Personal history of transient ischemic attack (TIA), and cerebral infarction without residual deficits: Secondary | ICD-10-CM | POA: Diagnosis not present

## 2021-07-08 DIAGNOSIS — D631 Anemia in chronic kidney disease: Secondary | ICD-10-CM | POA: Diagnosis not present

## 2021-07-08 DIAGNOSIS — Z87891 Personal history of nicotine dependence: Secondary | ICD-10-CM | POA: Diagnosis not present

## 2021-07-08 DIAGNOSIS — I480 Paroxysmal atrial fibrillation: Secondary | ICD-10-CM | POA: Diagnosis not present

## 2021-07-08 DIAGNOSIS — R339 Retention of urine, unspecified: Secondary | ICD-10-CM | POA: Diagnosis not present

## 2021-07-08 DIAGNOSIS — E785 Hyperlipidemia, unspecified: Secondary | ICD-10-CM | POA: Diagnosis not present

## 2021-07-08 DIAGNOSIS — K219 Gastro-esophageal reflux disease without esophagitis: Secondary | ICD-10-CM | POA: Diagnosis not present

## 2021-07-14 DIAGNOSIS — Z85828 Personal history of other malignant neoplasm of skin: Secondary | ICD-10-CM | POA: Diagnosis not present

## 2021-07-14 DIAGNOSIS — N184 Chronic kidney disease, stage 4 (severe): Secondary | ICD-10-CM | POA: Diagnosis not present

## 2021-07-14 DIAGNOSIS — I129 Hypertensive chronic kidney disease with stage 1 through stage 4 chronic kidney disease, or unspecified chronic kidney disease: Secondary | ICD-10-CM | POA: Diagnosis not present

## 2021-07-14 DIAGNOSIS — I6523 Occlusion and stenosis of bilateral carotid arteries: Secondary | ICD-10-CM | POA: Diagnosis not present

## 2021-07-14 DIAGNOSIS — Z87440 Personal history of urinary (tract) infections: Secondary | ICD-10-CM | POA: Diagnosis not present

## 2021-07-14 DIAGNOSIS — R1310 Dysphagia, unspecified: Secondary | ICD-10-CM | POA: Diagnosis not present

## 2021-07-14 DIAGNOSIS — Z9049 Acquired absence of other specified parts of digestive tract: Secondary | ICD-10-CM | POA: Diagnosis not present

## 2021-07-14 DIAGNOSIS — D631 Anemia in chronic kidney disease: Secondary | ICD-10-CM | POA: Diagnosis not present

## 2021-07-14 DIAGNOSIS — Z85038 Personal history of other malignant neoplasm of large intestine: Secondary | ICD-10-CM | POA: Diagnosis not present

## 2021-07-14 DIAGNOSIS — Z8673 Personal history of transient ischemic attack (TIA), and cerebral infarction without residual deficits: Secondary | ICD-10-CM | POA: Diagnosis not present

## 2021-07-14 DIAGNOSIS — Z960 Presence of urogenital implants: Secondary | ICD-10-CM | POA: Diagnosis not present

## 2021-07-14 DIAGNOSIS — Z87891 Personal history of nicotine dependence: Secondary | ICD-10-CM | POA: Diagnosis not present

## 2021-07-14 DIAGNOSIS — E43 Unspecified severe protein-calorie malnutrition: Secondary | ICD-10-CM | POA: Diagnosis not present

## 2021-07-14 DIAGNOSIS — I480 Paroxysmal atrial fibrillation: Secondary | ICD-10-CM | POA: Diagnosis not present

## 2021-07-14 DIAGNOSIS — E785 Hyperlipidemia, unspecified: Secondary | ICD-10-CM | POA: Diagnosis not present

## 2021-07-14 DIAGNOSIS — Z7901 Long term (current) use of anticoagulants: Secondary | ICD-10-CM | POA: Diagnosis not present

## 2021-07-14 DIAGNOSIS — I951 Orthostatic hypotension: Secondary | ICD-10-CM | POA: Diagnosis not present

## 2021-07-14 DIAGNOSIS — E039 Hypothyroidism, unspecified: Secondary | ICD-10-CM | POA: Diagnosis not present

## 2021-07-14 DIAGNOSIS — L309 Dermatitis, unspecified: Secondary | ICD-10-CM | POA: Diagnosis not present

## 2021-07-14 DIAGNOSIS — R339 Retention of urine, unspecified: Secondary | ICD-10-CM | POA: Diagnosis not present

## 2021-07-14 DIAGNOSIS — Z9181 History of falling: Secondary | ICD-10-CM | POA: Diagnosis not present

## 2021-07-14 DIAGNOSIS — K219 Gastro-esophageal reflux disease without esophagitis: Secondary | ICD-10-CM | POA: Diagnosis not present

## 2021-07-19 ENCOUNTER — Encounter: Payer: Self-pay | Admitting: Internal Medicine

## 2021-07-21 DIAGNOSIS — E785 Hyperlipidemia, unspecified: Secondary | ICD-10-CM | POA: Diagnosis not present

## 2021-07-21 DIAGNOSIS — L309 Dermatitis, unspecified: Secondary | ICD-10-CM | POA: Diagnosis not present

## 2021-07-21 DIAGNOSIS — N184 Chronic kidney disease, stage 4 (severe): Secondary | ICD-10-CM | POA: Diagnosis not present

## 2021-07-21 DIAGNOSIS — D631 Anemia in chronic kidney disease: Secondary | ICD-10-CM | POA: Diagnosis not present

## 2021-07-21 DIAGNOSIS — I129 Hypertensive chronic kidney disease with stage 1 through stage 4 chronic kidney disease, or unspecified chronic kidney disease: Secondary | ICD-10-CM | POA: Diagnosis not present

## 2021-07-21 DIAGNOSIS — R339 Retention of urine, unspecified: Secondary | ICD-10-CM | POA: Diagnosis not present

## 2021-07-21 DIAGNOSIS — K219 Gastro-esophageal reflux disease without esophagitis: Secondary | ICD-10-CM | POA: Diagnosis not present

## 2021-07-21 DIAGNOSIS — Z85038 Personal history of other malignant neoplasm of large intestine: Secondary | ICD-10-CM | POA: Diagnosis not present

## 2021-07-21 DIAGNOSIS — I480 Paroxysmal atrial fibrillation: Secondary | ICD-10-CM | POA: Diagnosis not present

## 2021-07-21 DIAGNOSIS — Z9181 History of falling: Secondary | ICD-10-CM | POA: Diagnosis not present

## 2021-07-21 DIAGNOSIS — Z85828 Personal history of other malignant neoplasm of skin: Secondary | ICD-10-CM | POA: Diagnosis not present

## 2021-07-21 DIAGNOSIS — Z960 Presence of urogenital implants: Secondary | ICD-10-CM | POA: Diagnosis not present

## 2021-07-21 DIAGNOSIS — Z9049 Acquired absence of other specified parts of digestive tract: Secondary | ICD-10-CM | POA: Diagnosis not present

## 2021-07-21 DIAGNOSIS — Z87891 Personal history of nicotine dependence: Secondary | ICD-10-CM | POA: Diagnosis not present

## 2021-07-21 DIAGNOSIS — Z7901 Long term (current) use of anticoagulants: Secondary | ICD-10-CM | POA: Diagnosis not present

## 2021-07-21 DIAGNOSIS — I6523 Occlusion and stenosis of bilateral carotid arteries: Secondary | ICD-10-CM | POA: Diagnosis not present

## 2021-07-21 DIAGNOSIS — I951 Orthostatic hypotension: Secondary | ICD-10-CM | POA: Diagnosis not present

## 2021-07-21 DIAGNOSIS — Z87440 Personal history of urinary (tract) infections: Secondary | ICD-10-CM | POA: Diagnosis not present

## 2021-07-21 DIAGNOSIS — R1310 Dysphagia, unspecified: Secondary | ICD-10-CM | POA: Diagnosis not present

## 2021-07-21 DIAGNOSIS — E43 Unspecified severe protein-calorie malnutrition: Secondary | ICD-10-CM | POA: Diagnosis not present

## 2021-07-21 DIAGNOSIS — E039 Hypothyroidism, unspecified: Secondary | ICD-10-CM | POA: Diagnosis not present

## 2021-07-21 DIAGNOSIS — Z8673 Personal history of transient ischemic attack (TIA), and cerebral infarction without residual deficits: Secondary | ICD-10-CM | POA: Diagnosis not present

## 2021-08-04 ENCOUNTER — Telehealth: Payer: Self-pay | Admitting: Oncology

## 2021-08-04 NOTE — Telephone Encounter (Signed)
Called patient regarding upcoming December appointments, patient is notified. 

## 2021-08-19 DIAGNOSIS — Z9181 History of falling: Secondary | ICD-10-CM | POA: Diagnosis not present

## 2021-08-19 DIAGNOSIS — Z9049 Acquired absence of other specified parts of digestive tract: Secondary | ICD-10-CM | POA: Diagnosis not present

## 2021-08-19 DIAGNOSIS — N184 Chronic kidney disease, stage 4 (severe): Secondary | ICD-10-CM | POA: Diagnosis not present

## 2021-08-19 DIAGNOSIS — D631 Anemia in chronic kidney disease: Secondary | ICD-10-CM | POA: Diagnosis not present

## 2021-08-19 DIAGNOSIS — E43 Unspecified severe protein-calorie malnutrition: Secondary | ICD-10-CM | POA: Diagnosis not present

## 2021-08-19 DIAGNOSIS — E785 Hyperlipidemia, unspecified: Secondary | ICD-10-CM | POA: Diagnosis not present

## 2021-08-19 DIAGNOSIS — R1310 Dysphagia, unspecified: Secondary | ICD-10-CM | POA: Diagnosis not present

## 2021-08-19 DIAGNOSIS — Z87891 Personal history of nicotine dependence: Secondary | ICD-10-CM | POA: Diagnosis not present

## 2021-08-19 DIAGNOSIS — Z8673 Personal history of transient ischemic attack (TIA), and cerebral infarction without residual deficits: Secondary | ICD-10-CM | POA: Diagnosis not present

## 2021-08-19 DIAGNOSIS — E039 Hypothyroidism, unspecified: Secondary | ICD-10-CM | POA: Diagnosis not present

## 2021-08-19 DIAGNOSIS — Z960 Presence of urogenital implants: Secondary | ICD-10-CM | POA: Diagnosis not present

## 2021-08-19 DIAGNOSIS — L309 Dermatitis, unspecified: Secondary | ICD-10-CM | POA: Diagnosis not present

## 2021-08-19 DIAGNOSIS — Z87440 Personal history of urinary (tract) infections: Secondary | ICD-10-CM | POA: Diagnosis not present

## 2021-08-19 DIAGNOSIS — R339 Retention of urine, unspecified: Secondary | ICD-10-CM | POA: Diagnosis not present

## 2021-08-19 DIAGNOSIS — Z85038 Personal history of other malignant neoplasm of large intestine: Secondary | ICD-10-CM | POA: Diagnosis not present

## 2021-08-19 DIAGNOSIS — I951 Orthostatic hypotension: Secondary | ICD-10-CM | POA: Diagnosis not present

## 2021-08-19 DIAGNOSIS — I6523 Occlusion and stenosis of bilateral carotid arteries: Secondary | ICD-10-CM | POA: Diagnosis not present

## 2021-08-19 DIAGNOSIS — I480 Paroxysmal atrial fibrillation: Secondary | ICD-10-CM | POA: Diagnosis not present

## 2021-08-19 DIAGNOSIS — I129 Hypertensive chronic kidney disease with stage 1 through stage 4 chronic kidney disease, or unspecified chronic kidney disease: Secondary | ICD-10-CM | POA: Diagnosis not present

## 2021-08-19 DIAGNOSIS — Z85828 Personal history of other malignant neoplasm of skin: Secondary | ICD-10-CM | POA: Diagnosis not present

## 2021-08-19 DIAGNOSIS — K219 Gastro-esophageal reflux disease without esophagitis: Secondary | ICD-10-CM | POA: Diagnosis not present

## 2021-08-19 DIAGNOSIS — Z7901 Long term (current) use of anticoagulants: Secondary | ICD-10-CM | POA: Diagnosis not present

## 2021-08-22 DIAGNOSIS — L131 Subcorneal pustular dermatitis: Secondary | ICD-10-CM | POA: Diagnosis not present

## 2021-09-05 ENCOUNTER — Encounter: Payer: Self-pay | Admitting: Family Medicine

## 2021-09-06 NOTE — Telephone Encounter (Signed)
Called Trish to discuss mychart message further. Reports that Mr. Alvizo has been experiencing dizzy episodes for approx one week. She reports that episodes improve with increased PO intake. She encouraged him to drink plenty of fluids yesterday and he is feeling better today. As patient has history of low BP and dizziness recommended follow up with PCP.   Scheduled for tomorrow morning with Dr. [name]. Provided strict ED precautions.   Talbot Grumbling, RN

## 2021-09-07 ENCOUNTER — Ambulatory Visit (INDEPENDENT_AMBULATORY_CARE_PROVIDER_SITE_OTHER): Payer: Medicare Other | Admitting: Family Medicine

## 2021-09-07 ENCOUNTER — Encounter: Payer: Self-pay | Admitting: Family Medicine

## 2021-09-07 VITALS — BP 109/55 | HR 49 | Temp 97.5°F | Wt 108.1 lb

## 2021-09-07 DIAGNOSIS — R5383 Other fatigue: Secondary | ICD-10-CM

## 2021-09-07 DIAGNOSIS — E611 Iron deficiency: Secondary | ICD-10-CM | POA: Diagnosis not present

## 2021-09-07 DIAGNOSIS — N184 Chronic kidney disease, stage 4 (severe): Secondary | ICD-10-CM | POA: Diagnosis not present

## 2021-09-07 DIAGNOSIS — D631 Anemia in chronic kidney disease: Secondary | ICD-10-CM | POA: Diagnosis not present

## 2021-09-07 NOTE — Patient Instructions (Signed)
I am going to check your hemoglobin today and will call you if the results are abnormal.  Make sure to take things a bit slower if you are feeling more fatigued and not eating or drinking as much.   If the hemoglobin is normal and your have improvement, we can plan to follow-up in the next few months. If the hemoglobin is low, we will try to coordinate with Dr. Alen Blew to get you in at [facility].

## 2021-09-07 NOTE — Progress Notes (Signed)
f   SUBJECTIVE:   CHIEF COMPLAINT / HPI:   Dizziness/fatigue - Has been occurring over the last 3 to 4 days - Has had decreased oral intake prior to dizziness worsening - Feels more fatigued than dizzy lately - Has not had any dizziness upon standing and no falls - Feels decreased appetite  PERTINENT  PMH / PSH: Reviewed  OBJECTIVE:   BP (!) 109/55   Pulse (!) 49   Temp (!) 97.5 F (36.4 C) (Oral)   Wt 108 lb 2 oz (49 kg)   SpO2 99%   BMI 15.97 kg/m   Gen: Thin elderly male, overall well-appearing, able to stand and walk without CV: RRR, no m/r/g appreciated, no peripheral edema Pulm: CTAB, no wheezes/crackles  ASSESSMENT/PLAN:   Dizziness/fatigue, history of anemia Patient with increased fatigue over the last several days, is pale at baseline but given fatigue and decreased appetite and oral intake concern for possible drop in hemoglobin.  Patient has required multiple blood and IV iron transfusions in the last 2 years. - CBC and ferritin today - Strict return precautions - Hemoglobin low, will plan to coordinate through Dr. Alen Blew to get patient in with at [facility] long as that is their preference - If hemoglobin is stable, as long as symptoms resolve we can follow-up in the next few months but if symptoms persist, we will follow-up in the next 4 weeks   Dana Debo, [place]

## 2021-09-08 LAB — CBC
Hematocrit: 32.3 % — ABNORMAL LOW (ref 37.5–51.0)
Hemoglobin: 10.4 g/dL — ABNORMAL LOW (ref 13.0–17.7)
MCH: 28.5 pg (ref 26.6–33.0)
MCHC: 32.2 g/dL (ref 31.5–35.7)
MCV: 89 fL (ref 79–97)
Platelets: 337 10*3/uL (ref 150–450)
RBC: 3.65 x10E6/uL — ABNORMAL LOW (ref 4.14–5.80)
RDW: 13.3 % (ref 11.6–15.4)
WBC: 5.7 10*3/uL (ref 3.4–10.8)

## 2021-09-08 LAB — FERRITIN: Ferritin: 174 ng/mL (ref 30–400)

## 2021-09-15 ENCOUNTER — Other Ambulatory Visit: Payer: Self-pay | Admitting: Family Medicine

## 2021-09-15 DIAGNOSIS — E785 Hyperlipidemia, unspecified: Secondary | ICD-10-CM

## 2021-10-06 ENCOUNTER — Encounter: Payer: Medicare Other | Admitting: Internal Medicine

## 2021-10-10 DIAGNOSIS — R338 Other retention of urine: Secondary | ICD-10-CM | POA: Diagnosis not present

## 2021-10-12 DIAGNOSIS — Z85820 Personal history of malignant melanoma of skin: Secondary | ICD-10-CM | POA: Diagnosis not present

## 2021-10-15 NOTE — Progress Notes (Signed)
Visit rescheduled This encounter was created in error - please disregard.

## 2021-10-20 ENCOUNTER — Other Ambulatory Visit: Payer: Self-pay | Admitting: Family Medicine

## 2021-10-20 DIAGNOSIS — K449 Diaphragmatic hernia without obstruction or gangrene: Secondary | ICD-10-CM

## 2021-10-20 DIAGNOSIS — K219 Gastro-esophageal reflux disease without esophagitis: Secondary | ICD-10-CM

## 2021-11-08 ENCOUNTER — Other Ambulatory Visit: Payer: Medicare Other | Admitting: Internal Medicine

## 2021-11-08 VITALS — BP 98/60 | HR 62 | Temp 97.9°F

## 2021-11-08 DIAGNOSIS — D500 Iron deficiency anemia secondary to blood loss (chronic): Secondary | ICD-10-CM

## 2021-11-08 DIAGNOSIS — C434 Malignant melanoma of scalp and neck: Secondary | ICD-10-CM

## 2021-11-08 DIAGNOSIS — Z8673 Personal history of transient ischemic attack (TIA), and cerebral infarction without residual deficits: Secondary | ICD-10-CM | POA: Diagnosis not present

## 2021-11-08 DIAGNOSIS — N184 Chronic kidney disease, stage 4 (severe): Secondary | ICD-10-CM | POA: Diagnosis not present

## 2021-11-08 DIAGNOSIS — Z515 Encounter for palliative care: Secondary | ICD-10-CM

## 2021-11-08 DIAGNOSIS — C184 Malignant neoplasm of transverse colon: Secondary | ICD-10-CM

## 2021-11-09 DIAGNOSIS — N184 Chronic kidney disease, stage 4 (severe): Secondary | ICD-10-CM | POA: Diagnosis not present

## 2021-11-14 DIAGNOSIS — N184 Chronic kidney disease, stage 4 (severe): Secondary | ICD-10-CM | POA: Diagnosis not present

## 2021-11-14 DIAGNOSIS — D631 Anemia in chronic kidney disease: Secondary | ICD-10-CM | POA: Diagnosis not present

## 2021-11-14 DIAGNOSIS — Z8673 Personal history of transient ischemic attack (TIA), and cerebral infarction without residual deficits: Secondary | ICD-10-CM | POA: Diagnosis not present

## 2021-11-16 ENCOUNTER — Encounter: Payer: Self-pay | Admitting: Internal Medicine

## 2021-11-16 NOTE — Progress Notes (Signed)
[facility] Palliative Care Follow-Up Visit Telephone: [phone]  Fax: [phone]   Date of encounter: 11/08/21 2:42 PM PATIENT NAME: Keith [name] [address] [place] [address]   [phone] (home)  DOB: 1940/05/31 MRN: [id number] PRIMARY CARE PROVIDER:    Rise Patience, DO,  [place] [address] [phone]  REFERRING PROVIDER:   Rise Patience, DO [address] [place],  [place] [address] [phone]  RESPONSIBLE PARTY:    Contact Information     Name Relation Home Work [place] Son [phone]  [phone]   Candace Cruise [phone]  [phone]        I met face to face with patient and family in his home. Palliative Care was asked to follow this patient by consultation request of  [name], [name], DO to address advance care planning and complex medical decision making. This is follow-up visit.                                     ASSESSMENT AND PLAN / RECOMMENDATIONS:   Advance Care Planning/Goals of Care: Goals include to maximize quality of life and symptom management. Patient/health care surrogate gave his/her permission to discuss.Our advance care planning conversation included a discussion about:    The value and importance of advance care planning  Experiences with loved ones who have been seriously ill or have died  Exploration of personal, cultural or spiritual beliefs that might influence medical decisions  Exploration of goals of care in the event of a sudden injury or illness  Identification  of a healthcare agent  Review and updating or creation of an  advance directive document . Decision not to resuscitate or to de-escalate disease focused treatments due to poor prognosis. CODE STATUS:  DNR, MOST on file  Symptom Management/Plan: 1. Malignant neoplasm of transverse colon ([place]) -had bowel resection to address this -has had intermittent challenges with #2 as a  result  2. Iron deficiency anemia due to chronic blood loss -being monitored regularly by oncology and primary, has not recently required transfusion or infusion  3. Chronic kidney disease, stage IV (severe) (HCC) -stable lately, has h/o urinary retention before and foley needed, but doing well for several months now w/o  4. History of CVA (cerebrovascular accident) -doing quite well with functioning independently, granddaughter helps out at home but he does cook some for himself, take walks and does his ADLs  5. Malignant melanoma of skin of scalp (HCC) -very slow healing of scalp, monitored carefully  6. Palliative care by specialist -patient is doing quite well, could easily decompensate as he is pretty fragile     Follow up Palliative Care Visit: Palliative care will continue to follow for complex medical decision making, advance care planning, and clarification of goals. Return 12 weeks with RN/SW  This visit was coded based on medical decision making (MDM).  PPS: 50%  HOSPICE ELIGIBILITY/DIAGNOSIS:colon cancer and melanoma  Chief Complaint: Follow-up palliative visit  HISTORY OF PRESENT ILLNESS:  Keith [name] is a 81 y.o. year old male  with  h/o colon cancer s/p resection, iron deficiency anemia, CKD4, prior urinary retention, frailty and melanoma of his scalp seen in palliative f/u.  Doing pretty well--eating and drinking but never gains weight.  Sleeps well.  Bowels are moving w/o melena or hematochezia.  Spirits sometimes down--writes poems to clear his head.  Granddaughter  helps out.  He has several cats he feeds.  History obtained from review of EMR, discussion with primary team, and interview with family, facility staff/caregiver and/or [name].  I reviewed available labs, medications, imaging, studies and related documents from the EMR.  Records reviewed and summarized above.   ROS Review of Systemssee hpi  Physical Exam: Vitals:   11/08/21 1441  BP: 98/60   Pulse: 62  Temp: 97.9 F (36.6 C)  SpO2: 98%   There is no height or weight on file to calculate BMI. Wt Readings from Last 500 Encounters:  09/07/21 108 lb 2 oz (49 kg)  07/05/21 111 lb 12.8 oz (50.7 kg)  06/24/21 112 lb (50.8 kg)  06/15/21 114 lb (51.7 kg)  05/23/21 111 lb 3.2 oz (50.4 kg)  02/08/21 110 lb 1.6 oz (49.9 kg)  02/03/21 109 lb (49.4 kg)  11/04/20 104 lb 8 oz (47.4 kg)  10/25/20 104 lb (47.2 kg)  10/20/20 104 lb 6.4 oz (47.4 kg)  10/05/20 100 lb 8 oz (45.6 kg)  09/23/20 98 lb 1.7 oz (44.5 kg)  09/10/20 99 lb 9.6 oz (45.2 kg)  09/02/20 99 lb (44.9 kg)  08/13/20 108 lb 11 oz (49.3 kg)  08/11/20 110 lb 10.7 oz (50.2 kg)  07/29/20 106 lb (48.1 kg)  07/01/20 112 lb 6.4 oz (51 kg)  05/10/20 113 lb (51.3 kg)  04/26/20 114 lb 12.8 oz (52.1 kg)  03/25/20 114 lb 9.6 oz (52 kg)  11/05/19 116 lb 2.9 oz (52.7 kg)  09/26/19 116 lb 3.2 oz (52.7 kg)  06/06/19 115 lb 6.4 oz (52.3 kg)  05/22/19 112 lb 8 oz (51 kg)  05/15/19 112 lb (50.8 kg)  05/12/19 113 lb 15.7 oz (51.7 kg)  05/08/19 116 lb (52.6 kg)  03/26/19 113 lb (51.3 kg)  02/18/19 111 lb 6 oz (50.5 kg)  01/24/18 115 lb (52.2 kg)  01/23/18 118 lb 3.2 oz (53.6 kg)  01/17/18 115 lb 6.4 oz (52.3 kg)  12/10/17 117 lb (53.1 kg)  11/22/17 116 lb 9.6 oz (52.9 kg)  11/07/17 115 lb 12.8 oz (52.5 kg)  11/02/17 115 lb (52.2 kg)  10/10/17 116 lb (52.6 kg)  09/11/17 113 lb 9.6 oz (51.5 kg)  09/03/17 115 lb 12.8 oz (52.5 kg)  06/04/17 117 lb 4.6 oz (53.2 kg)  05/29/17 111 lb 3.2 oz (50.4 kg)  05/16/17 110 lb 12.8 oz (50.3 kg)  05/08/17 108 lb 9.6 oz (49.3 kg)  04/23/17 120 lb 5.9 oz (54.6 kg)  03/20/17 120 lb (54.4 kg)  02/07/17 119 lb 9.6 oz (54.3 kg)  11/23/16 139 lb 13.3 oz (63.4 kg)  11/19/16 140 lb (63.5 kg)   Physical Exam  CURRENT PROBLEM LIST:  Patient Active Problem List   Diagnosis Date Noted   Weakness 06/15/2021   Anemia 11/04/2020   Benign esophageal stricture    Difficulty with speech     Orthostatic hypotension 09/05/2020   Internal hernia 08/13/2020   Encounter for assessment for small bowel obstruciton 08/13/2020   Protein-calorie malnutrition, severe 08/11/2020   Cancer of transverse colon ([facility]) 08/06/2020   Coronary artery calcification 05/25/2020   Adenocarcinoma ([facility]) 05/22/2019   Mass of colon    Gastroesophageal reflux disease 02/18/2019   Iron deficiency 09/26/2018   Anemia in stage 4 chronic kidney disease ([place] [facility]) 05/21/2018   Vitamin D deficiency 05/21/2018   History of CVA (cerebrovascular accident) 01/31/2018   TSH elevation 01/28/2018   Underweight 01/24/2018   CKD (chronic kidney disease)  stage 4, GFR 15-29 ml/min (HCC) 12/10/2017   Hypotension 12/10/2017   Paroxysmal atrial fibrillation ([place]) 11/07/2017   Bilateral carotid artery stenosis 09/03/2017   Malignant melanoma of skin of scalp ([facility]) 03/26/2017   Hyperlipidemia 02/08/2017   SAH (subarachnoid hemorrhage) ([facility]) 11/20/2016   Cerebrovascular accident (CVA) due to occlusion of left carotid artery ([place]) 11/20/2016    PAST MEDICAL HISTORY:  Active Ambulatory Problems    Diagnosis Date Noted   SAH (subarachnoid hemorrhage) ([facility]) 11/20/2016   Cerebrovascular accident (CVA) due to occlusion of left carotid artery ([facility]) 11/20/2016   Hyperlipidemia 02/08/2017   Malignant melanoma of skin of scalp ([place] [facility]) 03/26/2017   Bilateral carotid artery stenosis 09/03/2017   Paroxysmal atrial fibrillation ([facility]) 11/07/2017   CKD (chronic kidney disease) stage 4, GFR 15-29 ml/min (HCC) 12/10/2017   Hypotension 12/10/2017   Underweight 01/24/2018   TSH elevation 01/28/2018   Gastroesophageal reflux disease 02/18/2019   Anemia in stage 4 chronic kidney disease ([facility]) 05/21/2018   History of CVA (cerebrovascular accident) 01/31/2018   Iron deficiency 09/26/2018   Vitamin D deficiency 05/21/2018   Mass of colon    Adenocarcinoma ([facility]) 05/22/2019   Coronary artery calcification 05/25/2020   Cancer of transverse  colon ([place]) 08/06/2020   Protein-calorie malnutrition, severe 08/11/2020   Internal hernia 08/13/2020   Encounter for assessment for small bowel obstruciton 08/13/2020   Orthostatic hypotension 09/05/2020   Difficulty with speech    Benign esophageal stricture    Anemia 11/04/2020   Weakness 06/15/2021   Resolved Ambulatory Problems    Diagnosis Date Noted   Neoplasm of skin of scalp 11/20/2016   Gait disturbance    Subarachnoid hemorrhage (HCC)    Dysphagia, post-stroke    CKD (chronic kidney disease) stage 3, GFR 30-59 ml/min (HCC)    Hyperglycemia    Acute blood loss anemia    Hemiparesis affecting right side as late effect of cerebrovascular accident (CVA) ([facility]) 11/23/2016   Gait disturbance, post-stroke 11/23/2016   Left middle cerebral artery stroke ([facility]) 11/23/2016   Cataract of both eyes 02/08/2017   Carotid occlusion, left 02/08/2017   Carotid stenosis, asymptomatic, right 56/38/7564   Dentures complicating chewing 33/29/5188   Acute left ankle pain 06/27/2018   Symptomatic anemia 05/08/2019   Acute blood loss anemia 09/22/2020   Urinary tract infection without hematuria    Past Medical History:  Diagnosis Date   Cataract    Chronic kidney disease    Complication of anesthesia    Dysrhythmia    GERD (gastroesophageal reflux disease)    Glaucoma    History of hiatal hernia    Hypothyroidism    Hypothyroidism, postsurgical 11/20/2016   Melanoma ([facility])    Melanoma of scalp ([facility]) 04/23/2017   Obstructive uropathy    PONV (postoperative nausea and vomiting) 03/20/2017   Stroke ([facility]) 11/2016    SOCIAL HX:  Social History   Tobacco Use   Smoking status: Former    Packs/day: 2.00    Years: 32.00    Total pack years: 64.00    Types: Cigarettes    Quit date: 1983    Years since quitting: 40.8   Smokeless tobacco: Never   Tobacco comments:    No plans to start  Substance Use Topics   Alcohol use: No     ALLERGIES:  Allergies  Allergen Reactions    Penicillins Other (See Comments)    Has patient had a PCN reaction causing immediate rash, facial/tongue/throat swelling, SOB or lightheadedness with hypotension: Unknown  Has patient had a PCN reaction causing severe rash involving mucus membranes or skin necrosis: Unknown Has patient had a PCN reaction that required hospitalization: Unknown Has patient had a PCN reaction occurring within the last 10 years: No If all of the above answers are "NO", then may proceed with Cephalosporin use. Tolerated cephlasporins      PERTINENT MEDICATIONS:  Outpatient Encounter Medications as of 11/08/2021  Medication Sig   Acetaminophen Extra Strength 500 MG TABS TAKE ONE TABLET BY MOUTH EVERY 6 HOURS AS NEEDED   atorvastatin (LIPITOR) 40 MG tablet TAKE ONE TABLET BY MOUTH IN THE MORNING   cholecalciferol (VITAMIN D3) 25 MCG (1000 UNIT) tablet TAKE ONE TABLET BY MOUTH IN THE MORNING   Dapsone 5 % topical gel Apply 1 application. topically 2 (two) times a week.   ELIQUIS 2.5 MG TABS tablet TAKE ONE TABLET BY MOUTH IN THE MORNING AND IN THE EVENING   famotidine (PEPCID) 20 MG tablet TAKE ONE TABLET BY MOUTH IN THE MORNING AND IN THE EVENING   Ferrous Sulfate (IRON PO) Take 1 tablet by mouth daily.   finasteride (PROSCAR) 5 MG tablet Take 5 mg by mouth daily.   loperamide (IMODIUM) 2 MG capsule Take by mouth as needed for diarrhea or loose stools.   midodrine (PROAMATINE) 2.5 MG tablet Take 1 tablet (2.5 mg total) by mouth 2 (two) times daily with a meal.   timolol (TIMOPTIC) 0.5 % ophthalmic solution INSERT ONE DROP IN [facility] EYE IN THE MORNING AND IN THE EVENING   No facility-administered encounter medications on file as of 11/08/2021.    Thank you for the opportunity to participate in the care of Mr. Herendeen.  The palliative care team will continue to follow. Please call our office at [phone] if we can be of additional assistance.   Hollace Kinnier, DO  COVID-19 PATIENT SCREENING TOOL Asked and  negative response unless otherwise noted:  Have you had symptoms of covid, tested positive or been in contact with someone with symptoms/positive test in the past 5-10 days? no

## 2021-11-26 ENCOUNTER — Telehealth: Payer: Self-pay | Admitting: Oncology

## 2021-11-26 NOTE — Telephone Encounter (Signed)
Called patient regarding Dr. Hazeline Junker departure, I was unable to reschedule. Left a voicemail for patient to return. Reminded patient regarding December appointments.

## 2021-12-13 ENCOUNTER — Other Ambulatory Visit: Payer: Self-pay | Admitting: Family Medicine

## 2021-12-21 DIAGNOSIS — Z961 Presence of intraocular lens: Secondary | ICD-10-CM | POA: Diagnosis not present

## 2021-12-21 DIAGNOSIS — H401133 Primary open-angle glaucoma, bilateral, severe stage: Secondary | ICD-10-CM | POA: Diagnosis not present

## 2021-12-22 ENCOUNTER — Encounter: Payer: Self-pay | Admitting: Family Medicine

## 2021-12-22 ENCOUNTER — Ambulatory Visit (INDEPENDENT_AMBULATORY_CARE_PROVIDER_SITE_OTHER): Payer: Medicare Other | Admitting: Family Medicine

## 2021-12-22 VITALS — BP 114/79 | HR 56 | Ht 69.0 in | Wt 113.0 lb

## 2021-12-22 DIAGNOSIS — Z23 Encounter for immunization: Secondary | ICD-10-CM | POA: Diagnosis not present

## 2021-12-22 DIAGNOSIS — R531 Weakness: Secondary | ICD-10-CM | POA: Diagnosis not present

## 2021-12-22 NOTE — Assessment & Plan Note (Signed)
Waxing and waning spouts of weakness that are not associated with dizziness. Patient feels better now than at our last visit, and his anemia was stable at that time. As he will be seeing Dr. Alen Blew in a few weeks and he seems stable, we will defer checking labs until that visit. He is staying well hydrated and not having any dizzy episodes. Do wonder if mood (depressed moods during the fall/winter times) are contributing to his feeling of weakness.  - Return precautions given - Anemia evaluation with Dr. Alen Blew

## 2021-12-22 NOTE — Patient Instructions (Signed)
Everything is looking okay today, I will let Dr. Alen Blew get your labs as you seem to be doing pretty well and I know he will need to get them anyway.  We will give you your flu shot today, you can see if his office has the COVID-vaccine and see if you want to get it at that time.  If his office does not have it, he can always come back here next month and get your COVID-vaccine.  For your follow-up appointments, since were doing pretty good we can plan to do a 52-monthfollow-up unless something comes up.  If you are not feeling good or if you feel like you are getting dizzy and symptomatic again then just let our office know and we will see you.

## 2021-12-22 NOTE — Progress Notes (Signed)
    SUBJECTIVE:   CHIEF COMPLAINT / HPI:   Weakness - Has been feeling weaker in the last week - Still doing well compared to his previous episodes of weakness - Is working well with his granddaughter to make sure he stays hydrated - Not having any dizzy episodes   Vaccines - Is due for flu and COVID vaccines  PERTINENT  PMH / PSH: Reviewed  OBJECTIVE:   BP 114/79   Pulse (!) 56   Ht '5\' 9"'$  (1.753 m)   Wt 113 lb (51.3 kg)   SpO2 100%   BMI 16.69 kg/m   Gen: well-appearing, NAD, elderly male with daughter-in-law present CV: RRR, no m/r/g appreciated, no peripheral edema Pulm: CTAB, no wheezes/crackles GI: soft, non-tender, non-distended  ASSESSMENT/PLAN:   Weakness Waxing and waning spouts of weakness that are not associated with dizziness. Patient feels better now than at our last visit, and his anemia was stable at that time. As he will be seeing Dr. Alen Blew in a few weeks and he seems stable, we will defer checking labs until that visit. He is staying well hydrated and not having any dizzy episodes. Do wonder if mood (depressed moods during the fall/winter times) are contributing to his feeling of weakness.  - Return precautions given - Anemia evaluation with Dr. Alen Blew   Vaccines Flu vaccine today Defer COVID vaccine to either visit with Dr. Alen Blew or in a few weeks with our office as anticipate both vaccines together would make the patient feel much worse, which could affect his condition.    Rise Patience, [place]

## 2022-01-03 ENCOUNTER — Other Ambulatory Visit: Payer: Self-pay

## 2022-01-03 ENCOUNTER — Inpatient Hospital Stay: Payer: Medicare Other | Attending: Oncology

## 2022-01-03 ENCOUNTER — Inpatient Hospital Stay (HOSPITAL_BASED_OUTPATIENT_CLINIC_OR_DEPARTMENT_OTHER): Payer: Medicare Other | Admitting: Oncology

## 2022-01-03 VITALS — BP 120/41 | HR 56 | Temp 98.0°F | Resp 15 | Ht 69.0 in | Wt 109.3 lb

## 2022-01-03 DIAGNOSIS — Z85820 Personal history of malignant melanoma of skin: Secondary | ICD-10-CM | POA: Insufficient documentation

## 2022-01-03 DIAGNOSIS — C189 Malignant neoplasm of colon, unspecified: Secondary | ICD-10-CM | POA: Diagnosis not present

## 2022-01-03 DIAGNOSIS — C184 Malignant neoplasm of transverse colon: Secondary | ICD-10-CM | POA: Diagnosis not present

## 2022-01-03 DIAGNOSIS — D500 Iron deficiency anemia secondary to blood loss (chronic): Secondary | ICD-10-CM | POA: Insufficient documentation

## 2022-01-03 DIAGNOSIS — D649 Anemia, unspecified: Secondary | ICD-10-CM

## 2022-01-03 DIAGNOSIS — C434 Malignant melanoma of scalp and neck: Secondary | ICD-10-CM

## 2022-01-03 LAB — CMP (CANCER CENTER ONLY)
ALT: 8 U/L (ref 0–44)
AST: 14 U/L — ABNORMAL LOW (ref 15–41)
Albumin: 3.6 g/dL (ref 3.5–5.0)
Alkaline Phosphatase: 76 U/L (ref 38–126)
Anion gap: 8 (ref 5–15)
BUN: 28 mg/dL — ABNORMAL HIGH (ref 8–23)
CO2: 24 mmol/L (ref 22–32)
Calcium: 9.1 mg/dL (ref 8.9–10.3)
Chloride: 109 mmol/L (ref 98–111)
Creatinine: 2.68 mg/dL — ABNORMAL HIGH (ref 0.61–1.24)
GFR, Estimated: 23 mL/min — ABNORMAL LOW (ref >60)
Glucose, Bld: 104 mg/dL — ABNORMAL HIGH (ref 70–99)
Potassium: 4.7 mmol/L (ref 3.5–5.1)
Sodium: 141 mmol/L (ref 135–145)
Total Bilirubin: 0.4 mg/dL (ref 0.3–1.2)
Total Protein: 7.7 g/dL (ref 6.5–8.1)

## 2022-01-03 LAB — CBC WITH DIFFERENTIAL (CANCER CENTER ONLY)
Abs Immature Granulocytes: 0.03 10*3/uL (ref 0.00–0.07)
Basophils Absolute: 0 10*3/uL (ref 0.0–0.1)
Basophils Relative: 1 %
Eosinophils Absolute: 0.2 10*3/uL (ref 0.0–0.5)
Eosinophils Relative: 3 %
HCT: 33.5 % — ABNORMAL LOW (ref 39.0–52.0)
Hemoglobin: 10.6 g/dL — ABNORMAL LOW (ref 13.0–17.0)
Immature Granulocytes: 1 %
Lymphocytes Relative: 23 %
Lymphs Abs: 1.4 10*3/uL (ref 0.7–4.0)
MCH: 29.4 pg (ref 26.0–34.0)
MCHC: 31.6 g/dL (ref 30.0–36.0)
MCV: 92.8 fL (ref 80.0–100.0)
Monocytes Absolute: 0.6 10*3/uL (ref 0.1–1.0)
Monocytes Relative: 9 %
Neutro Abs: 4.1 10*3/uL (ref 1.7–7.7)
Neutrophils Relative %: 63 %
Platelet Count: 217 10*3/uL (ref 150–400)
RBC: 3.61 MIL/uL — ABNORMAL LOW (ref 4.22–5.81)
RDW: 13.4 % (ref 11.5–15.5)
WBC Count: 6.3 10*3/uL (ref 4.0–10.5)
nRBC: 0 % (ref 0.0–0.2)

## 2022-01-03 LAB — IRON AND IRON BINDING CAPACITY (CC-WL,HP ONLY)
Iron: 30 ug/dL — ABNORMAL LOW (ref 45–182)
Saturation Ratios: 9 % — ABNORMAL LOW (ref 17.9–39.5)
TIBC: 323 ug/dL (ref 250–450)
UIBC: 293 ug/dL (ref 117–376)

## 2022-01-03 LAB — FERRITIN: Ferritin: 25 ng/mL (ref 24–336)

## 2022-01-03 NOTE — Progress Notes (Signed)
Hematology and Oncology Follow Up   Keith [name] [id number] March 03, 1940 81 y.o. 01/03/2022 2:49 PM Keith Ser, DO      Principle Diagnosis: 81 year old man with colon cancer diagnosed in 2022.  He was found to have T3N0 colon cancer arising from the transverse colon with intact mismatch repair protein analysis.  He did not receive adjuvant therapy.  Secondary diagnoses:  1.  Stage IIC scalp melanoma diagnosed in 2019.     2.  Iron deficiency anemia related to chronic blood losses.    Prior Therapy:    He is status post excision of a scalp lesion and sentinel lymph node biopsy and a selective left neck dissection completed in April 2019.  He is status post robotic assisted right colectomy for transverse colon cancer diagnosed on August 06, 2020.  The final pathology showed invasive adenocarcinoma that is moderately differentiated with 0 out of 18 lymph nodes involved.   Current therapy: Active surveillance.      Interim History: Keith [name] returns today for repeat evaluation.  Since the last visit, he reports no major changes in his health.  He denies any GI complaints at this time.  Denies any nausea vomiting or abdominal pain.  He denies any weight loss or appetite changes.  He denies any hematochezia, melena or hemoptysis.  He does report periodic weakness and fatigue but overall not consistent and manageable.      Medications: Updated on review. Current Outpatient Medications  Medication Sig Dispense Refill   Acetaminophen Extra Strength 500 MG TABS TAKE ONE TABLET BY MOUTH EVERY 6 HOURS AS NEEDED 360 tablet 3   atorvastatin (LIPITOR) 40 MG tablet TAKE ONE TABLET BY MOUTH IN THE MORNING 90 tablet 3   cholecalciferol (VITAMIN D3) 25 MCG (1000 UNIT) tablet TAKE ONE TABLET BY MOUTH IN THE MORNING 90 tablet 3   Dapsone 5 % topical gel Apply 1 application. topically 2 (two) times a week.     ELIQUIS 2.5 MG TABS tablet TAKE ONE TABLET BY MOUTH IN THE  MORNING AND IN THE EVENING 180 tablet 3   famotidine (PEPCID) 20 MG tablet TAKE ONE TABLET BY MOUTH IN THE MORNING AND IN THE EVENING 180 tablet 3   Ferrous Sulfate (IRON PO) Take 1 tablet by mouth daily.     finasteride (PROSCAR) 5 MG tablet Take 5 mg by mouth daily.     loperamide (IMODIUM) 2 MG capsule Take by mouth as needed for diarrhea or loose stools.     midodrine (PROAMATINE) 2.5 MG tablet TAKE ONE TABLET BY MOUTH TWICE DAILY WITH MEALS 180 tablet 1   timolol (TIMOPTIC) 0.5 % ophthalmic solution INSERT ONE DROP IN EACH EYE IN THE MORNING AND IN THE EVENING 45 mL 3   No current facility-administered medications for this visit.     Allergies:  Allergies  Allergen Reactions   Penicillins Other (See Comments)    Has patient had a PCN reaction causing immediate rash, facial/tongue/throat swelling, SOB or lightheadedness with hypotension: Unknown Has patient had a PCN reaction causing severe rash involving mucus membranes or skin necrosis: Unknown Has patient had a PCN reaction that required hospitalization: Unknown Has patient had a PCN reaction occurring within the last 10 years: No If all of the above answers are "NO", then may proceed with Cephalosporin use. Tolerated cephlasporins   Physical exam:  Blood pressure (!) 120/41, pulse (!) 56, temperature 98 F (36.7 C), temperature source Temporal, resp. rate 15, height _0  (1.753 m),  weight 109 lb 4.8 oz (49.6 kg), SpO2 100 %.   ECOG 1   General appearance: Alert, awake without any distress. Head: Atraumatic without abnormalities Oropharynx: Without any thrush or ulcers. Eyes: No scleral icterus. Lymph nodes: No lymphadenopathy noted in the cervical, supraclavicular, or axillary nodes Heart:regular rate and rhythm, without any murmurs or gallops.   Lung: Clear to auscultation without any rhonchi, wheezes or dullness to percussion. Abdomin: Soft, nontender without any shifting dullness or ascites. Musculoskeletal: No  clubbing or cyanosis. Neurological: No motor or sensory deficits. Skin: Well-healed scalp lesion without any ulcers or nodularity.          Lab Results: Lab Results  Component Value Date   WBC 5.7 09/07/2021   HGB 10.4 (L) 09/07/2021   HCT 32.3 (L) 09/07/2021   MCV 89 09/07/2021   PLT 337 09/07/2021   PSA 0.82 Test Methodology: [employer] PSA 06/12/2010     Chemistry      Component Value Date/Time   NA 138 07/01/2021 1448   NA 137 02/03/2021 1601   K 5.1 07/01/2021 1448   CL 105 07/01/2021 1448   CO2 28 07/01/2021 1448   BUN 23 07/01/2021 1448   BUN 22 02/03/2021 1601   CREATININE 2.31 (H) 07/01/2021 1448      Component Value Date/Time   CALCIUM 9.5 07/01/2021 1448   CALCIUM 7.5 (L) 07/11/2010 0400   ALKPHOS 84 07/01/2021 1448   AST 12 (L) 07/01/2021 1448   ALT <5 07/01/2021 1448   BILITOT 0.3 07/01/2021 742        81 year old man with:       1.  Colon cancer diagnosed in July 2022.  He was found to have T3N0 colon cancer and did not receive adjuvant chemotherapy.  CT scan obtained in June 2023 did not show any evidence of metastatic disease.  The natural course of this disease and treatment choices were discussed at this time.  He is currently on active surveillance and I recommended obtaining imaging studies in June 2024.  Systemic therapy has been deferred given his overall frail status but would be reasonable to consider he has metastatic disease.    1.    Stage IIC melanoma of the scalp diagnosed in 2019.  He does not have any evidence of relapsed disease at this time.  Treatment options including surgical resection or immunotherapy could be considered if he has relapsed disease in the future.  3.  Iron deficiency anemia: Due to chronic GI blood losses.  His hemoglobin is up to 10.6 today with iron studies are currently pending.  I recommended continued oral supplements without any need for intravenous iron.  4.  Follow-up: In 6 months for a  follow-up visit.   30  minutes were dedicated to this visit.  The time was spent on updating disease status, treatment choices and outlining future plan of care discussion.  Zola Button, MD 01/03/2022 2:49 PM

## 2022-02-20 DIAGNOSIS — D220 Melanocytic nevi of lip: Secondary | ICD-10-CM | POA: Diagnosis not present

## 2022-02-20 DIAGNOSIS — Z85820 Personal history of malignant melanoma of skin: Secondary | ICD-10-CM | POA: Diagnosis not present

## 2022-03-16 ENCOUNTER — Other Ambulatory Visit: Payer: 59

## 2022-03-16 ENCOUNTER — Encounter: Payer: Self-pay | Admitting: Hematology

## 2022-03-16 ENCOUNTER — Telehealth: Payer: Self-pay

## 2022-03-16 DIAGNOSIS — Z515 Encounter for palliative care: Secondary | ICD-10-CM

## 2022-03-16 NOTE — Telephone Encounter (Signed)
(  10:10 am) PC SW attempted follow-up call with patient to assess his needs and comfort. SW left a voice message requesting a call back.

## 2022-03-20 NOTE — Progress Notes (Signed)
COMMUNITY PALLIATIVE CARE SW NOTE  PATIENT NAME: Keith [name] DOB: 1940-07-28 MRN: [id number]  PRIMARY CARE PROVIDER: Rise Patience, DO  RESPONSIBLE PARTY:  Acct ID - Guarantor Home Phone Work Phone Relationship Acct Type  [id number] JONQUEZ, [phone] Self P/F     [place] [address], [place], [place] [address]   Initial Palliative Care Encounter/Clinical Social Work  Initial PC SW telephonic encounter with patient to provide education regarding the palliative care program and obtain a status update. He stated that he was doing better. He has several appointments coming up. He is scheduled to have a full body scan in June. His appetite is good, but he reports that he is not gaining any weight. He has some congestion in his throat and some sinus drainage. He does not have any pain. Patient report that he had a fall 2/3 weeks ago with no injuries. He is on no o2 and he does not have shortness of breath. He is independent for all personal care, ADL's to include doing house chores.  Patient's granddaughter lives with him. He verbalized no immediate concerns.  Next scheduled visit is 04/21/22 @ 11am.  Social History   Tobacco Use   Smoking status: Former    Packs/day: 2.00    Years: 32.00    Total pack years: 64.00    Types: Cigarettes    Quit date: 47    Years since quitting: 41.2   Smokeless tobacco: Never   Tobacco comments:    No plans to start  Substance Use Topics   Alcohol use: No    CODE STATUS: DNR ADVANCED DIRECTIVES: Yes MOST FORM COMPLETE:  No HOSPICE EDUCATION PROVIDED: No  Duration of visit and documentation: 30 minutes.  [address] [place], [place]

## 2022-03-29 DIAGNOSIS — Z85820 Personal history of malignant melanoma of skin: Secondary | ICD-10-CM | POA: Diagnosis not present

## 2022-04-04 ENCOUNTER — Telehealth: Payer: Self-pay | Admitting: Family Medicine

## 2022-04-04 ENCOUNTER — Encounter: Payer: Self-pay | Admitting: Hematology

## 2022-04-04 NOTE — Telephone Encounter (Signed)
Contacted Keith [name] to schedule their annual wellness visit. Appointment made for 04/11/2022.  Thank you,  Keith [name] Direct dial  [phone]

## 2022-04-06 ENCOUNTER — Encounter: Payer: Self-pay | Admitting: Hematology

## 2022-04-10 NOTE — Progress Notes (Unsigned)
I connected with  Keith [name] on 04/11/2022 a video and audio enabled telemedicine application and verified that I am speaking with the correct person using two identifiers.  Patient Location: Home  Provider Location: Home Office  I discussed the limitations of evaluation and management by telemedicine. The patient expressed understanding and agreed to proceed.   Subjective:   Keith [name] is a 82 y.o. male who presents for Medicare Annual/Subsequent preventive examination.  Review of Systems    Per HPI unless specifically indicated below. Cardiac Risk Factors include: advanced age (>2men, >97 women);male gender, Hypotensive, Orthostatic Hypotension, and Hyperlipidemia.           Objective:       01/03/2022    3:13 PM 12/22/2021   10:06 AM 11/08/2021    2:41 PM  Vitals with BMI  Height 5\' 9"  5\' 9"    Weight 109 lbs 5 oz 113 lbs   BMI [id number]   Systolic [id number]  Diastolic 41 79 60  Pulse 56 56 62    There were no vitals filed for this visit. There is no height or weight on file to calculate BMI.     04/11/2022   12:00 PM 12/22/2021   10:10 AM 09/07/2021   11:16 AM 06/24/2021    9:21 AM 06/16/2021    4:55 AM 06/15/2021    8:57 AM 05/23/2021    9:39 AM  Advanced Directives  Does Patient Have a Medical Advance Directive? No No No Yes  No No  Type of Advance Directive    [place] in Chart?    Yes - validated most recent copy scanned in chart (See row information)     Would patient like information on creating a medical advance directive? No - Patient declined No - Patient declined No - Patient declined  No - Patient declined      Current Medications (verified) Outpatient Encounter Medications as of 04/11/2022  Medication Sig   Acetaminophen Extra Strength 500 MG TABS TAKE ONE TABLET BY MOUTH EVERY 6 HOURS AS NEEDED   atorvastatin (LIPITOR) 40 MG tablet TAKE ONE TABLET BY MOUTH IN THE MORNING    cholecalciferol (VITAMIN D3) 25 MCG (1000 UNIT) tablet TAKE ONE TABLET BY MOUTH IN THE MORNING   Dapsone 5 % topical gel Apply 1 application. topically 2 (two) times a week.   ELIQUIS 2.5 MG TABS tablet TAKE ONE TABLET BY MOUTH IN THE MORNING AND IN THE EVENING   famotidine (PEPCID) 20 MG tablet TAKE ONE TABLET BY MOUTH IN THE MORNING AND IN THE EVENING   Ferrous Sulfate (IRON PO) Take 1 tablet by mouth daily.   finasteride (PROSCAR) 5 MG tablet Take 5 mg by mouth daily.   loperamide (IMODIUM) 2 MG capsule Take by mouth as needed for diarrhea or loose stools.   timolol (TIMOPTIC) 0.5 % ophthalmic solution INSERT ONE DROP IN EACH EYE IN THE MORNING AND IN THE EVENING   midodrine (PROAMATINE) 2.5 MG tablet TAKE ONE TABLET BY MOUTH TWICE DAILY WITH MEALS (Patient not taking: Reported on 04/11/2022)   No facility-administered encounter medications on file as of 04/11/2022.    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   Melanoma ([place])    scalp   Melanoma of scalp ([name]) 04/23/2017   Obstructive uropathy    PONV (postoperative nausea and vomiting) 03/20/2017   Stroke ([place]) 11/2016   speech and right side at time of stroke 04/20/17- all function return to normal   Symptomatic anemia 05/08/2019   Underweight 01/24/2018   Urinary tract infection without hematuria    Past Surgical History:  Procedure Laterality Date   APPLICATION OF A-CELL OF HEAD/NECK N/A 04/23/2017    Procedure: APPLICATION OF INTREGRA;  Surgeon: Irene Limbo, MD;  Location: [place];  Service: Plastics;  Laterality: N/A;   BIOPSY  05/10/2019   Procedure: BIOPSY;  Surgeon: [name], MD;  Location: [facility] ENDOSCOPY;  Service: Endoscopy;;   BIOPSY  09/23/2020   Procedure: BIOPSY;  Surgeon: [name], MD;  Location: [facility] ENDOSCOPY;  Service: Endoscopy;;   BIOPSY  09/24/2020   Procedure: BIOPSY;  Surgeon: [name], MD;  Location: [place];  Service: Endoscopy;;   CATARACT EXTRACTION     COLONOSCOPY WITH PROPOFOL N/A 05/10/2019   Procedure: COLONOSCOPY WITH PROPOFOL;  Surgeon: [name], MD;  Location[facility] ENDOSCOPY;  Service: Endoscopy;  Laterality: N/A;   COLONOSCOPY WITH PROPOFOL N/A 09/24/2020   Procedure: COLONOSCOPY WITH PROPOFOL;  Surgeon: [name], MD;  Location: [facility] ENDOSCOPY;  Service: Endoscopy;  Laterality: N/A;   ESOPHAGOGASTRODUODENOSCOPY (EGD) WITH PROPOFOL N/A 05/10/2019   Procedure: ESOPHAGOGASTRODUODENOSCOPY (EGD) WITH PROPOFOL;  Surgeon: [name], MD;  Location: [facility] ENDOSCOPY;  Service: Endoscopy;  Laterality: N/A;   ESOPHAGOGASTRODUODENOSCOPY (EGD) WITH PROPOFOL N/A 09/23/2020   Procedure: ESOPHAGOGASTRODUODENOSCOPY (EGD) WITH PROPOFOL;  Surgeon: [name], MD;  Location: [facility] ENDOSCOPY;  Service: Endoscopy;  Laterality: N/A;   EXCISION MASS HEAD N/A 03/20/2017   Procedure: EXCISION scalp lesion;  Surgeon: Irene Limbo, MD;  Location: [place];  Service: Plastics;  Laterality: N/A;  EXCISION scalp lesion   EYE SURGERY     IR ANGIO INTRA EXTRACRAN SEL COM CAROTID INNOMINATE BILAT MOD SED  11/23/2016   IR ANGIO VERTEBRAL SEL VERTEBRAL UNI L MOD SED  11/23/2016   LOOP RECORDER INSERTION N/A 11/23/2016   Procedure: LOOP RECORDER INSERTION;  Surgeon: [name], MD;  Location: [place] CV LAB;  Service: Cardiovascular;  Laterality: N/A;   MASS EXCISION N/A 04/23/2017   Procedure: EXCISION MALIGNANT LESION OF SCALP;  Surgeon: Irene Limbo, MD;  Location: [place];  Service: Plastics;  Laterality: N/A;   MELANOMA EXCISION  04/23/2017   BILATERAL SUPERFICIAL PAROTIDECTOMY/NECK SENTINEL NODE BIOPSY WITH FROZEN SECTION; LEFT SELECTIVE NECK DISSECTION   PAROTIDECTOMY Bilateral 04/23/2017   Procedure: BILATERAL SUPERFICIAL PAROTIDECTOMY/NECK SENTINEL NODE BIOPSY WITH FROZEN SECTION; LEFT SELECTIVE NECK DISSECTION;  Surgeon: [name], MD;  Location: [place];  Service: ENT;  Laterality: Bilateral;   POLYPECTOMY  05/10/2019   Procedure: POLYPECTOMY;  Surgeon: [name], MD;  Location: [facility] ENDOSCOPY;  Service: Endoscopy;;   POLYPECTOMY  09/24/2020   Procedure: POLYPECTOMY;  Surgeon: [name], MD;  Location: [place];  Service: Endoscopy;;   SKIN GRAFT SPLIT THICKNESS HEAD / NECK  06/04/2017   SKIN GRAFT SPLIT THICKNESS TO SCALP FROM RIGHT  OR LEFT THIGH POSSIBLE A CELL TO DONOR SITE   SKIN SPLIT GRAFT N/A 06/04/2017   Procedure: SKIN GRAFT SPLIT THICKNESS TO SCALP FROM RIGHT OR LEFT THIGH POSSIBLE A CELL TO DONOR SITE;  Surgeon: Irene Limbo, MD;  Location: [place];  Service: Plastics;  Laterality: N/A;   SUBMUCOSAL TATTOO INJECTION  05/10/2019   Procedure: SUBMUCOSAL TATTOO INJECTION;  Surgeon: [name], MD;  Location: [facility] ENDOSCOPY;  Service: Endoscopy;;   THYROIDECTOMY     TONSILLECTOMY     Family History  Problem Relation Age of Onset   Lung cancer Father    Lung cancer Mother    Diabetes Brother    Colon cancer Neg Hx    Colon polyps Neg Hx    Esophageal cancer Neg Hx    Stomach cancer Neg Hx    Pancreatic cancer Neg Hx    Social History   Socioeconomic History   Marital status: Widowed    Spouse name: Not on file   Number of children: 1   Years of education: 80   Highest education level: 12th grade  Occupational History   Occupation: laborer    Comment: retired  Tobacco Use   Smoking status: Former    Packs/day: 2.00    Years: 32.00    Additional pack years: 0.00    Total pack years: 64.00    Types: Cigarettes     Quit date: 1983    Years since quitting: 41.2   Smokeless tobacco: Never   Tobacco comments:    No plans to start  Vaping Use   Vaping Use: Never used  Substance and Sexual Activity   Alcohol use: No   Drug use: No   Sexual activity: Not Currently  Other Topics Concern   Not on file  Social History Narrative   Patient lives alone in [place].    Patient has one son and a daughter in law, [name]. Patient is close with them.    Patient has a dog named "boo boo bear." Great joy for him.   Patient enjoys spending time with his family and walking his dog.          Social Determinants of Health   Financial Resource Strain: Low Risk  (04/11/2022)   Overall Financial Resource Strain (CARDIA)    Difficulty of Paying Living Expenses: Not very hard  Food Insecurity: No Food Insecurity (04/11/2022)   Hunger Vital Sign    Worried About Running Out of Food in the Last Year: Never true    Ran Out of Food in the Last Year: Never true  Transportation Needs: No Transportation Needs (04/11/2022)   PRAPARE - Hydrologist (Medical): No    Lack of Transportation (Non-Medical): No  Physical Activity: Insufficiently Active (04/11/2022)   Exercise Vital Sign    Days of Exercise per Week: 3 days    Minutes of Exercise per Session: 10 min  Stress: No Stress Concern Present (04/11/2022)   [place]    Feeling of Stress : Not at all  Social Connections: Moderately Isolated (04/11/2022)   Social Connection and Isolation Panel [NHANES]    Frequency of Communication with Friends and Family: More than three times a week    Frequency of Social Gatherings with Friends and Family: More than three times a week    Attends Religious Services: 1 to 4 times per year    Active Member of [employer] or Organizations: Patient unable  to answer    Attends Club or Organization Meetings: Never    Marital Status: Widowed    Tobacco  Counseling Counseling given: No Tobacco comments: No plans to start   Clinical Intake:  Pre-visit preparation completed: No  Pain : No/denies pain     Nutritional Status: BMI <19  Underweight Nutritional Risks: None Diabetes: No  How often do you need to have someone help you when you read instructions, pamphlets, or other written materials from your doctor or pharmacy?: 1 - Never  Diabetic? No  Interpreter Needed?: No  Information entered by :: Donnie Mesa, CMA   Activities of Daily Living    04/11/2022   11:43 AM 06/15/2021    8:00 PM  In your present state of health, do you have any difficulty performing the following activities:  Hearing? 1 1  Vision? 1 0  Difficulty concentrating or making decisions? 0 0  Walking or climbing stairs? 1 0  Dressing or bathing? 0 0  Doing errands, shopping? 0 0    Patient Care Team: Rise Patience, DO as PCP - General (Family Medicine) [name], MD (Inactive) as PCP - Electrophysiology (Cardiology) Nwobu, Lyman Bishop, MD as Consulting Physician [facility] Irene Limbo, MD as Consulting Physician (Plastic Surgery) Izora Ribas (Dermatology) Danella Sensing, MD as Consulting Physician (Dermatology) [name], MD as Consulting Physician (Oncology) Sherran Needs, NP as Nurse Practitioner (Nurse Practitioner) Hassell Done Dicky Doe, NP as Nurse Practitioner (Neurology) Frann Rider, NP as Nurse Practitioner (Neurology)  Indicate any recent Medical Services you may have received from other than [facility] providers in the past year (date may be approximate).     Assessment:   This is a routine wellness examination for Crews.  Hearing/Vision screening: Admits to some hearing issues in his Rt ear. Denies any change to her vision. Wear glasses. Annual Eye Exam.  Dietary issues and exercise activities discussed: Current Exercise Habits: Structured exercise class, Type of exercise: walking,  Time (Minutes): 10, Frequency (Times/Week): 3, Weekly Exercise (Minutes/Week): 30, Exercise limited by: orthopedic condition(s)   Goals Addressed   None    Depression Screen    04/11/2022   11:42 AM 12/22/2021   10:07 AM 09/07/2021   11:17 AM 06/24/2021    9:21 AM 05/23/2021    9:35 AM 02/03/2021    2:02 PM 10/20/2020   11:11 AM  PHQ 2/9 Scores  PHQ - 2 Score 0 0 0 1 0 3 2  PHQ- 9 Score  0 4 2 1 12 6     Fall Risk    04/11/2022   11:43 AM 12/22/2021   10:07 AM 09/07/2021   11:18 AM 06/24/2021    9:20 AM 10/05/2020   10:55 AM  Fall Risk   Falls in the past year? 0 0 1 1 0  Number falls in past yr: 0 0 0 1 0  Injury with Fall? 0 0 1 1 0  Risk for fall due to : No Fall Risks      Follow up Falls evaluation completed        FALL RISK PREVENTION PERTAINING TO THE HOME:  Any stairs in or around the home? No  If so, are there any without handrails? No  Home free of loose throw rugs in walkways, pet beds, electrical cords, etc? Yes  Adequate lighting in your home to reduce risk of falls? Yes   ASSISTIVE DEVICES UTILIZED TO PREVENT FALLS:  Life alert? No  Use of a cane, walker  or w/c? Yes  Grab bars in the bathroom? Yes  Shower chair or bench in shower? Yes  Elevated toilet seat or a handicapped toilet? Yes   TIMED UP AND GO:  Was the test performed?Unable to perform, virtual appointment   Cognitive Function:    01/23/2018    9:43 AM  MMSE - Mini Mental State Exam  Orientation to time 5  Orientation to Place 5  Registration 3  Attention/ Calculation 5  Recall 3  Language- name 2 objects 2  Language- repeat 1  Language- follow 3 step command 3  Language- read & follow direction 1  Write a sentence 1  Copy design 1  Total score 30        04/11/2022   11:58 AM 06/19/2019    1:56 PM 01/23/2018    9:43 AM  6CIT Screen  What Year? 0 points 0 points 0 points  What month? 0 points 0 points 0 points  What time? 0 points 0 points 0 points  Count back from 20 0 points 0 points  0 points  Months in reverse 0 points 0 points 0 points  Repeat phrase 0 points 0 points 0 points  Total Score 0 points 0 points 0 points    Immunizations Immunization History  Administered Date(s) Administered   Fluad Quad(high Dose 65+) 10/05/2020, 12/22/2021   Influenza,inj,Quad PF,6+ Mos 12/10/2017, 02/18/2019   PFIZER Comirnaty(Gray Top)Covid-19 Tri-Sucrose Vaccine 01/05/2020, 09/10/2020   PFIZER(Purple Top)SARS-COV-2 Vaccination 03/11/2019, 04/01/2019   PNEUMOCOCCAL CONJUGATE-20 02/03/2021   Pneumococcal Polysaccharide-23 02/18/2019   Tdap 06/24/2014    TDAP status: Up to date  Flu Vaccine status: Up to date  Pneumococcal vaccine status: Up to date  Covid-19 vaccine status: Information provided on how to obtain vaccines.   Qualifies for Shingles Vaccine? Yes   Zostavax completed No   Shingrix Completed?: No.    Education has been provided regarding the importance of this vaccine. Patient has been advised to call insurance company to determine out of pocket expense if they have not yet received this vaccine. Advised may also receive vaccine at local pharmacy or Health Dept. Verbalized acceptance and understanding.  Screening Tests Health Maintenance  Topic Date Due   Zoster Vaccines- Shingrix (1 of 2) Never done   COVID-19 Vaccine (5 - 2023-24 season) 09/16/2021   DTaP/Tdap/Td (2 - Td or Tdap) 06/23/2024   Pneumonia Vaccine 65+ Years old  Completed   INFLUENZA VACCINE  Completed   HPV VACCINES  Aged Out    Health Maintenance  Health Maintenance Due  Topic Date Due   Zoster Vaccines- Shingrix (1 of 2) Never done   COVID-19 Vaccine (5 - 2023-24 season) 09/16/2021    Colorectal cancer screening: No longer required.   Lung Cancer Screening: (Low Dose CT Chest recommended if Age 46-80 years, 30 pack-year currently smoking OR have quit w/in 15years.) does not qualify.   Lung Cancer Screening Referral: not applicable   Additional Screening:  Hepatitis C  Screening: does not qualify  Vision Screening: Recommended annual ophthalmology exams for early detection of glaucoma and other disorders of the eye. Is the patient up to date with their annual eye exam?  Yes  Who is the provider or what is the name of the office in which the patient attends annual eye exams?  If pt is not established with a provider, would they like to be referred to a provider to establish care? No .   Dental Screening: Recommended annual dental exams for proper oral  hygiene  Community Resource Referral / Chronic Care Management: CRR required this visit?  No   CCM required this visit?  No      Plan:     I have personally reviewed and noted the following in the patient's chart:   Medical and social history Use of alcohol, tobacco or illicit drugs  Current medications and supplements including opioid prescriptions. Patient is not currently taking opioid prescriptions. Functional ability and status Nutritional status Physical activity Advanced directives List of other physicians Hospitalizations, surgeries, and ER visits in previous 12 months Vitals Screenings to include cognitive, depression, and falls Referrals and appointments  In addition, I have reviewed and discussed with patient certain preventive protocols, quality metrics, and best practice recommendations. A written personalized care plan for preventive services as well as general preventive health recommendations were provided to patient.     Mr. Neels , Thank you for taking time to come for your Medicare Wellness Visit. I appreciate your ongoing commitment to your health goals. Please review the following plan we discussed and let me know if I can assist you in the future.   These are the goals we discussed:  Goals      Gain weight     Patient Stated     Regain health from stroke. Get new glasses and new dentures.     Patient Stated     Live life due to recent health events.          This is a list of the screening recommended for you and due dates:  Health Maintenance  Topic Date Due   Zoster (Shingles) Vaccine (1 of 2) Never done   COVID-19 Vaccine (5 - 2023-24 season) 09/16/2021   DTaP/Tdap/Td vaccine (2 - Td or Tdap) 06/23/2024   Pneumonia Vaccine  Completed   Flu Shot  Completed   HPV Vaccine  Aged Out    Wilson Singer, [place]   04/11/2022  Nurse Notes: Approximately 30 minute Non-Face -To-Face Medicare Wellness Visit

## 2022-04-10 NOTE — Patient Instructions (Signed)
Health Maintenance, Male Adopting a healthy lifestyle and getting preventive care are important in promoting health and wellness. Ask your health care provider about: The right schedule for you to have regular tests and exams. Things you can do on your own to prevent diseases and keep yourself healthy. What should I know about diet, weight, and exercise? Eat a healthy diet  Eat a diet that includes plenty of vegetables, fruits, low-fat dairy products, and lean protein. Do not eat a lot of foods that are high in solid fats, added sugars, or sodium. Maintain a healthy weight Body mass index (BMI) is a measurement that can be used to identify possible weight problems. It estimates body fat based on height and weight. Your health care provider can help determine your BMI and help you achieve or maintain a healthy weight. Get regular exercise Get regular exercise. This is one of the most important things you can do for your health. Most adults should: Exercise for at least 150 minutes each week. The exercise should increase your heart rate and make you sweat (moderate-intensity exercise). Do strengthening exercises at least twice a week. This is in addition to the moderate-intensity exercise. Spend less time sitting. Even light physical activity can be beneficial. Watch cholesterol and blood lipids Have your blood tested for lipids and cholesterol at 81 years of age, then have this test every 5 years. You may need to have your cholesterol levels checked more often if: Your lipid or cholesterol levels are high. You are older than 81 years of age. You are at high risk for heart disease. What should I know about cancer screening? Many types of cancers can be detected early and may often be prevented. Depending on your health history and family history, you may need to have cancer screening at various ages. This may include screening for: Colorectal cancer. Prostate cancer. Skin cancer. Lung  cancer. What should I know about heart disease, diabetes, and high blood pressure? Blood pressure and heart disease High blood pressure causes heart disease and increases the risk of stroke. This is more likely to develop in people who have high blood pressure readings or are overweight. Talk with your health care provider about your target blood pressure readings. Have your blood pressure checked: Every 3-5 years if you are 18-39 years of age. Every year if you are 40 years old or older. If you are between the ages of 65 and 75 and are a current or former smoker, ask your health care provider if you should have a one-time screening for abdominal aortic aneurysm (AAA). Diabetes Have regular diabetes screenings. This checks your fasting blood sugar level. Have the screening done: Once every three years after age 45 if you are at a normal weight and have a low risk for diabetes. More often and at a younger age if you are overweight or have a high risk for diabetes. What should I know about preventing infection? Hepatitis B If you have a higher risk for hepatitis B, you should be screened for this virus. Talk with your health care provider to find out if you are at risk for hepatitis B infection. Hepatitis C Blood testing is recommended for: Everyone born from 1945 through 1965. Anyone with known risk factors for hepatitis C. Sexually transmitted infections (STIs) You should be screened each year for STIs, including gonorrhea and chlamydia, if: You are sexually active and are younger than 81 years of age. You are older than 81 years of age and your   health care provider tells you that you are at risk for this type of infection. Your sexual activity has changed since you were last screened, and you are at increased risk for chlamydia or gonorrhea. Ask your health care provider if you are at risk. Ask your health care provider about whether you are at high risk for HIV. Your health care provider  may recommend a prescription medicine to help prevent HIV infection. If you choose to take medicine to prevent HIV, you should first get tested for HIV. You should then be tested every 3 months for as long as you are taking the medicine. Follow these instructions at home: Alcohol use Do not drink alcohol if your health care provider tells you not to drink. If you drink alcohol: Limit how much you have to 0-2 drinks a day. Know how much alcohol is in your drink. In the [place] one drink equals one 12 oz bottle of beer (355 mL), one 5 oz glass of wine (148 mL), or one 1 oz glass of hard liquor (44 mL). Lifestyle Do not use any products that contain nicotine or tobacco. These products include cigarettes, chewing tobacco, and vaping devices, such as e-cigarettes. If you need help quitting, ask your health care provider. Do not use street drugs. Do not share needles. Ask your health care provider for help if you need support or information about quitting drugs. General instructions Schedule regular health, dental, and eye exams. Stay current with your vaccines. Tell your health care provider if: You often feel depressed. You have ever been abused or do not feel safe at home. Summary Adopting a healthy lifestyle and getting preventive care are important in promoting health and wellness. Follow your health care provider's instructions about healthy diet, exercising, and getting tested or screened for diseases. Follow your health care provider's instructions on monitoring your cholesterol and blood pressure. This information is not intended to replace advice given to you by your health care provider. Make sure you discuss any questions you have with your health care provider. Document Revised: 05/24/2020 Document Reviewed: 05/24/2020 Elsevier Patient Education  2023 [employer]  

## 2022-04-11 ENCOUNTER — Ambulatory Visit (INDEPENDENT_AMBULATORY_CARE_PROVIDER_SITE_OTHER): Payer: 59

## 2022-04-11 DIAGNOSIS — Z0000 Encounter for general adult medical examination without abnormal findings: Secondary | ICD-10-CM | POA: Diagnosis not present

## 2022-04-21 ENCOUNTER — Other Ambulatory Visit: Payer: 59

## 2022-04-21 DIAGNOSIS — Z515 Encounter for palliative care: Secondary | ICD-10-CM

## 2022-04-21 NOTE — Progress Notes (Signed)
PATIENT NAME: Rowan Bouwkamp Warn DOB: March 12, 1940 MRN: [id number]  PRIMARY CARE PROVIDER: Evelena Leyden, DO  RESPONSIBLE PARTY:  Acct ID - Guarantor Home Phone Work Phone Relationship Acct Type  [id number] AAMARI, BENDANA* [phone] Self P/F     [address] [place], [place] [address]    Palliative Care Follow Up Encounter Note   I connected with  Khiree A Honsinger on 04/21/22 by telephone and verified that I am speaking with the correct person using two identifiers.    TODAY'S VISIT:    Respiratory: denies SOB; has an occasional non-productive cough  Cognitive: alert and oriented   Appetite: 2 reduced meals and snacks; uses [employer] Echo to remind him to drink water  GI/GU: has a BM daily; sometimes the urine stream is slow but no other issues    Mobility: pt reports he doesn't do the PT exercise but he gets outside and walks  ADLs: independent   Sleeping Pattern: sleeps throughout the night; he goes to bed at 10pm and awakens at 7am  Pain: denies pain   Fall: denies falls   Palliative Care/ Hospice: LPN explained role and purpose of palliative care including visit frequency.   Goals of Care: To stay in the home and be independent as long as possible     CODE STATUS: DNR  ADVANCED DIRECTIVES: N MOST FORM: N PPS: 80%   PHYSICAL EXAM:   VITALS:There were no vitals filed for this visit.     Pt and LPN discussed ongoing services and both agree that pt does not meet Palliative Care criteria at this time.    Keilly Fatula Clementeen Graham, LPN

## 2022-04-26 DIAGNOSIS — H401133 Primary open-angle glaucoma, bilateral, severe stage: Secondary | ICD-10-CM | POA: Diagnosis not present

## 2022-04-26 DIAGNOSIS — Z961 Presence of intraocular lens: Secondary | ICD-10-CM | POA: Diagnosis not present

## 2022-06-14 ENCOUNTER — Telehealth: Payer: Self-pay | Admitting: Hematology

## 2022-06-14 NOTE — Telephone Encounter (Signed)
Left a voicemail for patient's rescheduled appointment.

## 2022-06-15 ENCOUNTER — Other Ambulatory Visit: Payer: Self-pay

## 2022-06-16 MED ORDER — MIDODRINE HCL 2.5 MG PO TABS: 2.5000 mg | ORAL_TABLET | Freq: Two times a day (BID) | ORAL | 1 refills | Status: DC

## 2022-06-23 MED ORDER — MIDODRINE HCL 2.5 MG PO TABS: 2.5000 mg | ORAL_TABLET | Freq: Two times a day (BID) | ORAL | 1 refills | Status: DC

## 2022-06-23 NOTE — Addendum Note (Signed)
Addended by: Veronda Prude on: 06/23/2022 04:58 PM   Modules accepted: Orders

## 2022-06-23 NOTE — Telephone Encounter (Signed)
Received phone call from [facility] pharmacy. They package all of patient's medications.   [facility]. Resent prescription to [facility] pharmacy.   Veronda Prude, RN

## 2022-07-04 ENCOUNTER — Other Ambulatory Visit: Payer: Self-pay

## 2022-07-04 DIAGNOSIS — C189 Malignant neoplasm of colon, unspecified: Secondary | ICD-10-CM

## 2022-07-05 ENCOUNTER — Other Ambulatory Visit: Payer: Self-pay

## 2022-07-05 ENCOUNTER — Inpatient Hospital Stay: Payer: 59 | Attending: Nurse Practitioner

## 2022-07-05 ENCOUNTER — Ambulatory Visit (HOSPITAL_COMMUNITY)
Admission: RE | Admit: 2022-07-05 | Discharge: 2022-07-05 | Disposition: A | Discharge status: Home / Self Care | Payer: 59 | Source: Ambulatory Visit | Attending: Oncology | Admitting: Oncology

## 2022-07-05 DIAGNOSIS — C189 Malignant neoplasm of colon, unspecified: Secondary | ICD-10-CM

## 2022-07-05 DIAGNOSIS — I2510 Atherosclerotic heart disease of native coronary artery without angina pectoris: Secondary | ICD-10-CM | POA: Diagnosis not present

## 2022-07-05 DIAGNOSIS — Z85820 Personal history of malignant melanoma of skin: Secondary | ICD-10-CM | POA: Diagnosis not present

## 2022-07-05 DIAGNOSIS — I700 Atherosclerosis of aorta: Secondary | ICD-10-CM | POA: Insufficient documentation

## 2022-07-05 DIAGNOSIS — Z9049 Acquired absence of other specified parts of digestive tract: Secondary | ICD-10-CM | POA: Insufficient documentation

## 2022-07-05 DIAGNOSIS — Z85038 Personal history of other malignant neoplasm of large intestine: Secondary | ICD-10-CM | POA: Insufficient documentation

## 2022-07-05 LAB — CMP (CANCER CENTER ONLY)
ALT: 5 U/L (ref 0–44)
AST: 12 U/L — ABNORMAL LOW (ref 15–41)
Albumin: 3.6 g/dL (ref 3.5–5.0)
Alkaline Phosphatase: 68 U/L (ref 38–126)
Anion gap: 3 — ABNORMAL LOW (ref 5–15)
BUN: 23 mg/dL (ref 8–23)
CO2: 28 mmol/L (ref 22–32)
Calcium: 9.6 mg/dL (ref 8.9–10.3)
Chloride: 108 mmol/L (ref 98–111)
Creatinine: 3.03 mg/dL — ABNORMAL HIGH (ref 0.61–1.24)
GFR, Estimated: 20 mL/min — ABNORMAL LOW (ref >60)
Glucose, Bld: 98 mg/dL (ref 70–99)
Potassium: 4.8 mmol/L (ref 3.5–5.1)
Sodium: 139 mmol/L (ref 135–145)
Total Bilirubin: 0.5 mg/dL (ref 0.3–1.2)
Total Protein: 6.6 g/dL (ref 6.5–8.1)

## 2022-07-05 LAB — CBC WITH DIFFERENTIAL (CANCER CENTER ONLY)
Abs Immature Granulocytes: 0.01 10*3/uL (ref 0.00–0.07)
Basophils Absolute: 0 10*3/uL (ref 0.0–0.1)
Basophils Relative: 1 %
Eosinophils Absolute: 0.2 10*3/uL (ref 0.0–0.5)
Eosinophils Relative: 3 %
HCT: 35.3 % — ABNORMAL LOW (ref 39.0–52.0)
Hemoglobin: 11 g/dL — ABNORMAL LOW (ref 13.0–17.0)
Immature Granulocytes: 0 %
Lymphocytes Relative: 24 %
Lymphs Abs: 1.2 10*3/uL (ref 0.7–4.0)
MCH: 29.3 pg (ref 26.0–34.0)
MCHC: 31.2 g/dL (ref 30.0–36.0)
MCV: 93.9 fL (ref 80.0–100.0)
Monocytes Absolute: 0.5 10*3/uL (ref 0.1–1.0)
Monocytes Relative: 9 %
Neutro Abs: 3 10*3/uL (ref 1.7–7.7)
Neutrophils Relative %: 63 %
Platelet Count: 180 10*3/uL (ref 150–400)
RBC: 3.76 MIL/uL — ABNORMAL LOW (ref 4.22–5.81)
RDW: 13.7 % (ref 11.5–15.5)
WBC Count: 4.9 10*3/uL (ref 4.0–10.5)
nRBC: 0 % (ref 0.0–0.2)

## 2022-07-09 ENCOUNTER — Encounter: Payer: Self-pay | Admitting: Family Medicine

## 2022-07-12 ENCOUNTER — Ambulatory Visit: Payer: Medicare Other | Admitting: Hematology

## 2022-07-23 NOTE — Progress Notes (Unsigned)
Patient Care Team: Evette Georges, MD as PCP - General (Family Medicine) Hillis Range, MD (Inactive) as PCP - Electrophysiology (Cardiology) Nwobu, Alpha Gula, MD as Consulting Physician [facility] Glenna Fellows, MD as Consulting Physician (Plastic Surgery) Ellen Henri (Dermatology) Arminda Resides, MD as Consulting Physician (Dermatology) Benjiman Core, MD (Inactive) as Consulting Physician (Oncology) Newman Nip, NP (Inactive) as Nurse Practitioner (Nurse Practitioner) Daphine Deutscher [place], NP as Nurse Practitioner (Neurology) Ihor Austin, NP as Nurse Practitioner (Neurology)   CHIEF COMPLAINT: Follow up colon cancer and h/o melanoma   Oncology History   No history exists.     CURRENT THERAPY: Surveillance  HISTORY OF PRESENT ILLNESS: Keith [name] presents for first follow up with [place], previously followed by Dr. Clelia Croft with PMH including SAH, CVA, bilateral carotid artery stenosis, HL, Afib, CKD, anemia, stage IIC melanoma of the scalp in 2019 and transverse colon cancer. He initially developed rectal bleeding with symptomatic IDA/acute on chronic anemia in 2021, work up colonoscopy 04/2019 by Dr. Chales Abrahams showed a malignant appearing lesion in the transverse colon and multiple polyps, path from the transverse colon mass and the sigmoid polypectomy (complete removal) showed synchronous adenocarcinomas. Staging CT CAP were negative for distant metastasis. He saw Dr. Maisie Fus who recommended surgical resection on several visits, he was felt to be a high risk candidate. He eventually underwent robot assisted right colectomy 08/06/20; path showed ***. Adjuvant chemotherapy was not recommended. He was readmitted for post op bowel obstruction 7/29 - 08/19/20 and for acute anemia and syncope 09/22/20 - ***. He has been on surveillance, most recent imaging 07/05/22.   INTERVAL HISTORY Keith [name] returns for follow up, last seen by Dr. Clelia Croft 12/2021.   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   Melanoma (HCC)    scalp   Melanoma of scalp (HCC) 04/23/2017   Obstructive uropathy    PONV (postoperative nausea and vomiting) 03/20/2017   Stroke (HCC) 11/2016   speech and right side at time of stroke 04/20/17- all function return to normal   Symptomatic anemia 05/08/2019   Underweight 01/24/2018   Urinary tract infection without hematuria      Past Surgical History:  Procedure Laterality Date   APPLICATION OF A-CELL OF HEAD/NECK N/A 04/23/2017   Procedure: APPLICATION OF INTREGRA;  Surgeon: Glenna Fellows, MD;  Location: [facility] OR;  Service: Plastics;  Laterality: N/A;   BIOPSY  05/10/2019   Procedure: BIOPSY;  Surgeon: Lynann Bologna, MD;  Location: [facility] ENDOSCOPY;  Service: Endoscopy;;   BIOPSY  09/23/2020   Procedure: BIOPSY;  Surgeon: Lynann Bologna, MD;  Location: [facility] ENDOSCOPY;  Service: Endoscopy;;   BIOPSY  09/24/2020   Procedure: BIOPSY;  Surgeon: Lynann Bologna, MD;  Location: [facility] ENDOSCOPY;  Service: Endoscopy;;   CATARACT EXTRACTION     COLONOSCOPY WITH PROPOFOL N/A 05/10/2019   Procedure: COLONOSCOPY WITH PROPOFOL;  Surgeon: Lynann Bologna, MD;  Location: [facility] ENDOSCOPY;  Service: Endoscopy;  Laterality: N/A;   COLONOSCOPY WITH PROPOFOL N/A 09/24/2020   Procedure: COLONOSCOPY WITH PROPOFOL;  Surgeon: Lynann Bologna, MD;  Location: [facility] ENDOSCOPY;  Service: Endoscopy;  Laterality: N/A;   ESOPHAGOGASTRODUODENOSCOPY (EGD) WITH PROPOFOL N/A 05/10/2019   Procedure:  ESOPHAGOGASTRODUODENOSCOPY (EGD) WITH PROPOFOL;  Surgeon: Lynann Bologna, MD;  Location: [facility] ENDOSCOPY;  Service: Endoscopy;  Laterality: N/A;   ESOPHAGOGASTRODUODENOSCOPY (EGD) WITH PROPOFOL N/A 09/23/2020   Procedure: ESOPHAGOGASTRODUODENOSCOPY (EGD) WITH PROPOFOL;  Surgeon: Lynann Bologna, MD;  Location: [facility] ENDOSCOPY;  Service: Endoscopy;  Laterality: N/A;   EXCISION MASS HEAD N/A 03/20/2017   Procedure: EXCISION scalp lesion;  Surgeon: Glenna Fellows, MD;  Location: [facility] OR;  Service: Plastics;  Laterality: N/A;  EXCISION scalp lesion   EYE SURGERY     IR ANGIO INTRA EXTRACRAN SEL COM CAROTID INNOMINATE BILAT MOD SED  11/23/2016   IR ANGIO VERTEBRAL SEL VERTEBRAL UNI L MOD SED  11/23/2016   LOOP RECORDER INSERTION N/A 11/23/2016   Procedure: LOOP RECORDER INSERTION;  Surgeon: Hillis Range, MD;  Location: [facility] INVASIVE CV LAB;  Service: Cardiovascular;  Laterality: N/A;   MASS EXCISION N/A 04/23/2017   Procedure: EXCISION MALIGNANT LESION OF SCALP;  Surgeon: Glenna Fellows, MD;  Location: [facility] OR;  Service: Plastics;  Laterality: N/A;   MELANOMA EXCISION  04/23/2017   BILATERAL SUPERFICIAL PAROTIDECTOMY/NECK SENTINEL NODE BIOPSY WITH FROZEN SECTION; LEFT SELECTIVE NECK DISSECTION   PAROTIDECTOMY Bilateral 04/23/2017   Procedure: BILATERAL SUPERFICIAL PAROTIDECTOMY/NECK SENTINEL NODE BIOPSY WITH FROZEN SECTION; LEFT SELECTIVE NECK DISSECTION;  Surgeon: Serena Colonel, MD;  Location[facility] OR;  Service: ENT;  Laterality: Bilateral;   POLYPECTOMY  05/10/2019   Procedure: POLYPECTOMY;  Surgeon: Lynann Bologna, MD;  Location: [facility] ENDOSCOPY;  Service: Endoscopy;;   POLYPECTOMY  09/24/2020   Procedure: POLYPECTOMY;  Surgeon: Lynann Bologna, MD;  Location: [facility] ENDOSCOPY;  Service: Endoscopy;;   SKIN GRAFT SPLIT THICKNESS HEAD / NECK  06/04/2017   SKIN GRAFT SPLIT THICKNESS TO SCALP FROM RIGHT OR LEFT THIGH POSSIBLE A CELL TO DONOR SITE   SKIN SPLIT GRAFT N/A 06/04/2017   Procedure: SKIN GRAFT SPLIT THICKNESS TO SCALP FROM  RIGHT OR LEFT THIGH POSSIBLE A CELL TO DONOR SITE;  Surgeon: Glenna Fellows, MD;  Location: [facility] OR;  Service: Plastics;  Laterality: N/A;   SUBMUCOSAL TATTOO INJECTION  05/10/2019   Procedure: SUBMUCOSAL TATTOO INJECTION;  Surgeon: Lynann Bologna, MD;  Location: [facility] ENDOSCOPY;  Service: Endoscopy;;   THYROIDECTOMY     TONSILLECTOMY       Outpatient Encounter Medications as of 07/24/2022  Medication Sig   Acetaminophen Extra Strength 500 MG TABS TAKE ONE TABLET BY MOUTH EVERY 6 HOURS AS NEEDED   atorvastatin (LIPITOR) 40 MG tablet TAKE ONE TABLET BY MOUTH IN THE MORNING   cholecalciferol (VITAMIN D3) 25 MCG (1000 UNIT) tablet TAKE ONE TABLET BY MOUTH IN THE MORNING   Dapsone 5 % topical gel Apply 1 application. topically 2 (two) times a week.   ELIQUIS 2.5 MG TABS tablet TAKE ONE TABLET BY MOUTH IN THE MORNING AND IN THE EVENING   famotidine (PEPCID) 20 MG tablet TAKE ONE TABLET BY MOUTH IN THE MORNING AND IN THE EVENING   Ferrous Sulfate (IRON PO) Take 1 tablet by mouth daily.   finasteride (PROSCAR) 5 MG tablet Take 5 mg by mouth daily.   loperamide (IMODIUM) 2 MG capsule Take by mouth as needed for diarrhea or loose stools.   midodrine (PROAMATINE) 2.5 MG tablet Take 1 tablet (2.5 mg total) by  mouth 2 (two) times daily with a meal.   timolol (TIMOPTIC) 0.5 % ophthalmic solution INSERT ONE DROP IN [facility] EYE IN THE MORNING AND IN THE EVENING   No facility-administered encounter medications on file as of 07/24/2022.     There were no vitals filed for this visit. There is no height or weight on file to calculate BMI.   PHYSICAL EXAM GENERAL:alert, no distress and comfortable SKIN: no rash  EYES: sclera clear NECK: without mass LYMPH:  no palpable cervical or supraclavicular lymphadenopathy  LUNGS: clear with normal breathing effort HEART: regular rate & rhythm, no lower extremity edema ABDOMEN: abdomen soft, non-tender and normal bowel sounds NEURO: alert & oriented x 3 with fluent  speech, no focal motor/sensory deficits Breast exam:  PAC without erythema    CBC    Component Value Date/Time   WBC 4.9 07/05/2022 0831   WBC 8.0 06/16/2021 0314   RBC 3.76 (L) 07/05/2022 0831   HGB 11.0 (L) 07/05/2022 0831   HGB 10.4 (L) 09/07/2021 1149   HCT 35.3 (L) 07/05/2022 0831   HCT 32.3 (L) 09/07/2021 1149   PLT 180 07/05/2022 0831   PLT 337 09/07/2021 1149   MCV 93.9 07/05/2022 0831   MCV 89 09/07/2021 1149   MCH 29.3 07/05/2022 0831   MCHC 31.2 07/05/2022 0831   RDW 13.7 07/05/2022 0831   RDW 13.3 09/07/2021 1149   LYMPHSABS 1.2 07/05/2022 0831   MONOABS 0.5 07/05/2022 0831   EOSABS 0.2 07/05/2022 0831   BASOSABS 0.0 07/05/2022 0831     CMP     Component Value Date/Time   NA 139 07/05/2022 0831   NA 137 02/03/2021 1601   K 4.8 07/05/2022 0831   CL 108 07/05/2022 0831   CO2 28 07/05/2022 0831   GLUCOSE 98 07/05/2022 0831   BUN 23 07/05/2022 0831   BUN 22 02/03/2021 1601   CREATININE 3.03 (H) 07/05/2022 0831   CALCIUM 9.6 07/05/2022 0831   CALCIUM 7.5 (L) 07/11/2010 0400   PROT 6.6 07/05/2022 0831   PROT 7.1 02/03/2021 1601   ALBUMIN 3.6 07/05/2022 0831   ALBUMIN 3.8 02/03/2021 1601   AST 12 (L) 07/05/2022 0831   ALT 5 07/05/2022 0831   ALKPHOS 68 07/05/2022 0831   BILITOT 0.5 07/05/2022 0831   GFRNONAA 20 (L) 07/05/2022 0831   GFRAA 28 (L) 09/26/2019 1313     ASSESSMENT & PLAN:  PLAN:  No orders of the defined types were placed in this encounter.     All questions were answered. The patient knows to call the clinic with any problems, questions or concerns. No barriers to learning were detected. I spent *** counseling the patient face to face. The total time spent in the appointment was *** and more than 50% was on counseling, review of test results, and coordination of care.   Santiago Glad, NP-C @DATE @

## 2022-07-24 ENCOUNTER — Encounter: Payer: Self-pay | Admitting: Nurse Practitioner

## 2022-07-24 ENCOUNTER — Other Ambulatory Visit: Payer: Self-pay

## 2022-07-24 ENCOUNTER — Inpatient Hospital Stay: Payer: 59 | Attending: Nurse Practitioner | Admitting: Nurse Practitioner

## 2022-07-24 VITALS — BP 113/53 | HR 48 | Temp 97.5°F | Resp 16 | Wt 108.0 lb

## 2022-07-24 DIAGNOSIS — Z85038 Personal history of other malignant neoplasm of large intestine: Secondary | ICD-10-CM | POA: Insufficient documentation

## 2022-07-24 DIAGNOSIS — C434 Malignant melanoma of scalp and neck: Secondary | ICD-10-CM | POA: Diagnosis not present

## 2022-07-24 DIAGNOSIS — I2510 Atherosclerotic heart disease of native coronary artery without angina pectoris: Secondary | ICD-10-CM | POA: Insufficient documentation

## 2022-07-24 DIAGNOSIS — C189 Malignant neoplasm of colon, unspecified: Secondary | ICD-10-CM

## 2022-07-24 DIAGNOSIS — Z85820 Personal history of malignant melanoma of skin: Secondary | ICD-10-CM | POA: Diagnosis not present

## 2022-07-24 DIAGNOSIS — E785 Hyperlipidemia, unspecified: Secondary | ICD-10-CM | POA: Diagnosis not present

## 2022-07-24 DIAGNOSIS — Z7901 Long term (current) use of anticoagulants: Secondary | ICD-10-CM | POA: Diagnosis not present

## 2022-07-24 DIAGNOSIS — N189 Chronic kidney disease, unspecified: Secondary | ICD-10-CM | POA: Diagnosis not present

## 2022-07-24 DIAGNOSIS — I4891 Unspecified atrial fibrillation: Secondary | ICD-10-CM | POA: Insufficient documentation

## 2022-07-24 NOTE — Addendum Note (Signed)
Addended by: Pollyann Samples on: 07/24/2022 05:36 PM   Modules accepted: Orders

## 2022-07-26 IMAGING — CT CT CHEST W/O CM
2 of 4 series · 13 of 36 positions shown, 16 images · non-contrast
Comparison: CT 09/09/2019

CLINICAL DATA: Restaging transverse colon cancer.

EXAM:
CT CHEST, ABDOMEN AND PELVIS WITHOUT CONTRAST
TECHNIQUE: Multidetector CT imaging of the chest, abdomen and pelvis was
performed following the standard protocol without IV contrast.

[Series 2: cap w/o · axial · non-contrast · 0.78mm/px · z∈[-627,-87]mm · 10 of 132 slices shown, 13 images]
[im 12/132  mediastinal]
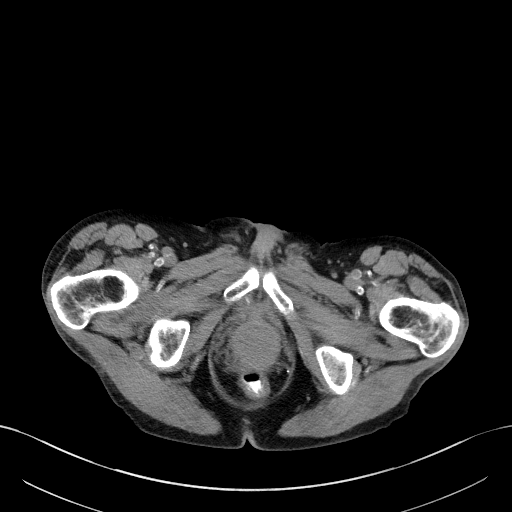
[im 12/132  lung]
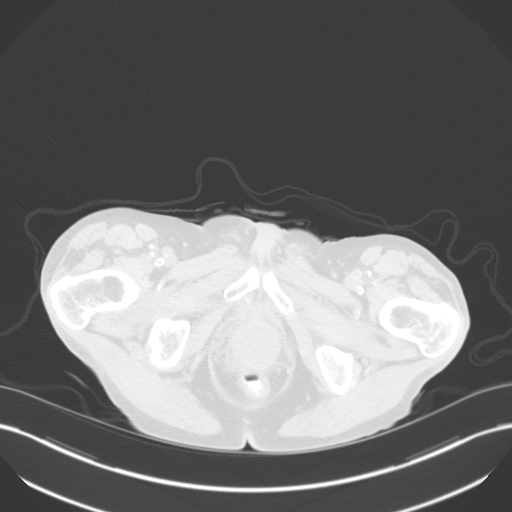
[im 24/132  lung]
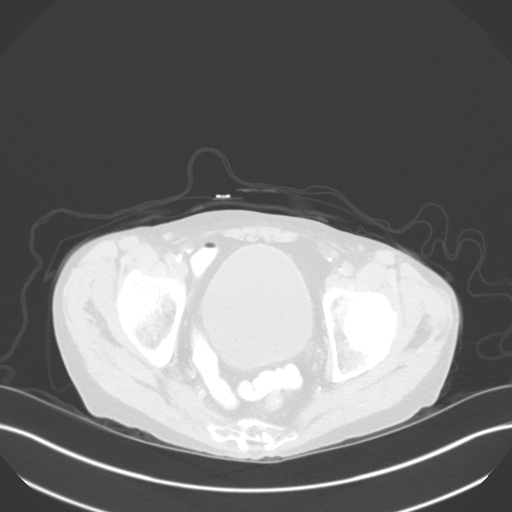
[im 36/132  lung]
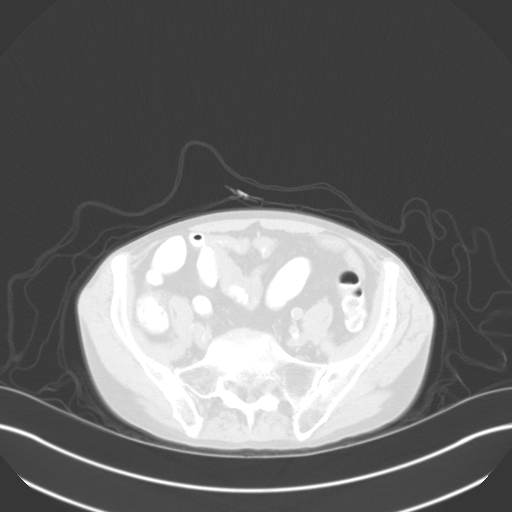
[im 48/132  lung]
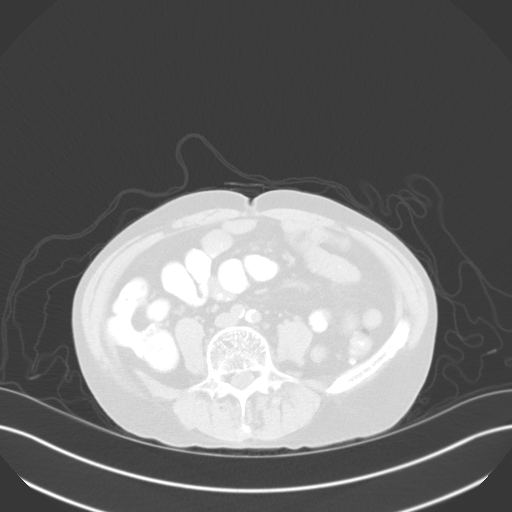
[im 60/132  mediastinal]
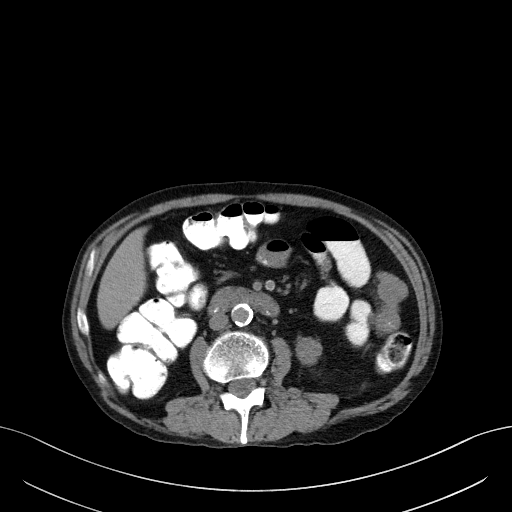
[im 60/132  lung]
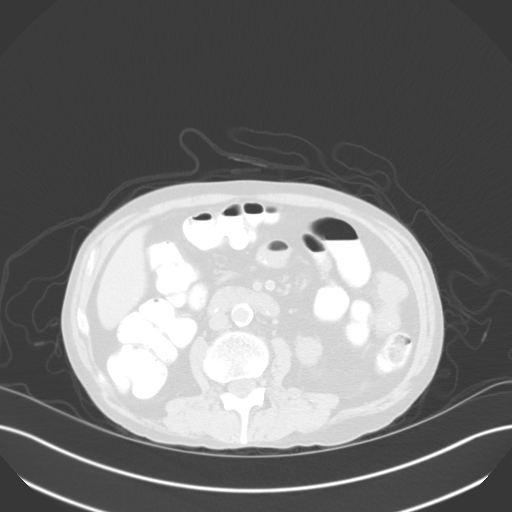
[im 72/132  lung]
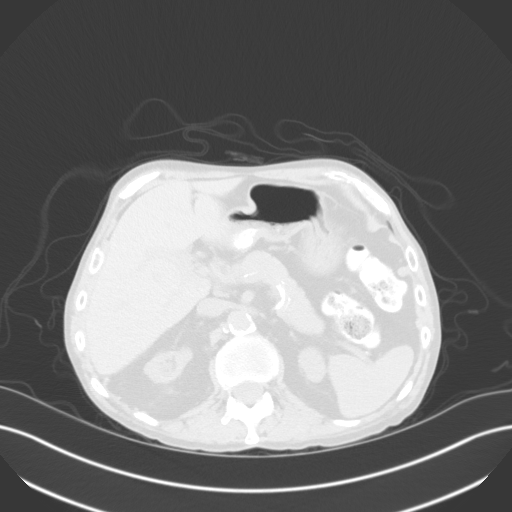
[im 84/132  lung]
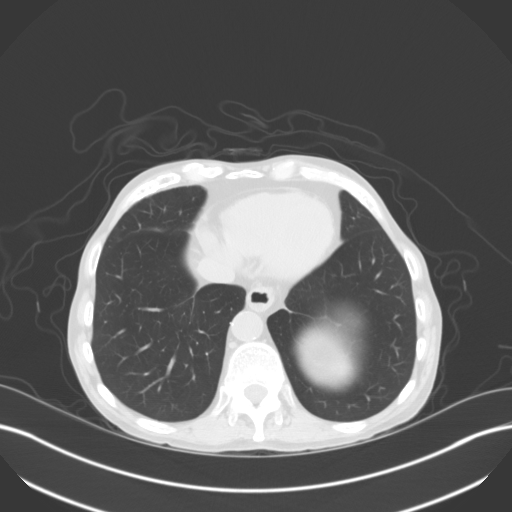
[im 96/132  lung]
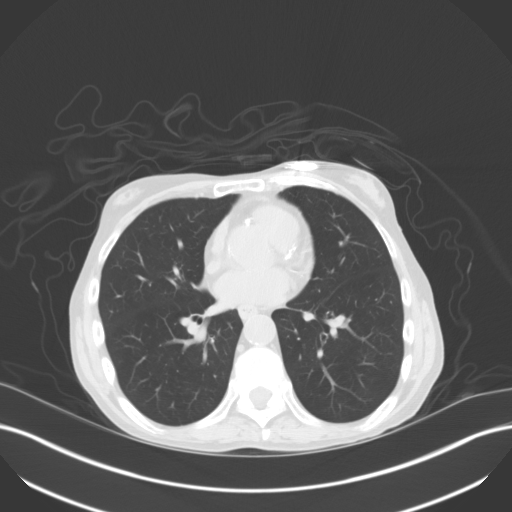
[im 108/132  mediastinal]
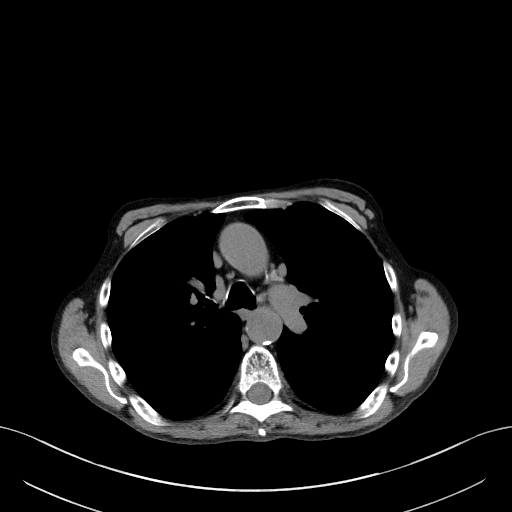
[im 108/132  lung]
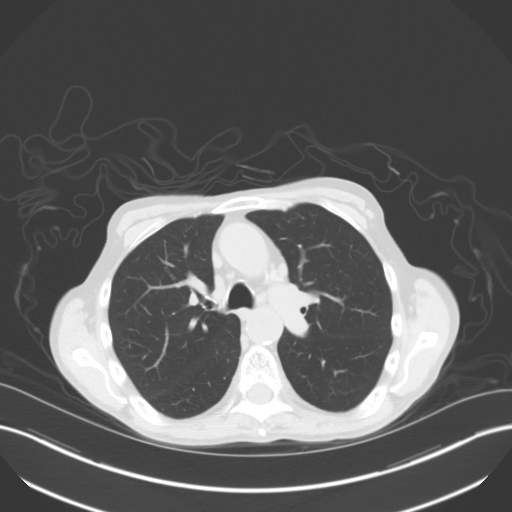
[im 120/132  lung]
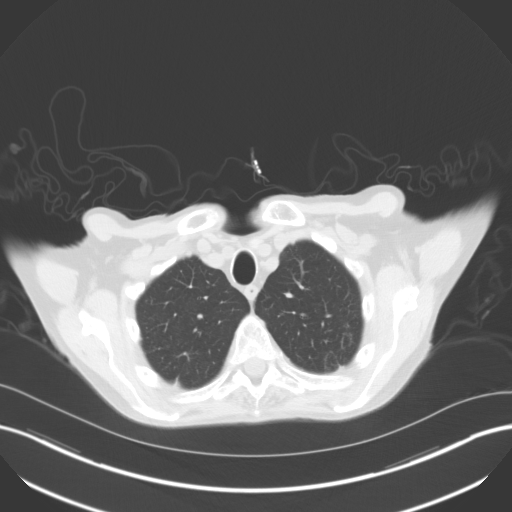

[Series 4: coronals · coronal · 0.86mm/px · 3 of 117 slices shown]
[im 24/117  lung]
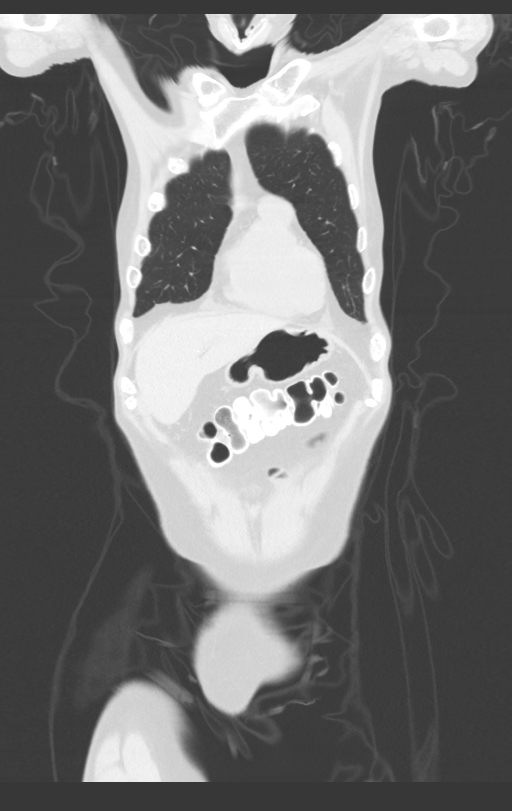
[im 47/117  lung]
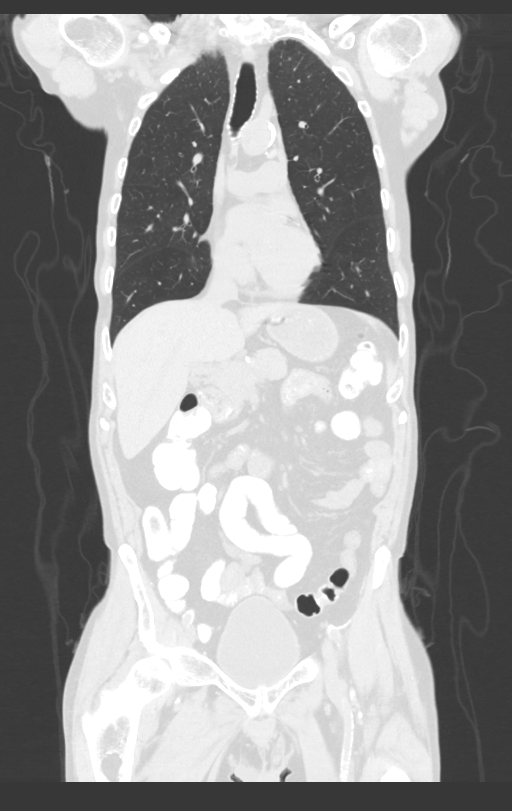
[im 70/117  lung]
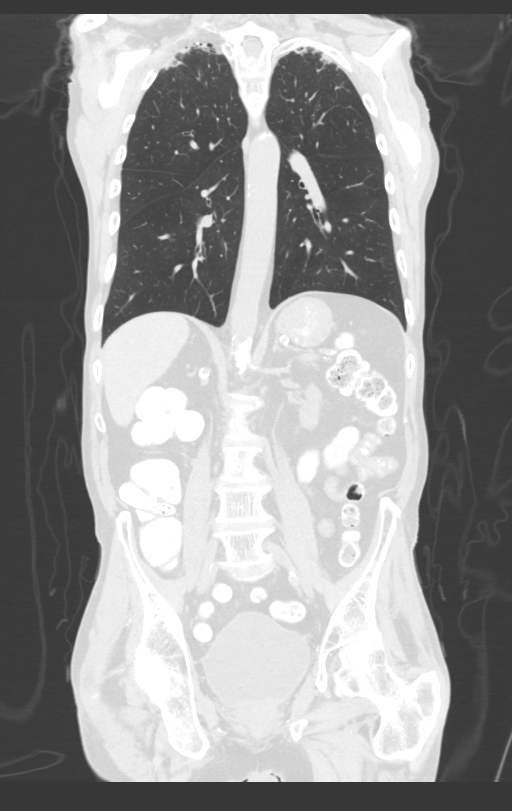

[13 of 36 positions shown; findings below may reference images not displayed]

FINDINGS: CT CHEST FINDINGS

Cardiovascular: Coronary artery calcification and aortic
atherosclerotic calcification.

Mediastinum/Nodes: No axillary or supraclavicular adenopathy. No
mediastinal or hilar adenopathy. No pericardial fluid. Esophagus
normal.

Subcarinal lymph node measuring 6 mm unchanged.

Lungs/Pleura: Biapical pleuroparenchymal thickening.

Musculoskeletal: No aggressive osseous lesion.

CT ABDOMEN AND PELVIS FINDINGS

Hepatobiliary: No focal hepatic lesion. No biliary ductal
dilatation. Gallbladder is normal. Common bile duct is normal.

Pancreas: Pancreas is normal. No ductal dilatation. No pancreatic
inflammation.

Spleen: Normal spleen

Adrenals/urinary tract: Adrenal glands normal. Atrophic RIGHT
kidney. Ureters and bladder normal.

Stomach/Bowel: Stomach, duodenum small-bowel normal. Cecum normal.
Appendix not identified. Ascending colon normal.

There is circumferential bowel wall thickening in the proximal
transverse colon over a 2.7 cm segment (image 32/series 4/coronal).
Lesion also seen on image 78/2 axial imaging. No evidence
obstruction. This region was hypermetabolic on comparison PET-CT
scan 11/27/2017.

No lesion identified in the sigmoid colon where there is a second
hypermetabolic lesion on comparison PET-CT scans.

Vascular/Lymphatic: Abdominal aorta is normal caliber. There is no
retroperitoneal or periportal lymphadenopathy. No pelvic
lymphadenopathy.

Reproductive: Prostate unremarkable

Other: No free fluid.

Musculoskeletal: No aggressive osseous lesion
IMPRESSION: 1. Near circumferential mass in the proximal transverse colon
consistent with primary colorectal carcinoma. No evidence of
obstruction currently.
2. Lesion in the sigmoid colon is not well appreciated by CT
imaging.
3. No evidence of metastatic disease.

## 2022-07-31 ENCOUNTER — Telehealth: Payer: Self-pay | Admitting: Nurse Practitioner

## 2022-07-31 NOTE — Telephone Encounter (Signed)
Left patient a message regarding upcoming appointment with Genetics Counselor, and call back number.

## 2022-08-22 DIAGNOSIS — Z85820 Personal history of malignant melanoma of skin: Secondary | ICD-10-CM | POA: Diagnosis not present

## 2022-08-22 DIAGNOSIS — L905 Scar conditions and fibrosis of skin: Secondary | ICD-10-CM | POA: Diagnosis not present

## 2022-08-22 DIAGNOSIS — L131 Subcorneal pustular dermatitis: Secondary | ICD-10-CM | POA: Diagnosis not present

## 2022-08-28 ENCOUNTER — Encounter: Payer: 59 | Admitting: Genetic Counselor

## 2022-08-28 ENCOUNTER — Other Ambulatory Visit: Payer: 59

## 2022-08-28 DIAGNOSIS — H401133 Primary open-angle glaucoma, bilateral, severe stage: Secondary | ICD-10-CM | POA: Diagnosis not present

## 2022-08-28 DIAGNOSIS — Z961 Presence of intraocular lens: Secondary | ICD-10-CM | POA: Diagnosis not present

## 2022-09-21 ENCOUNTER — Other Ambulatory Visit: Payer: Self-pay

## 2022-09-21 DIAGNOSIS — E785 Hyperlipidemia, unspecified: Secondary | ICD-10-CM

## 2022-09-21 MED ORDER — ATORVASTATIN CALCIUM 40 MG PO TABS: 40.0000 mg | ORAL_TABLET | Freq: Every morning | ORAL | 3 refills | Status: DC

## 2022-09-21 MED ORDER — APIXABAN 2.5 MG PO TABS: 2.5000 mg | ORAL_TABLET | Freq: Two times a day (BID) | ORAL | 3 refills | Status: DC

## 2022-09-25 NOTE — Telephone Encounter (Signed)
Called and lvm for patient to call back and schedule follow up appointment.   Thanks [employer]

## 2022-09-25 NOTE — Telephone Encounter (Signed)
Patient returns call to nurse line. Scheduled with PCP on 10/06/22.  Veronda Prude, RN

## 2022-10-06 ENCOUNTER — Ambulatory Visit (INDEPENDENT_AMBULATORY_CARE_PROVIDER_SITE_OTHER): Payer: 59 | Admitting: Family Medicine

## 2022-10-06 ENCOUNTER — Encounter: Payer: Self-pay | Admitting: Family Medicine

## 2022-10-06 ENCOUNTER — Other Ambulatory Visit: Payer: Self-pay

## 2022-10-06 VITALS — BP 122/48 | HR 59 | Ht 69.0 in | Wt 106.4 lb

## 2022-10-06 DIAGNOSIS — D631 Anemia in chronic kidney disease: Secondary | ICD-10-CM | POA: Diagnosis not present

## 2022-10-06 DIAGNOSIS — I951 Orthostatic hypotension: Secondary | ICD-10-CM

## 2022-10-06 DIAGNOSIS — C184 Malignant neoplasm of transverse colon: Secondary | ICD-10-CM | POA: Diagnosis not present

## 2022-10-06 DIAGNOSIS — Z23 Encounter for immunization: Secondary | ICD-10-CM

## 2022-10-06 DIAGNOSIS — N184 Chronic kidney disease, stage 4 (severe): Secondary | ICD-10-CM | POA: Diagnosis not present

## 2022-10-06 DIAGNOSIS — L603 Nail dystrophy: Secondary | ICD-10-CM | POA: Insufficient documentation

## 2022-10-06 DIAGNOSIS — C434 Malignant melanoma of scalp and neck: Secondary | ICD-10-CM

## 2022-10-06 DIAGNOSIS — I480 Paroxysmal atrial fibrillation: Secondary | ICD-10-CM | POA: Diagnosis not present

## 2022-10-06 NOTE — Assessment & Plan Note (Signed)
Last hemoglobin stable at 11 on 07/05/2022.  Continue iron supplementation.

## 2022-10-06 NOTE — Assessment & Plan Note (Addendum)
Slowly improving after smashing his thumb 7 months ago.  Persistent, hyperkeratotic lesion should warrant continued consideration of squamous cell carcinoma, especially given patient's clinical history.  Recommend monitoring.  Advised to let me know should area continue to not heal or start oozing blood/discharge.

## 2022-10-06 NOTE — Progress Notes (Signed)
    SUBJECTIVE:   CHIEF COMPLAINT / HPI:   Paroxysmal atrial fibrillation Has continued to take Eliquis without issue.  Fall Occurred while he was feeding his cat and stepped on a plastic plate.  It appears he fell on his bottom.  He hit his right arm on the side of the deck and had an abrasion.  No persistent pain.  No loss of consciousness.  He did not hit his head.  He did not feel dizzy.  Colon cancer, scalp melanoma Status post resection of both.  Is currently under surveillance only with oncology.  Iron deficiency anemia in CKD 4 Has been taking iron pills daily.  PERTINENT  PMH / PSH: CVA, SAH, GERD, orthostasis  OBJECTIVE:   BP (!) 122/48   Pulse (!) 59   Ht 5\' 9"  (1.753 m)   Wt 106 lb 6.4 oz (48.3 kg)   SpO2 100%   BMI 15.71 kg/m   General: Alert and oriented, in NAD Skin: Warm, well-healing, dry lesion of the right forearm without bleeding/drainage, diffusely dry skin on bilateral upper extremities, hyperkeratotic lesion of the right thumbnail without oozing/bleeding HEENT: NCAT, EOM grossly normal, midline nasal septum, oozing wound covered by bandages on top of scalp Cardiac: RRR, no m/r/g appreciated Respiratory: CTAB, breathing and speaking comfortably on RA Abdominal: Soft, nontender, nondistended, normoactive bowel sounds Extremities: Moves all extremities grossly equally Neurological: No gross focal deficit Psychiatric: Appropriate mood and affect     ASSESSMENT/PLAN:   Paroxysmal atrial fibrillation (HCC) Continue Eliquis.  Regular rate and rhythm on exam with me today.  Orthostatic hypotension Appears asymptomatic.  Do not believe fall was due to this (sounds like just a misstep).  However, advised patient to let me know should this happen again.  He is on midodrine with no complaints.  Blood pressure acceptable today without dizziness when ambulating.  Cancer of transverse colon [facility]) Continue following with oncology.  Malignant melanoma of skin  of scalp (HCC) Continue following with oncology and dermatology.  Dermatology office is helping with wound dressing.  States the wound is improving per patient.  Anemia in stage 4 chronic kidney disease (HCC) Last hemoglobin stable at 11 on 07/05/2022.  Continue iron supplementation.  Nail dystrophy Slowly improving after smashing his thumb 7 months ago.  Persistent, hyperkeratotic lesion should warrant continued consideration of squamous cell carcinoma, especially given patient's clinical history.  Recommend monitoring.  Advised to let me know should area continue to not heal or start oozing blood/discharge.   Health maintenance He received his flu and COVID shots today without incident.  Janeal Holmes, MD [facility]

## 2022-10-06 NOTE — Assessment & Plan Note (Signed)
Continue following with oncology.

## 2022-10-06 NOTE — Patient Instructions (Signed)
Keep taking your medicines. Follow with dermatology and oncology. You can show your dermatologist your thumb. Let me know if you have any more falls. If you need more resources for low mood, let me know. You will get vaccines today. You are doing great! It was wonderful to meet you.

## 2022-10-06 NOTE — Assessment & Plan Note (Signed)
Continue following with oncology and dermatology.  Dermatology office is helping with wound dressing.  States the wound is improving per patient.

## 2022-10-06 NOTE — Assessment & Plan Note (Signed)
Continue Eliquis.  Regular rate and rhythm on exam with me today.

## 2022-10-06 NOTE — Assessment & Plan Note (Addendum)
Appears asymptomatic.  Do not believe fall was due to this (sounds like just a misstep).  However, advised patient to let me know should this happen again.  He is on midodrine with no complaints.  Blood pressure acceptable today without dizziness when ambulating.

## 2022-10-16 ENCOUNTER — Other Ambulatory Visit: Payer: Self-pay

## 2022-10-16 DIAGNOSIS — E785 Hyperlipidemia, unspecified: Secondary | ICD-10-CM

## 2022-10-16 DIAGNOSIS — K449 Diaphragmatic hernia without obstruction or gangrene: Secondary | ICD-10-CM

## 2022-10-16 DIAGNOSIS — K219 Gastro-esophageal reflux disease without esophagitis: Secondary | ICD-10-CM

## 2022-10-16 MED ORDER — TIMOLOL MALEATE 0.5 % OP SOLN: 1.0000 [drp] | Freq: Two times a day (BID) | OPHTHALMIC | 3 refills | Status: DC

## 2022-10-16 MED ORDER — ACETAMINOPHEN EXTRA STRENGTH 500 MG PO TABS: 1.0000 | ORAL_TABLET | Freq: Four times a day (QID) | ORAL | 3 refills | Status: AC | PRN

## 2022-10-16 MED ORDER — FAMOTIDINE 20 MG PO TABS: 20.0000 mg | ORAL_TABLET | Freq: Two times a day (BID) | ORAL | 3 refills | Status: DC

## 2022-10-16 MED ORDER — VITAMIN D3 25 MCG (1000 UNIT) PO TABS: 1000.0000 [IU] | ORAL_TABLET | Freq: Every morning | ORAL | 3 refills | Status: AC

## 2022-10-18 DIAGNOSIS — R3914 Feeling of incomplete bladder emptying: Secondary | ICD-10-CM | POA: Diagnosis not present

## 2022-12-27 ENCOUNTER — Other Ambulatory Visit: Payer: Self-pay

## 2022-12-27 MED ORDER — MIDODRINE HCL 2.5 MG PO TABS: 2.5000 mg | ORAL_TABLET | Freq: Two times a day (BID) | ORAL | 1 refills | Status: DC

## 2023-01-19 DIAGNOSIS — H43813 Vitreous degeneration, bilateral: Secondary | ICD-10-CM | POA: Diagnosis not present

## 2023-01-19 DIAGNOSIS — H0102A Squamous blepharitis right eye, upper and lower eyelids: Secondary | ICD-10-CM | POA: Diagnosis not present

## 2023-01-19 DIAGNOSIS — H0102B Squamous blepharitis left eye, upper and lower eyelids: Secondary | ICD-10-CM | POA: Diagnosis not present

## 2023-01-19 DIAGNOSIS — H401133 Primary open-angle glaucoma, bilateral, severe stage: Secondary | ICD-10-CM | POA: Diagnosis not present

## 2023-01-19 DIAGNOSIS — Z961 Presence of intraocular lens: Secondary | ICD-10-CM | POA: Diagnosis not present

## 2023-01-21 NOTE — Progress Notes (Deleted)
 Patient Care Team: Tharon Lung, MD as PCP - General (Family Medicine) Kelsie Agent, MD (Inactive) as PCP - Electrophysiology (Cardiology) Nwobu, Eulas KIDD, MD as Consulting Physician [facility] 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 Charlanne showed a malignant appearing lesion in the transverse colon and multiple polyps, path from the transverse colon mass and the sigmoid polypectomy (complete removal) showed synchronous adenocarcinomas.  -Staging CT CAP were negative for distant metastasis.  -He eventually underwent robot assisted right colectomy 08/06/20; path showed invasive moderately differentiated adenocarcinoma spanning 4 cm invading through muscularis propria, resection margins were negative.  All 18 lymph nodes were negative, this was staged PT3 pN0, with preserved MMR; stage II -On surveillance  -He had complicated post op course, hospitalized 2 months after surgery and had a colonoscopy 09/2020 which was unremarkable. No planned surveillance colonoscopies  -Mr. Rispoli is clinically doing well.  Exam is benign, labs are stable.  His surveillance CT from 07/05/2022 was reviewed,  which shows no evidence of recurrence -Continue colon cancer surveillance, follow-up in 6 months, or sooner if needed    The  patient understands the plans discussed today and is in agreement with them.  He knows to contact our office if he develops concerns prior to his next appointment.  I provided *** minutes of face-to-face time during this encounter and > 50% was spent counseling as documented under my assessment and plan.    Powell FORBES Lessen, NP  [facility] - A DEPT OF [facility] [address] [place] [address] Dept: [phone] Dept Fax: [phone]   No orders of the defined types were placed in this encounter.     CHIEF COMPLAINT:  CC: Follow-up colon cancer and history of melanoma  Current Treatment: Surveillance  INTERVAL HISTORY:  Yahia is here today for repeat clinical assessment.  He was last seen by Lacie, NP on 07/24/2022.  He denies fevers or chills. He denies pain. His appetite is good. His weight {Weight change:10426}.  I have reviewed the past medical history, past surgical history, social history and family history with the patient and they are unchanged from previous note.  ALLERGIES:  is allergic to penicillins.  MEDICATIONS:  Current Outpatient Medications  Medication Sig Dispense Refill   Acetaminophen  Extra Strength 500 MG TABS Take 1 tablet (500 mg total) by mouth every 6 (six) hours as needed. 360 tablet 3   apixaban  (ELIQUIS ) 2.5 MG TABS tablet Take 1 tablet (2.5 mg total) by mouth 2 (two) times daily. (Patient taking differently: Take 2.5 mg by mouth daily.) 180 tablet 3   atorvastatin  (LIPITOR) 40 MG tablet Take 1 tablet (40 mg total) by mouth every morning. 90 tablet 3   cholecalciferol (VITAMIN D3) 25 MCG (1000 UNIT) tablet Take 1 tablet (1,000 Units total) by mouth every morning. 90 tablet 3   Dapsone 5 % topical  gel Apply 1 application. topically 2 (two) times a week.     famotidine  (PEPCID ) 20 MG tablet Take 1 tablet (20 mg total) by mouth 2 (two) times daily. 180 tablet 3   Ferrous Sulfate   (IRON  PO) Take 1 tablet by mouth daily.     finasteride  (PROSCAR ) 5 MG tablet Take 5 mg by mouth daily.     loperamide  (IMODIUM ) 2 MG capsule Take by mouth as needed for diarrhea or loose stools.     midodrine  (PROAMATINE ) 2.5 MG tablet Take 1 tablet (2.5 mg total) by mouth 2 (two) times daily with a meal. 180 tablet 1   timolol  (TIMOPTIC ) 0.5 % ophthalmic solution Place 1 drop into both eyes 2 (two) times daily. 45 mL 3   No current facility-administered medications for this visit.    HISTORY OF PRESENT ILLNESS:   Oncology History   No history exists.      REVIEW OF SYSTEMS:   Constitutional: Denies fevers, chills or abnormal weight loss Eyes: Denies blurriness of vision Ears, nose, mouth, throat, and face: Denies mucositis or sore throat Respiratory: Denies cough, dyspnea or wheezes Cardiovascular: Denies palpitation, chest discomfort or lower extremity swelling Gastrointestinal:  Denies nausea, heartburn or change in bowel habits Skin: Denies abnormal skin rashes Lymphatics: Denies new lymphadenopathy or easy bruising Neurological:Denies numbness, tingling or new weaknesses Behavioral/Psych: Mood is stable, no new changes  All other systems were reviewed with the patient and are negative.   VITALS:  There were no vitals taken for this visit.  Wt Readings from Last 3 Encounters:  10/06/22 106 lb 6.4 oz (48.3 kg)  07/24/22 108 lb (49 kg)  01/03/22 109 lb 4.8 oz (49.6 kg)    There is no height or weight on file to calculate BMI.  Performance status (ECOG): {CHL ONC [id number]  PHYSICAL EXAM:   GENERAL:alert, no distress and comfortable SKIN: skin color, texture, turgor are normal, no rashes or significant lesions EYES: normal, Conjunctiva are pink and non-injected, sclera clear OROPHARYNX:no exudate, no erythema and lips, buccal mucosa, and tongue normal  NECK: supple, thyroid normal size, non-tender, without nodularity LYMPH:  no palpable lymphadenopathy in the  cervical, axillary or inguinal LUNGS: clear to auscultation and percussion with normal breathing effort HEART: regular rate & rhythm and no murmurs and no lower extremity edema ABDOMEN:abdomen soft, non-tender and normal bowel sounds Musculoskeletal:no cyanosis of digits and no clubbing  NEURO: alert & oriented x 3 with fluent speech, no focal motor/sensory deficits  LABORATORY DATA:  I have reviewed the data as listed    Component Value Date/Time   NA 139 07/05/2022 0831   NA 137 02/03/2021 1601   K 4.8 07/05/2022 0831   CL 108 07/05/2022 0831   CO2 28 07/05/2022 0831   GLUCOSE 98 07/05/2022 0831   BUN 23 07/05/2022 0831   BUN 22 02/03/2021 1601   CREATININE 3.03 (H) 07/05/2022 0831   CALCIUM  9.6 07/05/2022 0831   CALCIUM  7.5 (L) 07/11/2010 0400   PROT 6.6 07/05/2022 0831   PROT 7.1 02/03/2021 1601   ALBUMIN  3.6 07/05/2022 0831   ALBUMIN  3.8 02/03/2021 1601   AST 12 (L) 07/05/2022 0831   ALT 5 07/05/2022 0831   ALKPHOS 68 07/05/2022 0831   BILITOT 0.5 07/05/2022 0831   GFRNONAA 20 (L) 07/05/2022 0831   GFRAA 28 (L) 09/26/2019 1313    No results found for: SPEP, UPEP  Lab Results  Component Value Date   WBC 4.9 07/05/2022   NEUTROABS 3.0 07/05/2022  HGB 11.0 (L) 07/05/2022   HCT 35.3 (L) 07/05/2022   MCV 93.9 07/05/2022   PLT 180 07/05/2022      Chemistry      Component Value Date/Time   NA 139 07/05/2022 0831   NA 137 02/03/2021 1601   K 4.8 07/05/2022 0831   CL 108 07/05/2022 0831   CO2 28 07/05/2022 0831   BUN 23 07/05/2022 0831   BUN 22 02/03/2021 1601   CREATININE 3.03 (H) 07/05/2022 0831      Component Value Date/Time   CALCIUM  9.6 07/05/2022 0831   CALCIUM  7.5 (L) 07/11/2010 0400   ALKPHOS 68 07/05/2022 0831   AST 12 (L) 07/05/2022 0831   ALT 5 07/05/2022 0831   BILITOT 0.5 07/05/2022 0831       RADIOGRAPHIC STUDIES: I have personally reviewed the radiological images as listed and agreed with the findings in the report. No results  found.

## 2023-01-21 NOTE — Assessment & Plan Note (Deleted)
 grade 2, pT3 N0 stage II, MMR normal  -Initially developed rectal bleeding with symptomatic IDA/acute on chronic anemia in 2021, work up colonoscopy 04/2019 by Dr. Charlanne showed a malignant appearing lesion in the transverse colon and multiple polyps, path from the transverse colon mass and the sigmoid polypectomy (complete removal) showed synchronous adenocarcinomas.  -Staging CT CAP were negative for distant metastasis.  -He eventually underwent robot assisted right colectomy 08/06/20; path showed invasive moderately differentiated adenocarcinoma spanning 4 cm invading through muscularis propria, resection margins were negative.  All 18 lymph nodes were negative, this was staged PT3 pN0, with preserved MMR; stage II -On surveillance  -He had complicated post op course, hospitalized 2 months after surgery and had a colonoscopy 09/2020 which was unremarkable. No planned surveillance colonoscopies  -Keith [name] is clinically doing well.  Exam is benign, labs are stable.  His surveillance CT from 07/05/2022 was reviewed,  which shows no evidence of recurrence -Continue colon cancer surveillance, follow-up in 6 months, or sooner if needed

## 2023-01-22 ENCOUNTER — Telehealth: Payer: Self-pay | Admitting: Nurse Practitioner

## 2023-01-22 ENCOUNTER — Inpatient Hospital Stay: Payer: 59 | Admitting: Nurse Practitioner

## 2023-01-22 ENCOUNTER — Inpatient Hospital Stay: Payer: 59

## 2023-01-22 DIAGNOSIS — C184 Malignant neoplasm of transverse colon: Secondary | ICD-10-CM

## 2023-01-22 NOTE — Telephone Encounter (Signed)
 Patient called to reschedule appt for 01/07, appt reschedule for 01/17... Date and time confirmed.

## 2023-01-23 ENCOUNTER — Ambulatory Visit: Payer: 59 | Admitting: Nurse Practitioner

## 2023-01-23 ENCOUNTER — Other Ambulatory Visit: Payer: 59

## 2023-02-01 ENCOUNTER — Other Ambulatory Visit: Payer: 59

## 2023-02-01 ENCOUNTER — Ambulatory Visit: Payer: 59 | Admitting: Nurse Practitioner

## 2023-02-22 ENCOUNTER — Other Ambulatory Visit: Payer: Self-pay | Admitting: Nurse Practitioner

## 2023-02-22 DIAGNOSIS — C189 Malignant neoplasm of colon, unspecified: Secondary | ICD-10-CM

## 2023-02-22 NOTE — Assessment & Plan Note (Signed)
 stage IIC -Diagnosed 2019, s/p excision, SLNB, and selective left neck dissection 04/2017 -On surveillance, continues with derm -routine surveillance scan after 5 years is not recommended per NCCN, will repeat clinically as needed

## 2023-02-22 NOTE — Progress Notes (Signed)
 I connected with Keith [name] on 02/25/23 at 10:30 AM EST by video visit over teams platform, and verified that I am speaking with the correct person using two identifiers.   I discussed the limitations, risks, security and privacy concerns of performing an evaluation and management service by telemedicine and the availability of in-person appointments. I also discussed with the patient that there may be a patient responsible charge related to this service. The patient expressed understanding and agreed to proceed.   Other persons participating in the visit and their role in the encounter: n/a   Patient's location: [facility] a [facility] cancer center Provider's location: Home  Chief Complaint: Follow-up colon cancer    Patient Care Team: Tharon Lung, MD as PCP - General (Family Medicine) Kelsie Agent, MD (Inactive) as PCP - Electrophysiology (Cardiology) Nwobu, Eulas KIDD, MD as Consulting Physician [facility] 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 Charlanne showed a malignant appearing lesion in the transverse colon and multiple polyps, path from the transverse colon mass and the sigmoid polypectomy (complete removal) showed synchronous adenocarcinomas.  -Staging CT CAP were negative for distant metastasis.  -He  eventually underwent robot assisted right colectomy 08/06/20; path showed invasive moderately differentiated adenocarcinoma spanning 4 cm invading through muscularis propria, resection margins were negative.  All 18 lymph nodes were negative, this was staged PT3 pN0, with preserved MMR; stage II -On surveillance  -He had complicated post op course, hospitalized 2 months after surgery and had a colonoscopy 09/2020 which was unremarkable. No planned surveillance colonoscopies  -Keith [name] is clinically doing well.  Exam is benign, labs are stable.  Recent surveillance CT from 07/05/2022 which shows no evidence of recurrence -Continue colon cancer surveillance, follow-up in 6 months, or sooner if needed  Malignant melanoma of skin of scalp (HCC) stage IIC -Diagnosed 2019, s/p excision, SLNB, and selective left neck dissection 04/2017 -On surveillance, continues with derm -routine surveillance scan after 5 years is not recommended per NCCN, will repeat clinically as needed   Plan: Labs reviewed  -CBC showing WBC 5.2; Hgb 11.9; Hct 38.4; Plt 228; Anc 3.4 -CMP - K 4.1; glucose 69; BUN 21; Creatinine 2.41; eGFR 26; Ca 8.8; LFTs normal.   Persistent and mild anemia. Clinically, patient is stable and doing well. Sees primary care on routine basis with monitoring of labs, including kidney functions.  Does have history of CKD. Plan to follow-up in 6 months with labs and sooner if needed.     The patient understands the plans discussed today and is in agreement with them.  He knows to contact our office if he develops concerns prior to his next appointment.  I provided 15 minutes of face-to-face time during this encounter and > 50% was spent counseling as documented under my assessment and plan.    Powell FORBES Lessen, NP  [facility] - A DEPT OF [facility] [address] [place] [address] Dept: [phone] Dept Fax: [phone]    No orders of the defined types were placed in this encounter.     CHIEF COMPLAINT:  CC: Cancer of colon  Current Treatment: Surveillance  INTERVAL HISTORY:  Keith [name] is here today for repeat clinical assessment.  He was last seen by Lacie, NP on 07/24/2022.  The patient reports feeling well in general.  He does have diarrhea that does come and go.  When he has it, Imodium  works well to control.  He denies presence of blood in his stool.  Denies nausea and vomiting.  States he is able to take care of himself completely.  Cooks for himself, washes his own clothes, cleans his home, and stays active physically. He denies chest pain, chest pressure, or shortness of breath. He denies headaches or visual disturbances. He denies abdominal pain, nausea, vomiting, or changes in bowel or bladder habits.   He denies fevers or chills. He denies pain. His appetite is good. His weight has decreased 4 pounds over last 6 months .  I have reviewed the past medical history, past surgical history, social history and family history with the patient and they are unchanged from previous note.  ALLERGIES:  is allergic to penicillins.  MEDICATIONS:  Current Outpatient Medications  Medication Sig Dispense Refill   Acetaminophen  Extra Strength 500 MG TABS Take 1 tablet (500 mg total) by mouth every 6 (six) hours as needed. 360 tablet 3   apixaban  (ELIQUIS ) 2.5 MG TABS tablet Take 1 tablet (2.5 mg total) by mouth 2 (two) times daily. (Patient taking differently: Take 2.5 mg by mouth daily.) 180 tablet 3   atorvastatin  (LIPITOR) 40 MG tablet Take 1 tablet (40 mg total) by mouth every morning. 90 tablet 3   cholecalciferol (VITAMIN D3) 25 MCG (1000 UNIT) tablet Take 1 tablet (1,000 Units total) by mouth every morning. 90 tablet 3   Dapsone 5 % topical gel Apply 1 application. topically 2 (two) times a week.     famotidine  (PEPCID ) 20 MG tablet Take 1 tablet (20 mg total) by mouth 2 (two) times daily. 180 tablet 3    Ferrous Sulfate  (IRON  PO) Take 1 tablet by mouth daily.     finasteride  (PROSCAR ) 5 MG tablet Take 5 mg by mouth daily.     loperamide  (IMODIUM ) 2 MG capsule Take by mouth as needed for diarrhea or loose stools.     midodrine  (PROAMATINE ) 2.5 MG tablet Take 1 tablet (2.5 mg total) by mouth 2 (two) times daily with a meal. 180 tablet 1   timolol  (TIMOPTIC ) 0.5 % ophthalmic solution Place 1 drop into both eyes 2 (two) times daily. 45 mL 3   No current facility-administered medications for this visit.       REVIEW OF SYSTEMS:   Constitutional: Denies fevers, chills or abnormal weight loss Eyes: Denies blurriness of vision Ears, nose, mouth, throat, and face: Denies mucositis or sore throat Respiratory: Denies cough, dyspnea or wheezes Cardiovascular: Denies palpitation, chest discomfort or lower extremity swelling Gastrointestinal:  Denies nausea, heartburn or change in bowel habits.  States she has had off-and-on diarrhea since surgery for colon cancer.  He is able to control with Imodium .. Skin: Denies abnormal skin rashes Lymphatics: Denies new lymphadenopathy or easy bruising Neurological:Denies numbness, tingling or new weaknesses Behavioral/Psych: Mood is stable, no new changes  All other systems were reviewed with the patient and are negative.   VITALS:   Today's  Vitals   02/23/23 1005 02/23/23 1016  BP: (!) 112/55   Pulse: 60   Resp: 15   Temp: 97.7 F (36.5 C)   TempSrc: Temporal   SpO2: 100%   Weight: 104 lb 3.2 oz (47.3 kg)   PainSc:  0-No pain   Body mass index is 15.39 kg/m.   Wt Readings from Last 3 Encounters:  02/23/23 104 lb 3.2 oz (47.3 kg)  10/06/22 106 lb 6.4 oz (48.3 kg)  07/24/22 108 lb (49 kg)    Body mass index is 15.39 kg/m.  Performance status (ECOG): 1 - Symptomatic but completely ambulatory  PHYSICAL EXAM:   GENERAL:alert, no distress and comfortable SKIN: skin color, texture, turgor are normal, no rashes or significant lesions EYES:  normal, Conjunctiva are pink and non-injected, sclera clear OROPHARYNX:no exudate, no erythema and lips, buccal mucosa, and tongue normal  NECK: supple, thyroid normal size, non-tender, without nodularity LYMPH:  no palpable lymphadenopathy in the cervical, axillary or inguinal LUNGS: clear to auscultation and percussion with normal breathing effort HEART: regular rate & rhythm and no murmurs and no lower extremity edema ABDOMEN:abdomen soft, non-tender and normal bowel sounds Musculoskeletal:no cyanosis of digits and no clubbing  NEURO: alert & oriented x 3 with fluent speech, no focal motor/sensory deficits  LABORATORY DATA:  I have reviewed the data as listed    Component Value Date/Time   NA 139 02/23/2023 0951   NA 137 02/03/2021 1601   K 4.1 02/23/2023 0951   CL 106 02/23/2023 0951   CO2 28 02/23/2023 0951   GLUCOSE 69 (L) 02/23/2023 0951   BUN 21 02/23/2023 0951   BUN 22 02/03/2021 1601   CREATININE 2.41 (H) 02/23/2023 0951   CALCIUM  8.8 (L) 02/23/2023 0951   CALCIUM  7.5 (L) 07/11/2010 0400   PROT 6.9 02/23/2023 0951   PROT 7.1 02/03/2021 1601   ALBUMIN  3.6 02/23/2023 0951   ALBUMIN  3.8 02/03/2021 1601   AST 10 (L) 02/23/2023 0951   ALT <5 02/23/2023 0951   ALKPHOS 74 02/23/2023 0951   BILITOT 0.4 02/23/2023 0951   GFRNONAA 26 (L) 02/23/2023 0951   GFRAA 28 (L) 09/26/2019 1313     Lab Results  Component Value Date   WBC 5.2 02/23/2023   NEUTROABS 3.4 02/23/2023   HGB 11.9 (L) 02/23/2023   HCT 38.4 (L) 02/23/2023   MCV 95.3 02/23/2023   PLT 228 02/23/2023

## 2023-02-22 NOTE — Assessment & Plan Note (Signed)
 grade 2, pT3 N0 stage II, MMR normal  -Initially developed rectal bleeding with symptomatic IDA/acute on chronic anemia in 2021, work up colonoscopy 04/2019 by Dr. Charlanne showed a malignant appearing lesion in the transverse colon and multiple polyps, path from the transverse colon mass and the sigmoid polypectomy (complete removal) showed synchronous adenocarcinomas.  -Staging CT CAP were negative for distant metastasis.  -He eventually underwent robot assisted right colectomy 08/06/20; path showed invasive moderately differentiated adenocarcinoma spanning 4 cm invading through muscularis propria, resection margins were negative.  All 18 lymph nodes were negative, this was staged PT3 pN0, with preserved MMR; stage II -On surveillance  -He had complicated post op course, hospitalized 2 months after surgery and had a colonoscopy 09/2020 which was unremarkable. No planned surveillance colonoscopies  -Keith [name] is clinically doing well.  Exam is benign, labs are stable.  Recent surveillance CT from 07/05/2022 which shows no evidence of recurrence -Continue colon cancer surveillance, follow-up in 6 months, or sooner if needed

## 2023-02-23 ENCOUNTER — Inpatient Hospital Stay (HOSPITAL_BASED_OUTPATIENT_CLINIC_OR_DEPARTMENT_OTHER): Payer: 59 | Admitting: Nurse Practitioner

## 2023-02-23 ENCOUNTER — Inpatient Hospital Stay: Payer: 59 | Attending: Nurse Practitioner

## 2023-02-23 VITALS — BP 112/55 | HR 60 | Temp 97.7°F | Resp 15 | Wt 104.2 lb

## 2023-02-23 DIAGNOSIS — C434 Malignant melanoma of scalp and neck: Secondary | ICD-10-CM | POA: Insufficient documentation

## 2023-02-23 DIAGNOSIS — C184 Malignant neoplasm of transverse colon: Secondary | ICD-10-CM | POA: Insufficient documentation

## 2023-02-23 DIAGNOSIS — C189 Malignant neoplasm of colon, unspecified: Secondary | ICD-10-CM

## 2023-02-23 LAB — CBC WITH DIFFERENTIAL (CANCER CENTER ONLY)
Abs Immature Granulocytes: 0.01 10*3/uL (ref 0.00–0.07)
Basophils Absolute: 0 10*3/uL (ref 0.0–0.1)
Basophils Relative: 1 %
Eosinophils Absolute: 0.1 10*3/uL (ref 0.0–0.5)
Eosinophils Relative: 2 %
HCT: 38.4 % — ABNORMAL LOW (ref 39.0–52.0)
Hemoglobin: 11.9 g/dL — ABNORMAL LOW (ref 13.0–17.0)
Immature Granulocytes: 0 %
Lymphocytes Relative: 21 %
Lymphs Abs: 1.1 10*3/uL (ref 0.7–4.0)
MCH: 29.5 pg (ref 26.0–34.0)
MCHC: 31 g/dL (ref 30.0–36.0)
MCV: 95.3 fL (ref 80.0–100.0)
Monocytes Absolute: 0.6 10*3/uL (ref 0.1–1.0)
Monocytes Relative: 11 %
Neutro Abs: 3.4 10*3/uL (ref 1.7–7.7)
Neutrophils Relative %: 65 %
Platelet Count: 228 10*3/uL (ref 150–400)
RBC: 4.03 MIL/uL — ABNORMAL LOW (ref 4.22–5.81)
RDW: 13.7 % (ref 11.5–15.5)
WBC Count: 5.2 10*3/uL (ref 4.0–10.5)
nRBC: 0 % (ref 0.0–0.2)

## 2023-02-23 LAB — CMP (CANCER CENTER ONLY)
ALT: <5 U/L (ref 0–44)
AST: 10 U/L — ABNORMAL LOW (ref 15–41)
Albumin: 3.6 g/dL (ref 3.5–5.0)
Alkaline Phosphatase: 74 U/L (ref 38–126)
Anion gap: 5 (ref 5–15)
BUN: 21 mg/dL (ref 8–23)
CO2: 28 mmol/L (ref 22–32)
Calcium: 8.8 mg/dL — ABNORMAL LOW (ref 8.9–10.3)
Chloride: 106 mmol/L (ref 98–111)
Creatinine: 2.41 mg/dL — ABNORMAL HIGH (ref 0.61–1.24)
GFR, Estimated: 26 mL/min — ABNORMAL LOW (ref >60)
Glucose, Bld: 69 mg/dL — ABNORMAL LOW (ref 70–99)
Potassium: 4.1 mmol/L (ref 3.5–5.1)
Sodium: 139 mmol/L (ref 135–145)
Total Bilirubin: 0.4 mg/dL (ref 0.0–1.2)
Total Protein: 6.9 g/dL (ref 6.5–8.1)

## 2023-02-25 ENCOUNTER — Encounter: Payer: Self-pay | Admitting: Hematology

## 2023-02-25 ENCOUNTER — Encounter: Payer: Self-pay | Admitting: Nurse Practitioner

## 2023-02-26 ENCOUNTER — Telehealth: Payer: Self-pay | Admitting: Nurse Practitioner

## 2023-02-26 DIAGNOSIS — L309 Dermatitis, unspecified: Secondary | ICD-10-CM | POA: Diagnosis not present

## 2023-02-26 DIAGNOSIS — D692 Other nonthrombocytopenic purpura: Secondary | ICD-10-CM | POA: Diagnosis not present

## 2023-02-26 DIAGNOSIS — L131 Subcorneal pustular dermatitis: Secondary | ICD-10-CM | POA: Diagnosis not present

## 2023-02-26 DIAGNOSIS — L738 Other specified follicular disorders: Secondary | ICD-10-CM | POA: Diagnosis not present

## 2023-02-26 DIAGNOSIS — Z85820 Personal history of malignant melanoma of skin: Secondary | ICD-10-CM | POA: Diagnosis not present

## 2023-02-26 DIAGNOSIS — D225 Melanocytic nevi of trunk: Secondary | ICD-10-CM | POA: Diagnosis not present

## 2023-02-26 DIAGNOSIS — Z85828 Personal history of other malignant neoplasm of skin: Secondary | ICD-10-CM | POA: Diagnosis not present

## 2023-02-26 DIAGNOSIS — L0889 Other specified local infections of the skin and subcutaneous tissue: Secondary | ICD-10-CM | POA: Diagnosis not present

## 2023-02-26 DIAGNOSIS — L905 Scar conditions and fibrosis of skin: Secondary | ICD-10-CM | POA: Diagnosis not present

## 2023-02-26 NOTE — Telephone Encounter (Signed)
 Scheduled appointments per 2/7 los. Left VM with appointment details.

## 2023-05-17 ENCOUNTER — Ambulatory Visit: Payer: 59

## 2023-05-17 VITALS — BP 115/72 | HR 61 | Wt 114.0 lb

## 2023-05-17 DIAGNOSIS — Z0000 Encounter for general adult medical examination without abnormal findings: Secondary | ICD-10-CM

## 2023-05-17 NOTE — Progress Notes (Signed)
 Because this visit was a virtual/telehealth visit,  certain criteria was not obtained, such a blood pressure, CBG if applicable, and timed get up and go. Any medications not marked as "taking" were not mentioned during the medication reconciliation part of the visit. Any vitals not documented were not able to be obtained due to this being a telehealth visit or patient was unable to self-report a recent blood pressure reading due to a lack of equipment at home via telehealth. Vitals that have been documented are verbally provided by the patient.   Subjective:   Keith [name] is a 83 y.o. who presents for a Medicare Wellness preventive visit.  Visit Complete: Virtual I connected with  Keith [name] on 05/17/23 by a audio enabled telemedicine application and verified that I am speaking with the correct person using two identifiers.  Patient Location: Home  Provider Location: Office/Clinic  I discussed the limitations of evaluation and management by telemedicine. The patient expressed understanding and agreed to proceed.  Vital Signs: Because this visit was a virtual/telehealth visit, some criteria may be missing or patient reported. Any vitals not documented were not able to be obtained and vitals that have been documented are patient reported.  VideoDeclined- This patient declined Librarian, academic. Therefore the visit was completed with audio only.  Persons Participating in Visit: Patient.  AWV Questionnaire: Yes: Patient Medicare AWV questionnaire was completed by the patient on 05/15/2023; I have confirmed that all information answered by patient is correct and no changes since this date.  Cardiac Risk Factors include: advanced age (>23men, >41 women)     Objective:    Today's Vitals   05/17/23 0912  BP: 115/72  Pulse: 61  SpO2: 97%  Weight: 114 lb (51.7 kg)  PainSc: 0-No pain   Body mass index is 16.83 kg/m.     05/17/2023    9:20 AM  04/11/2022   12:00 PM 12/22/2021   10:10 AM 09/07/2021   11:16 AM 06/24/2021    9:21 AM 06/16/2021    4:55 AM 06/15/2021    8:57 AM  Advanced Directives  Does Patient Have a Medical Advance Directive? Yes No No No Yes  No  Type of Estate agent of [place];Living will    Healthcare Power of Attorney    Does patient want to make changes to medical advance directive? No - Patient declined        Copy of Healthcare Power of Attorney in Chart? Yes - validated most recent copy scanned in chart (See row information)    Yes - validated most recent copy scanned in chart (See row information)    Would patient like information on creating a medical advance directive?  No - Patient declined No - Patient declined No - Patient declined  No - Patient declined     Current Medications (verified) Outpatient Encounter Medications as of 05/17/2023  Medication Sig   Acetaminophen  Extra Strength 500 MG TABS Take 1 tablet (500 mg total) by mouth every 6 (six) hours as needed.   apixaban  (ELIQUIS ) 2.5 MG TABS tablet Take 1 tablet (2.5 mg total) by mouth 2 (two) times daily. (Patient taking differently: Take 2.5 mg by mouth daily.)   atorvastatin  (LIPITOR) 40 MG tablet Take 1 tablet (40 mg total) by mouth every morning.   cholecalciferol (VITAMIN D3) 25 MCG (1000 UNIT) tablet Take 1 tablet (1,000 Units total) by mouth every morning.   Dapsone 5 % topical gel Apply 1 application. topically  2 (two) times a week.   famotidine  (PEPCID ) 20 MG tablet Take 1 tablet (20 mg total) by mouth 2 (two) times daily.   Ferrous Sulfate  (IRON  PO) Take 1 tablet by mouth daily.   finasteride  (PROSCAR ) 5 MG tablet Take 5 mg by mouth daily.   loperamide  (IMODIUM ) 2 MG capsule Take by mouth as needed for diarrhea or loose stools.   midodrine  (PROAMATINE ) 2.5 MG tablet Take 1 tablet (2.5 mg total) by mouth 2 (two) times daily with a meal.   timolol  (TIMOPTIC ) 0.5 % ophthalmic solution Place 1 drop into both eyes 2 (two)  times daily.   No facility-administered encounter medications on file as of 05/17/2023.    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   Melanoma (HCC)    scalp   Melanoma of scalp (HCC) 04/23/2017   Obstructive uropathy    PONV (postoperative nausea and vomiting) 03/20/2017   Stroke (HCC) 11/2016   speech and right side at time of stroke 04/20/17- all function return to normal   Symptomatic anemia 05/08/2019   Underweight 01/24/2018   Urinary tract infection without hematuria    Past Surgical History:  Procedure Laterality Date   APPLICATION OF A-CELL OF HEAD/NECK N/A 04/23/2017   Procedure: APPLICATION OF INTREGRA;  Surgeon: Alger Infield, MD;  Location: [facility] OR;  Service: Plastics;  Laterality: N/A;   BIOPSY  05/10/2019   Procedure: BIOPSY;  Surgeon: Lajuan Pila, MD;  Location: [facility] ENDOSCOPY;  Service: Endoscopy;;   BIOPSY  09/23/2020   Procedure: BIOPSY;  Surgeon: Lajuan Pila, MD;  Location: [facility] ENDOSCOPY;  Service: Endoscopy;;   BIOPSY  09/24/2020   Procedure: BIOPSY;  Surgeon: Lajuan Pila, MD;  Location: [facility] ENDOSCOPY;  Service: Endoscopy;;   CATARACT EXTRACTION     COLONOSCOPY WITH PROPOFOL  N/A 05/10/2019   Procedure: COLONOSCOPY WITH PROPOFOL ;  Surgeon: Lajuan Pila, MD;  Location: [facility] ENDOSCOPY;  Service: Endoscopy;  Laterality: N/A;   COLONOSCOPY WITH PROPOFOL  N/A 09/24/2020   Procedure: COLONOSCOPY WITH PROPOFOL ;  Surgeon: Lajuan Pila, MD;  Location:  [facility] ENDOSCOPY;  Service: Endoscopy;  Laterality: N/A;   ESOPHAGOGASTRODUODENOSCOPY (EGD) WITH PROPOFOL  N/A 05/10/2019   Procedure: ESOPHAGOGASTRODUODENOSCOPY (EGD) WITH PROPOFOL ;  Surgeon: Lajuan Pila, MD;  Location: [facility] ENDOSCOPY;  Service: Endoscopy;  Laterality: N/A;   ESOPHAGOGASTRODUODENOSCOPY (EGD) WITH PROPOFOL  N/A 09/23/2020   Procedure: ESOPHAGOGASTRODUODENOSCOPY (EGD) WITH PROPOFOL ;  Surgeon: Lajuan Pila, MD;  Location: [facility] ENDOSCOPY;  Service: Endoscopy;  Laterality: N/A;   EXCISION MASS HEAD N/A 03/20/2017   Procedure: EXCISION scalp lesion;  Surgeon: Alger Infield, MD;  Location: [facility] OR;  Service: Plastics;  Laterality: N/A;  EXCISION scalp lesion   EYE SURGERY     IR ANGIO INTRA EXTRACRAN SEL COM CAROTID INNOMINATE BILAT MOD SED  11/23/2016   IR ANGIO VERTEBRAL SEL VERTEBRAL UNI L MOD SED  11/23/2016   LOOP RECORDER INSERTION N/A 11/23/2016   Procedure: LOOP RECORDER INSERTION;  Surgeon: Jolly Needle, MD;  Location: [facility] INVASIVE CV LAB;  Service: Cardiovascular;  Laterality: N/A;   MASS EXCISION N/A 04/23/2017   Procedure: EXCISION MALIGNANT LESION OF SCALP;  Surgeon: Alger Infield, MD;  Location: [facility] OR;  Service: Plastics;  Laterality: N/A;   MELANOMA EXCISION  04/23/2017   BILATERAL SUPERFICIAL PAROTIDECTOMY/NECK SENTINEL NODE BIOPSY WITH FROZEN SECTION; LEFT SELECTIVE NECK DISSECTION   PAROTIDECTOMY Bilateral 04/23/2017   Procedure: BILATERAL SUPERFICIAL PAROTIDECTOMY/NECK SENTINEL NODE BIOPSY WITH FROZEN SECTION; LEFT SELECTIVE NECK DISSECTION;  Surgeon: Janita Mellow, MD;  Location: [facility] OR;  Service: ENT;  Laterality: Bilateral;   POLYPECTOMY  05/10/2019   Procedure: POLYPECTOMY;  Surgeon: Lajuan Pila, MD;  Location: [facility] ENDOSCOPY;  Service: Endoscopy;;   POLYPECTOMY  09/24/2020   Procedure: POLYPECTOMY;  Surgeon: Lajuan Pila, MD;  Location: [facility] ENDOSCOPY;  Service: Endoscopy;;   SKIN GRAFT SPLIT THICKNESS HEAD / NECK  06/04/2017   SKIN GRAFT SPLIT THICKNESS TO SCALP FROM RIGHT OR  LEFT THIGH POSSIBLE A CELL TO DONOR SITE   SKIN SPLIT GRAFT N/A 06/04/2017   Procedure: SKIN GRAFT SPLIT THICKNESS TO SCALP FROM RIGHT OR LEFT THIGH POSSIBLE A CELL TO DONOR SITE;  Surgeon: Alger Infield, MD;  Location: [facility] OR;  Service: Plastics;  Laterality: N/A;   SUBMUCOSAL TATTOO INJECTION  05/10/2019   Procedure: SUBMUCOSAL TATTOO INJECTION;  Surgeon: Lajuan Pila, MD;  Location: [facility] ENDOSCOPY;  Service: Endoscopy;;   THYROIDECTOMY     TONSILLECTOMY     Family History  Problem Relation Age of Onset   Lung cancer Father    Lung cancer Mother    Diabetes Brother    Colon cancer Neg Hx    Colon polyps Neg Hx    Esophageal cancer Neg Hx    Stomach cancer Neg Hx    Pancreatic cancer Neg Hx    Social History   Socioeconomic History   Marital status: Widowed    Spouse name: Not on file   Number of children: 1   Years of education: 3   Highest education level: 12th grade  Occupational History   Occupation: laborer    Comment: retired  Tobacco Use   Smoking status: Former    Current packs/day: 0.00    Average packs/day: 2.0 packs/day for 32.0 years (64.0 ttl pk-yrs)    Types: Cigarettes    Start date: 59    Quit date: 1983    Years since quitting: 42.3   Smokeless tobacco: Never   Tobacco comments:    No plans to start  Vaping Use   Vaping status: Never Used  Substance and Sexual Activity   Alcohol use: No   Drug use: No   Sexual activity: Not Currently  Other Topics Concern   Not on file  Social History Narrative   Patient lives alone in [place].    Patient has one son and a daughter in law, Laveta Pottier. Patient is close with them.    Patient has a dog named "boo boo bear." Great joy for him.   Patient enjoys spending time with his family and walking his dog.          Social Drivers of Health   Financial Resource Strain: Medium Risk (05/17/2023)   Overall Financial Resource Strain (CARDIA)    Difficulty of Paying Living Expenses: Somewhat hard  Food  Insecurity: Food Insecurity Present (05/17/2023)   Hunger Vital Sign    Worried About Running Out of Food in the Last Year: Sometimes true    Ran Out  of Food in the Last Year: Sometimes true  Transportation Needs: No Transportation Needs (05/17/2023)   PRAPARE - Administrator, Civil Service (Medical): No    Lack of Transportation (Non-Medical): No  Physical Activity: Insufficiently Active (05/17/2023)   Exercise Vital Sign    Days of Exercise per Week: 1 day    Minutes of Exercise per Session: 30 min  Stress: No Stress Concern Present (05/17/2023)   [employer] of Occupational Health - Occupational Stress Questionnaire    Feeling of Stress : Not at all  Social Connections: Socially Isolated (05/17/2023)   Social Connection and Isolation Panel [NHANES]    Frequency of Communication with Friends and Family: More than three times a week    Frequency of Social Gatherings with Friends and Family: More than three times a week    Attends Religious Services: Never    Database administrator or Organizations: No    Attends Banker Meetings: Not on file    Marital Status: Widowed    Tobacco Counseling Counseling given: Not Answered Tobacco comments: No plans to start    Clinical Intake:  Pre-visit preparation completed: Yes  Pain : No/denies pain Pain Score: 0-No pain     BMI - recorded: 16.83 Nutritional Status: BMI <19  Underweight Nutritional Risks: None Diabetes: No  Lab Results  Component Value Date   HGBA1C 5.5 11/20/2016     How often do you need to have someone help you when you read instructions, pamphlets, or other written materials from your doctor or pharmacy?: 1 - Never What is the last grade level you completed in school?: HSG  Interpreter Needed?: No  Information entered by :: Korie Brabson N. Rieley Hausman, LPN.   Activities of Daily Living     05/17/2023    9:20 AM 05/15/2023    7:06 PM  In your present state of health, do you have any  difficulty performing the following activities:  Hearing? 1 1  Vision? 0 0  Difficulty concentrating or making decisions? 0 0  Walking or climbing stairs? 0 0  Dressing or bathing? 0 0  Doing errands, shopping? 1 1  Preparing Food and eating ? N N  Using the Toilet? N N  In the past six months, have you accidently leaked urine? N N  Do you have problems with loss of bowel control? N N  Managing your Medications? N N  Managing your Finances? N N  Housekeeping or managing your Housekeeping? N N    Patient Care Team: Dema Filler, MD as PCP - General (Family Medicine) Jolly Needle, MD (Inactive) as PCP - Electrophysiology (Cardiology) Nwobu, Tommy Frames, MD as Consulting Physician [facility] Alger Infield, MD as Consulting Physician (Plastic Surgery) Chrystal Crape (Dermatology) Drusilla Gerlach, MD as Consulting Physician (Dermatology) Asa Lauth, NP (Inactive) as Nurse Practitioner (Nurse Practitioner) Gaylyn Keas Arvis Bison, NP as Nurse Practitioner (Neurology) Johny Nap, NP as Nurse Practitioner (Neurology) Sonja Estelle, MD as Consulting Physician (Hematology and Oncology)  Indicate any recent Medical Services you may have received from other than [facility] providers in the past year (date may be approximate).     Assessment:   This is a routine wellness examination for Keith [name].  Hearing/Vision screen Hearing Screening - Comments:: Patient has hearing difficulties in left ear. No hearing aids.  Vision Screening - Comments:: Wears rx glasses - up to date with routine eye exams with Devin Foerster, MD. Cataract surgery completed.    Goals Addressed  This Visit's Progress    05/17/23: To Stay Healthy         Depression Screen     05/17/2023    9:17 AM 10/06/2022   10:10 AM 04/11/2022   11:42 AM 12/22/2021   10:07 AM 09/07/2021   11:17 AM 06/24/2021    9:21 AM 05/23/2021    9:35 AM  PHQ 2/9 Scores  PHQ - 2 Score 0 0 0 0 0 1 0   PHQ- 9 Score 0 0  0 4 2 1     Fall Risk     05/17/2023    9:17 AM 05/15/2023    7:06 PM 10/06/2022   10:15 AM 10/06/2022   10:10 AM 04/11/2022   11:43 AM  Fall Risk   Falls in the past year? 0 0 1 0 0  Number falls in past yr: 0 0 0 0 0  Injury with Fall? 0 0 1  0  Risk for fall due to : No Fall Risks    No Fall Risks  Follow up Falls prevention discussed;Falls evaluation completed    Falls evaluation completed    MEDICARE RISK AT HOME:  Medicare Risk at Home Any stairs in or around the home?: Yes If so, are there any without handrails?: No Home free of loose throw rugs in walkways, pet beds, electrical cords, etc?: Yes Adequate lighting in your home to reduce risk of falls?: Yes Life alert?: No Use of a cane, walker or w/c?: Yes Grab bars in the bathroom?: No Shower chair or bench in shower?: Yes Elevated toilet seat or a handicapped toilet?: No  TIMED UP AND GO:  Was the test performed?  No  Cognitive Function: 6CIT completed    05/17/2023    9:20 AM 01/23/2018    9:43 AM  MMSE - Mini Mental State Exam  Not completed: Unable to complete   Orientation to time  5  Orientation to Place  5  Registration  3  Attention/ Calculation  5  Recall  3  Language- name 2 objects  2  Language- repeat  1  Language- follow 3 step command  3  Language- read & follow direction  1  Write a sentence  1  Copy design  1  Total score  30        05/17/2023    9:20 AM 04/11/2022   11:58 AM 06/19/2019    1:56 PM 01/23/2018    9:43 AM  6CIT Screen  What Year? 0 points 0 points 0 points 0 points  What month? 0 points 0 points 0 points 0 points  What time? 0 points 0 points 0 points 0 points  Count back from 20 0 points 0 points 0 points 0 points  Months in reverse 0 points 0 points 0 points 0 points  Repeat phrase 0 points 0 points 0 points 0 points  Total Score 0 points 0 points 0 points 0 points    Immunizations Immunization History  Administered Date(s) Administered   Fluad  Quad(high Dose 65+) 10/05/2020, 12/22/2021   Fluad Trivalent(High Dose 65+) 10/06/2022   Influenza,inj,Quad PF,6+ Mos 12/10/2017, 02/18/2019   PFIZER Comirnaty(Gray Top)Covid-19 Tri-Sucrose Vaccine 01/05/2020, 09/10/2020   PFIZER(Purple Top)SARS-COV-2 Vaccination 03/11/2019, 04/01/2019   PNEUMOCOCCAL CONJUGATE-20 02/03/2021   Pfizer(Comirnaty)Fall Seasonal Vaccine 12 years and older 10/06/2022   Pneumococcal Polysaccharide-23 02/18/2019   Tdap 06/24/2014    Screening Tests Health Maintenance  Topic Date Due   Zoster Vaccines- Shingrix (1 of 2) Never done  COVID-19 Vaccine (6 - Pfizer risk 2024-25 season) 04/05/2023   INFLUENZA VACCINE  08/17/2023   Medicare Annual Wellness (AWV)  05/16/2024   DTaP/Tdap/Td (2 - Td or Tdap) 06/23/2024   Pneumonia Vaccine 65+ Years old  Completed   HPV VACCINES  Aged Out   Meningococcal B Vaccine  Aged Out    Health Maintenance  Health Maintenance Due  Topic Date Due   Zoster Vaccines- Shingrix (1 of 2) Never done   COVID-19 Vaccine (6 - Pfizer risk 2024-25 season) 04/05/2023   Health Maintenance Items Addressed: Yes Patient is due for Shingrix and Covid vaccine.  Additional Screening:  Vision Screening: Recommended annual ophthalmology exams for early detection of glaucoma and other disorders of the eye.  Dental Screening: Recommended annual dental exams for proper oral hygiene  Community Resource Referral / Chronic Care Management: CRR required this visit?  No   CCM required this visit?  No     Plan:     I have personally reviewed and noted the following in the patient's chart:   Medical and social history Use of alcohol, tobacco or illicit drugs  Current medications and supplements including opioid prescriptions. Patient is not currently taking opioid prescriptions. Functional ability and status Nutritional status Physical activity Advanced directives List of other physicians Hospitalizations, surgeries, and ER visits in  previous 12 months Vitals Screenings to include cognitive, depression, and falls Referrals and appointments  In addition, I have reviewed and discussed with patient certain preventive protocols, quality metrics, and best practice recommendations. A written personalized care plan for preventive services as well as general preventive health recommendations were provided to patient.     Margette Sheldon, LPN   08/22/5782   After Visit Summary: (MyChart) Due to this being a telephonic visit, the after visit summary with patients personalized plan was offered to patient via MyChart   Notes: Nothing significant to report at this time.

## 2023-05-17 NOTE — Patient Instructions (Addendum)
 Keith [name] , Thank you for taking time to come for your Medicare Wellness Visit. I appreciate your ongoing commitment to your health goals. Please review the following plan we discussed and let me know if I can assist you in the future.   Referrals/Orders/Follow-Ups/Clinician Recommendations: Yes, keep maintaining your health by keeping your appointments with Dr. Dema Filler and any specialists that you may see.  Call us  if you need anything.  Have a great year!!!!  This is a list of the screening recommended for you and due dates:  Health Maintenance  Topic Date Due   Zoster (Shingles) Vaccine (1 of 2) Never done   COVID-19 Vaccine (6 - Pfizer risk 2024-25 season) 04/05/2023   Flu Shot  08/17/2023   Medicare Annual Wellness Visit  05/16/2024   DTaP/Tdap/Td vaccine (2 - Td or Tdap) 06/23/2024   Pneumonia Vaccine  Completed   HPV Vaccine  Aged Out   Meningitis B Vaccine  Aged Out    Advanced directives: (In Chart) A copy of your advanced directives are scanned into your chart should your provider ever need it.  Next Medicare Annual Wellness Visit scheduled for next year: Yes, It was nice speaking with you today! Your next Annual Wellness Visit is scheduled for 05/21/2024 at 9:10 a.m. via PHONE VISIT. If you need to reschedule or cancel, please call [phone]. [facility])

## 2023-05-31 ENCOUNTER — Telehealth: Payer: Self-pay

## 2023-05-31 ENCOUNTER — Ambulatory Visit (INDEPENDENT_AMBULATORY_CARE_PROVIDER_SITE_OTHER): Admitting: Family Medicine

## 2023-05-31 ENCOUNTER — Encounter: Payer: Self-pay | Admitting: Family Medicine

## 2023-05-31 VITALS — BP 125/54 | HR 48 | Ht 69.0 in | Wt 106.1 lb

## 2023-05-31 DIAGNOSIS — C434 Malignant melanoma of scalp and neck: Secondary | ICD-10-CM | POA: Diagnosis not present

## 2023-05-31 DIAGNOSIS — N184 Chronic kidney disease, stage 4 (severe): Secondary | ICD-10-CM | POA: Diagnosis not present

## 2023-05-31 DIAGNOSIS — I951 Orthostatic hypotension: Secondary | ICD-10-CM | POA: Diagnosis not present

## 2023-05-31 DIAGNOSIS — I480 Paroxysmal atrial fibrillation: Secondary | ICD-10-CM

## 2023-05-31 DIAGNOSIS — E611 Iron deficiency: Secondary | ICD-10-CM

## 2023-05-31 NOTE — Patient Instructions (Addendum)
 Be sure to message me on MyChart with all your medications you take, especially looking for midodrine  (PROAMATINE ). This can help me determine how to best manage your blood pressure.  We will take you to the lab and get iron  studies.  Let me know if you have persistent diarrhea after stopping antibiotics.  Come back in 6-12 months for check up or sooner if you need me.

## 2023-05-31 NOTE — Telephone Encounter (Signed)
 Patient calls nurse line in regards to Proamatine .   He reports he is taking this medication.   He reports (1) 2.5mg  tablet once per day.   Advised will forward to PCP.

## 2023-05-31 NOTE — Progress Notes (Signed)
    SUBJECTIVE:   CHIEF COMPLAINT / HPI:   History of transverse colon cancer, malignant melanoma No signs of recurrence based on onc note from Feb 2025. Almost completely cleared up. Had staph infection over it but it was treated well. He has had some diarrhea with this that is light brown.  CKD4, anemia of chronic disease Recent CBC/CMP completed at onc visit in Feb 2025. Hgb and Cr stable. Previous iron  studies with sat 9% in 2023.  Nail dystrophy Healed since last visit.  Lower BP Unsure if taking midodrine . No dizziness. Ambulates well and takes his time with his cane. BP at home runs 140-150s/60s. He will message me if he takes this at home.  PERTINENT  PMH / PSH: continues with ADLs and IADLs and staying active  OBJECTIVE:   BP (!) 125/54   Pulse (!) 48   Ht 5\' 9"  (1.753 m)   Wt 106 lb 2 oz (48.1 kg)   SpO2 99%   BMI 15.67 kg/m   General: Alert and oriented, in NAD Skin: Warm, dry, and intact without lesions HEENT: NCAT, EOM grossly normal, midline nasal septum Cardiac: RRR, no m/r/g appreciated Respiratory: CTAB, breathing and speaking comfortably on RA Abdominal: Soft, nontender, nondistended, normoactive bowel sounds Extremities: Moves all extremities grossly equally Neurological: No gross focal deficit Psychiatric: Appropriate mood and affect   ASSESSMENT/PLAN:   Assessment & Plan Iron  deficiency    Shingles vaccine  Genetta Kenning, MD [facility]

## 2023-06-01 ENCOUNTER — Ambulatory Visit: Payer: Self-pay | Admitting: Family Medicine

## 2023-06-01 LAB — IRON,TIBC AND FERRITIN PANEL
Ferritin: 40 ng/mL (ref 30–400)
Iron Saturation: 19 % (ref 15–55)
Iron: 60 ug/dL (ref 38–169)
Total Iron Binding Capacity: 312 ug/dL (ref 250–450)
UIBC: 252 ug/dL (ref 111–343)

## 2023-06-01 NOTE — Assessment & Plan Note (Addendum)
 Regular rhythm with me today.  Rate is lower though patient without symptoms of orthostasis today.  Continue current regimen for now unless symptoms arise.

## 2023-06-01 NOTE — Assessment & Plan Note (Addendum)
 Again without symptoms today.  Patient called in and stated he takes one 2.5 mg tablet of midodrine  daily to help with blood pressures.  Appears BP is mostly higher at home though overall like a good number for his age.  Continue current regimen unless symptoms arise.

## 2023-06-01 NOTE — Telephone Encounter (Signed)
 Patient returns call to nurse line. Advised of message per Dr. Fredrik Jensen.   Patient has no questions at this time.   Elsie Halo, RN

## 2023-06-01 NOTE — Assessment & Plan Note (Signed)
 Hemoglobin overall stable on recent check in February 2025.  He has been taking iron  supplementation daily.  Will update iron  studies today.

## 2023-06-01 NOTE — Telephone Encounter (Addendum)
 Attempted to call patient to discuss, however no answer.   Detailed VM left in regards to Proamatine .

## 2023-06-01 NOTE — Assessment & Plan Note (Addendum)
 Following with dermatology and appears to be healing well.  He is on antibiotics for superimposed bacterial infection.  Continue antibiotic course per dermatologist.  Advised to let me know if diarrhea does not improve once finished with antibiotic therapy.

## 2023-06-01 NOTE — Assessment & Plan Note (Addendum)
 Labs recently obtained in February 2025 and were stable.  Continue to monitor every 6 to 12 months.

## 2023-06-04 ENCOUNTER — Other Ambulatory Visit: Payer: Self-pay | Admitting: Family Medicine

## 2023-08-03 ENCOUNTER — Other Ambulatory Visit: Payer: Self-pay | Admitting: Family Medicine

## 2023-08-03 DIAGNOSIS — E785 Hyperlipidemia, unspecified: Secondary | ICD-10-CM

## 2023-08-06 NOTE — Telephone Encounter (Signed)
 Lipitor and eliquis  refilled. Patient should be taking eliquis  2.5 mg TWICE daily given his age and kidney function. It was reported he was taking it ONCE per day.   Green team: please call and ensure patient takes eliquis  TWICE daily. Thank you!

## 2023-08-06 NOTE — Telephone Encounter (Signed)
 Spoke to patient's son Carolan Postiglione) and informed him that patient needs to take his Eliquis  twice a day per Dr. Tharon.  Cena JONELLE Pesa, CMA

## 2023-08-10 ENCOUNTER — Emergency Department (HOSPITAL_COMMUNITY)
Admission: EM | Admit: 2023-08-10 | Discharge: 2023-08-10 | Disposition: A | Discharge status: Home / Self Care | Attending: Emergency Medicine | Admitting: Emergency Medicine

## 2023-08-10 ENCOUNTER — Encounter (HOSPITAL_COMMUNITY): Payer: Self-pay | Admitting: Emergency Medicine

## 2023-08-10 ENCOUNTER — Emergency Department (HOSPITAL_COMMUNITY)

## 2023-08-10 ENCOUNTER — Other Ambulatory Visit: Payer: Self-pay

## 2023-08-10 DIAGNOSIS — R197 Diarrhea, unspecified: Secondary | ICD-10-CM | POA: Diagnosis not present

## 2023-08-10 DIAGNOSIS — R109 Unspecified abdominal pain: Secondary | ICD-10-CM | POA: Diagnosis not present

## 2023-08-10 DIAGNOSIS — Z7901 Long term (current) use of anticoagulants: Secondary | ICD-10-CM | POA: Insufficient documentation

## 2023-08-10 DIAGNOSIS — N189 Chronic kidney disease, unspecified: Secondary | ICD-10-CM | POA: Diagnosis not present

## 2023-08-10 DIAGNOSIS — K219 Gastro-esophageal reflux disease without esophagitis: Secondary | ICD-10-CM | POA: Diagnosis not present

## 2023-08-10 DIAGNOSIS — Z85038 Personal history of other malignant neoplasm of large intestine: Secondary | ICD-10-CM | POA: Diagnosis not present

## 2023-08-10 DIAGNOSIS — I700 Atherosclerosis of aorta: Secondary | ICD-10-CM | POA: Insufficient documentation

## 2023-08-10 DIAGNOSIS — R55 Syncope and collapse: Secondary | ICD-10-CM | POA: Diagnosis not present

## 2023-08-10 DIAGNOSIS — R404 Transient alteration of awareness: Secondary | ICD-10-CM | POA: Diagnosis not present

## 2023-08-10 DIAGNOSIS — Z8673 Personal history of transient ischemic attack (TIA), and cerebral infarction without residual deficits: Secondary | ICD-10-CM | POA: Insufficient documentation

## 2023-08-10 DIAGNOSIS — I4510 Unspecified right bundle-branch block: Secondary | ICD-10-CM | POA: Insufficient documentation

## 2023-08-10 LAB — CBC WITH DIFFERENTIAL/PLATELET
Abs Immature Granulocytes: 0.02 K/uL (ref 0.00–0.07)
Basophils Absolute: 0 K/uL (ref 0.0–0.1)
Basophils Relative: 0 %
Eosinophils Absolute: 0.1 K/uL (ref 0.0–0.5)
Eosinophils Relative: 1 %
HCT: 36.1 % — ABNORMAL LOW (ref 39.0–52.0)
Hemoglobin: 11.3 g/dL — ABNORMAL LOW (ref 13.0–17.0)
Immature Granulocytes: 0 %
Lymphocytes Relative: 9 %
Lymphs Abs: 0.7 K/uL (ref 0.7–4.0)
MCH: 30.1 pg (ref 26.0–34.0)
MCHC: 31.3 g/dL (ref 30.0–36.0)
MCV: 96 fL (ref 80.0–100.0)
Monocytes Absolute: 0.5 K/uL (ref 0.1–1.0)
Monocytes Relative: 7 %
Neutro Abs: 6.2 K/uL (ref 1.7–7.7)
Neutrophils Relative %: 83 %
Platelets: 193 K/uL (ref 150–400)
RBC: 3.76 MIL/uL — ABNORMAL LOW (ref 4.22–5.81)
RDW: 12.8 % (ref 11.5–15.5)
WBC: 7.5 K/uL (ref 4.0–10.5)
nRBC: 0 % (ref 0.0–0.2)

## 2023-08-10 LAB — COMPREHENSIVE METABOLIC PANEL WITH GFR
ALT: 7 U/L (ref 0–44)
AST: 14 U/L — ABNORMAL LOW (ref 15–41)
Albumin: 3.1 g/dL — ABNORMAL LOW (ref 3.5–5.0)
Alkaline Phosphatase: 63 U/L (ref 38–126)
Anion gap: 9 (ref 5–15)
BUN: 21 mg/dL (ref 8–23)
CO2: 22 mmol/L (ref 22–32)
Calcium: 8.5 mg/dL — ABNORMAL LOW (ref 8.9–10.3)
Chloride: 105 mmol/L (ref 98–111)
Creatinine, Ser: 2.36 mg/dL — ABNORMAL HIGH (ref 0.61–1.24)
GFR, Estimated: 27 mL/min — ABNORMAL LOW (ref >60)
Glucose, Bld: 107 mg/dL — ABNORMAL HIGH (ref 70–99)
Potassium: 4.4 mmol/L (ref 3.5–5.1)
Sodium: 136 mmol/L (ref 135–145)
Total Bilirubin: 0.7 mg/dL (ref 0.0–1.2)
Total Protein: 7.1 g/dL (ref 6.5–8.1)

## 2023-08-10 LAB — C DIFFICILE QUICK SCREEN W PCR REFLEX
C Diff antigen: NEGATIVE
C Diff interpretation: NOT DETECTED
C Diff toxin: NEGATIVE

## 2023-08-10 LAB — LIPASE, BLOOD: Lipase: 38 U/L (ref 11–51)

## 2023-08-10 MED ORDER — SODIUM CHLORIDE 0.9 % IV BOLUS
500.0000 mL | Freq: Once | INTRAVENOUS | Status: AC
  Administered 2023-08-10: 500 mL via INTRAVENOUS

## 2023-08-10 NOTE — ED Provider Notes (Signed)
 [facility] EMERGENCY DEPARTMENT AT [facility] Provider Note   CSN: [id number] Arrival date & time: 08/10/23  0017     Patient presents with: Diarrhea and Near Syncope   Keith [name] is a 83 y.o. male.   The history is provided by the patient.  Diarrhea Near Syncope  Keith [name] is a 83 y.o. male who presents to the Emergency Department complaining of near syncope.  He presents to the emergency department by EMS for evaluation of a near syncopal episode that occurred when he walked to his bedroom and felt like he was get a pass out.  He got hot and felt generalized weakness.  No nausea, vomiting, chest pain, difficulty breathing.  He has been experiencing diarrhea for 1 week with 3-4 episodes of very loose and dark stools daily.  He gets abdominal pain right before he has a bowel movement.  He had a bowel movement just prior to having his near syncopal episode and his abdominal pain resolved.  He does take Eliquis  for history of atrial fibrillation.  He also has a history of prior CVA, CKD, colon cancer status post resection.  He lives at home alone.     Prior to Admission medications   Medication Sig Start Date End Date Taking? Authorizing Provider  Acetaminophen  Extra Strength 500 MG TABS Take 1 tablet (500 mg total) by mouth every 6 (six) hours as needed. 10/16/22   Theophilus Pagan, MD  atorvastatin  (LIPITOR) 40 MG tablet TAKE ONE TABLET BY MOUTH IN THE MORNING 08/06/23   Tharon Lung, MD  cholecalciferol (VITAMIN D3) 25 MCG (1000 UNIT) tablet Take 1 tablet (1,000 Units total) by mouth every morning. 10/16/22   Theophilus Pagan, MD  Dapsone 5 % topical gel Apply 1 application. topically 2 (two) times a week. Patient not taking: Reported on 05/31/2023 02/08/18   [provider]  ELIQUIS  2.5 MG TABS tablet TAKE ONE TABLET BY MOUTH TWICE DAILY 08/06/23   Tharon Lung, MD  famotidine  (PEPCID ) 20 MG tablet Take 1 tablet (20 mg total) by mouth 2 (two) times  daily. Patient not taking: Reported on 05/31/2023 10/16/22   Theophilus Pagan, MD  Ferrous Sulfate  (IRON  PO) Take 1 tablet by mouth daily.    [provider]  finasteride  (PROSCAR ) 5 MG tablet Take 5 mg by mouth daily.    [provider]  loperamide  (IMODIUM ) 2 MG capsule Take by mouth as needed for diarrhea or loose stools.    [provider]  midodrine  (PROAMATINE ) 2.5 MG tablet Take 1 tablet (2.5 mg total) by mouth daily. 06/05/23   Tharon Lung, MD  timolol  (TIMOPTIC ) 0.5 % ophthalmic solution Place 1 drop into both eyes 2 (two) times daily. 10/16/22   Theophilus Pagan, MD    Allergies: Penicillins    Review of Systems  Cardiovascular:  Positive for near-syncope.  Gastrointestinal:  Positive for diarrhea.  All other systems reviewed and are negative.   Updated Vital Signs BP 128/78 (BP Location: Right Arm)   Pulse (!) 54   Temp 98 F (36.7 C) (Oral)   Resp 16   SpO2 100%   Physical Exam Vitals and nursing note reviewed.  Constitutional:      Appearance: He is well-developed.  HENT:     Head: Normocephalic and atraumatic.  Cardiovascular:     Rate and Rhythm: Normal rate and regular rhythm.     Heart sounds: No murmur heard. Pulmonary:     Effort: Pulmonary effort is normal. No  respiratory distress.     Breath sounds: Normal breath sounds.  Abdominal:     Palpations: Abdomen is soft.     Tenderness: There is no abdominal tenderness. There is no guarding or rebound.  Genitourinary:    Comments: Brown stool in rectal vault Musculoskeletal:        General: No tenderness.  Skin:    General: Skin is warm and dry.  Neurological:     Mental Status: He is alert and oriented to person, place, and time.     Comments: 5 out of 5 strength in all 4 extremities with sensation light touch in all 4 extremities.  Psychiatric:        Behavior: Behavior normal.     (all labs ordered are listed, but only abnormal results are displayed) Labs Reviewed   COMPREHENSIVE METABOLIC PANEL WITH GFR - Abnormal; Notable for the following components:      Result Value   Glucose, Bld 107 (*)    Creatinine, Ser 2.36 (*)    Calcium  8.5 (*)    Albumin  3.1 (*)    AST 14 (*)    GFR, Estimated 27 (*)    All other components within normal limits  CBC WITH DIFFERENTIAL/PLATELET - Abnormal; Notable for the following components:   RBC 3.76 (*)    Hemoglobin 11.3 (*)    HCT 36.1 (*)    All other components within normal limits  C DIFFICILE QUICK SCREEN W PCR REFLEX    GASTROINTESTINAL PANEL BY PCR, STOOL (REPLACES STOOL CULTURE)  LIPASE, BLOOD  URINALYSIS, ROUTINE W REFLEX MICROSCOPIC    EKG: EKG Interpretation Date/Time:  Friday August 10 2023 00:51:36 EDT Ventricular Rate:  58 PR Interval:  190 QRS Duration:  140 QT Interval:  474 QTC Calculation: 466 R Axis:   -55  Text Interpretation: Sinus rhythm RBBB and LAFB Confirmed by Griselda Norris [phone]) on 08/10/2023 1:18:56 AM  Radiology: CT ABDOMEN PELVIS WO CONTRAST Result Date: 08/10/2023 EXAM: CT ABDOMEN AND PELVIS WITHOUT CONTRAST 08/10/2023 03:07:56 AM TECHNIQUE: CT of the abdomen and pelvis was performed without the administration of intravenous contrast. Multiplanar reformatted images are provided for review. Automated exposure control, iterative reconstruction, and/or weight based adjustment of the mA/kV was utilized to reduce the radiation dose to as low as reasonably achievable. COMPARISON: 07/05/2022 CLINICAL HISTORY: Abdominal pain, acute, nonlocalized. On and off diarrhea x 1 week, GFR 27, hx of GERD and melanoma. FINDINGS: LOWER CHEST: No acute abnormality. LIVER: The liver is unremarkable. GALLBLADDER AND BILE DUCTS: Gallbladder is unremarkable. No biliary ductal dilatation. SPLEEN: No acute abnormality. PANCREAS: No acute abnormality. ADRENAL GLANDS: No acute abnormality. KIDNEYS, URETERS AND BLADDER: Bilateral renal cortical scarring/atrophy. No stones in the kidneys or ureters. No  hydronephrosis. No perinephric or periureteral stranding. Urinary bladder is unremarkable. GI AND BOWEL: Status post right hemicolectomy. Dilated loops of small bowel in the central abdomen, leading to the enterocolonic anastomosis in the right mid abdomen, without bowel obstruction or stricture. PERITONEUM AND RETROPERITONEUM: No ascites. No free air. VASCULATURE: Atherosclerotic calcifications of the abdominal aorta and branch vessels. LYMPH NODES: No lymphadenopathy. REPRODUCTIVE ORGANS: Prostate is unremarkable. BONES AND SOFT TISSUES: No acute osseous abnormality. No focal soft tissue abnormality. IMPRESSION: 1. Status post right hemicolectomy. No evidence of bowel obstruction. 2. No acute findings. Electronically signed by: Pinkie Pebbles MD 08/10/2023 03:13 AM EDT RP Workstation: [id number]     Procedures   Medications Ordered in the ED  sodium chloride  0.9 % bolus 500 mL (0 mLs Intravenous  Stopped 08/10/23 0528)                                    Medical Decision Making Amount and/or Complexity of Data Reviewed Labs: ordered. Radiology: ordered.   Patient here for evaluation diarrhea for the last week that is nonbloody, near syncopal episode where he felt hot and generally weak at home.  On examination he has brown stool in the rectal vault, hemoglobin, renal function are stable.  He has mild orthostasis, treated with IV fluids.  He is unsure if he took his midodrine  today.  CT scan is without acute abnormality.  Will send stool studies as he has been on antibiotics in the last few months.  He feels improved after fluids in the department and is able to ambulate with a steady gait.  Discussed with patient, son at the bedside findings of studies.  Patient will have family available to assist him over the weekend.  Plan to have him follow-up closely with family medicine for recheck on Monday.  Discussed return precautions for progressive or new concerning symptoms.  Suspect near syncopal  episode was secondary to dehydration, orthostasis.  Also a contributing factor may be potential missing of midodrine  dose.  Presentation is not consistent with major GI bleed, sepsis, ACS, arrhythmia, PE.     Final diagnoses:  Diarrhea, unspecified type  Near syncope    ED Discharge Orders     None          Griselda Norris, MD 08/10/23 [phone]

## 2023-08-10 NOTE — Discharge Instructions (Addendum)
 PCP follow up scheduled for 7/28 @ 9:30AM

## 2023-08-10 NOTE — ED Triage Notes (Signed)
 Patient BIB EMS from home c/o on and off diarrhea x 1 week. Patient report he got dizziness and near syncope episode tonight. 500ml NS given by EMS. Patient denies any fall. Patient denies N/V. Patient denies fever at home.    BP 124/42 HR 60 RR 20  O2sat 100 % on RA

## 2023-08-11 LAB — GASTROINTESTINAL PANEL BY PCR, STOOL (REPLACES STOOL CULTURE)

## 2023-08-11 NOTE — ED Notes (Signed)
 Late Note 08/11/23 @1335  RN received call from lab that patient has enteropathogenic e coli, MD aware

## 2023-08-13 ENCOUNTER — Ambulatory Visit (INDEPENDENT_AMBULATORY_CARE_PROVIDER_SITE_OTHER)

## 2023-08-13 VITALS — BP 117/73 | HR 52 | Ht 69.0 in | Wt 103.8 lb

## 2023-08-13 DIAGNOSIS — A09 Infectious gastroenteritis and colitis, unspecified: Secondary | ICD-10-CM | POA: Diagnosis not present

## 2023-08-13 NOTE — Progress Notes (Signed)
    SUBJECTIVE:   CHIEF COMPLAINT / HPI: recovering from diarrhea  Had 7+ days of diarrhea. Was seen in [place] ER where stool test was positive for enteropathogenic ecoli, negaitve for C diff. He was given IV fluids. No diarrhea for the last 2 days. Normal BM this morning. No blood. Took and OTC antidiarrheal but uncertain what it was called. Has been trying to eat and drink more normally in the last day.  PERTINENT  PMH / PSH: afib, malnutrition  OBJECTIVE:   BP 117/73   Pulse (!) 52   Wt 103 lb 12.8 oz (47.1 kg)   SpO2 100%   BMI 15.33 kg/m   General: Well appearing and alert patient. No acute distress.  Cardiac: Heart rate is slightly slow and but rhythm is normal. No murmurs, gallops, or rubs are auscultated. S1 and S2 are heard and are of normal intensity. Respiratory: Lung sounds are clear in all lobes bilaterally without rales, ronchi, or wheezes. No increased work of breathing. Abdominal: Abdomen is soft and non-tender without distention. No masses, hepatomegaly, or splenomegaly are noted.   ASSESSMENT/PLAN:   Assessment & Plan Diarrhea of infectious origin Improving - diarrhea now resolved. Encouraged increasing oral intake of food and water. Return precautions discussed.  Will have patient follow up in one month for a weight and nutritional status check.     Keith Rickey Alena Morrison, MD [facility]

## 2023-08-13 NOTE — Patient Instructions (Signed)
 It was wonderful to see you today.  Please bring ALL of your medications with you to every visit.   Today we talked about:  I think you're doing really well today, and recovering from your sickness. Continue to work back to eating and drinking normally.  Follow up with Dr. Tharon at the end of August to recheck your weight and make sure your food and water intake and blood pressure is looking good.  Thank you for choosing [facility].   Please call [phone] with any questions about today's appointment.  Please arrive at least 15 minutes prior to your scheduled appointments.   If you need additional refills before your next appointment, please call your pharmacy first.   [name] Alena Morrison, MD  [facility]

## 2023-08-21 ENCOUNTER — Telehealth: Payer: Self-pay | Admitting: Nurse Practitioner

## 2023-08-21 NOTE — Telephone Encounter (Signed)
 Rescheduled appointments per incoming call. Talked with the patients relative Trish and she is aware of the changes made to the patients upcoming appointments.

## 2023-08-23 ENCOUNTER — Ambulatory Visit: Payer: 59 | Admitting: Nurse Practitioner

## 2023-08-23 ENCOUNTER — Other Ambulatory Visit: Payer: 59

## 2023-09-10 ENCOUNTER — Other Ambulatory Visit: Payer: Self-pay | Admitting: Nurse Practitioner

## 2023-09-10 ENCOUNTER — Other Ambulatory Visit: Payer: Self-pay

## 2023-09-10 DIAGNOSIS — C184 Malignant neoplasm of transverse colon: Secondary | ICD-10-CM

## 2023-09-10 NOTE — Assessment & Plan Note (Signed)
 grade 2, pT3 N0 stage II, MMR normal  -Initially developed rectal bleeding with symptomatic IDA/acute on chronic anemia in 2021, work up colonoscopy 04/2019 by Dr. Charlanne showed a malignant appearing lesion in the transverse colon and multiple polyps, path from the transverse colon mass and the sigmoid polypectomy (complete removal) showed synchronous adenocarcinomas.  -Staging CT CAP were negative for distant metastasis.  -He eventually underwent robot assisted right colectomy 08/06/20; path showed invasive moderately differentiated adenocarcinoma spanning 4 cm invading through muscularis propria, resection margins were negative.  All 18 lymph nodes were negative, this was staged PT3 pN0, with preserved MMR; stage II -On surveillance  -He had complicated post op course, hospitalized 2 months after surgery and had a colonoscopy 09/2020 which was unremarkable. No planned surveillance colonoscopies  -Keith [name] is clinically doing well.  Exam is benign, labs are stable.  Recent surveillance CT from 07/05/2022 which shows no evidence of recurrence -Continue colon cancer surveillance, follow-up in 6 months, or sooner if needed

## 2023-09-10 NOTE — Progress Notes (Unsigned)
 Patient Care Team: Tharon Lung, MD as PCP - General (Family Medicine) Kelsie Agent, MD (Inactive) as PCP - Electrophysiology (Cardiology) Nwobu, Eulas KIDD, MD as Consulting Physician [facility] 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 Charlanne showed a malignant appearing lesion in the transverse colon and multiple polyps, path from the transverse colon mass and the sigmoid polypectomy (complete removal) showed synchronous adenocarcinomas.  -Staging CT CAP were negative for distant metastasis.  -He eventually underwent robot assisted right colectomy 08/06/20; path showed invasive moderately differentiated adenocarcinoma spanning 4 cm invading through muscularis propria, resection margins were negative.  All 18 lymph nodes were negative, this was staged PT3 pN0, with preserved MMR; stage II -On surveillance  -He had complicated post op course, hospitalized 2 months after surgery and had a colonoscopy 09/2020 which was unremarkable. No planned surveillance colonoscopies  -Mr. Marchena is clinically doing well.  Exam is benign, labs are stable.  Recent surveillance CT from 07/05/2022 which shows no evidence of recurrence -Continue colon cancer surveillance, follow-up in 6 months, or sooner if needed   Iron  deficiency  anemia Chronic anemia. Stable CBC with Hgb 11.3 and hct 35.1. iron  36, TIBC 288, sat is 13%, and transferrin 252. Awaiting results of ferritin.  Will treat as clinically needed.  Chronic kidney disease  Stable. Creatinine 2.47, BUN 20, eGFR 26. Managed by nephrology.   Recent gastroenteritis Patient recently treated for e.coli gastroenteritis. Was given round of antibiotics which cleared infection. CT abdomen and pelvis done 08/10/2023 which showed right hemicolectomy. Dilated loops small bowel without evidence of obstruction. No mention of recurrent colon cancer or metastatic disease.  He reports feeling much better after finishing antibiotics.  Appetite is back to normal.  Denies weight loss.  No abdominal pain or cramping.  Is monitored by GI.  Plan Labs reviewed. -Mild and stable anemia with Hgb 11.3 and HCT 35.1. - Iron  panel stable.  Awaiting results of ferritin. - CMP showing stable chronic kidney disease.  Is followed by nephrology. - CEA normal at 2.32. Reviewed recent CT of abdomen pelvis showing no evidence of acute disease.  No mention of cancer recurrence or metastatic disease. Continue colon cancer surveillance. Plan for labs and follow-up in 6 months, sooner if needed.  The patient understands the plans discussed today and is in agreement with them.  He knows to contact our office if he develops concerns prior to his next appointment.  I provided 25 minutes of face-to-face time during this encounter and > 50% was spent counseling as documented under my assessment and plan.    Powell FORBES Lessen, NP  [facility] - A DEPT OF [facility] [address] [place] [address] Dept: [phone] Dept Fax: [phone]   No orders of the defined types were placed in this encounter.     CHIEF COMPLAINT:  CC: Cancer of transverse colon  Current Treatment: Surveillance  INTERVAL HISTORY:  Kenneth is here today  for repeat clinical assessment.  He last had a phone visit with me on 02/23/2023.  States he was recently treated for E. coli infection.  Had rounds of strong antibiotics and is now feeling better.  He is monitored by GI.  States he feels good overall.  He denies chest pain, chest pressure, or shortness of breath. He denies headaches or visual disturbances. He denies abdominal pain, nausea, vomiting, or changes in bowel or bladder habits. He denies fevers or chills.  He denies night sweats or unintentional weight loss.  He denies pain. His appetite is good. His weight has been stable.  I have reviewed the past medical history, past surgical history, social history and family history with the patient and they are unchanged from previous note.  ALLERGIES:  is allergic to penicillins.  MEDICATIONS:  Current Outpatient Medications  Medication Sig Dispense Refill   Acetaminophen  Extra Strength 500 MG TABS Take 1 tablet (500 mg total) by mouth every 6 (six) hours as needed. 360 tablet 3   atorvastatin  (LIPITOR) 40 MG tablet TAKE ONE TABLET BY MOUTH IN THE MORNING 90 tablet 11   cholecalciferol (VITAMIN D3) 25 MCG (1000 UNIT) tablet Take 1 tablet (1,000 Units total) by mouth every morning. 90 tablet 3   Dapsone 5 % topical gel Apply 1 application. topically 2 (two) times a week.     ELIQUIS  2.5 MG TABS tablet TAKE ONE TABLET BY MOUTH TWICE DAILY 180 tablet 11   famotidine  (PEPCID ) 20 MG tablet Take 1 tablet (20 mg total) by mouth 2 (two) times daily. 180 tablet 3   Ferrous Sulfate  (IRON  PO) Take 1 tablet by mouth daily.     finasteride  (PROSCAR ) 5 MG tablet Take 5 mg by mouth daily.     loperamide  (IMODIUM ) 2 MG capsule Take by mouth as needed for diarrhea or loose stools.     midodrine  (PROAMATINE ) 2.5 MG tablet Take 1 tablet (2.5 mg total) by mouth daily. 180 tablet 0   timolol  (TIMOPTIC ) 0.5 % ophthalmic solution Place 1 drop into both eyes 2 (two) times daily. 45 mL 3   No current  facility-administered medications for this visit.    HISTORY OF PRESENT ILLNESS:   Oncology History  Cancer of transverse colon (HCC)  05/22/2019 Initial Diagnosis   Cancer of transverse colon (HCC)   08/06/2020 Pathology Results    Reason for Addendum #1:  DNA Mismatch Repair IHC Results Clinical History: Colon cancer (crm)  FINAL MICROSCOPIC DIAGNOSIS:  A. COLON, RIGHT PROXIMAL, RESECTION: - Invasive adenocarcinoma, moderately differentiated, spanning 4 cm. - Tumor invades through muscularis propria into pericolonic tissue. - Resection margins are negative. - Tubular adenoma (x2). - Benign appendix with fibrous obliteration. - See oncology table.  ONCOLOGY TABLE:  COLON AND RECTUM, CARCINOMA:  Resection, Including Transanal Disk Excision of Rectal Neoplasms  Procedure: Right colon resection Tumor Site: Distal right colon Tumor Size: 4 cm Macroscopic Tumor Perforation: Not identified Histologic Type: Invasive adenocarcinoma Histologic Grade: G2, moderately differentiated Multiple Primary Sites: Not applicable Tumor Extension: Tumor invades through muscularis propria into pericolonic tissue Lymphovascular Invasion: Not identified Perineural Invasion: Not identified Treatment Effect: No known presurgical therapy Margins:      Margin Status for Invasive Carcinoma: All margins negative for invasive carcinoma Regional Lymph Nodes:      Number of Lymph Nodes with Tumor: 0      Number of Lymph Nodes Examined: 18 Tumor Deposits: Not identified  Distant Metastasis:      Distant Site(s) Involved: Not applicable Pathologic Stage Classification (pTNM, AJCC 8th Edition): pT3, pN0 Ancillary Studies: MMR / MSI testing will be ordered. Representative Tumor Block: A3 Comments: None    05/07/2022 Pathology Results    FINAL MICROSCOPIC DIAGNOSIS:   A. SMALL BOWEL, BIOPSY:  - Benign duodenal mucosa.  - No features of sprue or granulomas.   B. STOMACH, BIOPSY:  - Antral and  oxyntic mucosa with mild chronic inflammation.  - Warthin-Starry negative for Helicobacter pylori.  - No dysplasia or carcinoma.  - Small fragment of benign duodenal mucosa.   C. COLON, ASCENDING, POLYPECTOMY:  - Tubular adenoma.  - No high-grade dysplasia or carcinoma.   D. COLON, TRANSVERSE MASS, BIOPSY:  - Adenocarcinoma.  - See comment.   E. COLON, SIGMOID, POLYPECTOMY:  - Adenocarcinoma, moderately differentiated involving a tubular adenoma.  - Adenocarcinoma involves the cauterized margin of the specimen.  - See comment    07/05/2022 Imaging   CT CAP without contrast IMPRESSION: 1. Status post right hemicolectomy without evidence of local recurrence or metastatic disease in the chest, abdomen, or pelvis. 2. Aortic Atherosclerosis (ICD10-I70.0). Coronary artery calcifications. Assessment for potential risk factor modification, dietary therapy or pharmacologic therapy may be warranted, if clinically indicated.   08/10/2023 Imaging   CT abdomen and pelvis without contrast  MPRESSION: 1. Status post right hemicolectomy. No evidence of bowel obstruction. 2. No acute findings.       REVIEW OF SYSTEMS:   Constitutional: Denies fevers, chills or abnormal weight loss. Robust appetite.  Eyes: Denies blurriness of vision Ears, nose, mouth, throat, and face: Denies mucositis or sore throat Respiratory: Denies cough, dyspnea or wheezes Cardiovascular: Denies palpitation, chest discomfort or lower extremity swelling Gastrointestinal:  Denies nausea, heartburn or change in bowel habits. Denies abdominal pain  Skin: Denies abnormal skin rashes Lymphatics: Denies new lymphadenopathy or easy bruising Neurological:Denies numbness, tingling or new weaknesses Behavioral/Psych: Mood is stable, no new changes  All other systems were reviewed with the patient and are negative.   VITALS:   Today's Vitals   09/11/23 0920 09/11/23 0932  BP: (!) 110/50   Pulse: (!) 52   Resp: 17    Temp: 98.1 F (36.7 C)   Weight: 104 lb (47.2 kg)   PainSc:  0-No pain   Body mass index is 15.36 kg/m.   Wt Readings from Last 3 Encounters:  09/11/23 104 lb (47.2 kg)  08/13/23 103 lb 12.8 oz (47.1 kg)  05/31/23 106 lb 2 oz (48.1 kg)    Body mass index is 15.36 kg/m.  Performance status (ECOG): 1 - Symptomatic but completely ambulatory  PHYSICAL EXAM:   GENERAL:alert, no distress and comfortable SKIN: skin color, texture, turgor are normal, no rashes or significant lesions EYES: normal, Conjunctiva are pink and non-injected, sclera clear OROPHARYNX:no exudate, no erythema and lips, buccal mucosa, and tongue normal  NECK: supple, thyroid normal size, non-tender, without nodularity LYMPH:  no palpable lymphadenopathy in the cervical, axillary or inguinal LUNGS: clear to auscultation and percussion with normal breathing effort HEART: regular rate & rhythm and no murmurs and no lower extremity edema ABDOMEN:abdomen soft, non-tender and normal bowel sounds Musculoskeletal:no cyanosis of digits and no clubbing  NEURO: alert & oriented x 3 with fluent speech, no focal motor/sensory deficits  LABORATORY DATA:  I have reviewed the data as listed    Component Value Date/Time   NA 139 09/11/2023 0900   NA 137 02/03/2021 1601   K  4.6 09/11/2023 0900   CL 109 09/11/2023 0900   CO2 26 09/11/2023 0900   GLUCOSE 91 09/11/2023 0900   BUN 20 09/11/2023 0900   BUN 22 02/03/2021 1601   CREATININE 2.47 (H) 09/11/2023 0900   CALCIUM  9.1 09/11/2023 0900   CALCIUM  7.5 (L) 07/11/2010 0400   PROT 6.6 09/11/2023 0900   PROT 7.1 02/03/2021 1601   ALBUMIN  3.6 09/11/2023 0900   ALBUMIN  3.8 02/03/2021 1601   AST 10 (L) 09/11/2023 0900   ALT <5 09/11/2023 0900   ALKPHOS 69 09/11/2023 0900   BILITOT 0.5 09/11/2023 0900   GFRNONAA 26 (L) 09/11/2023 0900   GFRAA 28 (L) 09/26/2019 1313    Lab Results  Component Value Date   WBC 6.3 09/11/2023   NEUTROABS 4.7 09/11/2023   HGB 11.3 (L)  09/11/2023   HCT 35.1 (L) 09/11/2023   MCV 93.1 09/11/2023   PLT 201 09/11/2023

## 2023-09-11 ENCOUNTER — Encounter: Payer: Self-pay | Admitting: Nurse Practitioner

## 2023-09-11 ENCOUNTER — Inpatient Hospital Stay: Attending: Nurse Practitioner

## 2023-09-11 ENCOUNTER — Inpatient Hospital Stay (HOSPITAL_BASED_OUTPATIENT_CLINIC_OR_DEPARTMENT_OTHER): Admitting: Nurse Practitioner

## 2023-09-11 VITALS — BP 110/50 | HR 52 | Temp 98.1°F | Resp 17 | Wt 104.0 lb

## 2023-09-11 DIAGNOSIS — Z08 Encounter for follow-up examination after completed treatment for malignant neoplasm: Secondary | ICD-10-CM | POA: Insufficient documentation

## 2023-09-11 DIAGNOSIS — Z85038 Personal history of other malignant neoplasm of large intestine: Secondary | ICD-10-CM | POA: Insufficient documentation

## 2023-09-11 DIAGNOSIS — C184 Malignant neoplasm of transverse colon: Secondary | ICD-10-CM | POA: Diagnosis not present

## 2023-09-11 DIAGNOSIS — N189 Chronic kidney disease, unspecified: Secondary | ICD-10-CM

## 2023-09-11 DIAGNOSIS — Z8049 Family history of malignant neoplasm of other genital organs: Secondary | ICD-10-CM | POA: Diagnosis not present

## 2023-09-11 DIAGNOSIS — D509 Iron deficiency anemia, unspecified: Secondary | ICD-10-CM | POA: Diagnosis not present

## 2023-09-11 LAB — CMP (CANCER CENTER ONLY)
ALT: <5 U/L (ref 0–44)
AST: 10 U/L — ABNORMAL LOW (ref 15–41)
Albumin: 3.6 g/dL (ref 3.5–5.0)
Alkaline Phosphatase: 69 U/L (ref 38–126)
Anion gap: 4 — ABNORMAL LOW (ref 5–15)
BUN: 20 mg/dL (ref 8–23)
CO2: 26 mmol/L (ref 22–32)
Calcium: 9.1 mg/dL (ref 8.9–10.3)
Chloride: 109 mmol/L (ref 98–111)
Creatinine: 2.47 mg/dL — ABNORMAL HIGH (ref 0.61–1.24)
GFR, Estimated: 26 mL/min — ABNORMAL LOW (ref >60)
Glucose, Bld: 91 mg/dL (ref 70–99)
Potassium: 4.6 mmol/L (ref 3.5–5.1)
Sodium: 139 mmol/L (ref 135–145)
Total Bilirubin: 0.5 mg/dL (ref 0.0–1.2)
Total Protein: 6.6 g/dL (ref 6.5–8.1)

## 2023-09-11 LAB — CBC WITH DIFFERENTIAL (CANCER CENTER ONLY)
Abs Immature Granulocytes: 0.03 K/uL (ref 0.00–0.07)
Basophils Absolute: 0 K/uL (ref 0.0–0.1)
Basophils Relative: 1 %
Eosinophils Absolute: 0.1 K/uL (ref 0.0–0.5)
Eosinophils Relative: 2 %
HCT: 35.1 % — ABNORMAL LOW (ref 39.0–52.0)
Hemoglobin: 11.3 g/dL — ABNORMAL LOW (ref 13.0–17.0)
Immature Granulocytes: 1 %
Lymphocytes Relative: 15 %
Lymphs Abs: 0.9 K/uL (ref 0.7–4.0)
MCH: 30 pg (ref 26.0–34.0)
MCHC: 32.2 g/dL (ref 30.0–36.0)
MCV: 93.1 fL (ref 80.0–100.0)
Monocytes Absolute: 0.6 K/uL (ref 0.1–1.0)
Monocytes Relative: 9 %
Neutro Abs: 4.7 K/uL (ref 1.7–7.7)
Neutrophils Relative %: 72 %
Platelet Count: 201 K/uL (ref 150–400)
RBC: 3.77 MIL/uL — ABNORMAL LOW (ref 4.22–5.81)
RDW: 13.5 % (ref 11.5–15.5)
WBC Count: 6.3 K/uL (ref 4.0–10.5)
nRBC: 0 % (ref 0.0–0.2)

## 2023-09-11 LAB — IRON AND IRON BINDING CAPACITY (CC-WL,HP ONLY)
Iron: 36 ug/dL — ABNORMAL LOW (ref 45–182)
Saturation Ratios: 13 % — ABNORMAL LOW (ref 17.9–39.5)
TIBC: 288 ug/dL (ref 250–450)
UIBC: 252 ug/dL (ref 117–376)

## 2023-09-11 LAB — FERRITIN: Ferritin: 73 ng/mL (ref 24–336)

## 2023-09-11 LAB — CEA (ACCESS): CEA (CHCC): 2.32 ng/mL (ref 0.00–5.00)

## 2023-09-14 ENCOUNTER — Emergency Department (HOSPITAL_COMMUNITY)
Admission: EM | Admit: 2023-09-14 | Discharge: 2023-09-14 | Disposition: A | Discharge status: Home / Self Care | Attending: Emergency Medicine | Admitting: Emergency Medicine

## 2023-09-14 ENCOUNTER — Ambulatory Visit: Payer: Self-pay | Admitting: Family Medicine

## 2023-09-14 ENCOUNTER — Encounter (HOSPITAL_COMMUNITY): Payer: Self-pay

## 2023-09-14 DIAGNOSIS — Z7901 Long term (current) use of anticoagulants: Secondary | ICD-10-CM | POA: Diagnosis not present

## 2023-09-14 DIAGNOSIS — R531 Weakness: Secondary | ICD-10-CM | POA: Diagnosis present

## 2023-09-14 DIAGNOSIS — N179 Acute kidney failure, unspecified: Secondary | ICD-10-CM | POA: Insufficient documentation

## 2023-09-14 DIAGNOSIS — R739 Hyperglycemia, unspecified: Secondary | ICD-10-CM | POA: Diagnosis not present

## 2023-09-14 DIAGNOSIS — R197 Diarrhea, unspecified: Secondary | ICD-10-CM | POA: Insufficient documentation

## 2023-09-14 DIAGNOSIS — Z8673 Personal history of transient ischemic attack (TIA), and cerebral infarction without residual deficits: Secondary | ICD-10-CM | POA: Diagnosis not present

## 2023-09-14 DIAGNOSIS — N289 Disorder of kidney and ureter, unspecified: Secondary | ICD-10-CM | POA: Diagnosis not present

## 2023-09-14 DIAGNOSIS — R110 Nausea: Secondary | ICD-10-CM | POA: Diagnosis not present

## 2023-09-14 DIAGNOSIS — Z23 Encounter for immunization: Secondary | ICD-10-CM | POA: Diagnosis not present

## 2023-09-14 DIAGNOSIS — R1111 Vomiting without nausea: Secondary | ICD-10-CM | POA: Diagnosis not present

## 2023-09-14 DIAGNOSIS — R112 Nausea with vomiting, unspecified: Secondary | ICD-10-CM | POA: Diagnosis not present

## 2023-09-14 DIAGNOSIS — D649 Anemia, unspecified: Secondary | ICD-10-CM | POA: Diagnosis not present

## 2023-09-14 DIAGNOSIS — T148XXA Other injury of unspecified body region, initial encounter: Secondary | ICD-10-CM

## 2023-09-14 DIAGNOSIS — W5501XA Bitten by cat, initial encounter: Secondary | ICD-10-CM | POA: Insufficient documentation

## 2023-09-14 DIAGNOSIS — Z85038 Personal history of other malignant neoplasm of large intestine: Secondary | ICD-10-CM | POA: Diagnosis not present

## 2023-09-14 LAB — COMPREHENSIVE METABOLIC PANEL WITH GFR
ALT: <5 U/L (ref 0–44)
AST: 14 U/L — ABNORMAL LOW (ref 15–41)
Albumin: 3.5 g/dL (ref 3.5–5.0)
Alkaline Phosphatase: 77 U/L (ref 38–126)
Anion gap: 14 (ref 5–15)
BUN: 26 mg/dL — ABNORMAL HIGH (ref 8–23)
CO2: 19 mmol/L — ABNORMAL LOW (ref 22–32)
Calcium: 9.1 mg/dL (ref 8.9–10.3)
Chloride: 105 mmol/L (ref 98–111)
Creatinine, Ser: 2.56 mg/dL — ABNORMAL HIGH (ref 0.61–1.24)
GFR, Estimated: 24 mL/min — ABNORMAL LOW (ref >60)
Glucose, Bld: 139 mg/dL — ABNORMAL HIGH (ref 70–99)
Potassium: 4.7 mmol/L (ref 3.5–5.1)
Sodium: 139 mmol/L (ref 135–145)
Total Bilirubin: 0.7 mg/dL (ref 0.0–1.2)
Total Protein: 6.6 g/dL (ref 6.5–8.1)

## 2023-09-14 LAB — CBC WITH DIFFERENTIAL/PLATELET
Abs Immature Granulocytes: 0.02 K/uL (ref 0.00–0.07)
Basophils Absolute: 0 K/uL (ref 0.0–0.1)
Basophils Relative: 0 %
Eosinophils Absolute: 0 K/uL (ref 0.0–0.5)
Eosinophils Relative: 0 %
HCT: 35.5 % — ABNORMAL LOW (ref 39.0–52.0)
Hemoglobin: 11.2 g/dL — ABNORMAL LOW (ref 13.0–17.0)
Immature Granulocytes: 0 %
Lymphocytes Relative: 4 %
Lymphs Abs: 0.3 K/uL — ABNORMAL LOW (ref 0.7–4.0)
MCH: 29.9 pg (ref 26.0–34.0)
MCHC: 31.5 g/dL (ref 30.0–36.0)
MCV: 94.9 fL (ref 80.0–100.0)
Monocytes Absolute: 0.1 K/uL (ref 0.1–1.0)
Monocytes Relative: 2 %
Neutro Abs: 5.4 K/uL (ref 1.7–7.7)
Neutrophils Relative %: 94 %
Platelets: 162 K/uL (ref 150–400)
RBC: 3.74 MIL/uL — ABNORMAL LOW (ref 4.22–5.81)
RDW: 13.3 % (ref 11.5–15.5)
WBC: 5.8 K/uL (ref 4.0–10.5)
nRBC: 0 % (ref 0.0–0.2)

## 2023-09-14 MED ORDER — SODIUM CHLORIDE 0.9 % IV BOLUS
1000.0000 mL | Freq: Once | INTRAVENOUS | Status: AC
  Administered 2023-09-14: 1000 mL via INTRAVENOUS

## 2023-09-14 MED ORDER — TETANUS-DIPHTH-ACELL PERTUSSIS 5-2.5-18.5 LF-MCG/0.5 IM SUSY
0.5000 mL | PREFILLED_SYRINGE | Freq: Once | INTRAMUSCULAR | Status: AC
  Administered 2023-09-14: 0.5 mL via INTRAMUSCULAR
  Filled 2023-09-14: qty 0.5

## 2023-09-14 MED ORDER — ONDANSETRON HCL 4 MG/2ML IJ SOLN
4.0000 mg | Freq: Once | INTRAMUSCULAR | Status: AC
  Administered 2023-09-14: 4 mg via INTRAVENOUS
  Filled 2023-09-14: qty 2

## 2023-09-14 MED ORDER — ONDANSETRON 4 MG PO TBDP
4.0000 mg | ORAL_TABLET | Freq: Three times a day (TID) | ORAL | 0 refills | Status: AC | PRN
Start: 2023-09-14 — End: ?

## 2023-09-14 NOTE — ED Triage Notes (Signed)
 BIBA from home, woke up with flu like symptoms (chills, body aches below the waist, N/D) bit by a feral cat 3 days ago and says he feels like he's having a stroke  Hx Colon cancer, stroke (2017) AO x4 10/70 HR 93 95% RA 97.8 F 154 CBG

## 2023-09-14 NOTE — Discharge Instructions (Signed)
 If diarrhea starts back, you may take loperamide  (Imodium  A-D) as needed.  Return to the emergency department if you start running a fever or vomiting comes back and is not controlled with ondansetron .  I am concerned about possible rabies exposure.  The cat that bit you needs to be captured and observed for 1 week following the incident.  If the cat does not show signs of rabies at that time, you do not need any treatment.  However, if the cat is not able to be captured and observed, you need to be treated for rabies exposure.  Rabies is 100% preventable when treated before symptoms start, but is almost universally fatal if you wait until you develop symptoms.  You would need to return to the emergency department for rabies vaccine.

## 2023-09-14 NOTE — ED Provider Notes (Signed)
 [facility] AT [facility] Provider Note   CSN: [id number] Arrival date & time: 09/14/23  9468     Patient presents with: Chills and Weakness   Keith [name] is a 83 y.o. male.   The history is provided by the patient.  Weakness  He has history of chronic kidney disease, stroke, GERD and comes in complaining of chills, sweats, vomiting, diarrhea which started last night.  He does not think he was running a fever.  He denies abdominal pain.  He states he has a neighbor who was admitted to the hospital several days ago with similar symptoms.  As a second complaint, he states that several days ago he was bitten by a feral cat.  He states he normally feeds the cat but at this time the cat bit him on his left arm.  He does not know if the cat has been immunized against rabies.  Patient does not know when his last tetanus immunization was.    Prior to Admission medications   Medication Sig Start Date End Date Taking? Authorizing Provider  Acetaminophen  Extra Strength 500 MG TABS Take 1 tablet (500 mg total) by mouth every 6 (six) hours as needed. 10/16/22   Theophilus Pagan, MD  atorvastatin  (LIPITOR) 40 MG tablet TAKE ONE TABLET BY MOUTH IN THE MORNING 08/06/23   Tharon Lung, MD  cholecalciferol (VITAMIN D3) 25 MCG (1000 UNIT) tablet Take 1 tablet (1,000 Units total) by mouth every morning. 10/16/22   Theophilus Pagan, MD  Dapsone 5 % topical gel Apply 1 application. topically 2 (two) times a week. 02/08/18   [provider]  ELIQUIS  2.5 MG TABS tablet TAKE ONE TABLET BY MOUTH TWICE DAILY 08/06/23   Tharon Lung, MD  famotidine  (PEPCID ) 20 MG tablet Take 1 tablet (20 mg total) by mouth 2 (two) times daily. 10/16/22   Theophilus Pagan, MD  Ferrous Sulfate  (IRON  PO) Take 1 tablet by mouth daily.    [provider]  finasteride  (PROSCAR ) 5 MG tablet Take 5 mg by mouth daily.    [provider]  loperamide  (IMODIUM ) 2 MG capsule Take by  mouth as needed for diarrhea or loose stools.    [provider]  midodrine  (PROAMATINE ) 2.5 MG tablet Take 1 tablet (2.5 mg total) by mouth daily. 06/05/23   Tharon Lung, MD  timolol  (TIMOPTIC ) 0.5 % ophthalmic solution Place 1 drop into both eyes 2 (two) times daily. 10/16/22   Theophilus Pagan, MD    Allergies: Penicillins    Review of Systems  Neurological:  Positive for weakness.  All other systems reviewed and are negative.   Updated Vital Signs BP 93/71 (BP Location: Right Arm)   Pulse 97   Temp 99.1 F (37.3 C) (Oral)   Resp 18   SpO2 94%   Physical Exam Vitals and nursing note reviewed.   83 year old male, resting comfortably and in no acute distress. Vital signs are significant for low blood pressure. Oxygen saturation is 94%, which is normal. Head is normocephalic and atraumatic. PERRLA, EOMI. Oropharynx is clear.  Mucous membranes are slightly dry Neck is nontender and supple without adenopathy. Back is nontender and there is no CVA tenderness. Lungs are clear without rales, wheezes, or rhonchi. Chest is nontender. Heart has regular rate and rhythm without murmur. Abdomen is soft, flat, nontender. Extremities: Alexa are present over the dorsum of the left hand which appear more like scratches than bites.  Patient also states that he  was bitten on his left forearm, but I can see no puncture wound or laceration.  There is no erythema or lymphangitic streaks. Skin is warm and dry without rash.  Skin turgor is decreased. Neurologic: Mental status is normal, cranial nerves are intact, moves all extremities equally.   (all labs ordered are listed, but only abnormal results are displayed) Labs Reviewed  COMPREHENSIVE METABOLIC PANEL WITH GFR  CBC WITH DIFFERENTIAL/PLATELET    EKG: None  Radiology: No results found.   Procedures   Medications Ordered in the ED  ondansetron  (ZOFRAN ) injection 4 mg (has no administration in time range)  sodium chloride   0.9 % bolus 1,000 mL (has no administration in time range)  Tdap (BOOSTRIX ) injection 0.5 mL (has no administration in time range)                                    Medical Decision Making Amount and/or Complexity of Data Reviewed Labs: ordered.  Risk Prescription drug management.   Nausea, vomiting, diarrhea with chills and sweats strongly suggestive of viral gastroenteritis.  Doubt ischemic colitis.  Benign abdominal exam, no indication for imaging.  Report of cat bite from a feral cat concerning for possible rabies exposure.  Injuries appear more consistent with scratch, but patient is adamant that he was bitten.  No evidence of cellulitis.  I reviewed his past records, last recorded tetanus immunization was 06/24/2014.  I have ordered a Tdap booster.  I discussed with the patient importance of either capturing the cat for observation or prophylactically initiating rabies vaccination series.  I have ordered IV fluids and screening labs as well as IV ondansetron .  He does much better following above-noted treatment.  Blood pressure is improved to 116 systolic.  I have reviewed his laboratory tests, and my interpretation is stable renal insufficiency, elevated random glucose level, stable anemia, normal WBC but with a left shift.  Glucose will need to be followed as an outpatient.  I have discussed with him need to observe the cat versus empirically treating for possible rabies exposure.  Patient states that he he will try to have the cat captured.  Have explained to him that if the cat is not able to be captured by 1 week following his exposure, he will need to get the rabies vaccination series.  Patient expresses understanding.  I am discharging him with a prescription for ondansetron  oral dissolving tablet, advising him to use over-the-counter loperamide  if diarrhea returns.     Final diagnoses:  Nausea vomiting and diarrhea  Animal bite  Renal insufficiency  Normochromic normocytic  anemia  Elevated random blood glucose level    ED Discharge Orders          Ordered    ondansetron  (ZOFRAN -ODT) 4 MG disintegrating tablet  Every 8 hours PRN        09/14/23 0728               Raford Lenis, MD 09/14/23 [phone]

## 2023-09-19 DIAGNOSIS — Z85820 Personal history of malignant melanoma of skin: Secondary | ICD-10-CM | POA: Diagnosis not present

## 2023-09-19 DIAGNOSIS — L905 Scar conditions and fibrosis of skin: Secondary | ICD-10-CM | POA: Diagnosis not present

## 2023-09-19 DIAGNOSIS — L131 Subcorneal pustular dermatitis: Secondary | ICD-10-CM | POA: Diagnosis not present

## 2023-09-19 DIAGNOSIS — Z85828 Personal history of other malignant neoplasm of skin: Secondary | ICD-10-CM | POA: Diagnosis not present

## 2023-10-03 ENCOUNTER — Other Ambulatory Visit: Payer: Self-pay | Admitting: Family Medicine

## 2023-10-03 DIAGNOSIS — K449 Diaphragmatic hernia without obstruction or gangrene: Secondary | ICD-10-CM

## 2023-10-03 DIAGNOSIS — K219 Gastro-esophageal reflux disease without esophagitis: Secondary | ICD-10-CM

## 2023-10-22 ENCOUNTER — Ambulatory Visit (INDEPENDENT_AMBULATORY_CARE_PROVIDER_SITE_OTHER): Admitting: Family Medicine

## 2023-10-22 VITALS — BP 115/74 | HR 53 | Ht 69.0 in | Wt 100.0 lb

## 2023-10-22 DIAGNOSIS — A084 Viral intestinal infection, unspecified: Secondary | ICD-10-CM | POA: Diagnosis not present

## 2023-10-22 NOTE — Progress Notes (Signed)
    SUBJECTIVE:   CHIEF COMPLAINT / HPI:   Check up after sickness Went to ED at end of August due to explosive diarrhea and shaking. No blood in the stool, but it was really dark once. They sent him home, but he had to call again due to difficulty functioning and completing ADLs. After the second visit, he was sent home again. He feels 90% better now, but he is unsure what happened. He had no sick contacts at this time, though a neighbor may have been sick around then. Has been eating and drinking well now. Better sleep now. He continues to have some diarrhea though today it was solid. Urinating normally. Has also been on eliquis  and iron  pills.  PERTINENT  PMH / PSH: PAF, CAD, CVA, GERD, colon cancer, CKD  OBJECTIVE:   BP 115/74   Pulse (!) 53   Ht 5' 9 (1.753 m)   Wt 100 lb (45.4 kg)   SpO2 100%   BMI 14.77 kg/m   General: Alert and oriented, in NAD Skin: Warm, dry, and intact HEENT: NCAT, EOM grossly normal, midline nasal septum Cardiac: Bradycardic, regular rhythm, no m/r/g appreciated Respiratory: CTAB, breathing and speaking comfortably on RA Extremities: Moves all extremities grossly equally Neurological: No gross focal deficit Psychiatric: Appropriate mood and affect   ASSESSMENT/PLAN:   Assessment & Plan Viral gastroenteritis Most likely etiology of his symptoms above. Reassured he is nearly back to baseline. He is bradycardic though this is chronic and he denies symptoms. He will let me know if he does not continue to improve.   Stuart Redo, MD [facility]

## 2023-10-22 NOTE — Patient Instructions (Signed)
 You are improving well. Let me know if you do not continue to improve.

## 2023-10-25 DIAGNOSIS — N184 Chronic kidney disease, stage 4 (severe): Secondary | ICD-10-CM | POA: Diagnosis not present

## 2023-10-29 DIAGNOSIS — H0102B Squamous blepharitis left eye, upper and lower eyelids: Secondary | ICD-10-CM | POA: Diagnosis not present

## 2023-10-29 DIAGNOSIS — Z961 Presence of intraocular lens: Secondary | ICD-10-CM | POA: Diagnosis not present

## 2023-10-29 DIAGNOSIS — H0102A Squamous blepharitis right eye, upper and lower eyelids: Secondary | ICD-10-CM | POA: Diagnosis not present

## 2023-10-29 DIAGNOSIS — H401133 Primary open-angle glaucoma, bilateral, severe stage: Secondary | ICD-10-CM | POA: Diagnosis not present

## 2023-10-30 ENCOUNTER — Other Ambulatory Visit: Payer: Self-pay | Admitting: Family Medicine

## 2023-11-01 DIAGNOSIS — D631 Anemia in chronic kidney disease: Secondary | ICD-10-CM | POA: Diagnosis not present

## 2023-11-01 DIAGNOSIS — Z8673 Personal history of transient ischemic attack (TIA), and cerebral infarction without residual deficits: Secondary | ICD-10-CM | POA: Diagnosis not present

## 2023-11-01 DIAGNOSIS — N184 Chronic kidney disease, stage 4 (severe): Secondary | ICD-10-CM | POA: Diagnosis not present

## 2024-03-14 ENCOUNTER — Other Ambulatory Visit

## 2024-03-14 ENCOUNTER — Ambulatory Visit: Admitting: Nurse Practitioner

## 2024-05-19 ENCOUNTER — Encounter
